# Patient Record
Sex: Male | Born: 1935 | Race: White | Hispanic: No | Marital: Married | Smoking: Former smoker
Health system: Southern US, Community
[De-identification: ages and names within clinical notes are randomized; demographics above are authoritative.]

## PROBLEM LIST (undated history)

## (undated) DIAGNOSIS — I10 Essential (primary) hypertension: Secondary | ICD-10-CM

## (undated) DIAGNOSIS — N183 Chronic kidney disease, stage 3 unspecified: Secondary | ICD-10-CM

## (undated) DIAGNOSIS — I350 Nonrheumatic aortic (valve) stenosis: Secondary | ICD-10-CM

## (undated) DIAGNOSIS — K219 Gastro-esophageal reflux disease without esophagitis: Secondary | ICD-10-CM

## (undated) DIAGNOSIS — R220 Localized swelling, mass and lump, head: Secondary | ICD-10-CM

## (undated) DIAGNOSIS — C4490 Unspecified malignant neoplasm of skin, unspecified: Secondary | ICD-10-CM

## (undated) DIAGNOSIS — I5030 Unspecified diastolic (congestive) heart failure: Secondary | ICD-10-CM

## (undated) DIAGNOSIS — I6529 Occlusion and stenosis of unspecified carotid artery: Secondary | ICD-10-CM

## (undated) DIAGNOSIS — L570 Actinic keratosis: Secondary | ICD-10-CM

## (undated) DIAGNOSIS — G459 Transient cerebral ischemic attack, unspecified: Secondary | ICD-10-CM

## (undated) DIAGNOSIS — Z8619 Personal history of other infectious and parasitic diseases: Secondary | ICD-10-CM

## (undated) DIAGNOSIS — C9110 Chronic lymphocytic leukemia of B-cell type not having achieved remission: Secondary | ICD-10-CM

## (undated) DIAGNOSIS — I2510 Atherosclerotic heart disease of native coronary artery without angina pectoris: Secondary | ICD-10-CM

## (undated) DIAGNOSIS — I639 Cerebral infarction, unspecified: Secondary | ICD-10-CM

## (undated) DIAGNOSIS — Z8673 Personal history of transient ischemic attack (TIA), and cerebral infarction without residual deficits: Secondary | ICD-10-CM

## (undated) DIAGNOSIS — D696 Thrombocytopenia, unspecified: Secondary | ICD-10-CM

## (undated) DIAGNOSIS — E785 Hyperlipidemia, unspecified: Secondary | ICD-10-CM

## (undated) HISTORY — DX: Chronic lymphocytic leukemia of B-cell type not having achieved remission: C91.10

## (undated) HISTORY — DX: Transient cerebral ischemic attack, unspecified: G45.9

## (undated) HISTORY — DX: Gastro-esophageal reflux disease without esophagitis: K21.9

## (undated) HISTORY — DX: Atherosclerotic heart disease of native coronary artery without angina pectoris: I25.10

## (undated) HISTORY — DX: Chronic kidney disease, stage 3 (moderate): N18.3

## (undated) HISTORY — DX: Essential (primary) hypertension: I10

## (undated) HISTORY — DX: Thrombocytopenia, unspecified: D69.6

## (undated) HISTORY — DX: Chronic kidney disease, stage 3 unspecified: N18.30

## (undated) HISTORY — DX: Nonrheumatic aortic (valve) stenosis: I35.0

## (undated) HISTORY — DX: Unspecified malignant neoplasm of skin, unspecified: C44.90

## (undated) HISTORY — DX: Hyperlipidemia, unspecified: E78.5

## (undated) HISTORY — DX: Personal history of other infectious and parasitic diseases: Z86.19

## (undated) HISTORY — PX: CATARACT EXTRACTION, BILATERAL: SHX1313

## (undated) HISTORY — DX: Localized swelling, mass and lump, head: R22.0

## (undated) HISTORY — DX: Cerebral infarction, unspecified: I63.9

## (undated) HISTORY — DX: Personal history of transient ischemic attack (TIA), and cerebral infarction without residual deficits: Z86.73

## (undated) HISTORY — DX: Actinic keratosis: L57.0

## (undated) HISTORY — DX: Occlusion and stenosis of unspecified carotid artery: I65.29

## (undated) HISTORY — DX: Unspecified diastolic (congestive) heart failure: I50.30

## (undated) HISTORY — PX: LITHOTRIPSY: SUR834

---
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
## 1975-04-02 HISTORY — PX: KIDNEY STONE SURGERY: SHX686

## 1987-11-29 HISTORY — PX: ESOPHAGOGASTRODUODENOSCOPY: SHX1529

## 1987-11-29 HISTORY — PX: COLONOSCOPY: SHX174

## 1996-04-01 HISTORY — PX: ANGIOPLASTY: SHX39

## 1996-04-01 HISTORY — PX: CORONARY ANGIOPLASTY: SHX604

## 2000-04-01 DIAGNOSIS — I10 Essential (primary) hypertension: Secondary | ICD-10-CM

## 2000-04-01 DIAGNOSIS — E785 Hyperlipidemia, unspecified: Secondary | ICD-10-CM

## 2000-04-01 HISTORY — DX: Hyperlipidemia, unspecified: E78.5

## 2000-04-01 HISTORY — DX: Essential (primary) hypertension: I10

## 2000-04-04 ENCOUNTER — Encounter: Payer: Self-pay | Admitting: Family Medicine

## 2000-04-04 LAB — CONVERTED CEMR LAB
Blood Glucose, Fasting: 132 mg/dL
PSA: 0.65 ng/mL

## 2000-06-12 ENCOUNTER — Encounter (INDEPENDENT_AMBULATORY_CARE_PROVIDER_SITE_OTHER): Payer: Self-pay | Admitting: Specialist

## 2000-06-12 ENCOUNTER — Ambulatory Visit (HOSPITAL_BASED_OUTPATIENT_CLINIC_OR_DEPARTMENT_OTHER): Admission: RE | Admit: 2000-06-12 | Discharge: 2000-06-12 | Payer: Self-pay | Admitting: *Deleted

## 2001-04-09 ENCOUNTER — Encounter: Payer: Self-pay | Admitting: Family Medicine

## 2001-04-09 LAB — CONVERTED CEMR LAB: Blood Glucose, Fasting: 105 mg/dL

## 2001-04-14 ENCOUNTER — Ambulatory Visit (HOSPITAL_COMMUNITY): Admission: RE | Admit: 2001-04-14 | Discharge: 2001-04-14 | Payer: Self-pay | Admitting: *Deleted

## 2001-04-17 ENCOUNTER — Encounter: Payer: Self-pay | Admitting: Cardiothoracic Surgery

## 2001-04-21 ENCOUNTER — Encounter: Payer: Self-pay | Admitting: Cardiothoracic Surgery

## 2001-04-21 ENCOUNTER — Inpatient Hospital Stay (HOSPITAL_COMMUNITY): Admission: RE | Admit: 2001-04-21 | Discharge: 2001-04-25 | Payer: Self-pay | Admitting: Cardiothoracic Surgery

## 2001-04-22 ENCOUNTER — Encounter: Payer: Self-pay | Admitting: Cardiothoracic Surgery

## 2001-04-23 ENCOUNTER — Encounter: Payer: Self-pay | Admitting: Cardiothoracic Surgery

## 2001-04-23 HISTORY — PX: CORONARY ARTERY BYPASS GRAFT: SHX141

## 2001-05-26 ENCOUNTER — Encounter (HOSPITAL_COMMUNITY): Admission: RE | Admit: 2001-05-26 | Discharge: 2001-08-24 | Payer: Self-pay | Admitting: *Deleted

## 2001-08-12 ENCOUNTER — Encounter: Payer: Self-pay | Admitting: Family Medicine

## 2001-08-12 LAB — CONVERTED CEMR LAB
PSA: 1.2 ng/mL
TSH: 1.9 microintl units/mL

## 2002-03-18 ENCOUNTER — Encounter: Payer: Self-pay | Admitting: Family Medicine

## 2002-03-18 LAB — CONVERTED CEMR LAB: Blood Glucose, Fasting: 87 mg/dL

## 2002-04-02 ENCOUNTER — Encounter: Payer: Self-pay | Admitting: Family Medicine

## 2002-04-02 LAB — CONVERTED CEMR LAB: Blood Glucose, Fasting: 92 mg/dL

## 2002-11-02 ENCOUNTER — Encounter: Payer: Self-pay | Admitting: Family Medicine

## 2002-11-02 LAB — CONVERTED CEMR LAB
Blood Glucose, Fasting: 109 mg/dL
PSA: 0.6 ng/mL
TSH: 1.9 microintl units/mL

## 2003-02-07 HISTORY — PX: COLONOSCOPY: SHX174

## 2003-10-31 ENCOUNTER — Encounter: Payer: Self-pay | Admitting: Family Medicine

## 2003-10-31 LAB — CONVERTED CEMR LAB
Blood Glucose, Fasting: 106 mg/dL
PSA: 0.4 ng/mL
TSH: 2.47 microintl units/mL

## 2004-04-30 ENCOUNTER — Ambulatory Visit: Payer: Self-pay | Admitting: Family Medicine

## 2004-04-30 LAB — CONVERTED CEMR LAB
PSA: 0.54 ng/mL
TSH: 2.82 microintl units/mL

## 2004-05-03 ENCOUNTER — Ambulatory Visit: Payer: Self-pay | Admitting: Family Medicine

## 2004-05-30 ENCOUNTER — Ambulatory Visit: Payer: Self-pay | Admitting: Family Medicine

## 2004-10-30 ENCOUNTER — Ambulatory Visit: Payer: Self-pay | Admitting: Family Medicine

## 2004-11-01 ENCOUNTER — Ambulatory Visit: Payer: Self-pay | Admitting: Family Medicine

## 2004-11-13 ENCOUNTER — Ambulatory Visit: Payer: Self-pay | Admitting: Internal Medicine

## 2004-11-26 ENCOUNTER — Ambulatory Visit: Payer: Self-pay | Admitting: Family Medicine

## 2004-12-12 ENCOUNTER — Ambulatory Visit: Payer: Self-pay | Admitting: Internal Medicine

## 2004-12-12 ENCOUNTER — Encounter (INDEPENDENT_AMBULATORY_CARE_PROVIDER_SITE_OTHER): Payer: Self-pay | Admitting: *Deleted

## 2004-12-12 HISTORY — PX: COLONOSCOPY: SHX174

## 2004-12-12 LAB — HM PAP SMEAR

## 2005-01-04 ENCOUNTER — Ambulatory Visit: Payer: Self-pay | Admitting: Family Medicine

## 2005-01-30 ENCOUNTER — Ambulatory Visit: Payer: Self-pay | Admitting: Family Medicine

## 2005-02-01 ENCOUNTER — Ambulatory Visit: Payer: Self-pay | Admitting: Family Medicine

## 2005-02-01 LAB — CONVERTED CEMR LAB: Blood Glucose, Fasting: 104 mg/dL

## 2005-04-02 ENCOUNTER — Ambulatory Visit: Payer: Self-pay | Admitting: Family Medicine

## 2005-05-02 ENCOUNTER — Ambulatory Visit: Payer: Self-pay | Admitting: Family Medicine

## 2005-11-29 ENCOUNTER — Ambulatory Visit: Payer: Self-pay | Admitting: Family Medicine

## 2005-11-29 LAB — CONVERTED CEMR LAB
Blood Glucose, Fasting: 107 mg/dL
PSA: 0.46 ng/mL
TSH: 3.34 microintl units/mL

## 2005-12-04 ENCOUNTER — Ambulatory Visit: Payer: Self-pay | Admitting: Family Medicine

## 2005-12-16 ENCOUNTER — Ambulatory Visit: Payer: Self-pay | Admitting: Family Medicine

## 2005-12-17 ENCOUNTER — Ambulatory Visit: Payer: Self-pay | Admitting: Cardiology

## 2006-01-08 ENCOUNTER — Ambulatory Visit: Payer: Self-pay | Admitting: Family Medicine

## 2006-02-06 ENCOUNTER — Ambulatory Visit: Payer: Self-pay

## 2006-08-12 ENCOUNTER — Telehealth (INDEPENDENT_AMBULATORY_CARE_PROVIDER_SITE_OTHER): Payer: Self-pay | Admitting: *Deleted

## 2006-09-19 ENCOUNTER — Ambulatory Visit: Payer: Self-pay | Admitting: Family Medicine

## 2006-10-09 ENCOUNTER — Ambulatory Visit: Payer: Self-pay | Admitting: Family Medicine

## 2006-12-03 ENCOUNTER — Ambulatory Visit: Payer: Self-pay | Admitting: Internal Medicine

## 2006-12-04 ENCOUNTER — Encounter: Payer: Self-pay | Admitting: Family Medicine

## 2006-12-04 DIAGNOSIS — K6289 Other specified diseases of anus and rectum: Secondary | ICD-10-CM | POA: Insufficient documentation

## 2006-12-04 DIAGNOSIS — I10 Essential (primary) hypertension: Secondary | ICD-10-CM | POA: Insufficient documentation

## 2006-12-04 DIAGNOSIS — I2510 Atherosclerotic heart disease of native coronary artery without angina pectoris: Secondary | ICD-10-CM | POA: Insufficient documentation

## 2006-12-04 DIAGNOSIS — K219 Gastro-esophageal reflux disease without esophagitis: Secondary | ICD-10-CM | POA: Insufficient documentation

## 2006-12-04 DIAGNOSIS — E782 Mixed hyperlipidemia: Secondary | ICD-10-CM | POA: Insufficient documentation

## 2006-12-04 DIAGNOSIS — A6000 Herpesviral infection of urogenital system, unspecified: Secondary | ICD-10-CM | POA: Insufficient documentation

## 2006-12-04 DIAGNOSIS — Z87442 Personal history of urinary calculi: Secondary | ICD-10-CM | POA: Insufficient documentation

## 2006-12-04 DIAGNOSIS — R7303 Prediabetes: Secondary | ICD-10-CM | POA: Insufficient documentation

## 2006-12-04 LAB — CONVERTED CEMR LAB: Blood Glucose, Fasting: 118 mg/dL

## 2006-12-10 HISTORY — PX: OTHER SURGICAL HISTORY: SHX169

## 2006-12-11 ENCOUNTER — Encounter: Payer: Self-pay | Admitting: Family Medicine

## 2006-12-11 ENCOUNTER — Ambulatory Visit: Payer: Self-pay

## 2006-12-12 ENCOUNTER — Ambulatory Visit: Payer: Self-pay | Admitting: Family Medicine

## 2006-12-12 LAB — CONVERTED CEMR LAB
ALT: 23 units/L (ref 0–53)
AST: 30 units/L (ref 0–37)
Albumin: 3.8 g/dL (ref 3.5–5.2)
Alkaline Phosphatase: 94 units/L (ref 39–117)
BUN: 15 mg/dL (ref 6–23)
Basophils Absolute: 0 10*3/uL (ref 0.0–0.1)
Basophils Relative: 0 % (ref 0.0–1.0)
Bilirubin, Direct: 0.2 mg/dL (ref 0.0–0.3)
CO2: 32 meq/L (ref 19–32)
Calcium: 9.5 mg/dL (ref 8.4–10.5)
Chloride: 108 meq/L (ref 96–112)
Cholesterol: 84 mg/dL (ref 0–200)
Creatinine, Ser: 1.4 mg/dL (ref 0.4–1.5)
Creatinine,U: 184.6 mg/dL
Eosinophils Absolute: 0.3 10*3/uL (ref 0.0–0.6)
Eosinophils Relative: 1.9 % (ref 0.0–5.0)
GFR calc Af Amer: 64 mL/min
GFR calc non Af Amer: 53 mL/min
Glucose, Bld: 98 mg/dL (ref 70–99)
HCT: 40 % (ref 39.0–52.0)
HDL: 22.5 mg/dL — ABNORMAL LOW (ref >39.0)
Hemoglobin: 13.9 g/dL (ref 13.0–17.0)
LDL Cholesterol: 46 mg/dL (ref 0–99)
Lymphocytes Relative: 69.1 % — ABNORMAL HIGH (ref 12.0–46.0)
MCHC: 34.8 g/dL (ref 30.0–36.0)
MCV: 95.8 fL (ref 78.0–100.0)
Microalb Creat Ratio: 9.2 mg/g (ref 0.0–30.0)
Microalb, Ur: 1.7 mg/dL (ref 0.0–1.9)
Monocytes Absolute: 1.1 10*3/uL — ABNORMAL HIGH (ref 0.2–0.7)
Monocytes Relative: 6.4 % (ref 3.0–11.0)
Neutro Abs: 3.8 10*3/uL (ref 1.4–7.7)
Neutrophils Relative %: 22.6 % — ABNORMAL LOW (ref 43.0–77.0)
PSA: 0.52 ng/mL (ref 0.10–4.00)
Platelets: 106 10*3/uL — ABNORMAL LOW (ref 150–400)
Potassium: 4.7 meq/L (ref 3.5–5.1)
RBC: 4.18 M/uL — ABNORMAL LOW (ref 4.22–5.81)
RDW: 13.5 % (ref 11.5–14.6)
Sodium: 144 meq/L (ref 135–145)
TSH: 3.17 microintl units/mL (ref 0.35–5.50)
Total Bilirubin: 1.1 mg/dL (ref 0.3–1.2)
Total CHOL/HDL Ratio: 3.7
Total Protein: 6.9 g/dL (ref 6.0–8.3)
Triglycerides: 80 mg/dL (ref 0–149)
VLDL: 16 mg/dL (ref 0–40)
WBC: 16.8 10*3/uL — ABNORMAL HIGH (ref 4.5–10.5)

## 2006-12-16 ENCOUNTER — Ambulatory Visit: Payer: Self-pay | Admitting: Family Medicine

## 2006-12-16 DIAGNOSIS — L219 Seborrheic dermatitis, unspecified: Secondary | ICD-10-CM | POA: Insufficient documentation

## 2007-01-02 ENCOUNTER — Telehealth (INDEPENDENT_AMBULATORY_CARE_PROVIDER_SITE_OTHER): Payer: Self-pay | Admitting: *Deleted

## 2007-01-05 ENCOUNTER — Encounter: Payer: Self-pay | Admitting: Family Medicine

## 2007-01-16 ENCOUNTER — Ambulatory Visit: Payer: Self-pay | Admitting: Family Medicine

## 2007-01-19 ENCOUNTER — Telehealth: Payer: Self-pay | Admitting: Family Medicine

## 2007-01-28 ENCOUNTER — Ambulatory Visit: Payer: Self-pay | Admitting: Family Medicine

## 2007-01-29 ENCOUNTER — Encounter (INDEPENDENT_AMBULATORY_CARE_PROVIDER_SITE_OTHER): Payer: Self-pay | Admitting: *Deleted

## 2007-02-05 ENCOUNTER — Ambulatory Visit: Payer: Self-pay | Admitting: Family Medicine

## 2007-03-10 ENCOUNTER — Ambulatory Visit: Payer: Self-pay | Admitting: Family Medicine

## 2007-03-11 LAB — CONVERTED CEMR LAB
Basophils Absolute: 0.1 10*3/uL (ref 0.0–0.1)
Basophils Relative: 0.3 % (ref 0.0–1.0)
Eosinophils Absolute: 0.4 10*3/uL (ref 0.0–0.6)
Eosinophils Relative: 1.7 % (ref 0.0–5.0)
HCT: 40.4 % (ref 39.0–52.0)
Hemoglobin: 14 g/dL (ref 13.0–17.0)
Lymphocytes Relative: 75.2 % — ABNORMAL HIGH (ref 12.0–46.0)
MCHC: 34.6 g/dL (ref 30.0–36.0)
MCV: 98.5 fL (ref 78.0–100.0)
Monocytes Absolute: 1.4 10*3/uL — ABNORMAL HIGH (ref 0.2–0.7)
Monocytes Relative: 6 % (ref 3.0–11.0)
Neutro Abs: 4 10*3/uL (ref 1.4–7.7)
Neutrophils Relative %: 16.8 % — ABNORMAL LOW (ref 43.0–77.0)
Platelets: 103 10*3/uL — ABNORMAL LOW (ref 150–400)
RBC: 4.1 M/uL — ABNORMAL LOW (ref 4.22–5.81)
RDW: 13.6 % (ref 11.5–14.6)
WBC: 23.7 10*3/uL (ref 4.5–10.5)

## 2007-03-18 ENCOUNTER — Ambulatory Visit: Payer: Self-pay | Admitting: Family Medicine

## 2007-03-19 ENCOUNTER — Ambulatory Visit: Payer: Self-pay | Admitting: Oncology

## 2007-04-02 DIAGNOSIS — C9110 Chronic lymphocytic leukemia of B-cell type not having achieved remission: Secondary | ICD-10-CM

## 2007-04-02 HISTORY — DX: Chronic lymphocytic leukemia of B-cell type not having achieved remission: C91.10

## 2007-04-10 ENCOUNTER — Encounter (INDEPENDENT_AMBULATORY_CARE_PROVIDER_SITE_OTHER): Payer: Self-pay | Admitting: Hematology and Oncology

## 2007-04-10 ENCOUNTER — Other Ambulatory Visit: Admission: RE | Admit: 2007-04-10 | Discharge: 2007-04-10 | Payer: Self-pay | Admitting: Hematology and Oncology

## 2007-04-10 LAB — COMPREHENSIVE METABOLIC PANEL
ALT: 20 U/L (ref 0–53)
AST: 23 U/L (ref 0–37)
Albumin: 4.5 g/dL (ref 3.5–5.2)
Alkaline Phosphatase: 109 U/L (ref 39–117)
BUN: 16 mg/dL (ref 6–23)
CO2: 29 mEq/L (ref 19–32)
Calcium: 9.7 mg/dL (ref 8.4–10.5)
Chloride: 104 mEq/L (ref 96–112)
Creatinine, Ser: 1.32 mg/dL (ref 0.40–1.50)
Glucose, Bld: 97 mg/dL (ref 70–99)
Potassium: 5 mEq/L (ref 3.5–5.3)
Sodium: 140 mEq/L (ref 135–145)
Total Bilirubin: 0.8 mg/dL (ref 0.3–1.2)
Total Protein: 7.3 g/dL (ref 6.0–8.3)

## 2007-04-10 LAB — MANUAL DIFFERENTIAL (CHCC SATELLITE)
ALC: 20.4 10*3/uL — ABNORMAL HIGH (ref 0.9–3.3)
ANC (CHCC HP manual diff): 2.4 10*3/uL (ref 1.5–6.5)
Eos: 1 % (ref 0–7)
LYMPH: 77 % — ABNORMAL HIGH (ref 14–48)
MONO: 4 % (ref 0–13)
PLT EST ~~LOC~~: DECREASED
Platelet Morphology: NORMAL
RBC Comments: NORMAL
SEG: 10 % — ABNORMAL LOW (ref 40–75)
Variant Lymph: 8 % — ABNORMAL HIGH (ref 0–0)

## 2007-04-10 LAB — CBC WITH DIFFERENTIAL (CANCER CENTER ONLY)
HCT: 42.8 % (ref 38.7–49.9)
HGB: 14.4 g/dL (ref 13.0–17.1)
MCH: 32.5 pg (ref 28.0–33.4)
MCHC: 33.7 g/dL (ref 32.0–35.9)
MCV: 96 fL (ref 82–98)
Platelets: 89 10*3/uL — ABNORMAL LOW (ref 145–400)
RBC: 4.44 10*6/uL (ref 4.20–5.70)
RDW: 12.4 % (ref 10.5–14.6)
WBC: 24 10*3/uL — ABNORMAL HIGH (ref 4.0–10.0)

## 2007-04-10 LAB — LACTATE DEHYDROGENASE: LDH: 207 U/L (ref 94–250)

## 2007-04-14 LAB — PROTEIN ELECTROPHORESIS, SERUM
Albumin ELP: 58.2 % (ref 55.8–66.1)
Alpha-1-Globulin: 4 % (ref 2.9–4.9)
Alpha-2-Globulin: 10.3 % (ref 7.1–11.8)
Beta 2: 4.4 % (ref 3.2–6.5)
Beta Globulin: 6.1 % (ref 4.7–7.2)
Gamma Globulin: 17 % (ref 11.1–18.8)
Total Protein, Serum Electrophoresis: 8.4 g/dL — ABNORMAL HIGH (ref 6.0–8.3)

## 2007-04-16 LAB — IMMUNOFIXATION ELECTROPHORESIS
IgA: 191 mg/dL (ref 68–378)
IgG (Immunoglobin G), Serum: 1420 mg/dL (ref 694–1618)
IgM, Serum: 137 mg/dL (ref 60–263)
Total Protein, Serum Electrophoresis: 8 g/dL (ref 6.0–8.3)

## 2007-04-17 LAB — ZAP-70 - CHCC SATELLITE

## 2007-04-17 LAB — FLOW CYTOMETRY - CHCC SATELLITE

## 2007-04-29 LAB — MANUAL DIFFERENTIAL (CHCC SATELLITE)
ALC: 21.4 10*3/uL — ABNORMAL HIGH (ref 0.9–3.3)
ANC (CHCC HP manual diff): 3.1 10*3/uL (ref 1.5–6.5)
Eos: 1 % (ref 0–7)
LYMPH: 74 % — ABNORMAL HIGH (ref 14–48)
MONO: 5 % (ref 0–13)
PLT EST ~~LOC~~: ADEQUATE
SEG: 12 % — ABNORMAL LOW (ref 40–75)
Variant Lymph: 8 % — ABNORMAL HIGH (ref 0–0)

## 2007-04-29 LAB — CBC WITH DIFFERENTIAL (CANCER CENTER ONLY)
HCT: 41.8 % (ref 38.7–49.9)
HGB: 14.2 g/dL (ref 13.0–17.1)
MCH: 33 pg (ref 28.0–33.4)
MCHC: 34 g/dL (ref 32.0–35.9)
MCV: 97 fL (ref 82–98)
Platelets: 143 10*3/uL — ABNORMAL LOW (ref 145–400)
RBC: 4.3 10*6/uL (ref 4.20–5.70)
RDW: 12.2 % (ref 10.5–14.6)
WBC: 26.1 10*3/uL — ABNORMAL HIGH (ref 4.0–10.0)

## 2007-05-21 ENCOUNTER — Emergency Department: Admitting: Emergency Medicine

## 2007-05-28 ENCOUNTER — Ambulatory Visit: Payer: Self-pay | Admitting: Family Medicine

## 2007-06-23 ENCOUNTER — Ambulatory Visit: Payer: Self-pay | Admitting: Oncology

## 2007-06-25 ENCOUNTER — Encounter: Payer: Self-pay | Admitting: Family Medicine

## 2007-07-10 ENCOUNTER — Encounter: Payer: Self-pay | Admitting: Family Medicine

## 2007-07-23 ENCOUNTER — Telehealth: Payer: Self-pay | Admitting: Family Medicine

## 2007-09-22 ENCOUNTER — Ambulatory Visit: Payer: Self-pay | Admitting: Oncology

## 2007-09-24 ENCOUNTER — Encounter: Payer: Self-pay | Admitting: Family Medicine

## 2007-09-24 LAB — COMPREHENSIVE METABOLIC PANEL
ALT: 25 U/L (ref 0–53)
AST: 30 U/L (ref 0–37)
Albumin: 4.5 g/dL (ref 3.5–5.2)
Alkaline Phosphatase: 105 U/L (ref 39–117)
BUN: 20 mg/dL (ref 6–23)
CO2: 27 mEq/L (ref 19–32)
Calcium: 9.8 mg/dL (ref 8.4–10.5)
Chloride: 104 mEq/L (ref 96–112)
Creatinine, Ser: 1.47 mg/dL (ref 0.40–1.50)
Glucose, Bld: 90 mg/dL (ref 70–99)
Potassium: 4.9 mEq/L (ref 3.5–5.3)
Sodium: 140 mEq/L (ref 135–145)
Total Bilirubin: 0.7 mg/dL (ref 0.3–1.2)
Total Protein: 7.1 g/dL (ref 6.0–8.3)

## 2007-09-24 LAB — CBC WITH DIFFERENTIAL (CANCER CENTER ONLY)
HCT: 39.3 % (ref 38.7–49.9)
HGB: 13.6 g/dL (ref 13.0–17.1)
MCH: 32.8 pg (ref 28.0–33.4)
MCHC: 34.5 g/dL (ref 32.0–35.9)
MCV: 95 fL (ref 82–98)
Platelets: 99 10*3/uL — ABNORMAL LOW (ref 145–400)
RBC: 4.14 10*6/uL — ABNORMAL LOW (ref 4.20–5.70)
RDW: 13 % (ref 10.5–14.6)
WBC: 28.3 10*3/uL — ABNORMAL HIGH (ref 4.0–10.0)

## 2007-09-24 LAB — MANUAL DIFFERENTIAL (CHCC SATELLITE)
ALC: 26.3 10*3/uL — ABNORMAL HIGH (ref 0.9–3.3)
ANC (CHCC HP manual diff): 0.8 10*3/uL — ABNORMAL LOW (ref 1.5–6.5)
Eos: 3 % (ref 0–7)
LYMPH: 92 % — ABNORMAL HIGH (ref 14–48)
MONO: 1 % (ref 0–13)
PLT EST ~~LOC~~: DECREASED
RBC Comments: NORMAL
SEG: 3 % — ABNORMAL LOW (ref 40–75)
Variant Lymph: 1 % — ABNORMAL HIGH (ref 0–0)

## 2007-09-24 LAB — LACTATE DEHYDROGENASE: LDH: 225 U/L (ref 94–250)

## 2007-09-25 LAB — HAPTOGLOBIN: Haptoglobin: 88 mg/dL (ref 16–200)

## 2007-09-25 LAB — DIRECT ANTIGLOBULIN TEST (NOT AT ARMC)
DAT (Complement): NEGATIVE
DAT IgG: NEGATIVE

## 2007-09-25 LAB — IGG, IGA, IGM
IgA: 149 mg/dL (ref 68–378)
IgG (Immunoglobin G), Serum: 1130 mg/dL (ref 694–1618)
IgM, Serum: 115 mg/dL (ref 60–263)

## 2007-11-04 ENCOUNTER — Ambulatory Visit: Payer: Self-pay | Admitting: Family Medicine

## 2007-11-27 ENCOUNTER — Telehealth: Payer: Self-pay | Admitting: Family Medicine

## 2007-12-17 ENCOUNTER — Ambulatory Visit: Payer: Self-pay | Admitting: Family Medicine

## 2007-12-17 LAB — CONVERTED CEMR LAB
ALT: 23 units/L (ref 0–53)
AST: 28 units/L (ref 0–37)
Albumin: 3.9 g/dL (ref 3.5–5.2)
Alkaline Phosphatase: 111 units/L (ref 39–117)
BUN: 16 mg/dL (ref 6–23)
Basophils Absolute: 0 10*3/uL (ref 0.0–0.1)
Basophils Relative: 0 % (ref 0.0–3.0)
Bilirubin, Direct: 0.1 mg/dL (ref 0.0–0.3)
CO2: 31 meq/L (ref 19–32)
Calcium: 9.7 mg/dL (ref 8.4–10.5)
Chloride: 105 meq/L (ref 96–112)
Cholesterol: 104 mg/dL (ref 0–200)
Creatinine, Ser: 1.3 mg/dL (ref 0.4–1.5)
Creatinine,U: 163 mg/dL
Eosinophils Absolute: 0.3 10*3/uL (ref 0.0–0.7)
Eosinophils Relative: 0.9 % (ref 0.0–5.0)
GFR calc Af Amer: 69 mL/min
GFR calc non Af Amer: 57 mL/min
Glucose, Bld: 132 mg/dL — ABNORMAL HIGH (ref 70–99)
HCT: 36.5 % — ABNORMAL LOW (ref 39.0–52.0)
HDL: 34.3 mg/dL — ABNORMAL LOW (ref >39.0)
Hemoglobin: 12.8 g/dL — ABNORMAL LOW (ref 13.0–17.0)
LDL Cholesterol: 51 mg/dL (ref 0–99)
Lymphocytes Relative: 75 % — ABNORMAL HIGH (ref 12.0–46.0)
MCHC: 34.9 g/dL (ref 30.0–36.0)
MCV: 97.9 fL (ref 78.0–100.0)
Microalb Creat Ratio: 3.1 mg/g (ref 0.0–30.0)
Microalb, Ur: 0.5 mg/dL (ref 0.0–1.9)
Monocytes Absolute: 1.9 10*3/uL — ABNORMAL HIGH (ref 0.1–1.0)
Monocytes Relative: 6.9 % (ref 3.0–12.0)
Neutro Abs: 4.8 10*3/uL (ref 1.4–7.7)
Neutrophils Relative %: 17.2 % — ABNORMAL LOW (ref 43.0–77.0)
PSA: 0.38 ng/mL (ref 0.10–4.00)
Platelets: 109 10*3/uL — ABNORMAL LOW (ref 150–400)
Potassium: 4.5 meq/L (ref 3.5–5.1)
RBC: 3.73 M/uL — ABNORMAL LOW (ref 4.22–5.81)
RDW: 13.4 % (ref 11.5–14.6)
Sodium: 141 meq/L (ref 135–145)
TSH: 2.11 microintl units/mL (ref 0.35–5.50)
Total Bilirubin: 1 mg/dL (ref 0.3–1.2)
Total CHOL/HDL Ratio: 3
Total Protein: 7 g/dL (ref 6.0–8.3)
Triglycerides: 92 mg/dL (ref 0–149)
VLDL: 18 mg/dL (ref 0–40)
WBC: 28.1 10*3/uL (ref 4.5–10.5)

## 2007-12-21 ENCOUNTER — Ambulatory Visit: Payer: Self-pay | Admitting: Family Medicine

## 2007-12-31 ENCOUNTER — Ambulatory Visit: Payer: Self-pay | Admitting: Family Medicine

## 2007-12-31 LAB — FECAL OCCULT BLOOD, GUAIAC: Fecal Occult Blood: POSITIVE

## 2008-01-01 LAB — CONVERTED CEMR LAB
OCCULT 1: POSITIVE
OCCULT 2: NEGATIVE
OCCULT 3: NEGATIVE

## 2008-01-14 ENCOUNTER — Ambulatory Visit: Payer: Self-pay | Admitting: Family Medicine

## 2008-01-14 LAB — CONVERTED CEMR LAB
OCCULT 1: POSITIVE
OCCULT 2: NEGATIVE
OCCULT 3: NEGATIVE

## 2008-01-18 ENCOUNTER — Ambulatory Visit: Payer: Self-pay | Admitting: Internal Medicine

## 2008-01-20 ENCOUNTER — Ambulatory Visit: Payer: Self-pay | Admitting: Family Medicine

## 2008-01-26 ENCOUNTER — Ambulatory Visit: Payer: Self-pay

## 2008-01-26 ENCOUNTER — Encounter: Payer: Self-pay | Admitting: Internal Medicine

## 2008-03-08 ENCOUNTER — Ambulatory Visit: Payer: Self-pay | Admitting: Oncology

## 2008-03-31 ENCOUNTER — Encounter: Payer: Self-pay | Admitting: Family Medicine

## 2008-03-31 LAB — MANUAL DIFFERENTIAL (CHCC SATELLITE)
ALC: 37.1 10*3/uL — ABNORMAL HIGH (ref 0.9–3.3)
ANC (CHCC HP manual diff): 4.8 10*3/uL (ref 1.5–6.5)
LYMPH: 80 % — ABNORMAL HIGH (ref 14–48)
MONO: 3 % (ref 0–13)
PLT EST ~~LOC~~: DECREASED
Platelet Morphology: NORMAL
SEG: 11 % — ABNORMAL LOW (ref 40–75)
Variant Lymph: 6 % — ABNORMAL HIGH (ref 0–0)

## 2008-03-31 LAB — CBC WITH DIFFERENTIAL (CANCER CENTER ONLY)
HCT: 40.5 % (ref 38.7–49.9)
HGB: 13.5 g/dL (ref 13.0–17.1)
MCH: 32.3 pg (ref 28.0–33.4)
MCHC: 33.4 g/dL (ref 32.0–35.9)
MCV: 97 fL (ref 82–98)
Platelets: 103 10*3/uL — ABNORMAL LOW (ref 145–400)
RBC: 4.19 10*6/uL — ABNORMAL LOW (ref 4.20–5.70)
RDW: 12.7 % (ref 10.5–14.6)
WBC: 43.2 10*3/uL — ABNORMAL HIGH (ref 4.0–10.0)

## 2008-03-31 LAB — CMP (CANCER CENTER ONLY)
ALT(SGPT): 22 U/L (ref 10–47)
AST: 33 U/L (ref 11–38)
Albumin: 3.6 g/dL (ref 3.3–5.5)
Alkaline Phosphatase: 111 U/L — ABNORMAL HIGH (ref 26–84)
BUN, Bld: 16 mg/dL (ref 7–22)
CO2: 31 mEq/L (ref 18–33)
Calcium: 9.5 mg/dL (ref 8.0–10.3)
Chloride: 101 mEq/L (ref 98–108)
Creat: 1.5 mg/dl — ABNORMAL HIGH (ref 0.6–1.2)
Glucose, Bld: 108 mg/dL (ref 73–118)
Potassium: 4.5 mEq/L (ref 3.3–4.7)
Sodium: 139 mEq/L (ref 128–145)
Total Bilirubin: 0.6 mg/dl (ref 0.20–1.60)
Total Protein: 7 g/dL (ref 6.4–8.1)

## 2008-03-31 LAB — TECHNOLOGIST REVIEW CHCC SATELLITE

## 2008-04-01 LAB — DIRECT ANTIGLOBULIN TEST (NOT AT ARMC)
DAT (Complement): NEGATIVE
DAT IgG: NEGATIVE

## 2008-04-01 LAB — COMPREHENSIVE METABOLIC PANEL
ALT: 20 U/L (ref 0–53)
AST: 24 U/L (ref 0–37)
Albumin: 4.4 g/dL (ref 3.5–5.2)
Alkaline Phosphatase: 118 U/L — ABNORMAL HIGH (ref 39–117)
BUN: 17 mg/dL (ref 6–23)
CO2: 23 mEq/L (ref 19–32)
Calcium: 9.6 mg/dL (ref 8.4–10.5)
Chloride: 104 mEq/L (ref 96–112)
Creatinine, Ser: 1.22 mg/dL (ref 0.40–1.50)
Glucose, Bld: 101 mg/dL — ABNORMAL HIGH (ref 70–99)
Potassium: 4.4 mEq/L (ref 3.5–5.3)
Sodium: 143 mEq/L (ref 135–145)
Total Bilirubin: 0.4 mg/dL (ref 0.3–1.2)
Total Protein: 7.2 g/dL (ref 6.0–8.3)

## 2008-04-01 LAB — IGG, IGA, IGM
IgA: 144 mg/dL (ref 68–378)
IgG (Immunoglobin G), Serum: 1090 mg/dL (ref 694–1618)
IgM, Serum: 108 mg/dL (ref 60–263)

## 2008-04-01 LAB — LACTATE DEHYDROGENASE: LDH: 185 U/L (ref 94–250)

## 2008-04-01 LAB — HAPTOGLOBIN: Haptoglobin: 89 mg/dL (ref 16–200)

## 2008-05-05 ENCOUNTER — Ambulatory Visit: Payer: Self-pay | Admitting: Family Medicine

## 2008-05-19 ENCOUNTER — Encounter: Payer: Self-pay | Admitting: Family Medicine

## 2008-05-27 ENCOUNTER — Encounter: Payer: Self-pay | Admitting: Family Medicine

## 2008-06-14 ENCOUNTER — Ambulatory Visit: Payer: Self-pay | Admitting: Family Medicine

## 2008-06-24 ENCOUNTER — Ambulatory Visit: Payer: Self-pay | Admitting: Oncology

## 2008-06-28 ENCOUNTER — Encounter: Payer: Self-pay | Admitting: Family Medicine

## 2008-06-28 LAB — CBC WITH DIFFERENTIAL (CANCER CENTER ONLY)
BASO#: 1.2 10*3/uL — ABNORMAL HIGH (ref 0.0–0.2)
BASO%: 4 % — ABNORMAL HIGH (ref 0.0–2.0)
EOS%: 1.2 % (ref 0.0–7.0)
Eosinophils Absolute: 0.4 10*3/uL (ref 0.0–0.5)
HCT: 39.7 % (ref 38.7–49.9)
HGB: 13.6 g/dL (ref 13.0–17.1)
LYMPH#: 20.9 10*3/uL — ABNORMAL HIGH (ref 0.9–3.3)
LYMPH%: 71.1 % — ABNORMAL HIGH (ref 14.0–48.0)
MCH: 33.4 pg (ref 28.0–33.4)
MCHC: 34.2 g/dL (ref 32.0–35.9)
MCV: 98 fL (ref 82–98)
MONO#: 2 10*3/uL — ABNORMAL HIGH (ref 0.1–0.9)
MONO%: 6.7 % (ref 0.0–13.0)
NEUT#: 5 10*3/uL (ref 1.5–6.5)
NEUT%: 17 % — ABNORMAL LOW (ref 40.0–80.0)
Platelets: 104 10*3/uL — ABNORMAL LOW (ref 145–400)
RBC: 4.07 10*6/uL — ABNORMAL LOW (ref 4.20–5.70)
RDW: 12.9 % (ref 10.5–14.6)
WBC: 29.4 10*3/uL — ABNORMAL HIGH (ref 4.0–10.0)

## 2008-06-28 LAB — CMP (CANCER CENTER ONLY)
ALT(SGPT): 27 U/L (ref 10–47)
AST: 39 U/L — ABNORMAL HIGH (ref 11–38)
Albumin: 3.6 g/dL (ref 3.3–5.5)
Alkaline Phosphatase: 104 U/L — ABNORMAL HIGH (ref 26–84)
BUN, Bld: 16 mg/dL (ref 7–22)
CO2: 28 mEq/L (ref 18–33)
Calcium: 9.5 mg/dL (ref 8.0–10.3)
Chloride: 100 mEq/L (ref 98–108)
Creat: 1.5 mg/dl — ABNORMAL HIGH (ref 0.6–1.2)
Glucose, Bld: 136 mg/dL — ABNORMAL HIGH (ref 73–118)
Potassium: 4.1 mEq/L (ref 3.3–4.7)
Sodium: 139 mEq/L (ref 128–145)
Total Bilirubin: 0.8 mg/dl (ref 0.20–1.60)
Total Protein: 7.5 g/dL (ref 6.4–8.1)

## 2008-06-28 LAB — TECHNOLOGIST REVIEW CHCC SATELLITE

## 2008-06-29 LAB — IGG, IGA, IGM
IgA: 144 mg/dL (ref 68–378)
IgG (Immunoglobin G), Serum: 1060 mg/dL (ref 694–1618)
IgM, Serum: 97 mg/dL (ref 60–263)

## 2008-06-29 LAB — DIRECT ANTIGLOBULIN TEST (NOT AT ARMC)
DAT (Complement): NEGATIVE
DAT IgG: NEGATIVE

## 2008-06-29 LAB — HAPTOGLOBIN: Haptoglobin: 83 mg/dL (ref 16–200)

## 2008-07-14 ENCOUNTER — Telehealth: Payer: Self-pay | Admitting: Family Medicine

## 2008-10-24 ENCOUNTER — Ambulatory Visit: Payer: Self-pay | Admitting: Family Medicine

## 2008-10-24 ENCOUNTER — Encounter: Admission: RE | Admit: 2008-10-24 | Discharge: 2008-10-24 | Payer: Self-pay | Admitting: Family Medicine

## 2008-10-24 DIAGNOSIS — M545 Low back pain, unspecified: Secondary | ICD-10-CM | POA: Insufficient documentation

## 2008-10-27 ENCOUNTER — Encounter: Payer: Self-pay | Admitting: Family Medicine

## 2008-11-15 ENCOUNTER — Telehealth: Payer: Self-pay | Admitting: Family Medicine

## 2008-11-27 ENCOUNTER — Encounter: Payer: Self-pay | Admitting: Family Medicine

## 2008-12-09 ENCOUNTER — Ambulatory Visit: Payer: Self-pay | Admitting: Family Medicine

## 2008-12-13 LAB — CONVERTED CEMR LAB
ALT: 31 units/L (ref 0–53)
AST: 38 units/L — ABNORMAL HIGH (ref 0–37)
Albumin: 3.9 g/dL (ref 3.5–5.2)
Alkaline Phosphatase: 94 units/L (ref 39–117)
BUN: 21 mg/dL (ref 6–23)
Basophils Absolute: 0 10*3/uL (ref 0.0–0.1)
Basophils Relative: 0 % (ref 0.0–3.0)
Bilirubin, Direct: 0.1 mg/dL (ref 0.0–0.3)
CO2: 30 meq/L (ref 19–32)
Calcium: 9.5 mg/dL (ref 8.4–10.5)
Chloride: 107 meq/L (ref 96–112)
Cholesterol: 92 mg/dL (ref 0–200)
Creatinine, Ser: 1.2 mg/dL (ref 0.4–1.5)
Creatinine,U: 143.5 mg/dL
Eosinophils Absolute: 0.5 10*3/uL (ref 0.0–0.7)
Eosinophils Relative: 1.3 % (ref 0.0–5.0)
GFR calc non Af Amer: 62.28 mL/min (ref >60)
Glucose, Bld: 93 mg/dL (ref 70–99)
HCT: 39.4 % (ref 39.0–52.0)
HDL: 27.1 mg/dL — ABNORMAL LOW (ref >39.00)
Hemoglobin: 13.2 g/dL (ref 13.0–17.0)
Hgb A1c MFr Bld: 5.8 % (ref 4.6–6.5)
LDL Cholesterol: 51 mg/dL (ref 0–99)
Lymphocytes Relative: 77 % — ABNORMAL HIGH (ref 12.0–46.0)
Lymphs Abs: 29.3 10*3/uL — ABNORMAL HIGH (ref 0.7–4.0)
MCHC: 33.5 g/dL (ref 30.0–36.0)
MCV: 104.9 fL — ABNORMAL HIGH (ref 78.0–100.0)
Microalb Creat Ratio: 3.5 mg/g (ref 0.0–30.0)
Microalb, Ur: 0.5 mg/dL (ref 0.0–1.9)
Monocytes Absolute: 3.3 10*3/uL — ABNORMAL HIGH (ref 0.1–1.0)
Monocytes Relative: 8.6 % (ref 3.0–12.0)
Neutro Abs: 5 10*3/uL (ref 1.4–7.7)
Neutrophils Relative %: 13.1 % — ABNORMAL LOW (ref 43.0–77.0)
PSA: 0.48 ng/mL (ref 0.10–4.00)
Platelets: 89 10*3/uL — ABNORMAL LOW (ref 150.0–400.0)
Potassium: 4.8 meq/L (ref 3.5–5.1)
RBC: 3.76 M/uL — ABNORMAL LOW (ref 4.22–5.81)
RDW: 13.6 % (ref 11.5–14.6)
Sodium: 141 meq/L (ref 135–145)
TSH: 2.5 microintl units/mL (ref 0.35–5.50)
Total Bilirubin: 1.2 mg/dL (ref 0.3–1.2)
Total CHOL/HDL Ratio: 3
Total Protein: 6.8 g/dL (ref 6.0–8.3)
Triglycerides: 70 mg/dL (ref 0.0–149.0)
VLDL: 14 mg/dL (ref 0.0–40.0)
WBC: 38.1 10*3/uL (ref 4.5–10.5)

## 2008-12-21 ENCOUNTER — Ambulatory Visit: Payer: Self-pay | Admitting: Internal Medicine

## 2008-12-21 ENCOUNTER — Ambulatory Visit: Payer: Self-pay | Admitting: Family Medicine

## 2008-12-21 DIAGNOSIS — R0989 Other specified symptoms and signs involving the circulatory and respiratory systems: Secondary | ICD-10-CM | POA: Insufficient documentation

## 2008-12-21 LAB — CONVERTED CEMR LAB
Bilirubin Urine: NEGATIVE
Glucose, Urine, Semiquant: NEGATIVE
Ketones, urine, test strip: NEGATIVE
Nitrite: NEGATIVE
Specific Gravity, Urine: 1.01
Urobilinogen, UA: 0.2
WBC Urine, dipstick: NEGATIVE
pH: 6

## 2009-01-03 ENCOUNTER — Ambulatory Visit: Payer: Self-pay | Admitting: Family Medicine

## 2009-01-05 ENCOUNTER — Ambulatory Visit: Payer: Self-pay

## 2009-01-05 ENCOUNTER — Encounter: Payer: Self-pay | Admitting: Internal Medicine

## 2009-01-09 ENCOUNTER — Ambulatory Visit: Payer: Self-pay | Admitting: Oncology

## 2009-01-11 ENCOUNTER — Encounter: Payer: Self-pay | Admitting: Family Medicine

## 2009-01-11 LAB — CMP (CANCER CENTER ONLY)
ALT(SGPT): 35 U/L (ref 10–47)
AST: 42 U/L — ABNORMAL HIGH (ref 11–38)
Albumin: 3.7 g/dL (ref 3.3–5.5)
Alkaline Phosphatase: 113 U/L — ABNORMAL HIGH (ref 26–84)
BUN, Bld: 17 mg/dL (ref 7–22)
CO2: 32 mEq/L (ref 18–33)
Calcium: 9.4 mg/dL (ref 8.0–10.3)
Chloride: 99 mEq/L (ref 98–108)
Creat: 1.4 mg/dl — ABNORMAL HIGH (ref 0.6–1.2)
Glucose, Bld: 167 mg/dL — ABNORMAL HIGH (ref 73–118)
Potassium: 4.7 mEq/L (ref 3.3–4.7)
Sodium: 140 mEq/L (ref 128–145)
Total Bilirubin: 0.8 mg/dl (ref 0.20–1.60)
Total Protein: 7 g/dL (ref 6.4–8.1)

## 2009-01-11 LAB — MANUAL DIFFERENTIAL (CHCC SATELLITE)
ALC: 29.3 10*3/uL — ABNORMAL HIGH (ref 0.9–3.3)
ANC (CHCC HP manual diff): 2.3 10*3/uL (ref 1.5–6.5)
BASO: 1 % (ref 0–2)
Blasts: 2 % — ABNORMAL HIGH (ref 0–0)
LYMPH: 80 % — ABNORMAL HIGH (ref 14–48)
MONO: 1 % (ref 0–13)
PLT EST ~~LOC~~: DECREASED
Platelet Morphology: NORMAL
RBC Comments: NORMAL
SEG: 7 % — ABNORMAL LOW (ref 40–75)
Variant Lymph: 9 % — ABNORMAL HIGH (ref 0–0)

## 2009-01-11 LAB — CBC WITH DIFFERENTIAL (CANCER CENTER ONLY)
HCT: 39.2 % (ref 38.7–49.9)
HGB: 13.2 g/dL (ref 13.0–17.1)
MCH: 34.3 pg — ABNORMAL HIGH (ref 28.0–33.4)
MCHC: 33.7 g/dL (ref 32.0–35.9)
MCV: 102 fL — ABNORMAL HIGH (ref 82–98)
Platelets: 87 10*3/uL — ABNORMAL LOW (ref 145–400)
RBC: 3.85 10*6/uL — ABNORMAL LOW (ref 4.20–5.70)
RDW: 12.1 % (ref 10.5–14.6)
WBC: 32.9 10*3/uL — ABNORMAL HIGH (ref 4.0–10.0)

## 2009-01-12 LAB — IGG, IGA, IGM
IgA: 141 mg/dL (ref 68–378)
IgG (Immunoglobin G), Serum: 1010 mg/dL (ref 694–1618)
IgM, Serum: 76 mg/dL (ref 60–263)

## 2009-01-12 LAB — DIRECT ANTIGLOBULIN TEST (NOT AT ARMC)
DAT (Complement): NEGATIVE
DAT IgG: NEGATIVE

## 2009-01-12 LAB — HAPTOGLOBIN: Haptoglobin: 95 mg/dL (ref 16–200)

## 2009-05-01 ENCOUNTER — Ambulatory Visit: Payer: Self-pay | Admitting: Family Medicine

## 2009-05-01 DIAGNOSIS — H5310 Unspecified subjective visual disturbances: Secondary | ICD-10-CM | POA: Insufficient documentation

## 2009-05-17 ENCOUNTER — Ambulatory Visit: Payer: Self-pay | Admitting: Family Medicine

## 2009-05-23 ENCOUNTER — Ambulatory Visit: Payer: Self-pay | Admitting: Family Medicine

## 2009-07-10 ENCOUNTER — Encounter: Payer: Self-pay | Admitting: Internal Medicine

## 2009-07-10 ENCOUNTER — Telehealth: Payer: Self-pay | Admitting: Family Medicine

## 2009-07-11 ENCOUNTER — Encounter: Payer: Self-pay | Admitting: Internal Medicine

## 2009-07-11 ENCOUNTER — Ambulatory Visit: Payer: Self-pay

## 2009-07-17 ENCOUNTER — Telehealth: Payer: Self-pay | Admitting: Family Medicine

## 2009-07-19 ENCOUNTER — Ambulatory Visit: Payer: Self-pay | Admitting: Oncology

## 2009-07-25 ENCOUNTER — Encounter: Payer: Self-pay | Admitting: Family Medicine

## 2009-07-25 LAB — CMP (CANCER CENTER ONLY)
ALT(SGPT): 22 U/L (ref 10–47)
AST: 29 U/L (ref 11–38)
Albumin: 3.6 g/dL (ref 3.3–5.5)
Alkaline Phosphatase: 117 U/L — ABNORMAL HIGH (ref 26–84)
BUN, Bld: 21 mg/dL (ref 7–22)
CO2: 30 mEq/L (ref 18–33)
Calcium: 9.7 mg/dL (ref 8.0–10.3)
Chloride: 101 mEq/L (ref 98–108)
Creat: 1.3 mg/dl — ABNORMAL HIGH (ref 0.6–1.2)
Glucose, Bld: 113 mg/dL (ref 73–118)
Potassium: 4.7 mEq/L (ref 3.3–4.7)
Sodium: 137 mEq/L (ref 128–145)
Total Bilirubin: 0.8 mg/dl (ref 0.20–1.60)
Total Protein: 7.6 g/dL (ref 6.4–8.1)

## 2009-07-25 LAB — MANUAL DIFFERENTIAL (CHCC SATELLITE)
ALC: 33.6 10*3/uL — ABNORMAL HIGH (ref 0.9–3.3)
ANC (CHCC HP manual diff): 3.4 10*3/uL (ref 1.5–6.5)
Band Neutrophils: 1 % (ref 0–10)
LYMPH: 86 % — ABNORMAL HIGH (ref 14–48)
MONO: 1 % (ref 0–13)
PLT EST ~~LOC~~: DECREASED
Platelet Morphology: NORMAL
RBC Comments: NORMAL
SEG: 8 % — ABNORMAL LOW (ref 40–75)
Variant Lymph: 4 % — ABNORMAL HIGH (ref 0–0)

## 2009-07-25 LAB — CBC WITH DIFFERENTIAL (CANCER CENTER ONLY)
HCT: 36.9 % — ABNORMAL LOW (ref 38.7–49.9)
HGB: 12.5 g/dL — ABNORMAL LOW (ref 13.0–17.1)
MCH: 32.7 pg (ref 28.0–33.4)
MCHC: 33.8 g/dL (ref 32.0–35.9)
MCV: 97 fL (ref 82–98)
Platelets: 95 10*3/uL — ABNORMAL LOW (ref 145–400)
RBC: 3.81 10*6/uL — ABNORMAL LOW (ref 4.20–5.70)
RDW: 13.1 % (ref 10.5–14.6)
WBC: 37.3 10*3/uL — ABNORMAL HIGH (ref 4.0–10.0)

## 2009-07-26 LAB — IGG, IGA, IGM
IgA: 129 mg/dL (ref 68–378)
IgG (Immunoglobin G), Serum: 1570 mg/dL (ref 694–1618)
IgM, Serum: 124 mg/dL (ref 60–263)

## 2009-07-26 LAB — DIRECT ANTIGLOBULIN TEST (NOT AT ARMC)
DAT (Complement): NEGATIVE
DAT IgG: NEGATIVE

## 2009-07-26 LAB — HAPTOGLOBIN: Haptoglobin: 120 mg/dL (ref 16–200)

## 2009-07-26 LAB — BETA 2 MICROGLOBULIN, SERUM: Beta-2 Microglobulin: 3.11 mg/L — ABNORMAL HIGH (ref 1.01–1.73)

## 2009-07-26 LAB — LACTATE DEHYDROGENASE: LDH: 199 U/L (ref 94–250)

## 2009-08-01 ENCOUNTER — Ambulatory Visit: Payer: Self-pay | Admitting: Family Medicine

## 2009-10-20 ENCOUNTER — Ambulatory Visit: Payer: Self-pay | Admitting: Oncology

## 2009-10-24 ENCOUNTER — Encounter: Payer: Self-pay | Admitting: Family Medicine

## 2009-10-24 LAB — CMP (CANCER CENTER ONLY)
ALT(SGPT): 27 U/L (ref 10–47)
AST: 26 U/L (ref 11–38)
Albumin: 4.3 g/dL (ref 3.3–5.5)
Alkaline Phosphatase: 119 U/L — ABNORMAL HIGH (ref 26–84)
BUN, Bld: 21 mg/dL (ref 7–22)
CO2: 30 mEq/L (ref 18–33)
Calcium: 9.7 mg/dL (ref 8.0–10.3)
Chloride: 101 mEq/L (ref 98–108)
Creat: 1.6 mg/dl — ABNORMAL HIGH (ref 0.6–1.2)
Glucose, Bld: 123 mg/dL — ABNORMAL HIGH (ref 73–118)
Potassium: 4.3 mEq/L (ref 3.3–4.7)
Sodium: 140 mEq/L (ref 128–145)
Total Bilirubin: 0.9 mg/dl (ref 0.20–1.60)
Total Protein: 7.4 g/dL (ref 6.4–8.1)

## 2009-10-24 LAB — CBC WITH DIFFERENTIAL (CANCER CENTER ONLY)
BASO#: 2 10*3/uL — ABNORMAL HIGH (ref 0.0–0.2)
BASO%: 4.8 % — ABNORMAL HIGH (ref 0.0–2.0)
EOS%: 1.1 % (ref 0.0–7.0)
Eosinophils Absolute: 0.5 10*3/uL (ref 0.0–0.5)
HCT: 36.5 % — ABNORMAL LOW (ref 38.7–49.9)
HGB: 12.6 g/dL — ABNORMAL LOW (ref 13.0–17.1)
LYMPH#: 31.6 10*3/uL — ABNORMAL HIGH (ref 0.9–3.3)
LYMPH%: 75.3 % — ABNORMAL HIGH (ref 14.0–48.0)
MCH: 33.8 pg — ABNORMAL HIGH (ref 28.0–33.4)
MCHC: 34.5 g/dL (ref 32.0–35.9)
MCV: 98 fL (ref 82–98)
MONO#: 3.3 10*3/uL — ABNORMAL HIGH (ref 0.1–0.9)
MONO%: 7.9 % (ref 0.0–13.0)
NEUT#: 4.6 10*3/uL (ref 1.5–6.5)
NEUT%: 10.9 % — ABNORMAL LOW (ref 40.0–80.0)
Platelets: 100 10*3/uL — ABNORMAL LOW (ref 145–400)
RBC: 3.72 10*6/uL — ABNORMAL LOW (ref 4.20–5.70)
RDW: 12.9 % (ref 10.5–14.6)
WBC: 42 10*3/uL — ABNORMAL HIGH (ref 4.0–10.0)

## 2009-10-24 LAB — TECHNOLOGIST REVIEW CHCC SATELLITE

## 2009-10-25 LAB — DIRECT ANTIGLOBULIN TEST (NOT AT ARMC)
DAT (Complement): NEGATIVE
DAT IgG: NEGATIVE

## 2009-10-25 LAB — IGG, IGA, IGM
IgA: 137 mg/dL (ref 68–378)
IgG (Immunoglobin G), Serum: 1460 mg/dL (ref 694–1618)
IgM, Serum: 105 mg/dL (ref 60–263)

## 2009-10-25 LAB — HAPTOGLOBIN: Haptoglobin: 170 mg/dL (ref 16–200)

## 2009-11-02 ENCOUNTER — Encounter (INDEPENDENT_AMBULATORY_CARE_PROVIDER_SITE_OTHER): Payer: Self-pay | Admitting: *Deleted

## 2009-11-23 ENCOUNTER — Ambulatory Visit: Payer: Self-pay | Admitting: Oncology

## 2009-11-25 ENCOUNTER — Emergency Department: Admitting: Internal Medicine

## 2009-11-27 ENCOUNTER — Ambulatory Visit: Payer: Self-pay | Admitting: Family Medicine

## 2009-11-29 ENCOUNTER — Encounter: Payer: Self-pay | Admitting: Family Medicine

## 2009-11-29 LAB — CBC WITH DIFFERENTIAL (CANCER CENTER ONLY)
HCT: 37.1 % — ABNORMAL LOW (ref 38.7–49.9)
HGB: 12.6 g/dL — ABNORMAL LOW (ref 13.0–17.1)
MCH: 33.8 pg — ABNORMAL HIGH (ref 28.0–33.4)
MCHC: 33.9 g/dL (ref 32.0–35.9)
MCV: 100 fL — ABNORMAL HIGH (ref 82–98)
Platelets: 104 10*3/uL — ABNORMAL LOW (ref 145–400)
RBC: 3.72 10*6/uL — ABNORMAL LOW (ref 4.20–5.70)
RDW: 12.7 % (ref 10.5–14.6)
WBC: 48.7 10*3/uL — ABNORMAL HIGH (ref 4.0–10.0)

## 2009-11-29 LAB — BASIC METABOLIC PANEL - CANCER CENTER ONLY
BUN, Bld: 21 mg/dL (ref 7–22)
CO2: 27 mEq/L (ref 18–33)
Calcium: 9.4 mg/dL (ref 8.0–10.3)
Chloride: 95 mEq/L — ABNORMAL LOW (ref 98–108)
Creat: 1.8 mg/dl — ABNORMAL HIGH (ref 0.6–1.2)
Glucose, Bld: 112 mg/dL (ref 73–118)
Potassium: 5.3 mEq/L — ABNORMAL HIGH (ref 3.3–4.7)
Sodium: 135 mEq/L (ref 128–145)

## 2009-11-29 LAB — MANUAL DIFFERENTIAL (CHCC SATELLITE)
ALC: 44.8 10*3/uL — ABNORMAL HIGH (ref 0.9–3.3)
ANC (CHCC HP manual diff): 2.9 10*3/uL (ref 1.5–6.5)
Band Neutrophils: 1 % (ref 0–10)
Eos: 2 % (ref 0–7)
LYMPH: 92 % — ABNORMAL HIGH (ref 14–48)
PLT EST ~~LOC~~: DECREASED
Platelet Morphology: NORMAL
RBC Comments: NORMAL
SEG: 5 % — ABNORMAL LOW (ref 40–75)

## 2009-12-01 ENCOUNTER — Ambulatory Visit: Payer: Self-pay | Admitting: Internal Medicine

## 2009-12-01 DIAGNOSIS — I959 Hypotension, unspecified: Secondary | ICD-10-CM | POA: Insufficient documentation

## 2009-12-01 DIAGNOSIS — I6529 Occlusion and stenosis of unspecified carotid artery: Secondary | ICD-10-CM | POA: Insufficient documentation

## 2009-12-27 ENCOUNTER — Telehealth (INDEPENDENT_AMBULATORY_CARE_PROVIDER_SITE_OTHER): Payer: Self-pay | Admitting: *Deleted

## 2009-12-28 ENCOUNTER — Encounter: Payer: Self-pay | Admitting: Family Medicine

## 2009-12-28 ENCOUNTER — Ambulatory Visit: Payer: Self-pay | Admitting: Family Medicine

## 2010-01-03 LAB — CONVERTED CEMR LAB
ALT: 29 units/L (ref 0–53)
AST: 33 units/L (ref 0–37)
Albumin: 4.2 g/dL (ref 3.5–5.2)
Alkaline Phosphatase: 105 units/L (ref 39–117)
BUN: 14 mg/dL (ref 6–23)
Basophils Absolute: 0 10*3/uL (ref 0.0–0.1)
Basophils Relative: 0 % (ref 0–1)
Bilirubin, Direct: 0.1 mg/dL (ref 0.0–0.3)
CO2: 29 meq/L (ref 19–32)
Calcium: 9.5 mg/dL (ref 8.4–10.5)
Chloride: 106 meq/L (ref 96–112)
Cholesterol: 103 mg/dL (ref 0–200)
Creatinine, Ser: 1.4 mg/dL (ref 0.4–1.5)
Eosinophils Absolute: 0.5 10*3/uL (ref 0.0–0.7)
Eosinophils Relative: 1 % (ref 0–5)
GFR calc non Af Amer: 51.98 mL/min (ref >60)
Glucose, Bld: 99 mg/dL (ref 70–99)
HCT: 36.8 % — ABNORMAL LOW (ref 39.0–52.0)
HDL: 35 mg/dL — ABNORMAL LOW (ref >39.00)
Hemoglobin: 12.2 g/dL — ABNORMAL LOW (ref 13.0–17.0)
LDL Cholesterol: 50 mg/dL (ref 0–99)
Lymphocytes Relative: 91 % — ABNORMAL HIGH (ref 12–46)
Lymphs Abs: 45 10*3/uL — ABNORMAL HIGH (ref 0.7–4.0)
MCHC: 33.2 g/dL (ref 30.0–36.0)
MCV: 101.4 fL — ABNORMAL HIGH (ref 78.0–100.0)
Monocytes Absolute: 1 10*3/uL (ref 0.1–1.0)
Monocytes Relative: 2 % — ABNORMAL LOW (ref 3–12)
Neutro Abs: 3 10*3/uL (ref 1.7–7.7)
Neutrophils Relative %: 6 % — ABNORMAL LOW (ref 43–77)
PSA: 0.58 ng/mL (ref 0.10–4.00)
Platelets: 107 10*3/uL — ABNORMAL LOW (ref 150–400)
Potassium: 4.2 meq/L (ref 3.5–5.1)
RBC: 3.63 M/uL — ABNORMAL LOW (ref 4.22–5.81)
RDW: 14.8 % (ref 11.5–15.5)
Sodium: 140 meq/L (ref 135–145)
Total Bilirubin: 0.7 mg/dL (ref 0.3–1.2)
Total CHOL/HDL Ratio: 3
Total Protein: 7.2 g/dL (ref 6.0–8.3)
Triglycerides: 91 mg/dL (ref 0.0–149.0)
VLDL: 18.2 mg/dL (ref 0.0–40.0)
WBC: 49.4 10*3/uL — ABNORMAL HIGH (ref 4.0–10.5)

## 2010-01-04 ENCOUNTER — Ambulatory Visit: Payer: Self-pay | Admitting: Family Medicine

## 2010-01-09 ENCOUNTER — Encounter: Payer: Self-pay | Admitting: Internal Medicine

## 2010-01-09 ENCOUNTER — Ambulatory Visit: Payer: Self-pay

## 2010-01-16 ENCOUNTER — Encounter: Payer: Self-pay | Admitting: Internal Medicine

## 2010-01-19 ENCOUNTER — Ambulatory Visit: Payer: Self-pay | Admitting: Oncology

## 2010-03-02 ENCOUNTER — Ambulatory Visit: Payer: Self-pay | Admitting: Oncology

## 2010-03-05 ENCOUNTER — Ambulatory Visit: Payer: Self-pay | Admitting: Oncology

## 2010-03-06 ENCOUNTER — Encounter: Payer: Self-pay | Admitting: Family Medicine

## 2010-03-06 LAB — CBC WITH DIFFERENTIAL/PLATELET
BASO%: 0.1 % (ref 0.0–2.0)
Basophils Absolute: 0 10*3/uL (ref 0.0–0.1)
EOS%: 0.7 % (ref 0.0–7.0)
Eosinophils Absolute: 0.3 10*3/uL (ref 0.0–0.5)
HCT: 38.9 % (ref 38.4–49.9)
HGB: 13 g/dL (ref 13.0–17.1)
LYMPH%: 78.1 % — ABNORMAL HIGH (ref 14.0–49.0)
MCH: 33.9 pg — ABNORMAL HIGH (ref 27.2–33.4)
MCHC: 33.5 g/dL (ref 32.0–36.0)
MCV: 101.1 fL — ABNORMAL HIGH (ref 79.3–98.0)
MONO#: 2.6 10*3/uL — ABNORMAL HIGH (ref 0.1–0.9)
MONO%: 6.5 % (ref 0.0–14.0)
NEUT#: 6 10*3/uL (ref 1.5–6.5)
NEUT%: 14.6 % — ABNORMAL LOW (ref 39.0–75.0)
Platelets: 142 10*3/uL (ref 140–400)
RBC: 3.85 10*6/uL — ABNORMAL LOW (ref 4.20–5.82)
RDW: 13.8 % (ref 11.0–14.6)
WBC: 41 10*3/uL — ABNORMAL HIGH (ref 4.0–10.3)
lymph#: 32 10*3/uL — ABNORMAL HIGH (ref 0.9–3.3)

## 2010-03-06 LAB — TECHNOLOGIST REVIEW

## 2010-03-07 LAB — COMPREHENSIVE METABOLIC PANEL
ALT: 18 U/L (ref 0–53)
AST: 23 U/L (ref 0–37)
Albumin: 4.5 g/dL (ref 3.5–5.2)
Alkaline Phosphatase: 124 U/L — ABNORMAL HIGH (ref 39–117)
BUN: 25 mg/dL — ABNORMAL HIGH (ref 6–23)
CO2: 26 mEq/L (ref 19–32)
Calcium: 9.9 mg/dL (ref 8.4–10.5)
Chloride: 102 mEq/L (ref 96–112)
Creatinine, Ser: 1.41 mg/dL (ref 0.40–1.50)
Glucose, Bld: 126 mg/dL — ABNORMAL HIGH (ref 70–99)
Potassium: 4.7 mEq/L (ref 3.5–5.3)
Sodium: 139 mEq/L (ref 135–145)
Total Bilirubin: 0.4 mg/dL (ref 0.3–1.2)
Total Protein: 6.7 g/dL (ref 6.0–8.3)

## 2010-03-07 LAB — DIRECT ANTIGLOBULIN TEST (NOT AT ARMC)
DAT (Complement): NEGATIVE
DAT IgG: NEGATIVE

## 2010-03-07 LAB — LACTATE DEHYDROGENASE: LDH: 183 U/L (ref 94–250)

## 2010-03-07 LAB — IGG, IGA, IGM
IgA: 143 mg/dL (ref 68–378)
IgG (Immunoglobin G), Serum: 1080 mg/dL (ref 694–1618)
IgM, Serum: 94 mg/dL (ref 60–263)

## 2010-03-07 LAB — HAPTOGLOBIN: Haptoglobin: 239 mg/dL — ABNORMAL HIGH (ref 16–200)

## 2010-05-03 NOTE — Progress Notes (Signed)
Summary: valtrex  Phone Note Refill Request Call back at Home Phone [phone] Message from:  Patient on July 10, 2009 9:22 AM  Refills Requested: Medication #1:  VALTREX 1 GM TABS 1/2 tab by mouth two times a day Patient needs written script for mail order pharmacy. Please call patient when ready for pick up.    Method Requested: Pick up at Office Initial call taken by: Melody Comas,  July 10, 2009 9:21 AM Caller: Patient Call For: Shaune Leeks MD  Follow-up for Phone Call        Patient notified that rx at front desk for pick up. Follow-up by: Melody Comas,  July 10, 2009 1:58 PM    Prescriptions: VALTREX 1 GM TABS (VALACYCLOVIR HCL) 1/2 tab by mouth two times a day  #90 x 3   Entered and Authorized by:   Shaune Leeks MD   Signed by:   Shaune Leeks MD on 07/10/2009   Method used:   Print then Give to Patient   RxID:   [id number]

## 2010-05-03 NOTE — Letter (Signed)
Summary: [facility] / OFFICE PROGRESS NOTE / DR. Konrad Dolores K  [facility] / OFFICE PROGRESS NOTE / DR. Drue Second   Imported By: Carin Primrose 04/15/2008 08:52:44  _____________________________________________________________________  External Attachment:    Type:   Image     Comment:   External Document

## 2010-05-03 NOTE — Assessment & Plan Note (Signed)
Summary: COUGH   Vital Signs:  Patient profile:   77 year old male Height:      69.50 inches Weight:      169.38 pounds BMI:     24.74 Temp:     98.7 degrees F oral Pulse rate:   88 / minute Pulse rhythm:   regular BP sitting:   142 / 70  (left arm) Cuff size:   regular  Vitals Entered By: Delilah Shan CMA Duncan Dull) (May 17, 2009 10:32 AM) CC: Cough   History of Present Illness: 77 yo with 1 1/2 weeks of URI symptoms. Started with runny nose, sinus congestion. Now has dry cough, can hear wheezing at night.   Taking 4 to 6 Mucous Relief Tablets per day for the last few days with no relief of symptoms. Feels like symptoms are getting worse, not better. No fevers, did feel like he had chills the other night. No shortness of breath, except when coughing.  No sore throat, no ear pain.  No n/v/d.  Current Medications (verified): 1)  Valtrex 1 Gm Tabs (Valacyclovir Hcl) .... 1/2 Tab By Mouth Two Times A Day 2)  Benadryl 25 Mg  Caps (Diphenhydramine Hcl) .[name].. 1 By Mouth At Bedtime 3)  Lisinopril 20 Mg  Tabs (Lisinopril) .[name].. 1 By Mouth Daily 4)  Fish Oil 1000 Mg  Caps (Omega-3 Fatty Acids) .... Take 2 - 3 Every Day 5)  Simvastatin 20 Mg Tabs (Simvastatin) .... One Tab By Mouth At Night 6)  Carvedilol 6.25 Mg  Tabs (Carvedilol) .[name].. 1 Two Times A Day 7)  Bayer Low Strength 81 Mg Tbec (Aspirin) .[name].. 1 Daily By Mouth 8)  Cvs Suppository (Adult) 3 Gm Supp (Glycerin (Laxative)) .... One Supp Per Rectum As Needed For Hard Stools. 9)  Azithromycin 250 Mg  Tabs (Azithromycin) .... 2 By  Mouth Today and Then 1 Daily For 4 Days 10)  Tessalon Perles 100 Mg  Caps (Benzonatate) .[name].. 1 Tab By Mouth Three Times A Day As Needed Cough  Allergies (verified): No Known Drug Allergies  Review of Systems      See HPI General:  Complains of chills; denies fever. Eyes:  Denies blurring. CV:  Denies chest pain or discomfort. Resp:  Complains of cough and wheezing; denies coughing up blood,  shortness of breath, and sputum productive. GI:  Denies abdominal pain, diarrhea, nausea, and vomiting. Derm:  Denies rash.  Physical Exam  General:  Well-developed,well-nourished,in no acute distress; alert,appropriate and cooperative throughout examination VS reviewed- afebrile, mildly hypertensive Ears:  TMs retracted bilaterally. Nose:  no mucosal edema, no airflow obstruction, and mucosal erythema.  sinuses neg Mouth:  no exudates and pharyngeal erythema.   Lungs:  Normal respiratory effort, chest expands symmetrically. Scattered exp wheeze, no crackles. Heart:  Normal rate and regular rhythm. S1 and S2 normal without gallop, murmur, click, rub or other extra sounds. Extremities:  No clubbing, cyanosis, edema, or deformity noted with normal full range of motion of all joints.   Skin:  Intact without suspicious lesions or rashes Psych:  Cognition and judgment appear intact. Alert and cooperative with normal attention span and concentration. No apparent delusions, illusions, hallucinations   Impression & Recommendations:  Problem # 1:  ACUTE BRONCHITIS (ICD-466.0) Assessment New  Given duration of symptoms, will treat with Zpack. Continue supportive care. See pt instructions. His updated medication list for this problem includes:    Azithromycin 250 Mg Tabs (Azithromycin) .[name]... 2 by  mouth today and then 1 daily for  4 days    Tessalon Perles 100 Mg Caps (Benzonatate) .[name]... 1 tab by mouth three times a day as needed cough  Orders: Prescription Created Electronically [phone])  Complete Medication List: 1)  Valtrex 1 Gm Tabs (Valacyclovir hcl) .... 1/2 tab by mouth two times a day 2)  Benadryl 25 Mg Caps (Diphenhydramine hcl) .[name].. 1 by mouth at bedtime 3)  Lisinopril 20 Mg Tabs (Lisinopril) .[name].. 1 by mouth daily 4)  Fish Oil 1000 Mg Caps (Omega-3 fatty acids) .... Take 2 - 3 every day 5)  Simvastatin 20 Mg Tabs (Simvastatin) .... One tab by mouth at night 6)  Carvedilol 6.25 Mg  Tabs (Carvedilol) .[name].. 1 two times a day 7)  Bayer Low Strength 81 Mg Tbec (Aspirin) .[name].. 1 daily by mouth 8)  Cvs Suppository (adult) 3 Gm Supp (Glycerin (laxative)) .... One supp per rectum as needed for hard stools. 9)  Azithromycin 250 Mg Tabs (Azithromycin) .... 2 by  mouth today and then 1 daily for 4 days 10)  Tessalon Perles 100 Mg Caps (Benzonatate) .[name].. 1 tab by mouth three times a day as needed cough  Patient Instructions: 1)  Take Zpack as directed.  Drink lots of fluids.  Continue taking Mucinex, nasal saline irrigation, and Tylenol/Ibuprofen. Tessalon Perles as needed for cough.  Call if no improvement in 5-7 days, sooner if increasing cough, fever, or new symptoms ( shortness of breath, chest pain) .  Prescriptions: TESSALON PERLES 100 MG  CAPS (BENZONATATE) 1 tab by mouth three times a day as needed cough  #30 x 0   Entered and Authorized by:   Ruthe Mannan MD   Signed by:   Ruthe Mannan MD on 05/17/2009   Method used:   Electronically to        [facility] [phone]* (retail)       [address] [place], [place]  [address]       Ph: [phone] or [phone]       Fax: [phone]   RxID:   [id number] AZITHROMYCIN 250 MG  TABS (AZITHROMYCIN) 2 by  mouth today and then 1 daily for 4 days  #6 x 0   Entered and Authorized by:   Ruthe Mannan MD   Signed by:   Ruthe Mannan MD on 05/17/2009   Method used:   Electronically to        [facility] [phone]* (retail)       [address] [place], [place]  [address]       Ph: [phone] or [phone]       Fax: [phone]   RxID:   [id number]   Current Allergies (reviewed today): No known allergies

## 2010-05-03 NOTE — Assessment & Plan Note (Signed)
Summary: 1 yr f/u MH   Visit Type:  Follow-up Primary Provider:  Shaune Leeks MD  CC:  no complaints.  History of Present Illness: Jimmy [name] is a delightful 77 year old male with a history of coronary artery disease, status post bypass surgery in 2003. He had a Myoview in September 2008, which showed ejection fraction of 63% with no ischemia or infarct.  He also has a history of hyperlipidemia, hypertension, and minimal bilateral renal artery stenosis.   Echo 10/09 EF 55-65% mild diastolic dysfunction  He returns today for routine followup. Doing well. Remains very active. Exercise 5 days /week at [facility]. Rides bike, treadmill, crunches, etc. No CP or SOB.  BP well controlled.  Recently having sciatic pain but resolved with rehab.  Recent lipids:  TC  92 HDL 27  LDL 51 TG 70     Current Medications (verified): 1)  Valtrex 1 Gm Tabs (Valacyclovir Hcl) .... 1/2 Tab By Mouth Two Times A Day 2)  Benadryl 25 Mg  Caps (Diphenhydramine Hcl) .[name].. 1 By Mouth At Bedtime 3)  Lisinopril 20 Mg  Tabs (Lisinopril) .[name].. 1 By Mouth Daily 4)  Fish Oil 1000 Mg  Caps (Omega-3 Fatty Acids) .... Take 2 - 3 Every Day 5)  Simvastatin 20 Mg Tabs (Simvastatin) .... One Tab By Mouth At Night 6)  Carvedilol 6.25 Mg  Tabs (Carvedilol) .[name].. 1 Two Times A Day 7)  Bayer Low Strength 81 Mg Tbec (Aspirin) .[name].. 1 Daily By Mouth 8)  Cvs Suppository (Adult) 3 Gm Supp (Glycerin (Laxative)) .... One Supp Per Rectum As Needed For Hard Stools.  Allergies (verified): No Known Drug Allergies  Past History:  Past Medical History: Coronary artery disease :(S/P CABG X 4 04/2001)    Myoview 2008 EF 63% normal perfusion GERD:(1990'S) Hyperlipidemia:(2002) Hypertension:(2002) CLL  Family History: Reviewed history from 12/04/2006 and no changes required. Father: DECEASED 33 YOA +HTN, NATURAL CAUSES BEDRIDDEN X 2 YEARS  Mother: DECEASED 12 YOA "HEART" , + HTN Siblings: 1 BROTHER DECEASED 63YOA ETOH DISEASE  --FAILURE                1 BROTHER DECEASED 55 YOA MI, STROKE                 1 BROTHER DECEASED 27YOA DROWNING                 1  1/2 BROTHER SHOT TO DEATH  1 SISTER DECEASED 29 YOA RENAL CELL CANCER CV: + MOTHER, +BROTHER HBP: +MOTHER, + FATHER DM: + SISTER, SISTERS DAUGHTER GOUT/ARTHRITIS: BREAST/OVARIAN/UTERINE CANCER:: + SISTER RENAL CELL CANCER COLON CANCER: DEPRESSION: + DAUGHTER (BIPOLAR) ETOH: +BROTHER OTHER: + STROKE BROTHER  Review of Systems       As per HPI and past medical history; otherwise all systems negative.   Vital Signs:  Patient profile:   77 year old male Height:      69.50 inches Weight:      168.25 pounds BMI:     24.58 Pulse rate:   62 / minute Pulse rhythm:   regular BP sitting:   128 / 62  (left arm) Cuff size:   regular  Vitals Entered By: Mercer Pod (December 21, 2008 2:15 PM)  Physical Exam  General:  Gen: well appearing. no resp difficulty HEENT: normal Neck: supple. no JVD. Carotids 2+ bilat; soft R bruit. No lymphadenopathy or thryomegaly appreciated. Cor: PMI nondisplaced. Regular rate & rhythm. No rubs, gallops, murmur. Lungs: clear Abdomen: soft, nontender, nondistended. No  bruits or masses. Good bowel sounds. Extremities: no cyanosis, clubbing, rash, edema Neuro: alert & orientedx3, cranial nerves grossly intact. moves all 4 extremities w/o difficulty. affect pleasant    Impression & Recommendations:  Problem # 1:  CORONARY ARTERY DISEASE (ICD-414.00) Very active. No evidence of ischemia. Continue current regimen.  Problem # 2:  HYPERTENSION (ICD-401.9) Well controlled.  Problem # 3:  CAROTID BRUIT, RIGHT (ICD-785.9) Check carotid u/s.  Problem # 4:  HYPERLIPIDEMIA (ICD-272.4) LDL at goal (<70). HDL remains low. Continue exercise and fish oil.   Other Orders: Carotid Duplex (Carotid Duplex) EKG w/ Interpretation (93000)  Patient Instructions: 1)  Your physician recommends that you schedule a follow-up  appointment in: 1 yr 2)  Your physician has requested that you have a carotid duplex. This test is an ultrasound of the carotid arteries in your neck. It looks at blood flow through these arteries that supply the brain with blood. Allow one hour for this exam. There are no restrictions or special instructions.  Appended Document: 1 yr f/u MH U/A Micro Nml.   Clinical Lists Changes  Orders: Added new Service order of UA Dipstick W/ Micro (manual) (75643) - Signed

## 2010-05-03 NOTE — Assessment & Plan Note (Signed)
Summary: HIP PAIN/CLE   Vital Signs:  Patient profile:   77 year old male Height:      69.50 inches Weight:      166.0 pounds BMI:     24.25 Temp:     97.9 degrees F oral Pulse rate:   72 / minute Pulse rhythm:   regular BP sitting:   110 / 70  (left arm) Cuff size:   regular  Vitals Entered By: Benny Lennert CMA (October 24, 2008 10:49 AM)  History of Present Illness: Chief complaint hip pain  77 yo male with CLL presents with hip pain:  Has been having right posteior buttock pain, pain with sitting up and standing down.  Is radiating down his leg some.  Radiculopathy has improved. Last session was last friday - has been going to the chiropractor  Down to the calf.  Has had some low back pain in the past, but this is different than that.     Back Pain History:      The patient's back pain has been present for < 6 weeks.  The pain is located in the lower back region and does radiate below the knees.  He states that he has had a prior history of back pain.  The patient has not had any recent physical therapy for his back pain.  The following makes the back pain better: manipulation, but only for a short time.    Critical Exclusionary Diagnosis Criteria (CEDC) for Back Pain:      The patient denies a history of previous trauma.  He has no prior history of spinal surgery.  There are no symptoms to suggest infection, cauda equina, or psychosocial factors for back pain.  Cancer risk factors include age >50 yrs with new back pain and past medical history of cancer.  Other positive CEDC factors include low back pain worse with activity.    Allergies (verified): No Known Drug Allergies  Past History:  Past medical, surgical, family and social histories (including risk factors) reviewed, and no changes noted (except as noted below).  Past Medical History: Coronary artery disease :(S/P CABG X 4 04/2001) WJXB:(1478'G) Hyperlipidemia:(2002) Hypertension:(2002) CLL  Past Surgical  History: Reviewed history from 12/21/2007 and no changes required. HEP A ([facility], [place]) 1954 HEMMORHOIDECTOMY/ FISSURE REPAIOR ([place]) 1954 KIDNEY STONE REMOVAL (DR BATES) 1977 LITHOTRIPSY MULTIP 1990s ANGIOPLASTY :(1998) CATHERIZATION WITH ANGIOPLASTY:(1998) CABG X4 (DR. GEARHART):(04/23/2001) EGD; GASTRITIS :(11/29/1987) COLONOSCOPYNORMAL:(11/29/1987) COLONOSCOPY; HEMM. INTERNAL ,FOCAL PROCTITIS ; NEGATIVE BIOPSY:(02/07/2003) COLONOSCOPY; INTERNAL EXTERNAL HEMORRHOIDS ;+PROCTITIS , NEGATIVE BIOPSY:(12/12/2004) ETT/Persantine Myoview nml ( 12/10/2006) Cataracts R 1/09  L 8/09  Family History: Reviewed history from 12/04/2006 and no changes required. Father: DECEASED 57 YOA +HTN, NATURAL CAUSES BEDRIDDEN X 2 YEARS  Mother: DECEASED 42 YOA "HEART" , + HTN Siblings: 1 BROTHER DECEASED 63YOA ETOH DISEASE --FAILURE                1 BROTHER DECEASED 55 YOA MI, STROKE                 1 BROTHER DECEASED 27YOA DROWNING                 1  1/2 BROTHER SHOT TO DEATH  1 SISTER DECEASED 12 YOA RENAL CELL CANCER CV: + MOTHER, +BROTHER HBP: +MOTHER, + FATHER DM: + SISTER, SISTERS DAUGHTER GOUT/ARTHRITIS: BREAST/OVARIAN/UTERINE CANCER:: + SISTER RENAL CELL CANCER COLON CANCER: DEPRESSION: + DAUGHTER (BIPOLAR) ETOH: +BROTHER OTHER: + STROKE BROTHER  Social History: Reviewed history from 12/04/2006 and  no changes required. Marital Status: Married LIVES WITH WIFE Children:  1 SON DIED LUNG CANCER 40 YOA// 1 DAUGHTER BIPOLAR LIVES   ETOH  Occupation: RETIRED MACHINIST:   Review of Systems       REVIEW OF SYSTEMS  GEN: No systemic complaints, no fevers, chills, sweats, or other acute illnesses MSK: Detailed in the HPI GI: tolerating PO intake without difficulty Neuro: No numbness, + PARASTHESIAS Otherwise the pertinent positives of the ROS are noted above.    Physical Exam  General:  GEN: Well-developed,well-nourished,in no acute distress; alert,appropriate and cooperative  throughout examination HEENT: Normocephalic and atraumatic without obvious abnormalities. No apparent alopecia or balding. Ears, externally no deformities PULM: Breathing comfortably in no respiratory distress EXT: No clubbing, cyanosis, or edema PSYCH: Normally interactive. Cooperative during the interview. Pleasant. Friendly and conversant. Not anxious or depressed appearing. Normal, full affect.  Msk:  Normal Greater trochanteric bursae Full hip ROM Negative Pearlean Brownie pos  Reverse Faber Sciatic Notches: mildly ttp Sensation to Gross touch WNL DTR 2+ knee and ankle no clonus DP and PT pulses are normal B  piriformis ttp  Low Back Pain Physical Exam:    Inspection-deformity:     No    Palpation-spinal tenderness:   No    Motor Exam/Strength:         Left Ankle Dorsiflexion (L5,L4):     normal       Left Great Toe Dorsiflexion (L5,L4):     normal       Left Heel Walk (L5,some L4):     normal       Left Single Squat & Rise-Quads (L4):   normal       Left Toe Walk-calf (S1):       normal       Right Ankle Dorsiflexion (L5,L4):     normal       Right Great Toe Dorsiflexion (L5,L4):       normal       Right Heel Walk (L5,some L4):     normal       Right Single Squat & Rise Quads (L4):   normal       Right Toe Walk-calf (S1):       normal    Sensory Exam/Pinprick:        Left Medial Foot (L4):   normal       Left Dorsal Foot (L5):   normal       Left Lateral Foot (S1):   normal       Right Medial Foot (L4):   normal       Right Dorsal Foot (L5):   normal       Right Lateral Foot (S1):   normal    Reflexes:        Left Knee Jerk (L4):     normal       Left Ankle Reflex (S1):   normal       Right Knee Jerk:     normal       Right Ankle Reflex (S1):   normal    Straight Leg Raise (SLR):       Left Straight Leg Raise (SLR):   negative       Right Straight Leg Raise (SLR):   negative   Impression & Recommendations:  Problem # 1:  SCIATICA, RIGHT (ICD-724.3) Assessment New >25  minutes spent in total face to face time with the patient with >50% of time spent in counselling and coordination of care:  I reviewed the relevent anatomy with the patient using Netter's Orthopaedic Atlas of Human Anatomy. The patient indicated that they understood the involved structures.   Sciatica - from spine or piriformis PT for McKenzie protocol, formal rehab, HEP, core and pelvic stability Age, h/o CLL, check spine films  Avoid NSAIDS in elderly   His updated medication list for this problem includes:    Bayer Low Strength 81 Mg Tbec (Aspirin) .[name]... 1 daily by mouth  Orders: Physical Therapy Referral (PT)  Problem # 2:  BACK PAIN, LUMBAR (ICD-724.2) Assessment: New  His updated medication list for this problem includes:    Bayer Low Strength 81 Mg Tbec (Aspirin) .[name]... 1 daily by mouth  Orders: Radiology Referral (Radiology) Physical Therapy Referral (PT)  Complete Medication List: 1)  Valtrex 1 Gm Tabs (Valacyclovir hcl) .... 1/2 tab by mouth two times a day 2)  Benadryl 25 Mg Caps (Diphenhydramine hcl) .[name].. 1 by mouth at bedtime 3)  Lisinopril 20 Mg Tabs (Lisinopril) .[name].. 1 by mouth daily 4)  Fish Oil 1000 Mg Caps (Omega-3 fatty acids) .... Take 2 - 3 every day 5)  Simvastatin 20 Mg Tabs (Simvastatin) .... One tab by mouth at night 6)  Carvedilol 6.25 Mg Tabs (Carvedilol) .[name].. 1 two times a day 7)  Bayer Low Strength 81 Mg Tbec (Aspirin) .[name].. 1 daily by mouth 8)  Anusol-hc 25 Mg Supp (Hydrocortisone acetate) .... One pr three times a day as needed 9)  Diltiazem Hcl Powd (Diltiazem hcl) .... 2% apply cream twice a day 10)  Levitra 20 Mg Tabs (Vardenafil hcl) .... One tab by mouth one hour prior to desired intercourse. 11)  Cvs Suppository (adult) 3 Gm Supp (Glycerin (laxative)) .... One supp per rectum as needed for hard stools.  Patient Instructions: 1)  Referral Appointment Information 2)  Day/Date: 3)  Time: 4)  Place/MD: 5)  Address: 6)  Phone/Fax: 7)  Patient  given appointment information. Information/Orders faxed/mailed.  8)  f/u 1 month if not better  Current Allergies (reviewed today): No known allergies

## 2010-05-03 NOTE — Letter (Signed)
[facility]   Im[facility] By: Lanelle Bal 11/01/2009 09:06:37  _____________________________________________________________________  External Attachment:    Type:   Image     Comment:   External Document  Appended Document: [facility]    Clinical Lists Changes  Observations: Added new observation of PAST MED HX: Coronary artery disease :(S/P CABG X 4 04/2001)    Myoview 2008 EF 63% normal perfusion GERD:(1990'S) Hyperlipidemia:(2002) Hypertension:(2002) CLL- Dr. Welton Flakes  (11/02/2009 22:39)       Past History:  Past Medical History: Coronary artery disease :(S/P CABG X 4 04/2001)    Myoview 2008 EF 63% normal perfusion GERD:(1990'S) Hyperlipidemia:(2002) Hypertension:(2002) CLL- Dr. Welton Flakes

## 2010-05-03 NOTE — Letter (Signed)
Summary: [facility]  [facility]   Imported By: Lanelle Bal 12/13/2009 09:11:57  _____________________________________________________________________  External Attachment:    Type:   Image     Comment:   External Document  Appended Document: [facility]   Past History:  Past Medical History: Coronary artery disease :(S/P CABG X 4 04/2001)    Myoview 2008 EF 63% normal perfusion GERD:(1990'S) Hyperlipidemia:(2002) Hypertension:(2002) CLL and thrombocytopenia- Dr. Welton Flakes

## 2010-05-03 NOTE — Progress Notes (Signed)
Summary: needs written scripts  Phone Note Call from Patient Call back at [phone]   Caller: Patient Call For: dr Danille Oppedisano Summary of Call: pt needs 90 day written scripts for lisinopril 20 mg, simvastatin 20 mg, carvedilol 6.25 mg two times a day, please call when ready Initial call taken by: Lowella Petties,  November 27, 2007 11:35 AM  Follow-up for Phone Call        pt to pick up Follow-up by: Lowella Petties,  November 30, 2007 8:15 AM    New/Updated Medications: SIMVASTATIN 20 MG TABS (SIMVASTATIN) one tab by mouth at night   Prescriptions: SIMVASTATIN 20 MG TABS (SIMVASTATIN) one tab by mouth at night  #90 x 3   Entered and Authorized by:   Shaune Leeks MD   Signed by:   Shaune Leeks MD on 11/30/2007   Method used:   Print then Give to Patient   RxID:   [id number] CARVEDILOL 6.25 MG  TABS (CARVEDILOL) 1 two times a day  #180 x 3   Entered and Authorized by:   Shaune Leeks MD   Signed by:   Shaune Leeks MD on 11/30/2007   Method used:   Print then Give to Patient   RxID:   [id number] LISINOPRIL 20 MG  TABS (LISINOPRIL) 1 by mouth daily  #90 x 3   Entered and Authorized by:   Shaune Leeks MD   Signed by:   Shaune Leeks MD on 11/30/2007   Method used:   Print then Give to Patient   RxID:   [id number]

## 2010-05-03 NOTE — Letter (Signed)
Summary: [facility] / CLL / DR. Drue Second  [facility] / CLL / DR. Drue Second   Imported By: Carin Primrose 07/26/2008 08:19:09  _____________________________________________________________________  External Attachment:    Type:   Image     Comment:   External Document

## 2010-05-03 NOTE — Letter (Signed)
[facility]   Imported By: Lanelle Bal 08/31/2009 12:29:54  _____________________________________________________________________  External Attachment:    Type:   Image     Comment:   External Document

## 2010-05-03 NOTE — Letter (Signed)
Summary: [facility]/Dr. Yetta Barre   Imported By: Eleonore Chiquito 06/06/2008 16:11:55  _____________________________________________________________________  External Attachment:    Type:   Image     Comment:   External Document

## 2010-05-03 NOTE — Miscellaneous (Signed)
Summary: Consent for removal of lesion on scalpl  Consent for removal of lesion on scalpl   Imported By: Beau Fanny 01/03/2009 15:01:16  _____________________________________________________________________  External Attachment:    Type:   Image     Comment:   External Document

## 2010-05-03 NOTE — Assessment & Plan Note (Signed)
Summary: Jimmy [name] shingles shot/rbh  Nurse Visit   Prior Medications: VALTREX 1 GM TABS (VALACYCLOVIR HCL) 1/2 tab by mouth two times a day     Orders Added: 1)  Zoster (Shingles) Vaccine Live [90736] 2)  Admin 1st Vaccine [16109]     Other Immunization History:    Zostavax # 1:  Zostavax (09/19/2006)   Zostavax # 1    Vaccine Type: Zostavax    Site: left deltoid    Mfr: Merck    Dose: 0.5 ml    Route: [place]    Given by: Providence Crosby    Exp. Date: 10/24/2007    Lot #: [id number]    VIS given: 01/11/05 given September 19, 2006.

## 2010-05-03 NOTE — Progress Notes (Signed)
Summary: 90 day rx (hold for tasha)  Phone Note Call from Patient Call back at [phone]   Caller: Patient Call For: schaller Summary of Call: valotrex 1 mg tab 90x3 for [facility]. Initial call taken by: Liane Comber,  Aug 12, 2006 9:42 AM  Follow-up for Phone Call        printed and on chart. Follow-up by: Shaune Leeks MD,  Aug 12, 2006 10:59 AM  New/Updated Medications: VALTREX 1 GM TABS (VALACYCLOVIR HCL) 1/2 tab by mouth two times a day  New/Updated Medications: VALTREX 1 GM TABS (VALACYCLOVIR HCL) 1/2 tab by mouth two times a day  Prescriptions: VALTREX 1 GM TABS (VALACYCLOVIR HCL) 1/2 tab by mouth two times a day  #90 x 3   Entered and Authorized by:   Shaune Leeks MD   Signed by:   Shaune Leeks MD on 08/12/2006   Method used:   Print then Give to Patient   RxID:   [id number]

## 2010-05-03 NOTE — Miscellaneous (Signed)
Summary: Consent for removal of tick from right shoulder  Consent for removal of tick from right shoulder   Imported By: Beau Fanny 08/01/2009 13:57:04  _____________________________________________________________________  External Attachment:    Type:   Image     Comment:   External Document

## 2010-05-03 NOTE — Assessment & Plan Note (Signed)
Summary: OV   Vital Signs:  Patient Profile:   77 Years Old Male Weight:      167 pounds Temp:     98.4 degrees F oral Pulse rate:   64 / minute Pulse rhythm:   regular BP sitting:   120 / 72  (left arm) Cuff size:   regular  Vitals Entered By: Delilah Shan (October 09, 2006 11:47 AM)                Chief Complaint:  ? gas, pain under ribs, and burping.  History of Present Illness: Dr Samule Ohm did Renal Artery U/S which was done in Feb, nml. Here today for pain in R lat flank at mid axilllary costal margin, started two days ago during and woke him up both nights since, worse last night.  He thinks it is gas. He took Beano last night and yesterday morning with dubious results. Still has pain today.  Had bad diarrhea starting last Thurs with improvement throuhout the weekend, resolution on Tuesday..has lost three pounds. No unusual exercise to cause muscle strain/pull. No SOB except hard to take deep breath because pain is more prevalent then.  Last night, pain 7-8/10(as bad as kidney stone), now no pain unlkess takes deep breath.  Current Allergies: No known allergies      Review of Systems       see HPI   Physical Exam  General:     Well-developed,well-nourished,in no acute distress; alert,appropriate and cooperative throughout examination Head:     Normocephalic and atraumatic without obvious abnormalities. No apparent alopecia or balding. Eyes:     Conjunctiva clear bilaterally.  Ears:     External ear exam shows no significant lesions or deformities.  Otoscopic examination reveals clear canals, tympanic membranes are intact bilaterally without bulging, retraction, inflammation or discharge. Hearing is grossly normal bilaterally. Nose:     External nasal examination shows no deformity or inflammation. Nasal mucosa are pink and moist without lesions or exudates. Mouth:     Oral mucosa and oropharynx without lesions or exudates.  Teeth in good repair. Neck:     No  deformities, masses, or tenderness noted. Chest Wall:     No deformities, masses, tenderness or gynecomastia noted. Lungs:     Normal respiratory effort, chest expands symmetrically. Lungs are clear to auscultation, no crackles or wheezes. Heart:     Normal rate and regular rhythm. S1 and S2 normal without gallop, murmur, click, rub or other extra sounds. Abdomen:     Bowel sounds positive,abdomen  without masses, organomegaly or hernias noted. Tympany in RUQ and flank with mild discomfort in same area.    Impression & Recommendations:  Problem # 1:  FLANK PAIN, RIGHT UPPER (ICD-789.09)  His updated medication list for this problem includes:    Bayer Aspirin 325 Mg Tabs (Aspirin) .[name]... 1 by mouth daily  Orders: CXR- 2view (CXR) Gas in bowel w/o specific pattern  Orders: CXR- 2view (CXR)   Medications Added to Medication List This Visit: 1)  Benadryl 25 Mg Caps (Diphenhydramine hcl) .[name].. 1 by mouth at bedtime 2)  Bayer Aspirin 325 Mg Tabs (Aspirin) .[name].. 1 by mouth daily 3)  Atenolol 25 Mg Tabs (Atenolol) .[name].. 1 by mouth two times a day 4)  Lisinopril 10 Mg Tabs (Lisinopril) .[name].. 1 by mouth once daily 5)  Fish Oil Oil (Fish oil) .... Two times a day 6)  Zocor 20 Mg Tabs (Simvastatin) .[name].. 1 by mouth at bedtime   Patient  Instructions: 1)  Avoid milk and milk products 2)  Since already taking Mylanta routinely, try Gasex.  3)  RTC/Call if sxs worsen. 4)  Prilosec prior to brfst for two weeks, add before supper if no improvement. Will refer to GI if no help.        Prior Medications: VALTREX 1 GM TABS (VALACYCLOVIR HCL) 1/2 tab by mouth two times a day BENADRYL 25 MG  CAPS (DIPHENHYDRAMINE HCL) 1 by mouth at bedtime BAYER ASPIRIN 325 MG  TABS (ASPIRIN) 1 by mouth daily ATENOLOL 25 MG  TABS (ATENOLOL) 1 by mouth two times a day LISINOPRIL 10 MG  TABS (LISINOPRIL) 1 by mouth once daily FISH OIL   OIL (FISH OIL) two times a day ZOCOR 20 MG  TABS (SIMVASTATIN) 1 by  mouth at bedtime Current Allergies: No known allergies  Current Medications (including changes made in today's visit):  VALTREX 1 GM TABS (VALACYCLOVIR HCL) 1/2 tab by mouth two times a day BENADRYL 25 MG  CAPS (DIPHENHYDRAMINE HCL) 1 by mouth at bedtime BAYER ASPIRIN 325 MG  TABS (ASPIRIN) 1 by mouth daily ATENOLOL 25 MG  TABS (ATENOLOL) 1 by mouth two times a day LISINOPRIL 10 MG  TABS (LISINOPRIL) 1 by mouth once daily FISH OIL   OIL (FISH OIL) two times a day ZOCOR 20 MG  TABS (SIMVASTATIN) 1 by mouth at bedtime

## 2010-05-03 NOTE — Assessment & Plan Note (Signed)
Summary: CPX / LFW   Vital Signs:  Patient Profile:   77 Years Old Male Height:     69.50 inches (176.53 cm) Weight:      168 pounds Temp:     97.9 degrees F oral Pulse rate:   76 / minute Pulse rhythm:   regular BP sitting:   120 / 60  (left arm) Cuff size:   regular  Vitals Entered By: Providence Crosby (December 21, 2007 8:10 AM)                 Chief Complaint:  check up hemoccult cards to patient// states recently had cataract surgery told he had a heart murmur.  History of Present Illness: Pt here for Comp Exam. Pt had Catarracts removed and docytor who examined him prior to surgery said pt had heart murmur.   Pt has had Cardiology followup in past from CAD. He had recent tick bite with some local sclerosis at the bite siyte on his right inner lower leg and some right inguinal adenopathy which is improving. No complaints today, feels well. Had some right knee discomfort this morning with getting up that is now gone.    Prior Medications Reviewed Using: Patient Recall  Current Allergies: No known allergies   Past Surgical History:    HEP A ([facility], [place]) 1954    HEMMORHOIDECTOMY/ FISSURE REPAIOR ([place]) 1954    KIDNEY STONE REMOVAL (DR [facility]) 1977    LITHOTRIPSY MULTIP 1990s    ANGIOPLASTY :(1998)    CATHERIZATION WITH ANGIOPLASTY:(1998)    CABG X4 (DR. GEARHART):(04/23/2001)    EGD; GASTRITIS :(11/29/1987)    COLONOSCOPYNORMAL:(11/29/1987)    COLONOSCOPY; HEMM. INTERNAL ,FOCAL PROCTITIS ; NEGATIVE BIOPSY:(02/07/2003)    COLONOSCOPY; INTERNAL EXTERNAL HEMORRHOIDS ;+PROCTITIS , NEGATIVE BIOPSY:(12/12/2004)    ETT/Persantine Myoview nml ( 12/10/2006)    Cataracts R 1/09  L 8/09    Risk Factors:  Caffeine use:  1 drinks per day    Counseled to quit/cut down alcohol use:  no   Review of Systems  General      Denies chills, fatigue, fever, loss of appetite, malaise, sleep disorder, sweats, weakness, and weight loss.  Eyes      Denies blurring, discharge,  double vision, eye irritation, eye pain, halos, itching, light sensitivity, red eye, vision loss-1 eye, and vision loss-both eyes.      castarracts removed, vision better.  ENT      Complains of decreased hearing.      Denies difficulty swallowing, ear discharge, earache, hoarseness, nasal congestion, nosebleeds, postnasal drainage, ringing in ears, sinus pressure, and sore throat.      mild  with bilat hearing aids  CV      Denies bluish discoloration of lips or nails, chest pain or discomfort, difficulty breathing at night, difficulty breathing while lying down, fainting, fatigue, leg cramps with exertion, lightheadness, near fainting, palpitations, shortness of breath with exertion, swelling of feet, swelling of hands, and weight gain.  Resp      Denies chest discomfort, chest pain with inspiration, cough, coughing up blood, excessive snoring, hypersomnolence, morning headaches, pleuritic, shortness of breath, sputum productive, and wheezing.  GI      Denies abdominal pain, bloody stools, change in bowel habits, constipation, dark tarry stools, diarrhea, excessive appetite, gas, hemorrhoids, indigestion, loss of appetite, nausea, vomiting, vomiting blood, and yellowish skin color.  GU      Complains of nocturia.      Denies decreased libido, discharge, dysuria, erectile dysfunction, genital sores,  hematuria, incontinence, urinary frequency, and urinary hesitancy.      one - two  MS      Denies joint pain, joint redness, joint swelling, loss of strength, low back pain, mid back pain, muscle aches, muscle , cramps, muscle weakness, stiffness, and thoracic pain.  Derm      Denies changes in color of skin, changes in nail beds, dryness, excessive perspiration, flushing, hair loss, insect bite(s), itching, lesion(s), poor wound healing, and rash.      sees derm with recent surgery.  Neuro      Denies brief paralysis, difficulty with concentration, disturbances in coordination, falling  down, headaches, inability to speak, memory loss, numbness, poor balance, seizures, sensation of room spinning, tingling, tremors, visual disturbances, and weakness.   Physical Exam  General:     Well-developed,well-nourished,in no acute distress; alert,appropriate and cooperative throughout examination, thin and balding. Head:     Normocephalic and atraumatic without obvious abnormalities.  Eyes:     Conjunctiva clear bilaterally.  Ears:     External ear exam shows no significant lesions or deformities.  Otoscopic examination reveals clear canals, tympanic membranes are intact bilaterally without bulging, retraction, inflammation or discharge. Hearing is grossly normal bilaterally. Nose:     External nasal examination shows no deformity or inflammation. Nasal mucosa are pink and moist without lesions or exudates. Mouth:     Oral mucosa and oropharynx without lesions or exudates.  Teeth in good repair. Neck:     No deformities, masses, or tenderness noted. Chest Wall:     No deformities, masses, tenderness or gynecomastia noted. Breasts:     No masses or gynecomastia noted Lungs:     Normal respiratory effort, chest expands symmetrically. Lungs are clear to auscultation, no crackles or wheezes. Heart:     Normal rate and regular rhythm. S1 and S2 normal without gallop, click, rub or other extra sounds. Mild I/VI murmur LLSB. Abdomen:     Bowel sounds positive,abdomen soft and non-tender without masses, organomegaly or hernias noted. Rectal:     No external abnormalities noted. Normal sphincter tone. No rectal masses or tenderness. G neg. Genitalia:     Testes bilaterally descended without nodularity, tenderness or masses. No scrotal masses or lesions. No penis lesions or urethral discharge. Prostate:     Prostate gland firm and smooth, no enlargement, nodularity, tenderness, mass, asymmetry or induration. 10-20 gms. Msk:     No deformity or scoliosis noted of thoracic or lumbar  spine.   Pulses:     R and L carotid,radial,femoral,dorsalis pedis and posterior tibial pulses are full and equal bilaterally Extremities:     No clubbing, cyanosis, edema, or deformity noted with normal full range of motion of all joints.   Neurologic:     No cranial nerve deficits noted. Station and gait are normal. Plantar reflexes are down-going bilaterally. DTRs are symmetrical throughout. Sensory, motor and coordinative functions appear intact. Skin:     Intact without suspicious lesions or rashes except mild echymosis L medial SUpraclav area from recent derm procedure behind left ear. Cervical Nodes:     No lymphadenopathy noted Inguinal Nodes:     No significant adenopathy Psych:     Cognition and judgment appear intact. Alert and cooperative with normal attention span and concentration. No apparent delusions, illusions, hallucinations    Impression & Recommendations:  Problem # 1:  HYPERGLYCEMIA (ICD-790.29) Assessment: Unchanged Had eaten prior to labs.  Problem # 2:  CORONARY ARTERY DISEASE (ICD-414.00) Assessment: Unchanged Will  refer to Cardiology since not seen in long time and mild heart murmur...had Myoview last year which was nirmal. His updated medication list for this problem includes:    Lisinopril 20 Mg Tabs (Lisinopril) .[name]... 1 by mouth daily    Carvedilol 6.25 Mg Tabs (Carvedilol) .[name]... 1 two times a day    Bayer Low Strength 81 Mg Tbec (Aspirin) .[name]... 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  Problem # 5:  SCREENING FOR MALIGNANT NEOPLASM, PROSTATE (ICD-V76.44) Assessment: Unchanged Stable exam and PSA.  Problem # 6:  GENITAL HERPES (ICD-054.10) Assessment:  Unchanged Stable on Valtrex regularly.  Problem # 7:  PROCTITIS (AVW-098.11) Assessment: Unchanged Stable with ANusol use as needed.  Problem # 8:  HYPERTENSION (ICD-401.9) Assessment: Unchanged  His updated medication list for this problem includes:    Lisinopril 20 Mg Tabs (Lisinopril) .[name]... 1 by mouth daily    Carvedilol 6.25 Mg Tabs (Carvedilol) .[name]... 1 two times a day  BP today: 120/60 Prior BP: 101/68 (11/04/2007)  Labs Reviewed: Creat: 1.3 (12/17/2007) Chol: 104 (12/17/2007)   HDL: 34.3 (12/17/2007)   LDL: 51 (12/17/2007)   TG: 92 (12/17/2007)   Problem # 9:  HYPERLIPIDEMIA (ICD-272.4) Assessment: Unchanged  His updated medication list for this problem includes:    Simvastatin 20 Mg Tabs (Simvastatin) ..... One tab by mouth at night  Labs Reviewed: Chol: 104 (12/17/2007)   HDL: 34.3 (12/17/2007)   LDL: 51 (12/17/2007)   TG: 92 (12/17/2007) SGOT: 28 (12/17/2007)   SGPT: 23 (12/17/2007)   Problem # 10:  GERD (ICD-530.81) Assessment: Unchanged Stable.  Complete Medication List: 1)  Valtrex 1 Gm Tabs (Valacyclovir hcl) .... 1/2 tab by mouth two times a day 2)  Benadryl 25 Mg Caps (Diphenhydramine hcl) .[name].. 1 by mouth at bedtime 3)  Lisinopril 20 Mg Tabs (Lisinopril) .[name].. 1 by mouth daily 4)  Fish Oil 1000 Mg Caps (Omega-3 fatty acids) .... Take 2 - 3 every day 5)  Simvastatin 20 Mg Tabs (Simvastatin) .... One tab by mouth at night 6)  Carvedilol 6.25 Mg Tabs (Carvedilol) .[name].. 1 two times a day 7)  Bayer Low Strength 81 Mg Tbec (Aspirin) .[name].. 1 daily by mouth 8)  Anusol-hc 25 Mg Supp (Hydrocortisone acetate) .... One pr three times a day as needed  Other Orders: Flu Vaccine 62yrs + (91478) Admin 1st Vaccine (29562)   Patient Instructions: 1)  Refer to Cardiology. 2)  RTC 6 mos.   Prescriptions: ANUSOL-HC 25 MG SUPP (HYDROCORTISONE ACETATE) one pr three times a day as needed  #30 x 12   Entered and Authorized by:   Shaune Leeks MD   Signed by:    Shaune Leeks MD on 12/21/2007   Method used:   Electronically to        [facility] [phone]* (retail)       [address] [place], [place]  [address]       Ph: [phone] or [phone]       Fax: [phone]   RxID:   [id number]  ]  Influenza Vaccine    Vaccine Type: Fluvax 3+  Site: left deltoid    Mfr: GlaxoSmithKline    Dose: 0.5 ml    Route: IM    Given by: Providence Crosby    Exp. Date: 06/28/2008    Lot #: [id number]    VIS given: 10/23/06 version given December 21, 2007.

## 2010-05-03 NOTE — Letter (Signed)
Summary: [facility]   Imported By: Lester Ford 01/24/2009 12:36:59  _____________________________________________________________________  External Attachment:    Type:   Image     Comment:   External Document

## 2010-05-03 NOTE — Progress Notes (Signed)
Summary: needs refill on valtrex  Phone Note Refill Request Call back at [phone] Message from:  Patient  Refills Requested: Medication #1:  VALTREX 1 GM TABS 1/2 tab by mouth two times a day Pt needs 90 day script for mail order, please call when ready.  Initial call taken by: Lowella Petties,  July 14, 2008 9:35 AM  Follow-up for Phone Call        CALLED PATIENT TO PICK UP Follow-up by: Providence Crosby,  July 14, 2008 12:19 PM      Prescriptions: VALTREX 1 GM TABS (VALACYCLOVIR HCL) 1/2 tab by mouth two times a day  #90 x 3   Entered by:   Providence Crosby   Authorized by:   Shaune Leeks MD   Signed by:   Providence Crosby on 07/14/2008   Method used:   Print then Give to Patient   RxID:   [id number]  Shaune Leeks MD  July 14, 2008 12:14 PM

## 2010-05-03 NOTE — Miscellaneous (Signed)
  Clinical Lists Changes  Problems: Changed problem from LEUKOCYTOSIS (ICD-288.60) to LEUKOCYTOSIS CLL (DR [facility]) (ICD-288.60)

## 2010-05-03 NOTE — Progress Notes (Signed)
Summary: refill request for levitra  Phone Note Refill Request Message from:  Fax from Pharmacy  Refills Requested: Medication #1:  levitra 20 mg   Last Refilled: 04/02/2009 [facility] [address], this is no longer on med list.  Initial call taken by: Lowella Petties CMA,  July 17, 2009 9:28 AM    New/Updated Medications: LEVITRA 20 MG TABS (VARDENAFIL HCL) one tab by mouth one hour prior Prescriptions: LEVITRA 20 MG TABS (VARDENAFIL HCL) one tab by mouth one hour prior  #10 x 5   Entered and Authorized by:   Shaune Leeks MD   Signed by:   Shaune Leeks MD on 07/17/2009   Method used:   [facility] to        [facility] [phone]* (retail)       [address] [place], [place]  [address]       Ph: [phone] or [phone]       Fax: [phone]   RxID:   [id number]

## 2010-05-03 NOTE — Assessment & Plan Note (Signed)
Summary: needs stitches removed from eye/bir   Vital Signs:  Patient Profile:   77 Years Old Male Height:     69.50 inches (176.53 cm) Weight:      175 pounds Temp:     97.5 degrees F oral Pulse rate:   68 / minute Pulse rhythm:   regular BP sitting:   120 / 70  (left arm) Cuff size:   regular  Vitals Entered ByMarland Kitchen Providence Crosby (May 28, 2007 2:39 PM)                 Chief Complaint:  REMOVE SUTURES FROM RIGHT EYE.  History of Present Illness: Here for suture removal, fell getting pout of bed the other day, got quite a gash over the lateral margin of his right eye...now with significant shiner.    Prior Medications Reviewed Using: Patient Recall  Current Allergies: No known allergies       Physical Exam  General:     Well-developed,well-nourished,in no acute distress; alert,appropriate and cooperative throughout examination Head:     Normocephalic and atraumatic without obvious abnormalities. No apparent alopecia or balding. Eyes:     Conjunctiva clear bilaterally. ^-0 nylon running suture over lat margin right eye, superior surface. Ears:     External ear exam shows no significant lesions or deformities.  Otoscopic examination reveals clear canals, tympanic membranes are intact bilaterally without bulging, retraction, inflammation or discharge. Hearing is grossly normal bilaterally.    Impression & Recommendations:  Problem # 1:  ENCOUNTER FOR REMOVAL OF SUTURES (ICD-V58.32) Assessment: New Removed...keep clean and dry for 24 hours.  Complete Medication List: 1)  Valtrex 1 Gm Tabs (Valacyclovir hcl) .... 1/2 tab by mouth two times a day 2)  Benadryl 25 Mg Caps (Diphenhydramine hcl) .[name].. 1 by mouth at bedtime 3)  Bayer Aspirin 81 Mg Tabs (aspirin)  .[name].. 1 by mouth daily 4)  Lisinopril 20 Mg Tabs (Lisinopril) .[name].. 1 by mouth daily 5)  Fish Oil Oil (Fish oil) .... Two times a day 6)  Zocor 20 Mg Tabs (Simvastatin) .[name].. 1 by mouth at bedtime 7)   Carvedilol 6.25 Mg Tabs (Carvedilol) .[name].. 1 bid 8)  Anusol-hc 25 Mg Supp (Hydrocortisone acetate) .... One per rectum three times a day for one week then as needed   Patient Instructions: 1)  RTC as needed.    ]

## 2010-05-03 NOTE — Assessment & Plan Note (Signed)
Summary: 1 MONTH FOLLOW UP BP/RBH   Vital Signs:  Patient Profile:   77 Years Old Male Height:     69.50 inches (176.53 cm) Weight:      173 pounds Temp:     98.6 degrees F oral Pulse rate:   64 / minute Pulse rhythm:   regular BP sitting:   104 / 60  (left arm) Cuff size:   regular  Vitals Entered By: Providence Crosby (February 05, 2007 10:01 AM)                 Chief Complaint:  1 MONTH F/U.  History of Present Illness: Has head congestion for last two weeks, taking Nyqiul and dayquil. Tolerating the half of Coreg ok...told to gradually increase the dose to whole two times a day. Has appt next month for chol labs and return to discuss. Other than congestion, feels well.  Current Allergies: No known allergies       Physical Exam  General:     Well-developed,well-nourished,in no acute distress; alert,appropriate and cooperative throughout examination. mildly congested. Head:     Normocephalic and atraumatic without obvious abnormalities. No apparent alopecia or balding. Eyes:     Conjunctiva clear bilaterally.  Ears:     External ear exam shows no significant lesions or deformities.  Otoscopic examination reveals clear canals, tympanic membranes are intact bilaterally without bulging, retraction, inflammation or discharge. Hearing is grossly normal bilaterally. Nose:     External nasal examination shows no deformity or inflammation. Nasal mucosa are pink and moist without lesions or exudates. Mouth:     Oral mucosa and oropharynx without lesions or exudates.  Teeth in good repair. Neck:     No deformities, masses, or tenderness noted. Chest Wall:     No deformities, masses, tenderness or gynecomastia noted. Lungs:     Normal respiratory effort, chest expands symmetrically. Lungs are clear to auscultation, no crackles or wheezes. Heart:     Normal rate and regular rhythm. S1 and S2 normal without gallop, murmur, click, rub or other extra sounds.    Impression  & Recommendations:  Problem # 1:  URI (ICD-465.9) Assessment: New Stop dayquil and nyquil...start Guaifenesin per instructions, fluids and tyl. His updated medication list for this problem includes:    Benadryl 25 Mg Caps (Diphenhydramine hcl) .[name]... 1 by mouth at bedtime Instructed on symptomatic treatment. Call if symptoms persist or worsen.   Problem # 2:  HYPERTENSION (ICD-401.9) Assessment: Improved Amazing in the face of taking dayquil and nyquil! His updated medication list for this problem includes:    Lisinopril 20 Mg Tabs (Lisinopril) .[name]... 1 by mouth daily    Carvedilol 6.25 Mg Tabs (Carvedilol) .[name]... 1 bid  BP today: 104/60 Prior BP: 120/60 (12/16/2006)  Labs Reviewed: Creat: 1.4 (12/12/2006) Chol: 84 (12/12/2006)   HDL: 22.5 (12/12/2006)   LDL: 46 (12/12/2006)   TG: 80 (12/12/2006)   Problem # 3:  CORONARY ARTERY DISEASE (ICD-414.00) Assessment: Unchanged Check CBC next month Labs Reviewed: Chol: 84 (12/12/2006)   HDL: 22.5 (12/12/2006)   LDL: 46 (12/12/2006)   TG: 80 (12/12/2006)   Complete Medication List: 1)  Valtrex 1 Gm Tabs (Valacyclovir hcl) .... 1/2 tab by mouth two times a day 2)  Benadryl 25 Mg Caps (Diphenhydramine hcl) .[name].. 1 by mouth at bedtime 3)  Bayer Aspirin 81 Mg Tabs (aspirin)  .[name].. 1 by mouth daily 4)  Lisinopril 20 Mg Tabs (Lisinopril) .[name].. 1 by mouth daily 5)  Fish Oil Oil (  Fish oil) .... Two times a day 6)  Zocor 20 Mg Tabs (Simvastatin) .[name].. 1 by mouth at bedtime 7)  Carvedilol 6.25 Mg Tabs (Carvedilol) .[name].. 1 bid 8)  Anusol-hc 25 Mg Supp (Hydrocortisone acetate) .... One per rectum three times a day for one week then as needed   Patient Instructions: 1)  Take: 2)  GUAIFENESIN  600mg  by mouth AM and NOON   3)    ROBITUSSIN PLAIN (NO LETTERS, NO NAMES) two tablespoons  or 4)    [facility] MUCOUS RELIEF EXPECTORANT (400 mg) 11/2 TABS  or 5)    GUAIFENESIN (200 MG) 3 TABS                    6)  RTC as scheduled.Labs prior with FBS and  CBC.    ]

## 2010-05-03 NOTE — Assessment & Plan Note (Signed)
Summary: cpx/rbh   Vital Signs:  Patient Profile:   77 Years Old Male Height:     69.50 inches (176.53 cm) Weight:      172 pounds Temp:     98.6 degrees F oral Pulse rate:   64 / minute Pulse rhythm:   regular BP sitting:   120 / 60  (left arm) Cuff size:   regular  Vitals Entered ByProvidence Crosby (December 16, 2006 2:02 PM)                 Chief Complaint:  check up// hemocult cards to patient.  History of Present Illness: Doing ok and feeling fine. Has another heinous proble with internal and external hemms. Requests Anusol hc. Did stress test last Thurs., did labs Friday and sat stirred apple butter all day and Sunday developed hemms.  Current Allergies: No known allergies   Past Surgical History:    HEP A (FT [place]ACK[place], [place]) 1954    HEMMORHOIDECTOMY/ FISSURE REPAIOR ([place]) 1954    KIDNEY STONE REMOVAL (DR BATES) 1977    LITHOTRIPSY MULTIP 1990s    ANGIOPLASTY :(1998)    CATHERIZATION WITH ANGIOPLASTY:(1998)    CABG X4 (DR. GEARHART):(04/23/2001)    EGD; GASTRITIS :(11/29/1987)    COLONOSCOPYNORMAL:(11/29/1987)    COLONOSCOPY; HEMM. INTERNAL ,FOCAL PROCTITIS ; NEGATIVE BIOPSY:(02/07/2003)    COLONOSCOPY; INTERNAL EXTERNAL HEMORRHOIDS ;+PROCTITIS , NEGATIVE BIOPSY:(12/12/2004)    ETT/Persantine Myoview nml ( 12/10/2006)    Risk Factors:  Passive smoke exposure:  no Drug use:  no HIV high-risk behavior:  no Caffeine use:  0 drinks per day Alcohol use:  yes    Type:  beer yearly, rare wine    Drinks per day:  0    Has patient --       Felt need to cut down:  no       Been annoyed by complaints:  no       Felt guilty about drinking:  no       Needed eye opener in the morning:  no    Counseled to quit/cut down alcohol use:  no Exercise:  yes    Times per week:  5    Type:  walks 2 miles Seatbelt use:  100 %   Review of Systems  General      Denies chills, fatigue, fever, loss of appetite, malaise, sleep disorder, sweats, weakness, and weight loss.  Eyes      catarract surgery   ENT      Complains of decreased hearing.      Denies difficulty swallowing, ear discharge, earache, hoarseness, nasal congestion, nosebleeds, postnasal drainage, ringing in ears, sinus pressure, and sore throat.      Hearing aids bilat  CV      Denies bluish discoloration of lips or nails, chest pain or discomfort, difficulty breathing at night, difficulty breathing while lying down, fainting, fatigue, leg cramps with exertion, lightheadness, near fainting, palpitations, shortness of breath with exertion, swelling of feet, swelling of hands, and weight gain.  Resp      Complains of cough.      Denies chest discomfort, chest pain with inspiration, coughing up blood, excessive snoring, hypersomnolence, morning headaches, pleuritic, shortness of breath, sputum productive, and wheezing.  GI      Complains of bloody stools and hemorrhoids.      Denies abdominal pain, change in bowel habits, constipation, dark tarry stools, diarrhea, excessive appetite, gas, indigestion, loss of appetite, nausea, vomiting, vomiting blood, and yellowish skin  color.  GU      Complains of nocturia.      Denies decreased libido, discharge, dysuria, erectile dysfunction, genital sores, hematuria, incontinence, urinary frequency, and urinary hesitancy.      once  MS      Complains of low back pain.      Denies joint pain, joint redness, joint swelling, loss of strength, mid back pain, muscle aches, muscle , cramps, muscle weakness, stiffness, and thoracic pain.      occas  Derm      Complains of rash.      Denies changes in color of skin, changes in nail beds, dryness, excessive perspiration, flushing, hair loss, insect bite(s), itching, lesion(s), and poor wound healing.   Physical Exam  General:     Well-developed,well-nourished,in no acute distress; alert,appropriate and cooperative throughout examination Head:     Normocephalic and atraumatic without obvious  abnormalities. No apparent alopecia or balding. Eyes:     Conjunctiva clear bilaterally.  Ears:     External ear exam shows no significant lesions or deformities.  Otoscopic examination reveals clear canals, tympanic membranes are intact bilaterally without bulging, retraction, inflammation or discharge. Hearing is grossly normal bilaterally. Nose:     External nasal examination shows no deformity or inflammation. Nasal mucosa are pink and moist without lesions or exudates. Mouth:     Oral mucosa and oropharynx without lesions or exudates.  Teeth in good repair. Neck:     No deformities, masses, or tenderness noted. Chest Wall:     No deformities, masses, tenderness or gynecomastia noted. Breasts:     No masses or gynecomastia noted Lungs:     Normal respiratory effort, chest expands symmetrically. Lungs are clear to auscultation, no crackles or wheezes. Heart:     Normal rate and regular rhythm. S1 and S2 normal without gallop, murmur, click, rub or other extra sounds. Abdomen:     Bowel sounds positive,abdomen soft and non-tender without masses, organomegaly or hernias noted. Rectal:     No external abnormalities noted. Normal sphincter tone. No rectal masses or tenderness. Sclerotic areas x2 in canl, G neg currently. Genitalia:     Testes bilaterally descended without nodularity, tenderness or masses. No scrotal masses or lesions. No penis lesions or urethral discharge. Prostate:     Prostate gland firm and smooth, no enlargement, nodularity, tenderness, mass, asymmetry or induration. 10gms. Msk:     No deformity or scoliosis noted of thoracic or lumbar spine.   Pulses:     R and L carotid,radial,femoral,dorsalis pedis and posterior tibial pulses are full and equal bilaterally Extremities:     No clubbing, cyanosis, edema, or deformity noted with normal full range of motion of all joints.   Neurologic:     No cranial nerve deficits noted. Station and gait are normal. Plantar  reflexes are down-going bilaterally. DTRs are symmetrical throughout. Sensory, motor and coordinative functions appear intact. Skin:     Intact without suspicious lesions or rashes Cervical Nodes:     No lymphadenopathy noted Inguinal Nodes:     No significant adenopathy Psych:     Cognition and judgment appear intact. Alert and cooperative with normal attention span and concentration. No apparent delusions, illusions, hallucinations    Impression & Recommendations:  Problem # 1:  DERMATITIS, SEBORRHEIC NOS (ICD-690.10) Assessment: Unchanged Refer to Dr Danella Deis for exam, h/o skin Ca. Orders: Dermatology Referral (Derma)   Problem # 2:  SCREENING FOR MALIGNANT NEOPLASM, PROSTATE (ICD-V76.44) Assessment: Unchanged Stable  Problem #  3:  GENITAL HERPES (ICD-054.10) Assessment: Unchanged Cont Valtrex.  Problem # 4:  HYPERGLYCEMIA (ICD-790.29) Assessment: Improved Normal today, avoid sweets and carbs as much as possible.  Problem # 5:  PROCTITIS (ZOX-096.04) Assessment: Unchanged Currently inflamed w/ active hemms.  Problem # 6:  HYPERTENSION (ICD-401.9) Assessment: Unchanged  His updated medication list for this problem includes:    Lisinopril 10 Mg Tabs (Lisinopril) .[name]... 1 by mouth once daily    Carvedilol 6.25 Mg Tabs (Carvedilol) .[name]... 1 bid  BP today: 120/60 Prior BP: 120/72 (10/09/2006)  Labs Reviewed: Creat: 1.4 (12/12/2006) Chol: 84 (12/12/2006)   HDL: 22.5 (12/12/2006)   LDL: 46 (12/12/2006)   TG: 80 (12/12/2006)   Problem # 7:  HYPERLIPIDEMIA (ICD-272.4) Assessment: Improved Stable...cont Zocor. His updated medication list for this problem includes:    Zocor 20 Mg Tabs (Simvastatin) .[name]... 1 by mouth at bedtime   Problem # 8:  GERD (ICD-530.81) Assessment: Unchanged Stable  Problem # 9:  CORONARY ARTERY DISEASE (ICD-414.00) Assessment: Improved Now on Coreg. His updated medication list for this problem includes:    Lisinopril 10 Mg Tabs  (Lisinopril) .[name]... 1 by mouth once daily    Carvedilol 6.25 Mg Tabs (Carvedilol) .[name]... 1 bid  Labs Reviewed: Chol: 84 (12/12/2006)   HDL: 22.5 (12/12/2006)   LDL: 46 (12/12/2006)   TG: 80 (12/12/2006)   Complete Medication List: 1)  Valtrex 1 Gm Tabs (Valacyclovir hcl) .... 1/2 tab by mouth two times a day 2)  Benadryl 25 Mg Caps (Diphenhydramine hcl) .[name].. 1 by mouth at bedtime 3)  Bayer Aspirin 81 Mg Tabs (aspirin)  .[name].. 1 by mouth daily 4)  Lisinopril 10 Mg Tabs (Lisinopril) .[name].. 1 by mouth once daily 5)  Fish Oil Oil (Fish oil) .... Two times a day 6)  Zocor 20 Mg Tabs (Simvastatin) .[name].. 1 by mouth at bedtime 7)  Carvedilol 6.25 Mg Tabs (Carvedilol) .[name].. 1 bid 8)  Anusol-hc 25 Mg Supp (Hydrocortisone acetate) .... One per rectum three times a day for one week then as needed   Patient Instructions: 1)  RTC 3 mos, LAb prior CBC    Prescriptions: ANUSOL-HC 25 MG  SUPP (HYDROCORTISONE ACETATE) one per rectum three times a day for one week then as needed  #30 x 12   Entered and Authorized by:   Shaune Leeks MD   Signed by:   Shaune Leeks MD on 12/16/2006   Method used:   Print then Give to Patient   RxID:   [id number]  ]

## 2010-05-03 NOTE — Letter (Signed)
Summary: Results Follow up Letter  [place] [facility]  [address] [place], [place] [address]   Phone: [phone]  Fax: [phone]    01/29/2007 MRN: [id number]  Jimmy [name] [address] [place], [place]  [address]  Dear Mr. BARG,  The following are the results of your recent test(s):  Test         Result    Pap Smear:        Normal _____  Not Normal _____ Comments: ______________________________________________________ Cholesterol: LDL(Bad cholesterol):         Your goal is less than:         HDL (Good cholesterol):       Your goal is more than: Comments:  ______________________________________________________ Mammogram:        Normal _____  Not Normal _____ Comments:  ___________________________________________________________________ Hemoccult:        Normal __x___  Not normal _______ Comments:  NO BLOOD IN STOOL. THANK YOU FOR RETURNING THE HEMOCULT CARDS. PLEASE MAKE SURE TO REPEAT IN ONE YEAR  _____________________________________________________________________ Other Tests:    We routinely do not discuss normal results over the telephone.  If you desire a copy of the results, or you have any questions about this information we can discuss them at your next office visit.   Sincerely,

## 2010-05-03 NOTE — Assessment & Plan Note (Signed)
Summary: 6 MONTH FOLLOW UP/RBH   Vital Signs:  Patient profile:   77 year old male Weight:      170 pounds BMI:     24.83 Temp:     98.3 degrees F oral Pulse rate:   72 / minute Pulse rhythm:   regular BP sitting:   130 / 70  (left arm) Cuff size:   regular  Vitals Entered By: Lowella Petties (June 14, 2008 8:09 AM)  History of Present Illness: Pt here for followup. Had rectal bleeding last time and it has now resolved. He is using suppositioriea only now, determined by density of his stool. He uses Metamucil daily and he takes stool softeners twice a day. He had been given Diltiazem Ointment which significantly helped. He is to have some tooth work soon. Surgery by Dr Donell Beers on 3/29 with f/u 4/6. He will have one tooth extraction with bone grafting. 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 His lymph node is significant as he has CLL and he is due to see Dr Welton Flakes within the month.  Allergies: No Known Drug Allergies  Past History:  Past Medical History:    Reviewed history from 12/04/2006 and no changes required:    Coronary artery disease :(S/P CABG X 4 04/2001)    ZHYQ:(6578'I)    Hyperlipidemia:(2002)    Hypertension:(2002)  Past Surgical History:    Reviewed history from 12/21/2007 and no changes required:    HEP A (FT [place], [place]) 1954    HEMMORHOIDECTOMY/ FISSURE REPAIOR ([place]) 1954    KIDNEY STONE REMOVAL (DR BATES) 1977    LITHOTRIPSY MULTIP 1990s    ANGIOPLASTY :(1998)    CATHERIZATION WITH ANGIOPLASTY:(1998)    CABG X4 (DR. GEARHART):(04/23/2001)    EGD; GASTRITIS :(11/29/1987)    COLONOSCOPYNORMAL:(11/29/1987)    COLONOSCOPY; HEMM. INTERNAL ,FOCAL PROCTITIS ; NEGATIVE BIOPSY:(02/07/2003)    COLONOSCOPY; INTERNAL EXTERNAL HEMORRHOIDS ;+PROCTITIS , NEGATIVE BIOPSY:(12/12/2004)    ETT/Persantine Myoview nml ( 12/10/2006)    Cataracts R 1/09  L  8/09  Physical Exam  General:  alert, well-developed, well-nourished, and well-hydrated.   Head:  Normocephalic and atraumatic without obvious abnormalities.  Eyes:  Conjunctiva clear bilaterally.  Ears:  R ear normal and L ear normal.   Nose:  no mucosal edema, no airflow obstruction, and mucosal erythema.  sinuses neg Mouth:  no exudates and pharyngeal erythema.   Neck:  No deformities, masses, or tenderness noted. Chest Wall:  No deformities, masses, tenderness or gynecomastia noted. Lungs:  normal respiratory effort, no intercostal retractions, no accessory muscle use, normal breath sounds, no crackles, and no wheezes.   Heart:  Normal rate and regular rhythm. S1 and S2 normal without gallop, click, rub or other extra sounds. Mild I/VI murmur LLSB.   Impression & Recommendations:  Problem # 1:  URI (ICD-465.9) Assessment Improved Resolved. His updated medication list for this problem includes:    Benadryl 25 Mg Caps (Diphenhydramine hcl) .[name]... 1 by mouth at bedtime    Bayer Low Strength 81 Mg Tbec (Aspirin) .[name]... 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 To be rechecked by Dr Welton Flakes.  Problem # 3:  PROCTITIS (ONG-295.28) Assessment: Improved Start Glycerin Supps instead of Anusol to avoid chronic cortisone exposure to the mucous membr.  Problem # 4:  CORONARY ARTERY DISEASE (ICD-414.00) Assessment: Unchanged  Seen by Cardiology since seen here...stable.  His updated medication list for this problem includes:    Lisinopril 20 Mg Tabs (Lisinopril) .[name]... 1 by mouth daily    Carvedilol 6.25 Mg Tabs (Carvedilol) .[name]... 1 two times a day    Bayer Low Strength 81 Mg Tbec (Aspirin) .[name]... 1 daily by mouth  Labs Reviewed: Chol: 104 (12/17/2007)   HDL: 34.3 (12/17/2007)   LDL: 51 (12/17/2007)   TG: 92 (12/17/2007)  Problem # 5:  HYPERTENSION (ICD-401.9) Assessment: Unchanged Stable. His updated  medication list for this problem includes:    Lisinopril 20 Mg Tabs (Lisinopril) .[name]... 1 by mouth daily    Carvedilol 6.25 Mg Tabs (Carvedilol) .[name]... 1 two times a day  BP today: 130/70 Prior BP: 100/70 (05/05/2008)  Labs Reviewed: Creat: 1.3 (12/17/2007) Chol: 104 (12/17/2007)   HDL: 34.3 (12/17/2007)   LDL: 51 (12/17/2007)   TG: 92 (12/17/2007)  Complete Medication List: 1)  Valtrex 1 Gm Tabs (Valacyclovir hcl) .... 1/2 tab by mouth two times a day 2)  Benadryl 25 Mg Caps (Diphenhydramine hcl) .[name].. 1 by mouth at bedtime 3)  Lisinopril 20 Mg Tabs (Lisinopril) .[name].. 1 by mouth daily 4)  Fish Oil 1000 Mg Caps (Omega-3 fatty acids) .... Take 2 - 3 every day 5)  Simvastatin 20 Mg Tabs (Simvastatin) .... One tab by mouth at night 6)  Carvedilol 6.25 Mg Tabs (Carvedilol) .[name].. 1 two times a day 7)  Bayer Low Strength 81 Mg Tbec (Aspirin) .[name].. 1 daily by mouth 8)  Anusol-hc 25 Mg Supp (Hydrocortisone acetate) .... One pr three times a day as needed 9)  Diltiazem Hcl Powd (Diltiazem hcl) .... 2% apply cream twice a day 10)  Levitra 20 Mg Tabs (Vardenafil hcl) .... One tab by mouth one hour prior to desired intercourse. 11)  [facility] Suppository (adult) 3 Gm Supp (Glycerin (laxative)) .... One supp per rectum as needed for hard stools.  Patient Instructions: 1)  RTC for Comp Exam 6 mos, labs prior 2)  The medication list was reviewed and reconciled.  All changed / newly prescribed medications were explained.  A complete medication list was provided to the patient / caregiver. Prescriptions: [facility] SUPPOSITORY (ADULT) 3 GM SUPP (GLYCERIN (LAXATIVE)) one supp per rectum as needed for hard stools.  #50 x 12   Entered and Authorized by:   Shaune Leeks MD   Signed by:   Shaune Leeks MD on 06/14/2008   Method used:   Electronically to        [facility] [phone]* (retail)       [address] [place], [place]  [address]       Ph: [phone] or [phone]        Fax: [phone]   RxID:   [id number] LEVITRA 20 MG TABS (VARDENAFIL HCL) one tab by mouth one hour prior to desired intercourse.  #10 x 12   Entered and Authorized by:   Shaune Leeks MD   Signed by:   Shaune Leeks MD on 06/14/2008   Method used:   Electronically to        [facility] [phone]* (retail)       [address]       [place], [place]  [address]  Ph: [phone] or [phone]       Fax: [phone]   RxID:   [id number]       Prior Medications (reviewed today): VALTREX 1 GM TABS (VALACYCLOVIR HCL) 1/2 tab by mouth two times a day BENADRYL 25 MG  CAPS (DIPHENHYDRAMINE HCL) 1 by mouth at bedtime LISINOPRIL 20 MG  TABS (LISINOPRIL) 1 by mouth daily FISH OIL 1000 MG  CAPS (OMEGA-3 FATTY ACIDS) TAKE 2 - 3 EVERY DAY SIMVASTATIN 20 MG TABS (SIMVASTATIN) one tab by mouth at night CARVEDILOL 6.25 MG  TABS (CARVEDILOL) 1 two times a day BAYER LOW STRENGTH 81 MG TBEC (ASPIRIN) 1 daily by mouth ANUSOL-HC 25 MG SUPP (HYDROCORTISONE ACETATE) one pr three times a day as needed DILTIAZEM HCL  POWD (DILTIAZEM HCL) 2% APPLY CREAM TWICE A DAY Current Allergies: No known allergies

## 2010-05-03 NOTE — Letter (Signed)
Summary: [place] [facility], [facility] at [facility] [address]   [place], [place] [address]   Phone: [phone]  Fax: [phone]      January 16, 2010 MRN: [id number]   Jimmy [name] [address] [place] [place], [place]  [address]   Dear Jimmy [name],  Your test ordered by Selena Batten has been reviewed by your physician (or physician assistant) and was found to be normal or stable. Your physician (or physician assistant) felt no changes were needed at this time.  ____ Echocardiogram  ____ Cardiac Stress Test  ____ Lab Work  __X__ Peripheral vascular study of arms, legs or neck  ____ CT scan or X-ray  ____ Lung or Breathing test  ____ Other:   Thank you.   Benedict Needy, RN    Arvilla Meres, MD, F.A.C.C

## 2010-05-03 NOTE — Letter (Signed)
Summary: Jimmy [name] letter  [facility]  [address] [place], [place] [address]   Phone: [phone]  Fax: [phone]       11/02/2009 MRN: [id number]  KAYLIB FURNESS [address]  [address] [place], [place]  [address]  Dear Mr. PULLMAN,  [facility] [place], and [facility] announce the retirement of Arta Silence, M.D., from full-time practice at the [facility] office effective September 28, 2009 and his plans of returning part-time.  It is important to Dr. Hetty Ely and to our practice that you understand that [facility] has seven physicians in our office for your health care needs.  We will continue to offer the same exceptional care that you have today.    Dr. Hetty Ely has spoken to many of you about his plans for retirement and returning part-time in the fall.   We will continue to work with you through the transition to schedule appointments for you in the office and meet the high standards that [place] is committed to.   Again, it is with great pleasure that we share the news that Dr. Hetty Ely will return to [facility] in October of 2011 with a reduced schedule.    If you have any questions, or would like to request an appointment with one of our physicians, please call us at [phone] and press the option for Scheduling an appointment.  We take pleasure in providing you with excellent patient care and look forward to seeing you at your next office visit.  Our [facility] Physicians are:  Tillman Abide, M.D. Laurita Quint, M.D. Roxy Manns, M.D. Kerby Nora, M.D. Hannah Beat, M.D. Ruthe Mannan, M.D. We proudly welcomed Raechel Ache, M.D. and Eustaquio Boyden, M.D. to the practice in July/August 2011.  Sincerely,  [facility]

## 2010-05-03 NOTE — Assessment & Plan Note (Signed)
Summary: FLU SHOT/CLE  Nurse Visit    Prior Medications: VALTREX 1 GM TABS (VALACYCLOVIR HCL) 1/2 tab by mouth two times a day BENADRYL 25 MG  CAPS (DIPHENHYDRAMINE HCL) 1 by mouth at bedtime BAYER ASPIRIN 81 MG  TABS (ASPIRIN) () 1 by mouth daily LISINOPRIL 20 MG  TABS (LISINOPRIL) 1 by mouth daily FISH OIL   OIL (FISH OIL) two times a day ZOCOR 20 MG  TABS (SIMVASTATIN) 1 by mouth at bedtime CARVEDILOL 6.25 MG  TABS (CARVEDILOL) 1 bid ANUSOL-HC 25 MG  SUPP (HYDROCORTISONE ACETATE) one per rectum three times a day for one week then as needed Current Allergies: No known allergies    Influenza Vaccine    Vaccine Type: Fluvax 3+    Site: left deltoid    Mfr: Sanofi Pasteur    Dose: 0.5 ml    Route: IM    Given by: Cooper Render    Exp. Date: 09/29/2007    Lot #: [id number]    VIS given: 09/28/04 version given January 16, 2007.  Flu Vaccine Consent Questions    Do you have a history of severe allergic reactions to this vaccine? no    Any prior history of allergic reactions to egg and/or gelatin? no    Do you have a sensitivity to the preservative Thimersol? no    Do you have a past history of Guillan-Barre Syndrome? no    Do you currently have an acute febrile illness? no    Have you ever had a severe reaction to latex? no    Vaccine information given and explained to patient? yes   Orders Added: 1)  Flu Vaccine 71yrs + [90658] 2)  Admin 1st Vaccine [email or web address]    ]

## 2010-05-03 NOTE — Miscellaneous (Signed)
Summary: PT Plan of Care/[facility]  PT Plan of Care/[facility]   Imported By: Lanelle Bal 11/29/2008 09:51:29  _____________________________________________________________________  External Attachment:    Type:   Image     Comment:   External Document

## 2010-05-03 NOTE — Assessment & Plan Note (Signed)
Summary: REMOVE PLACE ON HEAD/CLE   Vital Signs:  Patient profile:   77 year old male Weight:      166 pounds Temp:     98.0 degrees F oral Pulse rate:   60 / minute Pulse rhythm:   regular BP sitting:   120 / 68  (left arm) Cuff size:   regular  Vitals Entered By: Sydell Axon LPN (January 03, 2009 12:06 PM) CC: Lesion removal-scalp   History of Present Illness: Pt here for chronic lesion on his scalp to have removed. It is constantly being clipped at the barbers or hit with a comb. He wants it removed. It flares and drains at times. Gets sore but no fever or chills and no overt rednees spreading.  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 1/2 Tab By Mouth Two Times A Day 2)  Benadryl 25 Mg  Caps (Diphenhydramine Hcl) .[name].. 1 By Mouth At Bedtime 3)  Lisinopril 20 Mg  Tabs (Lisinopril) .[name].. 1 By Mouth Daily 4)  Fish Oil 1000 Mg  Caps (Omega-3 Fatty Acids) .... Take 2 - 3 Every Day 5)  Simvastatin 20 Mg Tabs (Simvastatin) .... One Tab By Mouth At Night 6)  Carvedilol 6.25 Mg  Tabs (Carvedilol) .[name].. 1 Two Times A Day 7)  Bayer Low Strength 81 Mg Tbec (Aspirin) .[name].. 1 Daily By Mouth 8)  Cvs Suppository (Adult) 3 Gm Supp (Glycerin (Laxative)) .... One Supp Per Rectum As Needed For Hard Stools.   Allergies: No Known Drug Allergies  Physical Exam  General:  GEN: Well-developed,well-nourished,in no acute distress; alert,appropriate and cooperative throughout examination HEENT: Normocephalic and atraumatic without obvious abnormalities. No apparent alopecia or balding. Ears, externally no deformities PULM: Breathing comfortably in no respiratory distress EXT: No clubbing, cyanosis, or edema PSYCH: Normally interactive. Cooperative during the interview. Pleasant. Friendly and conversant. Not anxious or depressed appearing. Normal, full affect.  Head:  Normocephalic and atraumatic without obvious abnormalities.  Eyes:  Conjunctiva clear bilaterally.  Ears:  R ear normal and L ear normal.   Nose:  no mucosal edema, no airflow obstruction, and mucosal erythema.  sinuses neg Mouth:  no exudates and pharyngeal erythema.   Neck:  No deformities, masses, or tenderness noted. Lungs:  Normal respiratory effort, chest expands symmetrically. Lungs are clear to auscultation, no crackles or wheezes. Heart:  Normal rate and regular rhythm. S1 and S2 normal without gallop, murmur, click, rub or other extra sounds. Skin:  Lwft temporal scalp, 6mm lesion with 1cm base with pus emanating, fairly firm. Area prepped sterilely with betadine. Lesion incised and totally drained of sebum. Lesion then further excised and cauterized to the base, dessicated and recauterized multiple times. Pt tolerated well. Neosporin and staerile dressing placed.   Impression & Recommendations:  Problem # 1:  LESION, SCALP (ICD-238.2)  Assessment Improved  Sebaceous cyst, recurrent with continued irritation and sclerosis. Keep clean and dry for 24 hrs. RTC if causes problems.  Orders: Excise lesion ([facility]) 0 - 0.5 cm (11420)  Complete Medication List: 1)  Valtrex 1 Gm Tabs (Valacyclovir hcl) .... 1/2 tab by mouth two times a day 2)  Benadryl 25 Mg Caps (Diphenhydramine hcl) .[name].. 1 by mouth at bedtime 3)  Lisinopril 20  Mg Tabs (Lisinopril) .[name].. 1 by mouth daily 4)  Fish Oil 1000 Mg Caps (Omega-3 fatty acids) .... Take 2 - 3 every day 5)  Simvastatin 20 Mg Tabs (Simvastatin) .... One tab by mouth at night 6)  Carvedilol 6.25 Mg Tabs (Carvedilol) .[name].. 1 two times a day 7)  Bayer Low Strength 81 Mg Tbec (Aspirin) .[name].. 1 daily by mouth 8)  Cvs Suppository (adult) 3 Gm Supp (Glycerin (laxative)) .... One supp per rectum as needed for hard stools.  Patient Instructions: 1)  RTC as needed.  Current Allergies (reviewed today): No known allergies

## 2010-05-03 NOTE — Miscellaneous (Signed)
Summary: Physical Therapy Initial Evaluation/[facility]  Physical Therapy Initial Evaluation/[facility]   Imported By: Maryln Gottron 11/10/2008 15:11:48  _____________________________________________________________________  External Attachment:    Type:   Image     Comment:   External Document

## 2010-05-03 NOTE — Progress Notes (Signed)
----   Converted from flag ---- ---- 12/27/2009 12:55 PM, Crawford Givens MD wrote: cmet 401.1 lipid 401.1 CBC 204.10 PSA v76.49 A1c 790.29  ---- 12/27/2009 12:37 PM, Mills Koller wrote: This patient is scheduled for CPX with you, I need lab orders with dx, please. Thanks, Terri ------------------------------

## 2010-05-03 NOTE — Progress Notes (Signed)
Summary: 90x3 lisinopril rx(schaller pt)  Phone Note Refill Request Call back at Home Phone [phone] Message from:  Patient on January 02, 2007 9:53 AM  Refills Requested: Medication #1:  LISINOPRIL 20 MG  TABS 1 by mouth daily   Supply Requested: 3 months 90 day rx  Initial call taken by: Liane Comber,  January 02, 2007 9:56 AM  Follow-up for Phone Call        done Follow-up by: Cindee Salt MD,  January 02, 2007 10:18 AM  Additional Follow-up for Phone Call Additional follow up Details #1::        phoned pt. to pick up rx Additional Follow-up by: Wandra Mannan,  January 02, 2007 10:20 AM    New/Updated Medications: LISINOPRIL 20 MG  TABS (LISINOPRIL) 1 by mouth daily   Prescriptions: LISINOPRIL 20 MG  TABS (LISINOPRIL) 1 by mouth daily  #90 x 3   Entered by:   Liane Comber   Authorized by:   Cindee Salt MD   Signed by:   Liane Comber on 01/02/2007   Method used:   Print then Give to Patient   RxID:   [id number]     Prior Medications: VALTREX 1 GM TABS (VALACYCLOVIR HCL) 1/2 tab by mouth two times a day BENADRYL 25 MG  CAPS (DIPHENHYDRAMINE HCL) 1 by mouth at bedtime BAYER ASPIRIN 81 MG  TABS (ASPIRIN) () 1 by mouth daily FISH OIL   OIL (FISH OIL) two times a day ZOCOR 20 MG  TABS (SIMVASTATIN) 1 by mouth at bedtime CARVEDILOL 6.25 MG  TABS (CARVEDILOL) 1 bid ANUSOL-HC 25 MG  SUPP (HYDROCORTISONE ACETATE) one per rectum three times a day for one week then as needed Current Allergies: No known allergies

## 2010-05-03 NOTE — Assessment & Plan Note (Signed)
Summary: RECHECK   Vital Signs:  Patient profile:   77 year old male Height:      69.50 inches Weight:      166.13 pounds BMI:     24.27 Temp:     98.4 degrees F oral Pulse rate:   76 / minute Pulse rhythm:   regular BP sitting:   94 / 48  (left arm) Cuff size:   regular  Vitals Entered By: Delilah Shan CMA Duncan Dull) (May 23, 2009 9:03 AM)  Serial Vital Signs/Assessments:  Time      Position  BP       Pulse  Resp  Temp     By                     107/65                         Ruthe Mannan MD  CC: Recheck - not feeling better.   History of Present Illness: 77 yo here for follow up bronchitis. Treated with Zpack, took last dose on Sunday.   Cough has been controlled with Tessalon Perles but still felt run down and congested until yesterday. Felt like he turned the corner yesterday.  BP a little low when he first got here, recheck better. Denies any symptoms of orthostasis.  Adimts to not drinking many fluids and taking a lot of Mucinex over the past week.  No fevers, chills, SOB, CP.  Current Medications (verified): 1)  Valtrex 1 Gm Tabs (Valacyclovir Hcl) .... 1/2 Tab By Mouth Two Times A Day 2)  Benadryl 25 Mg  Caps (Diphenhydramine Hcl) .[name].. 1 By Mouth At Bedtime 3)  Lisinopril 20 Mg  Tabs (Lisinopril) .[name].. 1 By Mouth Daily 4)  Fish Oil 1000 Mg  Caps (Omega-3 Fatty Acids) .... Take 2 - 3 Every Day 5)  Simvastatin 20 Mg Tabs (Simvastatin) .... One Tab By Mouth At Night 6)  Carvedilol 6.25 Mg  Tabs (Carvedilol) .[name].. 1 Two Times A Day 7)  Bayer Low Strength 81 Mg Tbec (Aspirin) .[name].. 1 Daily By Mouth 8)  Cvs Suppository (Adult) 3 Gm Supp (Glycerin (Laxative)) .... One Supp Per Rectum As Needed For Hard Stools. 9)  Tessalon Perles 100 Mg  Caps (Benzonatate) .[name].. 1 Tab By Mouth Three Times A Day As Needed Cough  Allergies (verified): No Known Drug Allergies  Review of Systems      See HPI General:  Denies chills and fever. ENT:  Complains of nasal congestion and  postnasal drainage; denies earache, sinus pressure, and sore throat. CV:  Denies chest pain or discomfort. Resp:  Complains of cough; denies shortness of breath, sputum productive, and wheezing.  Physical Exam  General:  Well-developed,well-nourished,in no acute distress; alert,appropriate and cooperative throughout examination  Ears:  External ear exam shows no significant lesions or deformities.  Otoscopic examination reveals clear canals, tympanic membranes are intact bilaterally without bulging, retraction, inflammation or discharge. Hearing is grossly normal bilaterally. Nose:  mucosal erythema.   Mouth:  Oral mucosa and oropharynx without lesions or exudates.  Teeth in good repair. Lungs:  Normal respiratory effort, chest expands symmetrically. Lungs are clear to auscultation, no crackles or wheezes, improved. Heart:  Normal rate and regular rhythm. S1 and S2 normal without gallop, murmur, click, rub or other extra sounds. Extremities:  no edema Psych:  Cognition and judgment appear intact. Alert and cooperative with normal attention span and concentration. No apparent delusions, illusions,  hallucinations   Impression & Recommendations:  Problem # 1:  ACUTE BRONCHITIS (ICD-466.0) Assessment Improved Continue supporitve care as needed, appears much better. His updated medication list for this problem includes:    Tessalon Perles 100 Mg Caps (Benzonatate) .[name]... 1 tab by mouth three times a day as needed cough  Problem # 2:  HYPERTENSION (ICD-401.9) Assessment: Improved mildy hypotensive today when he first arrived but asymptomatic.  Advised to push fluids, stop mucinex. Still take antihypertensives as prescribed.  Pt to contact us if he becomes symptomatic. His updated medication list for this problem includes:    Lisinopril 20 Mg Tabs (Lisinopril) .[name]... 1 by mouth daily    Carvedilol 6.25 Mg Tabs (Carvedilol) .[name]... 1 two times a day  Complete Medication List: 1)  Valtrex 1 Gm  Tabs (Valacyclovir hcl) .... 1/2 tab by mouth two times a day 2)  Benadryl 25 Mg Caps (Diphenhydramine hcl) .[name].. 1 by mouth at bedtime 3)  Lisinopril 20 Mg Tabs (Lisinopril) .[name].. 1 by mouth daily 4)  Fish Oil 1000 Mg Caps (Omega-3 fatty acids) .... Take 2 - 3 every day 5)  Simvastatin 20 Mg Tabs (Simvastatin) .... One tab by mouth at night 6)  Carvedilol 6.25 Mg Tabs (Carvedilol) .[name].. 1 two times a day 7)  Bayer Low Strength 81 Mg Tbec (Aspirin) .[name].. 1 daily by mouth 8)  Cvs Suppository (adult) 3 Gm Supp (Glycerin (laxative)) .... One supp per rectum as needed for hard stools. 9)  Tessalon Perles 100 Mg Caps (Benzonatate) .[name].. 1 tab by mouth three times a day as needed cough  Current Allergies (reviewed today): No known allergies

## 2010-05-03 NOTE — Progress Notes (Signed)
Summary: pt needs 90 day scripts  Phone Note Refill Request Call back at [phone] Message from:  Patient  Refills Requested: Medication #1:  CARVEDILOL 6.25 MG  TABS 1 two times a day  Medication #2:  SIMVASTATIN 20 MG TABS one tab by mouth at night  Medication #3:  LISINOPRIL 20 MG  TABS 1 by mouth daily Pt needs 90 day scripts for mail order, please call when ready.  Initial call taken by: Lowella Petties CMA,  November 15, 2008 10:29 AM  Follow-up for Phone Call        Pt to pick up Follow-up by: Lowella Petties CMA,  November 15, 2008 11:36 AM    Prescriptions: CARVEDILOL 6.25 MG  TABS (CARVEDILOL) 1 two times a day  #180 x 3   Entered by:   Providence Crosby LPN   Authorized by:   Shaune Leeks MD   Signed by:   Providence Crosby LPN on 11/91/4782   Method used:   Print then Give to Patient   RxID:   [id number] SIMVASTATIN 20 MG TABS (SIMVASTATIN) one tab by mouth at night  #90 x 3   Entered by:   Providence Crosby LPN   Authorized by:   Shaune Leeks MD   Signed by:   Providence Crosby LPN on 29/52/8413   Method used:   Print then Give to Patient   RxID:   [id number] LISINOPRIL 20 MG  TABS (LISINOPRIL) 1 by mouth daily  #90 x 3   Entered by:   Providence Crosby LPN   Authorized by:   Shaune Leeks MD   Signed by:   Providence Crosby LPN on 34/74/2595   Method used:   Print then Give to Patient   RxID:   [id number]

## 2010-05-03 NOTE — Miscellaneous (Signed)
Summary: Orders Update  Clinical Lists Changes  Orders: Added new Test order of Carotid Duplex (Carotid Duplex) - Signed 

## 2010-05-03 NOTE — Assessment & Plan Note (Signed)
Summary: 6 MTH F/U/CLE  R/S FROM 12/13/08   Vital Signs:  Patient profile:   77 year old male Weight:      167 pounds Temp:     98.0 degrees F oral Pulse rate:   60 / minute Pulse rhythm:   regular BP sitting:   110 / 60  (left arm) Cuff size:   regular  Vitals Entered By: Sydell Axon LPN (December 21, 2008 8:43 AM) CC: 6 Month follow-up   History of Present Illness: Pt here for Comp Exam, having sporadic pain in left flank, worst with lying down. From location, he thought was kidney stone. He has had many stones in the past. He can feel  Had sciatica and saw Chiropracter with no help, then saw Dr Patsy Lager and sent to PT which resolved things. He sees Dr Jones Broom this PM.  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 1/2 Tab By Mouth Two Times A Day 2)  Benadryl 25 Mg  Caps (Diphenhydramine Hcl) .[name].. 1 By Mouth At Bedtime 3)  Lisinopril 20 Mg  Tabs  (Lisinopril) .[name].. 1 By Mouth Daily 4)  Fish Oil 1000 Mg  Caps (Omega-3 Fatty Acids) .... Take 2 - 3 Every Day 5)  Simvastatin 20 Mg Tabs (Simvastatin) .... One Tab By Mouth At Night 6)  Carvedilol 6.25 Mg  Tabs (Carvedilol) .[name].. 1 Two Times A Day 7)  Bayer Low Strength 81 Mg Tbec (Aspirin) .[name].. 1 Daily By Mouth 8)  Anusol-Hc 25 Mg Supp (Hydrocortisone Acetate) .... One Pr Three Times A Day As Needed 9)  Diltiazem Hcl  Powd (Diltiazem Hcl) .... 2% Apply Cream Twice A Day 10)  Levitra 20 Mg Tabs (Vardenafil Hcl) .... One Tab By Mouth One Hour Prior To Desired Intercourse. 11)  Cvs Suppository (Adult) 3 Gm Supp (Glycerin (Laxative)) .... One Supp Per Rectum As Needed For Hard Stools.  Allergies: No Known Drug Allergies  Past History:  Past Medical History: Last updated: 10/24/2008 Coronary artery disease :(S/P CABG X 4 04/2001) ZOXW:(9604'V) Hyperlipidemia:(2002) Hypertension:(2002) CLL  Past Surgical History: Last updated: 12/21/2007 HEP A ([facility], [place]) 1954 HEMMORHOIDECTOMY/ FISSURE REPAIOR ([place]) 1954 KIDNEY STONE REMOVAL (DR BATES) 1977 LITHOTRIPSY MULTIP 1990s ANGIOPLASTY :(1998) CATHERIZATION WITH ANGIOPLASTY:(1998) CABG X4 (DR. GEARHART):(04/23/2001) EGD; GASTRITIS :(11/29/1987) COLONOSCOPYNORMAL:(11/29/1987) COLONOSCOPY; HEMM. INTERNAL ,FOCAL PROCTITIS ;  NEGATIVE BIOPSY:(02/07/2003) COLONOSCOPY; INTERNAL EXTERNAL HEMORRHOIDS ;+PROCTITIS , NEGATIVE BIOPSY:(12/12/2004) ETT/Persantine Myoview nml ( 12/10/2006) Cataracts R 1/09  L 8/09  Family History: Last updated: December 23, 2006 Father: DECEASED 65 YOA +HTN, NATURAL CAUSES BEDRIDDEN X 2 YEARS  Mother: DECEASED 30 YOA "HEART" , + HTN Siblings: 1 BROTHER DECEASED 63YOA ETOH DISEASE --FAILURE                1 BROTHER DECEASED 55 YOA MI, STROKE                 1 BROTHER DECEASED 27YOA DROWNING                 1  1/2 BROTHER SHOT TO DEATH  1 SISTER DECEASED 31 YOA RENAL CELL CANCER CV: + MOTHER, +BROTHER HBP: +MOTHER, +  FATHER DM: + SISTER, SISTERS DAUGHTER GOUT/ARTHRITIS: BREAST/OVARIAN/UTERINE CANCER:: + SISTER RENAL CELL CANCER COLON CANCER: DEPRESSION: + DAUGHTER (BIPOLAR) ETOH: +BROTHER OTHER: + STROKE BROTHER  Social History: Last updated: Dec 23, 2006 Marital Status: Married LIVES WITH WIFE Children:  1 SON DIED LUNG CANCER 40 YOA// 1 DAUGHTER BIPOLAR LIVES   ETOH  Occupation: RETIRED MACHINIST:   Risk Factors: Alcohol Use: 0 (12/21/2008) Caffeine Use: 4 (12/21/2008) Exercise: yes (12/21/2008)  Risk Factors: Smoking Status: quit (12/21/2008) Packs/Day: 1979 (12/21/2008) Passive Smoke Exposure: no (12/21/2008)  Social History: Caffeine use/day:  4  Review of Systems General:  Denies chills, fatigue, fever, loss of appetite, malaise, sleep disorder, sweats, weakness, and weight loss. Eyes:  Denies blurring, discharge, double vision, eye irritation, eye pain, halos, itching, light sensitivity, red eye, vision loss-1 eye, and vision loss-both eyes; floaters in the left eye.. ENT:  Complains of decreased hearing; denies difficulty swallowing, ear discharge, earache, hoarseness, nasal congestion, nosebleeds, postnasal drainage, ringing in ears, sinus pressure, and sore throat; hearing aids bilat.. CV:  Denies bluish discoloration of lips or nails, chest pain or discomfort, difficulty breathing at night, difficulty breathing while lying down, fainting, fatigue, leg cramps with exertion, lightheadness, near fainting, palpitations, shortness of breath with exertion, swelling of feet, swelling of hands, and weight gain. Resp:  Denies chest discomfort, chest pain with inspiration, cough, coughing up blood, excessive snoring, hypersomnolence, morning headaches, pleuritic, shortness of breath, sputum productive, and wheezing. GI:  Denies abdominal pain, bloody stools, change in bowel habits, constipation, dark tarry stools, diarrhea, excessive appetite, gas, hemorrhoids, indigestion, loss of appetite,  nausea, vomiting, vomiting blood, and yellowish skin color. GU:  Complains of nocturia; denies decreased libido, discharge, dysuria, erectile dysfunction, genital sores, hematuria, incontinence, urinary frequency, and urinary hesitancy; two, unchanged.. MS:  Complains of joint pain and low back pain; denies joint redness, joint swelling, loss of strength, mid back pain, muscle aches, muscle , cramps, muscle weakness, stiffness, and thoracic pain; chronic. Derm:  Denies changes in color of skin, changes in nail beds, dryness, excessive perspiration, flushing, hair loss, insect bite(s), itching, lesion(s), poor wound healing, and rash.   Impression & Recommendations:  Problem # 1:  BACK PAIN, LUMBAR (ICD-724.2) Assessment Improved Cont exercises shown at PT. His updated medication list for this problem includes:    Bayer Low Strength 81 Mg Tbec (Aspirin) .[name]... 1 daily by mouth  Problem # 2:  SCREENING FOR MALIGNANT NEOPLASM, PROSTATE (ICD-V76.44) Stable PSA and exam.  Problem # 3:  HYPERGLYCEMIA (ICD-790.29) Assessment: Improved Euglycemic today.  Problem # 4:  HYPERTENSION (ICD-401.9)  His updated medication list for this problem includes:    Lisinopril 20 Mg Tabs (Lisinopril) .[name]... 1 by mouth daily  Carvedilol 6.25 Mg Tabs (Carvedilol) .[name]... 1 two times a day  Problem # 5:  HYPERLIPIDEMIA (ICD-272.4)  His updated medication list for this problem includes:    Simvastatin 20 Mg Tabs (Simvastatin) ..... One tab by mouth at night  Problem # 6:  GERD (ICD-530.81)  Problem # 7:  CORONARY ARTERY DISEASE (ICD-414.00) Assessment: Unchanged Stable. His updated medication list for this problem includes:    Lisinopril 20 Mg Tabs (Lisinopril) .[name]... 1 by mouth daily    Carvedilol 6.25 Mg Tabs (Carvedilol) .[name]... 1 two times a day    Bayer Low Strength 81 Mg Tbec (Aspirin) .[name]... 1 daily by mouth  Problem # 8:  LEUKEMIA, LYMPHOCYTIC, CHRONIC (DR KAHN) (ICD-204.10) Assessment: Unchanged  Continues to smolder. Followed by Dr Park Breed.  Problem # 9:  LESION, SCALP (ICD-238.2) Assessment: New RTC for appt to remove. Has h/o BCC.  Complete Medication List: 1)  Valtrex 1 Gm Tabs (Valacyclovir hcl) .... 1/2 tab by mouth two times a day 2)  Benadryl 25 Mg Caps (Diphenhydramine hcl) .[name].. 1 by mouth at bedtime 3)  Lisinopril 20 Mg Tabs (Lisinopril) .[name].. 1 by mouth daily 4)  Fish Oil 1000 Mg Caps (Omega-3 fatty acids) .... Take 2 - 3 every day 5)  Simvastatin 20 Mg Tabs (Simvastatin) .... One tab by mouth at night 6)  Carvedilol 6.25 Mg Tabs (Carvedilol) .[name].. 1 two times a day 7)  Bayer Low Strength 81 Mg Tbec (Aspirin) .[name].. 1 daily by mouth 8)  Cvs Suppository (adult) 3 Gm Supp (Glycerin (laxative)) .... One supp per rectum as needed for hard stools.  Patient Instructions: 1)  RTC for lesion excision from scalp unless prefers to have Derm do it.  Current Allergies (reviewed today): No known allergies   Laboratory Results   Urine Tests  Date/Time Received: December 21, 2008 9:33 AM  Date/Time Reported: December 21, 2008 9:33 AM   Routine Urinalysis   Color: yellow Appearance: Hazy Glucose: negative   (Normal Range: Negative) Bilirubin: negative   (Normal Range: Negative) Ketone: negative   (Normal Range: Negative) Spec. Gravity: 1.010   (Normal Range: 1.003-1.035) Blood: small   (Normal Range: Negative) pH: 6.0   (Normal Range: 5.0-8.0) Protein: trace   (Normal Range: Negative) Urobilinogen: 0.2   (Normal Range: 0-1) Nitrite: negative   (Normal Range: Negative) Leukocyte Esterace: negative   (Normal Range: Negative)

## 2010-05-03 NOTE — Assessment & Plan Note (Signed)
Summary: BUG BITE ON ANKLE, ANKLE HURTING REAL BAD/JRR   Vital Signs:  Patient profile:   77 year old male Height:      69.50 inches Weight:      168.25 pounds BMI:     24.58 Temp:     98.2 degrees F oral Pulse rate:   64 / minute Pulse rhythm:   regular BP sitting:   116 / 62  (left arm) Cuff size:   regular  Vitals Entered By: Delilah Shan CMA  Dull) (November 27, 2009 11:53 AM) CC: Bug bite on ankle.  Currently on Sulfameth/TMP DS twice daily from [facility]   History of Present Illness: Pt with lesion on R ankle on Friday.  Blister formed.  Was seen at ER 8/27 and recs reviewed from [facility]  Started on septra DS 1 by mouth two times a day.  Since then patient with increase in pain and edema at ankle.  Blister persists. No recent tick bites, no new foods.  No known recent insect bites except for some mosquito bites that are healing above the sock line.   Allergies: No Known Drug Allergies  Review of Systems       See HPI.  Otherwise negative.    Physical Exam  General:  NAD RRR CTAB R ankle with  ~1cm blister that appears to have some cloudy fluid in it.  Blister is on lat/inferior portion of ankle.  Lat mal is tender to palpation but w/o focal fluctuance.  distally nv intact.    Impression & Recommendations:  Problem # 1:  CELLULITIS, ANKLE, RIGHT (ICD-682.6) Will increase septra to 2 tabs by mouth two times a day  and cx wound.  follow up as needed.  I d/w patient rational to culture the fluid.  Verbal informed consent obtained, including risk of infection/bleeding/damage to nearby organs.  Area cleaned with alcohol blister punctured with 21g needle.  Fluid expressed- mostly clear- this was collected for cx.  Covered wtih bandaid and neosporin.  tolerated well.  No complications.  The following medications were removed from the medication list:    Doxycycline Hyclate 100 Mg Caps (Doxycycline hyclate) ..... One tab by mouth two times a day His updated medication list for this  problem includes:    Septra Ds 800-160 Mg Tabs (Sulfamethoxazole-trimethoprim) .[name]... 2 by mouth two times a day x5 days  Orders: T-Culture, Wound ([id number])  Complete Medication List: 1)  Valtrex 1 Gm Tabs (Valacyclovir hcl) .... 1/2 tab by mouth two times a day 2)  Benadryl 25 Mg Caps (Diphenhydramine hcl) .[name].. 1 by mouth at bedtime 3)  Lisinopril 20 Mg Tabs (Lisinopril) .[name].. 1 by mouth daily 4)  Fish Oil 1000 Mg Caps (Omega-3 fatty acids) .... Take 2 - 3 every day 5)  Simvastatin 20 Mg Tabs (Simvastatin) .... One tab by mouth at night 6)  Carvedilol 6.25 Mg Tabs (Carvedilol) .[name].. 1 two times a day 7)  Bayer Low Strength 81 Mg Tbec (Aspirin) .[name].. 1 daily by mouth 8)  Cvs Suppository (adult) 3 Gm Supp (Glycerin (laxative)) .... One supp per rectum as needed for hard stools. 9)  Levitra 20 Mg Tabs (Vardenafil hcl) .... One tab by mouth one hour prior 10)  Septra Ds 800-160 Mg Tabs (Sulfamethoxazole-trimethoprim) .... 2 by mouth two times a day x5 days  Patient Instructions: 1)  Keep the blister clean and covered with a bandaid and neosporin.  Take septra DS 2 tabs by mouth two times a day x10days total.  Let  me know if it isn't getting better.  Take care. Prescriptions: SEPTRA DS 800-160 MG TABS (SULFAMETHOXAZOLE-TRIMETHOPRIM) 2 by mouth two times a day x5 days  #20 x 0   Entered and Authorized by:   Crawford Givens MD   Signed by:   Crawford Givens MD on 11/27/2009   Method used:   Print then Give to Patient   RxID:   [id number]   Current Allergies (reviewed today): No known allergies

## 2010-05-03 NOTE — Assessment & Plan Note (Signed)
Summary: HEADCOLD/DLO   Vital Signs:  Patient Profile:   77 Years Old Male Height:     69.50 inches (176.53 cm) Weight:      171 pounds Temp:     98.7 degrees F oral Pulse rate:   72 / minute Pulse rhythm:   regular BP sitting:   100 / 70  (left arm) Cuff size:   regular  Vitals Entered By: Sydell Axon (May 05, 2008 8:44 AM)                 Chief Complaint:  Head congestion and productive cough green-yellow.  History of Present Illness: Here for URI signs--nasal congestion, ST, no fever/chills, cough producing yellow to green mucus--keeps wife awake x 1wk --taking nyquil and guifenisen and dayquil--is aware of caution re HBP --wife sick 2 wks ago    Current Allergies (reviewed today): No known allergies   Past Medical History:    Reviewed history from 12/04/2006 and no changes required:       Coronary artery disease :(S/P CABG X 4 04/2001)       GMWN:(0272'Z)       Hyperlipidemia:(2002)       Hypertension:(2002)     Review of Systems      See HPI   Physical Exam  General:     alert, well-developed, well-nourished, and well-hydrated.   Ears:     R ear normal and L ear normal.   Nose:     no mucosal edema, no airflow obstruction, and mucosal erythema.  sinuses neg Mouth:     no exudates and pharyngeal erythema.   Lungs:     normal respiratory effort, no intercostal retractions, no accessory muscle use, normal breath sounds, no crackles, and no wheezes.   Cervical Nodes:     no anterior cervical adenopathy and no posterior cervical adenopathy.   Psych:     normally interactive and good eye contact.      Impression & Recommendations:  Problem # 1:  URI (ICD-465.9) Assessment: New continue comfort care measures: increase po fluids, rest, tylenol or IBP as needed will use hycodan at hs as needed cough will continue current OTCs as needed call if not resolved in 5-7d His updated medication list for this problem includes:    Benadryl 25 Mg Caps  (Diphenhydramine hcl) .[name]... 1 by mouth at bedtime    Bayer Low Strength 81 Mg Tbec (Aspirin) .[name]... 1 daily by mouth   Complete Medication List: 1)  Valtrex 1 Gm Tabs (Valacyclovir hcl) .... 1/2 tab by mouth two times a day 2)  Benadryl 25 Mg Caps (Diphenhydramine hcl) .[name].. 1 by mouth at bedtime 3)  Lisinopril 20 Mg Tabs (Lisinopril) .[name].. 1 by mouth daily 4)  Fish Oil 1000 Mg Caps (Omega-3 fatty acids) .... Take 2 - 3 every day 5)  Simvastatin 20 Mg Tabs (Simvastatin) .... One tab by mouth at night 6)  Carvedilol 6.25 Mg Tabs (Carvedilol) .[name].. 1 two times a day 7)  Bayer Low Strength 81 Mg Tbec (Aspirin) .[name].. 1 daily by mouth 8)  Anusol-hc 25 Mg Supp (Hydrocortisone acetate) .... One pr three times a day as needed 9)  Diltiazem Hcl Powd (Diltiazem hcl) .... 2% apply cream twice a day 10)  Hycodan  .[name].. 1 tsp at bedtime as needed cough, may repeat in 4-6hrs if needed    Prescriptions: HYCODAN 1 tsp at bedtime as needed cough, may repeat in 4-6hrs if needed  #140ml x 0  Entered and Authorized by:   Gildardo Griffes FNP   Signed by:   Gildardo Griffes FNP on 05/05/2008   Method used:   Print then Give to Patient   RxID:   [id number]   Current Allergies (reviewed today): No known allergies

## 2010-05-03 NOTE — Assessment & Plan Note (Signed)
Summary: TICK/DLO   Vital Signs:  Patient Profile:   77 Years Old Male Height:     69.50 inches (176.53 cm) Weight:      168 pounds Temp:     98.2 degrees F oral Pulse rate:   63 / minute BP sitting:   101 / 68  (left arm) Cuff size:   regular  Vitals Entered By: Cooper Render (November 04, 2007 10:02 AM)                 Chief Complaint:  DEER TICK BITE R) CALF and MOUTHPIECE STILL EMBEDDED IN LEG.  History of Present Illness: Here for tick bite with head still in R calf--pulled tick off yesterday.  Has bruised circle around bite.  No pain, fever, HA at this time.    Current Allergies (reviewed today): No known allergies      Review of Systems      See HPI   Physical Exam  General:     alert, well-developed, well-nourished, and well-hydrated.   Skin:     examination of bite area under light and magnification reveals no foreign body present, pinpoint crust that is tan Psych:     normally interactive.      Impression & Recommendations:  Problem # 1:  TICK BITE (ICD-E906.4) Assessment: New allowed  Mr Ritson to look at bite area with magnification and light--agrees is clean will keep area clean and dry reviewed S&S of Lyme's and RMSF as well as incubation period and possible needed tx--will come in if developes sx   Complete Medication List: 1)  Valtrex 1 Gm Tabs (Valacyclovir hcl) .... 1/2 tab by mouth two times a day 2)  Benadryl 25 Mg Caps (Diphenhydramine hcl) .[name].. 1 by mouth at bedtime 3)  Bayer Aspirin 81 Mg Tabs (aspirin)  .[name].. 1 by mouth daily 4)  Lisinopril 20 Mg Tabs (Lisinopril) .[name].. 1 by mouth daily 5)  Fish Oil 1000 Mg Caps (Omega-3 fatty acids) .... Take 2 - 3 every day 6)  Zocor 20 Mg Tabs (Simvastatin) .[name].. 1 by mouth at bedtime 7)  Carvedilol 6.25 Mg Tabs (Carvedilol) .[name].. 1 two times a day    ] Prior Medications (reviewed today): VALTREX 1 GM TABS (VALACYCLOVIR HCL) 1/2 tab by mouth two times a day BENADRYL 25 MG  CAPS  (DIPHENHYDRAMINE HCL) 1 by mouth at bedtime BAYER ASPIRIN 81 MG  TABS (ASPIRIN) () 1 by mouth daily LISINOPRIL 20 MG  TABS (LISINOPRIL) 1 by mouth daily FISH OIL 1000 MG  CAPS (OMEGA-3 FATTY ACIDS) TAKE 2 - 3 EVERY DAY ZOCOR 20 MG  TABS (SIMVASTATIN) 1 by mouth at bedtime CARVEDILOL 6.25 MG  TABS (CARVEDILOL) 1 two times a day Current Allergies (reviewed today): No known allergies

## 2010-05-03 NOTE — Miscellaneous (Signed)
Summary: [facility] [place]  Physical [facility]   Imported By: Beau Fanny 12/26/2008 16:34:58  _____________________________________________________________________  External Attachment:    Type:   Image     Comment:   External Document

## 2010-05-03 NOTE — Letter (Signed)
Summary: [facility]  [facility]   Imported By: Lanelle Bal 03/15/2010 11:58:08  _____________________________________________________________________  External Attachment:    Type:   Image     Comment:   External Document

## 2010-05-03 NOTE — Assessment & Plan Note (Signed)
Summary: PROBLEM WITH VISION/RI   Vital Signs:  Patient profile:   77 year old male Weight:      170 pounds Temp:     98.5 degrees F oral Pulse rate:   60 / minute Pulse rhythm:   regular BP sitting:   142 / 80  (left arm) Cuff size:   regular  Vitals Entered By: Sydell Axon LPN (May 01, 2009 3:13 PM) CC: ? Mini stroke last Thursday, had blind spots in his vision, better now   History of Present Illness: Pt thinks he had a "mini stroke". He had "blind spots" in his vision...Marland Kitchenhe had spots in areas of his visual field that were gray, no focus...he had zigzags of white light through the field....lasted . He  recovered enough to drive home in about 20 mins. His sxs were then totally gone. He had this same effect many years ago which lasted a shorter amt of time, approx 5 mins with no probs since, until now. He has had CT of head in past but he is not sure of when and doesn't remember where. This was before  I statred taking care of him.  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 1/2 Tab By Mouth Two Times A Day 2)  Benadryl 25 Mg  Caps (Diphenhydramine Hcl) .[name].. 1 By Mouth At Bedtime 3)  Lisinopril 20 Mg  Tabs (Lisinopril) .[name].. 1 By Mouth Daily 4)  Fish Oil 1000 Mg  Caps (Omega-3 Fatty Acids)  .... Take 2 - 3 Every Day 5)  Simvastatin 20 Mg Tabs (Simvastatin) .... One Tab By Mouth At Night 6)  Carvedilol 6.25 Mg  Tabs (Carvedilol) .[name].. 1 Two Times A Day 7)  Bayer Low Strength 81 Mg Tbec (Aspirin) .[name].. 1 Daily By Mouth 8)  Cvs Suppository (Adult) 3 Gm Supp (Glycerin (Laxative)) .... One Supp Per Rectum As Needed For Hard Stools.  Allergies: No Known Drug Allergies  Physical Exam  General:  Well-developed,well-nourished,in no acute distress; alert,appropriate and cooperative throughout examination Head:  Normocephalic and atraumatic without obvious abnormalities.  Eyes:  Conjunctiva clear bilaterally.  Ears:  R ear normal and L ear normal.   Nose:  no mucosal edema, no airflow obstruction, and mucosal erythema.  sinuses neg Mouth:  no exudates and pharyngeal erythema.   Neck:  No deformities, masses, or tenderness noted. Chest Wall:  No deformities, masses, tenderness or gynecomastia noted. Lungs:  Normal respiratory effort, chest expands symmetrically. Lungs are clear to auscultation, no crackles or wheezes. Heart:  Normal rate and regular rhythm. S1 and S2 normal without gallop, murmur, click, rub or other extra sounds. Msk:  Gait nml. On and off exam table nmlly. Neurologic:  alert & oriented X3, cranial nerves II-XII intact, strength normal in all extremities, sensation intact to light touch, gait normal, finger-to-nose  normal, heel-to-shin normal, and Romberg negative.   Skin:  Intact without suspicious lesions or rashes Psych:  Cognition and judgment appear intact. Alert and cooperative with normal attention span and concentration. No apparent delusions, illusions, hallucinations   Impression & Recommendations:  Problem # 1:  UNSPECIFIED SUBJECTIVE VISUAL DISTURBANCE (ICD-368.10) Assessment New Suggest increasing EASA to 81mg  x 2. Suggest seeing Dr Hazle Quant. Discussed going tio ER for any neurologic sxs which we discussed.  Complete Medication List: 1)  Valtrex 1 Gm  Tabs (Valacyclovir hcl) .... 1/2 tab by mouth two times a day 2)  Benadryl 25 Mg Caps (Diphenhydramine hcl) .[name].. 1 by mouth at bedtime 3)  Lisinopril 20 Mg Tabs (Lisinopril) .[name].. 1 by mouth daily 4)  Fish Oil 1000 Mg Caps (Omega-3 fatty acids) .... Take 2 - 3 every day 5)  Simvastatin 20 Mg Tabs (Simvastatin) .... One tab by mouth at night 6)  Carvedilol 6.25 Mg Tabs (Carvedilol) .[name].. 1 two times a day 7)  Bayer Low Strength 81 Mg Tbec (Aspirin) .[name].. 1 daily by mouth 8)  Cvs Suppository (adult) 3 Gm Supp (Glycerin (laxative)) .... One supp per rectum as needed for hard stools.  Patient Instructions: 1)  RTC for recurrence but best to go to ER for CVA emergency.  Current Allergies (reviewed today): No known allergies

## 2010-05-03 NOTE — Progress Notes (Signed)
Summary: Rx Valtrex  Phone Note Call from Patient Call back at [phone]   Caller: Patient Call For: Dr. Hetty Ely Summary of Call: Pt needs written rx for his Valtrex for a year.  Call when ready to be picked up.  Initial call taken by: Sydell Axon,  July 23, 2007 9:23 AM  Follow-up for Phone Call        patient notified to pick up prescription Follow-up by: Providence Crosby,  July 23, 2007 10:04 AM      Prescriptions: VALTREX 1 GM TABS (VALACYCLOVIR HCL) 1/2 tab by mouth two times a day  #90 x 3   Entered and Authorized by:   Shaune Leeks MD   Signed by:   Shaune Leeks MD on 07/23/2007   Method used:   Print then Give to Patient   RxID:   [id number]

## 2010-05-03 NOTE — Assessment & Plan Note (Signed)
Summary: REMOVE TICK FROM SHOULDER/CLE   Vital Signs:  Patient profile:   77 year old male Weight:      165.25 pounds Temp:     98.4 degrees F oral Pulse rate:   64 / minute Pulse rhythm:   regular BP sitting:   112 / 68  (left arm) Cuff size:   regular  Vitals Entered By: Sydell Axon LPN (Aug 01, 1608 11:45 AM) CC: Remove tick from right shoulder   History of Present Illness: Pt here for removal of remainder of tick that he got on his right shoulder on Sat, he thinks, while fishing in [facility] [place] [employer]. His nephew tried to pull it off with forceps but a part was left in which he could not remove. It has been itchy since.   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 1/2 Tab By Mouth Two Times A Day 2)  Benadryl 25 Mg  Caps (Diphenhydramine Hcl) .[name].. 1 By Mouth At Bedtime 3)  Lisinopril 20 Mg  Tabs (Lisinopril) .[name].. 1 By Mouth Daily 4)  Fish Oil 1000 Mg  Caps (Omega-3 Fatty Acids) .... Take 2 - 3 Every Day 5)  Simvastatin 20 Mg Tabs (Simvastatin) .... One Tab By Mouth At Night 6)  Carvedilol 6.25 Mg  Tabs (Carvedilol) .[name].. 1 Two Times A Day 7)  Bayer Low Strength 81 Mg Tbec (Aspirin) .[name].. 1  Daily By Mouth 8)  Cvs Suppository (Adult) 3 Gm Supp (Glycerin (Laxative)) .... One Supp Per Rectum As Needed For Hard Stools. 9)  Tessalon Perles 100 Mg  Caps (Benzonatate) .[name].. 1 Tab By Mouth Three Times A Day As Needed Cough 10)  Levitra 20 Mg Tabs (Vardenafil Hcl) .... One Tab By Mouth One Hour Prior  Allergies: No Known Drug Allergies  Physical Exam  Skin:  Small raies papular area on the top of the right shoulderapprox misclavicular line with dark insect part imbedded in the center of the swollen area. Part of the insect was removed with forceps and probe but had to anesthetize the arwa with 1%lido w/ epi after sterile prep and then make small incision over the area to remove the last miniscule remnant. No other part seen. Neosporin and sterile dressing placed. Tolerated well w/o complication. Virtually no bleeding.   Impression & Recommendations:  Problem # 1:  TICK BITE/REMOVAL (ICD-E906.4) Assessment New Because part imbedded longer than 24 hrs, will cover with Doxycycline for 10 days. RTC if sxs don't resolve.  Complete Medication List: 1)  Valtrex 1 Gm Tabs (Valacyclovir hcl) .... 1/2 tab by mouth two times a day 2)  Benadryl 25 Mg Caps (Diphenhydramine hcl) .[name].. 1 by mouth at bedtime 3)  Lisinopril 20 Mg Tabs (Lisinopril) .Marland KitchenMarland KitchenMarland Kitchen  1 by mouth daily 4)  Fish Oil 1000 Mg Caps (Omega-3 fatty acids) .... Take 2 - 3 every day 5)  Simvastatin 20 Mg Tabs (Simvastatin) .... One tab by mouth at night 6)  Carvedilol 6.25 Mg Tabs (Carvedilol) .[name].. 1 two times a day 7)  Bayer Low Strength 81 Mg Tbec (Aspirin) .[name].. 1 daily by mouth 8)  Cvs Suppository (adult) 3 Gm Supp (Glycerin (laxative)) .... One supp per rectum as needed for hard stools. 9)  Levitra 20 Mg Tabs (Vardenafil hcl) .... One tab by mouth one hour prior 10)  Doxycycline Hyclate 100 Mg Caps (Doxycycline hyclate) .... One tab by mouth two times a day  Patient Instructions: 1)  RTC if sxs don't  resolve. Prescriptions: DOXYCYCLINE HYCLATE 100 MG CAPS (DOXYCYCLINE HYCLATE) one tab by mouth two times a day  #20 x 0   Entered and Authorized by:   Shaune Leeks MD   Signed by:   Shaune Leeks MD on 08/01/2009   Method used:   Electronically to        [facility] [facility] [employer] [phone]* (retail)       [address] [place], [place]  [address]       Ph: [phone] or [phone]       Fax: [phone]   RxID:   [id number]   Current Allergies (reviewed today): No known allergies

## 2010-05-03 NOTE — Assessment & Plan Note (Signed)
Summary: F1Y/AMD   Visit Type:  Follow-up Primary Provider:  Shaune Leeks MD  CC:  c/o decreased BP and dizziness.[name]  History of Present Illness: Jimmy [name] is a delightful 77 year old male with a history of CLL, coronary artery disease, status post bypass surgery in 2003. He had a Myoview in September 2008, which showed ejection fraction of 63% with no ischemia or infarct.  He also has a history of hyperlipidemia, hypertension,  carotid stensosis and minimal bilateral renal artery stenosis.   Echo 10/09 EF 55-65% mild diastolic dysfunction  Carotid u/s 4/11: RICA 60-79% LICA 0-39%  He returns today for routine followup. Doing well. Remains very active but hasn't worked out for a week due to a blister or bug bite on his right ankle. Saw Dr. Para March Monday who drained it. Cx apparently negative.  Placed on septra. Typically exercises 5 days /week at [facility]. Rides bike, treadmill, crunches, etc. No CP or SOB.  SBP typically in low 100s. This past week has been intermittmently dizzy with a couple BP readings < 100. Has been drinking enough fluids. No focal neuro deficits. No f/c.      Current Medications (verified): 1)  Valtrex 1 Gm Tabs (Valacyclovir Hcl) .... 1/2 Tab By Mouth Two Times A Day 2)  Benadryl 25 Mg  Caps (Diphenhydramine Hcl) .[name].. 1 By Mouth At Bedtime 3)  Lisinopril 20 Mg  Tabs (Lisinopril) .[name].. 1 By Mouth Daily 4)  Fish Oil 1000 Mg  Caps (Omega-3 Fatty Acids) .... Take 2 - 3 Every Day 5)  Simvastatin 20 Mg Tabs (Simvastatin) .... One Tab By Mouth At Night 6)  Carvedilol 6.25 Mg  Tabs (Carvedilol) .[name].. 1 Two Times A Day 7)  Bayer Low Strength 81 Mg Tbec (Aspirin) .... 2 Daily By Mouth 8)  Cvs Suppository (Adult) 3 Gm Supp (Glycerin (Laxative)) .... One Supp Per Rectum As Needed For Hard Stools. 9)  Levitra 20 Mg Tabs (Vardenafil Hcl) .... One Tab By Mouth One Hour Prior 10)  Septra Ds 800-160 Mg Tabs (Sulfamethoxazole-Trimethoprim) .... 2 By Mouth Two Times A Day X5  Days  Allergies (verified): No Known Drug Allergies  Past History:  Past Medical History: Last updated: 11/02/2009 Coronary artery disease :(S/P CABG X 4 04/2001)    Myoview 2008 EF 63% normal perfusion GERD:(1990'S) Hyperlipidemia:(2002) Hypertension:(2002) CLL- Dr. Welton Flakes  Past Surgical History: Last updated: 12/21/2007 HEP A ([facility], [place]) 1954 HEMMORHOIDECTOMY/ FISSURE REPAIOR ([place]) 1954 KIDNEY STONE REMOVAL (DR [facility]ATES) 1977 LITHOTRIPSY MULTIP 1990s ANGIOPLASTY :(1998) CATHERIZATION WITH ANGIOPLASTY:(1998) CABG X4 (DR. GEARHART):(04/23/2001) EGD; GASTRITIS :(11/29/1987) COLONOSCOPYNORMAL:(11/29/1987) COLONOSCOPY; HEMM. INTERNAL ,FOCAL PROCTITIS ; NEGATIVE BIOPSY:(02/07/2003) COLONOSCOPY; INTERNAL EXTERNAL HEMORRHOIDS ;+PROCTITIS , NEGATIVE BIOPSY:(12/12/2004) ETT/Persantine Myoview nml ( 12/10/2006) Cataracts R 1/09  L 8/09  Family History: Last updated: 12/10/06 Father: DECEASED 25 YOA +HTN, NATURAL CAUSES BEDRIDDEN X 2 YEARS  Mother: DECEASED 18 YOA "HEART" , + HTN Siblings: 1 BROTHER DECEASED 63YOA ETOH DISEASE --FAILURE                1 BROTHER DECEASED 55 YOA MI, STROKE                 1 BROTHER DECEASED 27YOA DROWNING                 1  1/2 BROTHER SHOT TO DEATH  1 SISTER DECEASED 76 YOA RENAL CELL CANCER CV: + MOTHER, +BROTHER HBP: +MOTHER, + FATHER DM: + SISTER, SISTERS DAUGHTER GOUT/ARTHRITIS: BREAST/OVARIAN/UTERINE CANCER:: + SISTER RENAL CELL CANCER COLON CANCER:  DEPRESSION: + DAUGHTER (BIPOLAR) ETOH: +BROTHER OTHER: + STROKE BROTHER  Social History: Last updated: 12/04/2006 Marital Status: Married LIVES WITH WIFE Children:  1 SON DIED LUNG CANCER 40 YOA// 1 DAUGHTER BIPOLAR LIVES   ETOH  Occupation: RETIRED MACHINIST:   Risk Factors: Alcohol Use: 0 (12/21/2008) Caffeine Use: 4 (12/21/2008) Exercise: yes (12/21/2008)  Risk Factors: Smoking Status: quit (12/21/2008) Packs/Day: 1979 (12/21/2008) Passive Smoke Exposure: no  (12/21/2008)  Review of Systems       As per HPI and past medical history; otherwise all systems negative.   Vital Signs:  Patient profile:   77 year old male Height:      69.50 inches Weight:      164 pounds BMI:     23.96 Pulse rate:   57 / minute BP supine:   110 / 62  (left arm) BP sitting:   110 / 62  (left arm) BP standing:   98 / 58  (left arm) Cuff size:   regular  Vitals Entered By: Bishop Dublin, CMA (December 01, 2009 11:24 AM)   Physical Exam  General:  well appearing. no resp difficulty HEENT: normal Neck: supple. no JVD. Carotids 2+ bilat; soft R bruit. No lymphadenopathy or thryomegaly appreciated. Cor: PMI nondisplaced. Regular rate & rhythm. No rubs, gallops, murmur. Lungs: clear Abdomen: soft, nontender, nondistended. No bruits or masses. Good bowel sounds. Extremities: no cyanosis, clubbing, rash, edema. small mlister R ankle no erythema or cellulitis. Neuro: alert & orientedx3, cranial nerves grossly intact. moves all 4 extremities w/o difficulty. affect pleasant   Impression & Recommendations:  Problem # 1:  HYPOTENSION, UNSPECIFIED (ICD-458.9) BP is a bit below baseline today. My concern was for possible underlying infection but had blood work Monday and reportedly OK. No fevers or chills. Ankle blister does not look infected. Will stop carvedilol and encouraged him to drink fluids. If symptoms persist would f/u with PCP for further evalaution. Told him to watch it very closely.   Problem # 2:  CORONARY ARTERY DISEASE (ICD-414.00) Stable. No evidence of ischemia. Continue current regimen.  Problem # 3:  Carotid Stenosis Stable. ARepeat u/s in 6 months.  Other Orders: EKG w/ Interpretation (93000) Carotid Duplex (Carotid Duplex)  Patient Instructions: 1)  Your physician has recommended you make the following change in your medication: STOP COREG  2)  Your physician wants you to follow-up in:    6 months will receive a reminder letter in the  mail two months in advance. If you don't receive a letter, please call our office to schedule the follow-up appointment. 3)  Your physician has requested that you have a carotid duplex. This test is an ultrasound of the carotid arteries in your neck. It looks at blood flow through these arteries that supply the brain with blood. Allow one hour for this exam. There are no restrictions or special instructions. before your next appointment  4)  Increase your fluid intake, watch for fever and chills.

## 2010-05-03 NOTE — Assessment & Plan Note (Signed)
Summary: 3 M F/U  DLO   Vital Signs:  Patient Profile:   77 Years Old Male Height:     69.50 inches (176.53 cm) Weight:      175 pounds Temp:     98.3 degrees F oral Pulse rate:   68 / minute Pulse rhythm:   regular BP sitting:   110 / 70  (right arm) Cuff size:   regular  Vitals Entered ByMarland Kitchen Providence Crosby (March 18, 2007 9:49 AM)                 Chief Complaint:  F/U .  History of Present Illness: Was recently seen by Dermatology and had multiple places treated, one of which must be done again. Doing well, recovered from his URI of last month and feels good. He haqd blood count done 3 months ago for his physical which showed elevated WBCs and low Plts which I have repeated today. He has no h/o blood problems in the past nor any forms of cancer or infection at the present time.  Current Allergies: No known allergies       Physical Exam  General:     Well-developed,well-nourished,in no acute distress; alert,appropriate and cooperative throughout examination Head:     Normocephalic and atraumatic without obvious abnormalities. No apparent alopecia or balding. Multiple band aids from Derm procedures last few days. Eyes:     Conjunctiva clear bilaterally.  Ears:     External ear exam shows no significant lesions or deformities.  Otoscopic examination reveals clear canals, tympanic membranes are intact bilaterally without bulging, retraction, inflammation or discharge. Hearing is grossly normal bilaterally. Nose:     External nasal examination shows no deformity or inflammation. Nasal mucosa are pink and moist without lesions or exudates. Mouth:     Oral mucosa and oropharynx without lesions or exudates.  Teeth in good repair. Neck:     No deformities, masses, or tenderness noted. Chest Wall:     No deformities, masses, tenderness or gynecomastia noted. Lungs:     Normal respiratory effort, chest expands symmetrically. Lungs are clear to auscultation, no crackles  or wheezes. Heart:     Normal rate and regular rhythm. S1 and S2 normal without gallop, murmur, click, rub or other extra sounds.    Impression & Recommendations:  Problem # 1:  LEUKOCYTOSIS (ICD-288.60) Assessment: New Has gone up from 16+ to 23.7.Marland KitchenMarland KitchenMarland Kitchenplts approx 100+k...Marland KitchenMarland KitchenMarland Kitchenwill refer to Heme/Onc. Orders: Hematology Referral (Hematology)   Problem # 2:  HYPERTENSION (ICD-401.9) Assessment: Unchanged Good control, cont curr tx. His updated medication list for this problem includes:    Lisinopril 20 Mg Tabs (Lisinopril) .[name]... 1 by mouth daily    Carvedilol 6.25 Mg Tabs (Carvedilol) .[name]... 1 bid  BP today: 110/70 Prior BP: 104/60 (02/05/2007)  Labs Reviewed: Creat: 1.4 (12/12/2006) Chol: 84 (12/12/2006)   HDL: 22.5 (12/12/2006)   LDL: 46 (12/12/2006)   TG: 80 (12/12/2006)   Complete Medication List: 1)  Valtrex 1 Gm Tabs (Valacyclovir hcl) .... 1/2 tab by mouth two times a day 2)  Benadryl 25 Mg Caps (Diphenhydramine hcl) .[name].. 1 by mouth at bedtime 3)  Bayer Aspirin 81 Mg Tabs (aspirin)  .[name].. 1 by mouth daily 4)  Lisinopril 20 Mg Tabs (Lisinopril) .[name].. 1 by mouth daily 5)  Fish Oil Oil (Fish oil) .... Two times a day 6)  Zocor 20 Mg Tabs (Simvastatin) .[name].. 1 by mouth at bedtime 7)  Carvedilol 6.25 Mg Tabs (Carvedilol) .[name].. 1 bid 8)  Anusol-hc 25  Mg Supp (Hydrocortisone acetate) .... One per rectum three times a day for one week then as needed   Patient Instructions: 1)  Refer to Hematology 2)  RTC 9/09 for COMP EXAM, labs prior, sooner as needed     ]

## 2010-05-03 NOTE — Assessment & Plan Note (Signed)
Summary: CPX/JRR   Vital Signs:  Patient profile:   77 year old male Weight:      167 pounds Temp:     97.8 degrees F oral Pulse rate:   64 / minute Pulse rhythm:   regular BP sitting:   124 / 78  (left arm) Cuff size:   regular  Vitals Entered By: Sydell Axon LPN (January 04, 2010 10:52 AM) CC: 30 minute checkup, had a colonoscopy 09/06   History of Present Illness: Pt here for Comp Exam. He has no complaints and  feels well. Dr Jones Broom recently stopped his Carvedilol due to low HR and has done well with both BP and pulse "normalizing." Otherwise, nothing else to discuss.  Preventive Screening-Counseling & Management  Alcohol-Tobacco     Alcohol drinks/day: 0     Alcohol type: beer occas, rare wine     Smoking Status: quit     Packs/Day: 1979     Year Quit: 1979     Pack years: 60     Passive Smoke Exposure: no  Caffeine-Diet-Exercise     Caffeine use/day: 2     Does Patient Exercise: yes     Type of exercise: walks 2 miles at [employer] [facility]     Times/week: 5  Problems Prior to Update: 1)  Carotid Stenosis  (ICD-433.10) 2)  Hypotension, Unspecified  (ICD-458.9) 3)  Cellulitis, Ankle, Right  (ICD-682.6) 4)  Tick Bite/removal  (ICD-E906.4) 5)  Acute Bronchitis  (ICD-466.0) 6)  Unspecified Subjective Visual Disturbance  (ICD-368.10) 7)  Lesion, Scalp  (ICD-238.2) 8)  Carotid Bruit, Right  (ICD-785.9) 9)  Leukemia, Lymphocytic, 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 1/2 Tab By Mouth Two Times A Day 2)  Benadryl 25 Mg  Caps (Diphenhydramine Hcl) .[name].. 1 By Mouth At Bedtime 3)  Lisinopril 20 Mg  Tabs (Lisinopril) .[name].. 1 By Mouth Daily 4)  Fish Oil 1000 Mg  Caps (Omega-3 Fatty Acids) .... Take 2 - 3 Every Day 5)  Simvastatin 20 Mg Tabs (Simvastatin) .... One Tab By Mouth At Night 6)  Bayer Low Strength 81 Mg Tbec (Aspirin) .... 2 Daily By Mouth 7)  Cvs Suppository (Adult) 3 Gm Supp (Glycerin (Laxative)) .... One Supp Per Rectum As Needed For Hard Stools. 8)  Levitra 20 Mg Tabs (Vardenafil Hcl) .... One Tab By Mouth One Hour Prior 9)  Septra Ds 800-160 Mg Tabs (Sulfamethoxazole-Trimethoprim) .... 2 By Mouth Two Times A Day X5 Days  Allergies: No Known Drug Allergies  Past History:  Past Medical History: Last updated: 12/13/2009 Coronary artery disease :(S/P CABG X 4 04/2001)    Myoview 2008 EF 63% normal perfusion GERD:(1990'S) Hyperlipidemia:(2002) Hypertension:(2002) CLL and thrombocytopenia- Dr. Welton Flakes  Past Surgical History: Last updated: 12/21/2007 HEP A ([facility], [place]) 1954 HEMMORHOIDECTOMY/ FISSURE REPAIOR ([place]) 1954 KIDNEY STONE REMOVAL (DR BATES) 1977 LITHOTRIPSY MULTIP 1990s ANGIOPLASTY :(1998) CATHERIZATION WITH ANGIOPLASTY:(1998) CABG X4 (DR. GEARHART):(04/23/2001) EGD; GASTRITIS :(  11/29/1987) COLONOSCOPYNORMAL:(11/29/1987) COLONOSCOPY; HEMM. INTERNAL ,FOCAL PROCTITIS ; NEGATIVE BIOPSY:(02/07/2003) COLONOSCOPY; INTERNAL EXTERNAL HEMORRHOIDS ;+PROCTITIS , NEGATIVE BIOPSY:(12/12/2004) ETT/Persantine Myoview nml ( 12/10/2006) Cataracts R 1/09  L 8/09  Family History: Last updated: 12-20-2006 Father: DECEASED 29 YOA +HTN, NATURAL CAUSES BEDRIDDEN X 2 YEARS  Mother: DECEASED 39 YOA "HEART" , + HTN Siblings: 1 BROTHER DECEASED 63YOA ETOH DISEASE --FAILURE                1 BROTHER DECEASED 55 YOA MI, STROKE                 1 BROTHER DECEASED  27YOA DROWNING                 1  1/2 BROTHER SHOT TO DEATH  1 SISTER DECEASED 58 YOA RENAL CELL CANCER CV: + MOTHER, +BROTHER HBP: +MOTHER, + FATHER DM: + SISTER, SISTERS DAUGHTER GOUT/ARTHRITIS: BREAST/OVARIAN/UTERINE CANCER:: + SISTER RENAL CELL CANCER COLON CANCER: DEPRESSION: + DAUGHTER (BIPOLAR) ETOH: +BROTHER OTHER: + STROKE BROTHER  Social History: Last updated: 12/20/06 Marital Status: Married LIVES WITH WIFE Children:  1 SON DIED LUNG CANCER 40 YOA// 1 DAUGHTER BIPOLAR LIVES   ETOH  Occupation: RETIRED MACHINIST:   Risk Factors: Alcohol Use: 0 (01/04/2010) Caffeine Use: 2 (01/04/2010) Exercise: yes (01/04/2010)  Risk Factors: Smoking Status: quit (01/04/2010) Packs/Day: 1979 (01/04/2010) Passive Smoke Exposure: no (01/04/2010)  Social History: Caffeine use/day:  2  Review of Systems General:  Denies chills, fatigue, fever, sweats, weakness, and weight loss. Eyes:  Denies blurring, discharge, and eye pain; has floaters bilat. ENT:  Denies decreased hearing, earache, and ringing in ears. CV:  Complains of swelling of hands; denies chest pain or discomfort, fainting, fatigue, shortness of breath with exertion, and swelling of feet; happened yesterday after painting.[name] Resp:  Denies cough, shortness of breath, and wheezing. GI:  Denies abdominal pain, bloody stools, change in bowel habits, constipation, dark tarry stools, diarrhea, indigestion, loss of appetite, nausea, vomiting, vomiting blood, and yellowish skin color. GU:  Denies discharge, dysuria, incontinence, nocturia, and urinary frequency. MS:  Complains of low back pain and stiffness; denies joint pain, muscle aches, and cramps. Derm:  Denies dryness, itching, and rash. Neuro:  Complains of poor balance; denies numbness, tingling, and tremors; mild latelyu.  Physical Exam  General:  Well-developed,well-nourished,in no acute distress; alert,appropriate and cooperative throughout examination Head:   Normocephalic and atraumatic without obvious abnormalities.  Eyes:  Conjunctiva clear bilaterally.  Ears:  External ear exam shows no significant lesions or deformities.  Otoscopic examination reveals clear canals, tympanic membranes are intact bilaterally without bulging, retraction, inflammation or discharge. Hearing is grossly normal bilaterally. Nose:  mucosal erythema.   Mouth:  Oral mucosa and oropharynx without lesions or exudates.  Teeth in good repair. Neck:  No deformities, masses, or tenderness noted. Chest Wall:  No deformities, masses, tenderness or gynecomastia noted. Breasts:  No masses or gynecomastia noted Lungs:  Normal respiratory effort, chest expands symmetrically. Lungs are clear to auscultation, no crackles or wheezes, improved. Heart:  Normal rate and regular rhythm. S1 and S2 normal without gallop, murmur, click, rub or other extra sounds. Abdomen:  Bowel sounds positive,abdomen soft and non-tender without masses, organomegaly or hernias noted. Rectal:  No external abnormalities noted. Normal sphincter tone. No rectal masses or tenderness. G neg. Genitalia:  Testes bilaterally descended without nodularity, tenderness or masses. No scrotal masses or lesions. No penis lesions or urethral discharge. Prostate:  Prostate gland firm and smooth, no enlargement, nodularity, tenderness, mass, asymmetry or  induration. 10 gms. Msk:  Gait nml. On and off exam table nmlly. Pulses:  R and L carotid,radial,femoral,dorsalis pedis and posterior tibial pulses are full and equal bilaterally Extremities:  No clubbing, cyanosis, edema, or deformity noted with normal full range of motion of all joints.   Neurologic:  No cranial nerve deficits noted. Station and gait are normal. Sensory, motor and coordinative functions appear intact. Skin:  Intact without suspicious lesions or rashes Cervical Nodes:  No lymphadenopathy noted Inguinal Nodes:  No significant adenopathy Psych:  Cognition and  judgment appear intact. Alert and cooperative with normal attention span and concentration. No apparent delusions, illusions, hallucinations   Impression & Recommendations:  Problem # 1:  HYPOTENSION, UNSPECIFIED (ICD-458.9) Assessment Improved Back to nml, cont curr meds.  Problem # 2:  LEUKEMIA, LYMPHOCYTIC, CHRONIC (DR KAHN) (ICD-204.10) Assessment: Unchanged WBC slightly elevated over when seen last month by Onc. Reassured.  Problem # 3:  TICK BITE (ICD-E906.4) Assessment: Improved Resolved and well healed.  Problem # 4:  DERMATITIS, SEBORRHEIC NOS (ICD-690.10) Assessment: Unchanged Stable and well controlled.  Problem # 5:  SCREENING FOR MALIGNANT NEOPLASM, PROSTATE (ICD-V76.44) Assessment: Unchanged PSA stable.  Problem # 6:  GENITAL HERPES (ICD-054.10) Assessment: Unchanged Well controlled.  Problem # 7:  HYPERGLYCEMIA (ICD-790.29) Assessment: Unchanged Stable. Labs Reviewed: Creat: 1.4 (12/28/2009)     Problem # 8:  HYPERTENSION (ICD-401.9) Assessment: Improved Stable, better. His updated medication list for this problem includes:    Lisinopril 20 Mg Tabs (Lisinopril) .[name]... 1 by mouth daily  BP today: 124/78 Prior BP: 98/58 (12/01/2009)  Labs Reviewed: K+: 4.2 (12/28/2009) Creat: : 1.4 (12/28/2009)   Chol: 103 (12/28/2009)   HDL: 35.00 (12/28/2009)   LDL: 50 (12/28/2009)   TG: 91.0 (12/28/2009)  Problem # 9:  HYPERLIPIDEMIA (ICD-272.4) Assessment: Unchanged Stable and very good, well controlled. His updated medication list for this problem includes:    Simvastatin 20 Mg Tabs (Simvastatin) ..... One tab by mouth at night  Labs Reviewed: SGOT: 33 (12/28/2009)   SGPT: 29 (12/28/2009)   HDL:35.00 (12/28/2009), 27.10 (12/09/2008)  LDL:50 (12/28/2009), 51 (12/09/2008)  Chol:103 (12/28/2009), 92 (12/09/2008)  Trig:91.0 (12/28/2009), 70.0 (12/09/2008)  Problem # 10:  CORONARY ARTERY DISEASE (ICD-414.00) Assessment: Unchanged  Stable, well controlled. His  updated medication list for this problem includes:    Lisinopril 20 Mg Tabs (Lisinopril) .[name]... 1 by mouth daily    Bayer Low Strength 81 Mg Tbec (Aspirin) .[name]... 2 daily by mouth  Labs Reviewed: Chol: 103 (12/28/2009)   HDL: 35.00 (12/28/2009)   LDL: 50 (12/28/2009)   TG: 91.0 (12/28/2009)  Complete Medication List: 1)  Valtrex 1 Gm Tabs (Valacyclovir hcl) .... 1/2 tab by mouth two times a day 2)  Benadryl 25 Mg Caps (Diphenhydramine hcl) .[name].. 1 by mouth at bedtime 3)  Lisinopril 20 Mg Tabs (Lisinopril) .[name].. 1 by mouth daily 4)  Fish Oil 1000 Mg Caps (Omega-3 fatty acids) .... Take  3 every day 5)  Simvastatin 20 Mg Tabs (Simvastatin) .... One tab by mouth at night 6)  Bayer Low Strength 81 Mg Tbec (Aspirin) .... 2 daily by mouth 7)  Cvs Suppository (adult) 3 Gm Supp (Glycerin (laxative)) .... One supp per rectum as needed for hard stools. 8)  Levitra 20 Mg Tabs (Vardenafil hcl) .... One tab by mouth one hour prior  Other Orders: Flu Vaccine 59yrs + MEDICARE PATIENTS (N6295) Administration Flu vaccine - MCR (M8413)  Current Allergies (reviewed today): No known allergies   Flu Vaccine Consent Questions  Do you have a history of severe allergic reactions to this vaccine? no    Any prior history of allergic reactions to egg and/or gelatin? no    Do you have a sensitivity to the preservative Thimersol? no    Do you have a past history of Guillan-Barre Syndrome? no    Do you currently have an acute febrile illness? no    Have you ever had a severe reaction to latex? no    Vaccine information given and explained to patient? yes    Are you currently pregnant? no    Lot Number:[id number]   Exp Date:09/29/2010   Site Given  Left Deltoid IMedflu

## 2010-05-23 ENCOUNTER — Ambulatory Visit (INDEPENDENT_AMBULATORY_CARE_PROVIDER_SITE_OTHER): Payer: Medicare Other | Admitting: Family Medicine

## 2010-05-23 ENCOUNTER — Encounter: Payer: Self-pay | Admitting: Family Medicine

## 2010-05-23 DIAGNOSIS — J069 Acute upper respiratory infection, unspecified: Secondary | ICD-10-CM

## 2010-05-29 NOTE — Assessment & Plan Note (Signed)
Summary: COUGH,CONGESTION/CLE   [facility]   Vital Signs:  Patient profile:   77 year old male Weight:      167 pounds O2 Sat:      96 % on Room air Temp:     98.4 degrees F oral Pulse rate:   84 / minute BP sitting:   118 / 70  (left arm) Cuff size:   regular  Vitals Entered By: Sydell Axon LPN (May 23, 2010 10:37 AM)  O2 Flow:  Room air CC: Head congestion and a deep cough   History of Present Illness: Pt here for bad cold since SUn with coughing sneezing and congestion since then with difficulty staying asleep due to cough. He has chill last night, no fever, mild ear pain from blowing the nose, no headache, significant rhinitis that is clear water, some ST that is going away, cough that is minimally productive, minimally yellow. No N/V and no diarrhea.  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 1/2 Tab By Mouth Two Times A Day 2)  Benadryl 25 Mg  Caps (Diphenhydramine Hcl) .[name].. 1 By Mouth At Bedtime 3)  Lisinopril 20 Mg  Tabs (Lisinopril) .[name].. 1 By Mouth Daily 4)  Fish Oil 1000 Mg  Caps (Omega-3 Fatty Acids) .... Take  3 Every Day 5)  Simvastatin 20  Mg Tabs (Simvastatin) .... One Tab By Mouth At Night 6)  Bayer Low Strength 81 Mg Tbec (Aspirin) .... 2 Daily By Mouth 7)  Cvs Suppository (Adult) 3 Gm Supp (Glycerin (Laxative)) .... One Supp Per Rectum As Needed For Hard Stools. 8)  Levitra 20 Mg Tabs (Vardenafil Hcl) .... One Tab By Mouth One Hour Prior  Allergies: No Known Drug Allergies  Physical Exam  General:  Well-developed,well-nourished,in no acute distress; alert,appropriate and cooperative throughout examination, mildly congested. Head:  Normocephalic and atraumatic without obvious abnormalities. Sinuses NT. Eyes:  Conjunctiva clear bilaterally.  Ears:  External ear exam shows no significant lesions or deformities.  Otoscopic examination reveals clear canals, tympanic membranes are intact bilaterally without bulging, retraction, inflammation or discharge. Hearing is grossly normal bilaterally. Nose:  mucosal erythema, no discharge seen..   Mouth:  Oral mucosa and oropharynx without lesions or exudates.  Teeth in good repair. Min thick PND. Neck:  No deformities, masses, or tenderness noted. Lungs:  Normal respiratory effort, chest expands symmetrically. Lungs are clear to auscultation, no crackles or wheezes, improved. Heart:  Normal rate and regular rhythm. S1 and S2 normal without gallop, murmur, click, rub or other extra sounds.   Impression & Recommendations:  Problem # 1:  URI (ICD-465.9) Assessment New  See instructions. Stop time release Guaif and start immediate release. Codeine for cough  at night as needed. His updated medication list for this problem includes:    Benadryl 25 Mg Caps (Diphenhydramine hcl) .[name]... 1 by mouth at bedtime    Bayer Low Strength 81 Mg Tbec (Aspirin) .[name]... 2 daily by mouth    Promethazine-codeine 6.25-10 Mg/12ml Syrp (Promethazine-codeine) ..... One tsp by mouth at night as needed for cough.  Instructed on symptomatic treatment. Call if symptoms persist or worsen.   Complete Medication  List: 1)  Valtrex 1 Gm Tabs (Valacyclovir hcl) .... 1/2 tab by mouth two times a day 2)  Benadryl 25 Mg Caps (Diphenhydramine hcl) .[name].. 1 by mouth at bedtime 3)  Lisinopril 20 Mg Tabs (Lisinopril) .[name].. 1 by mouth daily 4)  Fish Oil 1000 Mg Caps (Omega-3 fatty acids) .... Take  3 every day 5)  Simvastatin 20 Mg Tabs (Simvastatin) .... One tab by mouth at night 6)  Bayer Low Strength 81 Mg Tbec (Aspirin) .... 2 daily by mouth 7)  Cvs Suppository (adult) 3 Gm Supp (Glycerin (laxative)) .... One supp per rectum as needed for hard stools. 8)  Levitra 20 Mg Tabs (Vardenafil hcl) .... One tab by mouth one hour prior 9)  Promethazine-codeine 6.25-10 Mg/95ml Syrp (Promethazine-codeine) .... One tsp by mouth at night as needed for cough.  Patient Instructions: 1)  Take Guaifenesin by [facility] to [facility] or [facility] and getting MUCOUS RELIEF EXPECTORANT (400mg ), take 11/2 tabs by mouth AM and NOON. 2)  Drink lots of fluids anytime taking Guaifenesin.  3)  Take ncodeine at night as needed for cough. 4)  Call if sxs don't improve. Prescriptions: PROMETHAZINE-CODEINE 6.25-10 MG/5ML SYRP (PROMETHAZINE-CODEINE) one tsp by mouth at night as needed for cough.  #8 oz x 0   Entered and Authorized by:   Shaune Leeks MD   Signed by:   Shaune Leeks MD on 05/23/2010   Method used:   Print then Give to Patient   RxID:   [id number]    Orders Added: 1)  Est. Patient Level III [56213]    Current Allergies (reviewed today): No known allergies

## 2010-05-30 ENCOUNTER — Telehealth: Payer: Self-pay | Admitting: Family Medicine

## 2010-05-30 ENCOUNTER — Encounter (INDEPENDENT_AMBULATORY_CARE_PROVIDER_SITE_OTHER): Payer: Self-pay | Admitting: *Deleted

## 2010-05-30 ENCOUNTER — Other Ambulatory Visit (INDEPENDENT_AMBULATORY_CARE_PROVIDER_SITE_OTHER): Payer: Medicare Other

## 2010-05-30 ENCOUNTER — Other Ambulatory Visit: Payer: Self-pay | Admitting: Family Medicine

## 2010-05-30 ENCOUNTER — Encounter: Payer: Self-pay | Admitting: Family Medicine

## 2010-05-30 DIAGNOSIS — E785 Hyperlipidemia, unspecified: Secondary | ICD-10-CM

## 2010-05-30 DIAGNOSIS — R7309 Other abnormal glucose: Secondary | ICD-10-CM

## 2010-05-30 DIAGNOSIS — Z125 Encounter for screening for malignant neoplasm of prostate: Secondary | ICD-10-CM

## 2010-05-30 DIAGNOSIS — Z87442 Personal history of urinary calculi: Secondary | ICD-10-CM

## 2010-05-30 DIAGNOSIS — I2510 Atherosclerotic heart disease of native coronary artery without angina pectoris: Secondary | ICD-10-CM

## 2010-05-30 DIAGNOSIS — C9110 Chronic lymphocytic leukemia of B-cell type not having achieved remission: Secondary | ICD-10-CM

## 2010-05-30 LAB — BASIC METABOLIC PANEL
BUN: 19 mg/dL (ref 6–23)
CO2: 31 mEq/L (ref 19–32)
Calcium: 9.7 mg/dL (ref 8.4–10.5)
Chloride: 106 mEq/L (ref 96–112)
Creatinine, Ser: 1.4 mg/dL (ref 0.4–1.5)
GFR: 51.93 mL/min — ABNORMAL LOW (ref >60.00)
Glucose, Bld: 94 mg/dL (ref 70–99)
Potassium: 4.2 mEq/L (ref 3.5–5.1)
Sodium: 143 mEq/L (ref 135–145)

## 2010-05-30 LAB — CONVERTED CEMR LAB
Basophils Absolute: 0 10*3/uL (ref 0.0–0.1)
Basophils Relative: 0 % (ref 0–1)
Eosinophils Absolute: 1.2 10*3/uL — ABNORMAL HIGH (ref 0.0–0.7)
Eosinophils Relative: 2 % (ref 0–5)
HCT: 39.1 % (ref 39.0–52.0)
Hemoglobin: 13.5 g/dL (ref 13.0–17.0)
Lymphocytes Relative: 87 % — ABNORMAL HIGH (ref 12–46)
Lymphs Abs: 51.9 10*3/uL — ABNORMAL HIGH (ref 0.7–4.0)
MCHC: 34.5 g/dL (ref 30.0–36.0)
MCV: 101.7 fL — ABNORMAL HIGH (ref 78.0–100.0)
Monocytes Absolute: 2.4 10*3/uL — ABNORMAL HIGH (ref 0.1–1.0)
Monocytes Relative: 4 % (ref 3–12)
Neutro Abs: 4.2 10*3/uL (ref 1.7–7.7)
Neutrophils Relative %: 7 % — ABNORMAL LOW (ref 43–77)
Platelets: 133 10*3/uL — ABNORMAL LOW (ref 150–400)
RBC: 3.85 M/uL — ABNORMAL LOW (ref 4.22–5.81)
RDW: 14.3 % (ref 11.5–15.5)
WBC: 59.7 10*3/uL — ABNORMAL HIGH (ref 4.0–10.5)

## 2010-05-30 LAB — TSH: TSH: 4.79 u[IU]/mL (ref 0.35–5.50)

## 2010-05-30 LAB — LIPID PANEL
Cholesterol: 94 mg/dL (ref 0–200)
HDL: 28.3 mg/dL — ABNORMAL LOW (ref >39.00)
LDL Cholesterol: 47 mg/dL (ref 0–99)
Total CHOL/HDL Ratio: 3
Triglycerides: 96 mg/dL (ref 0.0–149.0)
VLDL: 19.2 mg/dL (ref 0.0–40.0)

## 2010-05-30 LAB — MICROALBUMIN / CREATININE URINE RATIO
Creatinine,U: 200.4 mg/dL
Microalb Creat Ratio: 0.5 mg/g (ref 0.0–30.0)
Microalb, Ur: 1 mg/dL (ref 0.0–1.9)

## 2010-05-30 LAB — PSA: PSA: 0.42 ng/mL (ref 0.10–4.00)

## 2010-05-30 LAB — HEPATIC FUNCTION PANEL
ALT: 23 U/L (ref 0–53)
AST: 29 U/L (ref 0–37)
Albumin: 4.1 g/dL (ref 3.5–5.2)
Alkaline Phosphatase: 126 U/L — ABNORMAL HIGH (ref 39–117)
Bilirubin, Direct: 0.1 mg/dL (ref 0.0–0.3)
Total Bilirubin: 0.8 mg/dL (ref 0.3–1.2)
Total Protein: 7.1 g/dL (ref 6.0–8.3)

## 2010-06-01 LAB — CONVERTED CEMR LAB
Calcium, Total (PTH): 9.3 mg/dL (ref 8.4–10.5)
PTH: 25.8 pg/mL (ref 14.0–72.0)
Vit D, 25-Hydroxy: 24 ng/mL — ABNORMAL LOW (ref 30–89)

## 2010-06-06 ENCOUNTER — Encounter: Payer: Self-pay | Admitting: Family Medicine

## 2010-06-06 ENCOUNTER — Ambulatory Visit (INDEPENDENT_AMBULATORY_CARE_PROVIDER_SITE_OTHER): Payer: Medicare Other | Admitting: Family Medicine

## 2010-06-06 DIAGNOSIS — I10 Essential (primary) hypertension: Secondary | ICD-10-CM

## 2010-06-06 DIAGNOSIS — I959 Hypotension, unspecified: Secondary | ICD-10-CM

## 2010-06-06 DIAGNOSIS — L219 Seborrheic dermatitis, unspecified: Secondary | ICD-10-CM

## 2010-06-06 DIAGNOSIS — C9110 Chronic lymphocytic leukemia of B-cell type not having achieved remission: Secondary | ICD-10-CM

## 2010-06-07 NOTE — Progress Notes (Signed)
Summary: CRITICAL LAB WCT 60.2  Phone Note From Other Clinic   Caller: [facility] Call For: Jimmy [name] Summary of Call: Critical labs white count 60.2, immature cells noted, sending to [facility] for cbcd, and if needed path review. Initial call taken by: Liane Comber,  May 30, 2010 2:15 PM  Follow-up for Phone Call        Pt has known CLL. May need to be seen again by Hematology. Follow-up by: Shaune Leeks MD,  May 30, 2010 4:14 PM

## 2010-06-12 NOTE — Assessment & Plan Note (Signed)
Summary: 6 MONTH FU/DLO   Vital Signs:  Patient profile:   77 year old male Weight:      167.75 pounds Temp:     97.4 degrees F oral Pulse rate:   68 / minute Pulse rhythm:   regular BP sitting:   116 / 64  (left arm) Cuff size:   regular  Vitals Entered By: Sydell Axon LPN (June 05, 452 9:09 AM) CC: 6 month follow-up   History of Present Illness: Pt here for 6 month followup. His cold is better.  He gets insect bites that do not heal. Hae has had CLL for years. He feels well and has no complaints. He has appt with Dr Park Breed Hem/Onc in June. He also has appt with Dr Jones Broom in June in [place].  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 1/2 Tab By Mouth Two Times A Day 2)  Benadryl 25 Mg  Caps (Diphenhydramine Hcl) .[name].. 1 By Mouth At Bedtime 3)  Lisinopril 20 Mg  Tabs (Lisinopril) .[name].. 1 By Mouth Daily 4)  Fish Oil 1000 Mg  Caps (Omega-3 Fatty Acids) .... Take  3 Every Day 5)  Simvastatin 20 Mg Tabs (Simvastatin) .... One Tab By Mouth At Night 6)  Bayer Low Strength 81 Mg Tbec (Aspirin) .... 2 Daily By Mouth 7)  Cvs Suppository (Adult) 3 Gm Supp (Glycerin (Laxative)) .... One Supp Per  Rectum As Needed For Hard Stools. 8)  Levitra 20 Mg Tabs (Vardenafil Hcl) .... One Tab By Mouth One Hour Prior 9)  Promethazine-Codeine 6.25-10 Mg/53ml Syrp (Promethazine-Codeine) .... One Tsp By Mouth At Night As Needed For Cough.  Current Medications (verified): 1)  Valtrex 1 Gm Tabs (Valacyclovir Hcl) .... 1/2 Tab By Mouth Two Times A Day 2)  Benadryl 25 Mg  Caps (Diphenhydramine Hcl) .[name].. 1 By Mouth At Bedtime 3)  Lisinopril 20 Mg  Tabs (Lisinopril) .[name].. 1 By Mouth Daily 4)  Fish Oil 1000 Mg  Caps (Omega-3 Fatty Acids) .... Take  3 Every Day 5)  Simvastatin 20 Mg Tabs (Simvastatin) .... One Tab By Mouth At Night 6)  Bayer Low Strength 81 Mg Tbec (Aspirin) .... 2 Daily By Mouth 7)  Cvs Suppository (Adult) 3 Gm Supp (Glycerin (Laxative)) .... One Supp Per Rectum As Needed For Hard Stools. 8)  Levitra 20 Mg Tabs (Vardenafil Hcl) .... One Tab By Mouth One Hour Prior 9)  Promethazine-Codeine 6.25-10 Mg/39ml Syrp (Promethazine-Codeine) .... One Tsp By Mouth At Night As Needed For Cough.  Allergies: No Known Drug Allergies  Physical Exam  General:  Well-developed,well-nourished,in no acute distress; alert,appropriate and cooperative throughout examination, mildly congested. Head:  Normocephalic and atraumatic without obvious abnormalities. Sinuses NT. Eyes:  Conjunctiva clear bilaterally.  Ears:  External ear exam shows no significant lesions or deformities.  Otoscopic examination reveals clear canals, tympanic membranes are intact bilaterally without bulging, retraction, inflammation or discharge. Hearing is grossly normal bilaterally. Nose:  mucosal erythema, no discharge seen..   Mouth:  Oral mucosa and oropharynx without lesions or exudates.  Teeth in good repair. Min thick PND. Neck:  No deformities, masses, or tenderness noted. Lungs:  Normal respiratory effort, chest expands symmetrically. Lungs are clear to auscultation, no crackles or wheezes, improved. Heart:  Normal rate and regular  rhythm. S1 and S2 normal without gallop, murmur, click, rub or other extra sounds.   Impression & Recommendations:  Problem # 1:  LEUKEMIA, LYMPHOCYTIC, CHRONIC (DR KAHN) (ICD-204.10) Assessment Deteriorated Per lab today, WBC up slighlty over his baseline. Keep appt with Dr Park Breed in Jun as scheduled.  Problem # 2:  HYPOTENSION, UNSPECIFIED (ICD-458.9) Assessment: Improved Resolved. Has been nml two visits in a row.  Problem # 3:  DERMATITIS, SEBORRHEIC NOS (ICD-690.10) Assessment: Unchanged Lesion he is concerned about on his lower right leg is benign, would do nothing about it. Discussed hyperpigmentation of healing lesions in general.  Problem # 4:  HYPERTENSION (ICD-401.9) Assessment: Unchanged  Stable.  His updated medication list for this problem includes:    Lisinopril 20 Mg Tabs (Lisinopril) .[name]... 1 by mouth daily  BP today: 116/64 Prior BP: 118/70 (05/23/2010)  Labs Reviewed: K+: 4.2 (05/30/2010) Creat: : 1.4 (05/30/2010)   Chol: 94 (05/30/2010)   HDL: 28.30 (05/30/2010)   LDL: 47 (05/30/2010)   TG: 96.0 (05/30/2010)  Complete Medication List: 1)  Valtrex 1 Gm Tabs (Valacyclovir hcl) .... 1/2 tab by mouth two times a day 2)  Benadryl 25 Mg Caps (Diphenhydramine hcl) .[name].. 1 by mouth at bedtime 3)  Lisinopril 20 Mg Tabs (Lisinopril) .[name].. 1 by mouth daily 4)  Fish Oil 1000 Mg Caps (Omega-3 fatty acids) .... Take  3 every day 5)  Simvastatin 20 Mg Tabs (Simvastatin) .... One tab by mouth at night 6)  Bayer Low Strength 81 Mg Tbec (Aspirin) .... 2 daily by mouth 7)  Cvs Suppository (adult) 3 Gm Supp (Glycerin (laxative)) .... One supp per rectum as needed for hard stools. 8)  Levitra 20 Mg Tabs (Vardenafil hcl) .... One tab by mouth one hour prior 9)  Promethazine-codeine 6.25-10 Mg/79ml Syrp (Promethazine-codeine) .... One tsp by mouth at night as needed for cough.  Patient Instructions: 1)  RTC 6 mos for Comp Exam, labs prior. 2)  Has appt to see DrKahn in June. This  is probably adequate. Keep that appt.  Prescriptions: VALTREX 1 GM TABS (VALACYCLOVIR HCL) 1/2 tab by mouth two times a day  #90 x 3   Entered by:   Sydell Axon LPN   Authorized by:   Shaune Leeks MD   Signed by:   Sydell Axon LPN on 02/72/5366   Method used:   Electronically to        [facility]* (mail-order)       [address]       [place], [place]  [address]       Ph: [phone]       Fax: [phone]   RxID:   [id number]    Orders Added: 1)  Est. Patient Level III [23557]    Current Allergies (reviewed today): No known allergies

## 2010-08-03 ENCOUNTER — Emergency Department: Admitting: Emergency Medicine

## 2010-08-14 NOTE — Assessment & Plan Note (Signed)
[facility] HEALTHCARE                            CARDIOLOGY OFFICE NOTE   BELTON, PEPLINSKI                     MRN:          [id number]  DATE:01/18/2008                            DOB:          [birth year]    PRIMARY CARE PHYSICIAN:  Arta Silence, MD.   INTERVAL HISTORY:  Jimmy [name] is a delightful 77 year old male with a  history of coronary artery disease, status post bypass surgery in 2003.  He had a Myoview in September 2008, which showed ejection fraction of  63% with no ischemia or infarct.  He also has a history of  hyperlipidemia, hypertension, and minimal bilateral renal artery  stenosis.   He returns today for routine followup.  He is doing well.  He is walking  about 2 miles 6 days a week without any chest pain or shortness of  breath.  He recently had some cataract surgery and was told he had a  murmur and we were asked to evaluate.  He has not had any heart failure  symptoms.  No palpitations.  No syncope or presyncope.   CURRENT MEDICATIONS:  1. Valtrex 1 g a day.  2. Simvastatin 20 at night.  3. Lisinopril 20 a day.  4. Benadryl 25 nightly.  5. Aspirin 81 a day.  6. Fish oil 1000 t.i.d.  7. Coreg 6.25 b.i.d.   REVIEW OF SYSTEMS:  Negative except for as per HPI and problem list.   PHYSICAL EXAMINATION:  GENERAL:  He looks more than his stated age.  VITAL SIGNS:  Blood pressure is 118/64, heart rate is 53, weight is 169,  and ambulates around the clinic without any respiratory difficulty.  HEENT:  Normal.  NECK:  Supple.  No JVD.  Carotids 2+ bilateral without any bruits.  There is no lymphadenopathy or thyromegaly.  CARDIAC:  PMI is nondisplaced.  He is bradycardic and regular with a  very soft systolic ejection murmur at the right sternal border.  S2 is  preserved.  There is no rub or gallop.  LUNGS:  Clear.  ABDOMEN:  Soft, nontender, and nondistended.  No hepatosplenomegaly, no  bruits, and no masses.  Good bowel sounds.   EXTREMITIES:  Warm with no  cyanosis, clubbing, or edema.  Good distal pulses.  NEUROLOGIC:  He is alert and oriented x3.  Cranial nerves II through XII  intact.  Moves all 4 extremities without difficulty.  Affect is  pleasant.  EKG shows sinus bradycardia at a rate of 53.  No ST-T wave  abnormalities.   ASSESSMENT AND PLAN:  1. Coronary artery disease is stable.  He has good exercise tolerance.      His stress test is reassuring.  Continue current therapy.  2. Hypertension.  Blood pressure is well controlled.  3. Murmur.  He does have a murmur of aortic valve sclerosis versus      mild stenosis.  We will get an echocardiogram to confirm.  4. Renal artery stenosis, this is mild.  We will followup every few      years with ultrasound.  5. Hyperlipidemia.  Goal LDL is less  than 70.  This is followed by Dr.      Hetty Ely.   DISPOSITION:  We will see him back in 1 year for a followup.   Also, the patient has been complaining of erectile dysfunction.  We had  given a prescription for Viagra and made it clear to him that he is not  to take this with any nitrates.     Bevelyn Buckles. Bensimhon, MD  Electronically Signed    DRB/MedQ  DD: 01/18/2008  DT: 01/19/2008  Job #: [id number]   cc:   Arta Silence, MD

## 2010-08-14 NOTE — Assessment & Plan Note (Signed)
[facility]ALTHCARE                                 ON-CALL NOTE   FATE, CASTER                       MRN:          [id number]  DATE:05/21/2007                            DOB:          1928-12-13    TIME:  7:25 a.m.   PHONE NUMBER:  [phone].  Patient of Dr. Hetty Ely.   PROGRESS NOTE:  Date of birth was not given on the beeper, nor was  regular physician. The patient fell out of bed this morning at 2:30. He  said he hit his forehead on a table. He has gash over one eye, which is  not more than an inch long but quite deep adn has been bleeding ever  since 2:30 this morning. He wanted to know what to do. I told him to go  on to the emergency department and have it checked out now, since it is  still bleeding and to apply pressure and keep it clean until then.     Marne A. Tower, MD  Electronically Signed    MAT/MedQ  DD: 05/21/2007  DT: 05/22/2007  Job #: [id number]

## 2010-08-14 NOTE — Assessment & Plan Note (Signed)
[facility] HEALTHCARE                            CARDIOLOGY OFFICE NOTE   ERON, STAAT                     MRN:          [id number]  DATE:12/03/2006                            DOB:          06/20/1928    PRIMARY CARE PHYSICIAN:  Arta Silence, MD.   INTERVAL HISTORY:  [name] is a very pleasant, 77 year old male with  a history of coronary artery disease status post bypass surgery in 2003.  He also has a history of hyperlipidemia and hypertension with 1-59%  bilateral renal artery stenosis by recent renal ultrasound. Also mild  renal insufficiency. He was previously followed by Dr. Samule Ohm and  returns today for followup. He has not been seen in a year.   Overall he says he is doing fairly well. He continues to walk 2 miles  every day; however, he noticed over the last month he has been more  short of breath than usual. Previously before his bypass surgery, his  anginal equivalent was fairly severe dyspnea. He denies any chest pain.  No orthopnea, no PND or lower extremity edema.   CURRENT MEDICATIONS:  1. Valtrex 1 gram daily.  2. Simvastatin 20 a day.  3. Lisinopril 20 a day.  4. Atenolol 25 b.i.d.  5. Benadryl at night.  6. Aspirin 81.  7. Fish oil 1000 mg 3 times a day.   PHYSICAL EXAMINATION:  GENERAL:  He is well-appearing in no acute  distress. He ambulates around the clinic without any respiratory  difficulty.  VITAL SIGNS:  Blood pressure is 140/74 with a heart rate of 58.  HEENT:  Normal except for a sore he has on the left side of his forehead  from running into a cabinet.  NECK:  Supple, no JVD. Carotids are 2+ bilaterally without any bruits.  There is no lymphadenopathy or thyromegaly.  CARDIAC:  PMI is nondisplaced. He has a regular rate and rhythm with no  murmurs, rubs or gallops.  LUNGS:  Clear.  ABDOMEN:  Soft, nontender, nondistended, no hepatosplenomegaly, no  bruits, no masses, good bowel sounds.  EXTREMITIES:   Warm with no cyanosis, clubbing or edema. Good distal  pulses.  NEUROLOGIC:  He is alert and oriented x3. Cranial nerves II-XII are  intact. He moves all 4 extremities without difficulty. He has a normal  affect.   EKG shows sinus bradycardia at a rate of 55, no ST-T wave changes.   ASSESSMENT/PLAN:  1. Exertional dyspnea. This is concerning for recurrent angina. We      will go ahead and get a treadmill Myoview to evaluate for any      underlying ischemia. I would have a low threshold for repeat      cardiac catheterization.  2. Hypertension. Blood pressure is suboptimally controlled. We will      switch him off his atenolol and onto Coreg 6.25 b.i.d.  3. Hyperlipidemia. This is followed by Dr. Hetty Ely. Goal LDL is less      than 70. Can titrate simvastatin as needed.  4. Renal artery stenosis. This is mild. He does have some renal  insufficiency. I have asked him to recheck his creatinine with Dr.      Hetty Ely to see what his baseline is. May consider having him be      seen by nephrology.   DISPOSITION:  Pending the results of his Myoview.     Bevelyn Buckles. Bensimhon, MD  Electronically Signed    DRB/MedQ  DD: 12/03/2006  DT: 12/04/2006  Job #: [id number]

## 2010-08-17 NOTE — Op Note (Signed)
[place]. [facility]  Patient:    Jimmy [name], Jimmy [name]                    MRN: [id number] Proc. Date: 06/12/00 Attending:  Janet Berlin. Dan Humphreys, M.D.                           Operative Report  PREOPERATIVE DIAGNOSIS:  Bowens disease, concha of left ear.  POSTOPERATIVE DIAGNOSIS:  Bowens disease, concha of left ear.  OPERATION PERFORMED:  Excisional biopsy of Bowens disease involving the concha of the left ear.  SURGEON:  Janet Berlin. Dan Humphreys, M.D.  ANESTHESIA:  Local.  INDICATIONS FOR PROCEDURE:  The patient is a 77 year old man who was taken to the operating room today for excision of a biopsy proven area of Bowens disease involving the concha of the left ear.  This presents as a small nodule at the insertion of the helix into the concha.  DESCRIPTION OF PROCEDURE:  The patient was brought to the operating room and placed on the table in the supine position with the head turned to the right. The left ear was sterilely prepped and draped.  A sterile cotton ball was placed in the patients external auditory canal to prevent drainage during the procedure.  I used 1% Xylocaine with epinephrine to achieve local anesthesia. The lesion was then excised full thickness in an elliptical fashion.  The specimen was passed off the operative field for examination by the pathologist.  I closed the resulting defect with sutures of 6-0 nylon in horizontal mattress fashion.  The wound was dressed with bacitracin ointment and the patient given wound care instructions.  He will return to the office early next week for a wound check, suture removal, and review of his final pathology report. DD:  06/12/00 TD:  06/12/00 Job: [id number]

## 2010-08-17 NOTE — Cardiovascular Report (Signed)
[place]. [facility]  Patient:    Jimmy [name], Jimmy [name] Visit Number: [id number] MRN: [id number]          Service Type: CAT Location: [facility] 2860 01 Attending Physician:  Daisey Must Dictated by:   Daisey Must, M.D. [facility] Proc. Date: 04/14/01 Admit Date:  04/14/2001 Discharge Date: 04/14/2001   CC:         Laurita Quint, M.D. [facility]  Cardiac Catheterization Laboratory   Cardiac Catheterization  PROCEDURES PERFORMED:  Left heart catheterization with coronary angiography and left ventriculography.  INDICATIONS:  Mr. Odea is a 77 year old male with a history of coronary artery disease.  Previous cardiac catheterization done in 1998, showed a total occlusion of the mid right coronary artery.  An attempt at percutaneous intervention of this right coronary artery was unsuccessful.  At that time, he was described as having no significant disease in the left coronary system. He has recently developed progressive exertional angina and it was opted to proceed with cardiac catheterization to reevaluate his coronary anatomy.  PROCEDURAL NOTE:  A 6-French sheath was placed in the right femoral artery. Coronary angiography was performed with standard Judkins 6-French catheters. Left ventriculography was performed with an angled pigtail catheter.  Contrast was Omnipaque.  There were no complications.  RESULTS:  HEMODYNAMICS:  Left ventricular pressure 132/20.  Aortic pressure 140/76. There is no aortic valve gradient.  LEFT VENTRICULOGRAM:  Wall motion is normal.  Ejection fraction is calculated at 70%.  There is no mitral regurgitation.  CORONARY ARTERIOGRAPHY:  (Right dominant, but right coronary artery is small.)  Left main is normal.  Left anterior descending artery has a diffuse 60-70% stenosis extending from the proximal into the mid vessel.  The distal LAD has a 30% stenosis.  The LAD gives rise to a small first diagonal branch, which has a 90%  stenosis at its origin and a normal sized second diagonal branch.  There is also a ramus intermedius arising from the left main, which is small to normal in size.  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  This is a very large vessel supplying a large bifurcating OM-1 and a normal sized OM-2. In the proximal circumflex is a 40%, followed by a long 60% stenosis.  In the mid circumflex across the origin of a large first marginal branch is an 80% stenosis.  The first marginal branch itself has an 80% stenosis proximally and a 75% stenosis in the mid vessel.  Right coronary artery has a diffuse 40% stenosis proximally, followed by a long 100% occlusion of the mid vessel.  There are extensive bridging right-to-right collaterals filling the distal right coronary artery which is relatively small.  The distal right coronary artery gives rise to a small posterior descending artery and a small posterolateral branch.  IMPRESSIONS: 1. Normal left ventricular systolic function. 2. Three-vessel coronary artery disease, as described, characterized by a    chronic total occlusion of the mid right coronary artery.  Several areas    of moderately severe disease in the left circumflex and a diffuse area of    moderate disease in the left anterior descending artery.  PLAN:  Options at this point include coronary artery bypass surgery versus medical therapy.  Based on the chronic nature of the occlusion of the right coronary artery and diffuse nature of disease in the circumflex, he does not appear to be an ideal candidate for percutaneous coronary intervention.  At this point, the patient would like time to  talk with his family before deciding on a final course of action.  If it is opted to treat the patient medically, would proceed with a stress Cardiolite to evaluate his ischemic burden.  If the patient is opted to proceed with surgery, he will be referred to a cardiothoracic  surgeon. Dictated by:   Daisey Must, M.D. [facility] Attending Physician:  Daisey Must DD:  04/14/01 TD:  04/14/01 Job: [id number]

## 2010-08-17 NOTE — Op Note (Signed)
[place]. [facility]  Patient:    Jimmy [name], Jimmy [name] Visit Number: [id number] MRN: [id number]          Service Type: SUR Location: 2000 2028 01 Attending Physician:  Waldo Laine Dictated by:   Gwenith Daily Tyrone Sage, M.D. Proc. Date: 04/21/01 Admit Date:  04/21/2001   CC:         Daisey Must, M.D. [facility]   Operative Report  PREOPERATIVE DIAGNOSIS:  Progressive angina.  POSTOPERATIVE DIAGNOSIS:  Progressive angina.  PROCEDURE:  Coronary artery bypass grafting x 4 with the left internal mammary artery to the left anterior descending coronary artery, reversed saphenous vein graft to the first obtuse marginal, sequential reversed saphenous vein graft to the distal right coronary artery and distal circumflex coronary artery.  SURGEON:  Gwenith Daily. Tyrone Sage, M.D.  FIRST ASSISTANT:  Dominica Severin, P.A.  BRIEF HISTORY:  The patient is a 77 year old male with known coronary occlusive disease, having had attempted angioplasty on a totally occluded right coronary artery several years ago.  He was treated medically but because of over the past several months increasing episodes of chest pain which were becoming more frequent and more severe in spite of medical therapy, he was referred to Dr. Gerri Spore, who carried out cardiac catheterization.  At the time of catheterization he had preserved LV function and was found to have a totally occluded right coronary artery, which was relatively small.  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  In the long segment of this running between the aorta and the pulmonary artery, there was a long 70-80% stenosis.  A large first obtuse marginal bifurcated from the vessel on the continuation of the distal circumflex.  At this junction was an 80-90% stenosis involving the bifurcation.  In addition, the patient had calcification of the proximal LAd with a long approximately 60% stenosis. Because of  his three-vessel disease and the difficulty approaching the circumflex with angioplasty, coronary artery bypass grafting was recommended to the patient, who agreed and signed informed consent.  DESCRIPTION OF PROCEDURE:  With Swan-Ganz and arterial line monitors in place, the patient underwent general endotracheal anesthesia without incident.  Skin of the chest and leg was prepped with Betadine and draped in the usual sterile manner.  A segment of vein was harvested on the right lower extremity and was of excellent quality and caliber.  A median sternotomy was performed.  The left internal mammary artery was dissected down as a pedicle graft.  The distal artery was divided and had good, free flow.  The pericardium was opened.  Overall ventricular function appeared preserved.  The patient was systemically heparinized, the ascending aorta and the right atrium were cannulated and the aortic root vent cardioplegia needle was introduced into the ascending aorta.  The patient was placed on cardiopulmonary bypass, 2.4 L/min. per sq. m.  Sites of anastomosis were selected and dissected out of the epicardium.  The patients body temperature was cooled to 30 degrees, aortic crossclamp was applied, and 500 cc of cold blood potassium cardioplegia was administered with rapid diastolic arrest of the heart.  The myocardial septal temperature was plotted throughout the crossclamp period.  Attention was turned first to the large first obtuse marginal coronary artery, which was opened and admitted a 1.5 mm probe without difficulty.  The vessel itself was approximately 1.8 mm in size.  Using a running 7-0 Prolene, a distal anastomosis was performed.  Attention was then turned to the distal right coronary  artery, which was a small vessel but opened and admitted a 1.5 mm probe.  Using a longitudinal side-to-side anastomosis, was carried out with a segment of reversed saphenous vein graft.  The distal extent of  the same vein was then carried a short distance to the distal circumflex branch, which was 1.4-1.5 mm in size.  Using a running 7-0 Prolene, distal anastomosis was performed.  Attention was then turned to the mid-left anterior descending coronary artery, which was opened in the midportion.  Using a running 8-0 Prolene, the left internal mammary artery was anastomosed to the left anterior descending coronary artery.  With release of the Edwards bulldog on the mammary artery, there was appropriate rise in myocardial septal temperature. Aortic crossclamp was removed.  Total crossclamp time of 50 minutes.  the patient spontaneously converted to a sinus rhythm.  A partial occlusion clamp was placed on the ascending aorta.  Two punch aortotomies were performed, and each of the two vein grafts were anastomosed to the ascending aorta.  Air was evacuated from the grafts, and the partial occlusion clamp was removed.  Sites of anastomosis were inspected and were free of bleeding.  The patient was then ventilated and weaned from cardiopulmonary bypass without difficulty, remained hemodynamically stable.  He was decannulated in the usual fashion.  Protamine sulfate was administered with the operative field hemostatic.  Two atrial and two ventricular pacing wires were applied, graft markers were applied.  A left pleural tube and two mediastinal tubes were left in place.  The sternum was closed with #6 stainless steel wire, fascia closed with interrupted 0 Vicryl, running 3-0 Vicryl in the subcutaneous tissue, 4-0 subcuticular stitch in the skin edges.  Dry dressings were applied.  Sponge and needle count was reported as correct at completion of the procedure.  The patient did not require any blood bank blood products during the procedure.  He was then transferred to surgical intensive care unit for further postoperative care. Dictated by:   Gwenith Daily Tyrone Sage, M.D. Attending Physician:  Waldo Laine DD:  04/22/01 TD:  04/23/01 Job: [id number]
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
## 2010-08-17 NOTE — Discharge Summary (Signed)
[place]. [facility]  Patient:    Jimmy [name], Jimmy [name] Visit Number: [id number] MRN: [id number]          Service Type: SUR Location: 2000 2028 [id number] Attending Physician:  Waldo Laine Dictated by:   Maxwell Marion, R.N.F.A. Admit Date:  04/21/2001 Discharge Date: 04/25/2001   CC:         Laurita Quint, M.D. [facility]  Daisey Must, M.D. [facility]   Discharge Summary  DATE OF BIRTH:  05/12/1928  ADMISSION DIAGNOSIS:  Coronary artery disease with progressive angina.  PAST MEDICAL HISTORY: 1. Coronary artery disease, status post cardiac catheterization in 1999. 2. Hypertension. 3. Elevated triglycerides. 4. History of kidney stones.  ALLERGIES:  No known drug allergies.  DISCHARGE DIAGNOSIS:  Three-vessel coronary artery disease, status post coronary artery bypass grafting.  HISTORY OF PRESENT ILLNESS:  [name] is a 77 year old Caucasian man with a history of coronary artery disease, status post attempted PTCA in 1999.  He has been treated medically since that time.  He has been doing well until the past about six months when he noted episodes of chest pain have been increasing in frequency and intensity.  He was referred to Dr. Gerri Spore for evaluation, who recommended cardiac catheterization.  This was performed on April 14, 2001.  The results showed three-vessel coronary artery disease with preserved left ventricular function.  Because of his cardiac anatomy, Dr. Gerri Spore felt that bypass grafting would be the preferred treatment for this gentleman.  He was seen and evaluated by Dr. Sheliah Plane in his office on April 16, 2001.  After examination of the patient and review of the records including cardiac catheterization films, he agreed that coronary artery bypass grafting was the preferred treatment choice for this gentleman.  The risks and benefits of the procedure were discussed with the patient and his wife.  All questions  were answered, and they agreed to proceed.  HOSPITAL COURSE:  On April 21, 2001, Jimmy [name] was electively admitted to [facility] under the care of Dr. Sheliah Plane.  He underwent an uncomplicated coronary artery bypass grafting x4.  Grafts placed at the time of the procedure were left internal mammary artery to the left anterior descending artery, saphenous vein was grafted in a sequential fashion to the distal right coronary artery and the distal circumflex artery, saphenous vein was grafted to the first obtuse marginal.  Vein was harvested from the right lower extremity for the bypass graft.  Mr. Fiorito tolerated the procedure well and was transferred in stable condition to the SICU.  Mr. Grudzien postoperative course has been uneventful.  He has remained hemodynamically stable.  He is progressing very well and recovering from his surgery.  It is anticipated he will be ready for discharge home tomorrow, April 25, 2001.  CONDITION ON DISCHARGE:  Improved.  DISCHARGE INSTRUCTIONS:  Include medications, activity, diet, wound care, and follow-up appointments.  Please see discharge instruction sheet for details.  DISCHARGE MEDICATIONS: 1. Enteric-coated aspirin 325 mg p.o. q.d. 2. Darvocet-N 100 1-2 p.o. q.4-6h. p.r.n. for pain. 3. Lopressor 25 mg p.o. q.12h. 4. He has also been instructed to resume the following home medications:    a. Cardizem CD 180 mg p.o. q.d.    b. Lipitor 10 mg p.o. q.d.    c. Benadryl p.r.n.    d. Valtrex p.r.n.  FOLLOW-UP: 1. He has an appointment at Dr. Wanita Chamberlain office on May 08, 2001, at    9 a.m. in the morning,  and he will have a chest x-ray that day. 2. He also was informed to see Dr. Tyrone Sage at the [facility] office on Thursday,    May 14, 2001, at 10 a.m. Dictated by:   Maxwell Marion, R.N.F.A. Attending Physician:  Waldo Laine DD:  04/24/01 TD:  04/26/01 Job: [id number]

## 2010-09-05 ENCOUNTER — Encounter (HOSPITAL_BASED_OUTPATIENT_CLINIC_OR_DEPARTMENT_OTHER): Payer: Medicare Other | Admitting: Oncology

## 2010-09-05 ENCOUNTER — Other Ambulatory Visit: Payer: Self-pay | Admitting: Oncology

## 2010-09-05 DIAGNOSIS — D649 Anemia, unspecified: Secondary | ICD-10-CM

## 2010-09-05 DIAGNOSIS — D696 Thrombocytopenia, unspecified: Secondary | ICD-10-CM

## 2010-09-05 DIAGNOSIS — C9110 Chronic lymphocytic leukemia of B-cell type not having achieved remission: Secondary | ICD-10-CM

## 2010-09-05 DIAGNOSIS — L089 Local infection of the skin and subcutaneous tissue, unspecified: Secondary | ICD-10-CM

## 2010-09-05 LAB — CBC WITH DIFFERENTIAL/PLATELET
HCT: 36.8 % — ABNORMAL LOW (ref 38.4–49.9)
HGB: 12.2 g/dL — ABNORMAL LOW (ref 13.0–17.1)
MCH: 34.3 pg — ABNORMAL HIGH (ref 27.2–33.4)
MCHC: 33.2 g/dL (ref 32.0–36.0)
MCV: 103.3 fL — ABNORMAL HIGH (ref 79.3–98.0)
Platelets: 87 10*3/uL — ABNORMAL LOW (ref 140–400)
RBC: 3.56 10*6/uL — ABNORMAL LOW (ref 4.20–5.82)
RDW: 14.6 % (ref 11.0–14.6)
WBC: 46.2 10*3/uL — ABNORMAL HIGH (ref 4.0–10.3)

## 2010-09-05 LAB — MANUAL DIFFERENTIAL
ALC: 43 10*3/uL — ABNORMAL HIGH (ref 0.9–3.3)
ANC (CHCC manual diff): 2.8 10*3/uL (ref 1.5–6.5)
Band Neutrophils: 0 % (ref 0–10)
Basophil: 0 % (ref 0–2)
Blasts: 0 % (ref 0–0)
EOS: 1 % (ref 0–7)
LYMPH: 93 % — ABNORMAL HIGH (ref 14–49)
MONO: 0 % (ref 0–14)
Metamyelocytes: 0 % (ref 0–0)
Myelocytes: 0 % (ref 0–0)
Other Cell: 0 % (ref 0–0)
PLT EST: DECREASED
PROMYELO: 0 % (ref 0–0)
SEG: 6 % — ABNORMAL LOW (ref 38–77)
Variant Lymph: 0 % (ref 0–0)
nRBC: 0 % (ref 0–0)

## 2010-09-06 LAB — COMPREHENSIVE METABOLIC PANEL
ALT: 19 U/L (ref 0–53)
AST: 26 U/L (ref 0–37)
Albumin: 4.2 g/dL (ref 3.5–5.2)
Alkaline Phosphatase: 97 U/L (ref 39–117)
BUN: 18 mg/dL (ref 6–23)
CO2: 27 mEq/L (ref 19–32)
Calcium: 9.3 mg/dL (ref 8.4–10.5)
Chloride: 104 mEq/L (ref 96–112)
Creatinine, Ser: 1.31 mg/dL (ref 0.50–1.35)
Glucose, Bld: 147 mg/dL — ABNORMAL HIGH (ref 70–99)
Potassium: 4.1 mEq/L (ref 3.5–5.3)
Sodium: 140 mEq/L (ref 135–145)
Total Bilirubin: 0.5 mg/dL (ref 0.3–1.2)
Total Protein: 6.8 g/dL (ref 6.0–8.3)

## 2010-09-06 LAB — DIRECT ANTIGLOBULIN TEST (NOT AT ARMC)
DAT (Complement): NEGATIVE
DAT IgG: NEGATIVE

## 2010-09-06 LAB — IGG, IGA, IGM
IgA: 133 mg/dL (ref 68–379)
IgG (Immunoglobin G), Serum: 1200 mg/dL (ref 650–1600)
IgM, Serum: 101 mg/dL (ref 41–251)

## 2010-09-06 LAB — LACTATE DEHYDROGENASE: LDH: 166 U/L (ref 94–250)

## 2010-09-06 LAB — HAPTOGLOBIN: Haptoglobin: 125 mg/dL (ref 30–200)

## 2010-09-11 ENCOUNTER — Encounter: Payer: Self-pay | Admitting: Internal Medicine

## 2010-09-18 ENCOUNTER — Encounter: Payer: Self-pay | Admitting: Internal Medicine

## 2010-09-18 ENCOUNTER — Ambulatory Visit (INDEPENDENT_AMBULATORY_CARE_PROVIDER_SITE_OTHER): Payer: Medicare Other | Admitting: Internal Medicine

## 2010-09-18 VITALS — BP 116/68 | HR 63 | Ht 68.0 in | Wt 163.0 lb

## 2010-09-18 DIAGNOSIS — I6529 Occlusion and stenosis of unspecified carotid artery: Secondary | ICD-10-CM

## 2010-09-18 DIAGNOSIS — N529 Male erectile dysfunction, unspecified: Secondary | ICD-10-CM | POA: Insufficient documentation

## 2010-09-18 DIAGNOSIS — I2510 Atherosclerotic heart disease of native coronary artery without angina pectoris: Secondary | ICD-10-CM

## 2010-09-18 DIAGNOSIS — E785 Hyperlipidemia, unspecified: Secondary | ICD-10-CM

## 2010-09-18 MED ORDER — VARDENAFIL HCL 20 MG PO TABS: 20.0000 mg | ORAL_TABLET | Freq: Every day | ORAL | Status: DC | PRN

## 2010-09-18 NOTE — Assessment & Plan Note (Signed)
Will refill Levitra.

## 2010-09-18 NOTE — Patient Instructions (Signed)
Your physician has requested that you have a carotid duplex. This test is an ultrasound of the carotid arteries in your neck. It looks at blood flow through these arteries that supply the brain with blood. Allow one hour for this exam. There are no restrictions or special instructions.  Your physician recommends that you schedule a follow-up appointment in: 6 months  

## 2010-09-18 NOTE — Assessment & Plan Note (Signed)
Lipids look great. Continue simva.

## 2010-09-18 NOTE — Assessment & Plan Note (Signed)
Stable. Asymptomatic. He is due for his 6 month u/s.

## 2010-09-18 NOTE — Progress Notes (Signed)
HPI:  Jimmy [name] is a delightful 77 year old male with a history of CLL (WBC ~ 50k), coronary artery disease, status post bypass surgery in 2003. He had a Myoview in September 2008, which showed ejection fraction of 63% with no ischemia or infarct.  He also has a history of hyperlipidemia, hypertension,  carotid stensosis and minimal bilateral renal artery stenosis.  Echo 10/09 EF 55-65% mild diastolic dysfunction  Carotid u/s 4/11: RICA 60-79% LICA 0-39%. F/u u/s 10/11 stable   He returns today for routine followup. Doing well. Remains very active goes to [employer]  5 days /week. Rides bike, treadmill, crunches, etc. No CP or SOB.  SBP typically in low 100s. No focal neuro deficits/TIAs.  Last lipids 2/12: LDL 47  ROS: All systems negative except as listed in HPI, PMH and Problem List.  Past Medical History  Diagnosis Date  . CAD (coronary artery disease)     S/P CAB x 4 1/03; Myoview 08 EF 63% normal perfusion  . GERD (gastroesophageal reflux disease) 1990s  . Hyperlipemia 2002  . Hypertension 2002  . CLL (chronic lymphoblastic leukemia)     Dr. Welton Flakes  . Thrombocytopenia     Dr. Park Breed    Current Outpatient Prescriptions  Medication Sig Dispense Refill  . aspirin 81 MG EC tablet Take 162 mg by mouth daily.        . fish oil-omega-3 fatty acids 1000 MG capsule Take 3 capsules by mouth daily.        . Glycerin, Laxative, ([facility] SUPPOSITORY, ADULT, RE) Place 1 Units rectally as needed.        . simvastatin (ZOCOR) 20 MG tablet Take 20 mg by mouth at bedtime.        . valACYclovir (VALTREX) 1000 MG tablet Take 0.5 g by mouth 2 (two) times daily.        . vardenafil (LEVITRA) 20 MG tablet Take 20 mg by mouth daily as needed.        [name] DISCONTD: diphenhydrAMINE (SOMINEX) 25 MG tablet Take 25 mg by mouth at bedtime as needed.        [name] DISCONTD: promethazine-codeine (PHENERGAN WITH CODEINE) 6.25-10 MG/5ML syrup Take 5 mLs by mouth as needed.           PHYSICAL EXAM: Filed Vitals:   09/18/10 1120  BP: 116/68  Pulse: 63   General:  Well appearing. No resp difficulty HEENT: normal Neck: supple. JVP flat. Carotids 2+ bilaterally; soft R bruit. No lymphadenopathy or thryomegaly appreciated. Cor: PMI normal. Regular rate & rhythm. No rubs, gallops or murmurs. Lungs: clear Abdomen: soft, nontender, nondistended. No hepatosplenomegaly. No bruits or masses. Good bowel sounds. Extremities: no cyanosis, clubbing, rash, edema Neuro: alert & orientedx3, cranial nerves grossly intact. Moves all 4 extremities w/o difficulty. Affect pleasant.    ECG: NSR 63. Anterior Qs. No ST-T wave abnormalities.     ASSESSMENT & PLAN:

## 2010-09-18 NOTE — Assessment & Plan Note (Signed)
No evidence of ischemia. Continue current regimen.   

## 2010-10-02 ENCOUNTER — Encounter (INDEPENDENT_AMBULATORY_CARE_PROVIDER_SITE_OTHER): Payer: Medicare Other | Admitting: *Deleted

## 2010-10-02 DIAGNOSIS — I2510 Atherosclerotic heart disease of native coronary artery without angina pectoris: Secondary | ICD-10-CM

## 2010-10-02 DIAGNOSIS — I6529 Occlusion and stenosis of unspecified carotid artery: Secondary | ICD-10-CM

## 2010-10-05 ENCOUNTER — Encounter: Payer: Self-pay | Admitting: Internal Medicine

## 2010-10-10 ENCOUNTER — Telehealth: Payer: Self-pay | Admitting: *Deleted

## 2010-10-10 NOTE — Telephone Encounter (Signed)
Received form from [facility] that pt needs rx fro Vacuum Erection Device dx [phone] faxed to [phone], ok per Dr bensimhon order signed and faxed to above # atten Blondell Reveal

## 2010-11-06 ENCOUNTER — Other Ambulatory Visit: Payer: Self-pay | Admitting: *Deleted

## 2010-11-06 MED ORDER — HYDROCORTISONE ACETATE 25 MG RE SUPP: 25.0000 mg | Freq: Two times a day (BID) | RECTAL | Status: DC | PRN

## 2010-11-06 MED ORDER — LISINOPRIL 20 MG PO TABS: 20.0000 mg | ORAL_TABLET | Freq: Every day | ORAL | Status: DC

## 2010-11-06 MED ORDER — SIMVASTATIN 20 MG PO TABS: 20.0000 mg | ORAL_TABLET | Freq: Every day | ORAL | Status: DC

## 2010-11-06 NOTE — Telephone Encounter (Signed)
Simvastatin and Lisinopril should go to [facility].   Hydro[facility]ort[facility]one AC Supp[facility] to [facility] [phone].  Could not delineate separately in the system.

## 2010-11-06 NOTE — Telephone Encounter (Signed)
OK to fill all three for 6 months. He was seen in Mar.

## 2010-11-06 NOTE — Telephone Encounter (Signed)
Patient has not been here in some time for an OV.  Lisinopril was not on meds list, added for refill request.  Please advise.

## 2010-11-12 ENCOUNTER — Other Ambulatory Visit: Payer: Self-pay | Admitting: *Deleted

## 2010-11-12 MED ORDER — HYDROCORTISONE ACETATE 25 MG RE SUPP: 25.0000 mg | Freq: Two times a day (BID) | RECTAL | Status: DC | PRN

## 2010-12-06 ENCOUNTER — Other Ambulatory Visit: Payer: Self-pay | Admitting: Oncology

## 2010-12-06 ENCOUNTER — Encounter (HOSPITAL_BASED_OUTPATIENT_CLINIC_OR_DEPARTMENT_OTHER): Payer: Medicare Other | Admitting: Oncology

## 2010-12-06 DIAGNOSIS — C9110 Chronic lymphocytic leukemia of B-cell type not having achieved remission: Secondary | ICD-10-CM

## 2010-12-06 DIAGNOSIS — D649 Anemia, unspecified: Secondary | ICD-10-CM

## 2010-12-06 DIAGNOSIS — L089 Local infection of the skin and subcutaneous tissue, unspecified: Secondary | ICD-10-CM

## 2010-12-06 DIAGNOSIS — D696 Thrombocytopenia, unspecified: Secondary | ICD-10-CM

## 2010-12-06 LAB — CBC WITH DIFFERENTIAL/PLATELET
HCT: 37.1 % — ABNORMAL LOW (ref 38.4–49.9)
HGB: 12.6 g/dL — ABNORMAL LOW (ref 13.0–17.1)
MCH: 35.5 pg — ABNORMAL HIGH (ref 27.2–33.4)
MCHC: 33.9 g/dL (ref 32.0–36.0)
MCV: 104.7 fL — ABNORMAL HIGH (ref 79.3–98.0)
Platelets: 84 10*3/uL — ABNORMAL LOW (ref 140–400)
RBC: 3.54 10*6/uL — ABNORMAL LOW (ref 4.20–5.82)
RDW: 14.6 % (ref 11.0–14.6)
WBC: 61 10*3/uL (ref 4.0–10.3)

## 2010-12-06 LAB — MANUAL DIFFERENTIAL
ALC: 56.7 10*3/uL — ABNORMAL HIGH (ref 0.9–3.3)
ANC (CHCC manual diff): 3.1 10*3/uL (ref 1.5–6.5)
Band Neutrophils: 0 % (ref 0–10)
Basophil: 0 % (ref 0–2)
Blasts: 0 % (ref 0–0)
EOS: 1 % (ref 0–7)
LYMPH: 93 % — ABNORMAL HIGH (ref 14–49)
MONO: 1 % (ref 0–14)
Metamyelocytes: 0 % (ref 0–0)
Myelocytes: 0 % (ref 0–0)
Other Cell: 0 % (ref 0–0)
PLT EST: DECREASED
PROMYELO: 0 % (ref 0–0)
SEG: 5 % — ABNORMAL LOW (ref 38–77)
Variant Lymph: 0 % (ref 0–0)
nRBC: 0 % (ref 0–0)

## 2010-12-07 LAB — COMPREHENSIVE METABOLIC PANEL
ALT: 31 U/L (ref 0–53)
AST: 35 U/L (ref 0–37)
Albumin: 4.4 g/dL (ref 3.5–5.2)
Alkaline Phosphatase: 98 U/L (ref 39–117)
BUN: 19 mg/dL (ref 6–23)
CO2: 26 mEq/L (ref 19–32)
Calcium: 9.6 mg/dL (ref 8.4–10.5)
Chloride: 104 mEq/L (ref 96–112)
Creatinine, Ser: 1.45 mg/dL — ABNORMAL HIGH (ref 0.50–1.35)
Glucose, Bld: 111 mg/dL — ABNORMAL HIGH (ref 70–99)
Potassium: 4.5 mEq/L (ref 3.5–5.3)
Sodium: 139 mEq/L (ref 135–145)
Total Bilirubin: 0.5 mg/dL (ref 0.3–1.2)
Total Protein: 7 g/dL (ref 6.0–8.3)

## 2010-12-07 LAB — HAPTOGLOBIN: Haptoglobin: 92 mg/dL (ref 30–200)

## 2010-12-07 LAB — IGG, IGA, IGM
IgA: 134 mg/dL (ref 68–379)
IgG (Immunoglobin G), Serum: 1160 mg/dL (ref 650–1600)
IgM, Serum: 96 mg/dL (ref 41–251)

## 2010-12-07 LAB — DIRECT ANTIGLOBULIN TEST (NOT AT ARMC)
DAT (Complement): NEGATIVE
DAT IgG: NEGATIVE

## 2010-12-07 LAB — LACTATE DEHYDROGENASE: LDH: 181 U/L (ref 94–250)

## 2011-01-04 ENCOUNTER — Encounter (HOSPITAL_BASED_OUTPATIENT_CLINIC_OR_DEPARTMENT_OTHER): Payer: Medicare Other | Admitting: Oncology

## 2011-01-04 ENCOUNTER — Other Ambulatory Visit: Payer: Self-pay | Admitting: Oncology

## 2011-01-04 DIAGNOSIS — D649 Anemia, unspecified: Secondary | ICD-10-CM

## 2011-01-04 DIAGNOSIS — C9110 Chronic lymphocytic leukemia of B-cell type not having achieved remission: Secondary | ICD-10-CM

## 2011-01-04 DIAGNOSIS — D696 Thrombocytopenia, unspecified: Secondary | ICD-10-CM

## 2011-01-04 LAB — MANUAL DIFFERENTIAL
ALC: 45.3 10*3/uL — ABNORMAL HIGH (ref 0.9–3.3)
ANC (CHCC manual diff): 7.6 10*3/uL — ABNORMAL HIGH (ref 1.5–6.5)
Band Neutrophils: 0 % (ref 0–10)
Basophil: 0 % (ref 0–2)
Blasts: 0 % (ref 0–0)
EOS: 1 % (ref 0–7)
LYMPH: 83 % — ABNORMAL HIGH (ref 14–49)
MONO: 2 % (ref 0–14)
Metamyelocytes: 0 % (ref 0–0)
Myelocytes: 0 % (ref 0–0)
Other Cell: 0 % (ref 0–0)
PLT EST: DECREASED
PROMYELO: 0 % (ref 0–0)
SEG: 14 % — ABNORMAL LOW (ref 38–77)
Variant Lymph: 0 % (ref 0–0)
nRBC: 0 % (ref 0–0)

## 2011-01-04 LAB — CBC WITH DIFFERENTIAL/PLATELET
HCT: 36.7 % — ABNORMAL LOW (ref 38.4–49.9)
HGB: 12.3 g/dL — ABNORMAL LOW (ref 13.0–17.1)
MCH: 35.3 pg — ABNORMAL HIGH (ref 27.2–33.4)
MCHC: 33.6 g/dL (ref 32.0–36.0)
MCV: 105.1 fL — ABNORMAL HIGH (ref 79.3–98.0)
Platelets: 78 10*3/uL — ABNORMAL LOW (ref 140–400)
RBC: 3.49 10*6/uL — ABNORMAL LOW (ref 4.20–5.82)
RDW: 13.7 % (ref 11.0–14.6)
WBC: 54.6 10*3/uL (ref 4.0–10.3)

## 2011-01-14 ENCOUNTER — Other Ambulatory Visit: Payer: Self-pay | Admitting: Cardiology

## 2011-01-14 DIAGNOSIS — I6529 Occlusion and stenosis of unspecified carotid artery: Secondary | ICD-10-CM

## 2011-01-15 ENCOUNTER — Encounter: Payer: Medicare Other | Admitting: *Deleted

## 2011-01-15 ENCOUNTER — Other Ambulatory Visit: Payer: Self-pay | Admitting: Oncology

## 2011-01-15 DIAGNOSIS — C9110 Chronic lymphocytic leukemia of B-cell type not having achieved remission: Secondary | ICD-10-CM

## 2011-02-04 ENCOUNTER — Other Ambulatory Visit: Payer: Self-pay | Admitting: Oncology

## 2011-02-04 ENCOUNTER — Other Ambulatory Visit (HOSPITAL_BASED_OUTPATIENT_CLINIC_OR_DEPARTMENT_OTHER): Payer: Medicare Other | Admitting: Lab

## 2011-02-04 DIAGNOSIS — D649 Anemia, unspecified: Secondary | ICD-10-CM

## 2011-02-04 DIAGNOSIS — D696 Thrombocytopenia, unspecified: Secondary | ICD-10-CM

## 2011-02-04 DIAGNOSIS — C9110 Chronic lymphocytic leukemia of B-cell type not having achieved remission: Secondary | ICD-10-CM

## 2011-02-04 LAB — MANUAL DIFFERENTIAL
ALC: 0.6 10*3/uL — ABNORMAL LOW (ref 0.9–3.3)
ANC (CHCC manual diff): 63.4 10*3/uL — ABNORMAL HIGH (ref 1.5–6.5)
Band Neutrophils: 95 % — ABNORMAL HIGH (ref 0–10)
Basophil: 0 % (ref 0–2)
Blasts: 0 % (ref 0–0)
EOS: 0 % (ref 0–7)
LYMPH: 1 % — ABNORMAL LOW (ref 14–49)
MONO: 0 % (ref 0–14)
Metamyelocytes: 0 % (ref 0–0)
Myelocytes: 0 % (ref 0–0)
Other Cell: 0 % (ref 0–0)
PLT EST: DECREASED
PROMYELO: 0 % (ref 0–0)
SEG: 4 % — ABNORMAL LOW (ref 38–77)
Variant Lymph: 0 % (ref 0–0)
nRBC: 0 % (ref 0–0)

## 2011-02-04 LAB — CBC WITH DIFFERENTIAL/PLATELET
HCT: 39.1 % (ref 38.4–49.9)
HGB: 12.9 g/dL — ABNORMAL LOW (ref 13.0–17.1)
MCH: 34.9 pg — ABNORMAL HIGH (ref 27.2–33.4)
MCHC: 32.9 g/dL (ref 32.0–36.0)
MCV: 106.1 fL — ABNORMAL HIGH (ref 79.3–98.0)
Platelets: 83 10*3/uL — ABNORMAL LOW (ref 140–400)
RBC: 3.69 10*6/uL — ABNORMAL LOW (ref 4.20–5.82)
RDW: 14.3 % (ref 11.0–14.6)
WBC: 64 10*3/uL (ref 4.0–10.3)

## 2011-02-05 ENCOUNTER — Other Ambulatory Visit: Payer: Self-pay | Admitting: Oncology

## 2011-02-05 DIAGNOSIS — C9110 Chronic lymphocytic leukemia of B-cell type not having achieved remission: Secondary | ICD-10-CM

## 2011-02-05 LAB — COMPREHENSIVE METABOLIC PANEL
ALT: 28 U/L (ref 0–53)
AST: 33 U/L (ref 0–37)
Albumin: 4.6 g/dL (ref 3.5–5.2)
Alkaline Phosphatase: 104 U/L (ref 39–117)
BUN: 23 mg/dL (ref 6–23)
CO2: 29 mEq/L (ref 19–32)
Calcium: 10.1 mg/dL (ref 8.4–10.5)
Chloride: 103 mEq/L (ref 96–112)
Creatinine, Ser: 1.39 mg/dL — ABNORMAL HIGH (ref 0.50–1.35)
Glucose, Bld: 97 mg/dL (ref 70–99)
Potassium: 4.5 mEq/L (ref 3.5–5.3)
Sodium: 140 mEq/L (ref 135–145)
Total Bilirubin: 0.6 mg/dL (ref 0.3–1.2)
Total Protein: 6.9 g/dL (ref 6.0–8.3)

## 2011-02-05 LAB — HAPTOGLOBIN: Haptoglobin: 97 mg/dL (ref 30–200)

## 2011-02-05 LAB — LACTATE DEHYDROGENASE: LDH: 200 U/L (ref 94–250)

## 2011-02-05 LAB — IGG, IGA, IGM
IgA: 132 mg/dL (ref 68–379)
IgG (Immunoglobin G), Serum: 1090 mg/dL (ref 650–1600)
IgM, Serum: 88 mg/dL (ref 41–251)

## 2011-02-05 LAB — URIC ACID: Uric Acid, Serum: 8 mg/dL — ABNORMAL HIGH (ref 4.0–7.8)

## 2011-02-05 LAB — DIRECT ANTIGLOBULIN TEST (NOT AT ARMC)
DAT (Complement): NEGATIVE
DAT IgG: NEGATIVE

## 2011-02-05 NOTE — Progress Notes (Signed)
Quick Note:  Sent message to desk nurse to schedule another cbc. Orders put in chart and sent toscheduler. ______

## 2011-02-19 ENCOUNTER — Other Ambulatory Visit: Payer: Self-pay | Admitting: Family Medicine

## 2011-02-19 DIAGNOSIS — E785 Hyperlipidemia, unspecified: Secondary | ICD-10-CM

## 2011-02-19 DIAGNOSIS — R7309 Other abnormal glucose: Secondary | ICD-10-CM

## 2011-02-25 ENCOUNTER — Other Ambulatory Visit (INDEPENDENT_AMBULATORY_CARE_PROVIDER_SITE_OTHER): Payer: Medicare Other

## 2011-02-25 ENCOUNTER — Telehealth: Payer: Self-pay | Admitting: Radiology

## 2011-02-25 DIAGNOSIS — E785 Hyperlipidemia, unspecified: Secondary | ICD-10-CM

## 2011-02-25 DIAGNOSIS — C9110 Chronic lymphocytic leukemia of B-cell type not having achieved remission: Secondary | ICD-10-CM

## 2011-02-25 DIAGNOSIS — R7309 Other abnormal glucose: Secondary | ICD-10-CM

## 2011-02-25 LAB — CBC WITH DIFFERENTIAL/PLATELET
Basophils Absolute: 0.1 10*3/uL (ref 0.0–0.1)
Basophils Relative: 0.3 % (ref 0.0–3.0)
Eosinophils Absolute: 0.2 10*3/uL (ref 0.0–0.7)
Eosinophils Relative: 0.4 % (ref 0.0–5.0)
HCT: 39.1 % (ref 39.0–52.0)
Hemoglobin: 12.8 g/dL — ABNORMAL LOW (ref 13.0–17.0)
Lymphocytes Relative: 85.1 % — ABNORMAL HIGH (ref 12.0–46.0)
Lymphs Abs: 42.6 10*3/uL — ABNORMAL HIGH (ref 0.7–4.0)
MCHC: 32.8 g/dL (ref 30.0–36.0)
MCV: 105.9 fl — ABNORMAL HIGH (ref 78.0–100.0)
Monocytes Absolute: 2.3 10*3/uL — ABNORMAL HIGH (ref 0.1–1.0)
Monocytes Relative: 4.7 % (ref 3.0–12.0)
Neutro Abs: 4.8 10*3/uL (ref 1.4–7.7)
Neutrophils Relative %: 9.5 % — ABNORMAL LOW (ref 43.0–77.0)
Platelets: 83 10*3/uL — ABNORMAL LOW (ref 150.0–400.0)
RBC: 3.69 Mil/uL — ABNORMAL LOW (ref 4.22–5.81)
RDW: 14.1 % (ref 11.5–14.6)
WBC: 50.1 10*3/uL (ref 4.5–10.5)

## 2011-02-25 LAB — LIPID PANEL
Cholesterol: 93 mg/dL (ref 0–200)
HDL: 35.3 mg/dL — ABNORMAL LOW (ref >39.00)
LDL Cholesterol: 42 mg/dL (ref 0–99)
Total CHOL/HDL Ratio: 3
Triglycerides: 78 mg/dL (ref 0.0–149.0)
VLDL: 15.6 mg/dL (ref 0.0–40.0)

## 2011-02-25 LAB — MICROALBUMIN / CREATININE URINE RATIO
Creatinine,U: 52.8 mg/dL
Microalb Creat Ratio: 0.9 mg/g (ref 0.0–30.0)
Microalb, Ur: 0.5 mg/dL (ref 0.0–1.9)

## 2011-02-25 LAB — TSH: TSH: 3.45 u[IU]/mL (ref 0.35–5.50)

## 2011-02-25 NOTE — Telephone Encounter (Signed)
Jimmy [name] called a critical WBC - 50.1

## 2011-02-25 NOTE — Telephone Encounter (Signed)
Followed by Dr. Welton Flakes.  I'll defer to Dr. Welton Flakes and Hetty Ely.  Please fax this result to [facility] and then forward to [place] for his information.

## 2011-02-25 NOTE — Progress Notes (Signed)
Addended by: Alvina Chou on: 02/25/2011 10:45 AM   Modules accepted: Orders

## 2011-02-27 ENCOUNTER — Encounter: Payer: Self-pay | Admitting: Family Medicine

## 2011-02-28 ENCOUNTER — Ambulatory Visit (INDEPENDENT_AMBULATORY_CARE_PROVIDER_SITE_OTHER): Payer: Medicare Other | Admitting: Family Medicine

## 2011-02-28 ENCOUNTER — Encounter: Payer: Self-pay | Admitting: Family Medicine

## 2011-02-28 DIAGNOSIS — E785 Hyperlipidemia, unspecified: Secondary | ICD-10-CM

## 2011-02-28 DIAGNOSIS — N529 Male erectile dysfunction, unspecified: Secondary | ICD-10-CM

## 2011-02-28 DIAGNOSIS — C9110 Chronic lymphocytic leukemia of B-cell type not having achieved remission: Secondary | ICD-10-CM

## 2011-02-28 DIAGNOSIS — I10 Essential (primary) hypertension: Secondary | ICD-10-CM

## 2011-02-28 DIAGNOSIS — M778 Other enthesopathies, not elsewhere classified: Secondary | ICD-10-CM | POA: Insufficient documentation

## 2011-02-28 DIAGNOSIS — R7309 Other abnormal glucose: Secondary | ICD-10-CM

## 2011-02-28 DIAGNOSIS — M65839 Other synovitis and tenosynovitis, unspecified forearm: Secondary | ICD-10-CM

## 2011-02-28 DIAGNOSIS — I2510 Atherosclerotic heart disease of native coronary artery without angina pectoris: Secondary | ICD-10-CM

## 2011-02-28 DIAGNOSIS — M65849 Other synovitis and tenosynovitis, unspecified hand: Secondary | ICD-10-CM

## 2011-02-28 NOTE — Assessment & Plan Note (Signed)
Stable cont curr meds. BP Readings from Last 3 Encounters:  02/28/11 118/68  09/18/10 116/68  06/06/10 116/64

## 2011-02-28 NOTE — Assessment & Plan Note (Signed)
Great nos. Cont curr meds. Lab Results  Component Value Date   CHOL 93 02/25/2011   HDL 35.30* 02/25/2011   LDLCALC 42 02/25/2011   TRIG 78.0 02/25/2011   CHOLHDL 3 02/25/2011

## 2011-02-28 NOTE — Assessment & Plan Note (Signed)
No longer using Levitra. Now using vacuum device which works well for him.

## 2011-02-28 NOTE — Patient Instructions (Signed)
C one year for recheck, sooner as required.  Wife has established with Dr Reece Agar which would be good choice.

## 2011-02-28 NOTE — Assessment & Plan Note (Signed)
Looks stable altho WBC high, RBC low and Plts low.

## 2011-02-28 NOTE — Assessment & Plan Note (Signed)
Stable, to see cardiology next week again at 6 month f/u.

## 2011-02-28 NOTE — Assessment & Plan Note (Signed)
Stable. Discussed continued avoidance of sweets and carbs.

## 2011-02-28 NOTE — Telephone Encounter (Signed)
Please call pt with results looks stable

## 2011-02-28 NOTE — Progress Notes (Signed)
  Subjective:    [name], male    DOB: 20-Mar-1929, 77 y.o.   MRN: [id number]  HPI Pt here for Comp Exam. He has eye scheduled appt in a few weeks and decline eye exam today. He also wears hearing aids and declined hearing test.  He is doing well except for left hand strain.  He has cardiology and oncology appts next week.     Review of Systems  Constitutional: Negative for fever, chills, diaphoresis, appetite change, fatigue and unexpected weight change.  HENT: Negative for hearing loss, ear pain, tinnitus and ear discharge.   Eyes: Negative for pain, discharge and visual disturbance.  Respiratory: Negative for cough, shortness of breath and wheezing.   Cardiovascular: Negative for chest pain and palpitations.       No SOB w/ exertion  Gastrointestinal: Negative for nausea, vomiting, abdominal pain, diarrhea, constipation and blood in stool.       No heartburn or swallowing problems.  Genitourinary: Negative for dysuria, frequency and difficulty urinating.       No nocturia  Musculoskeletal: Negative for myalgias, back pain and arthralgias.  Skin: Negative for rash.       No itching or dryness.  Neurological: Negative for tremors and numbness.       No tingling or balance problems.  Hematological: Negative for adenopathy. Does not bruise/bleed easily.  Psychiatric/Behavioral: Negative for dysphoric mood and agitation.       Objective:   Physical Exam  Constitutional: He is oriented to person, place, and time. He appears well-developed and well-nourished. No distress.  HENT:  Head: Normocephalic and atraumatic.  Right Ear: External ear normal.  Left Ear: External ear normal.  Nose: Nose normal.  Mouth/Throat: Oropharynx is clear and moist.  Eyes: Conjunctivae and EOM are normal. Pupils are equal, round, and reactive to light. Right eye exhibits no discharge. Left eye exhibits no discharge. No scleral icterus.  Neck: Normal range of motion. Neck supple. No  thyromegaly present.  Cardiovascular: Normal rate, regular rhythm, normal heart sounds and intact distal pulses.   No murmur heard. Pulmonary/Chest: Effort normal and breath sounds normal. No respiratory distress. He has no wheezes.  Abdominal: Soft. Bowel sounds are normal. He exhibits no distension and no mass. There is no tenderness. There is no rebound and no guarding.  Genitourinary: Penis normal.       Rectal not done.  Musculoskeletal: Normal range of motion. He exhibits no edema.       Mild discomfort of the intrinsic muscles of the left hand with supination/pronation which radiates up in the ulnar side of the forearm.  Lymphadenopathy:    He has no cervical adenopathy.  Neurological: He is alert and oriented to person, place, and time. Coordination normal.  Skin: Skin is warm and dry. No rash noted. He is not diaphoretic.  Psychiatric: He has a normal mood and affect. His behavior is normal. Judgment and thought content normal.          Assessment & Plan:  HMPE  I have personally reviewed the Medicare Annual Wellness questionnaire and have noted 1. The patient's medical and social history 2. Their use of alcohol, tobacco or illicit drugs 3. Their current medications and supplements 4. The patient's functional ability including ADL's, fall risks, home safety risks and hearing or visual             impairment. 5. Diet and physical activities 6. Evidence for depression or mood disorders!

## 2011-02-28 NOTE — Assessment & Plan Note (Signed)
Discussed chronicity of this problem, use heat and tyl. Rest also helpful.

## 2011-03-01 ENCOUNTER — Telehealth: Payer: Self-pay | Admitting: *Deleted

## 2011-03-01 DIAGNOSIS — Z23 Encounter for immunization: Secondary | ICD-10-CM

## 2011-03-01 NOTE — Telephone Encounter (Signed)
Called s/w pt per MD " Labs are stable" pt verbalized understanding. Confirmed he will be at appt 12/6 lab/MD visit.

## 2011-03-04 ENCOUNTER — Other Ambulatory Visit (INDEPENDENT_AMBULATORY_CARE_PROVIDER_SITE_OTHER): Payer: Medicare Other

## 2011-03-04 DIAGNOSIS — Z23 Encounter for immunization: Secondary | ICD-10-CM

## 2011-03-07 ENCOUNTER — Other Ambulatory Visit: Payer: Self-pay | Admitting: Oncology

## 2011-03-07 ENCOUNTER — Telehealth: Payer: Self-pay | Admitting: *Deleted

## 2011-03-07 ENCOUNTER — Ambulatory Visit (HOSPITAL_BASED_OUTPATIENT_CLINIC_OR_DEPARTMENT_OTHER): Payer: Medicare Other | Admitting: Oncology

## 2011-03-07 ENCOUNTER — Ambulatory Visit (HOSPITAL_BASED_OUTPATIENT_CLINIC_OR_DEPARTMENT_OTHER): Payer: Medicare Other

## 2011-03-07 ENCOUNTER — Other Ambulatory Visit (HOSPITAL_BASED_OUTPATIENT_CLINIC_OR_DEPARTMENT_OTHER): Payer: Medicare Other | Admitting: Lab

## 2011-03-07 VITALS — BP 134/72 | HR 64 | Temp 98.3°F | Ht 69.25 in | Wt 169.8 lb

## 2011-03-07 DIAGNOSIS — C9110 Chronic lymphocytic leukemia of B-cell type not having achieved remission: Secondary | ICD-10-CM

## 2011-03-07 DIAGNOSIS — D696 Thrombocytopenia, unspecified: Secondary | ICD-10-CM

## 2011-03-07 DIAGNOSIS — D7589 Other specified diseases of blood and blood-forming organs: Secondary | ICD-10-CM

## 2011-03-07 DIAGNOSIS — D649 Anemia, unspecified: Secondary | ICD-10-CM

## 2011-03-07 LAB — MANUAL DIFFERENTIAL
ALC: 58.9 10*3/uL — ABNORMAL HIGH (ref 0.9–3.3)
ANC (CHCC manual diff): 4.4 10*3/uL (ref 1.5–6.5)
Band Neutrophils: 0 % (ref 0–10)
Basophil: 0 % (ref 0–2)
Blasts: 0 % (ref 0–0)
EOS: 0 % (ref 0–7)
LYMPH: 93 % — ABNORMAL HIGH (ref 14–49)
MONO: 0 % (ref 0–14)
Metamyelocytes: 0 % (ref 0–0)
Myelocytes: 0 % (ref 0–0)
Other Cell: 0 % (ref 0–0)
PLT EST: DECREASED
PROMYELO: 0 % (ref 0–0)
SEG: 7 % — ABNORMAL LOW (ref 38–77)
Variant Lymph: 0 % (ref 0–0)
nRBC: 0 % (ref 0–0)

## 2011-03-07 LAB — CBC WITH DIFFERENTIAL/PLATELET
HCT: 39.5 % (ref 38.4–49.9)
HGB: 13.1 g/dL (ref 13.0–17.1)
MCH: 34.8 pg — ABNORMAL HIGH (ref 27.2–33.4)
MCHC: 33.1 g/dL (ref 32.0–36.0)
MCV: 105.1 fL — ABNORMAL HIGH (ref 79.3–98.0)
Platelets: 87 10*3/uL — ABNORMAL LOW (ref 140–400)
RBC: 3.76 10*6/uL — ABNORMAL LOW (ref 4.20–5.82)
RDW: 13.8 % (ref 11.0–14.6)
WBC: 63.3 10*3/uL (ref 4.0–10.3)

## 2011-03-07 NOTE — Progress Notes (Signed)
OFFICE PROGRESS NOTE  CC:  Laurita Quint, MD, MD [address] [name] [address] [place] [address]  DIAGNOSIS: 77 year old gentleman with stage I chronic lymphocytic leukemia diagnosed 4 years ago  PRIOR THERAPY: Observation  CURRENT THERAPY: Observation  INTERVAL HISTORY: Jimmy [name] 77 y.o. male returns for a lobe visit today. We have been watching him closely as he has had a rise in his white count as well as a drop in his platelet count. His hemoglobin has stabilized. He himself is not having any problems except for some fatigue. He is also noted some bruising easily especially in his left upper arm. Denying any hematuria hematochezia melena hemoptysis hematemesis. He has not had any fevers chills night sweats no malaise. Remainder of the 10 point review of systems is negative.  MEDICAL HISTORY: Past Medical History  Diagnosis Date  . CAD (coronary artery disease)     S/P CAB x 4 1/03; Myoview 08 EF 63% normal perfusion  . GERD (gastroesophageal reflux disease) 1990s  . Hyperlipemia 2002  . Hypertension 2002  . CLL (chronic lymphoblastic leukemia)     Dr. Welton Flakes  . Thrombocytopenia     Dr. Park Breed    ALLERGIES:  is allergic to band-aid liquid bandage.  MEDICATIONS:  Current Outpatient Prescriptions  Medication Sig Dispense Refill  . aspirin 81 MG EC tablet Take 162 mg by mouth daily.        . fish oil-omega-3 fatty acids 1000 MG capsule Take 3 capsules by mouth daily.        . Glycerin, Laxative, ([facility] SUPPOSITORY, ADULT, RE) Place 1 Units rectally as needed.        . hydrocortisone (ANUSOL-HC) 25 MG suppository Place 1 suppository (25 mg total) rectally 2 (two) times daily as needed.  12 suppository  3  . lisinopril (PRINIVIL,ZESTRIL) 20 MG tablet Take 1 tablet (20 mg total) by mouth daily.  90 tablet  3  . simvastatin (ZOCOR) 20 MG tablet Take 1 tablet (20 mg total) by mouth at bedtime.  90 tablet  3  . valACYclovir (VALTREX) 1000 MG tablet Take  0.5 g by mouth 2 (two) times daily.          SURGICAL HISTORY:  Past Surgical History  Procedure Date  . Hepatic artery angioplasty [address], [place]  . Hemorroidectomy 1954    Fissure repair, [place]  . Kidney stone surgery 1977    Dr Jenne Pane  . Lithotripsy 1990s    Multiple  . Angioplasty 1998  . Coronary angioplasty 1998  . Coronary artery bypass graft 04/23/2001    x4, Dr. Nydia Bouton  . Esophagogastroduodenoscopy 11/29/1987    Gastritis  . Colonoscopy 11/29/1987    Normal  . Colonoscopy 02/07/2003    Hemm. Internal, focal proctitis, negative biopsy  . Colonoscopy 12/12/2004    Internal external hemorrhoids, +proctits, negative biopsy  . Cataract extraction, bilateral     R 1/09, L 8/09  . Ett 12/10/2006    Persantine myoview nml    REVIEW OF SYSTEMS:  Pertinent items are noted in HPI.     ECOG PERFORMANCE STATUS: 1 - Symptomatic but completely ambulatory  Blood pressure 134/72, pulse 64, temperature 98.3 F (36.8 C), temperature source Oral, height 5' 9.25" (1.759 m), weight 169 lb 12.8 oz (77.021 kg).  LABORATORY DATA: Lab Results  Component Value Date   WBC 63.3* 03/07/2011   HGB 13.1 03/07/2011   HCT 39.5 03/07/2011   MCV 105.1*  03/07/2011   PLT 87* 03/07/2011      Chemistry      Component Value Date/Time   NA 140 02/04/2011 1313   NA 140 02/04/2011 1313   NA 140 02/04/2011 1313   NA 135 11/29/2009 1245   K 4.5 02/04/2011 1313   K 4.5 02/04/2011 1313   K 4.5 02/04/2011 1313   K 5.3* 11/29/2009 1245   CL 103 02/04/2011 1313   CL 103 02/04/2011 1313   CL 103 02/04/2011 1313   CL 95* 11/29/2009 1245   CO2 29 02/04/2011 1313   CO2 29 02/04/2011 1313   CO2 29 02/04/2011 1313   CO2 27 11/29/2009 1245   BUN 23 02/04/2011 1313   BUN 23 02/04/2011 1313   BUN 23 02/04/2011 1313   BUN 21 11/29/2009 1245   CREATININE 1.39* 02/04/2011 1313   CREATININE 1.39* 02/04/2011 1313   CREATININE 1.39* 02/04/2011 1313   CREATININE 1.8* 11/29/2009 1245      Component Value Date/Time     CALCIUM 10.1 02/04/2011 1313   CALCIUM 10.1 02/04/2011 1313   CALCIUM 10.1 02/04/2011 1313   CALCIUM 9.3 05/30/2010 0000   CALCIUM 9.4 11/29/2009 1245   ALKPHOS 104 02/04/2011 1313   ALKPHOS 104 02/04/2011 1313   ALKPHOS 104 02/04/2011 1313   ALKPHOS 119* 10/24/2009 0919   AST 33 02/04/2011 1313   AST 33 02/04/2011 1313   AST 33 02/04/2011 1313   AST 26 10/24/2009 0919   ALT 28 02/04/2011 1313   ALT 28 02/04/2011 1313   ALT 28 02/04/2011 1313   BILITOT 0.6 02/04/2011 1313   BILITOT 0.6 02/04/2011 1313   BILITOT 0.6 02/04/2011 1313   BILITOT 0.90 10/24/2009 0919       RADIOGRAPHIC STUDIES:  No results found.  ASSESSMENT: 77 year old gentleman with stage I chronic lymphocytic leukemia. His today his white count is 63.3 which is stable from November 2012. However his platelets are still low at 87,000 hemoglobin is 13.1. Also notable is a macrocytosis with an MCV of 105. At this time a recommendation is for patient to get a B12 and folate levels just to see why he is macrocytic now. If that does not answered the questions and I would proceed with a bone marrow biopsy and aspirate.   PLAN: Check B12 and folate levels. We will also subject another CBC in 3 weeks' time.possibly do a bone marrow biopsy and aspirate in the future    All questions were answered. The patient knows to call the clinic with any problems, questions or concerns. We can certainly see the patient much sooner if necessary.  I spent 20 minutes counseling the patient face to face. The total time spent in the appointment was 30 minutes.    Drue Second, MD Medical/Oncology [facility] [phone] (beeper) [phone] (Office)  03/07/2011, 11:33 AM

## 2011-03-07 NOTE — Telephone Encounter (Signed)
gave patient appointment for 03-25-2011 starting at 10:30am printed out calendar and gave to the patient

## 2011-03-08 ENCOUNTER — Telehealth: Payer: Self-pay | Admitting: *Deleted

## 2011-03-08 ENCOUNTER — Encounter: Payer: Self-pay | Admitting: Cardiovascular Disease

## 2011-03-08 NOTE — Telephone Encounter (Signed)
Pt.notified

## 2011-03-08 NOTE — Telephone Encounter (Signed)
Message copied by Cooper Render on Fri Mar 08, 2011 11:30 AM ------      Message from: Victorino December      Created: Fri Mar 08, 2011  6:20 AM       Call patient:B12 and Folate result normal, will set you up for bone marrow after christmas

## 2011-03-13 LAB — VITAMIN B12 DEFICIENCY PANEL - CHCC

## 2011-03-13 LAB — VITAMIN B12: Vitamin B-12: 639 pg/mL (ref 211–911)

## 2011-03-13 LAB — FOLATE: Folate: >20 ng/mL

## 2011-03-21 ENCOUNTER — Encounter: Payer: Self-pay | Admitting: Cardiovascular Disease

## 2011-03-21 ENCOUNTER — Ambulatory Visit (INDEPENDENT_AMBULATORY_CARE_PROVIDER_SITE_OTHER): Payer: Medicare Other | Admitting: Cardiovascular Disease

## 2011-03-21 VITALS — BP 124/62 | HR 64 | Ht 69.0 in | Wt 168.2 lb

## 2011-03-21 DIAGNOSIS — I6529 Occlusion and stenosis of unspecified carotid artery: Secondary | ICD-10-CM

## 2011-03-21 DIAGNOSIS — C9110 Chronic lymphocytic leukemia of B-cell type not having achieved remission: Secondary | ICD-10-CM

## 2011-03-21 DIAGNOSIS — E785 Hyperlipidemia, unspecified: Secondary | ICD-10-CM

## 2011-03-21 DIAGNOSIS — I2510 Atherosclerotic heart disease of native coronary artery without angina pectoris: Secondary | ICD-10-CM

## 2011-03-21 DIAGNOSIS — I10 Essential (primary) hypertension: Secondary | ICD-10-CM

## 2011-03-21 NOTE — Patient Instructions (Signed)
You are doing well. No medication changes were made.  Please call us if you have new issues that need to be addressed before your next appt.  Your physician wants you to follow-up in: 6 months.  You will receive a reminder letter in the mail two months in advance. If you don't receive a letter, please call our office to schedule the follow-up appointment.   

## 2011-03-21 NOTE — Assessment & Plan Note (Signed)
Blood pressure is well controlled on today's visit. No changes made to the medications. 

## 2011-03-21 NOTE — Assessment & Plan Note (Signed)
Currently with no symptoms of angina. No further workup at this time. Continue current medication regimen. 

## 2011-03-21 NOTE — Assessment & Plan Note (Signed)
Cholesterol is at goal on the current lipid regimen. No changes to the medications were made.  

## 2011-03-21 NOTE — Assessment & Plan Note (Signed)
History of CLL, being closely followed by oncology.

## 2011-03-21 NOTE — Progress Notes (Signed)
[name], male    DOB: 1928/08/11, 77 y.o.   MRN: [id number]  HPI Comments: 77 year old male with a history of CLL (WBC ~ 50k), coronary artery disease, status post bypass surgery in 2003.  Myoview in September 2008, which showed ejection fraction of 63% with no ischemia or infarct.   history of hyperlipidemia, hypertension,  carotid stensosis and minimal bilateral renal artery stenosis.   Echo 10/09 EF 55-65% mild diastolic dysfunction Carotid u/s 2012: RICA 60-79% LICA 0-39%.   He returns today for routine followup. Doing well.  Remains very active goes to [employer]  5 days /week.  No CP or SOB.   No focal neuro deficits/TIAs. No new complaints  EKG shows normal sinus rhythm with rate 64 beats per minute with no significant ST or T wave changes  Last lipids 2/12: LDL 47    Outpatient Encounter Prescriptions as of 03/21/2011  Medication Sig Dispense Refill  . aspirin 81 MG EC tablet Take 162 mg by mouth daily.        . fish oil-omega-3 fatty acids 1000 MG capsule Take 3 capsules by mouth daily.        . Glycerin, Laxative, ([facility] SUPPOSITORY, ADULT, RE) Place 1 Units rectally as needed.        . hydrocortisone (ANUSOL-HC) 25 MG suppository Place 1 suppository (25 mg total) rectally 2 (two) times daily as needed.  12 suppository  3  . lisinopril (PRINIVIL,ZESTRIL) 20 MG tablet Take 1 tablet (20 mg total) by mouth daily.  90 tablet  3  . simvastatin (ZOCOR) 20 MG tablet Take 1 tablet (20 mg total) by mouth at bedtime.  90 tablet  3  . valACYclovir (VALTREX) 1000 MG tablet Take 0.5 g by mouth 2 (two) times daily.           Review of Systems  Constitutional: Negative.   HENT: Negative.   Eyes: Negative.   Respiratory: Negative.   Cardiovascular: Negative.   Gastrointestinal: Negative.   Musculoskeletal: Negative.   Skin: Negative.   Neurological: Negative.   Hematological: Negative.   Psychiatric/Behavioral: Negative.   All other systems reviewed and are  negative.    BP 124/62  Pulse 64  Ht 5\' 9"  (1.753 m)  Wt 168 lb 4 oz (76.318 kg)  BMI 24.85 kg/m2  Physical Exam  Nursing note and vitals reviewed. Constitutional: He is oriented to person, place, and time. He appears well-developed and well-nourished.  HENT:  Head: Normocephalic.  Nose: Nose normal.  Mouth/Throat: Oropharynx is clear and moist.  Eyes: Conjunctivae are normal. Pupils are equal, round, and reactive to light.  Neck: Normal range of motion. Neck supple. No JVD present.  Cardiovascular: Normal rate, regular rhythm, S1 normal, S2 normal, normal heart sounds and intact distal pulses.  Exam reveals no gallop and no friction rub.   No murmur heard. Pulmonary/Chest: Effort normal and breath sounds normal. No respiratory distress. He has no wheezes. He has no rales. He exhibits no tenderness.  Abdominal: Soft. Bowel sounds are normal. He exhibits no distension. There is no tenderness.  Musculoskeletal: Normal range of motion. He exhibits no edema and no tenderness.  Lymphadenopathy:    He has no cervical adenopathy.  Neurological: He is alert and oriented to person, place, and time. Coordination normal.  Skin: Skin is warm and dry. No rash noted. No erythema.  Psychiatric: He has a normal mood and affect. His behavior is normal. Judgment and thought content normal.  Assessment and Plan

## 2011-03-21 NOTE — Assessment & Plan Note (Signed)
Stable 60% lesion on the right. Continue aggressive cholesterol management.

## 2011-03-25 ENCOUNTER — Telehealth: Payer: Self-pay | Admitting: *Deleted

## 2011-03-25 ENCOUNTER — Other Ambulatory Visit: Payer: Medicare Other | Admitting: Lab

## 2011-03-25 ENCOUNTER — Ambulatory Visit (HOSPITAL_BASED_OUTPATIENT_CLINIC_OR_DEPARTMENT_OTHER): Payer: Medicare Other | Admitting: Oncology

## 2011-03-25 DIAGNOSIS — C9110 Chronic lymphocytic leukemia of B-cell type not having achieved remission: Secondary | ICD-10-CM

## 2011-03-25 LAB — CBC WITH DIFFERENTIAL/PLATELET
HCT: 38.4 % (ref 38.4–49.9)
HGB: 12.8 g/dL — ABNORMAL LOW (ref 13.0–17.1)
MCH: 34.7 pg — ABNORMAL HIGH (ref 27.2–33.4)
MCHC: 33.4 g/dL (ref 32.0–36.0)
MCV: 103.9 fL — ABNORMAL HIGH (ref 79.3–98.0)
Platelets: 79 10*3/uL — ABNORMAL LOW (ref 140–400)
RBC: 3.69 10*6/uL — ABNORMAL LOW (ref 4.20–5.82)
RDW: 14.1 % (ref 11.0–14.6)
WBC: 47.9 10*3/uL — ABNORMAL HIGH (ref 4.0–10.3)

## 2011-03-25 LAB — MANUAL DIFFERENTIAL
ALC: 41.2 10*3/uL — ABNORMAL HIGH (ref 0.9–3.3)
ANC (CHCC manual diff): 5.3 10*3/uL (ref 1.5–6.5)
Band Neutrophils: 0 % (ref 0–10)
Basophil: 0 % (ref 0–2)
Blasts: 0 % (ref 0–0)
EOS: 1 % (ref 0–7)
LYMPH: 86 % — ABNORMAL HIGH (ref 14–49)
MONO: 2 % (ref 0–14)
Metamyelocytes: 0 % (ref 0–0)
Myelocytes: 0 % (ref 0–0)
Other Cell: 0 % (ref 0–0)
PLT EST: DECREASED
PROMYELO: 0 % (ref 0–0)
SEG: 11 % — ABNORMAL LOW (ref 38–77)
Variant Lymph: 0 % (ref 0–0)
nRBC: 0 % (ref 0–0)

## 2011-03-25 NOTE — Telephone Encounter (Signed)
gave patient appointment for 04-29-2011 starting at 10:00am printed out calendar and gave to the patient

## 2011-03-25 NOTE — Progress Notes (Signed)
OFFICE PROGRESS NOTE  CC:  Laurita Quint, MD, MD [address] [place] [address] [place] [address]  DIAGNOSIS: 77 year old gentleman with stage I chronic lymphocytic leukemia diagnosed 4 years ago  PRIOR THERAPY: Observation  CURRENT THERAPY: Observation  INTERVAL HISTORY: Jimmy [name] 77 y.o. male returns for a followup visit today. He has been having CBCs performed on a regular basis. Today he had a CBC done his white count is 47,000. However the platelets are slightly lower than previously. The platelets have dropped to 79,000 and wears previously there were 87,000. He overall feels well. He is suffering from a cold upper respiratory tract infection. He is doing symptomatic management. He has not had any fevers or chills or night sweats with this. He is eating well and is maintaining his weight. He denies having any bleeding problems he has no hematuria hematochezia melena hemoptysis or hematemesis. There is no easy bruising. Remainder of the 10 point review of systems is negative.  MEDICAL HISTORY: Past Medical History  Diagnosis Date  . CAD (coronary artery disease)     S/P CAB x 4 1/03; Myoview 08 EF 63% normal perfusion  . GERD (gastroesophageal reflux disease) 1990s  . Hyperlipemia 2002  . Hypertension 2002  . CLL (chronic lymphoblastic leukemia)     Dr. Welton Flakes  . Thrombocytopenia     Dr. Park Breed    ALLERGIES:  is allergic to band-aid liquid bandage.  MEDICATIONS:  Current Outpatient Prescriptions  Medication Sig Dispense Refill  . aspirin 81 MG EC tablet Take 162 mg by mouth daily.        [name] Dextromethorphan-Guaifenesin (GUAIFENESIN DM COUGH & CHEST) 10-200 MG/5ML LIQD Take 5 mLs by mouth.        . fish oil-omega-3 fatty acids 1000 MG capsule Take 3 capsules by mouth daily.        . Glycerin, Laxative, ([employer] SUPPOSITORY, ADULT, RE) Place 1 Units rectally as needed.        . hydrocortisone (ANUSOL-HC) 25 MG suppository Place 1 suppository (25 mg  total) rectally 2 (two) times daily as needed.  12 suppository  3  . lisinopril (PRINIVIL,ZESTRIL) 20 MG tablet Take 1 tablet (20 mg total) by mouth daily.  90 tablet  3  . simvastatin (ZOCOR) 20 MG tablet Take 1 tablet (20 mg total) by mouth at bedtime.  90 tablet  3  . valACYclovir (VALTREX) 1000 MG tablet Take 0.5 g by mouth 2 (two) times daily.          SURGICAL HISTORY:  Past Surgical History  Procedure Date  . Hepatic artery angioplasty [address] [place]  . Hemorroidectomy 1954    Fissure repair, [place]  . Kidney stone surgery 1977    Dr Jenne Pane  . Lithotripsy 1990s    Multiple  . Angioplasty 1998  . Coronary angioplasty 1998  . Coronary artery bypass graft 04/23/2001    x4, Dr. Nydia Bouton  . Esophagogastroduodenoscopy 11/29/1987    Gastritis  . Colonoscopy 11/29/1987    Normal  . Colonoscopy 02/07/2003    Hemm. Internal, focal proctitis, negative biopsy  . Colonoscopy 12/12/2004    Internal external hemorrhoids, +proctits, negative biopsy  . Cataract extraction, bilateral     R 1/09, L 8/09  . Ett 12/10/2006    Persantine myoview nml    REVIEW OF SYSTEMS:  Pertinent items are noted in HPI.     ECOG PERFORMANCE STATUS: 1 - Symptomatic but completely ambulatory  Blood pressure 150/68, pulse 73, temperature 98.3 F (36.8 C), temperature source Oral, height 5\' 9"  (1.753 m), weight 170 lb 4.8 oz (77.248 kg).  LABORATORY DATA: Lab Results  Component Value Date   WBC 63.3* 03/07/2011   HGB 13.1 03/07/2011   HCT 39.5 03/07/2011   MCV 105.1* 03/07/2011   PLT 87* 03/07/2011      RADIOGRAPHIC STUDIES:  No results found.  ASSESSMENT: 77 year old gentleman with stage I chronic lymphocytic leukemia.patient overall is doing well. His white count has dropped a little bit to 47,000 which is great. But the platelets are also down to about 79,000 and concerning. However he has no bleeding problems.  PLAN: I have recommended followup in about one month's time. At that time  we will do a CBC if his platelets dropped further or his white count goes up further then we will go ahead and do a bone marrow biopsy and aspirate. All questions were answered today.  All questions were answered. The patient knows to call the clinic with any problems, questions or concerns. We can certainly see the patient much sooner if necessary.  I spent 20 minutes counseling the patient face to face. The total time spent in the appointment was 30 minutes.    Drue Second, MD Medical/Oncology [facility] [phone] (beeper) [phone] (Office)  03/25/2011, 11:52 AM

## 2011-04-04 ENCOUNTER — Other Ambulatory Visit: Payer: Self-pay | Admitting: Internal Medicine

## 2011-04-04 MED ORDER — HYDROCORTISONE ACETATE 25 MG RE SUPP: 25.0000 mg | Freq: Two times a day (BID) | RECTAL | Status: DC | PRN

## 2011-04-04 NOTE — Telephone Encounter (Signed)
Faxed Rx to pharmacy  

## 2011-04-29 ENCOUNTER — Other Ambulatory Visit: Payer: Medicare Other | Admitting: Lab

## 2011-04-29 ENCOUNTER — Ambulatory Visit: Payer: Medicare Other | Admitting: Oncology

## 2011-05-08 ENCOUNTER — Ambulatory Visit (HOSPITAL_BASED_OUTPATIENT_CLINIC_OR_DEPARTMENT_OTHER): Payer: Medicare Other | Admitting: Oncology

## 2011-05-08 ENCOUNTER — Other Ambulatory Visit (HOSPITAL_BASED_OUTPATIENT_CLINIC_OR_DEPARTMENT_OTHER): Payer: Medicare Other | Admitting: Lab

## 2011-05-08 ENCOUNTER — Telehealth: Payer: Self-pay | Admitting: Oncology

## 2011-05-08 VITALS — BP 121/61 | HR 69 | Temp 98.1°F | Ht 69.0 in | Wt 169.2 lb

## 2011-05-08 DIAGNOSIS — D649 Anemia, unspecified: Secondary | ICD-10-CM

## 2011-05-08 DIAGNOSIS — C9110 Chronic lymphocytic leukemia of B-cell type not having achieved remission: Secondary | ICD-10-CM

## 2011-05-08 LAB — MANUAL DIFFERENTIAL
ALC: 52.8 10*3/uL — ABNORMAL HIGH (ref 0.9–3.3)
ANC (CHCC manual diff): 3.4 10*3/uL (ref 1.5–6.5)
Band Neutrophils: 0 % (ref 0–10)
Basophil: 0 % (ref 0–2)
Blasts: 0 % (ref 0–0)
EOS: 0 % (ref 0–7)
LYMPH: 93 % — ABNORMAL HIGH (ref 14–49)
MONO: 1 % (ref 0–14)
Metamyelocytes: 0 % (ref 0–0)
Myelocytes: 0 % (ref 0–0)
Other Cell: 0 % (ref 0–0)
PLT EST: DECREASED
PROMYELO: 0 % (ref 0–0)
SEG: 6 % — ABNORMAL LOW (ref 38–77)
Variant Lymph: 0 % (ref 0–0)
nRBC: 0 % (ref 0–0)

## 2011-05-08 LAB — CBC WITH DIFFERENTIAL/PLATELET
HCT: 38.8 % (ref 38.4–49.9)
HGB: 12.7 g/dL — ABNORMAL LOW (ref 13.0–17.1)
MCH: 34.3 pg — ABNORMAL HIGH (ref 27.2–33.4)
MCHC: 32.7 g/dL (ref 32.0–36.0)
MCV: 104.8 fL — ABNORMAL HIGH (ref 79.3–98.0)
Platelets: 78 10*3/uL — ABNORMAL LOW (ref 140–400)
RBC: 3.7 10*6/uL — ABNORMAL LOW (ref 4.20–5.82)
RDW: 14.6 % (ref 11.0–14.6)
WBC: 56.8 10*3/uL (ref 4.0–10.3)

## 2011-05-08 NOTE — Telephone Encounter (Signed)
gve the pt his march-may 2013 appts

## 2011-05-09 LAB — COMPREHENSIVE METABOLIC PANEL
ALT: 29 U/L (ref 0–53)
AST: 37 U/L (ref 0–37)
Albumin: 4.5 g/dL (ref 3.5–5.2)
Alkaline Phosphatase: 107 U/L (ref 39–117)
BUN: 19 mg/dL (ref 6–23)
CO2: 26 mEq/L (ref 19–32)
Calcium: 10 mg/dL (ref 8.4–10.5)
Chloride: 103 mEq/L (ref 96–112)
Creatinine, Ser: 1.55 mg/dL — ABNORMAL HIGH (ref 0.50–1.35)
Glucose, Bld: 125 mg/dL — ABNORMAL HIGH (ref 70–99)
Potassium: 4.5 mEq/L (ref 3.5–5.3)
Sodium: 141 mEq/L (ref 135–145)
Total Bilirubin: 0.6 mg/dL (ref 0.3–1.2)
Total Protein: 7 g/dL (ref 6.0–8.3)

## 2011-05-09 LAB — HAPTOGLOBIN: Haptoglobin: 83 mg/dL (ref 30–200)

## 2011-05-09 LAB — IGG, IGA, IGM
IgA: 125 mg/dL (ref 68–379)
IgG (Immunoglobin G), Serum: 1150 mg/dL (ref 650–1600)
IgM, Serum: 91 mg/dL (ref 41–251)

## 2011-05-09 NOTE — Progress Notes (Signed)
OFFICE PROGRESS NOTE  CC:  Jimmy Quint, MD, MD [address] [place] [address] [place] [address]  DIAGNOSIS: 78 year old gentleman with stage I chronic lymphocytic leukemia diagnosed 4 years ago  PRIOR THERAPY: Observation  CURRENT THERAPY: Observation  INTERVAL HISTORY: Jimmy [name] 78 y.o. male returns for a followup visit today. He has been having CBCs performed on a regular basis. Overall he has done well. His white count is slightly up but he remains quite asymptomatic. His platelets are 78,000. He himself denies any fevers chills night sweats headaches shortness of breath chest pains palpitations he has no myalgias or arthralgias. He has not noticed any easy bruising or bleeding. He has not had any recent hospitalizations for any infections. Remainder of the 10 point review of systems is negative  MEDICAL HISTORY: Past Medical History  Diagnosis Date  . CAD (coronary artery disease)     S/P CAB x 4 1/03; Myoview 08 EF 63% normal perfusion  . GERD (gastroesophageal reflux disease) 1990s  . Hyperlipemia 2002  . Hypertension 2002  . CLL (chronic lymphoblastic leukemia)     Dr. Welton Flakes  . Thrombocytopenia     Dr. Park Breed    ALLERGIES:  is allergic to band-aid liquid bandage.  MEDICATIONS:  Current Outpatient Prescriptions  Medication Sig Dispense Refill  . aspirin 81 MG EC tablet Take 162 mg by mouth daily.        [name] Dextromethorphan-Guaifenesin (GUAIFENESIN DM COUGH & CHEST) 10-200 MG/5ML LIQD Take 5 mLs by mouth.        . fish oil-omega-3 fatty acids 1000 MG capsule Take 3 capsules by mouth daily.        . Glycerin, Laxative, ([employer] SUPPOSITORY, ADULT, RE) Place 1 Units rectally as needed.        . hydrocortisone (ANUSOL-HC) 25 MG suppository Place 1 suppository (25 mg total) rectally 2 (two) times daily as needed.  12 suppository  3  . lisinopril (PRINIVIL,ZESTRIL) 20 MG tablet Take 1 tablet (20 mg total) by mouth daily.  90 tablet  3  . simvastatin  (ZOCOR) 20 MG tablet Take 1 tablet (20 mg total) by mouth at bedtime.  90 tablet  3  . valACYclovir (VALTREX) 1000 MG tablet Take 0.5 g by mouth 2 (two) times daily.          SURGICAL HISTORY:  Past Surgical History  Procedure Date  . Hepatic artery angioplasty [address], [place]  . Hemorroidectomy 1954    Fissure repair, [place]  . Kidney stone surgery 1977    Dr Jenne Pane  . Lithotripsy 1990s    Multiple  . Angioplasty 1998  . Coronary angioplasty 1998  . Coronary artery bypass graft 04/23/2001    x4, Dr. Nydia Bouton  . Esophagogastroduodenoscopy 11/29/1987    Gastritis  . Colonoscopy 11/29/1987    Normal  . Colonoscopy 02/07/2003    Hemm. Internal, focal proctitis, negative biopsy  . Colonoscopy 12/12/2004    Internal external hemorrhoids, +proctits, negative biopsy  . Cataract extraction, bilateral     R 1/09, L 8/09  . Ett 12/10/2006    Persantine myoview nml    REVIEW OF SYSTEMS:  Pertinent items are noted in HPI.     ECOG PERFORMANCE STATUS: 1 - Symptomatic but completely ambulatory  Blood pressure 121/61, pulse 69, temperature 98.1 F (36.7 C), height 5\' 9"  (1.753 m), weight 169 lb 3.2 oz (76.749 kg).  LABORATORY DATA: Lab Results  Component Value Date  WBC 56.8* 05/08/2011   HGB 12.7* 05/08/2011   HCT 38.8 05/08/2011   MCV 104.8* 05/08/2011   PLT 78* 05/08/2011      RADIOGRAPHIC STUDIES:  No results found.  ASSESSMENT: 78 year old gentleman with stage I chronic lymphocytic leukemia.patient overall is doing well. He overall remains pretty stable. At this time we'll continue to follow him PLAN: I have recommended followup in about one month's time. At that time we will do a CBC if his platelets dropped further or his white count goes up further then we will go ahead and do a bone marrow biopsy and aspirate. All questions were answered today.  All questions were answered. The patient knows to call the clinic with any problems, questions or concerns. We can  certainly see the patient much sooner if necessary.  I spent 20 minutes counseling the patient face to face. The total time spent in the appointment was 30 minutes.    Drue Second, MD Medical/Oncology [facility] [phone] (beeper) [phone] (Office)  05/09/2011, 8:52 AM

## 2011-05-28 ENCOUNTER — Ambulatory Visit (INDEPENDENT_AMBULATORY_CARE_PROVIDER_SITE_OTHER): Payer: Medicare Other | Admitting: Family Medicine

## 2011-05-28 ENCOUNTER — Encounter: Payer: Self-pay | Admitting: Family Medicine

## 2011-05-28 DIAGNOSIS — I10 Essential (primary) hypertension: Secondary | ICD-10-CM

## 2011-05-28 DIAGNOSIS — A6000 Herpesviral infection of urogenital system, unspecified: Secondary | ICD-10-CM | POA: Diagnosis not present

## 2011-05-28 MED ORDER — VALACYCLOVIR HCL 1 G PO TABS: 1000.0000 mg | ORAL_TABLET | Freq: Two times a day (BID) | ORAL | Status: DC

## 2011-05-28 MED ORDER — VALACYCLOVIR HCL 1 G PO TABS: 500.0000 mg | ORAL_TABLET | Freq: Two times a day (BID) | ORAL | Status: DC

## 2011-05-28 NOTE — Progress Notes (Signed)
  Subjective:    [name], male    DOB: November 26, 1928, 78 y.o.   MRN: [id number]  HPI CC: transfer of care from Dr. Hetty Ely  CLL - sees onc monthly.  Dr. Park Breed.  HTN - felt R shoulder pain on Saturday, took BP and 170/80s.  Pain stayed in shoulder joint, described as "background pain" ache.  Compliant with lisinopril.  Takes daily.  No HA, vision changes, CP/tightness, SOB, leg swelling.  Blood pressure usually 120/60s.  Has cuff at home.    No recent fevers/chills, abd pain.  Requests increased dose in valtrex.  Takes for genital herpes.  No outbreak in years.  Takes valtrex daily 500mg  bid for years, prior on 1000mg  bid.  Noticing some sensitivity for last week when washing genital area.  Has been using pump for ED.  Started using pump several months ago.  Medications and allergies reviewed and updated in chart. PMH reviewed as well. Past Medical History  Diagnosis Date  . CAD (coronary artery disease)     S/P CAB x 4 1/03; Myoview 08 EF 63% normal perfusion  . GERD (gastroesophageal reflux disease) 1990s  . Hyperlipemia 2002  . Hypertension 2002  . CLL (chronic lymphoblastic leukemia)     Dr. Welton Flakes  . Thrombocytopenia     Dr. Park Breed  . History of herpes genitalis     valtrex daily     Review of Systems Per HPI    Objective:   Physical Exam  Nursing note and vitals reviewed. Constitutional: He appears well-developed and well-nourished. No distress.  HENT:  Head: Normocephalic and atraumatic.  Mouth/Throat: Oropharynx is clear and moist. No oropharyngeal exudate.  Eyes: Conjunctivae and EOM are normal. Pupils are equal, round, and reactive to light. No scleral icterus.  Neck: Normal range of motion. Neck supple. Carotid bruit is present (Right).  Cardiovascular: Normal rate, regular rhythm and intact distal pulses.   Murmur (3/6 SEM best at LUSB) heard. Pulmonary/Chest: Breath sounds normal. No respiratory distress. He has no wheezes. He has no rales.    Abdominal: Hernia confirmed negative in the right inguinal area and confirmed negative in the left inguinal area.  Genitourinary: Testes normal. Uncircumcised. Penile erythema present. No phimosis, hypospadias or penile tenderness.       Slight erythema right shaft  Skin: Skin is warm and dry. No rash noted.       Assessment & Plan:

## 2011-05-28 NOTE — Assessment & Plan Note (Signed)
Chronic. No changes today.  H/o hypotension in past. Recommend he keep track regularly for next 1-2 wks and call me with update on blood pressures, if staying elevated will change BP meds accordingly. Neg ROS today.

## 2011-05-28 NOTE — Assessment & Plan Note (Signed)
Prior on 500mg  valtrex bid.  States feels flare coming with this - feels same as prior herpes flares. Will increase valtrex to 1000mg  bid for 10 days then drop back down to 500mg  bid. H/o CLL.

## 2011-05-28 NOTE — Patient Instructions (Addendum)
Good to see you today, call us with questions. Keep track of blood pressures over next 1-2 weeks and call me with an update.  If staying elevated, we will change blood pressure medications. Return as needed or in 4-6 months for follow up. Increase valtrex to 1000mg  twice daily for 10 days then back down to 500mg  twice daily.

## 2011-06-03 ENCOUNTER — Other Ambulatory Visit: Payer: Self-pay | Admitting: *Deleted

## 2011-06-03 MED ORDER — VALACYCLOVIR HCL 1 G PO TABS: 500.0000 mg | ORAL_TABLET | Freq: Every day | ORAL | Status: DC

## 2011-06-03 NOTE — Telephone Encounter (Signed)
Patient called to request Rx for 90 day supply of Valtrex.  Patient stated that he takes 1000mg  daily instead of twice daily.  Medication updated in Epic and Rx sent to [facility].

## 2011-06-06 ENCOUNTER — Other Ambulatory Visit (HOSPITAL_BASED_OUTPATIENT_CLINIC_OR_DEPARTMENT_OTHER): Payer: Medicare Other | Admitting: Lab

## 2011-06-06 DIAGNOSIS — C9110 Chronic lymphocytic leukemia of B-cell type not having achieved remission: Secondary | ICD-10-CM

## 2011-06-06 LAB — CBC WITH DIFFERENTIAL/PLATELET
HCT: 37.6 % — ABNORMAL LOW (ref 38.4–49.9)
HGB: 12.4 g/dL — ABNORMAL LOW (ref 13.0–17.1)
MCH: 34.5 pg — ABNORMAL HIGH (ref 27.2–33.4)
MCHC: 32.9 g/dL (ref 32.0–36.0)
MCV: 104.7 fL — ABNORMAL HIGH (ref 79.3–98.0)
Platelets: 87 10*3/uL — ABNORMAL LOW (ref 140–400)
RBC: 3.59 10*6/uL — ABNORMAL LOW (ref 4.20–5.82)
RDW: 15 % — ABNORMAL HIGH (ref 11.0–14.6)
WBC: 53.9 10*3/uL (ref 4.0–10.3)

## 2011-06-06 LAB — MANUAL DIFFERENTIAL
ALC: 49.6 10*3/uL — ABNORMAL HIGH (ref 0.9–3.3)
ANC (CHCC manual diff): 3.8 10*3/uL (ref 1.5–6.5)
Band Neutrophils: 0 % (ref 0–10)
Basophil: 0 % (ref 0–2)
Blasts: 0 % (ref 0–0)
EOS: 0 % (ref 0–7)
LYMPH: 92 % — ABNORMAL HIGH (ref 14–49)
MONO: 1 % (ref 0–14)
Metamyelocytes: 1 % — ABNORMAL HIGH (ref 0–0)
Myelocytes: 0 % (ref 0–0)
Other Cell: 0 % (ref 0–0)
PLT EST: DECREASED
PROMYELO: 0 % (ref 0–0)
SEG: 6 % — ABNORMAL LOW (ref 38–77)
Variant Lymph: 0 % (ref 0–0)
nRBC: 0 % (ref 0–0)

## 2011-06-13 ENCOUNTER — Telehealth: Payer: Self-pay | Admitting: *Deleted

## 2011-06-13 NOTE — Telephone Encounter (Signed)
Per MD, Notified pt counts are stable, f/u with MD as scheduled on 04/19 Pt confirmed date/time of next appt

## 2011-06-13 NOTE — Telephone Encounter (Signed)
Message copied by Cooper Render on Thu Jun 13, 2011  2:32 PM ------      Message from: Victorino December      Created: Thu Jun 13, 2011  2:18 PM       Call patient: counts stable, keep appt with Dr. Welton Flakes

## 2011-06-24 ENCOUNTER — Telehealth: Payer: Self-pay

## 2011-06-24 NOTE — Telephone Encounter (Signed)
Pt walked in and concerned about BP fluctuating. Pt said 3 wks ago BP 175/80, last week BP 124/58 and yesterday BP 155/72. Pt wants med to regulate BP. Pt saw Dr Sharen Hones on 05/28/11 and BP was 144/80. Pt taking lisinopril 20 mg one daily. Pt said had headache last night but none today. Pt said SOB had started today. Pt had SOB and went to [employer] this AM around 8AM for 30 min cardio workout. Pt said still some SOB but no h/a, chest pain, fever or N&V. Pt said had constant dry cough for 3-4 years. No available appts today. Dr Dayton Martes said could go to [facility]. Pt said if condition worsened he would go to [facility] and made appt to see Dr Sharen Hones on Wed 06/26/11.

## 2011-06-26 ENCOUNTER — Ambulatory Visit (INDEPENDENT_AMBULATORY_CARE_PROVIDER_SITE_OTHER): Payer: Medicare Other | Admitting: Family Medicine

## 2011-06-26 ENCOUNTER — Encounter: Payer: Self-pay | Admitting: Family Medicine

## 2011-06-26 VITALS — BP 116/64 | HR 68 | Temp 98.1°F | Wt 172.8 lb

## 2011-06-26 DIAGNOSIS — R0609 Other forms of dyspnea: Secondary | ICD-10-CM

## 2011-06-26 DIAGNOSIS — R0600 Dyspnea, unspecified: Secondary | ICD-10-CM | POA: Insufficient documentation

## 2011-06-26 DIAGNOSIS — R0989 Other specified symptoms and signs involving the circulatory and respiratory systems: Secondary | ICD-10-CM

## 2011-06-26 NOTE — Patient Instructions (Signed)
Pass by Marion's office to schedule ultrasound of heart. Depending on results, we may start you on another blood pressure medicine or may send you back to Dr. Mariah Milling for sooner appointment. We will give you a call after heart ultrasound returns. Good to see you today, call us with questions.

## 2011-06-26 NOTE — Progress Notes (Signed)
Subjective:    [name], male    DOB: 1928-10-23, 78 y.o.   MRN: [id number]  HPI CC: check blood pressure  H/o CAD s/p 4v CABG 2003, Myoview (2008) normal, with EF 63% and normal perfusion  H/o CLL - sees onc monthly (Dr. Welton Flakes).  F/u appt with Dr. Welton Flakes is next month.  Overall stable from CLL perspective.  Seen here last month with concern for labile BPs, advised to keep log of bp.  Brings log over last 2 days - ranging from 134-173/59-83.  States longer log also similar (kept track on his calendar).  Also noticing SOB over the last month.  Works out at gym 5-6x/wk in am, finds has difficulty finishing routine.  Dyspnea occurs with exercise and sometimes mild dyspnea noted at home when walking to mailbox.  Denies weight gain or leg swelling.  Sunday had background headache until went to sleep Sunday night.  Denies chest pain or tightness.  No dizziness or lightheadedness.  Denies orthopnea or PNdyspnea.  No current SOB.  Last saw cards 03/2011 (gollan).  F/u appt scheduled for June 2013.  TTE (10/09): EF 55-65%, mild diastolic dysfunction, no significant valvular abnormality Carotid u/s 2012: RICA 60-79% LICA 0-39% (stable), rec rpt 1 yr.  Lab Results  Component Value Date   CREATININE 1.55* 05/08/2011    Wt Readings from Last 3 Encounters:  06/26/11 172 lb 12 oz (78.359 kg)  05/28/11 171 lb 12 oz (77.905 kg)  05/08/11 169 lb 3.2 oz (76.749 kg)   Medications and allergies reviewed and updated in chart.  Past histories reviewed and updated if relevant as below. Patient Active Problem List  Diagnoses  . GENITAL HERPES  . HYPERLIPIDEMIA  . UNSPECIFIED SUBJECTIVE VISUAL DISTURBANCE  . HYPERTENSION  . CORONARY ARTERY DISEASE  . CAROTID STENOSIS  . HYPOTENSION, UNSPECIFIED  . GERD  . PROCTITIS  . DERMATITIS, SEBORRHEIC NOS  . BACK PAIN, LUMBAR  . CAROTID BRUIT, RIGHT  . HYPERGLYCEMIA  . RENAL CALCULUS, HX OF  . Erectile dysfunction  . Chronic lymphocytic  leukemia, Rai stage I   Past Medical History  Diagnosis Date  . CAD (coronary artery disease)     S/P CAB x 4 1/03; Myoview 08 EF 63% normal perfusion  . GERD (gastroesophageal reflux disease) 1990s  . Hyperlipemia 2002  . Hypertension 2002  . CLL (chronic lymphoblastic leukemia)     Dr. Welton Flakes  . Thrombocytopenia     Dr. Park Breed  . History of herpes genitalis     valtrex daily  . Carotid stenosis     R: 60-79%, stable (last [place] 09/2010)   Past Surgical History  Procedure Date  . Hepatic artery angioplasty [address] [place]  . Hemorroidectomy 1954    Fissure repair, [place]  . Kidney stone surgery 1977    Dr Jenne Pane  . Lithotripsy 1990s    Multiple  . Angioplasty 1998  . Coronary angioplasty 1998  . Coronary artery bypass graft 04/23/2001    x4, Dr. Nydia Bouton  . Esophagogastroduodenoscopy 11/29/1987    Gastritis  . Colonoscopy 11/29/1987    Normal  . Colonoscopy 02/07/2003    Hemm. Internal, focal proctitis, negative biopsy  . Colonoscopy 12/12/2004    Internal external hemorrhoids, +proctits, negative biopsy  . Cataract extraction, bilateral     R 1/09, L 8/09  . Ett 12/10/2006    Persantine myoview nml   History  Substance Use Topics  . Smoking status: Former Smoker --  2.0 packs/day for 30 years    Quit date: 02/28/1979  . Smokeless tobacco: Never Used   Comment: quit 1980  . Alcohol Use: 0.0 oz/week    0 drink(s) per week     occasional wine   Family History  Problem Relation Age of Onset  . Hypertension Mother   . Heart disease Mother   . Hyperlipidemia Mother   . Hypertension Father   . Hyperlipidemia Father   . Kidney cancer Sister     Renal cell cancer  . Alcohol abuse Brother   . Diabetes Brother   . Stroke Brother   . Heart attack Brother     MI  . Diabetes Other     Sister's daughter  . Depression Daughter     Bipolar   Allergies  Allergen Reactions  . Band-Aid Liquid Bandage    Current Outpatient Prescriptions on File Prior to Visit    Medication Sig Dispense Refill  . aspirin 81 MG EC tablet Take 162 mg by mouth daily.        . fish oil-omega-3 fatty acids 1000 MG capsule Take 3 capsules by mouth daily.        . Glycerin, Laxative, ([facility] SUPPOSITORY, ADULT, RE) Place 1 Units rectally as needed.        . hydrocortisone (ANUSOL-HC) 25 MG suppository Place 1 suppository (25 mg total) rectally 2 (two) times daily as needed.  12 suppository  3  . lisinopril (PRINIVIL,ZESTRIL) 20 MG tablet Take 1 tablet (20 mg total) by mouth daily.  90 tablet  3  . simvastatin (ZOCOR) 20 MG tablet Take 1 tablet (20 mg total) by mouth at bedtime.  90 tablet  3  . valACYclovir (VALTREX) 1000 MG tablet Take 0.5 tablets (500 mg total) by mouth daily.  90 tablet  3     Review of Systems Per HPI    Objective:   Physical Exam  Nursing note and vitals reviewed. Constitutional: He appears well-developed and well-nourished. No distress.  HENT:  Head: Normocephalic and atraumatic.  Mouth/Throat: Oropharynx is clear and moist. No oropharyngeal exudate.  Eyes: Conjunctivae and EOM are normal. Pupils are equal, round, and reactive to light. No scleral icterus.  Neck: Normal range of motion. Neck supple. No JVD present. Carotid bruit is present (R>L).  Cardiovascular: Normal rate, regular rhythm and intact distal pulses.   Murmur (3/6 SEM best at LUSB) heard. Pulmonary/Chest: Effort normal and breath sounds normal. No respiratory distress. He has no wheezes. He has no rales.       No crackles  Musculoskeletal: He exhibits no edema.  Lymphadenopathy:    He has no cervical adenopathy.  Skin: Skin is warm and dry. No rash noted.  Psychiatric: He has a normal mood and affect.       Assessment & Plan:

## 2011-06-26 NOTE — Assessment & Plan Note (Addendum)
1 mo h/o worsening DOE in h/o CAD, mild diastolic CHF, CLL, CRI, recently elevated BPs. Today BP great control. Cell lines have been stable last few checks so doubt CLL contributing. ?worsening CHF - start with checking echo as last done 2009, eval continued murmur heard, eval for worsening CHF. If w/u unrevealing, may recommend referral back to cards for further eval. Consider adding amlodipine for better bp control, await echo results first.

## 2011-07-01 HISTORY — PX: US ECHOCARDIOGRAPHY: HXRAD669

## 2011-07-16 ENCOUNTER — Other Ambulatory Visit (INDEPENDENT_AMBULATORY_CARE_PROVIDER_SITE_OTHER): Payer: Medicare Other

## 2011-07-16 ENCOUNTER — Other Ambulatory Visit: Payer: Self-pay

## 2011-07-16 DIAGNOSIS — R0602 Shortness of breath: Secondary | ICD-10-CM | POA: Diagnosis not present

## 2011-07-16 DIAGNOSIS — I2510 Atherosclerotic heart disease of native coronary artery without angina pectoris: Secondary | ICD-10-CM | POA: Diagnosis not present

## 2011-07-16 DIAGNOSIS — R0600 Dyspnea, unspecified: Secondary | ICD-10-CM

## 2011-07-18 ENCOUNTER — Encounter: Payer: Self-pay | Admitting: Family Medicine

## 2011-07-19 ENCOUNTER — Other Ambulatory Visit: Payer: Self-pay | Admitting: Family Medicine

## 2011-07-19 ENCOUNTER — Encounter: Payer: Self-pay | Admitting: Family Medicine

## 2011-07-19 ENCOUNTER — Telehealth: Payer: Self-pay | Admitting: Oncology

## 2011-07-19 ENCOUNTER — Ambulatory Visit (HOSPITAL_BASED_OUTPATIENT_CLINIC_OR_DEPARTMENT_OTHER): Payer: Medicare Other | Admitting: Oncology

## 2011-07-19 ENCOUNTER — Other Ambulatory Visit (HOSPITAL_BASED_OUTPATIENT_CLINIC_OR_DEPARTMENT_OTHER): Payer: Medicare Other | Admitting: Lab

## 2011-07-19 VITALS — BP 104/59 | HR 69 | Temp 98.3°F | Ht 69.0 in | Wt 164.4 lb

## 2011-07-19 DIAGNOSIS — C9110 Chronic lymphocytic leukemia of B-cell type not having achieved remission: Secondary | ICD-10-CM | POA: Diagnosis not present

## 2011-07-19 DIAGNOSIS — R0609 Other forms of dyspnea: Secondary | ICD-10-CM | POA: Diagnosis not present

## 2011-07-19 DIAGNOSIS — R0989 Other specified symptoms and signs involving the circulatory and respiratory systems: Secondary | ICD-10-CM | POA: Diagnosis not present

## 2011-07-19 DIAGNOSIS — K5900 Constipation, unspecified: Secondary | ICD-10-CM

## 2011-07-19 DIAGNOSIS — R0600 Dyspnea, unspecified: Secondary | ICD-10-CM

## 2011-07-19 LAB — CBC WITH DIFFERENTIAL/PLATELET
BASO%: 0.2 % (ref 0.0–2.0)
Basophils Absolute: 0.1 10*3/uL (ref 0.0–0.1)
EOS%: 0.7 % (ref 0.0–7.0)
Eosinophils Absolute: 0.4 10*3/uL (ref 0.0–0.5)
HCT: 37.2 % — ABNORMAL LOW (ref 38.4–49.9)
HGB: 12.2 g/dL — ABNORMAL LOW (ref 13.0–17.1)
LYMPH%: 87.9 % — ABNORMAL HIGH (ref 14.0–49.0)
MCH: 34.3 pg — ABNORMAL HIGH (ref 27.2–33.4)
MCHC: 32.7 g/dL (ref 32.0–36.0)
MCV: 105 fL — ABNORMAL HIGH (ref 79.3–98.0)
MONO#: 1.4 10*3/uL — ABNORMAL HIGH (ref 0.1–0.9)
MONO%: 2.6 % (ref 0.0–14.0)
NEUT#: 4.6 10*3/uL (ref 1.5–6.5)
NEUT%: 8.6 % — ABNORMAL LOW (ref 39.0–75.0)
Platelets: 84 10*3/uL — ABNORMAL LOW (ref 140–400)
RBC: 3.55 10*6/uL — ABNORMAL LOW (ref 4.20–5.82)
RDW: 14.5 % (ref 11.0–14.6)
WBC: 53.6 10*3/uL (ref 4.0–10.3)
lymph#: 47.2 10*3/uL — ABNORMAL HIGH (ref 0.9–3.3)
nRBC: 0 % (ref 0–0)

## 2011-07-19 LAB — LACTATE DEHYDROGENASE: LDH: 201 U/L (ref 94–250)

## 2011-07-19 LAB — IGG, IGA, IGM
IgA: 116 mg/dL (ref 68–379)
IgG (Immunoglobin G), Serum: 998 mg/dL (ref 650–1600)
IgM, Serum: 79 mg/dL (ref 41–251)

## 2011-07-19 LAB — COMPREHENSIVE METABOLIC PANEL
ALT: 19 U/L (ref 0–53)
AST: 30 U/L (ref 0–37)
Albumin: 4.5 g/dL (ref 3.5–5.2)
Alkaline Phosphatase: 113 U/L (ref 39–117)
BUN: 22 mg/dL (ref 6–23)
CO2: 25 mEq/L (ref 19–32)
Calcium: 9.2 mg/dL (ref 8.4–10.5)
Chloride: 105 mEq/L (ref 96–112)
Creatinine, Ser: 1.51 mg/dL — ABNORMAL HIGH (ref 0.50–1.35)
Glucose, Bld: 127 mg/dL — ABNORMAL HIGH (ref 70–99)
Potassium: 4.7 mEq/L (ref 3.5–5.3)
Sodium: 138 mEq/L (ref 135–145)
Total Bilirubin: 0.5 mg/dL (ref 0.3–1.2)
Total Protein: 6.6 g/dL (ref 6.0–8.3)

## 2011-07-19 LAB — TECHNOLOGIST REVIEW

## 2011-07-19 LAB — BRAIN NATRIURETIC PEPTIDE: Brain Natriuretic Peptide: 82.8 pg/mL (ref 0.0–100.0)

## 2011-07-19 LAB — HAPTOGLOBIN: Haptoglobin: 99 mg/dL (ref 45–215)

## 2011-07-19 MED ORDER — FUROSEMIDE 20 MG PO TABS: 20.0000 mg | ORAL_TABLET | Freq: Every day | ORAL | Status: DC

## 2011-07-19 NOTE — Patient Instructions (Signed)
1. Your counts remain stable.  2. We will continue monitoring them on a monthly basis  3. Use miralax twice a day for constipation. Senna 2 tablets daily  4. I will see you back in 3 months

## 2011-07-19 NOTE — Progress Notes (Signed)
OFFICE PROGRESS NOTE  CC:  Eustaquio Boyden, MD, MD [address] [place] [address]  DIAGNOSIS: 78 year old gentleman with stage I chronic lymphocytic leukemia diagnosed 4 years ago  PRIOR THERAPY: Observation  CURRENT THERAPY: Observation  INTERVAL HISTORY: Jimmy [name] 78 y.o. male returns for a followup visit today. He has been having CBCs performed on a regular basis. Overall he has done well. His white count is slightly up but he remains quite asymptomatic. His platelets are 84,000. He himself denies any fevers chills night sweats headaches shortness of breath chest pains palpitations he has no myalgias or arthralgias. He has not noticed any easy bruising or bleeding. He has not had any recent hospitalizations for any infections. Remainder of the 10 point review of systems is negative  MEDICAL HISTORY: Past Medical History  Diagnosis Date  . CAD (coronary artery disease)     S/P CAB x 4 1/03; Myoview 08 EF 63% normal perfusion  . GERD (gastroesophageal reflux disease) 1990s  . Hyperlipemia 2002  . Hypertension 2002  . CLL (chronic lymphoblastic leukemia)     Dr. Welton Flakes  . Thrombocytopenia     Dr. Park Breed  . History of herpes genitalis     valtrex daily  . Carotid stenosis     R: 60-79%, stable (last [place] 09/2010)  . Diastolic CHF 2013    grade 1  . Aortic stenosis 2013    mild    ALLERGIES:  is allergic to band-aid liquid bandage.  MEDICATIONS:  Current Outpatient Prescriptions  Medication Sig Dispense Refill  . aspirin 81 MG EC tablet Take 162 mg by mouth daily.        . fish oil-omega-3 fatty acids 1000 MG capsule Take 3 capsules by mouth daily.        . furosemide (LASIX) 20 MG tablet Take 1 tablet (20 mg total) by mouth daily.  15 tablet  0  . Glycerin, Laxative, ([facility], ADULT, RE) Place 1 Units rectally as needed.        . hydrocortisone (ANUSOL-HC) 25 MG suppository Place 1 suppository (25 mg total) rectally 2 (two) times daily as needed.   12 suppository  3  . lisinopril (PRINIVIL,ZESTRIL) 20 MG tablet Take 1 tablet (20 mg total) by mouth daily.  90 tablet  3  . psyllium (METAMUCIL) 58.6 % powder Take 1 packet by mouth 2 (two) times daily.      . simvastatin (ZOCOR) 20 MG tablet Take 1 tablet (20 mg total) by mouth at bedtime.  90 tablet  3  . valACYclovir (VALTREX) 1000 MG tablet Take 0.5 tablets (500 mg total) by mouth 2 (two) times daily.  90 tablet  3    SURGICAL HISTORY:  Past Surgical History  Procedure Date  . Hepatic artery angioplasty [address] [place]  . Hemorroidectomy 1954    Fissure repair, [place]  . Kidney stone surgery 1977    Dr Jenne Pane  . Lithotripsy 1990s    Multiple  . Angioplasty 1998  . Coronary angioplasty 1998  . Coronary artery bypass graft 04/23/2001    x4, Dr. Nydia Bouton  . Esophagogastroduodenoscopy 11/29/1987    Gastritis  . Colonoscopy 11/29/1987    Normal  . Colonoscopy 02/07/2003    Hemm. Internal, focal proctitis, negative biopsy  . Colonoscopy 12/12/2004    Internal external hemorrhoids, +proctits, negative biopsy  . Cataract extraction, bilateral     R 1/09, L 8/09  . Ett 12/10/2006    Persantine  myoview nml  . US echocardiography 07/2011    nl sys fxn, EF 55-60%, grade 1 diast dysfunction, mild AS, mildly elevated PA pressure    REVIEW OF SYSTEMS:  Pertinent items are noted in HPI.     ECOG PERFORMANCE STATUS: 1 - Symptomatic but completely ambulatory  Blood pressure 104/59, pulse 69, temperature 98.3 F (36.8 C), height 5\' 9"  (1.753 m), weight 164 lb 6.4 oz (74.571 kg).  LABORATORY DATA: Lab Results  Component Value Date   WBC 53.6* 07/19/2011   HGB 12.2* 07/19/2011   HCT 37.2* 07/19/2011   MCV 105.0* 07/19/2011   PLT 84* 07/19/2011      RADIOGRAPHIC STUDIES:  No results found.  ASSESSMENT: 78 year old gentleman with stage I chronic lymphocytic leukemia.patient overall is doing well. He overall remains pretty stable. At this time we'll continue to follow  him  PLAN: Patient's counts remain relatively stable and clinically he is asymptomatic from his CLL. I will continue to follow him every 3 months. In the meantime he will continue to have a CBC done on a monthly basis to see if there is doubling of his white count and lymphocytosis. Patient does have constipation and I have recommended that he use MiraLAX and Senokot to see if that helps relieve his constipation.  All questions were answered. The patient knows to call the clinic with any problems, questions or concerns. We can certainly see the patient much sooner if necessary.  I spent 20 minutes counseling the patient face to face. The total time spent in the appointment was 30 minutes.    Drue Second, MD Medical/Oncology [facility] [phone] (beeper) [phone] (Office)  07/19/2011, 11:31 AM

## 2011-07-19 NOTE — Telephone Encounter (Signed)
gve the pt his may-July 2013 appts

## 2011-08-06 ENCOUNTER — Other Ambulatory Visit: Payer: Self-pay

## 2011-08-06 MED ORDER — VALACYCLOVIR HCL 1 G PO TABS: 500.0000 mg | ORAL_TABLET | Freq: Two times a day (BID) | ORAL | Status: DC

## 2011-08-06 NOTE — Telephone Encounter (Signed)
Pt has been advised.

## 2011-08-06 NOTE — Telephone Encounter (Signed)
Pt said when received [facility]cl[facility] changed to 1/2 tab daily. Pt said has always taken 1/2 tab by mouth twice a day. Med list has 1/2 tab twice a day. On 06/03/11 phone note it appears CMA understood pt taking 1/2 daily. Pt [facility] with Val[facility]clovir 1000 mg taking 1/2 tab twice a day. Pt can be reached at [phone] or [phone].

## 2011-08-06 NOTE — Telephone Encounter (Signed)
Sent in new bid dosing.  plz notify pt.

## 2011-08-09 NOTE — Telephone Encounter (Signed)
Pt saw Dr Sharen Hones on 06/26/11.

## 2011-08-19 ENCOUNTER — Other Ambulatory Visit (HOSPITAL_BASED_OUTPATIENT_CLINIC_OR_DEPARTMENT_OTHER): Payer: Medicare Other | Admitting: Lab

## 2011-08-19 DIAGNOSIS — C9110 Chronic lymphocytic leukemia of B-cell type not having achieved remission: Secondary | ICD-10-CM

## 2011-08-19 LAB — CBC WITH DIFFERENTIAL/PLATELET
BASO%: 0.1 % (ref 0.0–2.0)
Basophils Absolute: 0 10*3/uL (ref 0.0–0.1)
EOS%: 0.7 % (ref 0.0–7.0)
Eosinophils Absolute: 0.4 10*3/uL (ref 0.0–0.5)
HCT: 36.6 % — ABNORMAL LOW (ref 38.4–49.9)
HGB: 11.8 g/dL — ABNORMAL LOW (ref 13.0–17.1)
LYMPH%: 87.1 % — ABNORMAL HIGH (ref 14.0–49.0)
MCH: 34.1 pg — ABNORMAL HIGH (ref 27.2–33.4)
MCHC: 32.3 g/dL (ref 32.0–36.0)
MCV: 105.7 fL — ABNORMAL HIGH (ref 79.3–98.0)
MONO#: 1.7 10*3/uL — ABNORMAL HIGH (ref 0.1–0.9)
MONO%: 3.1 % (ref 0.0–14.0)
NEUT#: 4.8 10*3/uL (ref 1.5–6.5)
NEUT%: 9 % — ABNORMAL LOW (ref 39.0–75.0)
Platelets: 88 10*3/uL — ABNORMAL LOW (ref 140–400)
RBC: 3.46 10*6/uL — ABNORMAL LOW (ref 4.20–5.82)
RDW: 14.8 % — ABNORMAL HIGH (ref 11.0–14.6)
WBC: 53.3 10*3/uL (ref 4.0–10.3)
lymph#: 46.4 10*3/uL — ABNORMAL HIGH (ref 0.9–3.3)
nRBC: 0 % (ref 0–0)

## 2011-08-19 LAB — TECHNOLOGIST REVIEW

## 2011-09-16 ENCOUNTER — Other Ambulatory Visit (HOSPITAL_BASED_OUTPATIENT_CLINIC_OR_DEPARTMENT_OTHER): Payer: Medicare Other | Admitting: Lab

## 2011-09-16 ENCOUNTER — Telehealth: Payer: Self-pay | Admitting: *Deleted

## 2011-09-16 DIAGNOSIS — C9110 Chronic lymphocytic leukemia of B-cell type not having achieved remission: Secondary | ICD-10-CM | POA: Diagnosis not present

## 2011-09-16 LAB — CBC WITH DIFFERENTIAL/PLATELET
BASO%: 0.2 % (ref 0.0–2.0)
Basophils Absolute: 0.1 10*3/uL (ref 0.0–0.1)
EOS%: 0.8 % (ref 0.0–7.0)
Eosinophils Absolute: 0.4 10*3/uL (ref 0.0–0.5)
HCT: 37.4 % — ABNORMAL LOW (ref 38.4–49.9)
HGB: 12.2 g/dL — ABNORMAL LOW (ref 13.0–17.1)
LYMPH%: 88.1 % — ABNORMAL HIGH (ref 14.0–49.0)
MCH: 34.7 pg — ABNORMAL HIGH (ref 27.2–33.4)
MCHC: 32.7 g/dL (ref 32.0–36.0)
MCV: 106.2 fL — ABNORMAL HIGH (ref 79.3–98.0)
MONO#: 1.4 10*3/uL — ABNORMAL HIGH (ref 0.1–0.9)
MONO%: 2.6 % (ref 0.0–14.0)
NEUT#: 4.3 10*3/uL (ref 1.5–6.5)
NEUT%: 8.3 % — ABNORMAL LOW (ref 39.0–75.0)
Platelets: 83 10*3/uL — ABNORMAL LOW (ref 140–400)
RBC: 3.52 10*6/uL — ABNORMAL LOW (ref 4.20–5.82)
RDW: 15.1 % — ABNORMAL HIGH (ref 11.0–14.6)
WBC: 51.9 10*3/uL (ref 4.0–10.3)
lymph#: 45.7 10*3/uL — ABNORMAL HIGH (ref 0.9–3.3)

## 2011-09-16 NOTE — Telephone Encounter (Signed)
Per MD, notified pt labs are relatively stable. Pt verbalized understanding, confirmed he would see [place] next month. Denied needing further assistance.

## 2011-10-14 ENCOUNTER — Ambulatory Visit (HOSPITAL_BASED_OUTPATIENT_CLINIC_OR_DEPARTMENT_OTHER): Payer: Medicare Other | Admitting: Oncology

## 2011-10-14 ENCOUNTER — Other Ambulatory Visit: Payer: Medicare Other | Admitting: Lab

## 2011-10-14 ENCOUNTER — Telehealth: Payer: Self-pay | Admitting: Oncology

## 2011-10-14 ENCOUNTER — Encounter: Payer: Self-pay | Admitting: Oncology

## 2011-10-14 VITALS — BP 117/64 | HR 64 | Temp 98.7°F | Ht 69.0 in | Wt 164.4 lb

## 2011-10-14 DIAGNOSIS — C9110 Chronic lymphocytic leukemia of B-cell type not having achieved remission: Secondary | ICD-10-CM

## 2011-10-14 LAB — CBC WITH DIFFERENTIAL/PLATELET
BASO%: 0.3 % (ref 0.0–2.0)
Basophils Absolute: 0.2 10*3/uL — ABNORMAL HIGH (ref 0.0–0.1)
EOS%: 0.4 % (ref 0.0–7.0)
Eosinophils Absolute: 0.2 10*3/uL (ref 0.0–0.5)
HCT: 37.5 % — ABNORMAL LOW (ref 38.4–49.9)
HGB: 12.2 g/dL — ABNORMAL LOW (ref 13.0–17.1)
LYMPH%: 86.7 % — ABNORMAL HIGH (ref 14.0–49.0)
MCH: 34.6 pg — ABNORMAL HIGH (ref 27.2–33.4)
MCHC: 32.6 g/dL (ref 32.0–36.0)
MCV: 106.2 fL — ABNORMAL HIGH (ref 79.3–98.0)
MONO#: 1.9 10*3/uL — ABNORMAL HIGH (ref 0.1–0.9)
MONO%: 3.9 % (ref 0.0–14.0)
NEUT#: 4.3 10*3/uL (ref 1.5–6.5)
NEUT%: 8.7 % — ABNORMAL LOW (ref 39.0–75.0)
Platelets: 83 10*3/uL — ABNORMAL LOW (ref 140–400)
RBC: 3.53 10*6/uL — ABNORMAL LOW (ref 4.20–5.82)
RDW: 14.4 % (ref 11.0–14.6)
WBC: 49.2 10*3/uL — ABNORMAL HIGH (ref 4.0–10.3)
lymph#: 42.7 10*3/uL — ABNORMAL HIGH (ref 0.9–3.3)

## 2011-10-14 LAB — TECHNOLOGIST REVIEW

## 2011-10-14 NOTE — Progress Notes (Signed)
OFFICE PROGRESS NOTE  CC:  Eustaquio Boyden, MD [address] [place] [address]  DIAGNOSIS: 78 year old gentleman with stage I chronic lymphocytic leukemia diagnosed 4 years ago  PRIOR THERAPY: Observation  CURRENT THERAPY: Observation  INTERVAL HISTORY: LINN GOETZE 78 y.o. male returns for a followup visit today. He has been having CBCs performed on a regular basis. Overall he has done well. They have been going up and down his white counts. He otherwise has not had any any symptoms of fevers chills pneumonia as sinus infections no evidence of hemolysis no easy bruising. He is noted to have some petechiae in his lower extremities. His platelet count is about 83,000 today. He has no peripheral paresthesias no headaches dizziness nausea or vomiting. No abdominal pain remainder of the 10 point review of systems is negative.  MEDICAL HISTORY: Past Medical History  Diagnosis Date  . CAD (coronary artery disease)     S/P CAB x 4 1/03; Myoview 08 EF 63% normal perfusion  . GERD (gastroesophageal reflux disease) 1990s  . Hyperlipemia 2002  . Hypertension 2002  . CLL (chronic lymphoblastic leukemia)     Dr. Welton Flakes  . Thrombocytopenia     Dr. Park Breed  . History of herpes genitalis     valtrex daily  . Carotid stenosis     R: 60-79%, stable (last [place] 09/2010)  . Diastolic CHF 2013    grade 1  . Aortic stenosis 2013    mild    ALLERGIES:  is allergic to new skin.  MEDICATIONS:  Current Outpatient Prescriptions  Medication Sig Dispense Refill  . aspirin 81 MG EC tablet Take 162 mg by mouth daily.        . fish oil-omega-3 fatty acids 1000 MG capsule Take 3 capsules by mouth daily.        . furosemide (LASIX) 20 MG tablet Take 1 tablet (20 mg total) by mouth daily.  15 tablet  0  . Glycerin, Laxative, ([facility], ADULT, RE) Place 1 Units rectally as needed.        . hydrocortisone (ANUSOL-HC) 25 MG suppository Place 1 suppository (25 mg total) rectally 2 (two)  times daily as needed.  12 suppository  3  . lisinopril (PRINIVIL,ZESTRIL) 20 MG tablet Take 1 tablet (20 mg total) by mouth daily.  90 tablet  3  . psyllium (METAMUCIL) 58.6 % powder Take 1 packet by mouth 2 (two) times daily.      . simvastatin (ZOCOR) 20 MG tablet Take 1 tablet (20 mg total) by mouth at bedtime.  90 tablet  3  . valACYclovir (VALTREX) 1000 MG tablet Take 0.5 tablets (500 mg total) by mouth 2 (two) times daily.  90 tablet  3    SURGICAL HISTORY:  Past Surgical History  Procedure Date  . Hepatic artery angioplasty [address] [place]  . Hemorroidectomy 1954    Fissure repair, [place]  . Kidney stone surgery 1977    Dr Jenne Pane  . Lithotripsy 1990s    Multiple  . Angioplasty 1998  . Coronary angioplasty 1998  . Coronary artery bypass graft 04/23/2001    x4, Dr. Nydia Bouton  . Esophagogastroduodenoscopy 11/29/1987    Gastritis  . Colonoscopy 11/29/1987    Normal  . Colonoscopy 02/07/2003    Hemm. Internal, focal proctitis, negative biopsy  . Colonoscopy 12/12/2004    Internal external hemorrhoids, +proctits, negative biopsy  . Cataract extraction, bilateral     R 1/09, L 8/09  .  Ett 12/10/2006    Persantine myoview nml  . US echocardiography 07/2011    nl sys fxn, EF 55-60%, grade 1 diast dysfunction, mild AS, mildly elevated PA pressure    REVIEW OF SYSTEMS:  Pertinent items are noted in HPI.     ECOG PERFORMANCE STATUS: 1 - Symptomatic but completely ambulatory  Blood pressure 117/64, pulse 64, temperature 98.7 F (37.1 C), temperature source Oral, height 5\' 9"  (1.753 m), weight 164 lb 6.4 oz (74.571 kg).  HEENT exam EOMI PERRLA sclerae anicteric no conjunctival pallor oral mucosa is moist no posterior pharyngeal wall erythema neck is supple there is palpable shotty lymph nodes in axilla and cervical regions. Lungs: Clear to auscultation and percussion cardiovascular: Regular rate rhythm no murmurs abdomen soft nontender nondistended bowel sounds are present  no hepatomegaly, active spleen is palpable below the left costal margin extremities: Petechiae noted otherwise no cyanosis clubbing or edema  LABORATORY DATA: Lab Results  Component Value Date   WBC 49.2* 10/14/2011   HGB 12.2* 10/14/2011   HCT 37.5* 10/14/2011   MCV 106.2* 10/14/2011   PLT 83* 10/14/2011      RADIOGRAPHIC STUDIES:  No results found.  ASSESSMENT: 78 year old gentleman with stage I chronic lymphocytic leukemia.patient overall is doing well. He overall remains pretty stable. At this time we'll continue to follow him  PLAN: #1 patient's CLL is stable there is no need for any kind of treatment at this time. He is doing very well with observation only.    #2 I will plan on seeing the patient back in 3 months time  All questions were answered. The patient knows to call the clinic with any problems, questions or concerns. We can certainly see the patient much sooner if necessary.  I spent 20 minutes counseling the patient face to face. The total time spent in the appointment was 30 minutes.    Drue Second, MD Medical/Oncology [facility] [phone] (beeper) [phone] (Office)  10/14/2011, 12:39 PM

## 2011-10-14 NOTE — Patient Instructions (Addendum)
1. Your CLL is stable.  2. I will see you back in 3 months but call if you experience any of the following as listed below:  Chronic Lymphocytic Leukemia Chronic lymphocytic leukemia (CLL) is a type of cancer of the bone marrow and blood cells. Bone marrow is the soft, spongy tissue inside your bone. In CLL, the bone marrow makes too many white blood cells that usually fight infection in the body (lymphocytes). CLL is the most common type of adult leukemia.  CAUSES  No one knows the exact cause of CLL. There is a higher risk of CLL in people who:   Are over 76 years of age.   Are white.   Are male.   Have a family history of CLL or other cancers of the lymph system.   Are of [place] or [place] European Jewish descent.   Have been exposed to certain chemicals, such as Agent Orange (used in the [place] War) or other herbicides or insecticides.  SYMPTOMS  At first, some people do not have symptoms of chronic lymphocytic leukemia. After a while, people may notice some symptoms, such as:   Feeling more tired than usual, even after rest.   Unplanned weight loss.   Heavy sweating at night.   Fevers.   Shortness of breath.   Decreased energy.   Paleness.   Painless, swollen lymph nodes.   A feeling of fullness in the upper left part of the abdomen.   Easy bruising and/or bleeding.   More frequent infections.  DIAGNOSIS   During a physical exam, your caregiver may notice an enlarged spleen, liver and/or lymph nodes.   Blood and bone marrow tests are performed to identify the presence of cancer cells.   A CT scan may be done to look for swelling or abnormalities in your spleen, liver, and lymph nodes.  TREATMENT  Treatment options for CLL depend on the stage and the presence of symptoms. There are a number of types of treatment used for this condition, including:  Targeted drugs. These are drugs that interfere with chemicals that leukemia cells need in order to grow  and multiply.   Chemotherapy drugs. These medications kill cells that are multiplying quickly, such as leukemia cells.   Biological therapy. This treatment boosts the ability of the patient's own immune system to fight the leukemia cells.   Bone marrow or peripheral blood stem cell transplant. This treatment allows the patient to receive very high doses of chemotherapy and/or radiation. These high doses kill the cancer cells, but also destroy the bone marrow. After treatment is complete, the patient is given donor bone marrow or stem cells, which will replace the bone marrow.  HOME CARE INSTRUCTIONS   Because you have an increased risk of infection, practice good hand washing and avoid being around people who are ill or crowded places.   Because you have an increased risk of bleeding and bruising, avoid contact sports or other rough activities.   Only take over-the-counter or prescription medicines for pain, discomfort or fever as directed by your caregiver.   Although some of your treatments might affect your appetite, try to eat regular, healthy meals.   If you develop any side effects, such as nausea, diarrhea, rash, white patches in your mouth, a sore throat, difficulty swallowing, or severe fatigue, tell your caregiver. He or she may have recommendations of things you can do to improve symptoms.   Consider learning some ways to cope with the stress of having  a chronic illness, such as yoga, meditation, or participating in a support group.  SEEK IMMEDIATE MEDICAL CARE IF:  You develop an unexplained oral temperature of 102 F (38.9 C) or more.   You develop chest pains.   You develop a severe stiff neck or headache.   You have trouble breathing or feel short of breath.   You feel very lightheaded or pass out.   You notice pain, swelling or redness anywhere in your legs.   You have pain in your belly (abdomen).   You develop new bruises that are getting bigger.   You have  painful or more swollen lymph nodes.   You develop bleeding from your gums, nose, or in your urine or stools.   You are unable to stop throwing up (vomiting).   You cannot keep liquids down.   You feel depressed.  Document Released: 08/04/2008 Document Revised: 03/07/2011 Document Reviewed: 08/04/2008 [facility] Patient Information 2012 [place], [place].

## 2011-10-14 NOTE — Telephone Encounter (Signed)
gve the pt his oct 2013 appt calendar °

## 2011-10-17 ENCOUNTER — Encounter (INDEPENDENT_AMBULATORY_CARE_PROVIDER_SITE_OTHER): Payer: Medicare Other

## 2011-10-17 DIAGNOSIS — R0989 Other specified symptoms and signs involving the circulatory and respiratory systems: Secondary | ICD-10-CM

## 2011-10-17 DIAGNOSIS — I6529 Occlusion and stenosis of unspecified carotid artery: Secondary | ICD-10-CM | POA: Diagnosis not present

## 2011-11-20 ENCOUNTER — Other Ambulatory Visit: Payer: Self-pay

## 2011-11-20 NOTE — Telephone Encounter (Signed)
Pt request refill on Simvastatin,Lisinopril and new rx for Cialis. Pt not out of med; Pt was to have f/u visit in 08/13. Pt scheduled f/u 11/21/11 at 12 noon. Pt advised and understands.

## 2011-11-21 ENCOUNTER — Telehealth: Payer: Self-pay | Admitting: *Deleted

## 2011-11-21 ENCOUNTER — Encounter: Payer: Self-pay | Admitting: Family Medicine

## 2011-11-21 ENCOUNTER — Ambulatory Visit (INDEPENDENT_AMBULATORY_CARE_PROVIDER_SITE_OTHER): Payer: Medicare Other | Admitting: Family Medicine

## 2011-11-21 VITALS — BP 120/68 | HR 80 | Temp 98.1°F | Wt 164.0 lb

## 2011-11-21 DIAGNOSIS — E785 Hyperlipidemia, unspecified: Secondary | ICD-10-CM | POA: Diagnosis not present

## 2011-11-21 DIAGNOSIS — N529 Male erectile dysfunction, unspecified: Secondary | ICD-10-CM

## 2011-11-21 DIAGNOSIS — I10 Essential (primary) hypertension: Secondary | ICD-10-CM | POA: Diagnosis not present

## 2011-11-21 MED ORDER — SIMVASTATIN 20 MG PO TABS: 20.0000 mg | ORAL_TABLET | Freq: Every day | ORAL | Status: DC

## 2011-11-21 MED ORDER — LISINOPRIL 20 MG PO TABS: 20.0000 mg | ORAL_TABLET | Freq: Every day | ORAL | Status: DC

## 2011-11-21 MED ORDER — TADALAFIL 10 MG PO TABS: 10.0000 mg | ORAL_TABLET | Freq: Every day | ORAL | Status: DC | PRN

## 2011-11-21 NOTE — Telephone Encounter (Signed)
I tried cancelling cialis from [facility]. They said that I will have to call back next week in order to do that. Will call back next week.

## 2011-11-21 NOTE — Patient Instructions (Signed)
Trial of cialis as needed Good to see you today. Refilled meds today. Return in December for medicare wellness visit, prior fasting for blood work

## 2011-11-21 NOTE — Progress Notes (Signed)
  Subjective:    [name], male    DOB: Nov 06, 1928, 78 y.o.   MRN: [id number]  HPI CC: med refill  H/o CAD s/p 4v CABG 2003, Myoview (2008) normal, with EF 63% and normal perfusion.  H/o CLL.  ED - has tried levitra and viagra - caused weakness and dizziness.  Was on 1/2 of minimum dose.  Some post coital depression.  Slight vision changes as well.  No HA.  Did have good results with erection however.  Would like to try cialis.  Has pump but doesn't like after effect.  HTN - No HA, vision changes, CP/tightness, SOB, leg swelling. Compliant with lisinopril.  Has had dry cough longstanding, not bothersome.  HLD - no myalgias. Lab Results  Component Value Date   CHOL 93 02/25/2011   HDL 35.30* 02/25/2011   LDLCALC 42 02/25/2011   TRIG 78.0 02/25/2011   CHOLHDL 3 02/25/2011   Past Medical History  Diagnosis Date  . CAD (coronary artery disease)     S/P CAB x 4 1/03; Myoview 08 EF 63% normal perfusion  . GERD (gastroesophageal reflux disease) 1990s  . Hyperlipemia 2002  . Hypertension 2002  . CLL (chronic lymphoblastic leukemia)     Dr. Welton Flakes  . Thrombocytopenia     Dr. Park Breed  . History of herpes genitalis     valtrex daily  . Carotid stenosis     R: 60-79%, stable (last [place] 09/2010)  . Diastolic CHF 2013    grade 1  . Aortic stenosis 2013    mild    Last medicare wellness visit with Dr. Hetty Ely 02/28/2011  Review of Systems Per HPI    Objective:   Physical Exam  Nursing note and vitals reviewed. Constitutional: He appears well-developed and well-nourished. No distress.  HENT:  Head: Normocephalic and atraumatic.  Mouth/Throat: Oropharynx is clear and moist. No oropharyngeal exudate.  Eyes: Conjunctivae and EOM are normal. Pupils are equal, round, and reactive to light. No scleral icterus.  Neck: Normal range of motion. Neck supple. Carotid bruit is present (faint on right).  Cardiovascular: Normal rate, regular rhythm and intact distal pulses.     Murmur (2/6 SEM best at LUSB) heard. Pulmonary/Chest: Effort normal and breath sounds normal. No respiratory distress. He has no wheezes. He has no rales.  Skin: Skin is warm and dry. No rash noted.  Psychiatric: He has a normal mood and affect.       Assessment & Plan:

## 2011-11-21 NOTE — Assessment & Plan Note (Signed)
Trial of cialis.  Has not liked side effects to viagra or levitra.  Doesn't like after effect of pump.

## 2011-11-21 NOTE — Telephone Encounter (Signed)
Yes sent in cialis to wrong pharmacy.  [facility] and notified patient. Can we cancel cialis at mail order? Thanks.

## 2011-11-21 NOTE — Assessment & Plan Note (Signed)
Chronic, stable. Continue lisinopril. Refilled med.

## 2011-11-21 NOTE — Telephone Encounter (Signed)
Patient called and said a Rx was supposed to be [facility] to [facility] for him to pick up today. I saw where 3 were sent to mail order. Was one supposed to go local or was there another that was supposed to be sent?

## 2011-11-21 NOTE — Assessment & Plan Note (Signed)
Chronic, stable.  Tolerating med well. Continue.

## 2011-11-25 NOTE — Telephone Encounter (Signed)
Rx cancelled.

## 2012-01-15 ENCOUNTER — Telehealth: Payer: Self-pay | Admitting: *Deleted

## 2012-01-15 ENCOUNTER — Encounter: Payer: Self-pay | Admitting: Oncology

## 2012-01-15 ENCOUNTER — Ambulatory Visit (HOSPITAL_BASED_OUTPATIENT_CLINIC_OR_DEPARTMENT_OTHER): Payer: Medicare Other | Admitting: Oncology

## 2012-01-15 ENCOUNTER — Other Ambulatory Visit (HOSPITAL_BASED_OUTPATIENT_CLINIC_OR_DEPARTMENT_OTHER): Payer: Medicare Other

## 2012-01-15 VITALS — BP 129/68 | HR 67 | Temp 98.3°F | Resp 20 | Ht 69.0 in | Wt 164.5 lb

## 2012-01-15 DIAGNOSIS — C9110 Chronic lymphocytic leukemia of B-cell type not having achieved remission: Secondary | ICD-10-CM

## 2012-01-15 DIAGNOSIS — D649 Anemia, unspecified: Secondary | ICD-10-CM | POA: Diagnosis not present

## 2012-01-15 LAB — COMPREHENSIVE METABOLIC PANEL (CC13)
ALT: 27 U/L (ref 0–55)
AST: 31 U/L (ref 5–34)
Albumin: 4 g/dL (ref 3.5–5.0)
Alkaline Phosphatase: 109 U/L (ref 40–150)
BUN: 18 mg/dL (ref 7.0–26.0)
CO2: 24 mEq/L (ref 22–29)
Calcium: 9.7 mg/dL (ref 8.4–10.4)
Chloride: 106 mEq/L (ref 98–107)
Creatinine: 1.6 mg/dL — ABNORMAL HIGH (ref 0.7–1.3)
Glucose: 143 mg/dl — ABNORMAL HIGH (ref 70–99)
Potassium: 4.3 mEq/L (ref 3.5–5.1)
Sodium: 138 mEq/L (ref 136–145)
Total Bilirubin: 0.6 mg/dL (ref 0.20–1.20)
Total Protein: 6.8 g/dL (ref 6.4–8.3)

## 2012-01-15 LAB — TECHNOLOGIST REVIEW

## 2012-01-15 LAB — CBC WITH DIFFERENTIAL/PLATELET
BASO%: 0.2 % (ref 0.0–2.0)
Basophils Absolute: 0.1 10*3/uL (ref 0.0–0.1)
EOS%: 0.4 % (ref 0.0–7.0)
Eosinophils Absolute: 0.2 10*3/uL (ref 0.0–0.5)
HCT: 37.5 % — ABNORMAL LOW (ref 38.4–49.9)
HGB: 12.3 g/dL — ABNORMAL LOW (ref 13.0–17.1)
LYMPH%: 91 % — ABNORMAL HIGH (ref 14.0–49.0)
MCH: 35.4 pg — ABNORMAL HIGH (ref 27.2–33.4)
MCHC: 32.7 g/dL (ref 32.0–36.0)
MCV: 108.2 fL — ABNORMAL HIGH (ref 79.3–98.0)
MONO#: 1.1 10*3/uL — ABNORMAL HIGH (ref 0.1–0.9)
MONO%: 1.7 % (ref 0.0–14.0)
NEUT#: 4.4 10*3/uL (ref 1.5–6.5)
NEUT%: 6.7 % — ABNORMAL LOW (ref 39.0–75.0)
Platelets: 81 10*3/uL — ABNORMAL LOW (ref 140–400)
RBC: 3.46 10*6/uL — ABNORMAL LOW (ref 4.20–5.82)
RDW: 14.9 % — ABNORMAL HIGH (ref 11.0–14.6)
WBC: 65.1 10*3/uL (ref 4.0–10.3)
lymph#: 59.2 10*3/uL — ABNORMAL HIGH (ref 0.9–3.3)
nRBC: 0 % (ref 0–0)

## 2012-01-15 NOTE — Telephone Encounter (Signed)
Gave patient appointment for lab only starting at 02-14-2012 every month gave patient appointment for 04-2012

## 2012-01-15 NOTE — Progress Notes (Signed)
OFFICE PROGRESS NOTE  CC:  Eustaquio Boyden, MD [address] [place] [address]  DIAGNOSIS: 78 year old gentleman with stage I chronic lymphocytic leukemia diagnosed 4 years ago  PRIOR THERAPY: Observation  CURRENT THERAPY: Observation  INTERVAL HISTORY: Jimmy [name] 78 y.o. male returns for a followup visit today. Overall patient is doing well. He remains pretty stable from his CLL point of view. His CBC reveals a white count of 65.1 which is improved platelets are 81,000 and stable. He otherwise denies any fevers chills night sweats no recent hospitalizations no pneumonia is no bleeding or bruising his energy level is very stable. He remains very active. Remainder of the 10 point review of systems is negative. MEDICAL HISTORY: Past Medical History  Diagnosis Date  . CAD (coronary artery disease)     S/P CAB x 4 1/03; Myoview 08 EF 63% normal perfusion  . GERD (gastroesophageal reflux disease) 1990s  . Hyperlipemia 2002  . Hypertension 2002  . CLL (chronic lymphoblastic leukemia)     Dr. Welton Flakes  . Thrombocytopenia     Dr. Park Breed  . History of herpes genitalis     valtrex daily  . Carotid stenosis     R: 60-79%, stable (last [place] 09/2011)  . Diastolic CHF 2013    grade 1  . Aortic stenosis 2013    mild    ALLERGIES:  is allergic to new skin.  MEDICATIONS:  Current Outpatient Prescriptions  Medication Sig Dispense Refill  . aspirin 81 MG EC tablet Take 162 mg by mouth daily.        . fish oil-omega-3 fatty acids 1000 MG capsule Take 1,400 mg by mouth daily.       . Glycerin, Laxative, ([employer] SUPP[facility]ITORY, ADULT, RE) Place 1 Units rectally as needed.        . hydrocortisone (ANUSOL-HC) 25 MG suppository Place 1 suppository (25 mg total) rectally 2 (two) times daily as needed.  12 suppository  3  . lisinopril (PRINIVIL,ZESTRIL) 20 MG tablet Take 1 tablet (20 mg total) by mouth daily.  90 tablet  3  . polyethylene glycol (MIRALAX / GLYCOLAX) packet Take 17 g by  mouth daily as needed.      . psyllium (METAMUCIL) 58.6 % powder Take 1 packet by mouth 2 (two) times daily.      . simvastatin (ZOCOR) 20 MG tablet Take 1 tablet (20 mg total) by mouth at bedtime.  90 tablet  3  . tadalafil (CIALIS) 10 MG tablet Take 1 tablet (10 mg total) by mouth daily as needed for erectile dysfunction.  10 tablet  0  . valACYclovir (VALTREX) 1000 MG tablet Take 0.5 tablets (500 mg total) by mouth 2 (two) times daily.  90 tablet  3    SURGICAL HISTORY:  Past Surgical History  Procedure Date  . Hepatic artery angioplasty [address], [place]  . Hemorroidectomy 1954    Fissure repair, [place]  . Kidney stone surgery 1977    Dr Jenne Pane  . Lithotripsy 1990s    Multiple  . Angioplasty 1998  . Coronary angioplasty 1998  . Coronary artery bypass graft 04/23/2001    x4, Dr. Nydia Bouton  . Esophagogastroduodenoscopy 11/29/1987    Gastritis  . Colonoscopy 11/29/1987    Normal  . Colonoscopy 02/07/2003    Hemm. Internal, focal proctitis, negative biopsy  . Colonoscopy 12/12/2004    Internal external hemorrhoids, +proctits, negative biopsy  . Cataract extraction, bilateral     R 1/09,  L 8/09  . Ett 12/10/2006    Persantine myoview nml  . US echocardiography 07/2011    nl sys fxn, EF 55-60%, grade 1 diast dysfunction, mild AS, mildly elevated PA pressure    REVIEW OF SYSTEMS:  Pertinent items are noted in HPI.     ECOG PERFORMANCE STATUS: 1 - Symptomatic but completely ambulatory  Blood pressure 129/68, pulse 67, temperature 98.3 F (36.8 C), temperature source Oral, resp. rate 20, height 5\' 9"  (1.753 m), weight 164 lb 8 oz (74.617 kg).  HEENT exam EOMI PERRLA sclerae anicteric no conjunctival pallor oral mucosa is moist no posterior pharyngeal wall erythema neck is supple there is palpable shotty lymph nodes in axilla and cervical regions. Lungs: Clear to auscultation and percussion cardiovascular: Regular rate rhythm no murmurs abdomen soft nontender nondistended  bowel sounds are present no hepatomegaly, active spleen is palpable below the left costal margin extremities: Petechiae noted otherwise no cyanosis clubbing or edema  LABORATORY DATA: Lab Results  Component Value Date   WBC 65.1* 01/15/2012   HGB 12.3* 01/15/2012   HCT 37.5* 01/15/2012   MCV 108.2* 01/15/2012   PLT 81* 01/15/2012      RADIOGRAPHIC STUDIES:  No results found.  ASSESSMENT: 78 year old gentleman with stage I chronic lymphocytic leukemia.patient overall is doing well. He overall remains pretty stable. At this time we'll continue to follow him  PLAN: #1 patient's CLL is stable there is no need for any kind of treatment at this time. He is doing very well with observation only. Continue to monitor his CBC on a every month basis.   #2 I will plan on seeing the patient back in 3 months time  All questions were answered. The patient knows to call the clinic with any problems, questions or concerns. We can certainly see the patient much sooner if necessary.  I spent 15 minutes counseling the patient face to face. The total time spent in the appointment was 30 minutes.    Drue Second, MD Medical/Oncology [facility] [phone] (beeper) [phone] (Office)  01/15/2012, 3:52 PM

## 2012-01-15 NOTE — Patient Instructions (Addendum)
Continue monitoring blood count monthly  I will see you back in January 2014

## 2012-01-16 LAB — DIRECT ANTIGLOBULIN TEST (NOT AT ARMC)
DAT (Complement): NEGATIVE
DAT IgG: NEGATIVE

## 2012-01-16 LAB — HAPTOGLOBIN: Haptoglobin: 71 mg/dL (ref 45–215)

## 2012-01-16 LAB — IGG, IGA, IGM
IgA: 121 mg/dL (ref 68–379)
IgG (Immunoglobin G), Serum: 938 mg/dL (ref 650–1600)
IgM, Serum: 80 mg/dL (ref 41–251)

## 2012-02-14 ENCOUNTER — Other Ambulatory Visit (HOSPITAL_BASED_OUTPATIENT_CLINIC_OR_DEPARTMENT_OTHER): Payer: Medicare Other | Admitting: Lab

## 2012-02-14 ENCOUNTER — Telehealth: Payer: Self-pay | Admitting: Emergency Medicine

## 2012-02-14 DIAGNOSIS — C9110 Chronic lymphocytic leukemia of B-cell type not having achieved remission: Secondary | ICD-10-CM

## 2012-02-14 LAB — CBC WITH DIFFERENTIAL/PLATELET
BASO%: 0.1 % (ref 0.0–2.0)
Basophils Absolute: 0.1 10*3/uL (ref 0.0–0.1)
EOS%: 0.3 % (ref 0.0–7.0)
Eosinophils Absolute: 0.2 10*3/uL (ref 0.0–0.5)
HCT: 35.7 % — ABNORMAL LOW (ref 38.4–49.9)
HGB: 12 g/dL — ABNORMAL LOW (ref 13.0–17.1)
LYMPH%: 89.6 % — ABNORMAL HIGH (ref 14.0–49.0)
MCH: 36.4 pg — ABNORMAL HIGH (ref 27.2–33.4)
MCHC: 33.6 g/dL (ref 32.0–36.0)
MCV: 108.3 fL — ABNORMAL HIGH (ref 79.3–98.0)
MONO#: 1.6 10*3/uL — ABNORMAL HIGH (ref 0.1–0.9)
MONO%: 2.8 % (ref 0.0–14.0)
NEUT#: 4 10*3/uL (ref 1.5–6.5)
NEUT%: 7.2 % — ABNORMAL LOW (ref 39.0–75.0)
Platelets: 83 10*3/uL — ABNORMAL LOW (ref 140–400)
RBC: 3.29 10*6/uL — ABNORMAL LOW (ref 4.20–5.82)
RDW: 14.6 % (ref 11.0–14.6)
WBC: 56.1 10*3/uL (ref 4.0–10.3)
lymph#: 50.3 10*3/uL — ABNORMAL HIGH (ref 0.9–3.3)

## 2012-02-14 LAB — TECHNOLOGIST REVIEW

## 2012-02-14 NOTE — Telephone Encounter (Signed)
Informed patient of CBC results and instructed him to return for all future appointments and to call with any concerns.

## 2012-03-16 ENCOUNTER — Other Ambulatory Visit: Payer: Self-pay | Admitting: Oncology

## 2012-03-16 ENCOUNTER — Ambulatory Visit: Payer: Medicare Other | Admitting: Cardiovascular Disease

## 2012-03-16 ENCOUNTER — Other Ambulatory Visit: Payer: Medicare Other | Admitting: Lab

## 2012-03-16 ENCOUNTER — Telehealth: Payer: Self-pay | Admitting: Cardiovascular Disease

## 2012-03-16 DIAGNOSIS — R0609 Other forms of dyspnea: Secondary | ICD-10-CM

## 2012-03-16 DIAGNOSIS — R0989 Other specified symptoms and signs involving the circulatory and respiratory systems: Secondary | ICD-10-CM

## 2012-03-16 DIAGNOSIS — C9110 Chronic lymphocytic leukemia of B-cell type not having achieved remission: Secondary | ICD-10-CM

## 2012-03-16 LAB — CBC WITH DIFFERENTIAL/PLATELET
BASO%: 0.1 % (ref 0.0–2.0)
Basophils Absolute: 0.1 10*3/uL (ref 0.0–0.1)
EOS%: 0.5 % (ref 0.0–7.0)
Eosinophils Absolute: 0.3 10*3/uL (ref 0.0–0.5)
HCT: 37.5 % — ABNORMAL LOW (ref 38.4–49.9)
HGB: 12.5 g/dL — ABNORMAL LOW (ref 13.0–17.1)
LYMPH%: 89.3 % — ABNORMAL HIGH (ref 14.0–49.0)
MCH: 36.2 pg — ABNORMAL HIGH (ref 27.2–33.4)
MCHC: 33.4 g/dL (ref 32.0–36.0)
MCV: 108.3 fL — ABNORMAL HIGH (ref 79.3–98.0)
MONO#: 2 10*3/uL — ABNORMAL HIGH (ref 0.1–0.9)
MONO%: 3 % (ref 0.0–14.0)
NEUT#: 4.7 10*3/uL (ref 1.5–6.5)
NEUT%: 7.1 % — ABNORMAL LOW (ref 39.0–75.0)
Platelets: 80 10*3/uL — ABNORMAL LOW (ref 140–400)
RBC: 3.46 10*6/uL — ABNORMAL LOW (ref 4.20–5.82)
RDW: 14 % (ref 11.0–14.6)
WBC: 66 10*3/uL (ref 4.0–10.3)
lymph#: 59 10*3/uL — ABNORMAL HIGH (ref 0.9–3.3)

## 2012-03-16 NOTE — Telephone Encounter (Signed)
PT bp is running higher 172/71 usually it runs in the 120's. Wants to know if he needs to do anything

## 2012-03-16 NOTE — Telephone Encounter (Signed)
Pt says he has not checked his BP "in a while" so he decided to check it last night while watching football Says BP=172/71 last night He has not checked it again today and is visiting a friend at [facility] I advised him to keep a log of his BPs over the next few days and call us back by Wednesday/thurdsay to report Understanding verb

## 2012-03-18 NOTE — Telephone Encounter (Signed)
Pt calling with bp readings  12/16 1200 145/71  300 pm 155/74 700 pm 151/69  12/17  9 am 122/59  1230 126/63 430 pm 131/67 9 pm 140/64  12/18  330 pm 143/66

## 2012-03-18 NOTE — Telephone Encounter (Signed)
He could increase his lisinopril to 40 mg daily. Would try to get systolic pressures consistently 120-130.

## 2012-03-18 NOTE — Telephone Encounter (Signed)
See below

## 2012-03-19 ENCOUNTER — Other Ambulatory Visit: Payer: Self-pay

## 2012-03-19 MED ORDER — LISINOPRIL 40 MG PO TABS: 40.0000 mg | ORAL_TABLET | Freq: Every day | ORAL | Status: DC

## 2012-03-19 NOTE — Telephone Encounter (Signed)
Pt informed Understanding verb 

## 2012-03-20 ENCOUNTER — Ambulatory Visit: Payer: Medicare Other | Admitting: Cardiovascular Disease

## 2012-03-26 ENCOUNTER — Encounter: Payer: Self-pay | Admitting: Cardiovascular Disease

## 2012-03-26 ENCOUNTER — Ambulatory Visit (INDEPENDENT_AMBULATORY_CARE_PROVIDER_SITE_OTHER): Payer: Medicare Other | Admitting: Cardiovascular Disease

## 2012-03-26 VITALS — BP 132/68 | HR 63 | Ht 69.0 in | Wt 169.5 lb

## 2012-03-26 DIAGNOSIS — E785 Hyperlipidemia, unspecified: Secondary | ICD-10-CM

## 2012-03-26 DIAGNOSIS — I2510 Atherosclerotic heart disease of native coronary artery without angina pectoris: Secondary | ICD-10-CM

## 2012-03-26 DIAGNOSIS — I10 Essential (primary) hypertension: Secondary | ICD-10-CM

## 2012-03-26 MED ORDER — AMLODIPINE BESYLATE 10 MG PO TABS: 10.0000 mg | ORAL_TABLET | Freq: Every day | ORAL | Status: DC

## 2012-03-26 NOTE — Progress Notes (Signed)
[name], male    DOB: 04-Mar-1929, 78 y.o.   MRN: [id number]  HPI Comments: 78 year old male with a history of CLL (WBC ~ 50k), coronary artery disease, status post bypass surgery in 2003. Myoview in September 2008, which showed ejection fraction of 63% with no ischemia or infarct.  history of hyperlipidemia, hypertension,  carotid stensosis and minimal bilateral renal artery stenosis.   Echo 10/09 EF 55-65% mild diastolic dysfunction Carotid u/s 2012: RICA 60-79% LICA 0-39%.   He returns today for routine followup. Doing well.  Remains very active goes to the Gym  5 days /week.  No CP or SOB.   No focal neuro deficits/TIAs. He does report that his blood pressure has been running high. Systolic pressures in the 130-150 range  EKG shows normal sinus rhythm with rate 63 beats per minute with no significant ST or T wave changes     Outpatient Encounter Prescriptions as of 03/26/2012  Medication Sig Dispense Refill  . aspirin 81 MG EC tablet Take 162 mg by mouth daily.        [name] lisinopril (PRINIVIL,ZESTRIL) 40 MG tablet Take 1 tablet (40 mg total) by mouth daily.  90 tablet  3  . polyethylene glycol (MIRALAX / GLYCOLAX) packet Take 17 g by mouth daily as needed.      . simvastatin (ZOCOR) 20 MG tablet Take 1 tablet (20 mg total) by mouth at bedtime.  90 tablet  3  . valACYclovir (VALTREX) 1000 MG tablet Take 0.5 tablets (500 mg total) by mouth 2 (two) times daily.  90 tablet  3  . amLODipine (NORVASC) 10 MG tablet Take 1 tablet (10 mg total) by mouth daily.  30 tablet  11  . tadalafil (CIALIS) 10 MG tablet Take 1 tablet (10 mg total) by mouth daily as needed for erectile dysfunction.  10 tablet  0  . [DISCONTINUED] fish oil-omega-3 fatty acids 1000 MG capsule Take 1,400 mg by mouth daily.       . [DISCONTINUED] Glycerin, Laxative, ([employer] SUPPOSITORY, ADULT, RE) Place 1 Units rectally as needed.        . [DISCONTINUED] hydrocortisone (ANUSOL-HC) 25 MG suppository Place 1  suppository (25 mg total) rectally 2 (two) times daily as needed.  12 suppository  3  . [DISCONTINUED] psyllium (METAMUCIL) 58.6 % powder Take 1 packet by mouth 2 (two) times daily.         Review of Systems  Constitutional: Negative.   HENT: Negative.   Eyes: Negative.   Respiratory: Negative.   Cardiovascular: Negative.   Gastrointestinal: Negative.   Musculoskeletal: Negative.   Skin: Negative.   Neurological: Negative.   Hematological: Negative.   Psychiatric/Behavioral: Negative.   All other systems reviewed and are negative.    BP 132/68  Pulse 63  Ht 5\' 9"  (1.753 m)  Wt 169 lb 8 oz (76.885 kg)  BMI 25.03 kg/m2  Physical Exam  Nursing note and vitals reviewed. Constitutional: He is oriented to person, place, and time. He appears well-developed and well-nourished.  HENT:  Head: Normocephalic.  Nose: Nose normal.  Mouth/Throat: Oropharynx is clear and moist.  Eyes: Conjunctivae normal are normal. Pupils are equal, round, and reactive to light.  Neck: Normal range of motion. Neck supple. No JVD present.  Cardiovascular: Normal rate, regular rhythm, S1 normal, S2 normal, normal heart sounds and intact distal pulses.  Exam reveals no gallop and no friction rub.   No murmur heard. Pulmonary/Chest: Effort normal and breath sounds normal.  No respiratory distress. He has no wheezes. He has no rales. He exhibits no tenderness.  Abdominal: Soft. Bowel sounds are normal. He exhibits no distension. There is no tenderness.  Musculoskeletal: Normal range of motion. He exhibits no edema and no tenderness.  Lymphadenopathy:    He has no cervical adenopathy.  Neurological: He is alert and oriented to person, place, and time. Coordination normal.  Skin: Skin is warm and dry. No rash noted. No erythema.  Psychiatric: He has a normal mood and affect. His behavior is normal. Judgment and thought content normal.           Assessment and Plan

## 2012-03-26 NOTE — Assessment & Plan Note (Signed)
We will add Amlodipine 5 mg daily. If blood pressure continues to be elevated after one week, we will increase the amlodipine to 10 mg daily.

## 2012-03-26 NOTE — Patient Instructions (Addendum)
You are doing well. Please start amlodipine daily for blood pressure (start with 1/2 pill for the first week) Increase to a full pill for high blood pressure (top number greater than 140)  Please call us if you have new issues that need to be addressed before your next appt.  Your physician wants you to follow-up in: 6 months.  You will receive a reminder letter in the mail two months in advance. If you don't receive a letter, please call our office to schedule the follow-up appointment.

## 2012-03-26 NOTE — Assessment & Plan Note (Signed)
Cholesterol is at goal on the current lipid regimen. No changes to the medications were made.  

## 2012-03-26 NOTE — Assessment & Plan Note (Signed)
Currently with no symptoms of angina. No further workup at this time. Continue current medication regimen. 

## 2012-04-15 ENCOUNTER — Telehealth: Payer: Self-pay | Admitting: *Deleted

## 2012-04-15 ENCOUNTER — Encounter: Payer: Self-pay | Admitting: Oncology

## 2012-04-15 ENCOUNTER — Other Ambulatory Visit (HOSPITAL_BASED_OUTPATIENT_CLINIC_OR_DEPARTMENT_OTHER): Payer: Medicare Other | Admitting: Lab

## 2012-04-15 ENCOUNTER — Ambulatory Visit (HOSPITAL_BASED_OUTPATIENT_CLINIC_OR_DEPARTMENT_OTHER): Payer: Medicare Other | Admitting: Oncology

## 2012-04-15 VITALS — BP 96/56 | HR 87 | Temp 97.7°F | Resp 20 | Ht 69.0 in | Wt 168.8 lb

## 2012-04-15 DIAGNOSIS — C9110 Chronic lymphocytic leukemia of B-cell type not having achieved remission: Secondary | ICD-10-CM

## 2012-04-15 LAB — CBC WITH DIFFERENTIAL/PLATELET
BASO%: 0.1 % (ref 0.0–2.0)
Basophils Absolute: 0.1 10*3/uL (ref 0.0–0.1)
EOS%: 0.6 % (ref 0.0–7.0)
Eosinophils Absolute: 0.4 10*3/uL (ref 0.0–0.5)
HCT: 36.7 % — ABNORMAL LOW (ref 38.4–49.9)
HGB: 12.3 g/dL — ABNORMAL LOW (ref 13.0–17.1)
LYMPH%: 89.3 % — ABNORMAL HIGH (ref 14.0–49.0)
MCH: 35.7 pg — ABNORMAL HIGH (ref 27.2–33.4)
MCHC: 33.5 g/dL (ref 32.0–36.0)
MCV: 106.6 fL — ABNORMAL HIGH (ref 79.3–98.0)
MONO#: 1.6 10*3/uL — ABNORMAL HIGH (ref 0.1–0.9)
MONO%: 2.6 % (ref 0.0–14.0)
NEUT#: 4.5 10*3/uL (ref 1.5–6.5)
NEUT%: 7.4 % — ABNORMAL LOW (ref 39.0–75.0)
Platelets: 87 10*3/uL — ABNORMAL LOW (ref 140–400)
RBC: 3.44 10*6/uL — ABNORMAL LOW (ref 4.20–5.82)
RDW: 14 % (ref 11.0–14.6)
WBC: 61.2 10*3/uL (ref 4.0–10.3)
lymph#: 54.7 10*3/uL — ABNORMAL HIGH (ref 0.9–3.3)

## 2012-04-15 LAB — TECHNOLOGIST REVIEW

## 2012-04-15 NOTE — Patient Instructions (Addendum)
Doing well  Lab Results  Component Value Date   WBC 61.2* 04/15/2012   HGB 12.3* 04/15/2012   HCT 36.7* 04/15/2012   MCV 106.6* 04/15/2012   PLT 87* 04/15/2012   I will see you back in 4 months

## 2012-04-15 NOTE — Progress Notes (Signed)
OFFICE PROGRESS NOTE  CC:  Eustaquio Boyden, MD [address] [place] [address]  DIAGNOSIS: 79 year old gentleman with stage I chronic lymphocytic leukemia diagnosed 4 years ago  PRIOR THERAPY: Observation  CURRENT THERAPY: Observation  INTERVAL HISTORY: DUTCH ING 79 y.o. male returns for a followup visit today. Overall patient is doing well. He remains pretty stable from his CLL point of view. He otherwise denies any fevers chills night sweats no recent hospitalizations no pneumonia is no bleeding or bruising his energy level is very stable. He remains very active. Remainder of the 10 point review of systems is negative. MEDICAL HISTORY: Past Medical History  Diagnosis Date  . CAD (coronary artery disease)     S/P CAB x 4 1/03; Myoview 08 EF 63% normal perfusion  . GERD (gastroesophageal reflux disease) 1990s  . Hyperlipemia 2002  . Hypertension 2002  . CLL (chronic lymphoblastic leukemia)     Dr. Welton Flakes  . Thrombocytopenia     Dr. Park Breed  . History of herpes genitalis     valtrex daily  . Carotid stenosis     R: 60-79%, stable (last [place] 09/2011)  . Diastolic CHF 2013    grade 1  . Aortic stenosis 2013    mild    ALLERGIES:  is allergic to new skin.  MEDICATIONS:  Current Outpatient Prescriptions  Medication Sig Dispense Refill  . amLODipine (NORVASC) 10 MG tablet Take 1 tablet (10 mg total) by mouth daily.  30 tablet  11  . aspirin 81 MG EC tablet Take 162 mg by mouth daily.        [name] lisinopril (PRINIVIL,ZESTRIL) 40 MG tablet Take 1 tablet (40 mg total) by mouth daily.  90 tablet  3  . polyethylene glycol (MIRALAX / GLYCOLAX) packet Take 17 g by mouth daily as needed.      . simvastatin (ZOCOR) 20 MG tablet Take 1 tablet (20 mg total) by mouth at bedtime.  90 tablet  3  . valACYclovir (VALTREX) 1000 MG tablet Take 0.5 tablets (500 mg total) by mouth 2 (two) times daily.  90 tablet  3  . tadalafil (CIALIS) 10 MG tablet Take 1 tablet (10 mg total) by  mouth daily as needed for erectile dysfunction.  10 tablet  0    SURGICAL HISTORY:  Past Surgical History  Procedure Date  . Hepatic artery angioplasty [address] [place]  . Hemorroidectomy 1954    Fissure repair, [place]  . Kidney stone surgery 1977    Dr Jenne Pane  . Lithotripsy 1990s    Multiple  . Angioplasty 1998  . Coronary angioplasty 1998  . Coronary artery bypass graft 04/23/2001    x4, Dr. Nydia Bouton  . Esophagogastroduodenoscopy 11/29/1987    Gastritis  . Colonoscopy 11/29/1987    Normal  . Colonoscopy 02/07/2003    Hemm. Internal, focal proctitis, negative biopsy  . Colonoscopy 12/12/2004    Internal external hemorrhoids, +proctits, negative biopsy  . Cataract extraction, bilateral     R 1/09, L 8/09  . Ett 12/10/2006    Persantine myoview nml  . US echocardiography 07/2011    nl sys fxn, EF 55-60%, grade 1 diast dysfunction, mild AS, mildly elevated PA pressure    REVIEW OF SYSTEMS:  Pertinent items are noted in HPI.     ECOG PERFORMANCE STATUS: 1 - Symptomatic but completely ambulatory  Blood pressure 96/56, pulse 87, temperature 97.7 F (36.5 C), resp. rate 20, height 5\' 9"  (1.753 m),  weight 168 lb 12.8 oz (76.567 kg).  HEENT exam EOMI PERRLA sclerae anicteric no conjunctival pallor oral mucosa is moist no posterior pharyngeal wall erythema neck is supple there is palpable shotty lymph nodes in axilla and cervical regions. Lungs: Clear to auscultation and percussion cardiovascular: Regular rate rhythm no murmurs abdomen soft nontender nondistended bowel sounds are present no hepatomegaly, active spleen is palpable below the left costal margin extremities: Petechiae noted otherwise no cyanosis clubbing or edema  LABORATORY DATA: Lab Results  Component Value Date   WBC 61.2* 04/15/2012   HGB 12.3* 04/15/2012   HCT 36.7* 04/15/2012   MCV 106.6* 04/15/2012   PLT 87* 04/15/2012      RADIOGRAPHIC STUDIES:  No results found.  ASSESSMENT: 79 year old  gentleman with stage I chronic lymphocytic leukemia.patient overall is doing well. He overall remains pretty stable. At this time we'll continue to follow him  PLAN: #1 patient's CLL is stable there is no need for any kind of treatment at this time. He is doing very well with observation only. Continue to monitor his CBC on a every month basis.   #2 I will plan on seeing the patient back in 4 months time  All questions were answered. The patient knows to call the clinic with any problems, questions or concerns. We can certainly see the patient much sooner if necessary.  I spent 15 minutes counseling the patient face to face. The total time spent in the appointment was 30 minutes.    Drue Second, MD Medical/Oncology [facility] [phone] (beeper) [phone] (Office)  04/15/2012, 10:14 AM

## 2012-04-15 NOTE — Telephone Encounter (Signed)
Gave patient appointment for 06-2012 

## 2012-04-16 ENCOUNTER — Telehealth: Payer: Self-pay | Admitting: *Deleted

## 2012-04-16 NOTE — Telephone Encounter (Signed)
Pt started amlodipine 1/2 tablet qd per Dr. Windell Hummingbird request at last OV Says he has had some intermittent dizziness and low BP readings since starting this Went to Dr. Milta Deiters office yesterday and BP=96/56 I advised to try holding the amlodipine and see if dizziness improves as well as BPs He will try this Says they had a recent move and thinks this stress added to elevated Bps I will reassess 04/20/12

## 2012-04-16 NOTE — Telephone Encounter (Signed)
Pt called stating that he thinks his BP meds may be working to good. He says he has been getting dizzy

## 2012-04-20 ENCOUNTER — Telehealth: Payer: Self-pay

## 2012-04-20 NOTE — Telephone Encounter (Signed)
Per wife, pt not home, will be back at 1200 Will call back

## 2012-04-20 NOTE — Telephone Encounter (Signed)
looks good

## 2012-04-20 NOTE — Telephone Encounter (Signed)
Assess BPs 

## 2012-04-20 NOTE — Telephone Encounter (Signed)
FYI

## 2012-04-20 NOTE — Telephone Encounter (Signed)
Pt reports BP has improved since holding the amlodipine It has been as follows: 126/66 122/63 116/75 130/84-pm 138/88-pm He confirms compliance with lisinopril 40 mg, takes 2 20 mg tablets in am I advised ok to try taking lisinopril 20 mg BID to help with evening BPs He will try this and continue to hold amlodipine he will call us should symptoms return/BP become too high/too low

## 2012-05-07 ENCOUNTER — Encounter (INDEPENDENT_AMBULATORY_CARE_PROVIDER_SITE_OTHER): Payer: Medicare Other

## 2012-05-07 DIAGNOSIS — I6529 Occlusion and stenosis of unspecified carotid artery: Secondary | ICD-10-CM | POA: Diagnosis not present

## 2012-05-15 ENCOUNTER — Other Ambulatory Visit: Payer: Medicare Other | Admitting: Lab

## 2012-06-15 ENCOUNTER — Other Ambulatory Visit: Payer: Medicare Other | Admitting: Lab

## 2012-06-30 ENCOUNTER — Telehealth: Payer: Self-pay | Admitting: *Deleted

## 2012-06-30 NOTE — Telephone Encounter (Signed)
sw pt he is aware of his appt d/t  For 09/28/12@ 9am. i agreed to mail him a letter/cal as well.

## 2012-07-15 ENCOUNTER — Other Ambulatory Visit: Payer: Medicare Other | Admitting: Lab

## 2012-07-15 ENCOUNTER — Ambulatory Visit: Payer: Medicare Other | Admitting: Oncology

## 2012-07-20 ENCOUNTER — Ambulatory Visit: Payer: Medicare Other | Admitting: Oncology

## 2012-07-27 ENCOUNTER — Other Ambulatory Visit: Payer: Self-pay

## 2012-07-27 NOTE — Telephone Encounter (Signed)
Pt left note requesting refill on valacyclovir 500 mg taking 1 tab bid and lisinopril 20 mg taking one tab bid. Pt was last seen 11/21/2011 and was to return 03/2012 for medicare wellness exam. Dr Windell Hummingbird office refilled Lisinopril 40 mg # 90 x 3 on 1219/13.Please advise.

## 2012-07-28 MED ORDER — VALACYCLOVIR HCL 1 G PO TABS: 500.0000 mg | ORAL_TABLET | Freq: Two times a day (BID) | ORAL | Status: DC

## 2012-07-28 MED ORDER — LISINOPRIL 40 MG PO TABS: 40.0000 mg | ORAL_TABLET | Freq: Every day | ORAL | Status: DC

## 2012-07-28 NOTE — Telephone Encounter (Signed)
Spoke with patient. CPE and fasting labs scheduled. Dr. Mariah Milling sent meds to wrong pharmacy. Refilled meds.

## 2012-08-17 ENCOUNTER — Other Ambulatory Visit: Payer: Self-pay | Admitting: Family Medicine

## 2012-08-17 DIAGNOSIS — C9110 Chronic lymphocytic leukemia of B-cell type not having achieved remission: Secondary | ICD-10-CM

## 2012-08-17 DIAGNOSIS — E559 Vitamin D deficiency, unspecified: Secondary | ICD-10-CM

## 2012-08-17 DIAGNOSIS — E785 Hyperlipidemia, unspecified: Secondary | ICD-10-CM

## 2012-08-17 DIAGNOSIS — I10 Essential (primary) hypertension: Secondary | ICD-10-CM

## 2012-08-17 DIAGNOSIS — R7309 Other abnormal glucose: Secondary | ICD-10-CM

## 2012-08-21 ENCOUNTER — Telehealth: Payer: Self-pay | Admitting: *Deleted

## 2012-08-21 ENCOUNTER — Other Ambulatory Visit (INDEPENDENT_AMBULATORY_CARE_PROVIDER_SITE_OTHER): Payer: Medicare Other

## 2012-08-21 DIAGNOSIS — E559 Vitamin D deficiency, unspecified: Secondary | ICD-10-CM | POA: Diagnosis not present

## 2012-08-21 DIAGNOSIS — I10 Essential (primary) hypertension: Secondary | ICD-10-CM

## 2012-08-21 DIAGNOSIS — R7309 Other abnormal glucose: Secondary | ICD-10-CM | POA: Diagnosis not present

## 2012-08-21 DIAGNOSIS — C9110 Chronic lymphocytic leukemia of B-cell type not having achieved remission: Secondary | ICD-10-CM | POA: Diagnosis not present

## 2012-08-21 DIAGNOSIS — E785 Hyperlipidemia, unspecified: Secondary | ICD-10-CM

## 2012-08-21 DIAGNOSIS — I959 Hypotension, unspecified: Secondary | ICD-10-CM | POA: Diagnosis not present

## 2012-08-21 LAB — BASIC METABOLIC PANEL
BUN: 20 mg/dL (ref 6–23)
CO2: 28 mEq/L (ref 19–32)
Calcium: 9.2 mg/dL (ref 8.4–10.5)
Chloride: 106 mEq/L (ref 96–112)
Creatinine, Ser: 1.6 mg/dL — ABNORMAL HIGH (ref 0.4–1.5)
GFR: 44.26 mL/min — ABNORMAL LOW (ref >60.00)
Glucose, Bld: 93 mg/dL (ref 70–99)
Potassium: 4.6 mEq/L (ref 3.5–5.1)
Sodium: 140 mEq/L (ref 135–145)

## 2012-08-21 LAB — CBC WITH DIFFERENTIAL/PLATELET
Basophils Absolute: 0 10*3/uL (ref 0.0–0.1)
Basophils Relative: 0.1 % (ref 0.0–3.0)
Eosinophils Absolute: 0.3 10*3/uL (ref 0.0–0.7)
Eosinophils Relative: 0.5 % (ref 0.0–5.0)
HCT: 36.3 % — ABNORMAL LOW (ref 39.0–52.0)
Hemoglobin: 12 g/dL — ABNORMAL LOW (ref 13.0–17.0)
Lymphocytes Relative: 79.2 % — ABNORMAL HIGH (ref 12.0–46.0)
Lymphs Abs: 49 10*3/uL — ABNORMAL HIGH (ref 0.7–4.0)
MCHC: 33.1 g/dL (ref 30.0–36.0)
MCV: 107.1 fl — ABNORMAL HIGH (ref 78.0–100.0)
Monocytes Absolute: 6.9 10*3/uL — ABNORMAL HIGH (ref 0.1–1.0)
Monocytes Relative: 11.2 % (ref 3.0–12.0)
Neutro Abs: 5.6 10*3/uL (ref 1.4–7.7)
Neutrophils Relative %: 9 % — ABNORMAL LOW (ref 43.0–77.0)
Platelets: 87 10*3/uL — ABNORMAL LOW (ref 150.0–400.0)
RBC: 3.39 Mil/uL — ABNORMAL LOW (ref 4.22–5.81)
RDW: 15.4 % — ABNORMAL HIGH (ref 11.5–14.6)
WBC: 61.8 10*3/uL (ref 4.5–10.5)

## 2012-08-21 LAB — LIPID PANEL
Cholesterol: 78 mg/dL (ref 0–200)
HDL: 30.1 mg/dL — ABNORMAL LOW (ref >39.00)
LDL Cholesterol: 33 mg/dL (ref 0–99)
Total CHOL/HDL Ratio: 3
Triglycerides: 76 mg/dL (ref 0.0–149.0)
VLDL: 15.2 mg/dL (ref 0.0–40.0)

## 2012-08-21 LAB — HEMOGLOBIN A1C: Hgb A1c MFr Bld: 6.2 % (ref 4.6–6.5)

## 2012-08-21 NOTE — Telephone Encounter (Signed)
Noted.  Known leukocytosis in h/o CLL.

## 2012-08-21 NOTE — Telephone Encounter (Signed)
Critical labs was Wbc 61.8. Other tests still pending.

## 2012-08-22 LAB — VITAMIN D 25 HYDROXY (VIT D DEFICIENCY, FRACTURES): Vit D, 25-Hydroxy: 30 ng/mL (ref 30–89)

## 2012-08-27 ENCOUNTER — Ambulatory Visit (INDEPENDENT_AMBULATORY_CARE_PROVIDER_SITE_OTHER): Payer: Medicare Other | Admitting: Family Medicine

## 2012-08-27 ENCOUNTER — Encounter: Payer: Self-pay | Admitting: Family Medicine

## 2012-08-27 VITALS — BP 134/62 | HR 72 | Temp 98.1°F | Ht 69.25 in | Wt 161.5 lb

## 2012-08-27 DIAGNOSIS — I10 Essential (primary) hypertension: Secondary | ICD-10-CM

## 2012-08-27 DIAGNOSIS — M542 Cervicalgia: Secondary | ICD-10-CM

## 2012-08-27 DIAGNOSIS — I6529 Occlusion and stenosis of unspecified carotid artery: Secondary | ICD-10-CM

## 2012-08-27 DIAGNOSIS — R7309 Other abnormal glucose: Secondary | ICD-10-CM

## 2012-08-27 DIAGNOSIS — Z0000 Encounter for general adult medical examination without abnormal findings: Secondary | ICD-10-CM | POA: Insufficient documentation

## 2012-08-27 DIAGNOSIS — C9110 Chronic lymphocytic leukemia of B-cell type not having achieved remission: Secondary | ICD-10-CM

## 2012-08-27 DIAGNOSIS — E785 Hyperlipidemia, unspecified: Secondary | ICD-10-CM

## 2012-08-27 DIAGNOSIS — I2510 Atherosclerotic heart disease of native coronary artery without angina pectoris: Secondary | ICD-10-CM

## 2012-08-27 MED ORDER — VALACYCLOVIR HCL 500 MG PO TABS: 500.0000 mg | ORAL_TABLET | Freq: Every day | ORAL | Status: DC

## 2012-08-27 MED ORDER — BACLOFEN 10 MG PO TABS: 10.0000 mg | ORAL_TABLET | Freq: Three times a day (TID) | ORAL | Status: DC | PRN

## 2012-08-27 NOTE — Patient Instructions (Addendum)
Consider starting vitamin D 1000 units over the counter. Increase fruits and vegetables daily. Bring me copy of living will for your chart. Good to see you today, call us with questions. For left neck pain - will treat as muscle spasm with baclofen , heating pad and stretching exercises for 1 month.  If not better, return to see me in 1 month for follow up Return as needed or in 1 year for next wellness visit

## 2012-08-27 NOTE — Assessment & Plan Note (Signed)
I have personally reviewed the Medicare Annual Wellness questionnaire and have noted 1. The patient's medical and social history 2. Their use of alcohol, tobacco or illicit drugs 3. Their current medications and supplements 4. The patient's functional ability including ADL's, fall risks, home safety risks and hearing or visual impairment. 5. Diet and physical activity 6. Evidence for depression or mood disorders The patients weight, height, BMI have been recorded in the chart.  Hearing and vision has been addressed. I have made referrals, counseling and provided education to the patient based review of the above and I have provided the pt with a written personalized care plan for preventive services. See scanned questionairre. Advanced directives discussed: pt will bring me living will copy - HCPOA desires to be wife.  Reviewed preventative protocols and updated unless pt declined. UTD immunizations. Will stop colon and prostate cancer screening.

## 2012-08-27 NOTE — Assessment & Plan Note (Signed)
Reviewed A1c with patient, encouraged avoid sweets/added sugars.

## 2012-08-27 NOTE — Assessment & Plan Note (Signed)
Followed regularly by onc.  Stable CBC.

## 2012-08-27 NOTE — Assessment & Plan Note (Signed)
Chronic, stable on lisinopril. BP Readings from Last 3 Encounters:  08/27/12 134/62  04/15/12 96/56  03/26/12 132/68

## 2012-08-27 NOTE — Assessment & Plan Note (Signed)
Chronic, stable on simvastatin. 

## 2012-08-27 NOTE — Assessment & Plan Note (Signed)
Treat for now as muscle spasm.   Update if sxs persist or worsen for further eval - consider imaging of left posterior neck in h/o CLL and lymphadenopathy. Pt advised to return in 1 mo if not improving as expected, sooner if worsening.

## 2012-08-27 NOTE — Assessment & Plan Note (Signed)
Followed by cards with regular carotid US. R 60-79%, less blockage on left.

## 2012-08-27 NOTE — Assessment & Plan Note (Signed)
Stable, asxs. 

## 2012-08-27 NOTE — Progress Notes (Signed)
Subjective:    [name], male    DOB: 02-15-1929, 79 y.o.   MRN: [id number]  HPI CC: medicare wellness visit.  Jimmy [name] presents today for medicare wellness visit.  CLL - stable WBC 60k's and plt at 80s.  Sees Dr. Welton Flakes regularly.    H/o kidney stones - stays well hydrated with water. CKD stage 3 - knows to avoid NSAIDs  Persistent pain in left neck for last several weeks - present during the day, not present at night.  Feels stiff.  Denies inciting trauma/injury.  So far has tried nothing for this.  Points to posterior neck region on left.    Notes increasing limitation with ambulation 2/2 dyspnea, hip and knee pains.  Still able to walk 1 mi on treadmill.  No longer using eliptical.  Wt Readings from Last 3 Encounters:  08/27/12 161 lb 8 oz (73.256 kg)  04/15/12 168 lb 12.8 oz (76.567 kg)  03/26/12 169 lb 8 oz (76.885 kg)    Some blurred vision recently.  Due for eye exam Passes vision screen today. Wears hearing aides. Denies falls, depression, anhedonia or sadness.  Preventative: Last colonoscopy 2006.   Prostate exam - will stop Flu shot yearly Pneumovax 1999 completed Td 2007 zostavax 2008. Advanced directives: discussed - would like wife to be HCPOA.  Will bring me copy.  Married and lives with wife 1 son died of lung cancer at 70 years old 1 daughter bipolar Activity: walks 1 mi daily on treadmill Diet: good water, some fruits/vegetables  Medications and allergies reviewed and updated in chart.  Past histories reviewed and updated if relevant as below. Patient Active Problem List   Diagnosis Date Noted  . Vitamin D deficiency 08/17/2012  . Dyspnea 06/26/2011  . Erectile dysfunction 09/18/2010  . CAROTID STENOSIS 12/01/2009  . HYPOTENSION, UNSPECIFIED 12/01/2009  . UNSPECIFIED SUBJECTIVE VISUAL DISTURBANCE 05/01/2009  . CAROTID BRUIT, RIGHT 12/21/2008  . BACK PAIN, LUMBAR 10/24/2008  . Chronic lymphocytic leukemia, Rai stage I  04/02/2007    Class: Chronic  . DERMATITIS, SEBORRHEIC NOS 12/16/2006  . GENITAL HERPES 12/04/2006  . HYPERLIPIDEMIA 12/04/2006  . HYPERTENSION 12/04/2006  . CORONARY ARTERY DISEASE 12/04/2006  . GERD 12/04/2006  . PROCTITIS 12/04/2006  . HYPERGLYCEMIA 12/04/2006  . RENAL CALCULUS, HX OF 12/04/2006   Past Medical History  Diagnosis Date  . CAD (coronary artery disease)     S/P CAB x 4 1/03; Myoview 08 EF 63% normal perfusion  . GERD (gastroesophageal reflux disease) 1990s  . Hyperlipemia 2002  . Hypertension 2002  . CLL (chronic lymphoblastic leukemia)     Dr. Welton Flakes  . Thrombocytopenia     Dr. Park Breed  . History of herpes genitalis     valtrex daily  . Carotid stenosis     R: 60-79%, stable (last [place] 09/2011)  . Diastolic CHF 2013    grade 1  . Aortic stenosis 2013    mild   Past Surgical History  Procedure Laterality Date  . Hepatic artery angioplasty  [address], [place]  . Hemorroidectomy  1954    Fissure repair, [place]  . Kidney stone surgery  1977    Dr Jenne Pane  . Lithotripsy  1990s    Multiple  . Angioplasty  1998  . Coronary angioplasty  1998  . Coronary artery bypass graft  04/23/2001    x4, Dr. Nydia Bouton  . Esophagogastroduodenoscopy  11/29/1987    Gastritis  . Colonoscopy  11/29/1987  Normal  . Colonoscopy  02/07/2003    Hemm. Internal, focal proctitis, negative biopsy  . Colonoscopy  12/12/2004    Internal external hemorrhoids, +proctits, negative biopsy  . Cataract extraction, bilateral      R 1/09, L 8/09  . Ett  12/10/2006    Persantine myoview nml  . US echocardiography  07/2011    nl sys fxn, EF 55-60%, grade 1 diast dysfunction, mild AS, mildly elevated PA pressure   History  Substance Use Topics  . Smoking status: Former Smoker -- 2.00 packs/day for 30 years    Quit date: 02/28/1979  . Smokeless tobacco: Never Used     Comment: quit 1980  . Alcohol Use: 0.0 oz/week    0 drink(s) per week     Comment: occasional wine   Family History   Problem Relation Age of Onset  . Hypertension Mother   . Heart disease Mother   . Hyperlipidemia Mother   . Hypertension Father   . Hyperlipidemia Father   . Kidney cancer Sister     Renal cell cancer  . Alcohol abuse Brother   . Diabetes Brother   . Stroke Brother   . Heart attack Brother     MI  . Diabetes Other     Sister's daughter  . Depression Daughter     Bipolar   Allergies  Allergen Reactions  . New Skin    Current Outpatient Prescriptions on File Prior to Visit  Medication Sig Dispense Refill  . aspirin 81 MG EC tablet Take 162 mg by mouth daily.        [name] lisinopril (PRINIVIL,ZESTRIL) 40 MG tablet Take 1 tablet (40 mg total) by mouth daily.  90 tablet  3  . polyethylene glycol (MIRALAX / GLYCOLAX) packet Take 17 g by mouth daily as needed.      . simvastatin (ZOCOR) 20 MG tablet Take 1 tablet (20 mg total) by mouth at bedtime.  90 tablet  3  . tadalafil (CIALIS) 10 MG tablet Take 1 tablet (10 mg total) by mouth daily as needed for erectile dysfunction.  10 tablet  0   No current facility-administered medications on file prior to visit.      Review of Systems  Constitutional: Positive for appetite change (decreased). Negative for fever, chills, activity change, fatigue and unexpected weight change.  HENT: Negative for hearing loss and neck pain.   Eyes: Positive for visual disturbance (due for f/u eye exam).  Respiratory: Positive for shortness of breath. Negative for cough, chest tightness and wheezing.   Cardiovascular: Negative for chest pain, palpitations and leg swelling.  Gastrointestinal: Positive for constipation. Negative for nausea, vomiting, abdominal pain, diarrhea, blood in stool and abdominal distention.  Genitourinary: Negative for hematuria and difficulty urinating.  Musculoskeletal: Negative for myalgias and arthralgias.  Skin: Negative for rash.  Neurological: Positive for dizziness. Negative for seizures, syncope and headaches.   Hematological: Negative for adenopathy. Does not bruise/bleed easily.  Psychiatric/Behavioral: Negative for dysphoric mood. The patient is not nervous/anxious.        Objective:   Physical Exam  Nursing note and vitals reviewed. Constitutional: He is oriented to person, place, and time. He appears well-developed and well-nourished. No distress.  HENT:  Head: Normocephalic and atraumatic.  Right Ear: External ear normal.  Left Ear: External ear normal.  Nose: Nose normal.  Mouth/Throat: Oropharynx is clear and moist. No oropharyngeal exudate.  Eyes: Conjunctivae and EOM are normal. Pupils are equal, round, and reactive to light. No  scleral icterus.  Neck: Neck supple. Carotid bruit is present (R bruit). No thyromegaly present.  Cardiovascular: Normal rate, regular rhythm, normal heart sounds and intact distal pulses.   No murmur heard. Pulses:      Radial pulses are 2+ on the right side, and 2+ on the left side.  Pulmonary/Chest: Effort normal and breath sounds normal. No respiratory distress. He has no wheezes. He has no rales.  Abdominal: Soft. Bowel sounds are normal. He exhibits no distension and no mass. There is no tenderness. There is no rebound and no guarding.  Musculoskeletal: Normal range of motion. He exhibits no edema.  No midline spine tenderness. L paraspinous tightness/swelling present.  Tender to palpation left posterior neck. Limited ROM to left 2/2 pain/tightness  Lymphadenopathy:       Head (right side): Occipital adenopathy present.       Head (left side): No occipital adenopathy present.    He has cervical adenopathy (bilateral AC superficial LAD).       Right cervical: Superficial cervical adenopathy present. No posterior cervical adenopathy present.      Left cervical: Superficial cervical adenopathy present. No posterior cervical adenopathy present.    He has no axillary adenopathy.       Right axillary: No lateral adenopathy present.       Left axillary:  No lateral adenopathy present.      Right: No inguinal and no supraclavicular adenopathy present.       Left: No inguinal and no supraclavicular adenopathy present.  Neurological: He is alert and oriented to person, place, and time.  CN grossly intact, station and gait intact  Skin: Skin is warm and dry. No rash noted.  Psychiatric: He has a normal mood and affect. His behavior is normal. Judgment and thought content normal.       Assessment & Plan:

## 2012-09-14 ENCOUNTER — Telehealth: Payer: Self-pay

## 2012-09-14 MED ORDER — BACLOFEN 10 MG PO TABS: 10.0000 mg | ORAL_TABLET | Freq: Three times a day (TID) | ORAL | Status: DC | PRN

## 2012-09-14 NOTE — Telephone Encounter (Signed)
plz notify baclofen sent in.

## 2012-09-14 NOTE — Telephone Encounter (Signed)
Pt left [facility]  pharmacy does not have Baclofen; Dan at [facility] does have baclofen; can Baclofen be sent to [facility].Please advise.

## 2012-09-15 NOTE — Telephone Encounter (Signed)
Patient notified

## 2012-09-28 ENCOUNTER — Encounter: Payer: Self-pay | Admitting: Oncology

## 2012-09-28 ENCOUNTER — Ambulatory Visit (HOSPITAL_BASED_OUTPATIENT_CLINIC_OR_DEPARTMENT_OTHER): Payer: Medicare Other | Admitting: Oncology

## 2012-09-28 ENCOUNTER — Other Ambulatory Visit (HOSPITAL_BASED_OUTPATIENT_CLINIC_OR_DEPARTMENT_OTHER): Payer: Medicare Other | Admitting: Lab

## 2012-09-28 ENCOUNTER — Telehealth: Payer: Self-pay | Admitting: *Deleted

## 2012-09-28 VITALS — BP 119/64 | HR 73 | Temp 97.8°F | Resp 20 | Ht 69.25 in | Wt 161.9 lb

## 2012-09-28 DIAGNOSIS — C9110 Chronic lymphocytic leukemia of B-cell type not having achieved remission: Secondary | ICD-10-CM

## 2012-09-28 DIAGNOSIS — D649 Anemia, unspecified: Secondary | ICD-10-CM | POA: Diagnosis not present

## 2012-09-28 LAB — CBC WITH DIFFERENTIAL/PLATELET
BASO%: 0.2 % (ref 0.0–2.0)
Basophils Absolute: 0.1 10*3/uL (ref 0.0–0.1)
EOS%: 0.5 % (ref 0.0–7.0)
Eosinophils Absolute: 0.3 10*3/uL (ref 0.0–0.5)
HCT: 36.2 % — ABNORMAL LOW (ref 38.4–49.9)
HGB: 12 g/dL — ABNORMAL LOW (ref 13.0–17.1)
LYMPH%: 89.8 % — ABNORMAL HIGH (ref 14.0–49.0)
MCH: 35 pg — ABNORMAL HIGH (ref 27.2–33.4)
MCHC: 33.2 g/dL (ref 32.0–36.0)
MCV: 105.4 fL — ABNORMAL HIGH (ref 79.3–98.0)
MONO#: 2.1 10*3/uL — ABNORMAL HIGH (ref 0.1–0.9)
MONO%: 3.1 % (ref 0.0–14.0)
NEUT#: 4.3 10*3/uL (ref 1.5–6.5)
NEUT%: 6.4 % — ABNORMAL LOW (ref 39.0–75.0)
Platelets: 100 10*3/uL — ABNORMAL LOW (ref 140–400)
RBC: 3.43 10*6/uL — ABNORMAL LOW (ref 4.20–5.82)
RDW: 14.2 % (ref 11.0–14.6)
WBC: 66.8 10*3/uL (ref 4.0–10.3)
lymph#: 60 10*3/uL — ABNORMAL HIGH (ref 0.9–3.3)

## 2012-09-28 LAB — COMPREHENSIVE METABOLIC PANEL (CC13)
ALT: 24 U/L (ref 0–55)
AST: 32 U/L (ref 5–34)
Albumin: 3.9 g/dL (ref 3.5–5.0)
Alkaline Phosphatase: 129 U/L (ref 40–150)
BUN: 19.5 mg/dL (ref 7.0–26.0)
CO2: 26 mEq/L (ref 22–29)
Calcium: 9.8 mg/dL (ref 8.4–10.4)
Chloride: 104 mEq/L (ref 98–109)
Creatinine: 1.5 mg/dL — ABNORMAL HIGH (ref 0.7–1.3)
Glucose: 129 mg/dl (ref 70–140)
Potassium: 4.5 mEq/L (ref 3.5–5.1)
Sodium: 139 mEq/L (ref 136–145)
Total Bilirubin: 0.58 mg/dL (ref 0.20–1.20)
Total Protein: 6.9 g/dL (ref 6.4–8.3)

## 2012-09-28 LAB — TECHNOLOGIST REVIEW

## 2012-09-28 NOTE — Telephone Encounter (Signed)
appts made and printed...td 

## 2012-09-28 NOTE — Patient Instructions (Addendum)
You are doing well, blood counts remain stable  I will see you back in 6 months  We will continue to monitor your cbc every 3 months

## 2012-09-28 NOTE — Progress Notes (Signed)
OFFICE PROGRESS NOTE  CC:  Jimmy Boyden, MD [address] [place] [address]  DIAGNOSIS: 79 year old gentleman with stage I chronic lymphocytic leukemia diagnosed 4 years ago  PRIOR THERAPY: Observation  CURRENT THERAPY: Observation  INTERVAL HISTORY: Jimmy [name] 79 y.o. male returns for a followup visit today. Overall patient is doing well. He remains pretty stable from his CLL point of view. He otherwise denies any fevers chills night sweats no recent hospitalizations no pneumonia is no bleeding or bruising his energy level is very stable. He remains very active. Remainder of the 10 point review of systems is negative. MEDICAL HISTORY: Past Medical History  Diagnosis Date  . CAD (coronary artery disease)     S/P CAB x 4 1/03; Myoview 08 EF 63% normal perfusion  . GERD (gastroesophageal reflux disease) 1990s  . Hyperlipemia 2002  . Hypertension 2002  . CLL (chronic lymphoblastic leukemia)     Dr. Welton Flakes  . Thrombocytopenia     Dr. Park Breed  . History of herpes genitalis     valtrex daily  . Carotid stenosis     R: 60-79%, stable (last [place] 09/2011)  . Diastolic CHF 2013    grade 1  . Aortic stenosis 2013    mild    ALLERGIES:  is allergic to new skin.  MEDICATIONS:  Current Outpatient Prescriptions  Medication Sig Dispense Refill  . aspirin 81 MG EC tablet Take 162 mg by mouth daily.        . diphenhydrAMINE (BENADRYL) 25 MG tablet Take 25 mg by mouth at bedtime as needed for itching.      [name] lisinopril (PRINIVIL,ZESTRIL) 40 MG tablet Take 1 tablet (40 mg total) by mouth daily.  90 tablet  3  . polyethylene glycol (MIRALAX / GLYCOLAX) packet Take 17 g by mouth daily as needed.      . simvastatin (ZOCOR) 20 MG tablet Take 1 tablet (20 mg total) by mouth at bedtime.  90 tablet  3  . valACYclovir (VALTREX) 500 MG tablet Take 1 tablet (500 mg total) by mouth daily.  90 tablet  3  . baclofen (LIORESAL) 10 MG tablet Take 1 tablet (10 mg total) by mouth 3  (three) times daily as needed (muscle spasm (sedation precautions)).  40 each  0  . tadalafil (CIALIS) 10 MG tablet Take 1 tablet (10 mg total) by mouth daily as needed for erectile dysfunction.  10 tablet  0   No current facility-administered medications for this visit.    SURGICAL HISTORY:  Past Surgical History  Procedure Laterality Date  . Hepatic artery angioplasty  [address], [place]  . Hemorroidectomy  1954    Fissure repair, [place]  . Kidney stone surgery  1977    Dr Jenne Pane  . Lithotripsy  1990s    Multiple  . Angioplasty  1998  . Coronary angioplasty  1998  . Coronary artery bypass graft  04/23/2001    x4, Dr. Nydia Bouton  . Esophagogastroduodenoscopy  11/29/1987    Gastritis  . Colonoscopy  11/29/1987    Normal  . Colonoscopy  02/07/2003    Hemm. Internal, focal proctitis, negative biopsy  . Colonoscopy  12/12/2004    Internal external hemorrhoids, +proctits, negative biopsy  . Cataract extraction, bilateral      R 1/09, L 8/09  . Ett  12/10/2006    Persantine myoview nml  . US echocardiography  07/2011    nl sys fxn, EF 55-60%, grade 1 diast  dysfunction, mild AS, mildly elevated PA pressure    REVIEW OF SYSTEMS:  Pertinent items are noted in HPI.     ECOG PERFORMANCE STATUS: 1 - Symptomatic but completely ambulatory  Blood pressure 119/64, pulse 73, temperature 97.8 F (36.6 C), temperature source Oral, resp. rate 20, height 5' 9.25" (1.759 m), weight 161 lb 14.4 oz (73.437 kg).  HEENT exam EOMI PERRLA sclerae anicteric no conjunctival pallor oral mucosa is moist no posterior pharyngeal wall erythema neck is supple there is palpable shotty lymph nodes in right axilla and cervical regions. Lungs: Clear to auscultation and percussion cardiovascular: Regular rate rhythm no murmurs abdomen soft nontender nondistended bowel sounds are present no hepatomegaly, active spleen is palpable below the left costal margin extremities: Petechiae noted otherwise no cyanosis  clubbing or edema  LABORATORY DATA: Lab Results  Component Value Date   WBC 66.8* 09/28/2012   HGB 12.0* 09/28/2012   HCT 36.2* 09/28/2012   MCV 105.4* 09/28/2012   PLT 100* 09/28/2012      RADIOGRAPHIC STUDIES:  No results found.  ASSESSMENT: 79 year old gentleman with stage I chronic lymphocytic leukemia.patient overall is doing well. He overall remains pretty stable. At this time we'll continue to follow him  PLAN: #1 patient's CLL is stable there is no need for any kind of treatment at this time. He is doing very well with observation only.   #2 CBC q 3 months   #3 I will plan on seeing the patient back in 6 months time  All questions were answered. The patient knows to call the clinic with any problems, questions or concerns. We can certainly see the patient much sooner if necessary.  I spent 15 minutes counseling the patient face to face. The total time spent in the appointment was 30 minutes.    Drue Second, MD Medical/Oncology [facility] [phone] (beeper) [phone] (Office)  09/28/2012, 9:18 AM

## 2012-09-29 LAB — DIRECT ANTIGLOBULIN TEST (NOT AT ARMC)
DAT (Complement): NEGATIVE
DAT IgG: NEGATIVE

## 2012-09-29 LAB — HAPTOGLOBIN: Haptoglobin: 91 mg/dL (ref 45–215)

## 2012-09-29 LAB — IGG, IGA, IGM
IgA: 125 mg/dL (ref 68–379)
IgG (Immunoglobin G), Serum: 992 mg/dL (ref 650–1600)
IgM, Serum: 93 mg/dL (ref 41–251)

## 2012-10-03 ENCOUNTER — Other Ambulatory Visit: Payer: Self-pay | Admitting: Family Medicine

## 2012-12-01 ENCOUNTER — Other Ambulatory Visit (HOSPITAL_BASED_OUTPATIENT_CLINIC_OR_DEPARTMENT_OTHER): Payer: Medicare Other | Admitting: Lab

## 2012-12-01 ENCOUNTER — Telehealth: Payer: Self-pay | Admitting: Medical Oncology

## 2012-12-01 DIAGNOSIS — C9110 Chronic lymphocytic leukemia of B-cell type not having achieved remission: Secondary | ICD-10-CM

## 2012-12-01 LAB — CBC WITH DIFFERENTIAL/PLATELET
BASO%: 0.2 % (ref 0.0–2.0)
Basophils Absolute: 0.1 10*3/uL (ref 0.0–0.1)
EOS%: 0.5 % (ref 0.0–7.0)
Eosinophils Absolute: 0.3 10*3/uL (ref 0.0–0.5)
HCT: 36.2 % — ABNORMAL LOW (ref 38.4–49.9)
HGB: 12 g/dL — ABNORMAL LOW (ref 13.0–17.1)
LYMPH%: 88.5 % — ABNORMAL HIGH (ref 14.0–49.0)
MCH: 34.9 pg — ABNORMAL HIGH (ref 27.2–33.4)
MCHC: 33.2 g/dL (ref 32.0–36.0)
MCV: 105.2 fL — ABNORMAL HIGH (ref 79.3–98.0)
MONO#: 1.6 10*3/uL — ABNORMAL HIGH (ref 0.1–0.9)
MONO%: 2.5 % (ref 0.0–14.0)
NEUT#: 5.4 10*3/uL (ref 1.5–6.5)
NEUT%: 8.3 % — ABNORMAL LOW (ref 39.0–75.0)
Platelets: 89 10*3/uL — ABNORMAL LOW (ref 140–400)
RBC: 3.44 10*6/uL — ABNORMAL LOW (ref 4.20–5.82)
RDW: 14.1 % (ref 11.0–14.6)
WBC: 64.9 10*3/uL (ref 4.0–10.3)
lymph#: 57.4 10*3/uL — ABNORMAL HIGH (ref 0.9–3.3)

## 2012-12-01 LAB — TECHNOLOGIST REVIEW

## 2012-12-01 NOTE — Telephone Encounter (Signed)
Per MD informed patient lab results and cancer stable, per MD will continue to monitor. Patient gave verbal understanding, denies questions at this time.

## 2012-12-09 ENCOUNTER — Encounter (INDEPENDENT_AMBULATORY_CARE_PROVIDER_SITE_OTHER): Payer: Medicare Other

## 2012-12-09 DIAGNOSIS — I6529 Occlusion and stenosis of unspecified carotid artery: Secondary | ICD-10-CM

## 2013-01-07 ENCOUNTER — Ambulatory Visit (INDEPENDENT_AMBULATORY_CARE_PROVIDER_SITE_OTHER): Payer: Medicare Other

## 2013-01-07 DIAGNOSIS — Z23 Encounter for immunization: Secondary | ICD-10-CM

## 2013-03-02 ENCOUNTER — Other Ambulatory Visit (HOSPITAL_BASED_OUTPATIENT_CLINIC_OR_DEPARTMENT_OTHER): Payer: Medicare Other | Admitting: Lab

## 2013-03-02 DIAGNOSIS — C9110 Chronic lymphocytic leukemia of B-cell type not having achieved remission: Secondary | ICD-10-CM

## 2013-03-02 LAB — CBC WITH DIFFERENTIAL/PLATELET
BASO%: 0.2 % (ref 0.0–2.0)
Basophils Absolute: 0.1 10*3/uL (ref 0.0–0.1)
EOS%: 0.4 % (ref 0.0–7.0)
Eosinophils Absolute: 0.2 10*3/uL (ref 0.0–0.5)
HCT: 36 % — ABNORMAL LOW (ref 38.4–49.9)
HGB: 11.6 g/dL — ABNORMAL LOW (ref 13.0–17.1)
LYMPH%: 90 % — ABNORMAL HIGH (ref 14.0–49.0)
MCH: 34.1 pg — ABNORMAL HIGH (ref 27.2–33.4)
MCHC: 32.3 g/dL (ref 32.0–36.0)
MCV: 105.7 fL — ABNORMAL HIGH (ref 79.3–98.0)
MONO#: 0.9 10*3/uL (ref 0.1–0.9)
MONO%: 1.6 % (ref 0.0–14.0)
NEUT#: 4.4 10*3/uL (ref 1.5–6.5)
NEUT%: 7.8 % — ABNORMAL LOW (ref 39.0–75.0)
Platelets: 80 10*3/uL — ABNORMAL LOW (ref 140–400)
RBC: 3.41 10*6/uL — ABNORMAL LOW (ref 4.20–5.82)
RDW: 14.3 % (ref 11.0–14.6)
WBC: 57 10*3/uL (ref 4.0–10.3)
lymph#: 51.4 10*3/uL — ABNORMAL HIGH (ref 0.9–3.3)

## 2013-03-02 LAB — TECHNOLOGIST REVIEW

## 2013-04-01 DIAGNOSIS — Z8673 Personal history of transient ischemic attack (TIA), and cerebral infarction without residual deficits: Secondary | ICD-10-CM

## 2013-04-01 DIAGNOSIS — R220 Localized swelling, mass and lump, head: Secondary | ICD-10-CM

## 2013-04-01 HISTORY — DX: Localized swelling, mass and lump, head: R22.0

## 2013-04-01 HISTORY — DX: Personal history of transient ischemic attack (TIA), and cerebral infarction without residual deficits: Z86.73

## 2013-04-02 ENCOUNTER — Other Ambulatory Visit: Payer: Medicare Other | Admitting: Lab

## 2013-04-02 ENCOUNTER — Ambulatory Visit: Payer: Medicare Other | Admitting: Oncology

## 2013-04-09 ENCOUNTER — Encounter: Payer: Self-pay | Admitting: Oncology

## 2013-04-09 ENCOUNTER — Telehealth: Payer: Self-pay | Admitting: Oncology

## 2013-04-09 ENCOUNTER — Ambulatory Visit (HOSPITAL_BASED_OUTPATIENT_CLINIC_OR_DEPARTMENT_OTHER): Payer: Medicare Other | Admitting: Oncology

## 2013-04-09 ENCOUNTER — Other Ambulatory Visit (HOSPITAL_BASED_OUTPATIENT_CLINIC_OR_DEPARTMENT_OTHER): Payer: Medicare Other

## 2013-04-09 VITALS — BP 153/73 | HR 70 | Temp 98.2°F | Resp 18 | Ht 69.0 in | Wt 162.8 lb

## 2013-04-09 DIAGNOSIS — R161 Splenomegaly, not elsewhere classified: Secondary | ICD-10-CM

## 2013-04-09 DIAGNOSIS — R1012 Left upper quadrant pain: Secondary | ICD-10-CM | POA: Diagnosis not present

## 2013-04-09 DIAGNOSIS — C9110 Chronic lymphocytic leukemia of B-cell type not having achieved remission: Secondary | ICD-10-CM | POA: Diagnosis not present

## 2013-04-09 DIAGNOSIS — D696 Thrombocytopenia, unspecified: Secondary | ICD-10-CM

## 2013-04-09 LAB — COMPREHENSIVE METABOLIC PANEL (CC13)
ALT: 27 U/L (ref 0–55)
AST: 38 U/L — ABNORMAL HIGH (ref 5–34)
Albumin: 4.1 g/dL (ref 3.5–5.0)
Alkaline Phosphatase: 162 U/L — ABNORMAL HIGH (ref 40–150)
Anion Gap: 10 meq/L (ref 3–11)
BUN: 18.2 mg/dL (ref 7.0–26.0)
CO2: 24 meq/L (ref 22–29)
Calcium: 9.9 mg/dL (ref 8.4–10.4)
Chloride: 107 meq/L (ref 98–109)
Creatinine: 1.5 mg/dL — ABNORMAL HIGH (ref 0.7–1.3)
Glucose: 110 mg/dL (ref 70–140)
Potassium: 4.4 meq/L (ref 3.5–5.1)
Sodium: 141 meq/L (ref 136–145)
Total Bilirubin: 0.71 mg/dL (ref 0.20–1.20)
Total Protein: 7.4 g/dL (ref 6.4–8.3)

## 2013-04-09 LAB — CBC WITH DIFFERENTIAL/PLATELET
BASO%: 0.2 % (ref 0.0–2.0)
Basophils Absolute: 0.1 10e3/uL (ref 0.0–0.1)
EOS%: 0.4 % (ref 0.0–7.0)
Eosinophils Absolute: 0.2 10e3/uL (ref 0.0–0.5)
HCT: 36 % — ABNORMAL LOW (ref 38.4–49.9)
HGB: 11.9 g/dL — ABNORMAL LOW (ref 13.0–17.1)
LYMPH%: 89.8 % — ABNORMAL HIGH (ref 14.0–49.0)
MCH: 34.8 pg — ABNORMAL HIGH (ref 27.2–33.4)
MCHC: 33.2 g/dL (ref 32.0–36.0)
MCV: 104.8 fL — ABNORMAL HIGH (ref 79.3–98.0)
MONO#: 1.3 10e3/uL — ABNORMAL HIGH (ref 0.1–0.9)
MONO%: 2.3 % (ref 0.0–14.0)
NEUT#: 4.2 10e3/uL (ref 1.5–6.5)
NEUT%: 7.3 % — ABNORMAL LOW (ref 39.0–75.0)
Platelets: 81 10e3/uL — ABNORMAL LOW (ref 140–400)
RBC: 3.44 10e6/uL — ABNORMAL LOW (ref 4.20–5.82)
RDW: 14.5 % (ref 11.0–14.6)
WBC: 57.4 10e3/uL (ref 4.0–10.3)
lymph#: 51.5 10e3/uL — ABNORMAL HIGH (ref 0.9–3.3)

## 2013-04-09 LAB — TECHNOLOGIST REVIEW

## 2013-04-09 NOTE — Progress Notes (Signed)
OFFICE PROGRESS NOTE  CC:  Jimmy Bush, MD [place] [address]  DIAGNOSIS: 80 year old gentleman with stage I chronic lymphocytic leukemia diagnosed 4 years ago  PRIOR THERAPY: Observation  CURRENT THERAPY: Observation  INTERVAL HISTORY: Jimmy [name] 80 y.o. male returns for a followup visit today. Overall patient is doing well. He remains pretty stable from his CLL point of view. He does have some abdominal pain in the left upper quadrant. I am able to palpate the spleen which is different than his prior examinations. There is no palpable peripheral adenopathy. He otherwise denies any fevers chills night sweats no recent hospitalizations no pneumonia is no bleeding or bruising his energy level is very stable. He remains very active. Remainder of the 10 point review of systems is negative.  MEDICAL HISTORY: Past Medical History  Diagnosis Date  . CAD (coronary artery disease)     S/P CAB x 4 1/03; Myoview 08 EF 63% normal perfusion  . GERD (gastroesophageal reflux disease) 1990s  . Hyperlipemia 2002  . Hypertension 2002  . CLL (chronic lymphoblastic leukemia)     Dr. Humphrey Rolls  . Thrombocytopenia     Dr. Chancy Milroy  . History of herpes genitalis     valtrex daily  . Carotid stenosis     R: 60-79%, stable (last [place] 09/2011)  . Diastolic CHF 8413    grade 1  . Aortic stenosis 2013    mild    ALLERGIES:  is allergic to new skin.  MEDICATIONS:  Current Outpatient Prescriptions  Medication Sig Dispense Refill  . aspirin 81 MG EC tablet Take 162 mg by mouth daily.        . baclofen (LIORESAL) 10 MG tablet Take 1 tablet (10 mg total) by mouth 3 (three) times daily as needed (muscle spasm (sedation precautions)).  40 each  0  . diphenhydrAMINE (BENADRYL) 25 MG tablet Take 25 mg by mouth at bedtime as needed for itching.      [name] lisinopril (PRINIVIL,ZESTRIL) 40 MG tablet Take 1 tablet (40 mg total) by mouth daily.  90 tablet  3  . polyethylene glycol  (MIRALAX / GLYCOLAX) packet Take 17 g by mouth daily as needed.      . simvastatin (ZOCOR) 20 MG tablet TAKE 1 TABLET AT BEDTIME  90 tablet  3  . valACYclovir (VALTREX) 500 MG tablet Take 1 tablet (500 mg total) by mouth daily.  90 tablet  3  . tadalafil (CIALIS) 10 MG tablet Take 1 tablet (10 mg total) by mouth daily as needed for erectile dysfunction.  10 tablet  0   No current facility-administered medications for this visit.    SURGICAL HISTORY:  Past Surgical History  Procedure Laterality Date  . Hepatic artery angioplasty  [address], [place]  . Hemorroidectomy  1954    Fissure repair, [place]  . Kidney stone surgery  1977    Dr Redmond Baseman  . Lithotripsy  1990s    Multiple  . Angioplasty  1998  . Coronary angioplasty  1998  . Coronary artery bypass graft  04/23/2001    x4, Dr. Pia Mau  . Esophagogastroduodenoscopy  11/29/1987    Gastritis  . Colonoscopy  11/29/1987    Normal  . Colonoscopy  02/07/2003    Hemm. Internal, focal proctitis, negative biopsy  . Colonoscopy  12/12/2004    Internal external hemorrhoids, +proctits, negative biopsy  . Cataract extraction, bilateral      R 1/09, L 8/09  .  Ett  12/10/2006    Persantine myoview nml  . US echocardiography  07/2011    nl sys fxn, EF 55-60%, grade 1 diast dysfunction, mild AS, mildly elevated PA pressure    REVIEW OF SYSTEMS:  Pertinent items are noted in HPI.     ECOG PERFORMANCE STATUS: 1 - Symptomatic but completely ambulatory  Blood pressure 153/73, pulse 70, temperature 98.2 F (36.8 C), temperature source Oral, resp. rate 18, height 5\' 9"  (1.753 m), weight 162 lb 12.8 oz (73.846 kg).  HEENT exam EOMI PERRLA sclerae anicteric no conjunctival pallor oral mucosa is moist no posterior pharyngeal wall erythema neck is supple there is palpable shotty lymph nodes in right axilla and cervical regions. Lungs: Clear to auscultation and percussion cardiovascular: Regular rate rhythm no murmurs abdomen soft nontender  nondistended bowel sounds are present no hepatomegaly, active spleen is palpable below the left costal margin and tender mass in the left upper quadrant extremities: Petechiae noted otherwise no cyanosis clubbing or edema  LABORATORY DATA: Lab Results  Component Value Date   WBC 57.4* 04/09/2013   HGB 11.9* 04/09/2013   HCT 36.0* 04/09/2013   MCV 104.8* 04/09/2013   PLT 81* 04/09/2013      RADIOGRAPHIC STUDIES:  No results found.  ASSESSMENT: 80 year old gentleman with stage I chronic lymphocytic leukemia.patient overall is doing well. He overall remains pretty stable. At this time we'll continue to follow him  PLAN: #1  CLL seem to be 3 stable.  #2 splenomegaly: [name] is tender we will plan on doing scans for further evaluation of splenomegaly as well as lymphadenopathy with possible progression of CLL.  #3 thrombocytopenia likely secondary to CLL.  #4 patient will be seen back in a few months time for followup or sooner if need arises   All questions were answered. The patient knows to call the clinic with any problems, questions or concerns. We can certainly see the patient much sooner if necessary.  I spent 15 minutes counseling the patient face to face. The total time spent in the appointment was 30 minutes.    Marcy Panning, MD Medical/Oncology [facility] [phone] (beeper) [phone] (Office)  04/09/2013, 1:47 PM

## 2013-04-09 NOTE — Patient Instructions (Signed)
Enlarged Spleen The spleen is an organ located in the upper abdomen under your left ribs. It is a spongelike organ, about the size of an orange, which acts as a filter. The spleen is part of the lymph system and filters the blood. It removes old blood cells and abnormal blood cells. It is also part of the immune response and helps fight infections. An enlarged spleen (splenomegaly) is usually noticed when it is almost twice its normal size. CAUSES  There are many possible causes of an enlarged spleen. These causes include:  Infections (viral, bacterial, or parasitic).  Liver cirrhosis and other liver diseases.  Hemolytic anemia (types of anemia that lower your red blood cell count) and other blood diseases.  Hypersplenism (reduction in many types of blood cells by an enlarged spleen).  Blood cancers (leukemia, Hodgkin's disease).  Metabolic disorders (Gaucher's disease, Niemann-Pick disease).  Tumors and cysts.  Pressure or blood clots in the veins of the spleen.  Connective tissue disorders (lupus, rheumatoid arthritis with Felty's syndrome). SYMPTOMS  An enlarged spleen may not always cause symptoms. If symptoms do occur, they may include:  Pain in the upper left abdomen (pain may spread to the left shoulder or get worse when you take a breath).  Feeling full without eating or eating only a small amount.  Feeling tired.  Chronic infections.  Bleeding easily. DIAGNOSIS  Tests may include:  Physical examination of the left upper abdomen.  Blood tests to check red and white blood cells and other proteins and enzymes.  Imaging tests, such as abdominal ultrasonography, computerized X-ray scan (computed tomography, CT), and computerized magnetic scan (magnetic resonance imaging, MRI).  Taking a tissue sample (biopsy) of the liver to examine it.  Examining a bone marrow biopsy sample. TREATMENT  Treatment varies depending on the cause of the enlarged spleen. Treatment aims  to manage the conditions that cause swelling of the spleen and reduce the size of the spleen. Treatment may include:  Medications to eliminate infection or treat disease.  Radiation therapy.  Blood transfusions.  Vaccinations. If these treatments are not successful, or the cause cannot be determined, surgery to remove the spleen (splenectomy) may be recommended. HOME CARE INSTRUCTIONS   Take all medications as directed.  Take all antibiotics, even if you start to feel better. Discuss with your caregiver the use of a probiotic supplement to prevent stomach upset.  To avoid injury or a ruptured spleen:  Limit activities as directed.  Avoid contact sports.  Wear your seatbelt in the car.  See your caregiver for vaccinations, follow up examinations and testing as directed.  Follow all of your caregiver's instructions on managing the conditions that cause your enlarged spleen. PREVENTION  It is not always possible to prevent an enlarged spleen. Reduce your chances of developing an enlarged spleen:  Practice good hygiene to prevent infection.  Get recommended vaccines to prevent infection. SEEK MEDICAL CARE IF:   You develop a fever (more than 100.37F [38.1 C]) or other signs of infection (chills, feeling unwell).  You experience injury or impact to the spleen area.  Your symptoms do not go away as you and your doctor expected.  You experience increased pain when you take in a breath.  Your symptoms worsen, or you develop new symptoms. MAKE SURE YOU:   Understand these instructions.  Will watch your condition.  Will get help right away if you are not doing well or get worse. Follow up with your caregiver to find out the results  of your tests. Not all test results may be available during your visit. If your test results are not back during the visit, make an appointment with your caregiver to find out the results. Do not assume everything is normal if you have not heard  from your caregiver or the medical facility. It is important for you to follow up on all of your test results.  °Document Released: 09/05/2009 Document Revised: 06/10/2011 Document Reviewed: 09/05/2009 °Exit[employer]® Patient Information ©2014 [employer]. ° °

## 2013-04-09 NOTE — Telephone Encounter (Signed)
, °

## 2013-04-10 LAB — IGG, IGA, IGM
IgA: 132 mg/dL (ref 68–379)
IgG (Immunoglobin G), Serum: 955 mg/dL (ref 650–1600)
IgM, Serum: 118 mg/dL (ref 41–251)

## 2013-04-10 LAB — HAPTOGLOBIN: Haptoglobin: 88 mg/dL (ref 45–215)

## 2013-04-20 ENCOUNTER — Ambulatory Visit (HOSPITAL_COMMUNITY)
Admission: RE | Admit: 2013-04-20 | Discharge: 2013-04-20 | Disposition: A | Discharge status: Home / Self Care | Payer: Medicare Other | Source: Ambulatory Visit | Attending: Oncology | Admitting: Oncology

## 2013-04-20 DIAGNOSIS — Z951 Presence of aortocoronary bypass graft: Secondary | ICD-10-CM | POA: Insufficient documentation

## 2013-04-20 DIAGNOSIS — K769 Liver disease, unspecified: Secondary | ICD-10-CM | POA: Diagnosis not present

## 2013-04-20 DIAGNOSIS — N281 Cyst of kidney, acquired: Secondary | ICD-10-CM | POA: Insufficient documentation

## 2013-04-20 DIAGNOSIS — R109 Unspecified abdominal pain: Secondary | ICD-10-CM | POA: Diagnosis not present

## 2013-04-20 DIAGNOSIS — R911 Solitary pulmonary nodule: Secondary | ICD-10-CM | POA: Insufficient documentation

## 2013-04-20 DIAGNOSIS — I700 Atherosclerosis of aorta: Secondary | ICD-10-CM | POA: Insufficient documentation

## 2013-04-20 DIAGNOSIS — I2510 Atherosclerotic heart disease of native coronary artery without angina pectoris: Secondary | ICD-10-CM | POA: Diagnosis not present

## 2013-04-20 DIAGNOSIS — C9110 Chronic lymphocytic leukemia of B-cell type not having achieved remission: Secondary | ICD-10-CM | POA: Insufficient documentation

## 2013-04-20 DIAGNOSIS — R599 Enlarged lymph nodes, unspecified: Secondary | ICD-10-CM | POA: Insufficient documentation

## 2013-04-20 DIAGNOSIS — R161 Splenomegaly, not elsewhere classified: Secondary | ICD-10-CM | POA: Diagnosis not present

## 2013-04-20 DIAGNOSIS — M47812 Spondylosis without myelopathy or radiculopathy, cervical region: Secondary | ICD-10-CM | POA: Diagnosis not present

## 2013-04-20 DIAGNOSIS — M47817 Spondylosis without myelopathy or radiculopathy, lumbosacral region: Secondary | ICD-10-CM | POA: Diagnosis not present

## 2013-04-20 DIAGNOSIS — J438 Other emphysema: Secondary | ICD-10-CM | POA: Diagnosis not present

## 2013-04-20 MED ORDER — IOHEXOL 300 MG/ML  SOLN
100.0000 mL | Freq: Once | INTRAMUSCULAR | Status: AC | PRN
  Administered 2013-04-20: 100 mL via INTRAVENOUS

## 2013-04-27 ENCOUNTER — Other Ambulatory Visit: Payer: Self-pay | Admitting: Family Medicine

## 2013-04-27 MED ORDER — LISINOPRIL 40 MG PO TABS: 40.0000 mg | ORAL_TABLET | Freq: Every day | ORAL | Status: DC

## 2013-05-04 ENCOUNTER — Telehealth: Payer: Self-pay | Admitting: *Deleted

## 2013-05-04 ENCOUNTER — Ambulatory Visit (HOSPITAL_BASED_OUTPATIENT_CLINIC_OR_DEPARTMENT_OTHER): Payer: Medicare Other | Admitting: Oncology

## 2013-05-04 ENCOUNTER — Other Ambulatory Visit (HOSPITAL_BASED_OUTPATIENT_CLINIC_OR_DEPARTMENT_OTHER): Payer: Medicare Other

## 2013-05-04 ENCOUNTER — Encounter: Payer: Self-pay | Admitting: Oncology

## 2013-05-04 VITALS — BP 107/64 | HR 71 | Temp 97.9°F | Resp 18 | Ht 69.0 in | Wt 163.5 lb

## 2013-05-04 DIAGNOSIS — C9510 Chronic leukemia of unspecified cell type not having achieved remission: Secondary | ICD-10-CM | POA: Diagnosis not present

## 2013-05-04 DIAGNOSIS — R161 Splenomegaly, not elsewhere classified: Secondary | ICD-10-CM

## 2013-05-04 DIAGNOSIS — C9110 Chronic lymphocytic leukemia of B-cell type not having achieved remission: Secondary | ICD-10-CM | POA: Diagnosis not present

## 2013-05-04 DIAGNOSIS — D696 Thrombocytopenia, unspecified: Secondary | ICD-10-CM | POA: Diagnosis not present

## 2013-05-04 LAB — COMPREHENSIVE METABOLIC PANEL (CC13)
ALT: 32 U/L (ref 0–55)
AST: 39 U/L — ABNORMAL HIGH (ref 5–34)
Albumin: 4.1 g/dL (ref 3.5–5.0)
Alkaline Phosphatase: 154 U/L — ABNORMAL HIGH (ref 40–150)
Anion Gap: 7 mEq/L (ref 3–11)
BUN: 30 mg/dL — ABNORMAL HIGH (ref 7.0–26.0)
CO2: 26 mEq/L (ref 22–29)
Calcium: 10 mg/dL (ref 8.4–10.4)
Chloride: 106 mEq/L (ref 98–109)
Creatinine: 1.6 mg/dL — ABNORMAL HIGH (ref 0.7–1.3)
Glucose: 102 mg/dl (ref 70–140)
Potassium: 4.8 mEq/L (ref 3.5–5.1)
Sodium: 139 mEq/L (ref 136–145)
Total Bilirubin: 0.55 mg/dL (ref 0.20–1.20)
Total Protein: 6.8 g/dL (ref 6.4–8.3)

## 2013-05-04 LAB — CBC WITH DIFFERENTIAL/PLATELET
BASO%: 0.1 % (ref 0.0–2.0)
Basophils Absolute: 0 10*3/uL (ref 0.0–0.1)
EOS%: 0.5 % (ref 0.0–7.0)
Eosinophils Absolute: 0.2 10*3/uL (ref 0.0–0.5)
HCT: 36.1 % — ABNORMAL LOW (ref 38.4–49.9)
HGB: 11.9 g/dL — ABNORMAL LOW (ref 13.0–17.1)
LYMPH%: 85.8 % — ABNORMAL HIGH (ref 14.0–49.0)
MCH: 34.2 pg — ABNORMAL HIGH (ref 27.2–33.4)
MCHC: 32.9 g/dL (ref 32.0–36.0)
MCV: 104.1 fL — ABNORMAL HIGH (ref 79.3–98.0)
MONO#: 1.7 10*3/uL — ABNORMAL HIGH (ref 0.1–0.9)
MONO%: 4.2 % (ref 0.0–14.0)
NEUT#: 3.8 10*3/uL (ref 1.5–6.5)
NEUT%: 9.4 % — ABNORMAL LOW (ref 39.0–75.0)
Platelets: 91 10*3/uL — ABNORMAL LOW (ref 140–400)
RBC: 3.47 10*6/uL — ABNORMAL LOW (ref 4.20–5.82)
RDW: 14.3 % (ref 11.0–14.6)
WBC: 40.8 10*3/uL — ABNORMAL HIGH (ref 4.0–10.3)
lymph#: 35 10*3/uL — ABNORMAL HIGH (ref 0.9–3.3)

## 2013-05-04 LAB — TECHNOLOGIST REVIEW

## 2013-05-04 LAB — LACTATE DEHYDROGENASE (CC13): LDH: 285 U/L — ABNORMAL HIGH (ref 125–245)

## 2013-05-04 NOTE — Progress Notes (Signed)
OFFICE PROGRESS NOTE  CC:  Ria Bush, MD [place] [address]  DIAGNOSIS: 80 year old gentleman with stage I chronic lymphocytic leukemia diagnosed 4 years ago  PRIOR THERAPY: Observation  CURRENT THERAPY: Observation  INTERVAL HISTORY: Jimmy [name] 80 y.o. male returns for a followup visit today. Overall patient is doing well. He remains pretty stable from his CLL point of view. The spleen is still palpable without any tenderness. Today he is accompanied by his wife.. There is no palpable peripheral adenopathy. He otherwise denies any fevers chills night sweats no recent hospitalizations no pneumonia is no bleeding or bruising his energy level is very stable. He remains very active. Remainder of the 10 point review of systems is negative.  MEDICAL HISTORY: Past Medical History  Diagnosis Date  . CAD (coronary artery disease)     S/P CAB x 4 1/03; Myoview 08 EF 63% normal perfusion  . GERD (gastroesophageal reflux disease) 1990s  . Hyperlipemia 2002  . Hypertension 2002  . CLL (chronic lymphoblastic leukemia)     Dr. Humphrey Rolls  . Thrombocytopenia     Dr. Chancy Milroy  . History of herpes genitalis     valtrex daily  . Carotid stenosis     R: 60-79%, stable (last [place] 09/2011)  . Diastolic CHF 7782    grade 1  . Aortic stenosis 2013    mild    ALLERGIES:  is allergic to new skin.  MEDICATIONS:  Current Outpatient Prescriptions  Medication Sig Dispense Refill  . aspirin 81 MG EC tablet Take 162 mg by mouth daily.        . diphenhydrAMINE (BENADRYL) 25 MG tablet Take 25 mg by mouth at bedtime as needed for itching.      [name] lisinopril (PRINIVIL,ZESTRIL) 40 MG tablet Take 1 tablet (40 mg total) by mouth daily.  90 tablet  1  . polyethylene glycol (MIRALAX / GLYCOLAX) packet Take 17 g by mouth daily as needed.      . simvastatin (ZOCOR) 20 MG tablet TAKE 1 TABLET AT BEDTIME  90 tablet  3  . valACYclovir (VALTREX) 500 MG tablet Take 1 tablet (500 mg total)  by mouth daily.  90 tablet  3  . baclofen (LIORESAL) 10 MG tablet Take 1 tablet (10 mg total) by mouth 3 (three) times daily as needed (muscle spasm (sedation precautions)).  40 each  0   No current facility-administered medications for this visit.    SURGICAL HISTORY:  Past Surgical History  Procedure Laterality Date  . Hepatic artery angioplasty  [address] [place]  . Hemorroidectomy  1954    Fissure repair, [place]  . Kidney stone surgery  1977    Dr Redmond Baseman  . Lithotripsy  1990s    Multiple  . Angioplasty  1998  . Coronary angioplasty  1998  . Coronary artery bypass graft  04/23/2001    x4, Dr. Pia Mau  . Esophagogastroduodenoscopy  11/29/1987    Gastritis  . Colonoscopy  11/29/1987    Normal  . Colonoscopy  02/07/2003    Hemm. Internal, focal proctitis, negative biopsy  . Colonoscopy  12/12/2004    Internal external hemorrhoids, +proctits, negative biopsy  . Cataract extraction, bilateral      R 1/09, L 8/09  . Ett  12/10/2006    Persantine myoview nml  . US echocardiography  07/2011    nl sys fxn, EF 55-60%, grade 1 diast dysfunction, mild AS, mildly elevated PA pressure  REVIEW OF SYSTEMS:  Pertinent items are noted in HPI.     ECOG PERFORMANCE STATUS: 1 - Symptomatic but completely ambulatory  Blood pressure 107/64, pulse 71, temperature 97.9 F (36.6 C), temperature source Oral, resp. rate 18, height 5\' 9"  (1.753 m), weight 163 lb 8 oz (74.163 kg).  HEENT exam EOMI PERRLA sclerae anicteric no conjunctival pallor oral mucosa is moist no posterior pharyngeal wall erythema neck is supple there is palpable shotty lymph nodes in right axilla and cervical regions. Lungs: Clear to auscultation and percussion cardiovascular: Regular rate rhythm no murmurs abdomen soft nontender nondistended bowel sounds are present no hepatomegaly, active spleen is palpable below the left costal margin and tender mass in the left upper quadrant extremities: Petechiae noted otherwise  no cyanosis clubbing or edema  LABORATORY DATA: Lab Results  Component Value Date   WBC 40.8* 05/04/2013   HGB 11.9* 05/04/2013   HCT 36.1* 05/04/2013   MCV 104.1* 05/04/2013   PLT 91* 05/04/2013      RADIOGRAPHIC STUDIES:  No results found.  ASSESSMENT: 80 year old gentleman with stage I chronic lymphocytic leukemia.patient overall is doing well. He overall remains pretty stable. At this time we'll continue to follow him  PLAN: #1  CLL: Staging scans revealed splenomegaly but otherwise minimal adenopathy. His white count is also down. Clinically he seems stable.  #2 splenomegaly: monitor  #3 thrombocytopenia likely secondary to CLL.  #4 patient will be seen back in a few months time for followup or sooner if need arises   All questions were answered. The patient knows to call the clinic with any problems, questions or concerns. We can certainly see the patient much sooner if necessary.  I spent 15 minutes counseling the patient face to face. The total time spent in the appointment was 30 minutes.    Marcy Panning, MD Medical/Oncology [facility] [phone] (beeper) [phone] (Office)  05/04/2013, 12:00 PM

## 2013-05-04 NOTE — Telephone Encounter (Signed)
appts made and printed...td 

## 2013-05-05 LAB — IGG, IGA, IGM
IgA: 100 mg/dL (ref 68–379)
IgG (Immunoglobin G), Serum: 1060 mg/dL (ref 650–1600)
IgM, Serum: 77 mg/dL (ref 41–251)

## 2013-05-05 LAB — HAPTOGLOBIN: Haptoglobin: 89 mg/dL (ref 45–215)

## 2013-05-05 LAB — DIRECT ANTIGLOBULIN TEST (NOT AT ARMC)
DAT (Complement): NEGATIVE
DAT IgG: NEGATIVE

## 2013-06-01 ENCOUNTER — Other Ambulatory Visit (HOSPITAL_BASED_OUTPATIENT_CLINIC_OR_DEPARTMENT_OTHER): Payer: Medicare Other

## 2013-06-01 DIAGNOSIS — C9110 Chronic lymphocytic leukemia of B-cell type not having achieved remission: Secondary | ICD-10-CM

## 2013-06-01 LAB — CBC WITH DIFFERENTIAL/PLATELET
BASO%: 0.1 % (ref 0.0–2.0)
Basophils Absolute: 0.1 10*3/uL (ref 0.0–0.1)
EOS%: 0.7 % (ref 0.0–7.0)
Eosinophils Absolute: 0.3 10*3/uL (ref 0.0–0.5)
HCT: 35.7 % — ABNORMAL LOW (ref 38.4–49.9)
HGB: 11.5 g/dL — ABNORMAL LOW (ref 13.0–17.1)
LYMPH%: 87.3 % — ABNORMAL HIGH (ref 14.0–49.0)
MCH: 33.7 pg — ABNORMAL HIGH (ref 27.2–33.4)
MCHC: 32.3 g/dL (ref 32.0–36.0)
MCV: 104.4 fL — ABNORMAL HIGH (ref 79.3–98.0)
MONO#: 1.4 10*3/uL — ABNORMAL HIGH (ref 0.1–0.9)
MONO%: 3.2 % (ref 0.0–14.0)
NEUT#: 3.9 10*3/uL (ref 1.5–6.5)
NEUT%: 8.7 % — ABNORMAL LOW (ref 39.0–75.0)
Platelets: 78 10*3/uL — ABNORMAL LOW (ref 140–400)
RBC: 3.42 10*6/uL — ABNORMAL LOW (ref 4.20–5.82)
RDW: 14.2 % (ref 11.0–14.6)
WBC: 44.3 10*3/uL — ABNORMAL HIGH (ref 4.0–10.3)
lymph#: 38.6 10*3/uL — ABNORMAL HIGH (ref 0.9–3.3)

## 2013-06-01 LAB — TECHNOLOGIST REVIEW

## 2013-06-01 LAB — COMPREHENSIVE METABOLIC PANEL (CC13)
ALT: 33 U/L (ref 0–55)
AST: 43 U/L — ABNORMAL HIGH (ref 5–34)
Albumin: 3.9 g/dL (ref 3.5–5.0)
Alkaline Phosphatase: 161 U/L — ABNORMAL HIGH (ref 40–150)
Anion Gap: 7 mEq/L (ref 3–11)
BUN: 22.3 mg/dL (ref 7.0–26.0)
CO2: 28 mEq/L (ref 22–29)
Calcium: 9.8 mg/dL (ref 8.4–10.4)
Chloride: 106 mEq/L (ref 98–109)
Creatinine: 1.7 mg/dL — ABNORMAL HIGH (ref 0.7–1.3)
Glucose: 130 mg/dl (ref 70–140)
Potassium: 4.8 mEq/L (ref 3.5–5.1)
Sodium: 141 mEq/L (ref 136–145)
Total Bilirubin: 0.58 mg/dL (ref 0.20–1.20)
Total Protein: 6.6 g/dL (ref 6.4–8.3)

## 2013-06-02 LAB — IGG, IGA, IGM
IgA: 111 mg/dL (ref 68–379)
IgG (Immunoglobin G), Serum: 889 mg/dL (ref 650–1600)
IgM, Serum: 86 mg/dL (ref 41–251)

## 2013-06-02 LAB — DIRECT ANTIGLOBULIN TEST (NOT AT ARMC)
DAT (Complement): NEGATIVE
DAT IgG: NEGATIVE

## 2013-06-07 ENCOUNTER — Telehealth: Payer: Self-pay | Admitting: *Deleted

## 2013-06-07 NOTE — Telephone Encounter (Signed)
Pt notified per Dr Humphrey Rolls, no changes in care, keep same appt for May

## 2013-07-15 ENCOUNTER — Other Ambulatory Visit: Payer: Self-pay | Admitting: Family Medicine

## 2013-07-16 DIAGNOSIS — H43399 Other vitreous opacities, unspecified eye: Secondary | ICD-10-CM | POA: Diagnosis not present

## 2013-07-21 DIAGNOSIS — L570 Actinic keratosis: Secondary | ICD-10-CM | POA: Diagnosis not present

## 2013-07-21 DIAGNOSIS — L821 Other seborrheic keratosis: Secondary | ICD-10-CM | POA: Diagnosis not present

## 2013-07-21 DIAGNOSIS — R21 Rash and other nonspecific skin eruption: Secondary | ICD-10-CM | POA: Diagnosis not present

## 2013-07-21 DIAGNOSIS — Z85828 Personal history of other malignant neoplasm of skin: Secondary | ICD-10-CM | POA: Diagnosis not present

## 2013-07-27 ENCOUNTER — Telehealth: Payer: Self-pay | Admitting: Oncology

## 2013-07-27 NOTE — Telephone Encounter (Signed)
kk out - pt to see NG 5/19 - date per NG. s/w he is aware.

## 2013-08-03 ENCOUNTER — Other Ambulatory Visit: Payer: Medicare Other

## 2013-08-03 ENCOUNTER — Ambulatory Visit: Payer: Medicare Other | Admitting: Oncology

## 2013-08-17 ENCOUNTER — Encounter: Payer: Self-pay | Admitting: Hematology and Oncology

## 2013-08-17 ENCOUNTER — Ambulatory Visit (HOSPITAL_BASED_OUTPATIENT_CLINIC_OR_DEPARTMENT_OTHER): Payer: Medicare Other | Admitting: Hematology and Oncology

## 2013-08-17 ENCOUNTER — Other Ambulatory Visit (HOSPITAL_BASED_OUTPATIENT_CLINIC_OR_DEPARTMENT_OTHER): Payer: Medicare Other

## 2013-08-17 VITALS — BP 128/57 | HR 60 | Temp 98.2°F | Resp 20 | Ht 69.0 in | Wt 163.1 lb

## 2013-08-17 DIAGNOSIS — D72829 Elevated white blood cell count, unspecified: Secondary | ICD-10-CM

## 2013-08-17 DIAGNOSIS — C9110 Chronic lymphocytic leukemia of B-cell type not having achieved remission: Secondary | ICD-10-CM | POA: Diagnosis not present

## 2013-08-17 DIAGNOSIS — R161 Splenomegaly, not elsewhere classified: Secondary | ICD-10-CM

## 2013-08-17 DIAGNOSIS — R799 Abnormal finding of blood chemistry, unspecified: Secondary | ICD-10-CM | POA: Diagnosis not present

## 2013-08-17 DIAGNOSIS — D696 Thrombocytopenia, unspecified: Secondary | ICD-10-CM | POA: Diagnosis not present

## 2013-08-17 DIAGNOSIS — D649 Anemia, unspecified: Secondary | ICD-10-CM | POA: Diagnosis not present

## 2013-08-17 DIAGNOSIS — R599 Enlarged lymph nodes, unspecified: Secondary | ICD-10-CM

## 2013-08-17 LAB — CBC WITH DIFFERENTIAL/PLATELET
BASO%: 0.1 % (ref 0.0–2.0)
Basophils Absolute: 0.1 10*3/uL (ref 0.0–0.1)
EOS%: 0.3 % (ref 0.0–7.0)
Eosinophils Absolute: 0.2 10*3/uL (ref 0.0–0.5)
HCT: 35 % — ABNORMAL LOW (ref 38.4–49.9)
HGB: 11.2 g/dL — ABNORMAL LOW (ref 13.0–17.1)
LYMPH%: 92 % — ABNORMAL HIGH (ref 14.0–49.0)
MCH: 33.4 pg (ref 27.2–33.4)
MCHC: 31.9 g/dL — ABNORMAL LOW (ref 32.0–36.0)
MCV: 104.5 fL — ABNORMAL HIGH (ref 79.3–98.0)
MONO#: 0.5 10*3/uL (ref 0.1–0.9)
MONO%: 0.9 % (ref 0.0–14.0)
NEUT#: 3.7 10*3/uL (ref 1.5–6.5)
NEUT%: 6.7 % — ABNORMAL LOW (ref 39.0–75.0)
Platelets: 76 10*3/uL — ABNORMAL LOW (ref 140–400)
RBC: 3.35 10*6/uL — ABNORMAL LOW (ref 4.20–5.82)
RDW: 14.8 % — ABNORMAL HIGH (ref 11.0–14.6)
WBC: 55.4 10*3/uL (ref 4.0–10.3)
lymph#: 51 10*3/uL — ABNORMAL HIGH (ref 0.9–3.3)

## 2013-08-17 LAB — TECHNOLOGIST REVIEW

## 2013-08-17 NOTE — Progress Notes (Signed)
[facility] FOLLOW-UP progress notes  Patient Care Team: Ria Bush, MD as PCP - General (Family Medicine) Minna Merritts, MD as Consulting Physician (Cardiology)  CHIEF COMPLAINTS/PURPOSE OF VISIT:  Stage IV CLL with progressive leukocytosis, anemia and thrombocytopenia  HISTORY OF PRESENTING ILLNESS:  Jimmy [name] 80 y.o. male was transferred to my care after his prior physician has left.  I reviewed the patient's records extensive and collaborated the history with the patient. Summary of his history is as follows: The patient was discovered to have progressive leukocytosis. Flow cytometry dated 04/15/2007 confirmed diagnosis of CLL, ZAP 70 negative. The patient was observed. Recently, he has progressive lymphadenopathy, splenomegaly and leukocytosis. On 04/20/2013, CT scan of the neck, chest abdomen and pelvis confirmed diffuse lymphadenopathy and splenomegaly.  The patient is asymptomatic. He felt that some of the lymph nodes in his neck has enlarged. He also complain of discomfort in the left upper quadrant due to splenomegaly. He denies recent recurrent infections. The patient denies any recent signs or symptoms of bleeding such as spontaneous epistaxis, hematuria or hematochezia. He denies any recent fever, chills, night sweats or abnormal weight loss  MEDICAL HISTORY:  Past Medical History  Diagnosis Date  . CAD (coronary artery disease)     S/P CAB x 4 1/03; Myoview 08 EF 63% normal perfusion  . GERD (gastroesophageal reflux disease) 1990s  . Hyperlipemia 2002  . Hypertension 2002  . CLL (chronic lymphoblastic leukemia)     Dr. Humphrey Rolls  . Thrombocytopenia     Dr. Chancy Milroy  . History of herpes genitalis     valtrex daily  . Carotid stenosis     R: 60-79%, stable (last [place] 09/2011)  . Diastolic CHF 6237    grade 1  . Aortic stenosis 2013    mild    SURGICAL HISTORY: Past Surgical History  Procedure Laterality Date  . Hepatic artery angioplasty   [address] [place]  . Hemorroidectomy  1954    Fissure repair, [place]  . Kidney stone surgery  1977    Dr Redmond Baseman  . Lithotripsy  1990s    Multiple  . Angioplasty  1998  . Coronary angioplasty  1998  . Coronary artery bypass graft  04/23/2001    x4, Dr. Pia Mau  . Esophagogastroduodenoscopy  11/29/1987    Gastritis  . Colonoscopy  11/29/1987    Normal  . Colonoscopy  02/07/2003    Hemm. Internal, focal proctitis, negative biopsy  . Colonoscopy  12/12/2004    Internal external hemorrhoids, +proctits, negative biopsy  . Cataract extraction, bilateral      R 1/09, L 8/09  . Ett  12/10/2006    Persantine myoview nml  . US echocardiography  07/2011    nl sys fxn, EF 55-60%, grade 1 diast dysfunction, mild AS, mildly elevated PA pressure    SOCIAL HISTORY: History   Social History  . Marital Status: Married    Spouse Name: N/A    Number of Children: N/A  . Years of Education: N/A   Occupational History  . Retired Furniture conservator/restorer    Social History Main Topics  . Smoking status: Former Smoker -- 2.00 packs/day for 30 years    Quit date: 02/28/1979  . Smokeless tobacco: Never Used     Comment: quit 1980  . Alcohol Use: 0.0 oz/week    0 drink(s) per week     Comment: occasional wine  . Drug Use: No  . Sexual Activity: Yes  Other Topics Concern  . Not on file   Social History Narrative   Married and lives with wife   1 son died of lung cancer at 34 years old   33 daughter bipolar, lives              FAMILY HISTORY: Family History  Problem Relation Age of Onset  . Hypertension Mother   . Heart disease Mother   . Hyperlipidemia Mother   . Hypertension Father   . Hyperlipidemia Father   . Kidney cancer Sister     Renal cell cancer  . Alcohol abuse Brother   . Diabetes Brother   . Stroke Brother   . Heart attack Brother     MI  . Diabetes Other     Sister's daughter  . Depression Daughter     Bipolar    ALLERGIES:  is allergic to new  skin.  MEDICATIONS:  Current Outpatient Prescriptions  Medication Sig Dispense Refill  . aspirin 81 MG EC tablet Take 162 mg by mouth daily.        [name] lisinopril (PRINIVIL,ZESTRIL) 40 MG tablet Take 1 tablet (40 mg total) by mouth daily.  90 tablet  1  . simvastatin (ZOCOR) 20 MG tablet TAKE 1 TABLET AT BEDTIME  90 tablet  3  . valACYclovir (VALTREX) 500 MG tablet TAKE 1 TABLET DAILY  90 tablet  0   No current facility-administered medications for this visit.    REVIEW OF SYSTEMS:   Constitutional: Denies fevers, chills or abnormal night sweats Eyes: Denies blurriness of vision, double vision or watery eyes Ears, nose, mouth, throat, and face: Denies mucositis or sore throat Respiratory: Denies cough, dyspnea or wheezes Cardiovascular: Denies palpitation, chest discomfort or lower extremity swelling Gastrointestinal:  Denies nausea, heartburn or change in bowel habits Skin: Denies abnormal skin rashes Neurological:Denies numbness, tingling or new weaknesses Behavioral/Psych: Mood is stable, no new changes  All other systems were reviewed with the patient and are negative.  PHYSICAL EXAMINATION: ECOG PERFORMANCE STATUS: 0 - Asymptomatic  Filed Vitals:   08/17/13 1225  BP: 128/57  Pulse: 60  Temp: 98.2 F (36.8 C)  Resp: 20   Filed Weights   08/17/13 1225  Weight: 163 lb 1.6 oz (73.982 kg)    GENERAL:alert, no distress and comfortable SKIN: skin color, texture, turgor are normal, no rashes or significant lesions EYES: normal, conjunctiva are pink and non-injected, sclera clear OROPHARYNX:no exudate, normal lips, buccal mucosa, and tongue  NECK: supple, thyroid normal size, non-tender, without nodularity LYMPH:  Palpable lymphadenopathy on both sides of his neck, axilla but I'm not able to appreciate lymphadenopathy in his groin.  LUNGS: clear to auscultation and percussion with normal breathing effort HEART: regular rate & rhythm and no murmurs without lower extremity  edema ABDOMEN:abdomen soft, non-tender and normal bowel sounds. There is diffuse splenomegaly in the left upper quadrant,  at least 4 fingerbreadths below the left costal margin. Musculoskeletal:no cyanosis of digits and no clubbing  PSYCH: alert & oriented x 3 with fluent speech NEURO: no focal motor/sensory deficits  LABORATORY DATA:  I have reviewed the data as listed Lab Results  Component Value Date   WBC 55.4* 08/17/2013   HGB 11.2* 08/17/2013   HCT 35.0* 08/17/2013   MCV 104.5* 08/17/2013   PLT 76* 08/17/2013    Recent Labs  08/21/12 0901  04/09/13 1321 05/04/13 1123 06/01/13 0950  NA 140  < > 141 139 141  K 4.6  < > 4.4 4.8  4.8  CL 106  --   --   --   --   CO2 28  < > 24 26 28   GLUCOSE 93  < > 110 102 130  BUN 20  < > 18.2 30.0* 22.3  CREATININE 1.6*  < > 1.5* 1.6* 1.7*  CALCIUM 9.2  < > 9.9 10.0 9.8  PROT  --   < > 7.4 6.8 6.6  ALBUMIN  --   < > 4.1 4.1 3.9  AST  --   < > 38* 39* 43*  ALT  --   < > 27 32 33  ALKPHOS  --   < > 162* 154* 161*  BILITOT  --   < > 0.71 0.55 0.58  < > = values in this interval not displayed.  RADIOGRAPHIC STUDIES: I have reviewed his CT scan from January 2015 I have personally reviewed the radiological images as listed and agreed with the findings in the report. No results found.  ASSESSMENT & PLAN:  #1 stage IV CLL #2 progressive lymphadenopathy #3 splenomegaly #4 progressive leukocytosis #5 progressive anemia #6 progressive thrombocytopenia He should have classic signs with of significant disease progression over the last few months. I am concerned about progression of his abnormal CBC. The patient is relatively asymptomatic. I recommend consideration to start him on treatment soon. The patient desired to spend the next few months without treatment and elected for observation at this point. I educated the patient's signs and symptoms to watch out for possible disease progression. I will see him back in 3 months with a repeat  blood work, history and physical examination. If his platelet count continued to drift closer to 50,000 with his next visti, we'll proceed to treat him immediately. Orders Placed This Encounter  Procedures  . CBC with Differential    Standing Status: Future     Number of Occurrences:      Standing Expiration Date: 08/17/2014  . Comprehensive metabolic panel    Standing Status: Future     Number of Occurrences:      Standing Expiration Date: 08/17/2014  . Lactate dehydrogenase    Standing Status: Future     Number of Occurrences:      Standing Expiration Date: 08/17/2014  . IgG, IgA, IgM    Standing Status: Future     Number of Occurrences:      Standing Expiration Date: 08/17/2014    All questions were answered. The patient knows to call the clinic with any problems, questions or concerns. I spent 25 minutes counseling the patient face to face. The total time spent in the appointment was 30 minutes and more than 50% was on counseling.     Heath Lark, MD 08/17/2013 8:12 PM

## 2013-08-19 ENCOUNTER — Telehealth: Payer: Self-pay | Admitting: Hematology and Oncology

## 2013-08-19 NOTE — Telephone Encounter (Signed)
s.w. pt and advised on Aug appt....pt ok and aware °

## 2013-08-20 ENCOUNTER — Other Ambulatory Visit: Payer: Self-pay | Admitting: Family Medicine

## 2013-08-20 DIAGNOSIS — E785 Hyperlipidemia, unspecified: Secondary | ICD-10-CM

## 2013-08-20 DIAGNOSIS — I10 Essential (primary) hypertension: Secondary | ICD-10-CM

## 2013-08-20 DIAGNOSIS — R7303 Prediabetes: Secondary | ICD-10-CM

## 2013-08-23 ENCOUNTER — Other Ambulatory Visit: Payer: Self-pay | Admitting: Family Medicine

## 2013-08-24 ENCOUNTER — Other Ambulatory Visit (INDEPENDENT_AMBULATORY_CARE_PROVIDER_SITE_OTHER): Payer: Medicare Other

## 2013-08-24 DIAGNOSIS — I10 Essential (primary) hypertension: Secondary | ICD-10-CM | POA: Diagnosis not present

## 2013-08-24 DIAGNOSIS — R7309 Other abnormal glucose: Secondary | ICD-10-CM

## 2013-08-24 DIAGNOSIS — R7303 Prediabetes: Secondary | ICD-10-CM

## 2013-08-24 DIAGNOSIS — E785 Hyperlipidemia, unspecified: Secondary | ICD-10-CM

## 2013-08-24 LAB — TSH: TSH: 2.18 u[IU]/mL (ref 0.35–4.50)

## 2013-08-24 LAB — COMPREHENSIVE METABOLIC PANEL
ALT: 28 U/L (ref 0–53)
AST: 41 U/L — ABNORMAL HIGH (ref 0–37)
Albumin: 4 g/dL (ref 3.5–5.2)
Alkaline Phosphatase: 109 U/L (ref 39–117)
BUN: 20 mg/dL (ref 6–23)
CO2: 28 mEq/L (ref 19–32)
Calcium: 9.6 mg/dL (ref 8.4–10.5)
Chloride: 106 mEq/L (ref 96–112)
Creatinine, Ser: 1.5 mg/dL (ref 0.4–1.5)
GFR: 47.57 mL/min — ABNORMAL LOW (ref >60.00)
Glucose, Bld: 101 mg/dL — ABNORMAL HIGH (ref 70–99)
Potassium: 4.1 mEq/L (ref 3.5–5.1)
Sodium: 140 mEq/L (ref 135–145)
Total Bilirubin: 0.9 mg/dL (ref 0.2–1.2)
Total Protein: 6.5 g/dL (ref 6.0–8.3)

## 2013-08-24 LAB — LIPID PANEL
Cholesterol: 80 mg/dL (ref 0–200)
HDL: 30.6 mg/dL — ABNORMAL LOW (ref >39.00)
LDL Cholesterol: 33 mg/dL (ref 0–99)
Total CHOL/HDL Ratio: 3
Triglycerides: 84 mg/dL (ref 0.0–149.0)
VLDL: 16.8 mg/dL (ref 0.0–40.0)

## 2013-08-24 LAB — HEMOGLOBIN A1C: Hgb A1c MFr Bld: 5.9 % (ref 4.6–6.5)

## 2013-08-25 DIAGNOSIS — D531 Other megaloblastic anemias, not elsewhere classified: Secondary | ICD-10-CM | POA: Diagnosis not present

## 2013-08-25 DIAGNOSIS — C9110 Chronic lymphocytic leukemia of B-cell type not having achieved remission: Secondary | ICD-10-CM | POA: Diagnosis not present

## 2013-08-25 DIAGNOSIS — D539 Nutritional anemia, unspecified: Secondary | ICD-10-CM | POA: Diagnosis not present

## 2013-08-30 ENCOUNTER — Telehealth: Payer: Self-pay | Admitting: Family Medicine

## 2013-08-30 ENCOUNTER — Ambulatory Visit (INDEPENDENT_AMBULATORY_CARE_PROVIDER_SITE_OTHER): Payer: Medicare Other | Admitting: Family Medicine

## 2013-08-30 ENCOUNTER — Encounter: Payer: Self-pay | Admitting: Family Medicine

## 2013-08-30 VITALS — BP 110/60 | HR 70 | Temp 98.3°F | Ht 69.0 in | Wt 161.0 lb

## 2013-08-30 DIAGNOSIS — Z23 Encounter for immunization: Secondary | ICD-10-CM

## 2013-08-30 DIAGNOSIS — C9110 Chronic lymphocytic leukemia of B-cell type not having achieved remission: Secondary | ICD-10-CM | POA: Diagnosis not present

## 2013-08-30 DIAGNOSIS — E785 Hyperlipidemia, unspecified: Secondary | ICD-10-CM

## 2013-08-30 DIAGNOSIS — R7309 Other abnormal glucose: Secondary | ICD-10-CM

## 2013-08-30 DIAGNOSIS — I6529 Occlusion and stenosis of unspecified carotid artery: Secondary | ICD-10-CM

## 2013-08-30 DIAGNOSIS — N183 Chronic kidney disease, stage 3 unspecified: Secondary | ICD-10-CM | POA: Diagnosis not present

## 2013-08-30 DIAGNOSIS — I10 Essential (primary) hypertension: Secondary | ICD-10-CM | POA: Diagnosis not present

## 2013-08-30 DIAGNOSIS — I350 Nonrheumatic aortic (valve) stenosis: Secondary | ICD-10-CM

## 2013-08-30 DIAGNOSIS — R7303 Prediabetes: Secondary | ICD-10-CM

## 2013-08-30 DIAGNOSIS — Z0000 Encounter for general adult medical examination without abnormal findings: Secondary | ICD-10-CM | POA: Diagnosis not present

## 2013-08-30 DIAGNOSIS — I359 Nonrheumatic aortic valve disorder, unspecified: Secondary | ICD-10-CM

## 2013-08-30 DIAGNOSIS — I2510 Atherosclerotic heart disease of native coronary artery without angina pectoris: Secondary | ICD-10-CM

## 2013-08-30 MED ORDER — LISINOPRIL 20 MG PO TABS: 20.0000 mg | ORAL_TABLET | Freq: Every day | ORAL | Status: DC

## 2013-08-30 NOTE — Progress Notes (Signed)
BP 110/60  Pulse 70  Temp(Src) 98.3 F (36.8 C) (Oral)  Ht 5\' 9"  (1.753 m)  Wt 161 lb (73.029 kg)  BMI 23.76 kg/m2  SpO2 95%   CC: medicare wellness visit  Subjective:    [name], male    DOB: Apr 01, 1929, 80 y.o.   MRN: [id number]  HPI: Jimmy [name] is a 80 y.o. male presenting on 08/30/2013 for Annual Exam   CLL - established with Dr. Elson Areas. Also went ot [facility] for 2nd opinion.  Compliant with meds - lisinopril was increased by cardiology to 40mg  daily (1 yr ago). Some dizziness attributed to increase in lisinopril.   Wt Readings from Last 3 Encounters:  08/30/13 161 lb (73.029 kg)  08/17/13 163 lb 1.6 oz (73.982 kg)  05/04/13 163 lb 8 oz (74.163 kg)   Body mass index is 23.76 kg/(m^2).  Vision checked this year at eye doctor. Hearing - wears hearing aides. New hearing aides in January. Denies falls, depression, anhedonia or sadness.   Preventative: Last colonoscopy 2006 - aged out. Prostate exam - aged out. Flu shot 12/2012 Pneumovax 1999. prevnar today. Td 2007  zostavax 2008.  Advanced directives: discussed - would like wife to be HCPOA. Will bring me copy.   Married and lives with wife  1 son died of lung cancer at 76 years old  1 daughter bipolar  Activity: walks 1 mi daily on treadmill  Diet: good water, some fruits/vegetables    Relevant past medical, surgical, family and social history reviewed and updated as indicated.  Allergies and medications reviewed and updated. Current Outpatient Prescriptions on File Prior to Visit  Medication Sig  . aspirin 81 MG EC tablet Take 162 mg by mouth daily.    . simvastatin (ZOCOR) 20 MG tablet TAKE 1 TABLET AT BEDTIME  . valACYclovir (VALTREX) 500 MG tablet TAKE 1 TABLET DAILY   No current facility-administered medications on file prior to visit.    Review of Systems Per HPI unless specifically indicated above    Objective:    BP 110/60  Pulse 70  Temp(Src) 98.3 F (36.8 C)  (Oral)  Ht 5\' 9"  (1.753 m)  Wt 161 lb (73.029 kg)  BMI 23.76 kg/m2  SpO2 95%  Physical Exam  Nursing note and vitals reviewed. Constitutional: He is oriented to person, place, and time. He appears well-developed and well-nourished. No distress.  HENT:  Head: Normocephalic and atraumatic.  Right Ear: Hearing, tympanic membrane, external ear and ear canal normal.  Left Ear: Hearing, tympanic membrane, external ear and ear canal normal.  Nose: Nose normal.  Mouth/Throat: Uvula is midline, oropharynx is clear and moist and mucous membranes are normal. No oropharyngeal exudate, posterior oropharyngeal edema or posterior oropharyngeal erythema.  Eyes: Conjunctivae and EOM are normal. Pupils are equal, round, and reactive to light. No scleral icterus.  Neck: Normal range of motion. Neck supple. Carotid bruit is present (bilateral). No thyromegaly present.  Cardiovascular: Normal rate, regular rhythm and intact distal pulses.   Murmur (4/6 SEM best at LUSB) heard. Pulses:      Radial pulses are 2+ on the right side, and 2+ on the left side.  Pulmonary/Chest: Effort normal and breath sounds normal. No respiratory distress. He has no wheezes. He has no rales.  Abdominal: Soft. Bowel sounds are normal. He exhibits no distension and no mass. There is splenomegaly. There is no tenderness. There is no rebound, no guarding and no CVA tenderness.  Musculoskeletal: Normal range of  motion. He exhibits no edema.  Lymphadenopathy:       Head (right side): No submental, no submandibular, no tonsillar, no preauricular and no posterior auricular adenopathy present.       Head (left side): No submental, no submandibular, no tonsillar, no preauricular and no posterior auricular adenopathy present.    He has cervical adenopathy (shotty L>R).    He has no axillary adenopathy.       Right axillary: No lateral adenopathy present.       Left axillary: No lateral adenopathy present. Neurological: He is alert and  oriented to person, place, and time.  CN grossly intact, station and gait intact Recall 2/3, 3/3 with cue Calculation 5/5 serial 7s  Skin: Skin is warm and dry. No rash noted.  Psychiatric: He has a normal mood and affect. His behavior is normal. Judgment and thought content normal.   Results for orders placed in visit on 08/24/13  LIPID PANEL      Result Value Ref Range   Cholesterol 80  0 - 200 mg/dL   Triglycerides 84.0  0.0 - 149.0 mg/dL   HDL 30.60 (*) >39.00 mg/dL   VLDL 16.8  0.0 - 40.0 mg/dL   LDL Cholesterol 33  0 - 99 mg/dL   Total CHOL/HDL Ratio 3    COMPREHENSIVE METABOLIC PANEL      Result Value Ref Range   Sodium 140  135 - 145 mEq/L   Potassium 4.1  3.5 - 5.1 mEq/L   Chloride 106  96 - 112 mEq/L   CO2 28  19 - 32 mEq/L   Glucose, Bld 101 (*) 70 - 99 mg/dL   BUN 20  6 - 23 mg/dL   Creatinine, Ser 1.5  0.4 - 1.5 mg/dL   Total Bilirubin 0.9  0.2 - 1.2 mg/dL   Alkaline Phosphatase 109  39 - 117 U/L   AST 41 (*) 0 - 37 U/L   ALT 28  0 - 53 U/L   Total Protein 6.5  6.0 - 8.3 g/dL   Albumin 4.0  3.5 - 5.2 g/dL   Calcium 9.6  8.4 - 10.5 mg/dL   GFR 47.57 (*) >60.00 mL/min  TSH      Result Value Ref Range   TSH 2.18  0.35 - 4.50 uIU/mL  HEMOGLOBIN A1C      Result Value Ref Range   Hemoglobin A1C 5.9  4.6 - 6.5 %      Assessment & Plan:   Problem List Items Addressed This Visit   Prediabetes     Mild. Continue to monitor.    Medicare annual wellness visit, subsequent     I have personally reviewed the Medicare Annual Wellness questionnaire and have noted 1. The patient's medical and social history 2. Their use of alcohol, tobacco or illicit drugs 3. Their current medications and supplements 4. The patient's functional ability including ADL's, fall risks, home safety risks and hearing or visual impairment. 5. Diet and physical activity 6. Evidence for depression or mood disorders The patients weight, height, BMI have been recorded in the chart.  Hearing and  vision has been addressed. I have made referrals, counseling and provided education to the patient based review of the above and I have provided the pt with a written personalized care plan for preventive services. See scanned questionairre. Advanced directives discussed: wife would be HCPOA. Pt will bring me copy.  Reviewed preventative protocols and updated unless pt declined.    HYPERTENSION  Chronic, stable. Continue lisinopril, but will lower dose to 20mg  daily given endorsed dizzy episodes recently.    Relevant Medications      lisinopril (PRINIVIL,ZESTRIL) tablet   HYPERLIPIDEMIA     Chronic, stable. Good control on simvastatin 20mg  daily - continue.    Relevant Medications      lisinopril (PRINIVIL,ZESTRIL) tablet   CORONARY ARTERY DISEASE     asxs    Relevant Medications      lisinopril (PRINIVIL,ZESTRIL) tablet   Chronic lymphocytic leukemia, Rai stage I (Chronic)     Stage 4. Has established with local oncologist and seen [facility] for 2nd opinion. Awaiting their blood tests.    Chronic kidney disease, stage 3 - Primary     Reviewed dx with patient. Encouraged good hydration with water and avoiding NSAIDs.    CAROTID STENOSIS     F/u carotid US due 11/2013.    Relevant Medications      lisinopril (PRINIVIL,ZESTRIL) tablet   Aortic stenosis     Harsher today. rec f/u with cards. Denies dyspnea or chest discomfort. Did not re order echo.    Relevant Medications      lisinopril (PRINIVIL,ZESTRIL) tablet       Follow up plan: Return as needed, for wellness exam.

## 2013-08-30 NOTE — Assessment & Plan Note (Signed)
asxs. 

## 2013-08-30 NOTE — Assessment & Plan Note (Signed)
Stage 4. Has established with local oncologist and seen [facility] for 2nd opinion. Awaiting their blood tests.

## 2013-08-30 NOTE — Assessment & Plan Note (Signed)
Chronic, stable. Good control on simvastatin 20mg  daily - continue.

## 2013-08-30 NOTE — Assessment & Plan Note (Signed)
Mild. Continue to monitor. 

## 2013-08-30 NOTE — Assessment & Plan Note (Signed)
F/u carotid US due 11/2013.

## 2013-08-30 NOTE — Assessment & Plan Note (Signed)
Harsher today. rec f/u with cards. Denies dyspnea or chest discomfort. Did not re order echo.

## 2013-08-30 NOTE — Telephone Encounter (Signed)
Relevant patient education mailed to patient.  

## 2013-08-30 NOTE — Progress Notes (Signed)
Pre-visit discussion using our clinic review tool. No additional management support is needed unless otherwise documented below in the visit note.  

## 2013-08-30 NOTE — Assessment & Plan Note (Signed)
Reviewed dx with patient. Encouraged good hydration with water and avoiding NSAIDs.

## 2013-08-30 NOTE — Patient Instructions (Addendum)
Let's cut lisinopril in 1/2 (20mg  daily - new dose sent to mail order pharmacy). prevnar today. Bring me a copy of advanced directive to update chart. We will wait and see what cancer doctors recommend. Return as needed or in 1 year for next wellness exam.

## 2013-08-30 NOTE — Assessment & Plan Note (Signed)
Chronic, stable. Continue lisinopril, but will lower dose to 20mg  daily given endorsed dizzy episodes recently.

## 2013-08-30 NOTE — Assessment & Plan Note (Signed)
I have personally reviewed the Medicare Annual Wellness questionnaire and have noted 1. The patient's medical and social history 2. Their use of alcohol, tobacco or illicit drugs 3. Their current medications and supplements 4. The patient's functional ability including ADL's, fall risks, home safety risks and hearing or visual impairment. 5. Diet and physical activity 6. Evidence for depression or mood disorders The patients weight, height, BMI have been recorded in the chart.  Hearing and vision has been addressed. I have made referrals, counseling and provided education to the patient based review of the above and I have provided the pt with a written personalized care plan for preventive services. See scanned questionairre. Advanced directives discussed: wife would be HCPOA. Pt will bring me copy.  Reviewed preventative protocols and updated unless pt declined.

## 2013-08-30 NOTE — Addendum Note (Signed)
Addended by: Chilton Greathouse on: 08/30/2013 11:42 AM   Modules accepted: Orders

## 2013-09-22 DIAGNOSIS — C9110 Chronic lymphocytic leukemia of B-cell type not having achieved remission: Secondary | ICD-10-CM | POA: Diagnosis not present

## 2013-09-22 DIAGNOSIS — Z006 Encounter for examination for normal comparison and control in clinical research program: Secondary | ICD-10-CM | POA: Diagnosis not present

## 2013-09-28 ENCOUNTER — Encounter: Payer: Self-pay | Admitting: Adult Health

## 2013-09-28 ENCOUNTER — Ambulatory Visit (INDEPENDENT_AMBULATORY_CARE_PROVIDER_SITE_OTHER): Payer: Medicare Other | Admitting: Adult Health

## 2013-09-28 VITALS — BP 136/64 | HR 66 | Temp 98.1°F | Resp 16 | Ht 69.0 in | Wt 161.2 lb

## 2013-09-28 DIAGNOSIS — R42 Dizziness and giddiness: Secondary | ICD-10-CM

## 2013-09-28 DIAGNOSIS — I2510 Atherosclerotic heart disease of native coronary artery without angina pectoris: Secondary | ICD-10-CM

## 2013-09-28 LAB — BASIC METABOLIC PANEL
BUN: 19 mg/dL (ref 6–23)
CO2: 28 mEq/L (ref 19–32)
Calcium: 9.7 mg/dL (ref 8.4–10.5)
Chloride: 104 mEq/L (ref 96–112)
Creatinine, Ser: 1.3 mg/dL (ref 0.4–1.5)
GFR: 57.11 mL/min — ABNORMAL LOW (ref >60.00)
Glucose, Bld: 91 mg/dL (ref 70–99)
Potassium: 4.4 mEq/L (ref 3.5–5.1)
Sodium: 139 mEq/L (ref 135–145)

## 2013-09-28 NOTE — Progress Notes (Signed)
Pre visit review using our clinic review tool, if applicable. No additional management support is needed unless otherwise documented below in the visit note. 

## 2013-09-28 NOTE — Progress Notes (Signed)
Subjective:    [name], male    DOB: Sep 07, 1928, 80 y.o.   MRN: [id number]  Dizziness Pertinent negatives include no chest pain or weakness.    80 yo male with history of CLL, carotid stenosis, AS with EF 55% (Echo 2013), CAD and CKD, presents today for dizziness. Indicates it started yesterday and has since improved to the point that he now only lightly feels it. When walking yesterday he felt as though he would have to hold on to things. Describes nausea but no vomitting. Has not tried any treatments. Adds that he did have a bout of constipation for which he took 2 senna, and since had a bowel movement. Had decreased nutritional intake on Sunday which has improved since. He reports that his blood pressure was elevated yesterday with systolic in the 976B and diastolic in the high 34L.   Able to walk on the treadmill this morning and walked with minimal issues. He denies shortness of breath or chest pain.   Past Medical History  Diagnosis Date  . CAD (coronary artery disease)     S/P CAB x 4 1/03; Myoview 08 EF 63% normal perfusion  . GERD (gastroesophageal reflux disease) 1990s  . Hyperlipemia 2002  . Hypertension 2002  . CLL (chronic lymphoblastic leukemia)     Dr. Ivor Messier  . Thrombocytopenia     Dr. Ivor Messier  . History of herpes genitalis     valtrex daily  . Carotid stenosis     R: 60-79%, stable (last [place] 09/2011)  . Diastolic CHF 9379    grade 1  . Aortic stenosis 2013    mild  . Chronic kidney disease, stage 3     Current Outpatient Prescriptions on File Prior to Visit  Medication Sig Dispense Refill  . aspirin 81 MG EC tablet Take 162 mg by mouth daily.        [name] lisinopril (PRINIVIL,ZESTRIL) 20 MG tablet Take 1 tablet (20 mg total) by mouth daily.  90 tablet  3  . simvastatin (ZOCOR) 20 MG tablet TAKE 1 TABLET AT BEDTIME  90 tablet  3  . valACYclovir (VALTREX) 500 MG tablet TAKE 1 TABLET DAILY  90 tablet  0   No current facility-administered  medications on file prior to visit.     Review of Systems  Constitutional: Negative.   HENT: Negative.   Eyes: Negative.   Respiratory: Negative.  Negative for chest tightness and shortness of breath.   Cardiovascular: Negative.  Negative for chest pain and palpitations.  Gastrointestinal: Positive for constipation (relieved with senna).  Genitourinary: Negative.   Musculoskeletal: Negative.   Allergic/Immunologic: Negative.   Neurological: Positive for dizziness and light-headedness. Negative for weakness.  Hematological: Negative.   Psychiatric/Behavioral: Negative.   All other systems reviewed and are negative.      Objective:  BP 120/62  Pulse 59  Temp(Src) 98.1 F (36.7 C) (Oral)  Resp 16  Wt 161 lb 4 oz (73.143 kg)  SpO2 95%   Physical Exam  Constitutional: He is oriented to person, place, and time. No distress.  HENT:  Right Ear: External ear normal.  Left Ear: External ear normal.  Nose: Nose normal.  Mouth/Throat: Oropharynx is clear and moist.  Eyes: Conjunctivae and EOM are normal. Pupils are equal, round, and reactive to light.  Neck: Normal range of motion. Neck supple.  Cardiovascular: Normal rate and regular rhythm.   Murmur heard. Systolic murmur  Pulmonary/Chest: Effort normal and breath sounds  normal. No respiratory distress.  Abdominal: Soft. Bowel sounds are normal.  Musculoskeletal: Normal range of motion. He exhibits no edema and no tenderness.  Neurological: He is alert and oriented to person, place, and time.  Skin: Skin is warm and dry.  Psychiatric: He has a normal mood and affect. His behavior is normal. Judgment and thought content normal.       Assessment & Plan:   1. Lightheaded Symptoms appear to be resolving. No orthostatic hypotension noted. Aortic stenotic murmur noted. Will obtain BMET to r/o electrolyte disturbance.  Last seen by cardiology over 1 year ago. Refer to Dr. Rockey Situ for potential 2D echo and carotid ultrasound.  Follow up with PCP if symptoms return or worsen.   - Basic metabolic panel

## 2013-10-06 DIAGNOSIS — Z8619 Personal history of other infectious and parasitic diseases: Secondary | ICD-10-CM | POA: Diagnosis not present

## 2013-10-06 DIAGNOSIS — C9110 Chronic lymphocytic leukemia of B-cell type not having achieved remission: Secondary | ICD-10-CM | POA: Diagnosis not present

## 2013-10-07 ENCOUNTER — Encounter: Payer: Self-pay | Admitting: Nurse Practitioner

## 2013-10-07 ENCOUNTER — Ambulatory Visit (INDEPENDENT_AMBULATORY_CARE_PROVIDER_SITE_OTHER): Payer: Medicare Other | Admitting: Nurse Practitioner

## 2013-10-07 ENCOUNTER — Encounter (INDEPENDENT_AMBULATORY_CARE_PROVIDER_SITE_OTHER): Payer: Self-pay

## 2013-10-07 VITALS — BP 100/52 | HR 65 | Ht 69.5 in | Wt 161.2 lb

## 2013-10-07 DIAGNOSIS — R2681 Unsteadiness on feet: Secondary | ICD-10-CM

## 2013-10-07 DIAGNOSIS — I2510 Atherosclerotic heart disease of native coronary artery without angina pectoris: Secondary | ICD-10-CM | POA: Diagnosis not present

## 2013-10-07 DIAGNOSIS — R269 Unspecified abnormalities of gait and mobility: Secondary | ICD-10-CM

## 2013-10-07 DIAGNOSIS — I350 Nonrheumatic aortic (valve) stenosis: Secondary | ICD-10-CM

## 2013-10-07 DIAGNOSIS — I359 Nonrheumatic aortic valve disorder, unspecified: Secondary | ICD-10-CM | POA: Diagnosis not present

## 2013-10-07 NOTE — Progress Notes (Signed)
Patient Name: Jimmy [name] Date of Encounter: 10/07/2013  Primary Care Provider:  Ria Bush, MD Primary Cardiologist:  Dr. Rockey Situ, MD  Patient Profile  80 year old male with history of bilateral carotid stenosis and aortic stenosis presents for evaluation of unsteady gait and weakness.   Problem [name]   Past Medical History  Diagnosis Date  . CAD (coronary artery disease)     a. 04/2001 S/P CABGx 4;  b. 2008 MV:  EF 63% normal perfusion.  [name] GERD (gastroesophageal reflux disease) 1990s  . Hyperlipemia 2002  . Hypertension 2002  . CLL (chronic lymphoblastic leukemia)     Dr. Ivor Messier  . Thrombocytopenia     Dr. Ivor Messier  . History of herpes genitalis     valtrex daily  . Carotid stenosis     a. 11/2012 Carotid U/S:  RICA 16-10%, LICA 96-04%.  . Diastolic CHF     a. 07/4096 Echo: EF 55-65%, mild conc LVH, Gr 1 DD, mild AS, triv AI, mildly dil Ao root (3.5 cm).  . Mild aortic stenosis     a. 07/2011 Echo: Mild AS, triv AI.  [name] Chronic kidney disease, stage 3    Past Surgical History  Procedure Laterality Date  . Hepatic artery angioplasty  [address], [place]  . Hemorroidectomy  1954    Fissure repair, [place]  . Kidney stone surgery  1977    Dr Redmond Baseman  . Lithotripsy  1990s    Multiple  . Angioplasty  1998  . Coronary angioplasty  1998  . Coronary artery bypass graft  04/23/2001    x4, Dr. Pia Mau  . Esophagogastroduodenoscopy  11/29/1987    Gastritis  . Colonoscopy  11/29/1987    Normal  . Colonoscopy  02/07/2003    Hemm. Internal, focal proctitis, negative biopsy  . Colonoscopy  12/12/2004    Internal external hemorrhoids, +proctits, negative biopsy  . Cataract extraction, bilateral      R 1/09, L 8/09  . Ett  12/10/2006    Persantine myoview nml  . US echocardiography  07/2011    nl sys fxn, EF 55-60%, grade 1 diast dysfunction, mild AS, mildly elevated PA pressure    Allergies  Allergies  Allergen Reactions  . New Skin     HPI  80  year old with history above presents for evaluation of unsteady gait and weakness. Patient with history of TIA, bilateral carotid stenosis, 11/2012 doppler revealed 60-79% stenosis in right and 40-59% stenosis in the left this was stable from study done in March 2013, mild aortic stenosis, trivial aortic regurgitation, and most likely trileaflet aortic valve on study done in 2013.   He states back in December 2013 his lisinopril was increased from 20 mg to 40 and he has felt weak since. He presented to outside clinic on 08/3013 with a two day history of unsteady gait and dizziness that was improving at that time. This was not associated with any chest pain, pressure, nausea, vomiting, diaphoresis, SOB, DOE, or palpitations. He denied any unilateral extremity weakness, numbness, or slurred speech. His orthostatics were reported as normal. His BMP was normal. He was advised to follow up with [place] for further evaluation. He exercises regularly without issues.   Home Medications  Prior to Admission medications   Medication Sig Start Date End Date Taking? Authorizing Provider  aspirin 81 MG EC tablet Take 162 mg by mouth daily.     Yes Historical Provider, MD  lisinopril (  PRINIVIL,ZESTRIL) 20 MG tablet Take 1 tablet (20 mg total) by mouth daily. 08/30/13  Yes Ria Bush, MD  simvastatin (ZOCOR) 20 MG tablet TAKE 1 TABLET AT BEDTIME 08/23/13  Yes Ria Bush, MD  valACYclovir (VALTREX) 500 MG tablet TAKE 1 TABLET DAILY 07/15/13  Yes Ria Bush, MD    Family History  Family History  Problem Relation Age of Onset  . Hypertension Mother   . Heart disease Mother   . Hyperlipidemia Mother   . Hypertension Father   . Hyperlipidemia Father   . Kidney cancer Sister     Renal cell cancer  . Alcohol abuse Brother   . Diabetes Brother   . Stroke Brother   . Heart attack Brother     MI  . Diabetes Other     Sister's daughter  . Depression Daughter     Bipolar    Social History  History     Social History  . Marital Status: Married    Spouse Name: N/A    Number of Children: N/A  . Years of Education: N/A   Occupational History  . Retired Furniture conservator/restorer    Social History Main Topics  . Smoking status: Former Smoker -- 2.00 packs/day for 30 years    Quit date: 02/28/1979  . Smokeless tobacco: Never Used     Comment: quit 1980  . Alcohol Use: 0.0 oz/week    0 drink(s) per week     Comment: occasional wine  . Drug Use: No  . Sexual Activity: Yes   Other Topics Concern  . Not on file   Social History Narrative   Married and lives with wife   1 son died of lung cancer at 44 years old   75 daughter bipolar, lives               Review of Systems General:  No chills, fever, night sweats or weight changes.  Cardiovascular:  No chest pain, dyspnea on exertion, edema, orthopnea, palpitations, paroxysmal nocturnal dyspnea. Dermatological: No rash, lesions/masses Respiratory: No cough, dyspnea Urologic: No hematuria, dysuria Abdominal:   No nausea, vomiting, diarrhea, bright red blood per rectum, melena, or hematemesis Neurologic:  No visual changes, wkns, changes in mental status. All other systems reviewed and are otherwise negative except as noted above.  Physical Exam  Blood pressure 100/52, pulse 65, height 5' 9.5" (1.765 m), weight 161 lb 4 oz (73.143 kg).  General: Pleasant, NAD Psych: Normal affect. Neuro: Alert and oriented X 3. Moves all extremities spontaneously. HEENT: Normal  Neck: Supple with bilateral soft carotid bruits. No JVD. Lungs:  Resp regular and unlabored, CTA. Heart: RRR no s3, s4, 2/6 harsh systolic murmur.  Abdomen: Soft, non-tender, non-distended, BS + x 4.  Extremities: No clubbing, cyanosis or edema. DP/PT/Radials 2+ and equal bilaterally.  Accessory Clinical Findings  ECG - NSR, 65, not st/t changes  Orthostatics:  Lying: 64, 149/69 Sitting: 70, 114/59 Standing: 73, 105/58 Standing 3 min: 73, 121/66  Assessment &  Plan  1. History of unsteady gait/carotid stenosis: Patient initially presented to outside clinic with 2 day history of unsteady gait on 09/28/13, which was resolving @ the time and has subsequently resolved completely.  Total duration of Ss was ~ 2 days.  He has had no recurrence since.  He does have a h/o TIA's, though in the past those apparently impacted his vision.  With unsteady gait, would be concerned for possibility of cerebellar TIA.  No focal deficits on exam today.  He  remains on ASA therapy.  He has known CVD and we will arrange for repeat carotid u/s (last 11/2013 with R>L ICA stenosis).  I have advised that if he has any recurrent Ss, he should have a low threshold to present to the ED for further eval/CT/MRI.  He verbalized understanding.  2. Hypotension: Patient states his blood pressure runs in the 1-teens/60's usually. In December 2013 his lisinopril was increased to 40 mg daily from 20 mg daily. Since this increase he was felt weak and fatigued. He has just dealt with this. His PCP decreased his lisinopril to 20 mg daily on 08/30/13 and he has felt better. Today his BP is 100/52 and he is also orthostatic, though he feels ok. Will further decrease to lisinopril 10 mg daily.   3. Mild Aortic stenosis:  Last echo 07/2011->mild AS/AI.  Doubt that this is contributing to his unsteady gain but as it's been 2 years will repeat echo.  4. Chronic diastolic CHF: Euvolemic on exam.  Blood pressure/HR well controlled.  5.  CAD:  No recent c/p or dyspnea.  Cont current meds.  6. Dispo: F/U 1 year or sooner if necessary.   Murray Hodgkins, NP 10/07/2013, 2:13 PM

## 2013-10-07 NOTE — Patient Instructions (Signed)
Your physician has requested that you have a carotid duplex. This test is an ultrasound of the carotid arteries in your neck. It looks at blood flow through these arteries that supply the brain with blood. Allow one hour for this exam. There are no restrictions or special instructions.  Your physician has requested that you have an echocardiogram. Echocardiography is a painless test that uses sound waves to create images of your heart. It provides your doctor with information about the size and shape of your heart and how well your heart's chambers and valves are working. This procedure takes approximately one hour. There are no restrictions for this procedure.  Your physician recommends that you schedule a follow-up appointment in: 1 year with Dr. Rockey Situ

## 2013-10-12 DIAGNOSIS — W57XXXA Bitten or stung by nonvenomous insect and other nonvenomous arthropods, initial encounter: Secondary | ICD-10-CM | POA: Diagnosis not present

## 2013-10-12 DIAGNOSIS — R209 Unspecified disturbances of skin sensation: Secondary | ICD-10-CM | POA: Diagnosis not present

## 2013-10-12 DIAGNOSIS — L299 Pruritus, unspecified: Secondary | ICD-10-CM | POA: Diagnosis not present

## 2013-10-12 DIAGNOSIS — T148 Other injury of unspecified body region: Secondary | ICD-10-CM | POA: Diagnosis not present

## 2013-10-12 DIAGNOSIS — L219 Seborrheic dermatitis, unspecified: Secondary | ICD-10-CM | POA: Diagnosis not present

## 2013-10-13 DIAGNOSIS — R894 Abnormal immunological findings in specimens from other organs, systems and tissues: Secondary | ICD-10-CM | POA: Diagnosis not present

## 2013-10-20 DIAGNOSIS — Z006 Encounter for examination for normal comparison and control in clinical research program: Secondary | ICD-10-CM | POA: Diagnosis not present

## 2013-10-20 DIAGNOSIS — C9110 Chronic lymphocytic leukemia of B-cell type not having achieved remission: Secondary | ICD-10-CM | POA: Diagnosis not present

## 2013-10-20 DIAGNOSIS — Z5111 Encounter for antineoplastic chemotherapy: Secondary | ICD-10-CM | POA: Diagnosis not present

## 2013-10-21 ENCOUNTER — Encounter: Payer: Self-pay | Admitting: Family Medicine

## 2013-10-21 DIAGNOSIS — C9110 Chronic lymphocytic leukemia of B-cell type not having achieved remission: Secondary | ICD-10-CM | POA: Diagnosis not present

## 2013-10-21 DIAGNOSIS — Z5111 Encounter for antineoplastic chemotherapy: Secondary | ICD-10-CM | POA: Diagnosis not present

## 2013-10-21 DIAGNOSIS — Z006 Encounter for examination for normal comparison and control in clinical research program: Secondary | ICD-10-CM | POA: Diagnosis not present

## 2013-10-22 DIAGNOSIS — Z006 Encounter for examination for normal comparison and control in clinical research program: Secondary | ICD-10-CM | POA: Diagnosis not present

## 2013-10-22 DIAGNOSIS — Z5111 Encounter for antineoplastic chemotherapy: Secondary | ICD-10-CM | POA: Diagnosis not present

## 2013-10-22 DIAGNOSIS — C9110 Chronic lymphocytic leukemia of B-cell type not having achieved remission: Secondary | ICD-10-CM | POA: Diagnosis not present

## 2013-10-26 DIAGNOSIS — T148 Other injury of unspecified body region: Secondary | ICD-10-CM | POA: Diagnosis not present

## 2013-10-26 DIAGNOSIS — L219 Seborrheic dermatitis, unspecified: Secondary | ICD-10-CM | POA: Diagnosis not present

## 2013-10-28 ENCOUNTER — Encounter (INDEPENDENT_AMBULATORY_CARE_PROVIDER_SITE_OTHER): Payer: Medicare Other

## 2013-10-28 ENCOUNTER — Other Ambulatory Visit: Payer: Self-pay

## 2013-10-28 ENCOUNTER — Other Ambulatory Visit (INDEPENDENT_AMBULATORY_CARE_PROVIDER_SITE_OTHER): Payer: Medicare Other

## 2013-10-28 DIAGNOSIS — I2510 Atherosclerotic heart disease of native coronary artery without angina pectoris: Secondary | ICD-10-CM

## 2013-10-28 DIAGNOSIS — R2681 Unsteadiness on feet: Secondary | ICD-10-CM

## 2013-10-28 DIAGNOSIS — I6529 Occlusion and stenosis of unspecified carotid artery: Secondary | ICD-10-CM | POA: Diagnosis not present

## 2013-10-28 DIAGNOSIS — I359 Nonrheumatic aortic valve disorder, unspecified: Secondary | ICD-10-CM | POA: Diagnosis not present

## 2013-10-28 DIAGNOSIS — I350 Nonrheumatic aortic (valve) stenosis: Secondary | ICD-10-CM

## 2013-10-28 DIAGNOSIS — C9110 Chronic lymphocytic leukemia of B-cell type not having achieved remission: Secondary | ICD-10-CM | POA: Diagnosis not present

## 2013-11-13 ENCOUNTER — Encounter: Payer: Self-pay | Admitting: Family Medicine

## 2013-11-13 ENCOUNTER — Other Ambulatory Visit: Payer: Self-pay | Admitting: Family Medicine

## 2013-11-17 DIAGNOSIS — Z006 Encounter for examination for normal comparison and control in clinical research program: Secondary | ICD-10-CM | POA: Diagnosis not present

## 2013-11-17 DIAGNOSIS — C9110 Chronic lymphocytic leukemia of B-cell type not having achieved remission: Secondary | ICD-10-CM | POA: Diagnosis not present

## 2013-11-18 ENCOUNTER — Telehealth: Payer: Self-pay | Admitting: Family Medicine

## 2013-11-18 ENCOUNTER — Encounter: Payer: Self-pay | Admitting: Family Medicine

## 2013-11-18 ENCOUNTER — Ambulatory Visit (INDEPENDENT_AMBULATORY_CARE_PROVIDER_SITE_OTHER): Payer: Medicare Other | Admitting: Family Medicine

## 2013-11-18 VITALS — BP 132/60 | HR 73 | Temp 97.9°F | Wt 166.5 lb

## 2013-11-18 DIAGNOSIS — I2510 Atherosclerotic heart disease of native coronary artery without angina pectoris: Secondary | ICD-10-CM | POA: Diagnosis not present

## 2013-11-18 DIAGNOSIS — Z006 Encounter for examination for normal comparison and control in clinical research program: Secondary | ICD-10-CM | POA: Diagnosis not present

## 2013-11-18 DIAGNOSIS — C9110 Chronic lymphocytic leukemia of B-cell type not having achieved remission: Secondary | ICD-10-CM | POA: Diagnosis not present

## 2013-11-18 DIAGNOSIS — R0789 Other chest pain: Secondary | ICD-10-CM | POA: Diagnosis not present

## 2013-11-18 DIAGNOSIS — Z5111 Encounter for antineoplastic chemotherapy: Secondary | ICD-10-CM | POA: Diagnosis not present

## 2013-11-18 MED ORDER — NITROGLYCERIN 0.4 MG SL SUBL: 0.4000 mg | SUBLINGUAL_TABLET | SUBLINGUAL | Status: DC | PRN

## 2013-11-18 NOTE — Telephone Encounter (Signed)
Will see today. Thanks

## 2013-11-18 NOTE — Patient Instructions (Signed)
Limit exertion, take the nitroglycerin as needed.  If you have severe pain that doesn't resolve, then go to the ER.  I'll check with Dr. Darnell Level and cardiology in the meantime.  Take care.

## 2013-11-18 NOTE — Progress Notes (Signed)
Pre visit review using our clinic review tool, if applicable. No additional management support is needed unless otherwise documented below in the visit note.  He goes to the gym almost every day.  Usually walking on treadmill first.  1 mile walking at 20/min per mile.  This AM after about 1/4 mile had pressure in his chest.  Stopped walking and the pressure would go away after about 1 minute.  Able to walk again and then it returned, again resolved with rest.  Has happened prev in distant past, but not recently before today.  He prev attributed it to higher BP during the events in the past.  H/o CAD with CABG prev w/o MI.  No pain, no discomfort now.  Not SOB acutely, but "not what it used to be."  No BLE.    On chemo for CLL currently.  Bendamustine and rituxan yesterday, Bendamustine only today.    PMH and SH reviewed  ROS: See HPI, otherwise noncontributory.  Meds, vitals, and allergies reviewed.   GEN: nad, alert and oriented HEENT: mucous membranes moist NECK: supple w/o LA CV: rrr. Midline scar noted PULM: ctab, no inc wob ABD: soft, +bs EXT: no edema SKIN: no acute rash

## 2013-11-18 NOTE — Telephone Encounter (Signed)
Patient Information:  Caller Name: [place]  Phone: [phone]  Patient: Jimmy [name], Jimmy [name]  Gender: Male  DOB: 1929-02-27  Age: 80 Years  PCP: Ria Bush [facility])  Office Follow Up:  Does the office need to follow up with this patient?: No  Instructions For The Office: N/A  RN Note:  Chest Pressure, onset 8-20.  Pt was walking on treadmill at gym on 8-20 when Chest pressure developed.  Pt is currently asymptomatic.  Pt is at [facility] awaiting Chemo, Oncologist has cleared Pt for Chemo.  Pt requesting appt only d/t evaluated by Oncologlist. Appt scheduled at 1500 on 8-20 w/ Dr Damita Dunnings d/t time needed for Chemo.  Pt/Wife verbalized understanding.  Symptoms  Reason For Call & Symptoms: Chest Pressure, onset 8-20  Reviewed Health History In EMR: N/A  Reviewed Medications In EMR: N/A  Reviewed Allergies In EMR: N/A  Reviewed Surgeries / Procedures: N/A  Date of Onset of Symptoms: 11/18/2013  Guideline(s) Used:  No Protocol Available - Information Only  Disposition Per Guideline:   Home Care  Reason For Disposition Reached:   Information only question and nurse able to answer  Advice Given:  N/A  Patient Will Follow Care Advice:  YES

## 2013-11-19 ENCOUNTER — Ambulatory Visit (INDEPENDENT_AMBULATORY_CARE_PROVIDER_SITE_OTHER): Payer: Medicare Other | Admitting: Cardiovascular Disease

## 2013-11-19 ENCOUNTER — Ambulatory Visit: Payer: Medicare Other | Admitting: Hematology and Oncology

## 2013-11-19 ENCOUNTER — Other Ambulatory Visit: Payer: Medicare Other

## 2013-11-19 ENCOUNTER — Encounter: Payer: Self-pay | Admitting: Family Medicine

## 2013-11-19 ENCOUNTER — Encounter: Payer: Self-pay | Admitting: Cardiovascular Disease

## 2013-11-19 VITALS — BP 112/60 | HR 64 | Ht 69.0 in | Wt 165.2 lb

## 2013-11-19 DIAGNOSIS — I10 Essential (primary) hypertension: Secondary | ICD-10-CM | POA: Diagnosis not present

## 2013-11-19 DIAGNOSIS — Z951 Presence of aortocoronary bypass graft: Secondary | ICD-10-CM | POA: Diagnosis not present

## 2013-11-19 DIAGNOSIS — R0789 Other chest pain: Secondary | ICD-10-CM

## 2013-11-19 DIAGNOSIS — I359 Nonrheumatic aortic valve disorder, unspecified: Secondary | ICD-10-CM | POA: Diagnosis not present

## 2013-11-19 DIAGNOSIS — I2510 Atherosclerotic heart disease of native coronary artery without angina pectoris: Secondary | ICD-10-CM | POA: Diagnosis not present

## 2013-11-19 DIAGNOSIS — I350 Nonrheumatic aortic (valve) stenosis: Secondary | ICD-10-CM

## 2013-11-19 DIAGNOSIS — I6529 Occlusion and stenosis of unspecified carotid artery: Secondary | ICD-10-CM | POA: Diagnosis not present

## 2013-11-19 NOTE — Assessment & Plan Note (Signed)
Mild stenosis on recent echocardiogram in 2015

## 2013-11-19 NOTE — Progress Notes (Signed)
HPI Comments: [name] is a very pleasant 80 year old male with a history of CLL (WBC ~ 50k), coronary artery disease, status post bypass surgery in 2003. Myoview in September 2008, which showed ejection fraction of 63% with no ischemia or infarct.  history of hyperlipidemia, hypertension,  carotid stensosis and minimal bilateral renal artery stenosis.  He presents for followup after recent episode of chest pain  He states that yesterday he was walking on his treadmill. At lower speeds he felt fine but when he increase the speed of the treadmill he started developing chest pain, chest pressure. He stepped off the treadmill and symptoms improved. He got on and off the treadmill for at least 3 times and each time with the higher speed he developed chest pressure. He was able to walk at a lower speed for the rest of his workout without recurrent chest pain symptoms. He did walk on a treadmill this morning at a low speed with no chest pain. This was the only time he has had chest pain. Other than that he's been feeling well with no complaints. Possibly with mild fatigue since yesterday.  Recent echocardiogram showing normal ejection fraction, mild aortic valve stenosis  Carotid u/s 7829: RICA 56-21% LICA 40 to 30%.   EKG shows normal sinus rhythm with rate 64 beats per minute with no significant ST or T wave changes   Outpatient Encounter Prescriptions as of 11/19/2013  Medication Sig  . allopurinol (ZYLOPRIM) 100 MG tablet Take 100 mg by mouth daily.  . bendamustine in sodium chloride 0.9 % 500 mL Chemo infusion started 10/2013  . lisinopril (PRINIVIL,ZESTRIL) 20 MG tablet Take 1 tablet (20 mg total) by mouth daily.  . nitroGLYCERIN (NITROSTAT) 0.4 MG SL tablet Place 1 tablet (0.4 mg total) under the tongue every 5 (five) minutes as needed for chest pain (max 3 doses in 15 minutes).  . riTUXimab (RITUXAN) 10 MG/ML injection Chemo infusion started 10/2013  . simvastatin (ZOCOR) 20 MG tablet TAKE 1  TABLET AT BEDTIME  . valACYclovir (VALTREX) 500 MG tablet TAKE 1 TABLET DAILY     Review of Systems  Constitutional: Negative.   HENT: Negative.   Eyes: Negative.   Respiratory: Negative.   Cardiovascular: Negative.   Gastrointestinal: Negative.   Endocrine: Negative.   Musculoskeletal: Negative.   Skin: Negative.   Allergic/Immunologic: Negative.   Neurological: Negative.   Hematological: Negative.   Psychiatric/Behavioral: Negative.   All other systems reviewed and are negative.   BP 112/60  Pulse 64  Ht 5\' 9"  (1.753 m)  Wt 165 lb 4 oz (74.957 kg)  BMI 24.39 kg/m2  Physical Exam  Nursing note and vitals reviewed. Constitutional: He is oriented to person, place, and time. He appears well-developed and well-nourished.  HENT:  Head: Normocephalic.  Nose: Nose normal.  Mouth/Throat: Oropharynx is clear and moist.  Eyes: Conjunctivae are normal. Pupils are equal, round, and reactive to light.  Neck: Normal range of motion. Neck supple. No JVD present.  Cardiovascular: Normal rate, regular rhythm, S1 normal, S2 normal, normal heart sounds and intact distal pulses.  Exam reveals no gallop and no friction rub.   No murmur heard. Pulmonary/Chest: Effort normal and breath sounds normal. No respiratory distress. He has no wheezes. He has no rales. He exhibits no tenderness.  Abdominal: Soft. Bowel sounds are normal. He exhibits no distension. There is no tenderness.  Musculoskeletal: Normal range of motion. He exhibits no edema and no tenderness.  Lymphadenopathy:    He has no  cervical adenopathy.  Neurological: He is alert and oriented to person, place, and time. Coordination normal.  Skin: Skin is warm and dry. No rash noted. No erythema.  Psychiatric: He has a normal mood and affect. His behavior is normal. Judgment and thought content normal.      Assessment and Plan

## 2013-11-19 NOTE — Patient Instructions (Addendum)
You are doing well. No medication changes were made.  We will schedule a treadmill myoview stress test for next week Please hold your caffeine after Monday morning No food the morning of the test  Please take nitro for chest pain as needed  Please call us if you have new issues that need to be addressed before your next appt.  Your physician wants you to follow-up in: 6 months.  You will receive a reminder letter in the mail two months in advance. If you don't receive a letter, please call our office to schedule the follow-up appointment.  [place]  Your caregiver has ordered a Stress Test with nuclear imaging. The purpose of this test is to evaluate the blood supply to your heart muscle. This procedure is referred to as a "Non-Invasive Stress Test." This is because other than having an IV started in your vein, nothing is inserted or "invades" your body. Cardiac stress tests are done to find areas of poor blood flow to the heart by determining the extent of coronary artery disease (CAD). Some patients exercise on a treadmill, which naturally increases the blood flow to your heart, while others who are  unable to walk on a treadmill due to physical limitations have a pharmacologic/chemical stress agent called Lexiscan . This medicine will mimic walking on a treadmill by temporarily increasing your coronary blood flow.   Please note: these test may take anywhere between 2-4 hours to complete  PLEASE REPORT TO [facility] AT THE FIRST DESK WILL DIRECT YOU WHERE TO GO  Date of Procedure:_____Tuesday, August 27___________  Arrival Time for Procedure:___9:45am_________________   PLEASE NOTIFY THE OFFICE AT LEAST 24 HOURS IN ADVANCE IF YOU ARE UNABLE TO KEEP YOUR APPOINTMENT.  [phone] AND  PLEASE NOTIFY NUCLEAR MEDICINE AT [facility] AT LEAST 24 HOURS IN ADVANCE IF YOU ARE UNABLE TO KEEP YOUR APPOINTMENT. [phone]  How to prepare for your Myoview  test:  1. Do not eat or drink after midnight 2. No caffeine for 24 hours prior to test 3. No smoking 24 hours prior to test. 4. Your medication may be taken with water.  If your doctor stopped a medication because of this test, do not take that medication. 5. Ladies, please do not wear dresses.  Skirts or pants are appropriate. Please wear a short sleeve shirt. 6. No perfume, cologne or lotion. 7. Wear comfortable walking shoes. No heels!

## 2013-11-19 NOTE — Assessment & Plan Note (Signed)
Blood pressure is well controlled on today's visit. No changes made to the medications. 

## 2013-11-19 NOTE — Assessment & Plan Note (Signed)
Bypass surgery 12 years ago. Stress test ordered to rule out ischemia

## 2013-11-19 NOTE — Assessment & Plan Note (Signed)
Significant carotid arterial disease discussed with him today. Relatively stable. Cholesterol at goal

## 2013-11-19 NOTE — Assessment & Plan Note (Signed)
With exertion, concern for cardiac source given the hx of CAD.  No acute changes on EKG, compared to prev.  No pain/pressure at all now.  D/w pt.  Limit exertion, use NTG prn with routine cautions.  Will set up cards eval. Notified PCP as FYI.   hgb 11.1 at [facility] 11/17/13.

## 2013-11-19 NOTE — Assessment & Plan Note (Signed)
Etiology of his chest pressure is unclear. We have scheduled him for a treadmill Myoview next week. He has nitroglycerin. Recommended he hold off on high intensity treadmill workout for now

## 2013-11-23 ENCOUNTER — Ambulatory Visit: Payer: Self-pay | Admitting: Cardiovascular Disease

## 2013-11-23 DIAGNOSIS — I209 Angina pectoris, unspecified: Secondary | ICD-10-CM | POA: Diagnosis not present

## 2013-11-23 DIAGNOSIS — R079 Chest pain, unspecified: Secondary | ICD-10-CM | POA: Diagnosis not present

## 2013-11-24 ENCOUNTER — Other Ambulatory Visit: Payer: Self-pay

## 2013-11-24 DIAGNOSIS — R0789 Other chest pain: Secondary | ICD-10-CM

## 2013-11-24 DIAGNOSIS — C9110 Chronic lymphocytic leukemia of B-cell type not having achieved remission: Secondary | ICD-10-CM | POA: Diagnosis not present

## 2013-11-26 ENCOUNTER — Telehealth: Payer: Self-pay

## 2013-11-26 NOTE — Telephone Encounter (Signed)
Reviewed results w/ pt.  

## 2013-11-26 NOTE — Telephone Encounter (Signed)
Pt would like stress test results. Please call. 

## 2013-12-01 DIAGNOSIS — C9110 Chronic lymphocytic leukemia of B-cell type not having achieved remission: Secondary | ICD-10-CM | POA: Diagnosis not present

## 2013-12-08 DIAGNOSIS — C9110 Chronic lymphocytic leukemia of B-cell type not having achieved remission: Secondary | ICD-10-CM | POA: Diagnosis not present

## 2013-12-15 DIAGNOSIS — C9110 Chronic lymphocytic leukemia of B-cell type not having achieved remission: Secondary | ICD-10-CM | POA: Diagnosis not present

## 2013-12-15 DIAGNOSIS — R5381 Other malaise: Secondary | ICD-10-CM | POA: Diagnosis not present

## 2013-12-15 DIAGNOSIS — Z8619 Personal history of other infectious and parasitic diseases: Secondary | ICD-10-CM | POA: Diagnosis not present

## 2013-12-15 DIAGNOSIS — Z85828 Personal history of other malignant neoplasm of skin: Secondary | ICD-10-CM | POA: Diagnosis not present

## 2013-12-15 DIAGNOSIS — Z7982 Long term (current) use of aspirin: Secondary | ICD-10-CM | POA: Diagnosis not present

## 2013-12-15 DIAGNOSIS — Z5111 Encounter for antineoplastic chemotherapy: Secondary | ICD-10-CM | POA: Diagnosis not present

## 2013-12-15 DIAGNOSIS — I10 Essential (primary) hypertension: Secondary | ICD-10-CM | POA: Diagnosis not present

## 2013-12-15 DIAGNOSIS — Z006 Encounter for examination for normal comparison and control in clinical research program: Secondary | ICD-10-CM | POA: Diagnosis not present

## 2013-12-16 DIAGNOSIS — Z5111 Encounter for antineoplastic chemotherapy: Secondary | ICD-10-CM | POA: Diagnosis not present

## 2013-12-16 DIAGNOSIS — Z006 Encounter for examination for normal comparison and control in clinical research program: Secondary | ICD-10-CM | POA: Diagnosis not present

## 2013-12-16 DIAGNOSIS — C9110 Chronic lymphocytic leukemia of B-cell type not having achieved remission: Secondary | ICD-10-CM | POA: Diagnosis not present

## 2013-12-22 DIAGNOSIS — C9110 Chronic lymphocytic leukemia of B-cell type not having achieved remission: Secondary | ICD-10-CM | POA: Diagnosis not present

## 2013-12-29 ENCOUNTER — Ambulatory Visit (INDEPENDENT_AMBULATORY_CARE_PROVIDER_SITE_OTHER): Payer: Medicare Other

## 2013-12-29 DIAGNOSIS — Z23 Encounter for immunization: Secondary | ICD-10-CM | POA: Diagnosis not present

## 2013-12-29 DIAGNOSIS — C9110 Chronic lymphocytic leukemia of B-cell type not having achieved remission: Secondary | ICD-10-CM | POA: Diagnosis not present

## 2014-01-05 DIAGNOSIS — C9110 Chronic lymphocytic leukemia of B-cell type not having achieved remission: Secondary | ICD-10-CM | POA: Diagnosis not present

## 2014-01-06 ENCOUNTER — Other Ambulatory Visit: Payer: Self-pay

## 2014-01-06 MED ORDER — VALACYCLOVIR HCL 500 MG PO TABS: ORAL_TABLET | ORAL | Status: DC

## 2014-01-06 NOTE — Telephone Encounter (Signed)
Pt left note requesting [facility].Please advise.

## 2014-01-06 NOTE — Telephone Encounter (Signed)
Pt notified [facility]; pt voiced understanding.

## 2014-01-11 DIAGNOSIS — N189 Chronic kidney disease, unspecified: Secondary | ICD-10-CM | POA: Diagnosis not present

## 2014-01-11 DIAGNOSIS — D7389 Other diseases of spleen: Secondary | ICD-10-CM | POA: Diagnosis not present

## 2014-01-11 DIAGNOSIS — C9110 Chronic lymphocytic leukemia of B-cell type not having achieved remission: Secondary | ICD-10-CM | POA: Diagnosis not present

## 2014-01-11 DIAGNOSIS — Z006 Encounter for examination for normal comparison and control in clinical research program: Secondary | ICD-10-CM | POA: Diagnosis not present

## 2014-01-11 DIAGNOSIS — I700 Atherosclerosis of aorta: Secondary | ICD-10-CM | POA: Diagnosis not present

## 2014-01-12 DIAGNOSIS — R930 Abnormal findings on diagnostic imaging of skull and head, not elsewhere classified: Secondary | ICD-10-CM | POA: Diagnosis not present

## 2014-01-12 DIAGNOSIS — Z5111 Encounter for antineoplastic chemotherapy: Secondary | ICD-10-CM | POA: Diagnosis not present

## 2014-01-12 DIAGNOSIS — C9110 Chronic lymphocytic leukemia of B-cell type not having achieved remission: Secondary | ICD-10-CM | POA: Diagnosis not present

## 2014-01-12 DIAGNOSIS — Z006 Encounter for examination for normal comparison and control in clinical research program: Secondary | ICD-10-CM | POA: Diagnosis not present

## 2014-01-13 DIAGNOSIS — C9110 Chronic lymphocytic leukemia of B-cell type not having achieved remission: Secondary | ICD-10-CM | POA: Diagnosis not present

## 2014-01-13 DIAGNOSIS — Z006 Encounter for examination for normal comparison and control in clinical research program: Secondary | ICD-10-CM | POA: Diagnosis not present

## 2014-01-13 DIAGNOSIS — Z5111 Encounter for antineoplastic chemotherapy: Secondary | ICD-10-CM | POA: Diagnosis not present

## 2014-01-19 DIAGNOSIS — L578 Other skin changes due to chronic exposure to nonionizing radiation: Secondary | ICD-10-CM | POA: Diagnosis not present

## 2014-01-19 DIAGNOSIS — L820 Inflamed seborrheic keratosis: Secondary | ICD-10-CM | POA: Diagnosis not present

## 2014-01-19 DIAGNOSIS — L821 Other seborrheic keratosis: Secondary | ICD-10-CM | POA: Diagnosis not present

## 2014-01-19 DIAGNOSIS — C9110 Chronic lymphocytic leukemia of B-cell type not having achieved remission: Secondary | ICD-10-CM | POA: Diagnosis not present

## 2014-01-19 DIAGNOSIS — D229 Melanocytic nevi, unspecified: Secondary | ICD-10-CM | POA: Diagnosis not present

## 2014-01-19 DIAGNOSIS — L814 Other melanin hyperpigmentation: Secondary | ICD-10-CM | POA: Diagnosis not present

## 2014-01-19 DIAGNOSIS — C44319 Basal cell carcinoma of skin of other parts of face: Secondary | ICD-10-CM | POA: Diagnosis not present

## 2014-01-19 DIAGNOSIS — L570 Actinic keratosis: Secondary | ICD-10-CM | POA: Diagnosis not present

## 2014-01-19 DIAGNOSIS — D485 Neoplasm of uncertain behavior of skin: Secondary | ICD-10-CM | POA: Diagnosis not present

## 2014-01-26 DIAGNOSIS — I6782 Cerebral ischemia: Secondary | ICD-10-CM | POA: Diagnosis not present

## 2014-01-26 DIAGNOSIS — R930 Abnormal findings on diagnostic imaging of skull and head, not elsewhere classified: Secondary | ICD-10-CM | POA: Diagnosis not present

## 2014-01-29 ENCOUNTER — Encounter: Payer: Self-pay | Admitting: Family Medicine

## 2014-02-02 DIAGNOSIS — C9110 Chronic lymphocytic leukemia of B-cell type not having achieved remission: Secondary | ICD-10-CM | POA: Diagnosis not present

## 2014-02-09 DIAGNOSIS — Z006 Encounter for examination for normal comparison and control in clinical research program: Secondary | ICD-10-CM | POA: Diagnosis not present

## 2014-02-09 DIAGNOSIS — C9110 Chronic lymphocytic leukemia of B-cell type not having achieved remission: Secondary | ICD-10-CM | POA: Diagnosis not present

## 2014-02-09 DIAGNOSIS — N189 Chronic kidney disease, unspecified: Secondary | ICD-10-CM | POA: Diagnosis not present

## 2014-02-09 DIAGNOSIS — Z8673 Personal history of transient ischemic attack (TIA), and cerebral infarction without residual deficits: Secondary | ICD-10-CM | POA: Diagnosis not present

## 2014-02-09 DIAGNOSIS — I10 Essential (primary) hypertension: Secondary | ICD-10-CM | POA: Diagnosis not present

## 2014-02-09 DIAGNOSIS — Z7982 Long term (current) use of aspirin: Secondary | ICD-10-CM | POA: Diagnosis not present

## 2014-02-09 DIAGNOSIS — R221 Localized swelling, mass and lump, neck: Secondary | ICD-10-CM | POA: Diagnosis not present

## 2014-02-09 DIAGNOSIS — Z85828 Personal history of other malignant neoplasm of skin: Secondary | ICD-10-CM | POA: Diagnosis not present

## 2014-02-09 DIAGNOSIS — Z8619 Personal history of other infectious and parasitic diseases: Secondary | ICD-10-CM | POA: Diagnosis not present

## 2014-02-09 DIAGNOSIS — Z5111 Encounter for antineoplastic chemotherapy: Secondary | ICD-10-CM | POA: Diagnosis not present

## 2014-02-10 DIAGNOSIS — Z5111 Encounter for antineoplastic chemotherapy: Secondary | ICD-10-CM | POA: Diagnosis not present

## 2014-02-10 DIAGNOSIS — Z006 Encounter for examination for normal comparison and control in clinical research program: Secondary | ICD-10-CM | POA: Diagnosis not present

## 2014-02-10 DIAGNOSIS — C9110 Chronic lymphocytic leukemia of B-cell type not having achieved remission: Secondary | ICD-10-CM | POA: Diagnosis not present

## 2014-02-16 DIAGNOSIS — C9110 Chronic lymphocytic leukemia of B-cell type not having achieved remission: Secondary | ICD-10-CM | POA: Diagnosis not present

## 2014-02-25 ENCOUNTER — Encounter: Payer: Self-pay | Admitting: Family Medicine

## 2014-02-25 ENCOUNTER — Ambulatory Visit (INDEPENDENT_AMBULATORY_CARE_PROVIDER_SITE_OTHER): Payer: Medicare Other | Admitting: Family Medicine

## 2014-02-25 ENCOUNTER — Other Ambulatory Visit (INDEPENDENT_AMBULATORY_CARE_PROVIDER_SITE_OTHER): Payer: Medicare Other

## 2014-02-25 VITALS — BP 130/62 | HR 84 | Temp 98.1°F | Ht 69.0 in | Wt 162.0 lb

## 2014-02-25 DIAGNOSIS — J019 Acute sinusitis, unspecified: Secondary | ICD-10-CM | POA: Insufficient documentation

## 2014-02-25 DIAGNOSIS — R7989 Other specified abnormal findings of blood chemistry: Secondary | ICD-10-CM

## 2014-02-25 DIAGNOSIS — J0100 Acute maxillary sinusitis, unspecified: Secondary | ICD-10-CM

## 2014-02-25 DIAGNOSIS — I10 Essential (primary) hypertension: Secondary | ICD-10-CM

## 2014-02-25 DIAGNOSIS — I2510 Atherosclerotic heart disease of native coronary artery without angina pectoris: Secondary | ICD-10-CM | POA: Diagnosis not present

## 2014-02-25 LAB — CBC WITH DIFFERENTIAL/PLATELET
Basophils Relative: 0 % (ref 0.0–3.0)
Eosinophils Relative: 1 % (ref 0.0–5.0)
HCT: 39.6 % (ref 39.0–52.0)
Hemoglobin: 13 g/dL (ref 13.0–17.0)
Lymphocytes Relative: 9 % — ABNORMAL LOW (ref 12.0–46.0)
MCHC: 32.8 g/dL (ref 30.0–36.0)
MCV: 87.6 fl (ref 78.0–100.0)
Monocytes Relative: 14 % — ABNORMAL HIGH (ref 3.0–12.0)
Neutrophils Relative %: 76 % (ref 43.0–77.0)
Platelets: 119 10*3/uL — ABNORMAL LOW (ref 150.0–400.0)
RBC: 4.52 Mil/uL (ref 4.22–5.81)
RDW: 17.4 % — ABNORMAL HIGH (ref 11.5–15.5)
WBC: 4.2 10*3/uL (ref 4.0–10.5)

## 2014-02-25 MED ORDER — AMOXICILLIN 500 MG PO CAPS
500.0000 mg | ORAL_CAPSULE | Freq: Three times a day (TID) | ORAL | Status: DC
Start: 2014-02-25 — End: 2014-04-18

## 2014-02-25 MED ORDER — HYDROCODONE-HOMATROPINE 5-1.5 MG/5ML PO SYRP: 5.0000 mL | ORAL_SOLUTION | Freq: Three times a day (TID) | ORAL | Status: DC | PRN

## 2014-02-25 NOTE — Progress Notes (Signed)
   Subjective:    [name], male    DOB: 1928-06-13, 80 y.o.   MRN: [id number]  HPI  Patient is a 80 year old male in today for routine medical care. He claims a 6 day history of worsening sinus pressure, congestion, PND, cough productive of green phlegm. No f/c/n/v/diarrhea. Cough is beginning to keep him up at night and cause chest pain.   Review of Systems  Constitutional: Positive for fatigue. Negative for fever and chills.  HENT: Positive for congestion, postnasal drip, rhinorrhea, sinus pressure and sore throat. Negative for ear discharge, ear pain, facial swelling, mouth sores and nosebleeds.   Respiratory: Positive for cough. Negative for choking, chest tightness and wheezing.   Cardiovascular: Positive for chest pain. Negative for palpitations.  Gastrointestinal: Negative for nausea, vomiting, diarrhea and abdominal distention.  Musculoskeletal: Negative for back pain.  Neurological: Negative for weakness.  Psychiatric/Behavioral: Negative for confusion.       Objective:   Physical Exam  Constitutional: He is oriented to person, place, and time. He appears well-developed and well-nourished. No distress.  HENT:  Head: Normocephalic and atraumatic.  Oro pharynx erythematous, sinus pressure with palpation over maxillary sinuses Nasal mucosa boggy and erythematous  Eyes: Conjunctivae are normal.  Neck: Neck supple. No thyromegaly present.  Cardiovascular: Normal rate, regular rhythm and normal heart sounds.   No murmur heard. Pulmonary/Chest: Effort normal and breath sounds normal. No respiratory distress. He has no wheezes.  Abdominal: Soft. Bowel sounds are normal. He exhibits no mass. There is no tenderness.  Musculoskeletal: He exhibits no edema.  Lymphadenopathy:    He has no cervical adenopathy.  Neurological: He is alert and oriented to person, place, and time.  Skin: Skin is warm and dry.  Psychiatric: He has a normal mood and affect. His  behavior is normal.          Assessment & Plan:    Essential hypertension Well controlled, no changes to meds  Sinusitis, acute Encouraged increased rest and hydration, add probiotics, zinc such as Coldeze or Xicam. Treat fevers as needed. Started on Amoxicillin 500 mg pot di and Hydromet prn.

## 2014-02-25 NOTE — Progress Notes (Signed)
Pre visit review using our clinic review tool, if applicable. No additional management support is needed unless otherwise documented below in the visit note. 

## 2014-02-25 NOTE — Assessment & Plan Note (Signed)
Well controlled, no changes to meds 

## 2014-02-25 NOTE — Patient Instructions (Signed)
Encouraged increased rest and hydration, add probiotics such as Digestive Advantage   Sinusitis Sinusitis is redness, soreness, and inflammation of the paranasal sinuses. Paranasal sinuses are air pockets within the bones of your face (beneath the eyes, the middle of the forehead, or above the eyes). In healthy paranasal sinuses, mucus is able to drain out, and air is able to circulate through them by way of your nose. However, when your paranasal sinuses are inflamed, mucus and air can become trapped. This can allow bacteria and other germs to grow and cause infection. Sinusitis can develop quickly and last only a short time (acute) or continue over a long period (chronic). Sinusitis that lasts for more than 12 weeks is considered chronic.  CAUSES  Causes of sinusitis include:  Allergies.  Structural abnormalities, such as displacement of the cartilage that separates your nostrils (deviated septum), which can decrease the air flow through your nose and sinuses and affect sinus drainage.  Functional abnormalities, such as when the small hairs (cilia) that line your sinuses and help remove mucus do not work properly or are not present. SIGNS AND SYMPTOMS  Symptoms of acute and chronic sinusitis are the same. The primary symptoms are pain and pressure around the affected sinuses. Other symptoms include:  Upper toothache.  Earache.  Headache.  Bad breath.  Decreased sense of smell and taste.  A cough, which worsens when you are lying flat.  Fatigue.  Fever.  Thick drainage from your nose, which often is green and may contain pus (purulent).  Swelling and warmth over the affected sinuses. DIAGNOSIS  Your health care provider will perform a physical exam. During the exam, your health care provider may:  Look in your nose for signs of abnormal growths in your nostrils (nasal polyps).  Tap over the affected sinus to check for signs of infection.  View the inside of your sinuses  (endoscopy) using an imaging device that has a light attached (endoscope). If your health care provider suspects that you have chronic sinusitis, one or more of the following tests may be recommended:  Allergy tests.  Nasal culture. A sample of mucus is taken from your nose, sent to a lab, and screened for bacteria.  Nasal cytology. A sample of mucus is taken from your nose and examined by your health care provider to determine if your sinusitis is related to an allergy. TREATMENT  Most cases of acute sinusitis are related to a viral infection and will resolve on their own within 10 days. Sometimes medicines are prescribed to help relieve symptoms (pain medicine, decongestants, nasal steroid sprays, or saline sprays).  However, for sinusitis related to a bacterial infection, your health care provider will prescribe antibiotic medicines. These are medicines that will help kill the bacteria causing the infection.  Rarely, sinusitis is caused by a fungal infection. In theses cases, your health care provider will prescribe antifungal medicine. For some cases of chronic sinusitis, surgery is needed. Generally, these are cases in which sinusitis recurs more than 3 times per year, despite other treatments. HOME CARE INSTRUCTIONS   Drink plenty of water. Water helps thin the mucus so your sinuses can drain more easily.  Use a humidifier.  Inhale steam 3 to 4 times a day (for example, sit in the bathroom with the shower running).  Apply a warm, moist washcloth to your face 3 to 4 times a day, or as directed by your health care provider.  Use saline nasal sprays to help moisten and clean your  sinuses.  Take medicines only as directed by your health care provider.  If you were prescribed either an antibiotic or antifungal medicine, finish it all even if you start to feel better. SEEK IMMEDIATE MEDICAL CARE IF:  You have increasing pain or severe headaches.  You have nausea, vomiting, or  drowsiness.  You have swelling around your face.  You have vision problems.  You have a stiff neck.  You have difficulty breathing. MAKE SURE YOU:   Understand these instructions.  Will watch your condition.  Will get help right away if you are not doing well or get worse. Document Released: 03/18/2005 Document Revised: 08/02/2013 Document Reviewed: 04/02/2011 [facility] Patient Information 2015 [place], [place]. This information is not intended to replace advice given to you by your health care provider. Make sure you discuss any questions you have with your health care provider. , zinc such as Coldeze or Xicam. Treat fevers as needed

## 2014-02-25 NOTE — Assessment & Plan Note (Signed)
Encouraged increased rest and hydration, add probiotics, zinc such as Coldeze or Xicam. Treat fevers as needed. Started on Amoxicillin 500 mg pot di and Hydromet prn.

## 2014-03-02 DIAGNOSIS — C9110 Chronic lymphocytic leukemia of B-cell type not having achieved remission: Secondary | ICD-10-CM | POA: Diagnosis not present

## 2014-03-08 DIAGNOSIS — C44319 Basal cell carcinoma of skin of other parts of face: Secondary | ICD-10-CM | POA: Diagnosis not present

## 2014-03-08 DIAGNOSIS — C4491 Basal cell carcinoma of skin, unspecified: Secondary | ICD-10-CM

## 2014-03-08 HISTORY — DX: Basal cell carcinoma of skin, unspecified: C44.91

## 2014-03-09 DIAGNOSIS — Z006 Encounter for examination for normal comparison and control in clinical research program: Secondary | ICD-10-CM | POA: Diagnosis not present

## 2014-03-09 DIAGNOSIS — N189 Chronic kidney disease, unspecified: Secondary | ICD-10-CM | POA: Diagnosis not present

## 2014-03-09 DIAGNOSIS — E780 Pure hypercholesterolemia: Secondary | ICD-10-CM | POA: Diagnosis not present

## 2014-03-09 DIAGNOSIS — Q892 Congenital malformations of other endocrine glands: Secondary | ICD-10-CM | POA: Insufficient documentation

## 2014-03-09 DIAGNOSIS — C9110 Chronic lymphocytic leukemia of B-cell type not having achieved remission: Secondary | ICD-10-CM | POA: Diagnosis not present

## 2014-03-09 DIAGNOSIS — I129 Hypertensive chronic kidney disease with stage 1 through stage 4 chronic kidney disease, or unspecified chronic kidney disease: Secondary | ICD-10-CM | POA: Diagnosis not present

## 2014-03-09 DIAGNOSIS — Z5111 Encounter for antineoplastic chemotherapy: Secondary | ICD-10-CM | POA: Diagnosis not present

## 2014-03-09 DIAGNOSIS — Z85828 Personal history of other malignant neoplasm of skin: Secondary | ICD-10-CM | POA: Diagnosis not present

## 2014-03-10 DIAGNOSIS — Z006 Encounter for examination for normal comparison and control in clinical research program: Secondary | ICD-10-CM | POA: Diagnosis not present

## 2014-03-10 DIAGNOSIS — C9110 Chronic lymphocytic leukemia of B-cell type not having achieved remission: Secondary | ICD-10-CM | POA: Diagnosis not present

## 2014-03-14 DIAGNOSIS — C44319 Basal cell carcinoma of skin of other parts of face: Secondary | ICD-10-CM | POA: Diagnosis not present

## 2014-03-16 DIAGNOSIS — C9110 Chronic lymphocytic leukemia of B-cell type not having achieved remission: Secondary | ICD-10-CM | POA: Diagnosis not present

## 2014-03-23 DIAGNOSIS — C9110 Chronic lymphocytic leukemia of B-cell type not having achieved remission: Secondary | ICD-10-CM | POA: Diagnosis not present

## 2014-03-30 DIAGNOSIS — C9110 Chronic lymphocytic leukemia of B-cell type not having achieved remission: Secondary | ICD-10-CM | POA: Diagnosis not present

## 2014-04-02 ENCOUNTER — Encounter: Payer: Self-pay | Admitting: Family Medicine

## 2014-04-02 DIAGNOSIS — Z8673 Personal history of transient ischemic attack (TIA), and cerebral infarction without residual deficits: Secondary | ICD-10-CM

## 2014-04-02 DIAGNOSIS — I6930 Unspecified sequelae of cerebral infarction: Secondary | ICD-10-CM | POA: Insufficient documentation

## 2014-04-02 DIAGNOSIS — R220 Localized swelling, mass and lump, head: Secondary | ICD-10-CM | POA: Insufficient documentation

## 2014-04-06 DIAGNOSIS — C9110 Chronic lymphocytic leukemia of B-cell type not having achieved remission: Secondary | ICD-10-CM | POA: Diagnosis not present

## 2014-04-13 DIAGNOSIS — C9110 Chronic lymphocytic leukemia of B-cell type not having achieved remission: Secondary | ICD-10-CM | POA: Diagnosis not present

## 2014-04-18 ENCOUNTER — Ambulatory Visit (INDEPENDENT_AMBULATORY_CARE_PROVIDER_SITE_OTHER): Payer: Medicare Other | Admitting: Family Medicine

## 2014-04-18 ENCOUNTER — Encounter: Payer: Self-pay | Admitting: Family Medicine

## 2014-04-18 VITALS — BP 104/54 | HR 101 | Temp 98.6°F | Wt 163.2 lb

## 2014-04-18 DIAGNOSIS — R05 Cough: Secondary | ICD-10-CM

## 2014-04-18 DIAGNOSIS — R059 Cough, unspecified: Secondary | ICD-10-CM

## 2014-04-18 NOTE — Progress Notes (Signed)
Pre visit review using our clinic review tool, if applicable. No additional management support is needed unless otherwise documented below in the visit note.  H/o CLL, no on treatment now.   H/o pet dander related sneezing at baseline.  More sx in the last 3 days.  More cough, some ST, some chest soreness likely from coughing.  Minimal sputum.  No fevers known.  Some rhinorrhea, no ear pain.  No facial pain. No vomiting, no rash, no diarrhea.   Taking OTC cough syrup in the meantime, some relief.  Mildly lightheaded, no syncope.  Urine is still light yellow.    Meds, vitals, and allergies reviewed.   ROS: See HPI.  Otherwise, noncontributory.  GEN: nad, alert and oriented HEENT: mucous membranes moist, tm w/o erythema, nasal exam w/o erythema, clear discharge noted,  OP with cobblestoning NECK: supple w/o LA CV: rrr.   PULM: totally ctab, no inc wob, no rales EXT: no edema SKIN: no acute rash

## 2014-04-18 NOTE — Patient Instructions (Signed)
Keep using your cough medicine and update me a day or so if you have more troubles.  I would skip your lisinopril tomorrow and drink plenty of fluids in the meantime.  Take care.  Glad to see you.

## 2014-04-20 ENCOUNTER — Telehealth: Payer: Self-pay | Admitting: Family Medicine

## 2014-04-20 DIAGNOSIS — R05 Cough: Secondary | ICD-10-CM | POA: Insufficient documentation

## 2014-04-20 DIAGNOSIS — R059 Cough, unspecified: Secondary | ICD-10-CM | POA: Insufficient documentation

## 2014-04-20 MED ORDER — DOXYCYCLINE HYCLATE 100 MG PO TABS: 100.0000 mg | ORAL_TABLET | Freq: Two times a day (BID) | ORAL | Status: DC

## 2014-04-20 NOTE — Telephone Encounter (Signed)
Pt called wanting to let Dr Damita Dunnings his condition has not changed.

## 2014-04-20 NOTE — Assessment & Plan Note (Signed)
Off chemo currently, looks remarkable well in spite of sx.  Benign exam, would hold off on abx treatment for now, he'll update me. If worsening or not better, then would treat.  Supportive care for now.  He agrees.

## 2014-04-20 NOTE — Telephone Encounter (Signed)
Patient notified as instructed and verbalized understanding.

## 2014-04-20 NOTE — Telephone Encounter (Signed)
Would start doxy in the meantime.  Sent to [facility].  Thanks.

## 2014-04-21 DIAGNOSIS — C9110 Chronic lymphocytic leukemia of B-cell type not having achieved remission: Secondary | ICD-10-CM | POA: Diagnosis not present

## 2014-04-27 DIAGNOSIS — C9110 Chronic lymphocytic leukemia of B-cell type not having achieved remission: Secondary | ICD-10-CM | POA: Diagnosis not present

## 2014-05-04 DIAGNOSIS — C9110 Chronic lymphocytic leukemia of B-cell type not having achieved remission: Secondary | ICD-10-CM | POA: Diagnosis not present

## 2014-05-11 DIAGNOSIS — C9110 Chronic lymphocytic leukemia of B-cell type not having achieved remission: Secondary | ICD-10-CM | POA: Diagnosis not present

## 2014-05-18 ENCOUNTER — Ambulatory Visit (INDEPENDENT_AMBULATORY_CARE_PROVIDER_SITE_OTHER): Payer: Medicare Other | Admitting: Cardiovascular Disease

## 2014-05-18 ENCOUNTER — Encounter: Payer: Self-pay | Admitting: Cardiovascular Disease

## 2014-05-18 VITALS — BP 140/64 | HR 68 | Ht 69.5 in | Wt 165.8 lb

## 2014-05-18 DIAGNOSIS — C9110 Chronic lymphocytic leukemia of B-cell type not having achieved remission: Secondary | ICD-10-CM

## 2014-05-18 DIAGNOSIS — I10 Essential (primary) hypertension: Secondary | ICD-10-CM

## 2014-05-18 DIAGNOSIS — Z951 Presence of aortocoronary bypass graft: Secondary | ICD-10-CM | POA: Diagnosis not present

## 2014-05-18 DIAGNOSIS — E785 Hyperlipidemia, unspecified: Secondary | ICD-10-CM | POA: Diagnosis not present

## 2014-05-18 DIAGNOSIS — N521 Erectile dysfunction due to diseases classified elsewhere: Secondary | ICD-10-CM

## 2014-05-18 DIAGNOSIS — I6523 Occlusion and stenosis of bilateral carotid arteries: Secondary | ICD-10-CM

## 2014-05-18 DIAGNOSIS — I25709 Atherosclerosis of coronary artery bypass graft(s), unspecified, with unspecified angina pectoris: Secondary | ICD-10-CM | POA: Diagnosis not present

## 2014-05-18 MED ORDER — NITROGLYCERIN 0.4 MG SL SUBL: 0.4000 mg | SUBLINGUAL_TABLET | SUBLINGUAL | Status: DC | PRN

## 2014-05-18 NOTE — Assessment & Plan Note (Signed)
Stable disease by carotid ultrasound. Continue aggressive cholesterol management

## 2014-05-18 NOTE — Assessment & Plan Note (Signed)
Stable with no symptoms of angina. Recent negative stress test

## 2014-05-18 NOTE — Progress Notes (Signed)
HPI Comments: Jimmy [name] is a very pleasant 81 year-old male with a history of CLL (WBC ~ 50k), coronary artery disease, status post bypass surgery in 2003. Myoview in September 2008, which showed ejection fraction of 63% with no ischemia or infarct.  history of hyperlipidemia, hypertension,  carotid stensosis and minimal bilateral renal artery stenosis.  He has had 6 rounds of treatment of CLL, finished in December. Follow-up with hematology March 2016 He presents for followup of his coronary artery disease Negative stress test August 2015  In follow-up, he reports that he is doing well He recently completed 6 rounds of treatment, stopped in December 2015. He has follow-up with hematology oncology in March 2016 Otherwise relatively active, continues to exercise No further episodes of chest pain as he appreciated 6 months ago  Recent lab work showing total cholesterol of 80 Carotid ultrasound with right greater than left disease, stable 60% on the right, 40% on the left EKG on today's visit shows normal sinus rhythm with rate 68 bpm, no significant ST or T-wave changes  Previous echocardiogram showing normal ejection fraction, mild aortic valve stenosis Carotid u/s 7939: RICA 03-00% LICA 40 to 92%.    Allergies  Allergen Reactions  . New Skin     Outpatient Encounter Prescriptions as of 05/18/2014  Medication Sig  . allopurinol (ZYLOPRIM) 100 MG tablet Take 100 mg by mouth daily.  [name] lisinopril (PRINIVIL,ZESTRIL) 20 MG tablet Take 1 tablet (20 mg total) by mouth daily.  . nitroGLYCERIN (NITROSTAT) 0.4 MG SL tablet Place 1 tablet (0.4 mg total) under the tongue every 5 (five) minutes as needed for chest pain (max 3 doses in 15 minutes).  . simvastatin (ZOCOR) 20 MG tablet TAKE 1 TABLET AT BEDTIME  . valACYclovir (VALTREX) 500 MG tablet TAKE 1 TABLET DAILY  . [DISCONTINUED] nitroGLYCERIN (NITROSTAT) 0.4 MG SL tablet Place 1 tablet (0.4 mg total) under the tongue every 5 (five) minutes as  needed for chest pain (max 3 doses in 15 minutes).  [name] aspirin EC 81 MG tablet Take 1 tablet (81 mg total) by mouth daily.  . [DISCONTINUED] doxycycline (VIBRA-TABS) 100 MG tablet Take 1 tablet (100 mg total) by mouth 2 (two) times daily. (Patient not taking: Reported on 05/18/2014)    Past Medical History  Diagnosis Date  . CAD (coronary artery disease)     a. 04/2001 S/P CABGx 4;  b. 2008 MV:  EF 63% normal perfusion.  [name] GERD (gastroesophageal reflux disease) 1990s  . Hyperlipemia 2002  . Hypertension 2002  . CLL (chronic lymphoblastic leukemia) 2009    Stage IV; Dr. Ivor Messier - referred to Dr. Lissa Merlin [facility] 07/2013 now on Rituxan (10/2013)  . Thrombocytopenia     outpatient goals Hgb >9, plt >20  . History of herpes genitalis     valtrex daily  . Carotid stenosis     a. 11/2012 Carotid U/S:  RICA 33-00%, LICA 76-22%.  . Diastolic CHF     a. 08/3333 Echo: EF 55-65%, mild conc LVH, Gr 1 DD, mild AS, triv AI, mildly dil Ao root (3.5 cm).  . Mild aortic stenosis     a. 07/2011 Echo: Mild AS, triv AI.  [name] Chronic kidney disease, stage 3   . Mass of submandibular region 2015    planned f/u with surg after chemo  . History of CVA (cerebrovascular accident) 2015    by MRI    Past Surgical History  Procedure Laterality Date  . Hepatic artery angioplasty  1954  [place], [place]  . Hemorroidectomy  1954    Fissure repair, [place]  . Kidney stone surgery  1977    Dr Redmond Baseman  . Lithotripsy  1990s    Multiple  . Angioplasty  1998  . Coronary angioplasty  1998  . Coronary artery bypass graft  04/23/2001    x4, Dr. Pia Mau  . Esophagogastroduodenoscopy  11/29/1987    Gastritis  . Colonoscopy  11/29/1987    Normal  . Colonoscopy  02/07/2003    Hemm. Internal, focal proctitis, negative biopsy  . Colonoscopy  12/12/2004    Internal external hemorrhoids, +proctits, negative biopsy  . Cataract extraction, bilateral      R 1/09, L 8/09  . Ett  12/10/2006    Persantine myoview nml  . US  echocardiography  07/2011    nl sys fxn, EF 55-60%, grade 1 diast dysfunction, mild AS, mildly elevated PA pressure    Social History  reports that he quit smoking about 35 years ago. He has never used smokeless tobacco. He reports that he drinks alcohol. He reports that he does not use illicit drugs.  Family History family history includes Alcohol abuse in his brother; Depression in his daughter; Diabetes in his brother and other; Heart attack in his brother; Heart disease in his mother; Hyperlipidemia in his father and mother; Hypertension in his father and mother; Kidney cancer in his sister; Stroke in his brother.   Review of Systems  Constitutional: Negative.   Respiratory: Negative.   Cardiovascular: Negative.   Gastrointestinal: Negative.   Musculoskeletal: Negative.   Skin: Negative.   Neurological: Negative.   Hematological: Negative.   Psychiatric/Behavioral: Negative.   All other systems reviewed and are negative.   BP 140/64 mmHg  Pulse 68  Ht 5' 9.5" (1.765 m)  Wt 165 lb 12 oz (75.184 kg)  BMI 24.13 kg/m2  Physical Exam  Constitutional: He is oriented to person, place, and time. He appears well-developed and well-nourished.  HENT:  Head: Normocephalic.  Nose: Nose normal.  Mouth/Throat: Oropharynx is clear and moist.  Eyes: Conjunctivae are normal. Pupils are equal, round, and reactive to light.  Neck: Normal range of motion. Neck supple. No JVD present.  Cardiovascular: Normal rate, regular rhythm, S1 normal, S2 normal, normal heart sounds and intact distal pulses.  Exam reveals no gallop and no friction rub.   No murmur heard. Pulmonary/Chest: Effort normal and breath sounds normal. No respiratory distress. He has no wheezes. He has no rales. He exhibits no tenderness.  Abdominal: Soft. Bowel sounds are normal. He exhibits no distension. There is no tenderness.  Musculoskeletal: Normal range of motion. He exhibits no edema or tenderness.  Lymphadenopathy:     He has no cervical adenopathy.  Neurological: He is alert and oriented to person, place, and time. Coordination normal.  Skin: Skin is warm and dry. No rash noted. No erythema.  Psychiatric: He has a normal mood and affect. His behavior is normal. Judgment and thought content normal.      Assessment and Plan   Nursing note and vitals reviewed.

## 2014-05-18 NOTE — Assessment & Plan Note (Signed)
Blood pressure is well controlled on today's visit. No changes made to the medications. 

## 2014-05-18 NOTE — Assessment & Plan Note (Signed)
Currently with no symptoms of angina. No further workup at this time. Continue current medication regimen. 

## 2014-05-18 NOTE — Assessment & Plan Note (Signed)
Recently completed 6 rounds of therapy, follow-up with all college he March 2016

## 2014-05-18 NOTE — Assessment & Plan Note (Signed)
Cholesterol is at goal on the current lipid regimen. No changes to the medications were made. Suggested recheck a testosterone given total cholesterol level is very low

## 2014-05-18 NOTE — Patient Instructions (Signed)
You are doing well. No medication changes were made.  Please call us if you have new issues that need to be addressed before your next appt.  Your physician wants you to follow-up in: 6 months.  You will receive a reminder letter in the mail two months in advance. If you don't receive a letter, please call our office to schedule the follow-up appointment.   

## 2014-05-25 DIAGNOSIS — C9110 Chronic lymphocytic leukemia of B-cell type not having achieved remission: Secondary | ICD-10-CM | POA: Diagnosis not present

## 2014-06-01 DIAGNOSIS — Q892 Congenital malformations of other endocrine glands: Secondary | ICD-10-CM | POA: Diagnosis not present

## 2014-06-01 DIAGNOSIS — D485 Neoplasm of uncertain behavior of skin: Secondary | ICD-10-CM | POA: Diagnosis not present

## 2014-06-01 DIAGNOSIS — D696 Thrombocytopenia, unspecified: Secondary | ICD-10-CM | POA: Diagnosis not present

## 2014-06-01 DIAGNOSIS — Z006 Encounter for examination for normal comparison and control in clinical research program: Secondary | ICD-10-CM | POA: Diagnosis not present

## 2014-06-01 DIAGNOSIS — L578 Other skin changes due to chronic exposure to nonionizing radiation: Secondary | ICD-10-CM | POA: Diagnosis not present

## 2014-06-01 DIAGNOSIS — R161 Splenomegaly, not elsewhere classified: Secondary | ICD-10-CM | POA: Diagnosis not present

## 2014-06-01 DIAGNOSIS — C9110 Chronic lymphocytic leukemia of B-cell type not having achieved remission: Secondary | ICD-10-CM | POA: Diagnosis not present

## 2014-06-01 DIAGNOSIS — Z85828 Personal history of other malignant neoplasm of skin: Secondary | ICD-10-CM | POA: Diagnosis not present

## 2014-06-01 DIAGNOSIS — L570 Actinic keratosis: Secondary | ICD-10-CM | POA: Diagnosis not present

## 2014-06-01 DIAGNOSIS — C4441 Basal cell carcinoma of skin of scalp and neck: Secondary | ICD-10-CM | POA: Diagnosis not present

## 2014-06-01 DIAGNOSIS — D649 Anemia, unspecified: Secondary | ICD-10-CM | POA: Diagnosis not present

## 2014-06-15 DIAGNOSIS — C4441 Basal cell carcinoma of skin of scalp and neck: Secondary | ICD-10-CM | POA: Diagnosis not present

## 2014-06-30 ENCOUNTER — Encounter: Payer: Self-pay | Admitting: Family Medicine

## 2014-07-11 ENCOUNTER — Other Ambulatory Visit: Payer: Self-pay | Admitting: Family Medicine

## 2014-07-16 ENCOUNTER — Other Ambulatory Visit: Payer: Self-pay | Admitting: Family Medicine

## 2014-07-27 DIAGNOSIS — C9110 Chronic lymphocytic leukemia of B-cell type not having achieved remission: Secondary | ICD-10-CM | POA: Diagnosis not present

## 2014-08-25 DIAGNOSIS — H43813 Vitreous degeneration, bilateral: Secondary | ICD-10-CM | POA: Diagnosis not present

## 2014-09-09 DIAGNOSIS — Q892 Congenital malformations of other endocrine glands: Secondary | ICD-10-CM | POA: Diagnosis not present

## 2014-09-12 DIAGNOSIS — I129 Hypertensive chronic kidney disease with stage 1 through stage 4 chronic kidney disease, or unspecified chronic kidney disease: Secondary | ICD-10-CM | POA: Diagnosis not present

## 2014-09-12 DIAGNOSIS — Z85828 Personal history of other malignant neoplasm of skin: Secondary | ICD-10-CM | POA: Diagnosis not present

## 2014-09-12 DIAGNOSIS — R220 Localized swelling, mass and lump, head: Secondary | ICD-10-CM | POA: Diagnosis not present

## 2014-09-12 DIAGNOSIS — Z006 Encounter for examination for normal comparison and control in clinical research program: Secondary | ICD-10-CM | POA: Diagnosis not present

## 2014-09-12 DIAGNOSIS — E790 Hyperuricemia without signs of inflammatory arthritis and tophaceous disease: Secondary | ICD-10-CM | POA: Diagnosis not present

## 2014-09-12 DIAGNOSIS — N189 Chronic kidney disease, unspecified: Secondary | ICD-10-CM | POA: Diagnosis not present

## 2014-09-12 DIAGNOSIS — E780 Pure hypercholesterolemia: Secondary | ICD-10-CM | POA: Diagnosis not present

## 2014-09-12 DIAGNOSIS — Z8619 Personal history of other infectious and parasitic diseases: Secondary | ICD-10-CM | POA: Diagnosis not present

## 2014-09-12 DIAGNOSIS — C9110 Chronic lymphocytic leukemia of B-cell type not having achieved remission: Secondary | ICD-10-CM | POA: Diagnosis not present

## 2014-09-12 DIAGNOSIS — D649 Anemia, unspecified: Secondary | ICD-10-CM | POA: Diagnosis not present

## 2014-09-16 ENCOUNTER — Encounter: Payer: Self-pay | Admitting: Family Medicine

## 2014-09-16 ENCOUNTER — Ambulatory Visit (INDEPENDENT_AMBULATORY_CARE_PROVIDER_SITE_OTHER): Payer: Medicare Other | Admitting: Family Medicine

## 2014-09-16 VITALS — BP 122/64 | HR 68 | Temp 97.8°F | Ht 69.0 in | Wt 166.8 lb

## 2014-09-16 DIAGNOSIS — N183 Chronic kidney disease, stage 3 unspecified: Secondary | ICD-10-CM

## 2014-09-16 DIAGNOSIS — I10 Essential (primary) hypertension: Secondary | ICD-10-CM

## 2014-09-16 DIAGNOSIS — Z7189 Other specified counseling: Secondary | ICD-10-CM | POA: Insufficient documentation

## 2014-09-16 DIAGNOSIS — E559 Vitamin D deficiency, unspecified: Secondary | ICD-10-CM

## 2014-09-16 DIAGNOSIS — Z0000 Encounter for general adult medical examination without abnormal findings: Secondary | ICD-10-CM | POA: Diagnosis not present

## 2014-09-16 DIAGNOSIS — Z8673 Personal history of transient ischemic attack (TIA), and cerebral infarction without residual deficits: Secondary | ICD-10-CM

## 2014-09-16 DIAGNOSIS — R7303 Prediabetes: Secondary | ICD-10-CM

## 2014-09-16 DIAGNOSIS — C9110 Chronic lymphocytic leukemia of B-cell type not having achieved remission: Secondary | ICD-10-CM

## 2014-09-16 DIAGNOSIS — R351 Nocturia: Secondary | ICD-10-CM

## 2014-09-16 DIAGNOSIS — I350 Nonrheumatic aortic (valve) stenosis: Secondary | ICD-10-CM

## 2014-09-16 DIAGNOSIS — E785 Hyperlipidemia, unspecified: Secondary | ICD-10-CM

## 2014-09-16 DIAGNOSIS — I6523 Occlusion and stenosis of bilateral carotid arteries: Secondary | ICD-10-CM

## 2014-09-16 LAB — POCT URINALYSIS DIPSTICK
Bilirubin, UA: NEGATIVE
Blood, UA: NEGATIVE
Glucose, UA: NEGATIVE
Ketones, UA: NEGATIVE
Leukocytes, UA: NEGATIVE
Nitrite, UA: NEGATIVE
Protein, UA: NEGATIVE
Spec Grav, UA: 1.015
Urobilinogen, UA: 0.2
pH, UA: 6.5

## 2014-09-16 NOTE — Assessment & Plan Note (Addendum)
Followed by yearly [place].

## 2014-09-16 NOTE — Assessment & Plan Note (Addendum)
Chronic, stable. Endorses some low bp readings and sxs esp when he works out in am. Will change lisinopril to 10mg  BID. Pt declines med refill - states he has enough supply at home.

## 2014-09-16 NOTE — Assessment & Plan Note (Signed)
Actually states last check stage 0 - after latest clinica trial.

## 2014-09-16 NOTE — Assessment & Plan Note (Signed)

## 2014-09-16 NOTE — Progress Notes (Signed)
BP 122/64 mmHg  Pulse 68  Temp(Src) 97.8 F (36.6 C) (Oral)  Ht 5\' 9"  (1.753 m)  Wt 166 lb 12.8 oz (75.66 kg)  BMI 24.62 kg/m2  SpO2 98%   CC: medicare wellness visit  Subjective:    [name], male    DOB: 1928/10/12, 81 y.o.   MRN: [id number]  HPI: Jimmy [name] is a 81 y.o. male presenting on 09/16/2014 for Annual Exam   CLL - sees [facility] Dr Lissa Merlin. Doing remarkably well. Off all meds.   Nocturia x 3-4. No troubles during the day. No h/o enlarged prostate. No dysuria. Declines DRE today.  Vision screen passed Hearing - wears hearing aides.  Denies falls, depression, anhedonia or sadness.   Preventative: Last colonoscopy 2006 - aged out. Prostate exam - aged out. Flu shot 12/2012 Pneumovax 1999. prevnar 2015 Td 2007  zostavax 2008.  Advanced directives: would like wife to be HCPOA. Will bring me copy. Has at home. Seat belt use discussed Sunscreen use discussed. No changing moles on skin  Married and lives with wife  1 son died of lung cancer at 42 years old  1 daughter bipolar  Activity: walks 1 mi daily on treadmill  Diet: good water, some fruits/vegetables   Relevant past medical, surgical, family and social history reviewed and updated as indicated. Interim medical history since our last visit reviewed. Allergies and medications reviewed and updated. Current Outpatient Prescriptions on File Prior to Visit  Medication Sig  . allopurinol (ZYLOPRIM) 100 MG tablet Take 100 mg by mouth daily.  [name] aspirin EC 81 MG tablet Take 1 tablet (81 mg total) by mouth daily.  . nitroGLYCERIN (NITROSTAT) 0.4 MG SL tablet Place 1 tablet (0.4 mg total) under the tongue every 5 (five) minutes as needed for chest pain (max 3 doses in 15 minutes).  . simvastatin (ZOCOR) 20 MG tablet Take one tablet at bedtime **WILL NEED PHYSICAL FOR FURTHER REFILLS**  . valACYclovir (VALTREX) 500 MG tablet TAKE 1 TABLET DAILY   No current facility-administered  medications on file prior to visit.    Review of Systems Per HPI unless specifically indicated above     Objective:    BP 122/64 mmHg  Pulse 68  Temp(Src) 97.8 F (36.6 C) (Oral)  Ht 5\' 9"  (1.753 m)  Wt 166 lb 12.8 oz (75.66 kg)  BMI 24.62 kg/m2  SpO2 98%  Wt Readings from Last 3 Encounters:  09/16/14 166 lb 12.8 oz (75.66 kg)  05/18/14 165 lb 12 oz (75.184 kg)  04/18/14 163 lb 4 oz (74.05 kg)    Physical Exam  Constitutional: He is oriented to person, place, and time. He appears well-developed and well-nourished. No distress.  HENT:  Head: Normocephalic and atraumatic.  Right Ear: Hearing, tympanic membrane, external ear and ear canal normal.  Left Ear: Hearing, tympanic membrane, external ear and ear canal normal.  Nose: Nose normal.  Mouth/Throat: Uvula is midline, oropharynx is clear and moist and mucous membranes are normal. No oropharyngeal exudate, posterior oropharyngeal edema or posterior oropharyngeal erythema.  Eyes: Conjunctivae and EOM are normal. Pupils are equal, round, and reactive to light. No scleral icterus.  Neck: Normal range of motion. Neck supple. Carotid bruit is present (R carotid bruit). No thyromegaly present.  Cardiovascular: Normal rate, regular rhythm and intact distal pulses.   Murmur (2/6 sem best at [employer]) heard. Pulses:      Radial pulses are 2+ on the right side, and 2+ on the left  side.  Pulmonary/Chest: Effort normal and breath sounds normal. No respiratory distress. He has no wheezes. He has no rales.  Abdominal: Soft. Bowel sounds are normal. He exhibits no distension and no mass. There is no tenderness. There is no rebound and no guarding.  Musculoskeletal: Normal range of motion. He exhibits no edema.  Lymphadenopathy:    He has no cervical adenopathy.  Neurological: He is alert and oriented to person, place, and time.  CN grossly intact, station and gait intact Recall 2/3, 3/3 with cue Calculation 5/5 serial 3s  Skin: Skin is warm  and dry. No rash noted.  Psychiatric: He has a normal mood and affect. His behavior is normal. Judgment and thought content normal.  Nursing note and vitals reviewed.     Assessment & Plan:   Problem List Items Addressed This Visit      Chronic   Chronic lymphocytic leukemia, Rai stage IV    Actually states last check stage 0 - after latest clinica trial.       Relevant Orders   CBC with Differential/Platelet     Other   Advanced care planning/counseling discussion    Advanced directives: would like wife to be HCPOA. Will bring me copy. Has at home.      Aortic stenosis    Chronic, stable.       Relevant Medications   lisinopril (PRINIVIL,ZESTRIL) 20 MG tablet   Carotid artery stenosis    Followed by yearly [place].       Relevant Medications   lisinopril (PRINIVIL,ZESTRIL) 20 MG tablet   Chronic kidney disease, stage 3    Check when returns fasting on Monday      Relevant Orders   Renal function panel   Essential hypertension    Chronic, stable. Endorses some low bp readings and sxs esp when he works out in am. Will change lisinopril to 10mg  BID. Pt declines med refill - states he has enough supply at home.      Relevant Medications   lisinopril (PRINIVIL,ZESTRIL) 20 MG tablet   History of CVA (cerebrovascular accident)    Continue baby aspirin daily.      Hyperlipidemia    Check FLP when returns fasting.      Relevant Medications   lisinopril (PRINIVIL,ZESTRIL) 20 MG tablet   Other Relevant Orders   Lipid panel   Medicare annual wellness visit, subsequent - Primary    I have personally reviewed the Medicare Annual Wellness questionnaire and have noted 1. The patient's medical and social history 2. Their use of alcohol, tobacco or illicit drugs 3. Their current medications and supplements 4. The patient's functional ability including ADL's, fall risks, home safety risks and hearing or visual impairment. 5. Diet and physical activity 6. Evidence for  depression or mood disorders The patients weight, height, BMI have been recorded in the chart.  Hearing and vision has been addressed. I have made referrals, counseling and provided education to the patient based review of the above and I have provided the pt with a written personalized care plan for preventive services. Provider list updated - see scanned questionairre. Reviewed preventative protocols and updated unless pt declined.      Nocturia    Discussed likely BPH related. Declines DRE today. Declines medication. Lab Results  Component Value Date   PSA 0.42 05/30/2010   PSA 0.58 12/28/2009   PSA 0.48 12/09/2008  consider PSA next labwork.  Check UA today to eval for UTI/glucosuria.      Relevant  Orders   Urinalysis Dipstick (Completed)   Prediabetes   Relevant Orders   Hemoglobin A1c   Vitamin D deficiency    Consider checking at upcoming labs.      Relevant Orders   Vit D  25 hydroxy (rtn osteoporosis monitoring)       Follow up plan: Return in about 1 year (around 09/16/2015), or as needed, for medicare wellness visit.

## 2014-09-16 NOTE — Assessment & Plan Note (Signed)
Chronic, stable 

## 2014-09-16 NOTE — Assessment & Plan Note (Signed)
Advanced directives: would like wife to be HCPOA. Will bring me copy. Has at home.

## 2014-09-16 NOTE — Assessment & Plan Note (Signed)
Check FLP when returns fasting. 

## 2014-09-16 NOTE — Patient Instructions (Addendum)
Return Monday for blood work. Bring me copy of advanced directive.  Urinalysis today for nocturia. Take 1/2 lisinopril (10mg ) twice daily. Return as needed or in 1 year for next medicare wellness visit

## 2014-09-16 NOTE — Progress Notes (Signed)
Pre visit review using our clinic review tool, if applicable. No additional management support is needed unless otherwise documented below in the visit note. 

## 2014-09-16 NOTE — Assessment & Plan Note (Signed)
Check when returns fasting on Monday

## 2014-09-16 NOTE — Assessment & Plan Note (Signed)
Continue baby aspirin daily.

## 2014-09-16 NOTE — Assessment & Plan Note (Signed)
Consider checking at upcoming labs.

## 2014-09-16 NOTE — Assessment & Plan Note (Addendum)
Discussed likely BPH related. Declines DRE today. Declines medication. Lab Results  Component Value Date   PSA 0.42 05/30/2010   PSA 0.58 12/28/2009   PSA 0.48 12/09/2008  consider PSA next labwork.  Check UA today to eval for UTI/glucosuria.

## 2014-09-19 ENCOUNTER — Other Ambulatory Visit (INDEPENDENT_AMBULATORY_CARE_PROVIDER_SITE_OTHER): Payer: Medicare Other

## 2014-09-19 DIAGNOSIS — C9110 Chronic lymphocytic leukemia of B-cell type not having achieved remission: Secondary | ICD-10-CM | POA: Diagnosis not present

## 2014-09-19 DIAGNOSIS — N183 Chronic kidney disease, stage 3 unspecified: Secondary | ICD-10-CM

## 2014-09-19 DIAGNOSIS — R7309 Other abnormal glucose: Secondary | ICD-10-CM

## 2014-09-19 DIAGNOSIS — E785 Hyperlipidemia, unspecified: Secondary | ICD-10-CM | POA: Diagnosis not present

## 2014-09-19 DIAGNOSIS — E559 Vitamin D deficiency, unspecified: Secondary | ICD-10-CM | POA: Diagnosis not present

## 2014-09-19 DIAGNOSIS — R7303 Prediabetes: Secondary | ICD-10-CM

## 2014-09-19 LAB — RENAL FUNCTION PANEL
Albumin: 4.3 g/dL (ref 3.5–5.2)
BUN: 15 mg/dL (ref 6–23)
CO2: 28 mEq/L (ref 19–32)
Calcium: 9.9 mg/dL (ref 8.4–10.5)
Chloride: 102 mEq/L (ref 96–112)
Creatinine, Ser: 1.31 mg/dL (ref 0.40–1.50)
GFR: 55.47 mL/min — ABNORMAL LOW (ref >60.00)
Glucose, Bld: 112 mg/dL — ABNORMAL HIGH (ref 70–99)
Phosphorus: 2.3 mg/dL (ref 2.3–4.6)
Potassium: 4.4 mEq/L (ref 3.5–5.1)
Sodium: 136 mEq/L (ref 135–145)

## 2014-09-19 LAB — LIPID PANEL
Cholesterol: 93 mg/dL (ref 0–200)
HDL: 29.7 mg/dL — ABNORMAL LOW (ref >39.00)
LDL Cholesterol: 36 mg/dL (ref 0–99)
NonHDL: 63.3
Total CHOL/HDL Ratio: 3
Triglycerides: 136 mg/dL (ref 0.0–149.0)
VLDL: 27.2 mg/dL (ref 0.0–40.0)

## 2014-09-19 LAB — CBC WITH DIFFERENTIAL/PLATELET
Basophils Absolute: 0 10*3/uL (ref 0.0–0.1)
Basophils Relative: 0.3 % (ref 0.0–3.0)
Eosinophils Absolute: 0.1 10*3/uL (ref 0.0–0.7)
Eosinophils Relative: 3.8 % (ref 0.0–5.0)
HCT: 40.8 % (ref 39.0–52.0)
Hemoglobin: 14 g/dL (ref 13.0–17.0)
Lymphocytes Relative: 15 % (ref 12.0–46.0)
Lymphs Abs: 0.5 10*3/uL — ABNORMAL LOW (ref 0.7–4.0)
MCHC: 34.2 g/dL (ref 30.0–36.0)
MCV: 88.3 fl (ref 78.0–100.0)
Monocytes Absolute: 0.5 10*3/uL (ref 0.1–1.0)
Monocytes Relative: 14.8 % — ABNORMAL HIGH (ref 3.0–12.0)
Neutro Abs: 2.3 10*3/uL (ref 1.4–7.7)
Neutrophils Relative %: 66.1 % (ref 43.0–77.0)
Platelets: 119 10*3/uL — ABNORMAL LOW (ref 150.0–400.0)
RBC: 4.63 Mil/uL (ref 4.22–5.81)
RDW: 14.3 % (ref 11.5–15.5)
WBC: 3.5 10*3/uL — ABNORMAL LOW (ref 4.0–10.5)

## 2014-09-19 LAB — HEMOGLOBIN A1C: Hgb A1c MFr Bld: 5.6 % (ref 4.6–6.5)

## 2014-09-19 LAB — VITAMIN D 25 HYDROXY (VIT D DEFICIENCY, FRACTURES): VITD: 20.62 ng/mL — ABNORMAL LOW (ref 30.00–100.00)

## 2014-09-22 ENCOUNTER — Encounter: Payer: Self-pay | Admitting: *Deleted

## 2014-09-22 ENCOUNTER — Other Ambulatory Visit: Payer: Self-pay | Admitting: Family Medicine

## 2014-09-22 MED ORDER — VITAMIN D3 25 MCG (1000 UT) PO CAPS: 1.0000 | ORAL_CAPSULE | Freq: Every day | ORAL | Status: DC

## 2014-10-04 ENCOUNTER — Encounter: Payer: Self-pay | Admitting: Family Medicine

## 2014-10-05 DIAGNOSIS — Z85828 Personal history of other malignant neoplasm of skin: Secondary | ICD-10-CM | POA: Diagnosis not present

## 2014-10-05 DIAGNOSIS — C4492 Squamous cell carcinoma of skin, unspecified: Secondary | ICD-10-CM

## 2014-10-05 DIAGNOSIS — L578 Other skin changes due to chronic exposure to nonionizing radiation: Secondary | ICD-10-CM | POA: Diagnosis not present

## 2014-10-05 DIAGNOSIS — C44222 Squamous cell carcinoma of skin of right ear and external auricular canal: Secondary | ICD-10-CM | POA: Diagnosis not present

## 2014-10-05 DIAGNOSIS — D485 Neoplasm of uncertain behavior of skin: Secondary | ICD-10-CM | POA: Diagnosis not present

## 2014-10-05 DIAGNOSIS — L570 Actinic keratosis: Secondary | ICD-10-CM | POA: Diagnosis not present

## 2014-10-05 HISTORY — DX: Squamous cell carcinoma of skin, unspecified: C44.92

## 2014-10-07 ENCOUNTER — Other Ambulatory Visit: Payer: Self-pay | Admitting: Family Medicine

## 2014-10-14 ENCOUNTER — Other Ambulatory Visit: Payer: Self-pay | Admitting: Family Medicine

## 2014-11-08 ENCOUNTER — Other Ambulatory Visit: Payer: Self-pay | Admitting: Cardiovascular Disease

## 2014-11-08 DIAGNOSIS — I6523 Occlusion and stenosis of bilateral carotid arteries: Secondary | ICD-10-CM

## 2014-11-11 ENCOUNTER — Ambulatory Visit (INDEPENDENT_AMBULATORY_CARE_PROVIDER_SITE_OTHER): Payer: Medicare Other

## 2014-11-11 ENCOUNTER — Ambulatory Visit (INDEPENDENT_AMBULATORY_CARE_PROVIDER_SITE_OTHER): Payer: Medicare Other | Admitting: Cardiovascular Disease

## 2014-11-11 ENCOUNTER — Encounter: Payer: Self-pay | Admitting: Cardiovascular Disease

## 2014-11-11 VITALS — BP 108/60 | HR 62 | Ht 69.5 in | Wt 169.0 lb

## 2014-11-11 DIAGNOSIS — R531 Weakness: Secondary | ICD-10-CM | POA: Insufficient documentation

## 2014-11-11 DIAGNOSIS — E785 Hyperlipidemia, unspecified: Secondary | ICD-10-CM

## 2014-11-11 DIAGNOSIS — I10 Essential (primary) hypertension: Secondary | ICD-10-CM | POA: Diagnosis not present

## 2014-11-11 DIAGNOSIS — R29898 Other symptoms and signs involving the musculoskeletal system: Secondary | ICD-10-CM

## 2014-11-11 DIAGNOSIS — Z951 Presence of aortocoronary bypass graft: Secondary | ICD-10-CM | POA: Diagnosis not present

## 2014-11-11 DIAGNOSIS — I6523 Occlusion and stenosis of bilateral carotid arteries: Secondary | ICD-10-CM

## 2014-11-11 DIAGNOSIS — I350 Nonrheumatic aortic (valve) stenosis: Secondary | ICD-10-CM

## 2014-11-11 MED ORDER — LISINOPRIL 10 MG PO TABS: 10.0000 mg | ORAL_TABLET | Freq: Every day | ORAL | Status: DC

## 2014-11-11 NOTE — Assessment & Plan Note (Signed)
Etiology of his leg weakness symptoms is unclear. He feels it is from low blood pressure. If symptoms do not improve, recommended ABIs

## 2014-11-11 NOTE — Progress Notes (Signed)
HPI Comments: Jimmy [name] is a very pleasant 81 year-old male with a history of CLL (WBC ~ 50k), coronary artery disease, status post bypass surgery in 2003. Myoview in September 2008, which showed ejection fraction of 63% with no ischemia or infarct.  history of hyperlipidemia, hypertension,  carotid stensosis and minimal bilateral renal artery stenosis.  He has had 6 rounds of treatment of CLL, finished in December. Follow-up with hematology March 2016 He presents for followup of his coronary artery disease Negative stress test August 2015  In follow-up today, he reports that he is doing well. Denies any anginal symptoms. He does have some leg weakness starting this week. He feels this is from low blood pressure. He previously decrease lisinopril from 20 down to 10 mg daily. He ran out of the lower dose and restarted the 20 mg pills. Since then has felt weak. Denies any claudication type symptoms.  He has finished the clinical trial at [facility] for his CLL. Lab work as significantly improved, her blood cell count now on a low normal range  Total cholesterol less than 100  EKG on today's visit shows normal sinus rhythm with rate 62 bpm, no significant ST or T-wave changes. Consider old inferior MI, poor R-wave progression  Other past medical history  completed 6 rounds of treatment, stopped in December 2015. He has follow-up with hematology oncology in March 2016  Recent lab work showing total cholesterol of 80 Carotid ultrasound with right greater than left disease, stable 60% on the right, 40% on the left EKG on today's visit shows normal sinus rhythm with rate 68 bpm, no significant ST or T-wave changes  Previous echocardiogram showing normal ejection fraction, mild aortic valve stenosis Carotid u/s 5916: RICA 38-46% LICA 40 to 65%.    Allergies  Allergen Reactions  . New Skin   . Tape Dermatitis    Outpatient Encounter Prescriptions as of 11/11/2014  Medication Sig  .  allopurinol (ZYLOPRIM) 300 MG tablet Take 300 mg by mouth daily.   [name] aspirin EC 81 MG tablet Take 1 tablet (81 mg total) by mouth daily.  . Cholecalciferol (VITAMIN D3) 1000 UNITS CAPS Take 1 capsule (1,000 Units total) by mouth daily.  [name] lisinopril (PRINIVIL,ZESTRIL) 10 MG tablet Take 1 tablet (10 mg total) by mouth daily.  . nitroGLYCERIN (NITROSTAT) 0.4 MG SL tablet Place 1 tablet (0.4 mg total) under the tongue every 5 (five) minutes as needed for chest pain (max 3 doses in 15 minutes).  . simvastatin (ZOCOR) 20 MG tablet Take 1 tablet (20 mg total) by mouth at bedtime.  . valACYclovir (VALTREX) 500 MG tablet TAKE 1 TABLET DAILY  . [DISCONTINUED] lisinopril (PRINIVIL,ZESTRIL) 10 MG tablet Take 10 mg by mouth daily.  . [DISCONTINUED] allopurinol (ZYLOPRIM) 100 MG tablet Take 100 mg by mouth daily.  . [DISCONTINUED] lisinopril (PRINIVIL,ZESTRIL) 20 MG tablet Take 10 mg by mouth daily.   . [DISCONTINUED] lisinopril (PRINIVIL,ZESTRIL) 20 MG tablet TAKE 1 TABLET DAILY (Patient not taking: Reported on 11/11/2014)   No facility-administered encounter medications on file as of 11/11/2014.    Past Medical History  Diagnosis Date  . CAD (coronary artery disease)     a. 04/2001 S/P CABGx 4;  b. 2008 MV:  EF 63% normal perfusion.  [name] GERD (gastroesophageal reflux disease) 1990s  . Hyperlipemia 2002  . Hypertension 2002  . Thrombocytopenia     outpatient goals Hgb >9, plt >20  . History of herpes genitalis     valtrex daily  .  Carotid stenosis     a. 11/2012 Carotid U/S:  RICA 78-29%, LICA 56-21%.  . Diastolic CHF     a. 05/863 Echo: EF 55-65%, mild conc LVH, Gr 1 DD, mild AS, triv AI, mildly dil Ao root (3.5 cm).  . Mild aortic stenosis     a. 07/2011 Echo: Mild AS, triv AI.  [name] Chronic kidney disease, stage 3   . Mass of submandibular region 2015    referred to gen surg after chemo  . History of CVA (cerebrovascular accident) 2015    by MRI - remote L internal capsule  . CLL (chronic  lymphoblastic leukemia) 2009    Stage IV; Jimmy [name] - referred to Jimmy [name] [facility] 07/2013 now on Rituxan (10/2013) --> stage 0 (09/2014)    Past Surgical History  Procedure Laterality Date  . Hepatic artery angioplasty  [address] [place]  . Hemorroidectomy  1954    Fissure repair, Jimmy [name]  . Kidney stone surgery  1977    Dr Redmond Baseman  . Lithotripsy  1990s    Multiple  . Angioplasty  1998  . Coronary angioplasty  1998  . Coronary artery bypass graft  04/23/2001    x4, Dr. Pia Mau  . Esophagogastroduodenoscopy  11/29/1987    Gastritis  . Colonoscopy  11/29/1987    Normal  . Colonoscopy  02/07/2003    Hemm. Internal, focal proctitis, negative biopsy  . Colonoscopy  12/12/2004    Internal external hemorrhoids, +proctits, negative biopsy  . Cataract extraction, bilateral      R 1/09, L 8/09  . Ett  12/10/2006    Persantine myoview nml  . US echocardiography  07/2011    nl sys fxn, EF 55-60%, grade 1 diast dysfunction, mild AS, mildly elevated PA pressure    Social History  reports that he quit smoking about 35 years ago. He has never used smokeless tobacco. He reports that he drinks alcohol. He reports that he does not use illicit drugs.  Family History family history includes Alcohol abuse in his brother; Depression in his daughter; Diabetes in his brother and other; Heart attack in his brother; Heart disease in his mother; Hyperlipidemia in his father and mother; Hypertension in his father and mother; Kidney cancer in his sister; Stroke in his brother.   Review of Systems  Constitutional: Positive for fatigue.  Respiratory: Negative.   Cardiovascular: Negative.   Gastrointestinal: Negative.   Musculoskeletal: Negative.        Leg weakness  Skin: Negative.   Neurological: Negative.   Hematological: Negative.   Psychiatric/Behavioral: Negative.   All other systems reviewed and are negative.   BP 108/60 mmHg  Pulse 62  Ht 5' 9.5" (1.765 m)  Wt 169 lb (76.658 kg)   BMI 24.61 kg/m2  Physical Exam  Constitutional: He is oriented to person, place, and time. He appears well-developed and well-nourished.  HENT:  Head: Normocephalic.  Nose: Nose normal.  Mouth/Throat: Oropharynx is clear and moist.  Eyes: Conjunctivae are normal. Pupils are equal, round, and reactive to light.  Neck: Normal range of motion. Neck supple. No JVD present.  Cardiovascular: Normal rate, regular rhythm, S1 normal, S2 normal, normal heart sounds and intact distal pulses.  Exam reveals no gallop and no friction rub.   No murmur heard. Pulmonary/Chest: Effort normal and breath sounds normal. No respiratory distress. He has no wheezes. He has no rales. He exhibits no tenderness.  Abdominal: Soft. Bowel sounds are normal. He exhibits no  distension. There is no tenderness.  Musculoskeletal: Normal range of motion. He exhibits no edema or tenderness.  Lymphadenopathy:    He has no cervical adenopathy.  Neurological: He is alert and oriented to person, place, and time. Coordination normal.  Skin: Skin is warm and dry. No rash noted. No erythema.  Psychiatric: He has a normal mood and affect. His behavior is normal. Judgment and thought content normal.      Assessment and Plan   Nursing note and vitals reviewed.

## 2014-11-11 NOTE — Assessment & Plan Note (Signed)
Carotid ultrasound from today reviewed with him showing stable disease , 60-79% disease on the right, 40 to 59% disease on the left

## 2014-11-11 NOTE — Assessment & Plan Note (Signed)
Currently with no symptoms of angina. No further workup at this time. Continue current medication regimen. 

## 2014-11-11 NOTE — Assessment & Plan Note (Signed)
New prescription provided for lisinopril 10 mg daily at his request  Recommended he monitor his blood pressure.  Unclear if this is causing his leg weakness

## 2014-11-11 NOTE — Assessment & Plan Note (Signed)
Cholesterol is at goal on the current lipid regimen. No changes to the medications were made.  

## 2014-11-11 NOTE — Patient Instructions (Addendum)
You are doing well. No medication changes were made.  Stay on the lisinopril 10 mg daily  Please call us if you have new issues that need to be addressed before your next appt.  Your physician wants you to follow-up in: 6 months.  You will receive a reminder letter in the mail two months in advance. If you don't receive a letter, please call our office to schedule the follow-up appointment.

## 2014-11-15 DIAGNOSIS — C44222 Squamous cell carcinoma of skin of right ear and external auricular canal: Secondary | ICD-10-CM | POA: Diagnosis not present

## 2014-12-15 ENCOUNTER — Other Ambulatory Visit: Payer: Self-pay | Admitting: *Deleted

## 2014-12-15 MED ORDER — VALACYCLOVIR HCL 500 MG PO TABS: ORAL_TABLET | ORAL | Status: DC

## 2014-12-19 DIAGNOSIS — I129 Hypertensive chronic kidney disease with stage 1 through stage 4 chronic kidney disease, or unspecified chronic kidney disease: Secondary | ICD-10-CM | POA: Diagnosis not present

## 2014-12-19 DIAGNOSIS — C9110 Chronic lymphocytic leukemia of B-cell type not having achieved remission: Secondary | ICD-10-CM | POA: Diagnosis not present

## 2014-12-19 DIAGNOSIS — K5900 Constipation, unspecified: Secondary | ICD-10-CM | POA: Diagnosis not present

## 2014-12-19 DIAGNOSIS — Z006 Encounter for examination for normal comparison and control in clinical research program: Secondary | ICD-10-CM | POA: Diagnosis not present

## 2014-12-19 DIAGNOSIS — Z85828 Personal history of other malignant neoplasm of skin: Secondary | ICD-10-CM | POA: Diagnosis not present

## 2014-12-19 DIAGNOSIS — E780 Pure hypercholesterolemia: Secondary | ICD-10-CM | POA: Diagnosis not present

## 2014-12-19 DIAGNOSIS — Z951 Presence of aortocoronary bypass graft: Secondary | ICD-10-CM | POA: Diagnosis not present

## 2014-12-19 DIAGNOSIS — R220 Localized swelling, mass and lump, head: Secondary | ICD-10-CM | POA: Diagnosis not present

## 2014-12-19 DIAGNOSIS — N189 Chronic kidney disease, unspecified: Secondary | ICD-10-CM | POA: Diagnosis not present

## 2014-12-19 DIAGNOSIS — I6782 Cerebral ischemia: Secondary | ICD-10-CM | POA: Diagnosis not present

## 2014-12-19 DIAGNOSIS — D649 Anemia, unspecified: Secondary | ICD-10-CM | POA: Diagnosis not present

## 2014-12-19 DIAGNOSIS — E790 Hyperuricemia without signs of inflammatory arthritis and tophaceous disease: Secondary | ICD-10-CM | POA: Diagnosis not present

## 2015-01-02 ENCOUNTER — Ambulatory Visit (INDEPENDENT_AMBULATORY_CARE_PROVIDER_SITE_OTHER): Payer: Medicare Other

## 2015-01-02 DIAGNOSIS — Z23 Encounter for immunization: Secondary | ICD-10-CM

## 2015-03-20 DIAGNOSIS — Z006 Encounter for examination for normal comparison and control in clinical research program: Secondary | ICD-10-CM | POA: Diagnosis not present

## 2015-03-20 DIAGNOSIS — C9110 Chronic lymphocytic leukemia of B-cell type not having achieved remission: Secondary | ICD-10-CM | POA: Diagnosis not present

## 2015-03-20 DIAGNOSIS — I129 Hypertensive chronic kidney disease with stage 1 through stage 4 chronic kidney disease, or unspecified chronic kidney disease: Secondary | ICD-10-CM | POA: Diagnosis not present

## 2015-03-20 DIAGNOSIS — I10 Essential (primary) hypertension: Secondary | ICD-10-CM | POA: Diagnosis not present

## 2015-04-12 DIAGNOSIS — L812 Freckles: Secondary | ICD-10-CM | POA: Diagnosis not present

## 2015-04-12 DIAGNOSIS — D1800 Hemangioma unspecified site: Secondary | ICD-10-CM | POA: Diagnosis not present

## 2015-04-12 DIAGNOSIS — Z85828 Personal history of other malignant neoplasm of skin: Secondary | ICD-10-CM | POA: Diagnosis not present

## 2015-04-12 DIAGNOSIS — L570 Actinic keratosis: Secondary | ICD-10-CM | POA: Diagnosis not present

## 2015-04-12 DIAGNOSIS — L578 Other skin changes due to chronic exposure to nonionizing radiation: Secondary | ICD-10-CM | POA: Diagnosis not present

## 2015-04-12 DIAGNOSIS — Z1283 Encounter for screening for malignant neoplasm of skin: Secondary | ICD-10-CM | POA: Diagnosis not present

## 2015-04-12 DIAGNOSIS — L821 Other seborrheic keratosis: Secondary | ICD-10-CM | POA: Diagnosis not present

## 2015-04-12 DIAGNOSIS — D229 Melanocytic nevi, unspecified: Secondary | ICD-10-CM | POA: Diagnosis not present

## 2015-04-12 DIAGNOSIS — L820 Inflamed seborrheic keratosis: Secondary | ICD-10-CM | POA: Diagnosis not present

## 2015-05-15 ENCOUNTER — Ambulatory Visit: Payer: Medicare Other | Admitting: Cardiovascular Disease

## 2015-06-23 ENCOUNTER — Other Ambulatory Visit: Payer: Self-pay

## 2015-06-23 ENCOUNTER — Ambulatory Visit (INDEPENDENT_AMBULATORY_CARE_PROVIDER_SITE_OTHER): Payer: Medicare Other | Admitting: Cardiovascular Disease

## 2015-06-23 ENCOUNTER — Encounter: Payer: Self-pay | Admitting: Cardiovascular Disease

## 2015-06-23 VITALS — BP 108/62 | HR 72 | Ht 69.0 in | Wt 177.0 lb

## 2015-06-23 DIAGNOSIS — I350 Nonrheumatic aortic (valve) stenosis: Secondary | ICD-10-CM | POA: Diagnosis not present

## 2015-06-23 DIAGNOSIS — I6523 Occlusion and stenosis of bilateral carotid arteries: Secondary | ICD-10-CM

## 2015-06-23 DIAGNOSIS — I10 Essential (primary) hypertension: Secondary | ICD-10-CM | POA: Diagnosis not present

## 2015-06-23 DIAGNOSIS — Z951 Presence of aortocoronary bypass graft: Secondary | ICD-10-CM

## 2015-06-23 NOTE — Assessment & Plan Note (Signed)
60-79% disease on the right He prefers to have ultrasound annually

## 2015-06-23 NOTE — Telephone Encounter (Signed)
Pt left v/m requesting [facility]; pt last seen annual 09/16/14; do not see where Dr Darnell Level has prescribed the allopurinol.Please advise.

## 2015-06-23 NOTE — Assessment & Plan Note (Signed)
Currently with no symptoms of angina. No further workup at this time. Continue current medication regimen. 

## 2015-06-23 NOTE — Assessment & Plan Note (Signed)
No significant murmur appreciated on exam

## 2015-06-23 NOTE — Assessment & Plan Note (Signed)
Blood pressure is well controlled on today's visit. No changes made to the medications. 

## 2015-06-23 NOTE — Patient Instructions (Signed)
You are doing well. No medication changes were made.  Please call us if you have new issues that need to be addressed before your next appt.  Your physician wants you to follow-up in: 6 months.  You will receive a reminder letter in the mail two months in advance. If you don't receive a letter, please call our office to schedule the follow-up appointment.   

## 2015-06-23 NOTE — Progress Notes (Signed)
HPI Comments: Jimmy [name] is a very pleasant 82 year-old male with a history of CLL (WBC ~ 50k), coronary artery disease, status post bypass surgery in 2003. Myoview in September 2008, which showed ejection fraction of 63% with no ischemia or infarct.  history of hyperlipidemia, hypertension,  carotid stensosis and minimal bilateral renal artery stenosis.  He has had 6 rounds of treatment of CLL, finished in December. Follow-up with hematology March 2016 He presents for followup of his coronary artery disease Negative stress test August 2015  In follow-up today, he denies any anginal symptoms He has started to exercise with recumbent bike at home Goes to the gym early in the morning 7 days per week, does aerobic and light weights tolerating lisinopril 10 mg daily Denies any claudication type symptoms.  60-79% carotid stenosis on the right, has been stable  for several years  He has finished the clinical trial at [facility] for his CLL. Lab work as significantly improved, her blood cell count now on a low normal range  Total cholesterol less than 100  EKG on today's visit shows normal sinus rhythm with rate 75 bpm, no significant ST or T-wave changes.   Other past medical history  completed 6 rounds of treatment, stopped in December 2015. He has follow-up with hematology oncology in March 2016  Recent lab work showing total cholesterol of 80 Carotid ultrasound with right greater than left disease, stable 60% on the right, 40% on the left EKG on today's visit shows normal sinus rhythm with rate 68 bpm, no significant ST or T-wave changes  Previous echocardiogram showing normal ejection fraction, mild aortic valve stenosis Carotid u/s 123456: RICA [id number] LICA 40 to [id number].    Allergies  Allergen Reactions  . New Skin   . Tape Dermatitis    Outpatient Encounter Prescriptions as of 06/23/2015  Medication Sig  . allopurinol (ZYLOPRIM) 300 MG tablet Take 300 mg by mouth daily.   [name]  aspirin EC 81 MG tablet Take 1 tablet (81 mg total) by mouth daily.  . Cholecalciferol (VITAMIN D3) 1000 UNITS CAPS Take 1 capsule (1,000 Units total) by mouth daily.  [name] lisinopril (PRINIVIL,ZESTRIL) 10 MG tablet Take 1 tablet (10 mg total) by mouth daily.  . nitroGLYCERIN (NITROSTAT) 0.4 MG SL tablet Place 1 tablet (0.4 mg total) under the tongue every 5 (five) minutes as needed for chest pain (max 3 doses in 15 minutes).  . simvastatin (ZOCOR) 20 MG tablet Take 1 tablet (20 mg total) by mouth at bedtime.  . valACYclovir (VALTREX) 500 MG tablet TAKE 1 TABLET DAILY   No facility-administered encounter medications on file as of 06/23/2015.    Past Medical History  Diagnosis Date  . CAD (coronary artery disease)     a. 04/2001 S/P CABGx 4;  b. 2008 MV:  EF 63% normal perfusion.  [name] GERD (gastroesophageal reflux disease) 1990s  . Hyperlipemia 2002  . Hypertension 2002  . Thrombocytopenia ([place])     outpatient goals Hgb >9, plt >20  . History of herpes genitalis     valtrex daily  . Carotid stenosis     a. 11/2012 Carotid U/S:  RICA [id number], LICA 123456.  . Diastolic CHF ([place])     a. 07/2011 Echo: EF 55-65%, mild conc LVH, Gr 1 DD, mild AS, triv AI, mildly dil Ao root (3.5 cm).  . Mild aortic stenosis     a. 07/2011 Echo: Mild AS, triv AI.  [name] Chronic kidney disease, stage 3   .  Mass of submandibular region 2015    referred to gen surg after chemo  . History of CVA (cerebrovascular accident) 2015    by MRI - remote L internal capsule  . CLL (chronic lymphoblastic leukemia) 2009    Stage IV; Dr. Ivor Messier - referred to Dr. Lissa Merlin [facility] 07/2013 now on Rituxan (10/2013) --> stage 0 (09/2014)    Past Surgical History  Procedure Laterality Date  . Hepatic artery angioplasty  [address] [place]  . Hemorroidectomy  1954    Fissure repair, [place]  . Kidney stone surgery  1977    Dr Redmond Baseman  . Lithotripsy  1990s    Multiple  . Angioplasty  1998  . Coronary angioplasty  1998  . Coronary artery  bypass graft  04/23/2001    x4, Dr. Pia Mau  . Esophagogastroduodenoscopy  11/29/1987    Gastritis  . Colonoscopy  11/29/1987    Normal  . Colonoscopy  02/07/2003    Hemm. Internal, focal proctitis, negative biopsy  . Colonoscopy  12/12/2004    Internal external hemorrhoids, +proctits, negative biopsy  . Cataract extraction, bilateral      R 1/09, L 8/09  . Ett  12/10/2006    Persantine myoview nml  . US echocardiography  07/2011    nl sys fxn, EF 55-60%, grade 1 diast dysfunction, mild AS, mildly elevated PA pressure    Social History  reports that he quit smoking about 36 years ago. He has never used smokeless tobacco. He reports that he does not drink alcohol or use illicit drugs.  Family History family history includes Alcohol abuse in his brother; Depression in his daughter; Diabetes in his brother and other; Heart attack in his brother; Heart disease in his mother; Hyperlipidemia in his father and mother; Hypertension in his father and mother; Kidney cancer in his sister; Stroke in his brother.   Review of Systems  Respiratory: Negative.   Cardiovascular: Negative.   Gastrointestinal: Negative.   Musculoskeletal: Negative.        Leg weakness  Skin: Negative.   Neurological: Negative.   Hematological: Negative.   Psychiatric/Behavioral: Negative.   All other systems reviewed and are negative.   BP 108/62 mmHg  Pulse 72  Ht 5\' 9"  (1.753 m)  Wt 177 lb (80.287 kg)  BMI 26.13 kg/m2  Physical Exam  Constitutional: He is oriented to person, place, and time. He appears well-developed and well-nourished.  HENT:  Head: Normocephalic.  Nose: Nose normal.  Mouth/Throat: Oropharynx is clear and moist.  Eyes: Conjunctivae are normal. Pupils are equal, round, and reactive to light.  Neck: Normal range of motion. Neck supple. No JVD present. Carotid bruit is present.  Cardiovascular: Normal rate, regular rhythm, S1 normal, S2 normal, normal heart sounds and intact distal  pulses.  Exam reveals no gallop and no friction rub.   No murmur heard. Pulmonary/Chest: Effort normal and breath sounds normal. No respiratory distress. He has no wheezes. He has no rales. He exhibits no tenderness.  Abdominal: Soft. Bowel sounds are normal. He exhibits no distension. There is no tenderness.  Musculoskeletal: Normal range of motion. He exhibits no edema or tenderness.  Lymphadenopathy:    He has no cervical adenopathy.  Neurological: He is alert and oriented to person, place, and time. Coordination normal.  Skin: Skin is warm and dry. No rash noted. No erythema.  Psychiatric: He has a normal mood and affect. His behavior is normal. Judgment and thought content normal.  Assessment and Plan   Nursing note and vitals reviewed.

## 2015-06-23 NOTE — Telephone Encounter (Signed)
Me either -please call pt to clarify  I will send this to both Maudie Mercury and [employer]

## 2015-06-25 MED ORDER — ALLOPURINOL 300 MG PO TABS: 300.0000 mg | ORAL_TABLET | Freq: Every day | ORAL | Status: DC

## 2015-06-25 NOTE — Telephone Encounter (Signed)
This has been previously filled by Dr Lissa Merlin [facility] for CLL.  Will refill but would have him request next refill from On[facility]

## 2015-06-26 DIAGNOSIS — I129 Hypertensive chronic kidney disease with stage 1 through stage 4 chronic kidney disease, or unspecified chronic kidney disease: Secondary | ICD-10-CM | POA: Diagnosis not present

## 2015-06-26 DIAGNOSIS — N189 Chronic kidney disease, unspecified: Secondary | ICD-10-CM | POA: Diagnosis not present

## 2015-06-26 DIAGNOSIS — C9110 Chronic lymphocytic leukemia of B-cell type not having achieved remission: Secondary | ICD-10-CM | POA: Diagnosis not present

## 2015-06-26 DIAGNOSIS — Z85828 Personal history of other malignant neoplasm of skin: Secondary | ICD-10-CM | POA: Diagnosis not present

## 2015-06-26 DIAGNOSIS — R197 Diarrhea, unspecified: Secondary | ICD-10-CM | POA: Diagnosis not present

## 2015-06-26 DIAGNOSIS — Z006 Encounter for examination for normal comparison and control in clinical research program: Secondary | ICD-10-CM | POA: Diagnosis not present

## 2015-07-11 DIAGNOSIS — Z85828 Personal history of other malignant neoplasm of skin: Secondary | ICD-10-CM | POA: Diagnosis not present

## 2015-07-11 DIAGNOSIS — L578 Other skin changes due to chronic exposure to nonionizing radiation: Secondary | ICD-10-CM | POA: Diagnosis not present

## 2015-07-11 DIAGNOSIS — L820 Inflamed seborrheic keratosis: Secondary | ICD-10-CM | POA: Diagnosis not present

## 2015-07-11 DIAGNOSIS — L570 Actinic keratosis: Secondary | ICD-10-CM | POA: Diagnosis not present

## 2015-09-13 DIAGNOSIS — Z6825 Body mass index (BMI) 25.0-25.9, adult: Secondary | ICD-10-CM | POA: Diagnosis not present

## 2015-09-13 DIAGNOSIS — Z7982 Long term (current) use of aspirin: Secondary | ICD-10-CM | POA: Diagnosis not present

## 2015-09-13 DIAGNOSIS — Z006 Encounter for examination for normal comparison and control in clinical research program: Secondary | ICD-10-CM | POA: Diagnosis not present

## 2015-09-13 DIAGNOSIS — D696 Thrombocytopenia, unspecified: Secondary | ICD-10-CM | POA: Diagnosis not present

## 2015-09-13 DIAGNOSIS — N184 Chronic kidney disease, stage 4 (severe): Secondary | ICD-10-CM | POA: Diagnosis not present

## 2015-09-13 DIAGNOSIS — C9110 Chronic lymphocytic leukemia of B-cell type not having achieved remission: Secondary | ICD-10-CM | POA: Diagnosis not present

## 2015-09-13 DIAGNOSIS — I129 Hypertensive chronic kidney disease with stage 1 through stage 4 chronic kidney disease, or unspecified chronic kidney disease: Secondary | ICD-10-CM | POA: Diagnosis not present

## 2015-09-13 DIAGNOSIS — E7800 Pure hypercholesterolemia, unspecified: Secondary | ICD-10-CM | POA: Diagnosis not present

## 2015-09-13 DIAGNOSIS — Z85828 Personal history of other malignant neoplasm of skin: Secondary | ICD-10-CM | POA: Diagnosis not present

## 2015-09-14 ENCOUNTER — Other Ambulatory Visit: Payer: Self-pay | Admitting: Family Medicine

## 2015-09-14 DIAGNOSIS — E785 Hyperlipidemia, unspecified: Secondary | ICD-10-CM

## 2015-09-14 DIAGNOSIS — R7303 Prediabetes: Secondary | ICD-10-CM

## 2015-09-14 DIAGNOSIS — I10 Essential (primary) hypertension: Secondary | ICD-10-CM

## 2015-09-14 DIAGNOSIS — N183 Chronic kidney disease, stage 3 unspecified: Secondary | ICD-10-CM

## 2015-09-14 DIAGNOSIS — E559 Vitamin D deficiency, unspecified: Secondary | ICD-10-CM

## 2015-09-14 DIAGNOSIS — C9110 Chronic lymphocytic leukemia of B-cell type not having achieved remission: Secondary | ICD-10-CM

## 2015-09-15 ENCOUNTER — Other Ambulatory Visit (INDEPENDENT_AMBULATORY_CARE_PROVIDER_SITE_OTHER): Payer: Medicare Other

## 2015-09-15 DIAGNOSIS — E559 Vitamin D deficiency, unspecified: Secondary | ICD-10-CM | POA: Diagnosis not present

## 2015-09-15 DIAGNOSIS — C9110 Chronic lymphocytic leukemia of B-cell type not having achieved remission: Secondary | ICD-10-CM

## 2015-09-15 DIAGNOSIS — R7303 Prediabetes: Secondary | ICD-10-CM | POA: Diagnosis not present

## 2015-09-15 DIAGNOSIS — E785 Hyperlipidemia, unspecified: Secondary | ICD-10-CM

## 2015-09-15 DIAGNOSIS — N183 Chronic kidney disease, stage 3 unspecified: Secondary | ICD-10-CM

## 2015-09-15 DIAGNOSIS — I10 Essential (primary) hypertension: Secondary | ICD-10-CM

## 2015-09-15 LAB — CBC WITH DIFFERENTIAL/PLATELET
Basophils Absolute: 0 10*3/uL (ref 0.0–0.1)
Basophils Relative: 0.5 % (ref 0.0–3.0)
Eosinophils Absolute: 0.3 10*3/uL (ref 0.0–0.7)
Eosinophils Relative: 3.8 % (ref 0.0–5.0)
HCT: 43.3 % (ref 39.0–52.0)
Hemoglobin: 14.4 g/dL (ref 13.0–17.0)
Lymphocytes Relative: 16.8 % (ref 12.0–46.0)
Lymphs Abs: 1.1 10*3/uL (ref 0.7–4.0)
MCHC: 33.2 g/dL (ref 30.0–36.0)
MCV: 92.4 fl (ref 78.0–100.0)
Monocytes Absolute: 0.6 10*3/uL (ref 0.1–1.0)
Monocytes Relative: 9.4 % (ref 3.0–12.0)
Neutro Abs: 4.7 10*3/uL (ref 1.4–7.7)
Neutrophils Relative %: 69.5 % (ref 43.0–77.0)
Platelets: 135 10*3/uL — ABNORMAL LOW (ref 150.0–400.0)
RBC: 4.69 Mil/uL (ref 4.22–5.81)
RDW: 14.9 % (ref 11.5–15.5)
WBC: 6.8 10*3/uL (ref 4.0–10.5)

## 2015-09-15 LAB — LIPID PANEL
Cholesterol: 96 mg/dL (ref 0–200)
HDL: 36.7 mg/dL — ABNORMAL LOW (ref >39.00)
LDL Cholesterol: 38 mg/dL (ref 0–99)
NonHDL: 58.96
Total CHOL/HDL Ratio: 3
Triglycerides: 103 mg/dL (ref 0.0–149.0)
VLDL: 20.6 mg/dL (ref 0.0–40.0)

## 2015-09-15 LAB — COMPREHENSIVE METABOLIC PANEL
ALT: 15 U/L (ref 0–53)
AST: 19 U/L (ref 0–37)
Albumin: 4.7 g/dL (ref 3.5–5.2)
Alkaline Phosphatase: 120 U/L — ABNORMAL HIGH (ref 39–117)
BUN: 18 mg/dL (ref 6–23)
CO2: 30 mEq/L (ref 19–32)
Calcium: 10.1 mg/dL (ref 8.4–10.5)
Chloride: 103 mEq/L (ref 96–112)
Creatinine, Ser: 1.41 mg/dL (ref 0.40–1.50)
GFR: 50.83 mL/min — ABNORMAL LOW (ref >60.00)
Glucose, Bld: 110 mg/dL — ABNORMAL HIGH (ref 70–99)
Potassium: 4.3 mEq/L (ref 3.5–5.1)
Sodium: 141 mEq/L (ref 135–145)
Total Bilirubin: 0.7 mg/dL (ref 0.2–1.2)
Total Protein: 7.3 g/dL (ref 6.0–8.3)

## 2015-09-15 LAB — HEMOGLOBIN A1C: Hgb A1c MFr Bld: 5.7 % (ref 4.6–6.5)

## 2015-09-15 LAB — URIC ACID: Uric Acid, Serum: 4.3 mg/dL (ref 4.0–7.8)

## 2015-09-15 LAB — VITAMIN D 25 HYDROXY (VIT D DEFICIENCY, FRACTURES): VITD: 52.45 ng/mL (ref 30.00–100.00)

## 2015-09-20 ENCOUNTER — Encounter: Payer: Medicare Other | Admitting: Family Medicine

## 2015-09-20 ENCOUNTER — Other Ambulatory Visit: Payer: Self-pay | Admitting: *Deleted

## 2015-09-20 ENCOUNTER — Ambulatory Visit (INDEPENDENT_AMBULATORY_CARE_PROVIDER_SITE_OTHER): Payer: Medicare Other

## 2015-09-20 VITALS — BP 118/62 | HR 75 | Temp 97.9°F | Ht 69.0 in | Wt 171.8 lb

## 2015-09-20 DIAGNOSIS — Z0000 Encounter for general adult medical examination without abnormal findings: Secondary | ICD-10-CM | POA: Diagnosis not present

## 2015-09-20 MED ORDER — LISINOPRIL 10 MG PO TABS: 10.0000 mg | ORAL_TABLET | Freq: Every day | ORAL | Status: DC

## 2015-09-20 NOTE — Progress Notes (Signed)
Pre visit review using our clinic review tool, if applicable. No additional management support is needed unless otherwise documented below in the visit note. 

## 2015-09-20 NOTE — Progress Notes (Signed)
PCP notes:   Health maintenance: No gaps identified or addressed.  Abnormal screenings: None  Patient concerns: Pt has multiple reddened areas to skin on abdomen and bilateral legs he that attributes to "chigger bites".   Nurse concerns: None  Next PCP appt: 10/05/15

## 2015-09-20 NOTE — Progress Notes (Signed)
   Subjective:    [name], male    DOB: 1928-12-25, 82 y.o.   MRN: [id number]  HPI I reviewed health advisor's note, was available for consultation, and agree with documentation and plan.    Review of Systems     Objective:   Physical Exam        Assessment & Plan:

## 2015-09-20 NOTE — Progress Notes (Signed)
Subjective:   Jimmy [name] is a 82 y.o. male who presents for Medicare Annual/Subsequent preventive examination.  Review of Systems:  N/A Cardiac Risk Factors include: advanced age (>66men, >44 women);male gender;dyslipidemia;hypertension     Objective:    Vitals: BP 118/62 mmHg  Pulse 75  Temp(Src) 97.9 F (36.6 C) (Oral)  Ht 5\' 9"  (1.753 m)  Wt 171 lb 12 oz (77.905 kg)  BMI 25.35 kg/m2  SpO2 93%  Body mass index is 25.35 kg/(m^2).  Tobacco History  Smoking status  . Former Smoker -- 2.00 packs/day for 30 years  . Quit date: 02/28/1979  Smokeless tobacco  . Never Used    Comment: quit 1980     Counseling given: No   Past Medical History  Diagnosis Date  . CAD (coronary artery disease)     a. 04/2001 S/P CABGx 4;  b. 2008 MV:  EF 63% normal perfusion.  [name] GERD (gastroesophageal reflux disease) 1990s  . Hyperlipemia 2002  . Hypertension 2002  . Thrombocytopenia ([place])     outpatient goals Hgb >9, plt >20  . History of herpes genitalis     valtrex daily  . Carotid stenosis     a. 11/2012 Carotid U/S:  RICA [id number], LICA [id number].  . Diastolic CHF ([place])     a. 07/2011 Echo: EF 55-65%, mild conc LVH, Gr 1 DD, mild AS, triv AI, mildly dil Ao root (3.5 cm).  . Mild aortic stenosis     a. 07/2011 Echo: Mild AS, triv AI.  [name] Chronic kidney disease, stage 3   . Mass of submandibular region 2015    referred to gen surg after chemo  . History of CVA (cerebrovascular accident) 2015    by MRI - remote L internal capsule  . CLL (chronic lymphoblastic leukemia) 2009    Stage IV; Dr. Ivor Messier - referred to Dr. Lissa Merlin [facility] 07/2013 now on Rituxan (10/2013) --> stage 0 (09/2014)   Past Surgical History  Procedure Laterality Date  . Hepatic artery angioplasty  [address] [place]  . Hemorroidectomy  1954    Fissure repair, [place]  . Kidney stone surgery  1977    Dr Redmond Baseman  . Lithotripsy  1990s    Multiple  . Angioplasty  1998  . Coronary angioplasty  1998  . Coronary  artery bypass graft  04/23/2001    x4, Dr. Pia Mau  . Esophagogastroduodenoscopy  11/29/1987    Gastritis  . Colonoscopy  11/29/1987    Normal  . Colonoscopy  02/07/2003    Hemm. Internal, focal proctitis, negative biopsy  . Colonoscopy  12/12/2004    Internal external hemorrhoids, +proctits, negative biopsy  . Cataract extraction, bilateral      R 1/09, L 8/09  . Ett  12/10/2006    Persantine myoview nml  . US echocardiography  07/2011    nl sys fxn, EF 55-60%, grade 1 diast dysfunction, mild AS, mildly elevated PA pressure   Family History  Problem Relation Age of Onset  . Hypertension Mother   . Heart disease Mother   . Hyperlipidemia Mother   . Hypertension Father   . Hyperlipidemia Father   . Kidney cancer Sister     Renal cell cancer  . Alcohol abuse Brother   . Diabetes Brother   . Stroke Brother   . Heart attack Brother     MI  . Diabetes Other     Sister's daughter  . Depression Daughter  Bipolar   History  Sexual Activity  . Sexual Activity: Yes    Outpatient Encounter Prescriptions as of 09/20/2015  Medication Sig  . allopurinol (ZYLOPRIM) 300 MG tablet Take 1 tablet (300 mg total) by mouth daily.  [name] aspirin EC 81 MG tablet Take 1 tablet (81 mg total) by mouth daily.  . Cholecalciferol (VITAMIN D3) 1000 UNITS CAPS Take 1 capsule (1,000 Units total) by mouth daily. (Patient taking differently: Take 2 capsules by mouth daily. )  . lisinopril (PRINIVIL,ZESTRIL) 10 MG tablet Take 1 tablet (10 mg total) by mouth daily.  . nitroGLYCERIN (NITROSTAT) 0.4 MG SL tablet Place 1 tablet (0.4 mg total) under the tongue every 5 (five) minutes as needed for chest pain (max 3 doses in 15 minutes).  . simvastatin (ZOCOR) 20 MG tablet Take 1 tablet (20 mg total) by mouth at bedtime.  . valACYclovir (VALTREX) 500 MG tablet TAKE 1 TABLET DAILY   No facility-administered encounter medications on file as of 09/20/2015.    Activities of Daily Living In your present state of  health, do you have any difficulty performing the following activities: 09/20/2015  Hearing? Y  Vision? N  Difficulty concentrating or making decisions? N  Walking or climbing stairs? N  Dressing or bathing? N  Doing errands, shopping? N  Preparing Food and eating ? N  Using the Toilet? N  In the past six months, have you accidently leaked urine? N  Do you have problems with loss of bowel control? N  Managing your Medications? N  Managing your Finances? N  Housekeeping or managing your Housekeeping? N    Patient Care Team: Ria Bush, MD as PCP - General (Family Medicine) Minna Merritts, MD as Consulting Physician (Cardiology)   Assessment:    Hearing Screening Comments: Wears bilateral hearing aids Vision Screening Comments: Last eye exam in 2016.   Exercise Activities and Dietary recommendations Current Exercise Habits: Structured exercise class, Type of exercise: strength training/weights, Time (Minutes): 30, Frequency (Times/Week): 7, Weekly Exercise (Minutes/Week): 210, Intensity: Moderate, Exercise limited by: None identified  Goals    . Increase physical activity     Starting 09/20/2015, I will continue to exercise for at least 30 min 6-7 days per week.       Fall Risk Fall Risk  09/20/2015 09/16/2014 08/30/2013 08/27/2012  Falls in the past year? No No No No   Depression Screen PHQ 2/9 Scores 09/20/2015 09/16/2014 08/30/2013 08/27/2012  PHQ - 2 Score 0 0 0 0    Cognitive Testing MMSE - Mini Mental State Exam 09/20/2015  Orientation to time 5  Orientation to Place 5  Registration 3  Attention/ Calculation 0  Recall 3  Language- name 2 objects 0  Language- repeat 1  Language- follow 3 step command 3  Language- read & follow direction 0  Write a sentence 0  Copy design 0  Total score 20   PLEASE NOTE: A Mini-Cog screen was completed. Maximum score is 20. A value of 0 denotes this part of [place] MMSE was not completed or the patient failed this part of the  Mini-Cog screening.   Mini-Cog Screening Orientation to Time - Max 5 pts Orientation to Place - Max 5 pts Registration - Max 3 pts Recall - Max 3 pts Language Repeat - Max 1 pts Language Follow 3 Step Command - Max 3 pts  Immunization History  Administered Date(s) Administered  . Influenza Split 03/01/2011  . Influenza Whole 01/09/2004, 01/16/2007, 12/21/2007, 01/04/2010  . Influenza,inj,Quad  PF,36+ Mos 01/07/2013, 12/29/2013, 01/02/2015  . Pneumococcal Conjugate-13 08/30/2013  . Pneumococcal Polysaccharide-23 04/01/1997  . Td 04/02/1995, 12/04/2005  . Zoster 09/19/2006   Screening Tests Health Maintenance  Topic Date Due  . INFLUENZA VACCINE  10/31/2015  . TETANUS/TDAP  12/05/2015  . DTaP/Tdap/Td  Completed  . ZOSTAVAX  Completed  . PNA vac Low Risk Adult  Completed      Plan:     I have personally reviewed and addressed the Medicare Annual Wellness questionnaire and have noted the following in the patient's chart:  A. Medical and social history B. Use of alcohol, tobacco or illicit drugs  C. Current medications and supplements D. Functional ability and status E.  Nutritional status F.  Physical activity G. Advance directives H. List of other physicians I.  Hospitalizations, surgeries, and ER visits in previous 12 months J.  [place] to include hearing, vision, cognitive, depression L. Referrals and appointments - none  In addition, I have reviewed and discussed with patient certain preventive protocols, quality metrics, and best practice recommendations. A written personalized care plan for preventive services as well as general preventive health recommendations were provided to patient.  See attached scanned questionnaire for additional information.   Signed,   Lindell Noe, MHA, BS, LPN Health Advisor

## 2015-09-20 NOTE — Patient Instructions (Signed)
Jimmy [name] , Thank you for taking time to come for your Medicare Wellness Visit. I appreciate your ongoing commitment to your health goals. Please review the following plan we discussed and let me know if I can assist you in the future.   These are the goals we discussed: Goals    . Increase physical activity     Starting 09/20/2015, I will continue to exercise for at least 30 min 6-7 days per week.        This is a list of the screening recommended for you and due dates:  Health Maintenance  Topic Date Due  . Flu Shot  10/31/2015  . Tetanus Vaccine  12/05/2015  . DTaP/Tdap/Td vaccine  Completed  . Shingles Vaccine  Completed  . Pneumonia vaccines  Completed    Preventive Care for Adults  A healthy lifestyle and preventive care can promote health and wellness. Preventive health guidelines for adults include the following key practices.  . A routine yearly physical is a good way to check with your health care provider about your health and preventive screening. It is a chance to share any concerns and updates on your health and to receive a thorough exam.  . Visit your dentist for a routine exam and preventive care every 6 months. Brush your teeth twice a day and floss once a day. Good oral hygiene prevents tooth decay and gum disease.  . The frequency of eye exams is based on your age, health, family medical history, use  of contact lenses, and other factors. Follow your health care provider's ecommendations for frequency of eye exams.  . Eat a healthy diet. Foods like vegetables, fruits, whole grains, low-fat dairy products, and lean protein foods contain the nutrients you need without too many calories. Decrease your intake of foods high in solid fats, added sugars, and salt. Eat the right amount of calories for you. Get information about a proper diet from your health care provider, if necessary.  . Regular physical exercise is one of the most important things you can do for  your health. Most adults should get at least 150 minutes of moderate-intensity exercise (any activity that increases your heart rate and causes you to sweat) each week. In addition, most adults need muscle-strengthening exercises on 2 or more days a week.  Silver Sneakers may be a benefit available to you. To determine eligibility, you may visit the website: www.silversneakers.com or contact program at (743) [phone] Mon-Fri between 8AM-8PM.   . Maintain a healthy weight. The body mass index (BMI) is a screening tool to identify possible weight problems. It provides an estimate of body fat based on height and weight. Your health care provider can find your BMI and can help you achieve or maintain a healthy weight.   For adults 20 years and older: ? A BMI below 18.5 is considered underweight. ? A BMI of 18.5 to 24.9 is normal. ? A BMI of 25 to 29.9 is considered overweight. ? A BMI of 30 and above is considered obese.   . Maintain normal blood lipids and cholesterol levels by exercising and minimizing your intake of saturated fat. Eat a balanced diet with plenty of fruit and vegetables. Blood tests for lipids and cholesterol should begin at age 34 and be repeated every 5 years. If your lipid or cholesterol levels are high, you are over 50, or you are at high risk for heart disease, you may need your cholesterol levels checked more frequently. Ongoing high lipid and  cholesterol levels should be treated with medicines if diet and exercise are not working.  . If you smoke, find out from your health care provider how to quit. If you do not use tobacco, please do not start.  . If you choose to drink alcohol, please do not consume more than 2 drinks per day. One drink is considered to be 12 ounces (355 mL) of beer, 5 ounces (148 mL) of wine, or 1.5 ounces (44 mL) of liquor.  . If you are 47-33 years old, ask your health care provider if you should take aspirin to prevent strokes.  . Use sunscreen.  Apply sunscreen liberally and repeatedly throughout the day. You should seek shade when your shadow is shorter than you. Protect yourself by wearing long sleeves, pants, a wide-brimmed hat, and sunglasses year round, whenever you are outdoors.  . Once a month, do a whole body skin exam, using a mirror to look at the skin on your back. Tell your health care provider of new moles, moles that have irregular borders, moles that are larger than a pencil eraser, or moles that have changed in shape or color.

## 2015-10-02 ENCOUNTER — Other Ambulatory Visit: Payer: Self-pay | Admitting: Family Medicine

## 2015-10-05 ENCOUNTER — Ambulatory Visit (INDEPENDENT_AMBULATORY_CARE_PROVIDER_SITE_OTHER): Payer: Medicare Other | Admitting: Family Medicine

## 2015-10-05 ENCOUNTER — Encounter: Payer: Self-pay | Admitting: Family Medicine

## 2015-10-05 VITALS — BP 114/66 | HR 76 | Temp 98.7°F | Wt 172.5 lb

## 2015-10-05 DIAGNOSIS — R7303 Prediabetes: Secondary | ICD-10-CM

## 2015-10-05 DIAGNOSIS — I6523 Occlusion and stenosis of bilateral carotid arteries: Secondary | ICD-10-CM | POA: Diagnosis not present

## 2015-10-05 DIAGNOSIS — I25709 Atherosclerosis of coronary artery bypass graft(s), unspecified, with unspecified angina pectoris: Secondary | ICD-10-CM | POA: Diagnosis not present

## 2015-10-05 DIAGNOSIS — C9110 Chronic lymphocytic leukemia of B-cell type not having achieved remission: Secondary | ICD-10-CM

## 2015-10-05 DIAGNOSIS — E559 Vitamin D deficiency, unspecified: Secondary | ICD-10-CM

## 2015-10-05 DIAGNOSIS — E785 Hyperlipidemia, unspecified: Secondary | ICD-10-CM

## 2015-10-05 DIAGNOSIS — I10 Essential (primary) hypertension: Secondary | ICD-10-CM | POA: Diagnosis not present

## 2015-10-05 DIAGNOSIS — N183 Chronic kidney disease, stage 3 unspecified: Secondary | ICD-10-CM

## 2015-10-05 NOTE — Assessment & Plan Note (Signed)
Chronic, stable on lower lisinopril 10mg  daily. Continue current dose

## 2015-10-05 NOTE — Assessment & Plan Note (Signed)
Chronic, stable. Continue aspirin, statin.

## 2015-10-05 NOTE — Assessment & Plan Note (Signed)
Reviewed FLP with patient - LDL and other chol levels actually all very low so will decrease simvastatin to 10mg  daily. Pt agrees with plan.

## 2015-10-05 NOTE — Patient Instructions (Addendum)
Decrease simvastatin to 10mg  daily (1/2 tablet daily). New dose updated on med list but not sent to pharmacy yet - let [facility] know to hold next refill.  You are doing well today. Return as needed or in 1 year for follow up visit.

## 2015-10-05 NOTE — Assessment & Plan Note (Signed)
Discussed avoiding added sugars. 

## 2015-10-05 NOTE — Assessment & Plan Note (Addendum)
Doing remarkably well. Appreciate [facility].

## 2015-10-05 NOTE — Assessment & Plan Note (Signed)
Followed by cards. Upcoming rpt [place] 10/2015.

## 2015-10-05 NOTE — Progress Notes (Signed)
BP 114/66 mmHg  Pulse 76  Temp(Src) 98.7 F (37.1 C) (Oral)  Wt 172 lb 8 oz (78.245 kg)   CC: f/u after medicare wellness visit  Subjective:    [name], male    DOB: 1929/03/30, 82 y.o.   MRN: [id number]  HPI: Jimmy [name] is a 82 y.o. male presenting on 10/05/2015 for Annual Exam   Saw Katha Cabal last week for medicare wellness visit. Note reviewed.  CLL - followed by [facility] Dr Lissa Merlin. Doing well. Off meds. Thinks will defer rpt bone marrow biopsy.   Carotid stenosis - yearly [place] by cardiology.   HLD - compliant with simvastaitn 60m nightly. No myalgia.   Relevant past medical, surgical, family and social history reviewed and updated as indicated. Interim medical history since our last visit reviewed. Allergies and medications reviewed and updated. Current Outpatient Prescriptions on File Prior to Visit  Medication Sig  . allopurinol (ZYLOPRIM) 300 MG tablet Take 1 tablet (300 mg total) by mouth daily.  .Marland Kitchenaspirin EC 81 MG tablet Take 1 tablet (81 mg total) by mouth daily.  .Marland Kitchenlisinopril (PRINIVIL,ZESTRIL) 10 MG tablet Take 1 tablet (10 mg total) by mouth daily.  . nitroGLYCERIN (NITROSTAT) 0.4 MG SL tablet Place 1 tablet (0.4 mg total) under the tongue every 5 (five) minutes as needed for chest pain (max 3 doses in 15 minutes).  . valACYclovir (VALTREX) 500 MG tablet TAKE 1 TABLET DAILY   No current facility-administered medications on file prior to visit.    Review of Systems Per HPI unless specifically indicated in ROS section     Objective:    BP 114/66 mmHg  Pulse 76  Temp(Src) 98.7 F (37.1 C) (Oral)  Wt 172 lb 8 oz (78.245 kg)  Wt Readings from Last 3 Encounters:  10/05/15 172 lb 8 oz (78.245 kg)  09/20/15 171 lb 12 oz (77.905 kg)  06/23/15 177 lb (80.287 kg)    Physical Exam  Constitutional: He appears well-developed and well-nourished. No distress.  HENT:  Mouth/Throat: Oropharynx is clear and moist. No oropharyngeal exudate.  Eyes:  Conjunctivae are normal. Pupils are equal, round, and reactive to light.  Neck: Normal range of motion. Neck supple.  Cardiovascular: Normal rate, regular rhythm, normal heart sounds and intact distal pulses.   No murmur heard. Pulmonary/Chest: Effort normal and breath sounds normal. No respiratory distress. He has no wheezes. He has no rales.  Musculoskeletal: He exhibits no edema.  Lymphadenopathy:    He has no cervical adenopathy.  Skin: Skin is warm and dry. No rash noted.  Healing bug bites throughout BLE  Psychiatric: He has a normal mood and affect. His behavior is normal. Thought content normal.  Nursing note and vitals reviewed.  Results for orders placed or performed in visit on 09/15/15  Lipid panel  Result Value Ref Range   Cholesterol 96 0 - 200 mg/dL   Triglycerides 103.0 0.0 - 149.0 mg/dL   HDL 36.70 (L) >39.00 mg/dL   VLDL 20.6 0.0 - 40.0 mg/dL   LDL Cholesterol 38 0 - 99 mg/dL   Total CHOL/HDL Ratio 3    NonHDL 58.96   Comprehensive metabolic panel  Result Value Ref Range   Sodium 141 135 - 145 mEq/L   Potassium 4.3 3.5 - 5.1 mEq/L   Chloride 103 96 - 112 mEq/L   CO2 30 19 - 32 mEq/L   Glucose, Bld 110 (H) 70 - 99 mg/dL   BUN 18 6 - 23  mg/dL   Creatinine, Ser 1.41 0.40 - 1.50 mg/dL   Total Bilirubin 0.7 0.2 - 1.2 mg/dL   Alkaline Phosphatase 120 (H) 39 - 117 U/L   AST 19 0 - 37 U/L   ALT 15 0 - 53 U/L   Total Protein 7.3 6.0 - 8.3 g/dL   Albumin 4.7 3.5 - 5.2 g/dL   Calcium 10.1 8.4 - 10.5 mg/dL   GFR 50.83 (L) >60.00 mL/min  Hemoglobin A1c  Result Value Ref Range   Hgb A1c MFr Bld 5.7 4.6 - 6.5 %  CBC with Differential/Platelet  Result Value Ref Range   WBC 6.8 4.0 - 10.5 K/uL   RBC 4.69 4.22 - 5.81 Mil/uL   Hemoglobin 14.4 13.0 - 17.0 g/dL   HCT 43.3 39.0 - 52.0 %   MCV 92.4 78.0 - 100.0 fl   MCHC 33.2 30.0 - 36.0 g/dL   RDW 14.9 11.5 - 15.5 %   Platelets 135.0 (L) 150.0 - 400.0 K/uL   Neutrophils Relative % 69.5 43.0 - 77.0 %   Lymphocytes  Relative 16.8 12.0 - 46.0 %   Monocytes Relative 9.4 3.0 - 12.0 %   Eosinophils Relative 3.8 0.0 - 5.0 %   Basophils Relative 0.5 0.0 - 3.0 %   Neutro Abs 4.7 1.4 - 7.7 K/uL   Lymphs Abs 1.1 0.7 - 4.0 K/uL   Monocytes Absolute 0.6 0.1 - 1.0 K/uL   Eosinophils Absolute 0.3 0.0 - 0.7 K/uL   Basophils Absolute 0.0 0.0 - 0.1 K/uL  VITAMIN D 25 Hydroxy (Vit-D Deficiency, Fractures)  Result Value Ref Range   VITD 52.45 30.00 - 100.00 ng/mL  Uric acid  Result Value Ref Range   Uric Acid, Serum 4.3 4.0 - 7.8 mg/dL      Assessment & Plan:   Problem List Items Addressed This Visit      Chronic   Chronic lymphocytic leukemia, Rai stage 0 ([place])    Doing remarkably well. Appreciate [facility].         Other   Hyperlipidemia - Primary    Reviewed FLP with patient - LDL and other chol levels actually all very low so will decrease simvastatin to 24m daily. Pt agrees with plan.      Relevant Medications   simvastatin (ZOCOR) 10 MG tablet   Essential hypertension    Chronic, stable on lower lisinopril 178mdaily. Continue current dose      Relevant Medications   simvastatin (ZOCOR) 10 MG tablet   Coronary atherosclerosis    Chronic, stable. Continue aspirin, statin.       Relevant Medications   simvastatin (ZOCOR) 10 MG tablet   Carotid artery stenosis    Followed by cards. Upcoming rpt [place].       Relevant Medications   simvastatin (ZOCOR) 10 MG tablet   Prediabetes    Discussed avoiding added sugars.       Vitamin D deficiency    Levels now normal with increased vit D to 2000 IU daily.       Chronic kidney disease, stage 3    Chronic, reviewed dx with patient.           Follow up plan: No Follow-up on file.  JaRia BushMD

## 2015-10-05 NOTE — Progress Notes (Signed)
Pre visit review using our clinic review tool, if applicable. No additional management support is needed unless otherwise documented below in the visit note. 

## 2015-10-05 NOTE — Assessment & Plan Note (Signed)
Chronic, reviewed dx with patient.

## 2015-10-05 NOTE — Assessment & Plan Note (Signed)
Levels now normal with increased vit D to 2000 IU daily.

## 2015-10-18 DIAGNOSIS — Z006 Encounter for examination for normal comparison and control in clinical research program: Secondary | ICD-10-CM | POA: Diagnosis not present

## 2015-10-18 DIAGNOSIS — C9110 Chronic lymphocytic leukemia of B-cell type not having achieved remission: Secondary | ICD-10-CM | POA: Diagnosis not present

## 2015-11-09 ENCOUNTER — Other Ambulatory Visit: Payer: Self-pay | Admitting: *Deleted

## 2015-11-09 MED ORDER — ALLOPURINOL 300 MG PO TABS: 300.0000 mg | ORAL_TABLET | Freq: Every day | ORAL | 1 refills | Status: DC

## 2016-01-01 ENCOUNTER — Ambulatory Visit (INDEPENDENT_AMBULATORY_CARE_PROVIDER_SITE_OTHER): Payer: Medicare Other

## 2016-01-01 DIAGNOSIS — Z23 Encounter for immunization: Secondary | ICD-10-CM

## 2016-01-10 ENCOUNTER — Other Ambulatory Visit: Payer: Self-pay | Admitting: Cardiovascular Disease

## 2016-01-10 ENCOUNTER — Other Ambulatory Visit: Payer: Self-pay

## 2016-01-10 DIAGNOSIS — I6523 Occlusion and stenosis of bilateral carotid arteries: Secondary | ICD-10-CM

## 2016-01-10 MED ORDER — VALACYCLOVIR HCL 500 MG PO TABS: ORAL_TABLET | ORAL | 3 refills | Status: DC

## 2016-01-10 NOTE — Telephone Encounter (Signed)
Pt left note requesting 90 day refill valacyclovir, last refilled # 90 x 3 on 12/15/14. Last seen 10/05/15.Please advise.

## 2016-01-17 DIAGNOSIS — I6523 Occlusion and stenosis of bilateral carotid arteries: Secondary | ICD-10-CM | POA: Diagnosis not present

## 2016-01-18 ENCOUNTER — Ambulatory Visit: Payer: Medicare Other

## 2016-01-18 DIAGNOSIS — I6523 Occlusion and stenosis of bilateral carotid arteries: Secondary | ICD-10-CM

## 2016-02-12 ENCOUNTER — Ambulatory Visit (INDEPENDENT_AMBULATORY_CARE_PROVIDER_SITE_OTHER): Payer: Medicare Other | Admitting: Cardiovascular Disease

## 2016-02-12 ENCOUNTER — Encounter: Payer: Self-pay | Admitting: Cardiovascular Disease

## 2016-02-12 ENCOUNTER — Telehealth: Payer: Self-pay | Admitting: Cardiovascular Disease

## 2016-02-12 VITALS — BP 132/64 | HR 64 | Ht 69.5 in | Wt 171.8 lb

## 2016-02-12 DIAGNOSIS — I25709 Atherosclerosis of coronary artery bypass graft(s), unspecified, with unspecified angina pectoris: Secondary | ICD-10-CM | POA: Diagnosis not present

## 2016-02-12 DIAGNOSIS — I10 Essential (primary) hypertension: Secondary | ICD-10-CM

## 2016-02-12 DIAGNOSIS — I209 Angina pectoris, unspecified: Secondary | ICD-10-CM

## 2016-02-12 DIAGNOSIS — I350 Nonrheumatic aortic (valve) stenosis: Secondary | ICD-10-CM | POA: Diagnosis not present

## 2016-02-12 DIAGNOSIS — I6523 Occlusion and stenosis of bilateral carotid arteries: Secondary | ICD-10-CM | POA: Diagnosis not present

## 2016-02-12 DIAGNOSIS — C9110 Chronic lymphocytic leukemia of B-cell type not having achieved remission: Secondary | ICD-10-CM

## 2016-02-12 DIAGNOSIS — E7800 Pure hypercholesterolemia, unspecified: Secondary | ICD-10-CM

## 2016-02-12 DIAGNOSIS — Z951 Presence of aortocoronary bypass graft: Secondary | ICD-10-CM

## 2016-02-12 NOTE — Patient Instructions (Signed)
Medication Instructions:   No medication changes made Try two aspirin 81 mg for possible TIA  Labwork:  No new labs needed  Testing/Procedures:  No further testing at this time   I recommend watching educational videos on topics of interest to you at:       www.goemmi.com  Enter code: HEARTCARE    Follow-Up: It was a pleasure seeing you in the office today. Please call us if you have new issues that need to be addressed before your next appt.  [phone]  Your physician wants you to follow-up in: 6 months.  You will receive a reminder letter in the mail two months in advance. If you don't receive a letter, please call our office to schedule the follow-up appointment.  If you need a refill on your cardiac medications before your next appointment, please call your pharmacy.

## 2016-02-12 NOTE — Telephone Encounter (Signed)
Patient in office today for appt c/o visual aberrations today.

## 2016-02-12 NOTE — Telephone Encounter (Signed)
Dr. Rockey Situ is seeing pt today.

## 2016-02-12 NOTE — Progress Notes (Signed)
Cardiology Office Note  Date:  02/12/2016   ID:  Jimmy [name], DOB [birth year], MRN [id number]  PCP:  Ria Bush, MD   Chief Complaint  Patient presents with  . other    6 month f/u c/o blurred vision and unable to focus today. Pt would like new Rx for Nitro last filled yr 2000. Meds reviewed verbally with pt.    HPI:  Jimmy [name] is a very pleasant 82 year-old male with a history of CLL (WBC ~ 50k), coronary artery disease, status post bypass surgery in 2001. Myoview in September 2008, which showed ejection fraction of 63% with no ischemia or infarct.  history of hyperlipidemia, hypertension,  carotid stensosis and minimal bilateral renal artery stenosis.  He has had 6 rounds of treatment of CLL, finished in December. Follow-up with hematology March 2016 He presents for followup of his coronary artery disease Negative stress test August 2015  In follow-up today he reports that he is feeling well Only concern was that This afternoon, he developed visual problem 1 1/2 hours Both eyes seemed blurry, unable to see up close or distance He laid down, took a nap Symptoms resolved on their own Denied any other neurologic issues Has not seen ophthalmology in several years  Not using NTG for chest pain Exercises on a regular basis  Lab work reviewed with him HBA1C 5.7 Total chol 96, LDL 38  Recent Carotid u/s 12/2015 reviewed with him  40 to 59% disease on the right Less than 39% on the left, Stable disease  EKG on today's visit shows normal sinus rhythm with rate 64 bpm, no significant ST or T-wave changes.   Other past medical history  completed 6 rounds of treatment, stopped in December 2015. He has follow-up with hematology oncology in March 2016  Recent lab work showing total cholesterol of 80 Carotid ultrasound with right greater than left disease, stable 60% on the right, 40% on the left EKG on today's visit shows normal sinus rhythm with rate 68 bpm, no  significant ST or T-wave changes  Previous echocardiogram showing normal ejection fraction, mild aortic valve stenosis Carotid u/s 123456: RICA [id number] LICA 40 to [id number].   PMH:   has a past medical history of CAD (coronary artery disease); Carotid stenosis; Chronic kidney disease, stage 3; CLL (chronic lymphoblastic leukemia) (2009); Diastolic CHF ([place]); GERD (gastroesophageal reflux disease) (1990s); History of CVA (cerebrovascular accident) (2015); History of herpes genitalis; Hyperlipemia (2002); Hypertension (2002); Mass of submandibular region (2015); Mild aortic stenosis; and Thrombocytopenia ([place]).  PSH:    Past Surgical History:  Procedure Laterality Date  . ANGIOPLASTY  1998  . CATARACT EXTRACTION, BILATERAL     R 1/09, L 8/09  . COLONOSCOPY  11/29/1987   Normal  . COLONOSCOPY  02/07/2003   Hemm. Internal, focal proctitis, negative biopsy  . COLONOSCOPY  12/12/2004   Internal external hemorrhoids, +proctits, negative biopsy  . CORONARY ANGIOPLASTY  1998  . CORONARY ARTERY BYPASS GRAFT  04/23/2001   x4, Dr. Pia Mau  . ESOPHAGOGASTRODUODENOSCOPY  11/29/1987   Gastritis  . ETT  12/10/2006   Persantine myoview nml  . HEMORROIDECTOMY  1954   Fissure repair, [place]  . HEPATIC ARTERY ANGIOPLASTY  1954   [place], [place]  . KIDNEY STONE SURGERY  1977   Dr Redmond Baseman  . LITHOTRIPSY  1990s   Multiple  . US ECHOCARDIOGRAPHY  07/2011   nl sys fxn, EF 55-60%, grade 1 diast dysfunction, mild AS, mildly elevated PA pressure  Current Outpatient Prescriptions  Medication Sig Dispense Refill  . allopurinol (ZYLOPRIM) 300 MG tablet Take 1 tablet (300 mg total) by mouth daily. 90 tablet 1  . aspirin EC 81 MG tablet Take 1 tablet (81 mg total) by mouth daily. 90 tablet 3  . cholecalciferol (VITAMIN D) 1000 units tablet Take 2,000 Units by mouth daily.    [name] lisinopril (PRINIVIL,ZESTRIL) 10 MG tablet Take 1 tablet (10 mg total) by mouth daily. 90 tablet 3  . nitroGLYCERIN (NITROSTAT) 0.4 MG SL  tablet Place 1 tablet (0.4 mg total) under the tongue every 5 (five) minutes as needed for chest pain (max 3 doses in 15 minutes). 25 tablet 3  . simvastatin (ZOCOR) 10 MG tablet Take 1 tablet (10 mg total) by mouth at bedtime. 90 tablet 3  . valACYclovir (VALTREX) 500 MG tablet TAKE 1 TABLET DAILY 90 tablet 3   No current facility-administered medications for this visit.      Allergies:   New skin and Tape   Social History:  The patient  reports that he quit smoking about 36 years ago. He has a 60.00 pack-year smoking history. He has never used smokeless tobacco. He reports that he does not drink alcohol or use drugs.   Family History:   family history includes Alcohol abuse in his brother; Depression in his daughter; Diabetes in his brother and other; Heart attack in his brother; Heart disease in his mother; Hyperlipidemia in his father and mother; Hypertension in his father and mother; Kidney cancer in his sister; Stroke in his brother.    Review of Systems: Review of Systems  Constitutional: Negative.   Respiratory: Negative.   Cardiovascular: Negative.   Gastrointestinal: Negative.   Musculoskeletal: Negative.   Neurological: Negative.   Psychiatric/Behavioral: Negative.   All other systems reviewed and are negative.    PHYSICAL EXAM: VS:  BP 132/64 (BP Location: Left Arm, Patient Position: Sitting, Cuff Size: Normal)   Pulse 64   Ht 5' 9.5" (1.765 m)   Wt 171 lb 12 oz (77.9 kg)   BMI 25.00 kg/m  , BMI Body mass index is 25 kg/m. GEN: Well nourished, well developed, in no acute distress  HEENT: normal  Neck: no JVD, carotid bruits, or masses Cardiac: RRR; 2+ SEM RSB murmurs, rubs, or gallops,no edema  Respiratory:  clear to auscultation bilaterally, normal work of breathing GI: soft, nontender, nondistended, + BS MS: no deformity or atrophy  Skin: warm and dry, no rash Neuro:  Strength and sensation are intact Psych: euthymic mood, full affect    Recent  Labs: 09/15/2015: ALT 15; BUN 18; Creatinine, Ser 1.41; Hemoglobin 14.4; Platelets 135.0; Potassium 4.3; Sodium 141    Lipid Panel Lab Results  Component Value Date   CHOL 96 09/15/2015   HDL 36.70 (L) 09/15/2015   LDLCALC 38 09/15/2015   TRIG 103.0 09/15/2015      Wt Readings from Last 3 Encounters:  02/12/16 171 lb 12 oz (77.9 kg)  10/05/15 172 lb 8 oz (78.2 kg)  09/20/15 171 lb 12 oz (77.9 kg)       ASSESSMENT AND PLAN:  Essential hypertension - Plan: EKG 12-Lead Blood pressure is well controlled on today's visit. No changes made to the medications.  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 No further workup at this time. Continue current medication regimen.  Aortic valve stenosis, etiology of cardiac  valve disease unspecified Mild aortic valve stenosis in 2015  Bilateral carotid artery stenosis Stable disease, will need periodic monitoring every 2 years  Pure hypercholesterolemia Cholesterol is at goal on the current lipid regimen. No changes to the medications were made.  Chronic lymphocytic leukemia, Rai stage 0 ([place]) Reports that he is clear from cancer S/P CABG (coronary artery bypass graft) Currently with no symptoms of angina. No further workup at this time. Continue current medication regimen.  Visual deficit/disturbance Etiology unclear, unable to exclude small TIA Recommended he increase aspirin up to 81 mg 2 He did not want add Plavix at this time Recommended he call our office if he has any recurrent symptoms or neurologic issues   Total encounter time more than 25 minutes  Greater than 50% was spent in counseling and coordination of care with the patient  Disposition:   F/U  6 months   Orders Placed This Encounter  Procedures  . EKG 12-Lead     Signed, Esmond Plants, M.D., Ph.D. 02/12/2016  [place], [place]

## 2016-02-15 DIAGNOSIS — C44319 Basal cell carcinoma of skin of other parts of face: Secondary | ICD-10-CM | POA: Diagnosis not present

## 2016-02-15 DIAGNOSIS — Z85828 Personal history of other malignant neoplasm of skin: Secondary | ICD-10-CM | POA: Diagnosis not present

## 2016-02-15 DIAGNOSIS — L821 Other seborrheic keratosis: Secondary | ICD-10-CM | POA: Diagnosis not present

## 2016-02-15 DIAGNOSIS — L578 Other skin changes due to chronic exposure to nonionizing radiation: Secondary | ICD-10-CM | POA: Diagnosis not present

## 2016-02-15 DIAGNOSIS — Z1283 Encounter for screening for malignant neoplasm of skin: Secondary | ICD-10-CM | POA: Diagnosis not present

## 2016-02-15 DIAGNOSIS — L820 Inflamed seborrheic keratosis: Secondary | ICD-10-CM | POA: Diagnosis not present

## 2016-02-15 DIAGNOSIS — D485 Neoplasm of uncertain behavior of skin: Secondary | ICD-10-CM | POA: Diagnosis not present

## 2016-02-15 DIAGNOSIS — C4441 Basal cell carcinoma of skin of scalp and neck: Secondary | ICD-10-CM | POA: Diagnosis not present

## 2016-02-15 DIAGNOSIS — L570 Actinic keratosis: Secondary | ICD-10-CM | POA: Diagnosis not present

## 2016-02-19 ENCOUNTER — Emergency Department: Payer: Medicare Other

## 2016-02-19 ENCOUNTER — Telehealth: Payer: Self-pay | Admitting: Cardiovascular Disease

## 2016-02-19 ENCOUNTER — Emergency Department
Admission: EM | Admit: 2016-02-19 | Discharge: 2016-02-19 | Disposition: A | Discharge status: Home / Self Care | Payer: Medicare Other | Attending: Emergency Medicine | Admitting: Emergency Medicine

## 2016-02-19 ENCOUNTER — Encounter: Payer: Self-pay | Admitting: Medical Oncology

## 2016-02-19 DIAGNOSIS — G459 Transient cerebral ischemic attack, unspecified: Secondary | ICD-10-CM | POA: Diagnosis not present

## 2016-02-19 DIAGNOSIS — H539 Unspecified visual disturbance: Secondary | ICD-10-CM

## 2016-02-19 DIAGNOSIS — G450 Vertebro-basilar artery syndrome: Secondary | ICD-10-CM | POA: Diagnosis not present

## 2016-02-19 DIAGNOSIS — I2510 Atherosclerotic heart disease of native coronary artery without angina pectoris: Secondary | ICD-10-CM | POA: Diagnosis not present

## 2016-02-19 DIAGNOSIS — Z7982 Long term (current) use of aspirin: Secondary | ICD-10-CM | POA: Insufficient documentation

## 2016-02-19 DIAGNOSIS — G458 Other transient cerebral ischemic attacks and related syndromes: Secondary | ICD-10-CM | POA: Insufficient documentation

## 2016-02-19 DIAGNOSIS — I130 Hypertensive heart and chronic kidney disease with heart failure and stage 1 through stage 4 chronic kidney disease, or unspecified chronic kidney disease: Secondary | ICD-10-CM | POA: Insufficient documentation

## 2016-02-19 DIAGNOSIS — H538 Other visual disturbances: Secondary | ICD-10-CM | POA: Diagnosis present

## 2016-02-19 DIAGNOSIS — Z79899 Other long term (current) drug therapy: Secondary | ICD-10-CM | POA: Diagnosis not present

## 2016-02-19 DIAGNOSIS — I6623 Occlusion and stenosis of bilateral posterior cerebral arteries: Secondary | ICD-10-CM | POA: Diagnosis not present

## 2016-02-19 DIAGNOSIS — Z87891 Personal history of nicotine dependence: Secondary | ICD-10-CM | POA: Diagnosis not present

## 2016-02-19 DIAGNOSIS — I5030 Unspecified diastolic (congestive) heart failure: Secondary | ICD-10-CM | POA: Insufficient documentation

## 2016-02-19 DIAGNOSIS — N183 Chronic kidney disease, stage 3 (moderate): Secondary | ICD-10-CM | POA: Diagnosis not present

## 2016-02-19 DIAGNOSIS — I639 Cerebral infarction, unspecified: Secondary | ICD-10-CM

## 2016-02-19 DIAGNOSIS — I6521 Occlusion and stenosis of right carotid artery: Secondary | ICD-10-CM | POA: Diagnosis not present

## 2016-02-19 LAB — COMPREHENSIVE METABOLIC PANEL
ALT: 19 U/L (ref 17–63)
AST: 26 U/L (ref 15–41)
Albumin: 4.2 g/dL (ref 3.5–5.0)
Alkaline Phosphatase: 101 U/L (ref 38–126)
Anion gap: 7 (ref 5–15)
BUN: 20 mg/dL (ref 6–20)
CO2: 28 mmol/L (ref 22–32)
Calcium: 9.4 mg/dL (ref 8.9–10.3)
Chloride: 102 mmol/L (ref 101–111)
Creatinine, Ser: 1.3 mg/dL — ABNORMAL HIGH (ref 0.61–1.24)
GFR calc Af Amer: 56 mL/min — ABNORMAL LOW (ref >60)
GFR calc non Af Amer: 49 mL/min — ABNORMAL LOW (ref >60)
Glucose, Bld: 120 mg/dL — ABNORMAL HIGH (ref 65–99)
Potassium: 4.4 mmol/L (ref 3.5–5.1)
Sodium: 137 mmol/L (ref 135–145)
Total Bilirubin: 0.7 mg/dL (ref 0.3–1.2)
Total Protein: 7.1 g/dL (ref 6.5–8.1)

## 2016-02-19 LAB — CBC
HCT: 43.5 % (ref 40.0–52.0)
Hemoglobin: 14.9 g/dL (ref 13.0–18.0)
MCH: 31.6 pg (ref 26.0–34.0)
MCHC: 34.2 g/dL (ref 32.0–36.0)
MCV: 92.5 fL (ref 80.0–100.0)
Platelets: 114 10*3/uL — ABNORMAL LOW (ref 150–440)
RBC: 4.7 MIL/uL (ref 4.40–5.90)
RDW: 14.3 % (ref 11.5–14.5)
WBC: 6.3 10*3/uL (ref 3.8–10.6)

## 2016-02-19 LAB — DIFFERENTIAL
Basophils Absolute: 0 10*3/uL (ref 0–0.1)
Basophils Relative: 0 %
Eosinophils Absolute: 0.1 10*3/uL (ref 0–0.7)
Eosinophils Relative: 2 %
Lymphocytes Relative: 15 %
Lymphs Abs: 1 10*3/uL (ref 1.0–3.6)
Monocytes Absolute: 0.6 10*3/uL (ref 0.2–1.0)
Monocytes Relative: 10 %
Neutro Abs: 4.6 10*3/uL (ref 1.4–6.5)
Neutrophils Relative %: 73 %

## 2016-02-19 LAB — GLUCOSE, CAPILLARY: Glucose-Capillary: 113 mg/dL — ABNORMAL HIGH (ref 65–99)

## 2016-02-19 LAB — PROTIME-INR
INR: 1.05
Prothrombin Time: 13.7 seconds (ref 11.4–15.2)

## 2016-02-19 LAB — APTT: aPTT: 34 seconds (ref 24–36)

## 2016-02-19 LAB — TROPONIN I: Troponin I: <0.03 ng/mL (ref <0.03)

## 2016-02-19 MED ORDER — CLOPIDOGREL BISULFATE 75 MG PO TABS
75.0000 mg | ORAL_TABLET | Freq: Every day | ORAL | 11 refills | Status: DC
Start: 2016-02-19 — End: 2016-03-21

## 2016-02-19 MED ORDER — IOPAMIDOL (ISOVUE-370) INJECTION 76%
75.0000 mL | Freq: Once | INTRAVENOUS | Status: AC | PRN
  Administered 2016-02-19: 75 mL via INTRAVENOUS

## 2016-02-19 NOTE — Consult Note (Signed)
Referring Physician: Cinda Quest    Chief Complaint: Dizziness, difficulty with vision  HPI: Jimmy [name] is an 82 y.o. male with a history of CAD and TIA who awakened today at baseline.  At about 0815 while reading the comics had the onset of dizziness and blurry vision.  PCP was called and patient referred to the ED.  Symptoms had resolved by arrival.  Patient reports a similar episode about a week ago that resolved after about an hour as well.  He saw his cardiologist after this event and had his ASA increased since he did not want to start Plavix.   Reports that he has had TIA's for about 40 years.  Until last week though he has not had them in years.   Carotid doppler performed on 10/192017 shows 123456 RICA and [id number] LICA disease.  Last echo 2015.   Initial NIHSS of 0.    Date last known well: Date: 02/19/2016 Time last known well: Time: 08:15 tPA Given: No: Resolution of symptoms  Past Medical History:  Diagnosis Date  . CAD (coronary artery disease)    a. 04/2001 S/P CABGx 4;  b. 2008 MV:  EF 63% normal perfusion.  . Carotid stenosis    a. 11/2012 Carotid U/S:  RICA [id number], LICA 123456.  . Chronic kidney disease, stage 3   . CLL (chronic lymphoblastic leukemia) 2009   Stage IV; Dr. Ivor Messier - referred to Dr. Lissa Merlin [facility] 07/2013 now on Rituxan (10/2013) --> stage 0 (09/2014)  . Diastolic CHF ([place])    a. [id number] Echo: EF 55-65%, mild conc LVH, Gr 1 DD, mild AS, triv AI, mildly dil Ao root (3.5 cm).  . GERD (gastroesophageal reflux disease) 1990s  . History of CVA (cerebrovascular accident) 2015   by MRI - remote L internal capsule  . History of herpes genitalis    valtrex daily  . Hyperlipemia 2002  . Hypertension 2002  . Mass of submandibular region 2015   referred to gen surg after chemo  . Mild aortic stenosis    a. 07/2011 Echo: Mild AS, triv AI.  [name] Thrombocytopenia ([place])    outpatient goals Hgb >9, plt >20    Past Surgical History:  Procedure Laterality Date  .  ANGIOPLASTY  1998  . CATARACT EXTRACTION, BILATERAL     R 1/09, L 8/09  . COLONOSCOPY  11/29/1987   Normal  . COLONOSCOPY  02/07/2003   Hemm. Internal, focal proctitis, negative biopsy  . COLONOSCOPY  12/12/2004   Internal external hemorrhoids, +proctits, negative biopsy  . CORONARY ANGIOPLASTY  1998  . CORONARY ARTERY BYPASS GRAFT  04/23/2001   x4, Dr. Pia Mau  . ESOPHAGOGASTRODUODENOSCOPY  11/29/1987   Gastritis  . ETT  12/10/2006   Persantine myoview nml  . HEMORROIDECTOMY  1954   Fissure repair, [place]  . HEPATIC ARTERY ANGIOPLASTY  1954   [place], [place]  . KIDNEY STONE SURGERY  1977   Dr Redmond Baseman  . LITHOTRIPSY  1990s   Multiple  . US ECHOCARDIOGRAPHY  07/2011   nl sys fxn, EF 55-60%, grade 1 diast dysfunction, mild AS, mildly elevated PA pressure    Family History  Problem Relation Age of Onset  . Hypertension Mother   . Heart disease Mother   . Hyperlipidemia Mother   . Hypertension Father   . Hyperlipidemia Father   . Kidney cancer Sister     Renal cell cancer  . Alcohol abuse Brother   . Diabetes Brother   . Stroke Brother   .  Heart attack Brother     MI  . Diabetes Other     Sister's daughter  . Depression Daughter     Bipolar   Social History:  reports that he quit smoking about 37 years ago. He has a 60.00 pack-year smoking history. He has never used smokeless tobacco. He reports that he does not drink alcohol or use drugs.  Allergies:  Allergies  Allergen Reactions  . New Skin   . Tape Dermatitis    Medications: I have reviewed the patient's current medications. Prior to Admission:  Prior to Admission medications   Medication Sig Start Date End Date Taking? Authorizing Provider  allopurinol (ZYLOPRIM) 300 MG tablet Take 1 tablet (300 mg total) by mouth daily. 11/09/15  Yes Ria Bush, MD  aspirin EC 81 MG tablet Take 1 tablet (81 mg total) by mouth daily. 05/18/14  Yes Minna Merritts, MD  cholecalciferol (VITAMIN D) 1000 units tablet Take  2,000 Units by mouth daily.   Yes Historical Provider, MD  [facility] MELATONIN PO Take 1 tablet by mouth at bedtime.   Yes Historical Provider, MD  lisinopril (PRINIVIL,ZESTRIL) 10 MG tablet Take 1 tablet (10 mg total) by mouth daily. 09/20/15  Yes Ria Bush, MD  nitroGLYCERIN (NITROSTAT) 0.4 MG SL tablet Place 1 tablet (0.4 mg total) under the tongue every 5 (five) minutes as needed for chest pain (max 3 doses in 15 minutes). 05/18/14  Yes Minna Merritts, MD  simvastatin (ZOCOR) 10 MG tablet Take 1 tablet (10 mg total) by mouth at bedtime. 10/05/15  Yes Ria Bush, MD  valACYclovir (VALTREX) 500 MG tablet TAKE 1 TABLET DAILY 01/10/16  Yes Ria Bush, MD     ROS: History obtained from the patient  General ROS: negative for - chills, fatigue, fever, night sweats, weight gain or weight loss Psychological ROS: negative for - behavioral disorder, hallucinations, memory difficulties, mood swings or suicidal ideation Ophthalmic ROS: negative for - blurry vision, double vision, eye pain or loss of vision ENT ROS: negative for - epistaxis, nasal discharge, oral lesions, sore throat Allergy and Immunology ROS: negative for - hives or itchy/watery eyes Hematological and Lymphatic ROS: negative for - bleeding problems, bruising or swollen lymph nodes Endocrine ROS: negative for - galactorrhea, hair pattern changes, polydipsia/polyuria or temperature intolerance Respiratory ROS: negative for - cough, hemoptysis, shortness of breath or wheezing Cardiovascular ROS: negative for - chest pain, dyspnea on exertion, edema or irregular heartbeat Gastrointestinal ROS: negative for - abdominal pain, diarrhea, hematemesis, nausea/vomiting or stool incontinence Genito-Urinary ROS: negative for - dysuria, hematuria, incontinence or urinary frequency/urgency Musculoskeletal ROS: negative for - joint swelling or muscular weakness Neurological ROS: as noted in HPI Dermatological ROS: negative for rash and  skin lesion changes  Physical Examination: Blood pressure (!) 178/79, pulse 64, resp. rate 18, height 5\' 9"  (1.753 m), weight 77.6 kg (171 lb), SpO2 98 %.  HEENT-  Normocephalic, no lesions, without obvious abnormality.  Normal external eye and conjunctiva.  Normal TM's bilaterally.  Normal auditory canals and external ears. Normal external nose, mucus membranes and septum.  Normal pharynx. Cardiovascular- S1, S2 normal, pulses palpable throughout   Lungs- chest clear, no wheezing, rales, normal symmetric air entry Abdomen- soft, non-tender; bowel sounds normal; no masses,  no organomegaly Extremities- no edema Lymph-no adenopathy palpable Musculoskeletal-no joint tenderness, deformity or swelling Skin-warm and dry, no hyperpigmentation, vitiligo, or suspicious lesions  Neurological Examination Mental Status: Alert, oriented, thought content appropriate.  Speech fluent without evidence of aphasia.  Able to follow 3 step commands without difficulty. Cranial Nerves: II: Discs flat bilaterally; Visual fields grossly normal, pupils equal, round, reactive to light and accommodation III,IV, VI: ptosis not present, extra-ocular motions intact bilaterally V,VII: smile symmetric, facial light touch sensation normal bilaterally VIII: hearing normal bilaterally IX,X: gag reflex present XI: bilateral shoulder shrug XII: midline tongue extension Motor: Right : Upper extremity   5/5    Left:     Upper extremity   5/5  Lower extremity   5/5     Lower extremity   5/5 Tone and bulk:normal tone throughout; no atrophy noted Sensory: Pinprick and light touch intact throughout, bilaterally Deep Tendon Reflexes: 2+ and symmetric with absent AJ's bilaterally Plantars: Right: downgoing   Left: downgoing Cerebellar: Normal finger-to-nose and normal heel-to-shin testing bilaterally Gait: not tested due to safety concerns    Laboratory Studies:  Basic Metabolic Panel: No results for input(s): NA, K, CL,  CO2, GLUCOSE, BUN, CREATININE, CALCIUM, MG, PHOS in the last 168 hours.  Liver Function Tests: No results for input(s): AST, ALT, ALKPHOS, BILITOT, PROT, ALBUMIN in the last 168 hours. No results for input(s): LIPASE, AMYLASE in the last 168 hours. No results for input(s): AMMONIA in the last 168 hours.  CBC: No results for input(s): WBC, NEUTROABS, HGB, HCT, MCV, PLT in the last 168 hours.  Cardiac Enzymes: No results for input(s): CKTOTAL, CKMB, CKMBINDEX, TROPONINI in the last 168 hours.  BNP: Invalid input(s): POCBNP  CBG:  Recent Labs Lab 02/19/16 0955  GLUCAP 113*    Microbiology: Results for orders placed or performed in visit on 08/17/13  TECHNOLOGIST REVIEW     Status: None   Collection Time: 08/17/13 12:01 PM  Result Value Ref Range Status   Technologist Review Variant lymphs present, Few Smudge cells  Final    Coagulation Studies: No results for input(s): LABPROT, INR in the last 72 hours.  Urinalysis: No results for input(s): COLORURINE, LABSPEC, PHURINE, GLUCOSEU, HGBUR, BILIRUBINUR, KETONESUR, PROTEINUR, UROBILINOGEN, NITRITE, LEUKOCYTESUR in the last 168 hours.  Invalid input(s): APPERANCEUR  Lipid Panel:    Component Value Date/Time   CHOL 96 09/15/2015 0809   TRIG 103.0 09/15/2015 0809   HDL 36.70 (L) 09/15/2015 0809   CHOLHDL 3 09/15/2015 0809   VLDL 20.6 09/15/2015 0809   LDLCALC 38 09/15/2015 0809    HgbA1C:  Lab Results  Component Value Date   HGBA1C 5.7 09/15/2015    Urine Drug Screen:  No results found for: LABOPIA, COCAINSCRNUR, LABBENZ, AMPHETMU, THCU, LABBARB  Alcohol Level: No results for input(s): ETH in the last 168 hours.  Other results: EKG: sinus rhythm at 69 bpm.  Imaging: Ct Head Code Stroke Wo Contrast`  Result Date: 02/19/2016 CLINICAL DATA:  Code stroke. Dizziness and loss of vision this morning. EXAM: CT HEAD WITHOUT CONTRAST TECHNIQUE: Contiguous axial images were obtained from the base of the skull through the  vertex without intravenous contrast. COMPARISON:  None FINDINGS: Brain: No evidence of acute infarction, hemorrhage, hydrocephalus, extra-axial collection or mass lesion/mass effect. Chronic microvascular disease with ischemic gliosis throughout cerebral white matter. Remote lacunar infarcts in the left caudate head, left thalamus, right corona radiata, and posterior left putamen. Vascular: Atherosclerotic calcification.  No hyperdense vessel. Skull: No acute or aggressive finding Sinuses/Orbits: No acute finding Other: These results were called by telephone at the time of interpretation on 02/19/2016 at 10:10 am to Dr. Cinda Quest, who verbally acknowledged these results. ASPECTS [facility] Stroke Program Early CT Score) - Ganglionic level infarction (caudate,  lentiform nuclei, internal capsule, insula, M1-M3 cortex): 7 - Supraganglionic infarction (M4-M6 cortex): 3 Total score (0-10 with 10 being normal): 10 IMPRESSION: 1. No acute finding.ASPECTS is 10. 2. Advanced chronic microvascular disease with multiple remote lacunar infarcts. Electronically Signed   By: Monte Fantasia M.D.   On: 02/19/2016 10:12    Assessment: 82 y.o. male presenting after an episode of dizziness and blurred vision.  Had similar episode about a week ago.  ASA increased at that time.  Patient now at baseline.  Head CT personally reviewed and shows no acute changes.  Has had some recent testing done.    Stroke Risk Factors - hyperlipidemia and hypertension  Plan: 1. CTA of the head and neck 2. Decrease ASA to 81 mg and start Plavix 75mg  daily 3. Patient to follow up with Dr. Rockey Situ for possible holter versus LINQ monitor  4. Patient to have further evaluation by neurology as an outpatient   Case discussed with Dr. Willette Cluster, MD Neurology [phone] 02/19/2016, 10:21 AM  Addendum: CTA shows RICA bulb 70% stenosis.  Patient reports that he is aware.  Options discussed.  Patient does not wish for any invasive  procedures.  Will continue with plan for ASA and Plavix.    Alexis Goodell, MD Neurology [phone]

## 2016-02-19 NOTE — Discharge Instructions (Signed)
Least follow up with your doctor. Also your cardiologist Dr. Clelia Schaumann L ON. Please take the Plavix one a day starting today. Please return immediately for any further problems like you experience today.

## 2016-02-19 NOTE — ED Triage Notes (Signed)
Pt reports that he was reading the paper and at 0915 pt began having blurred vision. Pt denies headache denies other sx's. Pt reports the episode of blurred vision lasted approx 1.5 hrs. Pt reports sx's have now resolved and he feels normal.

## 2016-02-19 NOTE — ED Notes (Signed)
Pt alert and oriented X4, active, cooperative, pt in NAD. RR even and unlabored, color WNL.  Pt informed to return if any life threatening symptoms occur.   

## 2016-02-19 NOTE — ED Provider Notes (Signed)
[facility] Emergency Department Provider Note   ____________________________________________   First MD Initiated Contact with Patient 02/19/16 [phone]     (approximate)  I have reviewed the triage vital signs and the nursing notes.   HISTORY  Chief Complaint Blurred Vision    HPI Jimmy [name] is a 82 y.o. male patient reports he was at home reading, ask when he began having trouble following the lines and understanding what he was reading. Came on suddenly and lasted approximately an hour to an hour and a half and resolved in the emergency room as I was examining him. He had no weakness numbness difficulty moving difficulty with speech or any other complaints.  Past Medical History:  Diagnosis Date  . CAD (coronary artery disease)    a. 04/2001 S/P CABGx 4;  b. 2008 MV:  EF 63% normal perfusion.  . Carotid stenosis    a. 11/2012 Carotid U/S:  RICA [id number], LICA [id number].  . Chronic kidney disease, stage 3   . CLL (chronic lymphoblastic leukemia) 2009   Stage IV; Dr. Ivor Messier - referred to Dr. Lissa Merlin [facility] 07/2013 now on Rituxan (10/2013) --> stage 0 (09/2014)  . Diastolic CHF ([place])    a. [id number] Echo: EF 55-65%, mild conc LVH, Gr 1 DD, mild AS, triv AI, mildly dil Ao root (3.5 cm).  . GERD (gastroesophageal reflux disease) 1990s  . History of CVA (cerebrovascular accident) 2015   by MRI - remote L internal capsule  . History of herpes genitalis    valtrex daily  . Hyperlipemia 2002  . Hypertension 2002  . Mass of submandibular region 2015   referred to gen surg after chemo  . Mild aortic stenosis    a. 07/2011 Echo: Mild AS, triv AI.  [name] Thrombocytopenia ([place])    outpatient goals Hgb >9, plt >20    Patient Active Problem List   Diagnosis Date Noted  . Leg weakness 11/11/2014  . Advanced care planning/counseling discussion 09/16/2014  . Nocturia 09/16/2014  . Mass of submandibular region   . History of CVA (cerebrovascular accident)   . Chest  pressure 11/19/2013  . S/P CABG (coronary artery bypass graft) 11/19/2013  . Chronic kidney disease, stage 3   . Aortic stenosis   . Medicare annual wellness visit, subsequent 08/27/2012  . Vitamin D deficiency 08/17/2012  . Dyspnea 06/26/2011  . Erectile dysfunction 09/18/2010  . Carotid artery stenosis 12/01/2009  . UNSPECIFIED SUBJECTIVE VISUAL DISTURBANCE 05/01/2009  . BACK PAIN, LUMBAR 10/24/2008  . Chronic lymphocytic leukemia, Rai stage 0 ([place]) 04/02/2007    Class: Chronic  . DERMATITIS, SEBORRHEIC NOS 12/16/2006  . GENITAL HERPES 12/04/2006  . Hyperlipidemia 12/04/2006  . Essential hypertension 12/04/2006  . Coronary atherosclerosis 12/04/2006  . GERD 12/04/2006  . Prediabetes 12/04/2006  . RENAL CALCULUS, HX OF 12/04/2006    Past Surgical History:  Procedure Laterality Date  . ANGIOPLASTY  1998  . CATARACT EXTRACTION, BILATERAL     R 1/09, L 8/09  . COLONOSCOPY  11/29/1987   Normal  . COLONOSCOPY  02/07/2003   Hemm. Internal, focal proctitis, negative biopsy  . COLONOSCOPY  12/12/2004   Internal external hemorrhoids, +proctits, negative biopsy  . CORONARY ANGIOPLASTY  1998  . CORONARY ARTERY BYPASS GRAFT  04/23/2001   x4, Dr. Pia Mau  . ESOPHAGOGASTRODUODENOSCOPY  11/29/1987   Gastritis  . ETT  12/10/2006   Persantine myoview nml  . HEMORROIDECTOMY  1954   Fissure repair, [place]  . HEPATIC ARTERY ANGIOPLASTY  [address] [place]  . KIDNEY STONE SURGERY  1977   Dr Redmond Baseman  . LITHOTRIPSY  1990s   Multiple  . US ECHOCARDIOGRAPHY  07/2011   nl sys fxn, EF 55-60%, grade 1 diast dysfunction, mild AS, mildly elevated PA pressure    Prior to Admission medications   Medication Sig Start Date End Date Taking? Authorizing Provider  allopurinol (ZYLOPRIM) 300 MG tablet Take 1 tablet (300 mg total) by mouth daily. 11/09/15  Yes Ria Bush, MD  aspirin EC 81 MG tablet Take 1 tablet (81 mg total) by mouth daily. 05/18/14  Yes Minna Merritts, MD  cholecalciferol  (VITAMIN D) 1000 units tablet Take 2,000 Units by mouth daily.   Yes Historical Provider, MD  [facility] MELATONIN PO Take 1 tablet by mouth at bedtime.   Yes Historical Provider, MD  lisinopril (PRINIVIL,ZESTRIL) 10 MG tablet Take 1 tablet (10 mg total) by mouth daily. 09/20/15  Yes Ria Bush, MD  nitroGLYCERIN (NITROSTAT) 0.4 MG SL tablet Place 1 tablet (0.4 mg total) under the tongue every 5 (five) minutes as needed for chest pain (max 3 doses in 15 minutes). 05/18/14  Yes Minna Merritts, MD  simvastatin (ZOCOR) 10 MG tablet Take 1 tablet (10 mg total) by mouth at bedtime. 10/05/15  Yes Ria Bush, MD  valACYclovir (VALTREX) 500 MG tablet TAKE 1 TABLET DAILY 01/10/16  Yes Ria Bush, MD    Allergies New skin and Tape  Family History  Problem Relation Age of Onset  . Hypertension Mother   . Heart disease Mother   . Hyperlipidemia Mother   . Hypertension Father   . Hyperlipidemia Father   . Kidney cancer Sister     Renal cell cancer  . Alcohol abuse Brother   . Diabetes Brother   . Stroke Brother   . Heart attack Brother     MI  . Diabetes Other     Sister's daughter  . Depression Daughter     Bipolar    Social History Social History  Substance Use Topics  . Smoking status: Former Smoker    Packs/day: 2.00    Years: 30.00    Quit date: 02/28/1979  . Smokeless tobacco: Never Used     Comment: quit 1980  . Alcohol use No     Comment: occasional wine    Review of Systems Constitutional: No fever/chills Eyes:See history of present illness ENT: No sore throat. Cardiovascular: Denies chest pain. Respiratory: Denies shortness of breath. Gastrointestinal: No abdominal pain.  No nausea, no vomiting.  No diarrhea.  No constipation. Genitourinary: Negative for dysuria. Musculoskeletal: Negative for back pain. Skin: Negative for rash. Neurological: Negative for headaches, focal weakness or numbness.  10-point ROS otherwise  negative.  ____________________________________________   PHYSICAL EXAM:  VITAL SIGNS: ED Triage Vitals  Enc Vitals Group     BP      Pulse      Resp      Temp      Temp src      SpO2      Weight      Height      Head Circumference      Peak Flow      Pain Score      Pain Loc      Pain Edu?      Excl. in [place]?     Constitutional: Alert and oriented. Well appearing and in no acute distress. Eyes: Conjunctivae are normal. PERRL. EOMI. Head: Atraumatic.  Nose: No congestion/rhinnorhea. Mouth/Throat: Mucous membranes are moist.  Oropharynx non-erythematous. Neck: No stridor. Cardiovascular: Normal rate, regular rhythm. Grossly normal heart sounds.  Good peripheral circulation. Respiratory: Normal respiratory effort.  No retractions. Lungs CTAB. Gastrointestinal: Soft and nontender. No distention. No abdominal bruits. No CVA tenderness. Musculoskeletal: No lower extremity tenderness nor edema.  No joint effusions. Neurologic:  Normal speech and language. No gross focal neurologic deficits are appreciated. Cranial nerves II through XII are intact cerebellar finger-nose is normal rapid alternating movements and hands are normal motor strength is 5 over 5 throughout and sensation is intact throughout. No gait instability. Skin:  Skin is warm, dry and intact. No rash noted. Psychiatric: Mood and affect are normal. Speech and behavior are normal.  ____________________________________________   LABS (all labs ordered are listed, but only abnormal results are displayed)  Labs Reviewed  GLUCOSE, CAPILLARY - Abnormal; Notable for the following:       Result Value   Glucose-Capillary 113 (*)    All other components within normal limits  CBC - Abnormal; Notable for the following:    Platelets 114 (*)    All other components within normal limits  COMPREHENSIVE METABOLIC PANEL - Abnormal; Notable for the following:    Glucose, Bld 120 (*)    Creatinine, Ser 1.30 (*)    GFR calc non  Af Amer 49 (*)    GFR calc Af Amer 56 (*)    All other components within normal limits  PROTIME-INR  APTT  DIFFERENTIAL  TROPONIN I  CBG MONITORING, ED   ____________________________________________  EKG  EKG read and inserted by me shows normal sinus rhythm rate of 69 normal axis no acute ST-T wave changes ____________________________________________  RADIOLOGY  CT brain read by radiology as no acute disease per phone call from radiologist ____________________________________________   PROCEDURES  Procedure(s) performed:  Procedures  Critical Care performed:   ____________________________________________   INITIAL IMPRESSION / ASSESSMENT AND PLAN / ED COURSE  Pertinent labs & imaging results that were available during my care of the patient were reviewed by me and considered in my medical decision making (see chart for details).    Clinical Course     After Doy Mince sees the patient and assumes care. She feels that probably he can get a CT angiogram and go home as he's had a recent carotid echo and many of the other tests are evaluating TIA. ____________________________________________   FINAL CLINICAL IMPRESSION(S) / ED DIAGNOSES  Final diagnoses:  Other specified transient cerebral ischemias      NEW MEDICATIONS STARTED DURING THIS VISIT:  New Prescriptions   No medications on file     Note:  This document was prepared using Dragon voice recognition software and may include unintentional dictation errors.    Nena Polio, MD 02/19/16 1140

## 2016-02-19 NOTE — ED Notes (Signed)
Code  Stroke  Called  To 333 

## 2016-02-19 NOTE — Telephone Encounter (Signed)
Pt wife stating pt has had a TIA this morning He is very dizzy almost fell over this morning when he went to get water  And he can't see to read is very worried  Would like to know what to do Please advise.

## 2016-02-19 NOTE — Telephone Encounter (Signed)
Spoke w/ pt's wife.  She states that she believes pt had a TIA just a few minutes ago. He became dizzy and lost his vision.  Vision has not returned. Advised her to call 911 or take pt to the ED for evaluation. She is agreeable and will proceed there now.

## 2016-03-18 DIAGNOSIS — C9110 Chronic lymphocytic leukemia of B-cell type not having achieved remission: Secondary | ICD-10-CM | POA: Diagnosis not present

## 2016-03-18 DIAGNOSIS — E790 Hyperuricemia without signs of inflammatory arthritis and tophaceous disease: Secondary | ICD-10-CM | POA: Diagnosis not present

## 2016-03-18 DIAGNOSIS — Z8673 Personal history of transient ischemic attack (TIA), and cerebral infarction without residual deficits: Secondary | ICD-10-CM | POA: Diagnosis not present

## 2016-03-18 DIAGNOSIS — Z7902 Long term (current) use of antithrombotics/antiplatelets: Secondary | ICD-10-CM | POA: Diagnosis not present

## 2016-03-18 DIAGNOSIS — I129 Hypertensive chronic kidney disease with stage 1 through stage 4 chronic kidney disease, or unspecified chronic kidney disease: Secondary | ICD-10-CM | POA: Diagnosis not present

## 2016-03-18 DIAGNOSIS — Z85828 Personal history of other malignant neoplasm of skin: Secondary | ICD-10-CM | POA: Diagnosis not present

## 2016-03-18 DIAGNOSIS — E785 Hyperlipidemia, unspecified: Secondary | ICD-10-CM | POA: Diagnosis not present

## 2016-03-18 DIAGNOSIS — E7800 Pure hypercholesterolemia, unspecified: Secondary | ICD-10-CM | POA: Diagnosis not present

## 2016-03-18 DIAGNOSIS — N189 Chronic kidney disease, unspecified: Secondary | ICD-10-CM | POA: Diagnosis not present

## 2016-03-18 DIAGNOSIS — D696 Thrombocytopenia, unspecified: Secondary | ICD-10-CM | POA: Diagnosis not present

## 2016-03-18 DIAGNOSIS — Z79899 Other long term (current) drug therapy: Secondary | ICD-10-CM | POA: Diagnosis not present

## 2016-03-21 ENCOUNTER — Other Ambulatory Visit: Payer: Self-pay

## 2016-03-21 MED ORDER — CLOPIDOGREL BISULFATE 75 MG PO TABS: 75.0000 mg | ORAL_TABLET | Freq: Every day | ORAL | 2 refills | Status: DC

## 2016-03-21 NOTE — Telephone Encounter (Signed)
Pt was given rx for plavix by ED physician with refills for 1 yr. Pt [facility]; advised done.

## 2016-04-08 ENCOUNTER — Emergency Department (HOSPITAL_COMMUNITY): Payer: Medicare Other

## 2016-04-08 ENCOUNTER — Encounter (HOSPITAL_COMMUNITY): Payer: Self-pay

## 2016-04-08 ENCOUNTER — Observation Stay (HOSPITAL_COMMUNITY)
Admission: EM | Admit: 2016-04-08 | Discharge: 2016-04-09 | Disposition: A | Discharge status: Home / Self Care | Payer: Medicare Other | Attending: Internal Medicine | Admitting: Internal Medicine

## 2016-04-08 DIAGNOSIS — Z7902 Long term (current) use of antithrombotics/antiplatelets: Secondary | ICD-10-CM | POA: Diagnosis not present

## 2016-04-08 DIAGNOSIS — E785 Hyperlipidemia, unspecified: Secondary | ICD-10-CM | POA: Diagnosis not present

## 2016-04-08 DIAGNOSIS — E782 Mixed hyperlipidemia: Secondary | ICD-10-CM | POA: Diagnosis present

## 2016-04-08 DIAGNOSIS — Z79899 Other long term (current) drug therapy: Secondary | ICD-10-CM | POA: Diagnosis not present

## 2016-04-08 DIAGNOSIS — Z951 Presence of aortocoronary bypass graft: Secondary | ICD-10-CM | POA: Insufficient documentation

## 2016-04-08 DIAGNOSIS — N183 Chronic kidney disease, stage 3 unspecified: Secondary | ICD-10-CM | POA: Diagnosis present

## 2016-04-08 DIAGNOSIS — G459 Transient cerebral ischemic attack, unspecified: Principal | ICD-10-CM | POA: Insufficient documentation

## 2016-04-08 DIAGNOSIS — I350 Nonrheumatic aortic (valve) stenosis: Secondary | ICD-10-CM | POA: Insufficient documentation

## 2016-04-08 DIAGNOSIS — Z7982 Long term (current) use of aspirin: Secondary | ICD-10-CM | POA: Diagnosis not present

## 2016-04-08 DIAGNOSIS — Z8673 Personal history of transient ischemic attack (TIA), and cerebral infarction without residual deficits: Secondary | ICD-10-CM | POA: Insufficient documentation

## 2016-04-08 DIAGNOSIS — R278 Other lack of coordination: Secondary | ICD-10-CM | POA: Diagnosis not present

## 2016-04-08 DIAGNOSIS — G451 Carotid artery syndrome (hemispheric): Secondary | ICD-10-CM | POA: Diagnosis not present

## 2016-04-08 DIAGNOSIS — I129 Hypertensive chronic kidney disease with stage 1 through stage 4 chronic kidney disease, or unspecified chronic kidney disease: Secondary | ICD-10-CM | POA: Insufficient documentation

## 2016-04-08 DIAGNOSIS — Z87891 Personal history of nicotine dependence: Secondary | ICD-10-CM | POA: Diagnosis not present

## 2016-04-08 DIAGNOSIS — D696 Thrombocytopenia, unspecified: Secondary | ICD-10-CM | POA: Diagnosis not present

## 2016-04-08 DIAGNOSIS — A6000 Herpesviral infection of urogenital system, unspecified: Secondary | ICD-10-CM | POA: Diagnosis not present

## 2016-04-08 DIAGNOSIS — M542 Cervicalgia: Secondary | ICD-10-CM | POA: Diagnosis not present

## 2016-04-08 DIAGNOSIS — R200 Anesthesia of skin: Secondary | ICD-10-CM | POA: Diagnosis present

## 2016-04-08 DIAGNOSIS — C9111 Chronic lymphocytic leukemia of B-cell type in remission: Secondary | ICD-10-CM | POA: Diagnosis not present

## 2016-04-08 DIAGNOSIS — Z8249 Family history of ischemic heart disease and other diseases of the circulatory system: Secondary | ICD-10-CM | POA: Diagnosis not present

## 2016-04-08 DIAGNOSIS — R4701 Aphasia: Secondary | ICD-10-CM | POA: Diagnosis not present

## 2016-04-08 DIAGNOSIS — I6529 Occlusion and stenosis of unspecified carotid artery: Secondary | ICD-10-CM | POA: Diagnosis present

## 2016-04-08 DIAGNOSIS — I2510 Atherosclerotic heart disease of native coronary artery without angina pectoris: Secondary | ICD-10-CM | POA: Diagnosis not present

## 2016-04-08 DIAGNOSIS — I160 Hypertensive urgency: Secondary | ICD-10-CM | POA: Insufficient documentation

## 2016-04-08 DIAGNOSIS — C9110 Chronic lymphocytic leukemia of B-cell type not having achieved remission: Secondary | ICD-10-CM | POA: Diagnosis present

## 2016-04-08 DIAGNOSIS — I10 Essential (primary) hypertension: Secondary | ICD-10-CM | POA: Diagnosis present

## 2016-04-08 LAB — COMPREHENSIVE METABOLIC PANEL
ALT: 21 U/L (ref 17–63)
AST: 25 U/L (ref 15–41)
Albumin: 4.1 g/dL (ref 3.5–5.0)
Alkaline Phosphatase: 106 U/L (ref 38–126)
Anion gap: 8 (ref 5–15)
BUN: 16 mg/dL (ref 6–20)
CO2: 26 mmol/L (ref 22–32)
Calcium: 9.7 mg/dL (ref 8.9–10.3)
Chloride: 103 mmol/L (ref 101–111)
Creatinine, Ser: 1.36 mg/dL — ABNORMAL HIGH (ref 0.61–1.24)
GFR calc Af Amer: 53 mL/min — ABNORMAL LOW (ref >60)
GFR calc non Af Amer: 46 mL/min — ABNORMAL LOW (ref >60)
Glucose, Bld: 124 mg/dL — ABNORMAL HIGH (ref 65–99)
Potassium: 4.2 mmol/L (ref 3.5–5.1)
Sodium: 137 mmol/L (ref 135–145)
Total Bilirubin: 0.7 mg/dL (ref 0.3–1.2)
Total Protein: 6.9 g/dL (ref 6.5–8.1)

## 2016-04-08 LAB — CBC
HCT: 42.5 % (ref 39.0–52.0)
Hemoglobin: 14.8 g/dL (ref 13.0–17.0)
MCH: 31.2 pg (ref 26.0–34.0)
MCHC: 34.8 g/dL (ref 30.0–36.0)
MCV: 89.5 fL (ref 78.0–100.0)
Platelets: 128 10*3/uL — ABNORMAL LOW (ref 150–400)
RBC: 4.75 MIL/uL (ref 4.22–5.81)
RDW: 13.4 % (ref 11.5–15.5)
WBC: 6.2 10*3/uL (ref 4.0–10.5)

## 2016-04-08 LAB — URINALYSIS, ROUTINE W REFLEX MICROSCOPIC
Bacteria, UA: NONE SEEN
Bilirubin Urine: NEGATIVE
Glucose, UA: NEGATIVE mg/dL
Ketones, ur: NEGATIVE mg/dL
Leukocytes, UA: NEGATIVE
Nitrite: NEGATIVE
Protein, ur: NEGATIVE mg/dL
Specific Gravity, Urine: 1.006 (ref 1.005–1.030)
Squamous Epithelial / LPF: NONE SEEN
pH: 6 (ref 5.0–8.0)

## 2016-04-08 LAB — DIFFERENTIAL
Basophils Absolute: 0 10*3/uL (ref 0.0–0.1)
Basophils Relative: 0 %
Eosinophils Absolute: 0.2 10*3/uL (ref 0.0–0.7)
Eosinophils Relative: 2 %
Lymphocytes Relative: 17 %
Lymphs Abs: 1 10*3/uL (ref 0.7–4.0)
Monocytes Absolute: 0.4 10*3/uL (ref 0.1–1.0)
Monocytes Relative: 7 %
Neutro Abs: 4.6 10*3/uL (ref 1.7–7.7)
Neutrophils Relative %: 74 %

## 2016-04-08 LAB — I-STAT CHEM 8, ED
BUN: 18 mg/dL (ref 6–20)
Calcium, Ion: 1.27 mmol/L (ref 1.15–1.40)
Chloride: 102 mmol/L (ref 101–111)
Creatinine, Ser: 1.4 mg/dL — ABNORMAL HIGH (ref 0.61–1.24)
Glucose, Bld: 118 mg/dL — ABNORMAL HIGH (ref 65–99)
HCT: 43 % (ref 39.0–52.0)
Hemoglobin: 14.6 g/dL (ref 13.0–17.0)
Potassium: 4.3 mmol/L (ref 3.5–5.1)
Sodium: 138 mmol/L (ref 135–145)
TCO2: 27 mmol/L (ref 0–100)

## 2016-04-08 LAB — PROTIME-INR
INR: 1.07
Prothrombin Time: 14 seconds (ref 11.4–15.2)

## 2016-04-08 LAB — I-STAT TROPONIN, ED: Troponin i, poc: 0 ng/mL (ref 0.00–0.08)

## 2016-04-08 LAB — RAPID URINE DRUG SCREEN, HOSP PERFORMED
Amphetamines: NOT DETECTED
Barbiturates: NOT DETECTED
Benzodiazepines: NOT DETECTED
Cocaine: NOT DETECTED
Opiates: NOT DETECTED
Tetrahydrocannabinol: NOT DETECTED

## 2016-04-08 LAB — ETHANOL: Alcohol, Ethyl (B): <5 mg/dL (ref <5)

## 2016-04-08 LAB — APTT: aPTT: 32 seconds (ref 24–36)

## 2016-04-08 MED ORDER — ACETAMINOPHEN 325 MG PO TABS: 650.0000 mg | ORAL_TABLET | ORAL | Status: DC | PRN

## 2016-04-08 MED ORDER — ACETAMINOPHEN 160 MG/5ML PO SOLN: 650.0000 mg | ORAL | Status: DC | PRN

## 2016-04-08 MED ORDER — ACETAMINOPHEN 500 MG PO TABS
1000.0000 mg | ORAL_TABLET | Freq: Once | ORAL | Status: AC
  Administered 2016-04-08: 1000 mg via ORAL
  Filled 2016-04-08: qty 2

## 2016-04-08 MED ORDER — ASPIRIN EC 81 MG PO TBEC
81.0000 mg | DELAYED_RELEASE_TABLET | Freq: Every day | ORAL | Status: DC
  Administered 2016-04-09: 81 mg via ORAL
  Filled 2016-04-08 (×2): qty 1

## 2016-04-08 MED ORDER — STROKE: EARLY STAGES OF RECOVERY BOOK: Freq: Once | Status: DC

## 2016-04-08 MED ORDER — SIMVASTATIN 5 MG PO TABS
10.0000 mg | ORAL_TABLET | Freq: Every day | ORAL | Status: DC
  Administered 2016-04-08: 10 mg via ORAL
  Filled 2016-04-08: qty 2

## 2016-04-08 MED ORDER — SENNOSIDES-DOCUSATE SODIUM 8.6-50 MG PO TABS: 1.0000 | ORAL_TABLET | Freq: Every evening | ORAL | Status: DC | PRN

## 2016-04-08 MED ORDER — HYDRALAZINE HCL 20 MG/ML IJ SOLN
10.0000 mg | Freq: Once | INTRAMUSCULAR | Status: AC
  Administered 2016-04-08: 10 mg via INTRAVENOUS
  Filled 2016-04-08: qty 1

## 2016-04-08 MED ORDER — ACETAMINOPHEN 650 MG RE SUPP: 650.0000 mg | RECTAL | Status: DC | PRN

## 2016-04-08 MED ORDER — VALACYCLOVIR HCL 500 MG PO TABS
500.0000 mg | ORAL_TABLET | Freq: Every day | ORAL | Status: DC
  Administered 2016-04-09: 500 mg via ORAL
  Filled 2016-04-08: qty 1

## 2016-04-08 MED ORDER — ALLOPURINOL 100 MG PO TABS
300.0000 mg | ORAL_TABLET | Freq: Every day | ORAL | Status: DC
  Administered 2016-04-08: 300 mg via ORAL
  Filled 2016-04-08 (×2): qty 3

## 2016-04-08 MED ORDER — CLOPIDOGREL BISULFATE 75 MG PO TABS
75.0000 mg | ORAL_TABLET | Freq: Every day | ORAL | Status: DC
  Administered 2016-04-08: 75 mg via ORAL
  Filled 2016-04-08 (×2): qty 1

## 2016-04-08 MED ORDER — ASPIRIN 81 MG PO CHEW
264.0000 mg | CHEWABLE_TABLET | Freq: Once | ORAL | Status: AC
  Administered 2016-04-08: 264 mg via ORAL
  Filled 2016-04-08: qty 4

## 2016-04-08 MED ORDER — NITROGLYCERIN 0.4 MG SL SUBL: 0.4000 mg | SUBLINGUAL_TABLET | SUBLINGUAL | Status: DC | PRN

## 2016-04-08 MED ORDER — HEPARIN SODIUM (PORCINE) 5000 UNIT/ML IJ SOLN
5000.0000 [IU] | Freq: Three times a day (TID) | INTRAMUSCULAR | Status: DC
  Administered 2016-04-08 – 2016-04-09 (×3): 5000 [IU] via SUBCUTANEOUS
  Filled 2016-04-08 (×3): qty 1

## 2016-04-08 NOTE — H&P (Signed)
[facility] History and Physical   Patient: Jimmy [name] [id number]   PCP: Ria Bush, MD DOB: March 04, 1929   DOA: 04/08/2016   DOS: 04/08/2016   DOS: the patient was seen and examined on 04/08/2016  Patient coming from: The patient is coming from home.  Chief Complaint: Vision changes  HPI: Jimmy [name] is a 83 y.o. male with Past medical history of HTN, CLL, CABG, carotid artery stenosis. Patient presented with complains of left-sided numbness along with weakness and aphasia started at around 12 noon. Last her for 40-45 minutes and then resolved on its own. Patient presented to ER and mention that his symptoms are totally resolved. The time of my evaluation he complained about neck pain on the back. Denies any dizziness or lightheadedness no focal deficit no chest pain and abdominal pain and nausea no vomiting or shortness of breath. Denies any fever or chills at home. He is blood pressure has been running higher recently and they have been adjusting his blood pressure medication as an outpatient. No other change in medications reported.  ED Course: CT of the head was unremarkable, neurology was consulted. Patient was recommended to be admitted for TIA workup.  At his baseline ambulates without any support And is independent for most of his ADL; manages his medication on his own.  Review of Systems: as mentioned in the history of present illness.  All other systems reviewed and are negative.  Past Medical History:  Diagnosis Date  . CAD (coronary artery disease)    a. 04/2001 S/P CABGx 4;  b. 2008 MV:  EF 63% normal perfusion.  . Carotid stenosis    a. 11/2012 Carotid U/S:  RICA [id number], LICA [id number].  . Chronic kidney disease, stage 3   . CLL (chronic lymphoblastic leukemia) 2009   Stage IV; Dr. Ivor Messier - referred to Dr. Lissa Merlin [facility] 07/2013 now on Rituxan (10/2013) --> stage 0 (09/2014)  . Diastolic CHF ([place])    a. [id number] Echo: EF 55-65%, mild conc LVH, Gr 1 DD,  mild AS, triv AI, mildly dil Ao root (3.5 cm).  . GERD (gastroesophageal reflux disease) 1990s  . History of CVA (cerebrovascular accident) 2015   by MRI - remote L internal capsule  . History of herpes genitalis    valtrex daily  . Hyperlipemia 2002  . Hypertension 2002  . Mass of submandibular region 2015   referred to gen surg after chemo  . Mild aortic stenosis    a. 07/2011 Echo: Mild AS, triv AI.  [name] Thrombocytopenia ([place])    outpatient goals Hgb >9, plt >20   Past Surgical History:  Procedure Laterality Date  . ANGIOPLASTY  1998  . CATARACT EXTRACTION, BILATERAL     R 1/09, L 8/09  . COLONOSCOPY  11/29/1987   Normal  . COLONOSCOPY  02/07/2003   Hemm. Internal, focal proctitis, negative biopsy  . COLONOSCOPY  12/12/2004   Internal external hemorrhoids, +proctits, negative biopsy  . CORONARY ANGIOPLASTY  1998  . CORONARY ARTERY BYPASS GRAFT  04/23/2001   x4, Dr. Pia Mau  . ESOPHAGOGASTRODUODENOSCOPY  11/29/1987   Gastritis  . ETT  12/10/2006   Persantine myoview nml  . HEMORROIDECTOMY  1954   Fissure repair, [place]  . HEPATIC ARTERY ANGIOPLASTY  1954   [place], [place]  . KIDNEY STONE SURGERY  1977   Dr Redmond Baseman  . LITHOTRIPSY  1990s   Multiple  . US ECHOCARDIOGRAPHY  07/2011   nl sys fxn, EF 55-60%, grade 1  diast dysfunction, mild AS, mildly elevated PA pressure   Social History:  reports that he quit smoking about 37 years ago. He has a 60.00 pack-year smoking history. He has never used smokeless tobacco. He reports that he does not drink alcohol or use drugs.  Allergies  Allergen Reactions  . New Skin   . Tape Dermatitis     Family History  Problem Relation Age of Onset  . Hypertension Mother   . Heart disease Mother   . Hyperlipidemia Mother   . Hypertension Father   . Hyperlipidemia Father   . Kidney cancer Sister     Renal cell cancer  . Alcohol abuse Brother   . Diabetes Brother   . Stroke Brother   . Heart attack Brother     MI  . Diabetes Other      Sister's daughter  . Depression Daughter     Bipolar     Prior to Admission medications   Medication Sig Start Date End Date Taking? Authorizing Provider  allopurinol (ZYLOPRIM) 300 MG tablet Take 1 tablet (300 mg total) by mouth daily. 11/09/15  Yes Ria Bush, MD  aspirin EC 81 MG tablet Take 1 tablet (81 mg total) by mouth daily. 05/18/14  Yes Minna Merritts, MD  cholecalciferol (VITAMIN D) 1000 units tablet Take 2,000 Units by mouth daily.   Yes Historical Provider, MD  clopidogrel (PLAVIX) 75 MG tablet Take 1 tablet (75 mg total) by mouth daily. 03/21/16 03/21/17 Yes Ria Bush, MD  [facility] MELATONIN PO Take 1 tablet by mouth at bedtime.   Yes Historical Provider, MD  lisinopril (PRINIVIL,ZESTRIL) 10 MG tablet Take 1 tablet (10 mg total) by mouth daily. 09/20/15  Yes Ria Bush, MD  nitroGLYCERIN (NITROSTAT) 0.4 MG SL tablet Place 1 tablet (0.4 mg total) under the tongue every 5 (five) minutes as needed for chest pain (max 3 doses in 15 minutes). 05/18/14  Yes Minna Merritts, MD  simvastatin (ZOCOR) 10 MG tablet Take 1 tablet (10 mg total) by mouth at bedtime. 10/05/15  Yes Ria Bush, MD  valACYclovir (VALTREX) 500 MG tablet TAKE 1 TABLET DAILY 01/10/16  Yes Ria Bush, MD    Physical Exam: Vitals:   04/08/16 1715 04/08/16 1730 04/08/16 1745 04/08/16 1800  BP: 186/92 186/73 146/80 176/81  Pulse: 73 69  67  Resp: 15 15 22 16   Temp:      TempSrc:      SpO2: 99% 100%  100%    General: Alert, Awake and Oriented to Time, Place and Person. Appear in mild distress, affect appropriate Eyes: PERRL, Conjunctiva normal ENT: Oral Mucosa clear moist. Neck: no JVD, no Abnormal Mass Or lumps, no neck stiffness Cardiovascular: S1 and S2 Present, aortic systolic Murmur, Peripheral Pulses Present Respiratory: Bilateral Air entry equal and Decreased, no use of accessory muscle, Clear to Auscultation, no Crackles, no wheezes Abdomen: Bowel Sound present, Soft and no  tenderness Skin: no redness, no Rash, no induration Extremities: no Pedal edema, no calf tenderness Neurologic: Grossly no focal neuro deficit. Bilaterally Equal motor strength Motor strength bilateral equal strength 5/5,  Sensation present to light touch,  Reflexes present knee and biceps,  Cerebellar test normal finger nose finger. Gait not checked due to patient safety concerns.   Labs on Admission:  CBC:  Recent Labs Lab 04/08/16 1415 04/08/16 1437  WBC 6.2  --   NEUTROABS 4.6  --   HGB 14.8 14.6  HCT 42.5 43.0  MCV 89.5  --  PLT 128*  --    Basic Metabolic Panel:  Recent Labs Lab 04/08/16 1415 04/08/16 1437  NA 137 138  K 4.2 4.3  CL 103 102  CO2 26  --   GLUCOSE 124* 118*  BUN 16 18  CREATININE 1.36* 1.40*  CALCIUM 9.7  --    GFR: CrCl cannot be calculated (Unknown ideal weight.). Liver Function Tests:  Recent Labs Lab 04/08/16 1415  AST 25  ALT 21  ALKPHOS 106  BILITOT 0.7  PROT 6.9  ALBUMIN 4.1   No results for input(s): LIPASE, AMYLASE in the last 168 hours. No results for input(s): AMMONIA in the last 168 hours. Coagulation Profile:  Recent Labs Lab 04/08/16 1415  INR 1.07   Cardiac Enzymes: No results for input(s): CKTOTAL, CKMB, CKMBINDEX, TROPONINI in the last 168 hours. BNP (last 3 results) No results for input(s): PROBNP in the last 8760 hours. HbA1C: No results for input(s): HGBA1C in the last 72 hours. CBG: No results for input(s): GLUCAP in the last 168 hours. Lipid Profile: No results for input(s): CHOL, HDL, LDLCALC, TRIG, CHOLHDL, LDLDIRECT in the last 72 hours. Thyroid Function Tests: No results for input(s): TSH, T4TOTAL, FREET4, T3FREE, THYROIDAB in the last 72 hours. Anemia Panel: No results for input(s): VITAMINB12, FOLATE, FERRITIN, TIBC, IRON, RETICCTPCT in the last 72 hours. Urine analysis:    Component Value Date/Time   COLORURINE STRAW (A) 04/08/2016 1500   APPEARANCEUR CLEAR 04/08/2016 1500   LABSPEC  1.006 04/08/2016 1500   PHURINE 6.0 04/08/2016 1500   GLUCOSEU NEGATIVE 04/08/2016 1500   HGBUR SMALL (A) 04/08/2016 1500   HGBUR small 12/21/2008 0841   BILIRUBINUR NEGATIVE 04/08/2016 1500   BILIRUBINUR Negative 09/16/2014 1210   KETONESUR NEGATIVE 04/08/2016 1500   PROTEINUR NEGATIVE 04/08/2016 1500   UROBILINOGEN 0.2 09/16/2014 1210   UROBILINOGEN 0.2 12/21/2008 0841   NITRITE NEGATIVE 04/08/2016 1500   LEUKOCYTESUR NEGATIVE 04/08/2016 1500    Radiological Exams on Admission: Ct Head Wo Contrast  Result Date: 04/08/2016 CLINICAL DATA:  83 year old male with transient vision abnormality which began at noon today and lasted about 30 minutes. Initial encounter. TIA. Right ICA bulb stenosis. EXAM: CT HEAD WITHOUT CONTRAST TECHNIQUE: Contiguous axial images were obtained from the base of the skull through the vertex without intravenous contrast. COMPARISON:  Head CT and CTA head and neck 02/19/2016. Head CT 04/20/2013. FINDINGS: Brain: Chronic lacunar infarcts in the left greater than right deep gray matter nuclei re- demonstrated. Patchy and confluent bilateral cerebral white matter hypodensity appears stable. No cortically based acute infarct identified. No cortical encephalomalacia identified. No acute intracranial hemorrhage identified. No midline shift, mass effect, or evidence of intracranial mass lesion. No ventriculomegaly. Vascular: Calcified atherosclerosis at the skull base. No suspicious intracranial vascular hyperdensity. Skull: No acute osseous abnormality identified. Sinuses/Orbits: Visualized paranasal sinuses and mastoids are stable and well pneumatized. Other: Stable postoperative appearance of the globes and otherwise normal visible orbits soft tissues. Visualized scalp soft tissues are within normal limits. IMPRESSION: No acute intracranial abnormality. Stable non contrast CT appearance of the brain with chronic small vessel ischemia. Electronically Signed   By: Genevie Ann M.D.   On:  04/08/2016 16:01   EKG: Independently reviewed. normal sinus rhythm, nonspecific ST and T waves changes.  Assessment/Plan 1. TIA (transient ischemic attack) Admitted to telemetry. Appreciate neurology input. Recommendation is full neuro workup. We will get MRI/MRA brain, carotid Doppler, echocardiogram. Check lipid profile. Hemoglobin A1c. PTOT and speech evaluation And patient actually has passed  a swallowing test. We will advance to heart healthy diet. Monitor on telemetry.  2. Accelerated hypertension. Next and blood pressure currently elevated. We will allow permissive hypertension. Next and holding lisinopril.  3. Neck pain. Likely muscular skeletal in nature. We will use when necessary Robaxin. Continue when necessary Tylenol.  4. Chronic kidney disease stage III. A relatively stable. Avoid nephrotoxic medication. Question the use of lisinopril as an outpatient.  5. Thrombocytopenia patient has chronic, thrombocytopenia chronically on aspirin and Plavix. Monitor.  Nutrition: Cardiac diet DVT Prophylaxis: subcutaneous Heparin  Advance goals of care discussion: full code   Consults: neurolgoy  Family Communication: family was present at bedside, at the time of interview.  Opportunity was given to ask question and all questions were answered satisfactorily.  Disposition: Admitted as observation, telemetry unit. Likely to be discharged home, in 2 days.  Author: Berle Mull, MD [facility] Pager: [phone] 04/08/2016  If 7PM-7AM, please contact night-coverage [email or web address] Password [email or web address]

## 2016-04-08 NOTE — Consult Note (Signed)
NEURO HOSPITALIST CONSULT NOTE   Requestig physician: Dr. Ellender Hose   Reason for Consult:TIA   History obtained from:  Patient    HPI:                                                                                                                                          Jimmy [name] is an 83 yo male with hx of HTN and CLL ( in remission) and CABG who is on DAPT presents after developing tunnel vision and expressive aphasia that lasted for 40-49min today. Currently back to baseline. States he has this same vision issue back in Nov and had a TIA workup at an outside hospital. SBP in the ER was 201/98 and states he took his BP meds this am.  Past Medical History:  Diagnosis Date  . CAD (coronary artery disease)    a. 04/2001 S/P CABGx 4;  b. 2008 MV:  EF 63% normal perfusion.  . Carotid stenosis    a. 11/2012 Carotid U/S:  RICA [id number], LICA [id number].  . Chronic kidney disease, stage 3   . CLL (chronic lymphoblastic leukemia) 2009   Stage IV; Dr. Ivor Messier - referred to Dr. Lissa Merlin [facility] 07/2013 now on Rituxan (10/2013) --> stage 0 (09/2014)  . Diastolic CHF ([place])    a. [id number] Echo: EF 55-65%, mild conc LVH, Gr 1 DD, mild AS, triv AI, mildly dil Ao root (3.5 cm).  . GERD (gastroesophageal reflux disease) 1990s  . History of CVA (cerebrovascular accident) 2015   by MRI - remote L internal capsule  . History of herpes genitalis    valtrex daily  . Hyperlipemia 2002  . Hypertension 2002  . Mass of submandibular region 2015   referred to gen surg after chemo  . Mild aortic stenosis    a. 07/2011 Echo: Mild AS, triv AI.  [name] Thrombocytopenia ([place])    outpatient goals Hgb >9, plt >20    Past Surgical History:  Procedure Laterality Date  . ANGIOPLASTY  1998  . CATARACT EXTRACTION, BILATERAL     R 1/09, L 8/09  . COLONOSCOPY  11/29/1987   Normal  . COLONOSCOPY  02/07/2003   Hemm. Internal, focal proctitis, negative biopsy  . COLONOSCOPY  12/12/2004   Internal  external hemorrhoids, +proctits, negative biopsy  . CORONARY ANGIOPLASTY  1998  . CORONARY ARTERY BYPASS GRAFT  04/23/2001   x4, Dr. Pia Mau  . ESOPHAGOGASTRODUODENOSCOPY  11/29/1987   Gastritis  . ETT  12/10/2006   Persantine myoview nml  . HEMORROIDECTOMY  1954   Fissure repair, [place]  . HEPATIC ARTERY ANGIOPLASTY  1954   [place], [place]  . KIDNEY STONE SURGERY  1977   Dr Redmond Baseman  . LITHOTRIPSY  1990s   Multiple  . US ECHOCARDIOGRAPHY  07/2011   nl sys fxn, EF 55-60%, grade 1 diast dysfunction, mild AS, mildly elevated PA pressure    Family History  Problem Relation Age of Onset  . Hypertension Mother   . Heart disease Mother   . Hyperlipidemia Mother   . Hypertension Father   . Hyperlipidemia Father   . Kidney cancer Sister     Renal cell cancer  . Alcohol abuse Brother   . Diabetes Brother   . Stroke Brother   . Heart attack Brother     MI  . Diabetes Other     Sister's daughter  . Depression Daughter     Bipolar    Family History:   Social History:  reports that he quit smoking about 37 years ago. He has a 60.00 pack-year smoking history. He has never used smokeless tobacco. He reports that he does not drink alcohol or use drugs.  Allergies  Allergen Reactions  . New Skin   . Tape Dermatitis    MEDICATIONS:                                                                                                                     I have reviewed the patient's current medications.   ROS:                                                                                                                                       History obtained from the patient  General ROS: negative for - chills, fatigue, fever, night sweats, weight gain or weight loss Psychological ROS: negative for - behavioral disorder, hallucinations, memory difficulties, mood swings or suicidal ideation Ophthalmic ROS: negative for - blurry vision, double vision, eye pain or loss of vision ENT ROS:  negative for - epistaxis, nasal discharge, oral lesions, sore throat, tinnitus or vertigo Allergy and Immunology ROS: negative for - hives or itchy/watery eyes Hematological and Lymphatic ROS: negative for - bleeding problems, bruising or swollen lymph nodes Endocrine ROS: negative for - galactorrhea, hair pattern changes, polydipsia/polyuria or temperature intolerance Respiratory ROS: negative for - cough, hemoptysis, shortness of breath or wheezing Cardiovascular ROS: negative for - chest pain, dyspnea on exertion, edema or irregular heartbeat Gastrointestinal ROS: negative for - abdominal pain, diarrhea, hematemesis, nausea/vomiting or stool incontinence Genito-Urinary ROS: negative for - dysuria, hematuria, incontinence or urinary frequency/urgency Musculoskeletal ROS: negative for - joint swelling or muscular weakness Neurological ROS:  as noted in HPI Dermatological ROS: negative for rash and skin lesion changes   Blood pressure 193/89, pulse 65, temperature 98.2 F (36.8 C), resp. rate 16, SpO2 100 %.   Neurologic Examination:                                                                                                      HEENT-  Normocephalic, no lesions, without obvious abnormality.  Normal external eye and conjunctiva.  Normal TM's bilaterally.  Normal auditory canals and external ears. Normal external nose, mucus membranes and septum.  Normal pharynx. Cardiovascular- regular rate and rhythm, S1, S2 normal, no murmur, click, rub or gallop, pulses palpable throughout   Lungs- chest clear, no wheezing, rales, normal symmetric air entry, Heart exam - S1, S2 normal, no murmur, no gallop, rate regular Abdomen- soft, non-tender; bowel sounds normal; no masses,  no organomegaly  Neurological Examination Mental Status: Alert, oriented, thought content appropriate.  Speech fluent without evidence of aphasia.  Able to follow 3 step commands without difficulty. Cranial Nerves: II: Discs  flat bilaterally; Visual fields grossly normal, pupils equal, round, reactive to light and accommodation III,IV, VI: ptosis not present, extra-ocular motions intact bilaterally V,VII: smile symmetric, facial light touch sensation normal bilaterally VIII: hearing normal bilaterally IX,X: uvula rises symmetrically XI: bilateral shoulder shrug XII: midline tongue extension Motor: Right : Upper extremity   5/5    Left:     Upper extremity   5/5  Lower extremity   5/5     Lower extremity   5/5 Tone and bulk:normal tone throughout; no atrophy noted Sensory: Pinprick and light touch intact throughout, bilaterally  Cerebellar: normal finger-to-nose     Lab Results: Basic Metabolic Panel:  Recent Labs Lab 04/08/16 1415 04/08/16 1437  NA 137 138  K 4.2 4.3  CL 103 102  CO2 26  --   GLUCOSE 124* 118*  BUN 16 18  CREATININE 1.36* 1.40*  CALCIUM 9.7  --     Liver Function Tests:  Recent Labs Lab 04/08/16 1415  AST 25  ALT 21  ALKPHOS 106  BILITOT 0.7  PROT 6.9  ALBUMIN 4.1   No results for input(s): LIPASE, AMYLASE in the last 168 hours. No results for input(s): AMMONIA in the last 168 hours.  CBC:  Recent Labs Lab 04/08/16 1415 04/08/16 1437  WBC 6.2  --   NEUTROABS 4.6  --   HGB 14.8 14.6  HCT 42.5 43.0  MCV 89.5  --   PLT 128*  --     Cardiac Enzymes: No results for input(s): CKTOTAL, CKMB, CKMBINDEX, TROPONINI in the last 168 hours.  Lipid Panel: No results for input(s): CHOL, TRIG, HDL, CHOLHDL, VLDL, LDLCALC in the last 168 hours.  CBG: No results for input(s): GLUCAP in the last 168 hours.  Microbiology: Results for orders placed or performed in visit on 08/17/13  TECHNOLOGIST REVIEW     Status: None   Collection Time: 08/17/13 12:01 PM  Result Value Ref Range Status   Technologist Review Variant lymphs present, Few Smudge cells  Final  Coagulation Studies:  Recent Labs  04/08/16 1415  LABPROT 14.0  INR 1.07    Imaging: Ct Head  Wo Contrast  Result Date: 04/08/2016 CLINICAL DATA:  83 year old male with transient vision abnormality which began at noon today and lasted about 30 minutes. Initial encounter. TIA. Right ICA bulb stenosis. EXAM: CT HEAD WITHOUT CONTRAST TECHNIQUE: Contiguous axial images were obtained from the base of the skull through the vertex without intravenous contrast. COMPARISON:  Head CT and CTA head and neck 02/19/2016. Head CT 04/20/2013. FINDINGS: Brain: Chronic lacunar infarcts in the left greater than right deep gray matter nuclei re- demonstrated. Patchy and confluent bilateral cerebral white matter hypodensity appears stable. No cortically based acute infarct identified. No cortical encephalomalacia identified. No acute intracranial hemorrhage identified. No midline shift, mass effect, or evidence of intracranial mass lesion. No ventriculomegaly. Vascular: Calcified atherosclerosis at the skull base. No suspicious intracranial vascular hyperdensity. Skull: No acute osseous abnormality identified. Sinuses/Orbits: Visualized paranasal sinuses and mastoids are stable and well pneumatized. Other: Stable postoperative appearance of the globes and otherwise normal visible orbits soft tissues. Visualized scalp soft tissues are within normal limits. IMPRESSION: No acute intracranial abnormality. Stable non contrast CT appearance of the brain with chronic small vessel ischemia. Electronically Signed   By: Genevie Ann M.D.   On: 04/08/2016 16:01        Assessment/Plan:  83 yo male with hx of HTN and CLL ( in remission) and CABG who is on DAPT presents after developing tunnel vision and expressive aphasia that lasted for 40-7min today. Currently back to baseline. States he has this same vision issue back in Nov and had a TIA workup at an outside hospital. SBP in the ER was 201/98 and states he took his BP meds this am.  Recommend:  1. HgbA1c, fasting lipid panel 2. MRI, MRA  of the brain without contrast 3. PT  consult, OT consult, Speech consult 4. Echocardiogram 5. Carotid dopplers 6. Prophylactic therapy-ASA 81mg  and plavix 75mg  7. Risk factor modification 8. Telemetry monitoring 9. Frequent neuro checks 10 NPO until passes stroke swallow screen 11 please page stroke NP  Or  PA  Or MD from 8am -4 pm  as this patient from this time will be  followed by the stroke.   You can look them up on [email or web address]  Password [email or web address] 12. Routine EEG

## 2016-04-08 NOTE — ED Triage Notes (Signed)
Pt presents to the ed with symptoms of left sided numbness, weakness and aphasia that started at 1200, the patient presents and all symptoms have gone away, NIH of 0, edp at bedside, patient is alert oriented and ambulatory. Patient denies any pain

## 2016-04-08 NOTE — ED Provider Notes (Signed)
[facility] DEPT Provider Note   CSN: [id number] Arrival date & time: 04/08/16  1403     History   Chief Complaint Chief Complaint  Patient presents with  . Transient Ischemic Attack    HPI Jimmy [name] is a 83 y.o. male.  HPI  83 yo M with PMHx as below including CAD, carotid stenosis, here with transient neurological deficits. Pt was in his usual state of health until approx noon today, when he developed acute onset of left arm numbness, weakness, and aphasia. Pt wife immediately called EMS. On EMS arrival, pt noted to have expressive aphasia and L arm weakness. En route, pt became increasingly more understandable and on arrival here, he has no complaints. He states his speech difficulty, arm weakness/numbness, and other sx have all completely resolved and he is now back to baseline. He has a mild HA. He has a h/o TIA in [place] and is on ASA/Plavix, which he took today and has not missed any doses.   Past Medical History:  Diagnosis Date  . CAD (coronary artery disease)    a. 04/2001 S/P CABGx 4;  b. 2008 MV:  EF 63% normal perfusion.  . Carotid stenosis    a. 11/2012 Carotid U/S:  RICA [id number], LICA [id number].  . Chronic kidney disease, stage 3   . CLL (chronic lymphoblastic leukemia) 2009   Stage IV; Dr. Ivor Messier - referred to Dr. Lissa Merlin [facility] 07/2013 now on Rituxan (10/2013) --> stage 0 (09/2014)  . Diastolic CHF ([place])    a. [id number] Echo: EF 55-65%, mild conc LVH, Gr 1 DD, mild AS, triv AI, mildly dil Ao root (3.5 cm).  . GERD (gastroesophageal reflux disease) 1990s  . History of CVA (cerebrovascular accident) 2015   by MRI - remote L internal capsule  . History of herpes genitalis    valtrex daily  . Hyperlipemia 2002  . Hypertension 2002  . Mass of submandibular region 2015   referred to gen surg after chemo  . Mild aortic stenosis    a. 07/2011 Echo: Mild AS, triv AI.  [name] Thrombocytopenia ([place])    outpatient goals Hgb >9, plt >20    Patient Active Problem List   Diagnosis Date Noted  . Leg weakness 11/11/2014  . Advanced care planning/counseling discussion 09/16/2014  . Nocturia 09/16/2014  . Mass of submandibular region   . History of CVA (cerebrovascular accident)   . Chest pressure 11/19/2013  . S/P CABG (coronary artery bypass graft) 11/19/2013  . Chronic kidney disease, stage 3   . Aortic stenosis   . Medicare annual wellness visit, subsequent 08/27/2012  . Vitamin D deficiency 08/17/2012  . Dyspnea 06/26/2011  . Erectile dysfunction 09/18/2010  . Carotid artery stenosis 12/01/2009  . UNSPECIFIED SUBJECTIVE VISUAL DISTURBANCE 05/01/2009  . BACK PAIN, LUMBAR 10/24/2008  . Chronic lymphocytic leukemia, Rai stage 0 ([place]) 04/02/2007    Class: Chronic  . DERMATITIS, SEBORRHEIC NOS 12/16/2006  . GENITAL HERPES 12/04/2006  . Hyperlipidemia 12/04/2006  . Essential hypertension 12/04/2006  . Coronary atherosclerosis 12/04/2006  . GERD 12/04/2006  . Prediabetes 12/04/2006  . RENAL CALCULUS, HX OF 12/04/2006    Past Surgical History:  Procedure Laterality Date  . ANGIOPLASTY  1998  . CATARACT EXTRACTION, BILATERAL     R 1/09, L 8/09  . COLONOSCOPY  11/29/1987   Normal  . COLONOSCOPY  02/07/2003   Hemm. Internal, focal proctitis, negative biopsy  . COLONOSCOPY  12/12/2004   Internal external hemorrhoids, +proctits, negative biopsy  .  CORONARY ANGIOPLASTY  1998  . CORONARY ARTERY BYPASS GRAFT  04/23/2001   x4, Dr. Pia Mau  . ESOPHAGOGASTRODUODENOSCOPY  11/29/1987   Gastritis  . ETT  12/10/2006   Persantine myoview nml  . HEMORROIDECTOMY  1954   Fissure repair, [place]  . HEPATIC ARTERY ANGIOPLASTY  1954   [place], [place]  . KIDNEY STONE SURGERY  1977   Dr Redmond Baseman  . LITHOTRIPSY  1990s   Multiple  . US ECHOCARDIOGRAPHY  07/2011   nl sys fxn, EF 55-60%, grade 1 diast dysfunction, mild AS, mildly elevated PA pressure       Home Medications    Prior to Admission medications   Medication Sig Start Date End Date Taking?  Authorizing Provider  allopurinol (ZYLOPRIM) 300 MG tablet Take 1 tablet (300 mg total) by mouth daily. 11/09/15   Ria Bush, MD  aspirin EC 81 MG tablet Take 1 tablet (81 mg total) by mouth daily. 05/18/14   Minna Merritts, MD  cholecalciferol (VITAMIN D) 1000 units tablet Take 2,000 Units by mouth daily.    Historical Provider, MD  clopidogrel (PLAVIX) 75 MG tablet Take 1 tablet (75 mg total) by mouth daily. 03/21/16 03/21/17  Ria Bush, MD  [facility] MELATONIN PO Take 1 tablet by mouth at bedtime.    Historical Provider, MD  lisinopril (PRINIVIL,ZESTRIL) 10 MG tablet Take 1 tablet (10 mg total) by mouth daily. 09/20/15   Ria Bush, MD  nitroGLYCERIN (NITROSTAT) 0.4 MG SL tablet Place 1 tablet (0.4 mg total) under the tongue every 5 (five) minutes as needed for chest pain (max 3 doses in 15 minutes). 05/18/14   Minna Merritts, MD  simvastatin (ZOCOR) 10 MG tablet Take 1 tablet (10 mg total) by mouth at bedtime. 10/05/15   Ria Bush, MD  valACYclovir (VALTREX) 500 MG tablet TAKE 1 TABLET DAILY 01/10/16   Ria Bush, MD    Family History Family History  Problem Relation Age of Onset  . Hypertension Mother   . Heart disease Mother   . Hyperlipidemia Mother   . Hypertension Father   . Hyperlipidemia Father   . Kidney cancer Sister     Renal cell cancer  . Alcohol abuse Brother   . Diabetes Brother   . Stroke Brother   . Heart attack Brother     MI  . Diabetes Other     Sister's daughter  . Depression Daughter     Bipolar    Social History Social History  Substance Use Topics  . Smoking status: Former Smoker    Packs/day: 2.00    Years: 30.00    Quit date: 02/28/1979  . Smokeless tobacco: Never Used     Comment: quit 1980  . Alcohol use No     Comment: occasional wine     Allergies   New skin and Tape   Review of Systems Review of Systems  Constitutional: Negative for chills, fatigue and fever.  HENT: Negative for congestion and rhinorrhea.    Eyes: Negative for visual disturbance.  Respiratory: Negative for cough, shortness of breath and wheezing.   Cardiovascular: Negative for chest pain and leg swelling.  Gastrointestinal: Negative for abdominal pain, diarrhea, nausea and vomiting.  Genitourinary: Negative for dysuria and flank pain.  Musculoskeletal: Negative for neck pain and neck stiffness.  Skin: Negative for rash and wound.  Allergic/Immunologic: Negative for immunocompromised state.  Neurological: Negative for syncope, weakness and headaches.  All other systems reviewed and are negative.    Physical Exam  Updated Vital Signs BP 179/79   Pulse 69   Temp 98.2 F (36.8 C)   Resp 18   SpO2 98%   Physical Exam  Constitutional: He is oriented to person, place, and time. He appears well-developed and well-nourished. No distress.  HENT:  Head: Normocephalic and atraumatic.  Eyes: Conjunctivae are normal.  Neck: Neck supple.  Cardiovascular: Normal rate, regular rhythm and normal heart sounds.  Exam reveals no friction rub.   No murmur heard. Pulmonary/Chest: Effort normal and breath sounds normal. No respiratory distress. He has no wheezes. He has no rales.  Abdominal: He exhibits no distension.  Musculoskeletal: He exhibits no edema.  Neurological: He is alert and oriented to person, place, and time. He exhibits normal muscle tone.  Skin: Skin is warm. Capillary refill takes less than 2 seconds.  Psychiatric: He has a normal mood and affect.  Nursing note and vitals reviewed.   Neurological Exam:  Mental Status: Alert and oriented to person, place, and time. Attention and concentration normal. Speech clear. Recent memory is intact. Cranial Nerves: Visual fields grossly intact. EOMI and PERRLA. No nystagmus noted. Facial sensation intact at forehead, maxillary cheek, and chin/mandible bilaterally. No facial asymmetry or weakness. Hearing grossly normal. Uvula is midline, and palate elevates symmetrically. Normal  SCM and trapezius strength. Tongue midline without fasciculations. Motor: Muscle strength 5/5 in proximal and distal UE and LE bilaterally. No pronator drift. Muscle tone normal. Reflexes: 2+ and symmetrical in all four extremities.  Sensation: Intact to light touch in upper and lower extremities distally bilaterally.  Gait: Normal without ataxia. Coordination: Normal FTN bilaterally.    ED Treatments / Results  Labs (all labs ordered are listed, but only abnormal results are displayed) Labs Reviewed  CBC - Abnormal; Notable for the following:       Result Value   Platelets 128 (*)    All other components within normal limits  COMPREHENSIVE METABOLIC PANEL - Abnormal; Notable for the following:    Glucose, Bld 124 (*)    Creatinine, Ser 1.36 (*)    GFR calc non Af Amer 46 (*)    GFR calc Af Amer 53 (*)    All other components within normal limits  URINALYSIS, ROUTINE W REFLEX MICROSCOPIC - Abnormal; Notable for the following:    Color, Urine STRAW (*)    Hgb urine dipstick SMALL (*)    All other components within normal limits  I-STAT CHEM 8, ED - Abnormal; Notable for the following:    Creatinine, Ser 1.40 (*)    Glucose, Bld 118 (*)    All other components within normal limits  ETHANOL  PROTIME-INR  APTT  DIFFERENTIAL  RAPID URINE DRUG SCREEN, HOSP PERFORMED  I-STAT TROPOININ, ED    EKG  EKG Interpretation None       Radiology No results found.  Procedures Procedures (including critical care time)  Medications Ordered in ED Medications - No data to display   Initial Impression / Assessment and Plan / ED Course  I have reviewed the triage vital signs and the nursing notes.  Pertinent labs & imaging results that were available during my care of the patient were reviewed by me and considered in my medical decision making (see chart for details).  Clinical Course     83 yo M with PMHx as above here with transient expressive aphasia, LUE weakness/numbness.  Sx now resolved completely on arrival, c/w TIA. CT head neg. Labs unremarkable. D/w Neuro, will admit for TIA work-up,  tele.  Final Clinical Impressions(s) / ED Diagnoses   Final diagnoses:  Transient cerebral ischemia, unspecified type    New Prescriptions New Prescriptions   No medications on file     Duffy Bruce, MD 04/09/16 1220

## 2016-04-08 NOTE — Progress Notes (Signed)
Patient arrived on the floor, reports headache, states that he had a TIA in November. Will continue to monitor.

## 2016-04-09 ENCOUNTER — Observation Stay (HOSPITAL_BASED_OUTPATIENT_CLINIC_OR_DEPARTMENT_OTHER): Payer: Medicare Other

## 2016-04-09 ENCOUNTER — Encounter (HOSPITAL_COMMUNITY): Payer: Self-pay | Admitting: *Deleted

## 2016-04-09 ENCOUNTER — Observation Stay (HOSPITAL_COMMUNITY): Payer: Medicare Other

## 2016-04-09 DIAGNOSIS — G458 Other transient cerebral ischemic attacks and related syndromes: Secondary | ICD-10-CM

## 2016-04-09 DIAGNOSIS — G459 Transient cerebral ischemic attack, unspecified: Secondary | ICD-10-CM | POA: Diagnosis not present

## 2016-04-09 DIAGNOSIS — R200 Anesthesia of skin: Secondary | ICD-10-CM | POA: Diagnosis not present

## 2016-04-09 LAB — COMPREHENSIVE METABOLIC PANEL
ALT: 18 U/L (ref 17–63)
AST: 19 U/L (ref 15–41)
Albumin: 3.6 g/dL (ref 3.5–5.0)
Alkaline Phosphatase: 89 U/L (ref 38–126)
Anion gap: 7 (ref 5–15)
BUN: 13 mg/dL (ref 6–20)
CO2: 26 mmol/L (ref 22–32)
Calcium: 9.5 mg/dL (ref 8.9–10.3)
Chloride: 103 mmol/L (ref 101–111)
Creatinine, Ser: 1.33 mg/dL — ABNORMAL HIGH (ref 0.61–1.24)
GFR calc Af Amer: 55 mL/min — ABNORMAL LOW (ref >60)
GFR calc non Af Amer: 47 mL/min — ABNORMAL LOW (ref >60)
Glucose, Bld: 100 mg/dL — ABNORMAL HIGH (ref 65–99)
Potassium: 4.1 mmol/L (ref 3.5–5.1)
Sodium: 136 mmol/L (ref 135–145)
Total Bilirubin: 1 mg/dL (ref 0.3–1.2)
Total Protein: 6.1 g/dL — ABNORMAL LOW (ref 6.5–8.1)

## 2016-04-09 LAB — CBC WITH DIFFERENTIAL/PLATELET
Basophils Absolute: 0 10*3/uL (ref 0.0–0.1)
Basophils Relative: 0 %
Eosinophils Absolute: 0.2 10*3/uL (ref 0.0–0.7)
Eosinophils Relative: 4 %
HCT: 37.9 % — ABNORMAL LOW (ref 39.0–52.0)
Hemoglobin: 13.6 g/dL (ref 13.0–17.0)
Lymphocytes Relative: 18 %
Lymphs Abs: 1 10*3/uL (ref 0.7–4.0)
MCH: 31.9 pg (ref 26.0–34.0)
MCHC: 35.9 g/dL (ref 30.0–36.0)
MCV: 89 fL (ref 78.0–100.0)
Monocytes Absolute: 0.6 10*3/uL (ref 0.1–1.0)
Monocytes Relative: 11 %
Neutro Abs: 3.8 10*3/uL (ref 1.7–7.7)
Neutrophils Relative %: 67 %
Platelets: 122 10*3/uL — ABNORMAL LOW (ref 150–400)
RBC: 4.26 MIL/uL (ref 4.22–5.81)
RDW: 13.3 % (ref 11.5–15.5)
WBC: 5.6 10*3/uL (ref 4.0–10.5)

## 2016-04-09 LAB — LIPID PANEL
Cholesterol: 109 mg/dL (ref 0–200)
HDL: 31 mg/dL — ABNORMAL LOW (ref >40)
LDL Cholesterol: 46 mg/dL (ref 0–99)
Total CHOL/HDL Ratio: 3.5 RATIO
Triglycerides: 160 mg/dL — ABNORMAL HIGH (ref <150)
VLDL: 32 mg/dL (ref 0–40)

## 2016-04-09 LAB — ECHOCARDIOGRAM COMPLETE

## 2016-04-09 MED ORDER — ASPIRIN-DIPYRIDAMOLE ER 25-200 MG PO CP12: 1.0000 | ORAL_CAPSULE | Freq: Two times a day (BID) | ORAL | 0 refills | Status: DC

## 2016-04-09 MED ORDER — ASPIRIN-DIPYRIDAMOLE ER 25-200 MG PO CP12: 1.0000 | ORAL_CAPSULE | Freq: Every day | ORAL | 0 refills | Status: AC

## 2016-04-09 MED ORDER — ASPIRIN EC 81 MG PO TBEC: 81.0000 mg | DELAYED_RELEASE_TABLET | Freq: Every day | ORAL | 0 refills | Status: AC

## 2016-04-09 MED ORDER — ASPIRIN EC 81 MG PO TBEC: 81.0000 mg | DELAYED_RELEASE_TABLET | Freq: Every day | ORAL | Status: DC

## 2016-04-09 MED ORDER — ASPIRIN-DIPYRIDAMOLE ER 25-200 MG PO CP12: 1.0000 | ORAL_CAPSULE | Freq: Two times a day (BID) | ORAL | Status: DC

## 2016-04-09 MED ORDER — ASPIRIN-DIPYRIDAMOLE ER 25-200 MG PO CP12
1.0000 | ORAL_CAPSULE | Freq: Every day | ORAL | Status: DC
  Administered 2016-04-09: 1 via ORAL
  Filled 2016-04-09: qty 1

## 2016-04-09 NOTE — Evaluation (Signed)
Occupational Therapy Evaluation/Discharge Patient Details Name: Jimmy [name] MRN: [id number] DOB: 02-20-1929 Today's Date: 04/09/2016    History of Present Illness Pt is an 83 y.o. male who experienced tunnel vision and L sided weakness and numbness which he reports resolved after approximately 45 minutes. CT unremarkable and MRI revealed no acute ischemia, chronic microvascular ischemia but old L basal ganglia lacunar infarct. PMH: hypertension, CLL, CABG, and carotid artery stenosis.    Clinical Impression   PTA, pt was independent with ADL and functional mobility and was maintaining an active lifestyle. He reports working out at [employer] each morning. Pt reports that yesterday when he attempted to pick up his dog he noted L UE weakness, sensory loss, and tunnel vision that lasted for approximately 45 minutes. Per pt report these have resolved. On evaluation, pt independent with all ADL, ADL transfers, and functional mobility. Discussed safety post-acute D/C and pt demonstrates understanding. LUE sensation, strength, and coordination in tact. No visual deficits noted during functional activities and with testing. He lives with his wife who is available for intermittent supervision if needed at home. No further OT needs identified. OT will sign off acutely.    Follow Up Recommendations  Supervision - Intermittent    Equipment Recommendations  None recommended by OT       Precautions / Restrictions Precautions Precautions: None Restrictions Weight Bearing Restrictions: No      Mobility Bed Mobility               General bed mobility comments: OOB in chair on OT arrival  Transfers Overall transfer level: Independent Equipment used: None             General transfer comment: Pt safe and independent with all transfers.    Balance Overall balance assessment: No apparent balance deficits (not formally assessed)                                           ADL Overall ADL's : Independent                                       General ADL Comments: Pt functioning at baseline and independent with all ADL performance on OT evaluation.     Vision Vision Assessment?: Yes Eye Alignment: Within Functional Limits Ocular Range of Motion: Within Functional Limits Alignment/Gaze Preference: Within Defined Limits Tracking/Visual Pursuits: Able to track stimulus in all quads without difficulty Saccades: Within functional limits Visual Fields: No apparent deficits Additional Comments: Pt reporting tunnel vision is resolved. No deficits noted during functional tasks or testing. Able to identify items in periphery appropriately.   Perception     Praxis Praxis Praxis tested?: Within functional limits    Pertinent Vitals/Pain Pain Assessment: No/denies pain     Hand Dominance Right   Extremity/Trunk Assessment Upper Extremity Assessment Upper Extremity Assessment: Overall WFL for tasks assessed   Lower Extremity Assessment Lower Extremity Assessment: Overall WFL for tasks assessed       Communication Communication Communication: No difficulties   Cognition Arousal/Alertness: Awake/alert Behavior During Therapy: WFL for tasks assessed/performed Overall Cognitive Status: Within Functional Limits for tasks assessed                     General Comments  Exercises       Shoulder Instructions      Home Living Family/patient expects to be discharged to:: Private residence Living Arrangements: Spouse/significant other Available Help at Discharge: Family;Available PRN/intermittently Type of Home: House Home Access: Stairs to enter [employer] of Steps: 4 in front and in garage; ramp on back door Entrance Stairs-Rails: Right;Left (in garage only on L) Home Layout: One level     Bathroom Shower/Tub: Occupational psychologist: Standard     Home Equipment: Clinical cytogeneticist - 2  wheels;Hand held shower head          Prior Functioning/Environment Level of Independence: Independent                 OT Problem List:     OT Treatment/Interventions:      OT Goals(Current goals can be found in the care plan section) Acute Rehab OT Goals Patient Stated Goal: to go home today OT Goal Formulation: With patient Time For Goal Achievement: 04/16/16 Potential to Achieve Goals: Good  OT Frequency:     Barriers to D/C:            Co-evaluation              End of Session Nurse Communication: Mobility status  Activity Tolerance: Patient tolerated treatment well Patient left: in chair;with call bell/phone within reach;with chair alarm set   Time: [id number] OT Time Calculation (min): 15 min Charges:  OT General Charges $OT Visit: 1 Procedure OT Evaluation $OT Eval Low Complexity: 1 Procedure  Norman Herrlich, OTR/L [phone] 04/09/2016, 9:24 AM

## 2016-04-09 NOTE — Progress Notes (Signed)
Patient discharged from [facility]M. IV's removed. Discharge education given. Patient ambulated to car with his wife.

## 2016-04-09 NOTE — Care Management Note (Signed)
Case Management Note  Patient Details  Name: RACHID HLAVATY MRN: [id number] Date of Birth: 05-03-1928  Subjective/Objective:        Patient presents with visual changes. Lives at home with spouse. CM will follow for discharge needs pending PT/OT evals and physician orders.             Action/Plan:   Expected Discharge Date:                  Expected Discharge Plan:     In-House Referral:     Discharge planning Services     Post Acute Care Choice:    Choice offered to:     DME Arranged:    DME Agency:     HH Arranged:    HH Agency:     Status of Service:     If discussed at [employer] of Stay Meetings, dates discussed:    Additional Comments:  Rolm Baptise, RN 04/09/2016, 10:25 AM

## 2016-04-09 NOTE — Progress Notes (Addendum)
STROKE TEAM PROGRESS NOTE   HISTORY OF PRESENT ILLNESS (per record) TAREL WANDELL is an 83 yo male with hx of HTN and CLL ( in remission) and CABG who is on DAPT presents after developing tunnel vision and expressive aphasia that lasted for 40-23min today 04/08/2016. Currently back to baseline. States he has this same vision issue back in Nov and had a TIA workup at an outside hospital. SBP in the ER was 201/98 and states he took his BP meds this am. Patient was not administered IV t-PA secondary to symptoms resolution. He was admitted for further evaluation and treatment.   SUBJECTIVE (INTERVAL HISTORY) His family is at the bedside.  Overall he feels his condition is completely resolved. He recounted his HPI and recent hospitalization and workup for TIA. He's had recurrent TIAs in the past on Plavix and now on aspirin and Plavix   OBJECTIVE Temp:  [97.3 F (36.3 C)-98.2 F (36.8 C)] 97.6 F (36.4 C) (01/09 0830) Pulse Rate:  [64-80] 74 (01/09 0830) Cardiac Rhythm: Normal sinus rhythm (01/09 0703) Resp:  [15-22] 20 (01/09 0830) BP: (108-201)/(46-92) 108/52 (01/09 0830) SpO2:  [91 %-100 %] 96 % (01/09 0830)  CBC:  Recent Labs Lab 04/08/16 1415 04/08/16 1437 04/09/16 0535  WBC 6.2  --  5.6  NEUTROABS 4.6  --  3.8  HGB 14.8 14.6 13.6  HCT 42.5 43.0 37.9*  MCV 89.5  --  89.0  PLT 128*  --  122*    Basic Metabolic Panel:  Recent Labs Lab 04/08/16 1415 04/08/16 1437 04/09/16 0535  NA 137 138 136  K 4.2 4.3 4.1  CL 103 102 103  CO2 26  --  26  GLUCOSE 124* 118* 100*  BUN 16 18 13   CREATININE 1.36* 1.40* 1.33*  CALCIUM 9.7  --  9.5    Lipid Panel:    Component Value Date/Time   CHOL 109 04/09/2016 0535   TRIG 160 (H) 04/09/2016 0535   HDL 31 (L) 04/09/2016 0535   CHOLHDL 3.5 04/09/2016 0535   VLDL 32 04/09/2016 0535   LDLCALC 46 04/09/2016 0535   HgbA1c:  Lab Results  Component Value Date   HGBA1C 5.7 09/15/2015   Urine Drug Screen:    Component Value  Date/Time   LABOPIA NONE DETECTED 04/08/2016 1500   COCAINSCRNUR NONE DETECTED 04/08/2016 1500   LABBENZ NONE DETECTED 04/08/2016 1500   AMPHETMU NONE DETECTED 04/08/2016 1500   THCU NONE DETECTED 04/08/2016 1500   LABBARB NONE DETECTED 04/08/2016 1500      IMAGING  Ct Head Wo Contrast  Result Date: 04/08/2016 CLINICAL DATA:  83 year old male with transient vision abnormality which began at noon today and lasted about 30 minutes. Initial encounter. TIA. Right ICA bulb stenosis. EXAM: CT HEAD WITHOUT CONTRAST TECHNIQUE: Contiguous axial images were obtained from the base of the skull through the vertex without intravenous contrast. COMPARISON:  Head CT and CTA head and neck 02/19/2016. Head CT 04/20/2013. FINDINGS: Brain: Chronic lacunar infarcts in the left greater than right deep gray matter nuclei re- demonstrated. Patchy and confluent bilateral cerebral white matter hypodensity appears stable. No cortically based acute infarct identified. No cortical encephalomalacia identified. No acute intracranial hemorrhage identified. No midline shift, mass effect, or evidence of intracranial mass lesion. No ventriculomegaly. Vascular: Calcified atherosclerosis at the skull base. No suspicious intracranial vascular hyperdensity. Skull: No acute osseous abnormality identified. Sinuses/Orbits: Visualized paranasal sinuses and mastoids are stable and well pneumatized. Other: Stable postoperative appearance of the globes and  otherwise normal visible orbits soft tissues. Visualized scalp soft tissues are within normal limits. IMPRESSION: No acute intracranial abnormality. Stable non contrast CT appearance of the brain with chronic small vessel ischemia. Electronically Signed   By: Genevie Ann M.D.   On: 04/08/2016 16:01   Mr Brain Wo Contrast  Result Date: 04/09/2016 CLINICAL DATA:  Left-sided numbness and weakness.  Aphasia. EXAM: MRI HEAD WITHOUT CONTRAST MRA HEAD WITHOUT CONTRAST TECHNIQUE: Multiplanar, multiecho  pulse sequences of the brain and surrounding structures were obtained without intravenous contrast. Angiographic images of the head were obtained using MRA technique without contrast. COMPARISON:  Head CT 04/08/2016 and 02/19/2016 FINDINGS: MRI HEAD FINDINGS Brain: No focal diffusion restriction to indicate acute infarct. No intraparenchymal hemorrhage. There is diffuse confluent hyperintense T2-weighted signal within the periventricular white matter, most often seen in the setting of chronic microvascular ischemia. There is an old left basal ganglia lacunar infarct. No mass lesion or midline shift. No hydrocephalus or extra-axial fluid collection. The midline structures are normal. No age advanced or lobar predominant atrophy. Vascular: Major intracranial arterial and venous sinus flow voids are preserved. No evidence of chronic microhemorrhage or amyloid angiopathy. Skull and upper cervical spine: The visualized skull base, calvarium, upper cervical spine and extracranial soft tissues are normal. Sinuses/Orbits: No fluid levels or advanced mucosal thickening. No mastoid effusion. Normal orbits. MRA HEAD FINDINGS Intracranial internal carotid arteries: Normal. Anterior cerebral arteries: Normal. Middle cerebral arteries: Normal. Posterior communicating arteries: Present bilaterally. Posterior cerebral arteries: Narrowing of the right P3 segment. Normal left PCA. Basilar artery: Normal. Vertebral arteries: Left dominant. Normal. Superior cerebellar arteries: Normal. Anterior inferior cerebellar arteries: Normal. Posterior inferior cerebellar arteries: Normal. IMPRESSION: 1. No acute ischemia. 2. Chronic microvascular ischemia and old left basal ganglia lacunar infarct. 3. No intracranial occlusion or high-grade arterial stenosis. Electronically Signed   By: Ulyses Jarred M.D.   On: 04/09/2016 04:23   Mr Jodene Nam Head/brain [id number] Cm  Result Date: 04/09/2016 CLINICAL DATA:  Left-sided numbness and weakness.  Aphasia. EXAM:  MRI HEAD WITHOUT CONTRAST MRA HEAD WITHOUT CONTRAST TECHNIQUE: Multiplanar, multiecho pulse sequences of the brain and surrounding structures were obtained without intravenous contrast. Angiographic images of the head were obtained using MRA technique without contrast. COMPARISON:  Head CT 04/08/2016 and 02/19/2016 FINDINGS: MRI HEAD FINDINGS Brain: No focal diffusion restriction to indicate acute infarct. No intraparenchymal hemorrhage. There is diffuse confluent hyperintense T2-weighted signal within the periventricular white matter, most often seen in the setting of chronic microvascular ischemia. There is an old left basal ganglia lacunar infarct. No mass lesion or midline shift. No hydrocephalus or extra-axial fluid collection. The midline structures are normal. No age advanced or lobar predominant atrophy. Vascular: Major intracranial arterial and venous sinus flow voids are preserved. No evidence of chronic microhemorrhage or amyloid angiopathy. Skull and upper cervical spine: The visualized skull base, calvarium, upper cervical spine and extracranial soft tissues are normal. Sinuses/Orbits: No fluid levels or advanced mucosal thickening. No mastoid effusion. Normal orbits. MRA HEAD FINDINGS Intracranial internal carotid arteries: Normal. Anterior cerebral arteries: Normal. Middle cerebral arteries: Normal. Posterior communicating arteries: Present bilaterally. Posterior cerebral arteries: Narrowing of the right P3 segment. Normal left PCA. Basilar artery: Normal. Vertebral arteries: Left dominant. Normal. Superior cerebellar arteries: Normal. Anterior inferior cerebellar arteries: Normal. Posterior inferior cerebellar arteries: Normal. IMPRESSION: 1. No acute ischemia. 2. Chronic microvascular ischemia and old left basal ganglia lacunar infarct. 3. No intracranial occlusion or high-grade arterial stenosis. Electronically Signed   By: Cletus Gash.D.  On: 04/09/2016 04:23    PHYSICAL EXAM Pleasant  elderly caucasian male not in distress.  . Afebrile. Head is nontraumatic. Neck is supple without bruit.    Cardiac exam no murmur or gallop. Lungs are clear to auscultation. Distal pulses are well felt. Neurological Exam ;  Awake  Alert oriented x 3. Normal speech and language.eye movements full without nystagmus.fundi were not visualized. Vision acuity and fields appear normal. Hearing is normal. Palatal movements are normal. Face symmetric. Tongue midline. Normal strength, tone, reflexes and coordination. Normal sensation. Gait deferred.  ASSESSMENT/PLAN Mr. RAINN SHATTO is a 83 y.o. male with history of HTN and CLL ( in remission) and CABG who is on DAPT presents after developing tunnel vision and expressive aphasia that lasted for 40-60min. He did not receive IV t-PA due to symptom resolution.   Recurrent TIA vs atypical Migraine  MRI  No acute stroke. Old L BG infarct  MRA  No occlusion or high grade stenosis  Carotid Doppler  pending   2D Echo  pending   LDL 46  HgbA1c pending  Heparin 5000 units sq tid for VTE prophylaxis  Diet Heart Room service appropriate? Yes; Fluid consistency: Thin  aspirin 81 mg daily and clopidogrel 75 mg daily prior to admission, recommend Change to  dipyridamole SR 250 mg/aspirin 25 mg orally twice a day for secondary stroke prevention. To prevent headache, most common side effect of Aggrenox, will start Aggrenox q hs x 2 weeks then increase Aggrenox to bid.  Until then, aspirin 81 mg q am x 2 weeks, then discontinue. May take Tylenol 650 mg 1 hr prior to Aggrenox for the first week, then discontinue.  Orders written.  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  Family hx stroke (brother)  Coronary artery disease s/p CABG  Carotid Stenosis  Aortic stenosis    Other Active Problems  CLL in remission  Herpes genitalis on daily valacyclovir  Neck pain  CKD stage III  thrombocytopenia  Hospital day # 0  BIBY,SHARON  [place] for Pager information 04/09/2016 11:43 AM  I have personally examined this patient, reviewed notes, independently viewed imaging studies, participated in medical decision making and plan of care.ROS completed by me personally and pertinent positives fully documented  I have made any additions or clarifications directly to the above note. Agree with note above. He presented one hour episode of vision dysfunction with an unlikely vision and left hand numbness which has cleared. He is had prior episodes of visual obscurations suggestive of possible ocular migraines. Is also had TIAs in the past. Recommend discontinue Plavix and changed to Aggrenox for secondary stroke prevention and continue ongoing stroke workup. Long discussion the bedside with the patient, wife and friends. Greater than 50% time during this 35 minute visit was spent on counseling and coordination of care about visual migraine, TIA, prevention and treatment  Antony Contras, MD  Medical Director [facility] Pager: [phone] 04/09/2016 4:30 PM  To contact Stroke Continuity provider, please refer to [email or web address] After hours, contact General Neurology

## 2016-04-09 NOTE — Care Management Obs Status (Signed)
[place] NOTIFICATION   Patient Details  Name: HURBERT WATERFORD MRN: [id number] Date of Birth: 29-Jul-1928   Medicare Observation Status Notification Given:  Yes    Pollie Friar, RN 04/09/2016, 2:10 PM

## 2016-04-09 NOTE — Progress Notes (Signed)
PT Cancellation Note  Patient Details Name: Jimmy [name] MRN: [id number] DOB: 23-Sep-1928   Cancelled Treatment:    Reason Eval/Treat Not Completed: PT screened, no needs identified, will sign off; spoke with OT and pt and feels like he is at baseline.  No current skilled PT needs.  Will sign off.    Reginia Naas 04/09/2016, 12:47 PM  Magda Kiel, [place] 04/09/2016

## 2016-04-10 ENCOUNTER — Telehealth: Payer: Self-pay | Admitting: *Deleted

## 2016-04-10 LAB — VAS US CAROTID
LEFT ECA DIAS: -14 cm/s
LEFT VERTEBRAL DIAS: -12 cm/s
Left CCA dist dias: -19 cm/s
Left CCA dist sys: -88 cm/s
Left CCA prox sys: 100 cm/s
Left ICA dist dias: -24 cm/s
Left ICA dist sys: -73 cm/s
Left ICA prox dias: -35 cm/s
Left ICA prox sys: -144 cm/s
RIGHT ECA DIAS: -51 cm/s
RIGHT VERTEBRAL DIAS: -18 cm/s
Right CCA prox dias: 19 cm/s
Right CCA prox sys: 118 cm/s
Right cca dist sys: -83 cm/s

## 2016-04-10 LAB — HEMOGLOBIN A1C
Hgb A1c MFr Bld: 5.9 % — ABNORMAL HIGH (ref 4.8–5.6)
Mean Plasma Glucose: 123 mg/dL

## 2016-04-10 NOTE — Progress Notes (Signed)
*  PRELIMINARY RESULTS* Vascular Ultrasound Carotid Duplex (Doppler) has been completed.  Preliminary findings:  Findings consistent with a high end  40 - 59 percent stenosis involving the right internal carotid artery. Findings consistent with a 1- 39 percent stenosis involving the left internal carotid artery. Bilateral vertebral arteries are patent and antegrade.  Everrett Coombe 04/10/2016, 8:49 AM

## 2016-04-10 NOTE — Telephone Encounter (Signed)
Transition Care Management Follow-up Telephone Call   Date discharged? 04/09/16   How have you been since you were released from the hospital? "Oh, I'm fine. No problems."   Do you understand why you were in the hospital? yes   Do you understand the discharge instructions? yes   Where were you discharged to? Home   Items Reviewed:  Medications reviewed: yes  Allergies reviewed: yes  Dietary changes reviewed: no, none made per pt  Referrals reviewed: yes, needs referral to neurology    Functional Questionnaire:   Activities of Daily Living (ADLs):   He states they are independent in the following: ambulation, bathing and hygiene, feeding, continence, grooming, toileting and dressing States they require assistance with the following: none   Any transportation issues/concerns?: no   Any patient concerns? no   Confirmed importance and date/time of follow-up visits scheduled yes  Provider Appointment booked with Dr. Ria Bush 04/17/16 @ 8am  Confirmed with patient if condition begins to worsen call PCP or go to the ER.  Patient was given the office number and encouraged to call back with question or concerns.  : yes

## 2016-04-10 NOTE — Progress Notes (Signed)
Late entry for missed G-code for OT evaluation 2016/04/29.   04/29/2016 0800  OT G-codes **NOT FOR INPATIENT CLASS**  Functional Assessment Tool Used clinical judgement  Functional Limitation Self care  Self Care Current Status [phone]) [facility]  Self Care Goal Status [id number]) [facility]  Self Care Discharge Status [phone]) [place], OTR/L [phone]

## 2016-04-16 NOTE — Discharge Summary (Signed)
[facility] Discharge Summary   Patient: Jimmy [name] [id number]   PCP: Ria Bush, MD DOB: 1929-02-13   Date of admission: 04/08/2016   Date of discharge: 04/09/2016     Discharge Diagnoses:  Principal Problem:   TIA (transient ischemic attack) Active Problems:   Hyperlipidemia   Essential hypertension   Carotid artery stenosis   Chronic lymphocytic leukemia, Rai stage 0 (HCC)   Chronic kidney disease, stage 3   S/P CABG (coronary artery bypass graft)   Thrombocytopenia ([place])   Admitted From: home Disposition:  home  Recommendations for Outpatient Follow-up:  1. Follow-up with PCP in one week. 2. Follow-up with neurology as recommended.   Follow-up Information    Ria Bush, MD. Schedule an appointment as soon as possible for a visit in 1 week(s).   Specialty:  Family Medicine Contact information: [place] [address] [address]. Schedule an appointment as soon as possible for a visit in 2 month(s).   Contact information: [address]     [address] [place] [phone]         Diet recommendation: cardiac diet  Activity: The patient is advised to gradually reintroduce usual activities.  Discharge Condition: good  Code Status: full code  History of present illness: As per the H and P dictated on admission, "Jimmy [name] is a 83 y.o. male with Past medical history of HTN, CLL, CABG, carotid artery stenosis. Patient presented with complains of left-sided numbness along with weakness and aphasia started at around 12 noon. Last her for 40-45 minutes and then resolved on its own. Patient presented to ER and mention that his symptoms are totally resolved. The time of my evaluation he complained about neck pain on the back. Denies any dizziness or lightheadedness no focal deficit no chest pain and abdominal pain and nausea no vomiting or  shortness of breath. Denies any fever or chills at home. He is blood pressure has been running higher recently and they have been adjusting his blood pressure medication as an outpatient. No other change in medications reported."  Hospital Course:   Summary of his active problems in the hospital is as following. 1. TIA (transient ischemic attack) No events on telemetry. Appreciate neurology input. Unremarkable MRI/MRA brain, carotid Doppler, echocardiogram. Monitor on telemetry. May Require establishing care with vascular surgery  2. Accelerated hypertension. blood pressure currently elevated. We will allow permissive hypertension. holding lisinopril.  3. Neck pain. Likely muscular skeletal in nature. Resolved   4. Chronic kidney disease stage III. relatively stable. Avoid nephrotoxic medication. Question the use of lisinopril as an outpatient.  5. Thrombocytopenia patient has chronic, thrombocytopenia chronically on aspirin and Plavix. Monitor.  All other chronic medical condition were stable during the hospitalization.  Patient was seen by physical therapy, who recommended no therapy, which was arranged by Education officer, museum and case Freight forwarder. On the day of the discharge the patient's vitals were stable , and no other acute medical condition were reported by patient. the patient was felt safe to be discharge at home with family.  Procedures and Results:  Echocardiogram  Vascular Doppler   Consultations:  Neurology  DISCHARGE MEDICATION: Discharge Medication List as of 04/09/2016  7:09 PM    START taking these medications   Details  !! dipyridamole-aspirin (AGGRENOX) 200-25 MG 12hr capsule Take 1 capsule by mouth at bedtime., Starting Tue 04/09/2016, Until Tue 04/23/2016, Normal    !!  dipyridamole-aspirin (AGGRENOX) 200-25 MG 12hr capsule Take 1 capsule by mouth 2 (two) times daily., Starting Tue 04/23/2016, Normal     !! - Potential duplicate medications found.  Please discuss with provider.    CONTINUE these medications which have CHANGED   Details  aspirin EC 81 MG tablet Take 1 tablet (81 mg total) by mouth daily., Starting Tue 04/09/2016, Until Tue 04/23/2016, Normal      CONTINUE these medications which have NOT CHANGED   Details  allopurinol (ZYLOPRIM) 300 MG tablet Take 1 tablet (300 mg total) by mouth daily., Starting Thu 11/09/2015, Normal    cholecalciferol (VITAMIN D) 1000 units tablet Take 2,000 Units by mouth daily., Historical Med    [employer] MELATONIN PO Take 1 tablet by mouth at bedtime., Historical Med    lisinopril (PRINIVIL,ZESTRIL) 10 MG tablet Take 1 tablet (10 mg total) by mouth daily., Starting Wed 09/20/2015, Normal    nitroGLYCERIN (NITROSTAT) 0.4 MG SL tablet Place 1 tablet (0.4 mg total) under the tongue every 5 (five) minutes as needed for chest pain (max 3 doses in 15 minutes)., Starting Wed 05/18/2014, Normal    simvastatin (ZOCOR) 10 MG tablet Take 1 tablet (10 mg total) by mouth at bedtime., Starting Thu 10/05/2015, Historical Med    valACYclovir (VALTREX) 500 MG tablet TAKE 1 TABLET DAILY, Normal      STOP taking these medications     clopidogrel (PLAVIX) 75 MG tablet        Allergies  Allergen Reactions  . New Skin   . Tape Dermatitis   Discharge Instructions    Diet - low sodium heart healthy    Complete by:  As directed    Diet - low sodium heart healthy    Complete by:  As directed    Discharge instructions    Complete by:  As directed    It is important that you read following instructions as well as go over your medication list with RN to help you understand your care after this hospitalization.  Discharge Instructions: Please follow-up with PCP in one week  Please request your primary care physician to go over all Hospital Tests and Procedure/Radiological results at the follow up,  Please get all Hospital records sent to your PCP by signing hospital release before you go home.   Do not take more  than prescribed Pain, Sleep and Anxiety Medications. You were cared for by a hospitalist during your hospital stay. If you have any questions about your discharge medications or the care you received while you were in the hospital after you are discharged, you can call the unit and ask to speak with the hospitalist on call if the hospitalist that took care of you is not available.  Once you are discharged, your primary care physician will handle any further medical issues. Please note that NO REFILLS for any discharge medications will be authorized once you are discharged, as it is imperative that you return to your primary care physician (or establish a relationship with a primary care physician if you do not have one) for your aftercare needs so that they can reassess your need for medications and monitor your lab values. You Must read complete instructions/literature along with all the possible adverse reactions/side effects for all the Medicines you take and that have been prescribed to you. Take any new Medicines after you have completely understood and accept all the possible adverse reactions/side effects. Wear Seat belts while driving. If you have smoked or chewed Tobacco  in the last 2 yrs please stop smoking and/or stop any Recreational drug use.   Discharge instructions    Complete by:  As directed    Change to  dipyridamole SR 250 mg/aspirin 25 mg orally twice a day for secondary stroke prevention. start Aggrenox every night for 2 weeks then increase Aggrenox to twice a day. Until then, aspirin 81 mg every morning for 2 weeks, then discontinue. May take Tylenol 650 mg 1 hr prior to Aggrenox for the first week, then discontinue.   Increase activity slowly    Complete by:  As directed    Increase activity slowly    Complete by:  As directed      Discharge Exam: There were no vitals filed for this visit. Vitals:   04/09/16 1303 04/09/16 1757  BP: (!) 122/49 (!) 125/59  Pulse: 76 74    Resp: 16 20  Temp: 97.5 F (36.4 C) 98.4 F (36.9 C)   General: Appear in no distress, no Rash; Oral Mucosa moist. Cardiovascular: S1 and S2 Present, no Murmur, no JVD Respiratory: Bilateral Air entry present and Clear to Auscultation, no Crackles, no wheezes Abdomen: Bowel Sound present, Soft and no tenderness Extremities: no Pedal edema, no calf tenderness Neurology: Grossly no focal neuro deficit.  The results of significant diagnostics from this hospitalization (including imaging, microbiology, ancillary and laboratory) are listed below for reference.    Significant Diagnostic Studies: Ct Head Wo Contrast  Result Date: 04/08/2016 CLINICAL DATA:  83 year old male with transient vision abnormality which began at noon today and lasted about 30 minutes. Initial encounter. TIA. Right ICA bulb stenosis. EXAM: CT HEAD WITHOUT CONTRAST TECHNIQUE: Contiguous axial images were obtained from the base of the skull through the vertex without intravenous contrast. COMPARISON:  Head CT and CTA head and neck 02/19/2016. Head CT 04/20/2013. FINDINGS: Brain: Chronic lacunar infarcts in the left greater than right deep gray matter nuclei re- demonstrated. Patchy and confluent bilateral cerebral white matter hypodensity appears stable. No cortically based acute infarct identified. No cortical encephalomalacia identified. No acute intracranial hemorrhage identified. No midline shift, mass effect, or evidence of intracranial mass lesion. No ventriculomegaly. Vascular: Calcified atherosclerosis at the skull base. No suspicious intracranial vascular hyperdensity. Skull: No acute osseous abnormality identified. Sinuses/Orbits: Visualized paranasal sinuses and mastoids are stable and well pneumatized. Other: Stable postoperative appearance of the globes and otherwise normal visible orbits soft tissues. Visualized scalp soft tissues are within normal limits. IMPRESSION: No acute intracranial abnormality. Stable non  contrast CT appearance of the brain with chronic small vessel ischemia. Electronically Signed   By: Genevie Ann M.D.   On: 04/08/2016 16:01   Mr Brain Wo Contrast  Result Date: 04/09/2016 CLINICAL DATA:  Left-sided numbness and weakness.  Aphasia. EXAM: MRI HEAD WITHOUT CONTRAST MRA HEAD WITHOUT CONTRAST TECHNIQUE: Multiplanar, multiecho pulse sequences of the brain and surrounding structures were obtained without intravenous contrast. Angiographic images of the head were obtained using MRA technique without contrast. COMPARISON:  Head CT 04/08/2016 and 02/19/2016 FINDINGS: MRI HEAD FINDINGS Brain: No focal diffusion restriction to indicate acute infarct. No intraparenchymal hemorrhage. There is diffuse confluent hyperintense T2-weighted signal within the periventricular white matter, most often seen in the setting of chronic microvascular ischemia. There is an old left basal ganglia lacunar infarct. No mass lesion or midline shift. No hydrocephalus or extra-axial fluid collection. The midline structures are normal. No age advanced or lobar predominant atrophy. Vascular: Major intracranial arterial and venous sinus flow voids are preserved. No evidence  of chronic microhemorrhage or amyloid angiopathy. Skull and upper cervical spine: The visualized skull base, calvarium, upper cervical spine and extracranial soft tissues are normal. Sinuses/Orbits: No fluid levels or advanced mucosal thickening. No mastoid effusion. Normal orbits. MRA HEAD FINDINGS Intracranial internal carotid arteries: Normal. Anterior cerebral arteries: Normal. Middle cerebral arteries: Normal. Posterior communicating arteries: Present bilaterally. Posterior cerebral arteries: Narrowing of the right P3 segment. Normal left PCA. Basilar artery: Normal. Vertebral arteries: Left dominant. Normal. Superior cerebellar arteries: Normal. Anterior inferior cerebellar arteries: Normal. Posterior inferior cerebellar arteries: Normal. IMPRESSION: 1. No acute  ischemia. 2. Chronic microvascular ischemia and old left basal ganglia lacunar infarct. 3. No intracranial occlusion or high-grade arterial stenosis. Electronically Signed   By: Ulyses Jarred M.D.   On: 04/09/2016 04:23   Mr Jodene Nam Head/brain [id number] Cm  Result Date: 04/09/2016 CLINICAL DATA:  Left-sided numbness and weakness.  Aphasia. EXAM: MRI HEAD WITHOUT CONTRAST MRA HEAD WITHOUT CONTRAST TECHNIQUE: Multiplanar, multiecho pulse sequences of the brain and surrounding structures were obtained without intravenous contrast. Angiographic images of the head were obtained using MRA technique without contrast. COMPARISON:  Head CT 04/08/2016 and 02/19/2016 FINDINGS: MRI HEAD FINDINGS Brain: No focal diffusion restriction to indicate acute infarct. No intraparenchymal hemorrhage. There is diffuse confluent hyperintense T2-weighted signal within the periventricular white matter, most often seen in the setting of chronic microvascular ischemia. There is an old left basal ganglia lacunar infarct. No mass lesion or midline shift. No hydrocephalus or extra-axial fluid collection. The midline structures are normal. No age advanced or lobar predominant atrophy. Vascular: Major intracranial arterial and venous sinus flow voids are preserved. No evidence of chronic microhemorrhage or amyloid angiopathy. Skull and upper cervical spine: The visualized skull base, calvarium, upper cervical spine and extracranial soft tissues are normal. Sinuses/Orbits: No fluid levels or advanced mucosal thickening. No mastoid effusion. Normal orbits. MRA HEAD FINDINGS Intracranial internal carotid arteries: Normal. Anterior cerebral arteries: Normal. Middle cerebral arteries: Normal. Posterior communicating arteries: Present bilaterally. Posterior cerebral arteries: Narrowing of the right P3 segment. Normal left PCA. Basilar artery: Normal. Vertebral arteries: Left dominant. Normal. Superior cerebellar arteries: Normal. Anterior inferior cerebellar  arteries: Normal. Posterior inferior cerebellar arteries: Normal. IMPRESSION: 1. No acute ischemia. 2. Chronic microvascular ischemia and old left basal ganglia lacunar infarct. 3. No intracranial occlusion or high-grade arterial stenosis. Electronically Signed   By: Ulyses Jarred M.D.   On: 04/09/2016 04:23   Time spent: 30 minutes  Signed:  Berle Mull  [facility] 04/09/2016  6:07 PM

## 2016-04-17 ENCOUNTER — Ambulatory Visit: Payer: Medicare Other | Admitting: Family Medicine

## 2016-04-24 DIAGNOSIS — R55 Syncope and collapse: Secondary | ICD-10-CM

## 2016-04-29 ENCOUNTER — Encounter: Payer: Self-pay | Admitting: Family Medicine

## 2016-04-29 ENCOUNTER — Ambulatory Visit (INDEPENDENT_AMBULATORY_CARE_PROVIDER_SITE_OTHER): Payer: Medicare Other | Admitting: Family Medicine

## 2016-04-29 VITALS — BP 132/66 | HR 80 | Temp 98.0°F | Wt 177.0 lb

## 2016-04-29 DIAGNOSIS — I6523 Occlusion and stenosis of bilateral carotid arteries: Secondary | ICD-10-CM

## 2016-04-29 DIAGNOSIS — C9110 Chronic lymphocytic leukemia of B-cell type not having achieved remission: Secondary | ICD-10-CM | POA: Diagnosis not present

## 2016-04-29 DIAGNOSIS — I25709 Atherosclerosis of coronary artery bypass graft(s), unspecified, with unspecified angina pectoris: Secondary | ICD-10-CM

## 2016-04-29 DIAGNOSIS — E7800 Pure hypercholesterolemia, unspecified: Secondary | ICD-10-CM | POA: Diagnosis not present

## 2016-04-29 DIAGNOSIS — I10 Essential (primary) hypertension: Secondary | ICD-10-CM

## 2016-04-29 DIAGNOSIS — G458 Other transient cerebral ischemic attacks and related syndromes: Secondary | ICD-10-CM | POA: Diagnosis not present

## 2016-04-29 MED ORDER — ASPIRIN-DIPYRIDAMOLE ER 25-200 MG PO CP12: 1.0000 | ORAL_CAPSULE | Freq: Two times a day (BID) | ORAL | 0 refills | Status: DC

## 2016-04-29 MED ORDER — ASPIRIN-DIPYRIDAMOLE ER 25-200 MG PO CP12: 1.0000 | ORAL_CAPSULE | Freq: Two times a day (BID) | ORAL | 1 refills | Status: DC

## 2016-04-29 NOTE — Assessment & Plan Note (Signed)
04/2016 - patent vertebrals with antegrade flow. High end 40-59% RICA stenosis. 123456 LICA stenosis.

## 2016-04-29 NOTE — Assessment & Plan Note (Signed)
Stable readings, continue simvastatin.

## 2016-04-29 NOTE — Progress Notes (Signed)
Pre visit review using our clinic review tool, if applicable. No additional management support is needed unless otherwise documented below in the visit note. 

## 2016-04-29 NOTE — Patient Instructions (Addendum)
Start aggrenox once daily and aspirin once daily for 1 week then increase to aggrenox twice daily, stop aspirin.  We will refer you to neurology for follow up after second mini-stroke. Return in 6 months for medicare wellness visit.

## 2016-04-29 NOTE — Assessment & Plan Note (Signed)
Chronic, stable. Continue current regimen of lisinopril 10mg  daily.

## 2016-04-29 NOTE — Progress Notes (Signed)
BP 132/66   Pulse 80   Temp 98 F (36.7 C) (Oral)   Wt 177 lb (80.3 kg)   BMI 26.14 kg/m    CC: hosp f/u visit Subjective:    [name], male    DOB: [birth year], 83 y.o.   MRN: [id number]  HPI: Jimmy [name] is a 83 y.o. male presenting on 04/29/2016 for Follow-up (hospital)   Recent hospitalization for TIA presenting with L sided numbness, weakness, aphasia. Hospital records reviewed. MRI without acute ischemia. Was on aspirin and plavix prior to TIA. Plavix was discontinued, aspirin 81mg  was continued along with once daily aggrenox through 04/23/2016, and aggrenox BID was started 04/23/2016. He states he received 1 dose of aggrenox. He never received aggrenox Rx. He has only been taking aspirin 81mg  daily.   Plavix was previously started 02/18/2017 after first TIA.   No further symptoms since he's been home. Feels fatigued after exercising.  Continues going to gym daily, 20-30 min workout daily.   Date of admission: 04/08/2016   Date of discharge: 04/09/2016 Hosp f/u phone call: 04/10/2016 Appt cancelled due to snow  Discharge Diagnoses:  Principal Problem:   TIA (transient ischemic attack) Active Problems:   Hyperlipidemia   Essential hypertension   Carotid artery stenosis   Chronic lymphocytic leukemia, Rai stage 0 (HCC)   Chronic kidney disease, stage 3   S/P CABG (coronary artery bypass graft)   Thrombocytopenia ([place])  Admitted From: home Disposition:  home  Recommendations for Outpatient Follow-up:  1. Follow-up with PCP in one week. 2. Follow-up with neurology as recommended.   Relevant past medical, surgical, family and social history reviewed and updated as indicated. Interim medical history since our last visit reviewed. Allergies and medications reviewed and updated. Current Outpatient Prescriptions on File Prior to Visit  Medication Sig  . allopurinol (ZYLOPRIM) 300 MG tablet Take 1 tablet (300 mg total) by mouth daily.  .  cholecalciferol (VITAMIN D) 1000 units tablet Take 2,000 Units by mouth daily.  . [employer] MELATONIN PO Take 1 tablet by mouth at bedtime as needed.   [name] lisinopril (PRINIVIL,ZESTRIL) 10 MG tablet Take 1 tablet (10 mg total) by mouth daily.  . nitroGLYCERIN (NITROSTAT) 0.4 MG SL tablet Place 1 tablet (0.4 mg total) under the tongue every 5 (five) minutes as needed for chest pain (max 3 doses in 15 minutes).  . simvastatin (ZOCOR) 10 MG tablet Take 1 tablet (10 mg total) by mouth at bedtime.  . valACYclovir (VALTREX) 500 MG tablet TAKE 1 TABLET DAILY   No current facility-administered medications on file prior to visit.     Review of Systems Per HPI unless specifically indicated in ROS section     Objective:    BP 132/66   Pulse 80   Temp 98 F (36.7 C) (Oral)   Wt 177 lb (80.3 kg)   BMI 26.14 kg/m   Wt Readings from Last 3 Encounters:  04/29/16 177 lb (80.3 kg)  02/19/16 171 lb (77.6 kg)  02/12/16 171 lb 12 oz (77.9 kg)    Physical Exam  Constitutional: He appears well-developed and well-nourished. No distress.  HENT:  Mouth/Throat: Oropharynx is clear and moist. No oropharyngeal exudate.  Cardiovascular: Normal rate, regular rhythm and intact distal pulses.   Murmur (3/6 SEM best at LUSB) heard. Pulmonary/Chest: Effort normal and breath sounds normal. No respiratory distress. He has no wheezes. He has no rales.  Musculoskeletal: He exhibits no edema.  Skin: Skin is warm and  dry. No rash noted.  Psychiatric: He has a normal mood and affect.  Nursing note and vitals reviewed.  Lab Results  Component Value Date   CHOL 109 04/09/2016   HDL 31 (L) 04/09/2016   LDLCALC 46 04/09/2016   TRIG 160 (H) 04/09/2016   CHOLHDL 3.5 04/09/2016       Assessment & Plan:   Problem List Items Addressed This Visit      Chronic   Chronic lymphocytic leukemia, Rai stage 0 ([place])    Appreciate [facility] Dr Lissa Merlin. Stage 0        Other   Carotid artery stenosis    04/2016 - patent  vertebrals with antegrade flow. High end 40-59% RICA stenosis. 123456 LICA stenosis.      Coronary atherosclerosis   Essential hypertension    Chronic, stable. Continue current regimen of lisinopril 10mg  daily.      Hyperlipidemia    Stable readings, continue simvastatin.       TIA (transient ischemic attack) - Primary    Remote lacunar CVAs by imaging, was on aspirin, then TIA 01/2016 --> aspirin + plavix, then recurrent TIA 04/2016 --> cross taper onto aggrenox, off plavix/aspirin. Pt states he never received any Rx for aggrenox so has just continued taking aspirin 81mg  daily.  Discussed importance of aggrenox - will refill today and have him complete rapid taper to aggrenox BID.  Recurrent TIA without explanation found - will refer to neurology.  Continue good BP control, lipid control.      Relevant Orders   Ambulatory referral to Neurology       Follow up plan: Return in about 6 months (around 10/27/2016) for medicare wellness visit.  Ria Bush, MD

## 2016-04-29 NOTE — Assessment & Plan Note (Signed)
Appreciate [facility] Dr Lissa Merlin. Stage 0

## 2016-04-29 NOTE — Assessment & Plan Note (Addendum)
Remote lacunar CVAs by imaging, was on aspirin, then TIA 01/2016 --> aspirin + plavix, then recurrent TIA 04/2016 --> cross taper onto aggrenox, off plavix/aspirin. Pt states he never received any Rx for aggrenox so has just continued taking aspirin 81mg  daily.  Discussed importance of aggrenox - will refill today and have him complete rapid taper to aggrenox BID.  Recurrent TIA without explanation found - will refer to neurology.  Continue good BP control, lipid control.

## 2016-05-02 ENCOUNTER — Encounter: Payer: Self-pay | Admitting: Neurology

## 2016-05-02 ENCOUNTER — Ambulatory Visit (INDEPENDENT_AMBULATORY_CARE_PROVIDER_SITE_OTHER): Payer: Medicare Other | Admitting: Neurology

## 2016-05-02 VITALS — BP 112/71 | HR 74 | Wt 171.4 lb

## 2016-05-02 DIAGNOSIS — I6523 Occlusion and stenosis of bilateral carotid arteries: Secondary | ICD-10-CM | POA: Diagnosis not present

## 2016-05-02 DIAGNOSIS — G459 Transient cerebral ischemic attack, unspecified: Secondary | ICD-10-CM | POA: Diagnosis not present

## 2016-05-02 NOTE — Patient Instructions (Signed)
I had a long d/w patient about his recent  TIA versus complicated migraine episode, risk for recurrent stroke/TIAs, personally independently reviewed imaging studies and stroke evaluation results and answered questions.Continue Aggrenox once a day for one more week and then increase to twice daily if tolerated for secondary stroke prevention and maintain strict control of hypertension with blood pressure goal below 130/90, diabetes with hemoglobin A1c goal below 6.5% and lipids with LDL cholesterol goal below 70 mg/dL. I also advised the patient to eat a healthy diet with plenty of whole grains, cereals, fruits and vegetables, exercise regularly and maintain ideal body weight Followup in the future with mildpractitioner in 6 months or call earlier if necessary

## 2016-05-02 NOTE — Progress Notes (Signed)
[facility] [address] [place]. [place] [address] [phone]       OFFICE FOLLOW-UP NOTE  Jimmy. Jimmy [name] Date of Birth:  [birth year] Medical Record Number:  [id number]   HPI: Jimmy [name] is a 42 year Caucasian male seen today for first office follow-up visit following hospital admission for TIA in January 2018. History is obtained from the patient and review of hospital records. He presented on 04/08/16 with episode of sudden onset of tunnel vision and lightning like  visual disturbances to his the periphery of both eyes. This lasted several minutes subsequently had trouble speaking and expressing himself lasting 30-60 minutes. He knew what he wanted his stay but could not get the words out. He did not lose consciousness. He denied any accompanying headache. Patient states she's had similar episodes of visual disturbance for the last 40 years. They occur at a variable frequency but without any obvious triggers and last only few minutes and this episode in January was much more prolonged. Patient was admitted for a stroke workup. MRI scan the brain obtained was personally reviewed by me did not show an acute stroke but showed an old left basal ganglia infarct. Carotid ultrasound showed 40-59% right ICA and no significant left ICA stenosis. LDL cholesterol was 46 mg percent and hemoglobin A1c was 5.9. Transthoracic echo showed normal ejection fraction and severe calcification with aortic valve with mild stenosis. Patient had been previously on aspirin and Plavix given his coronary artery disease this was switched to Aggrenox. Patient states that his done well since discharge and had no recurrent visual disturbance or speech problems however he was able to get his Aggrenox filled only few days ago. Is currently taking Aggrenox once a day with instruction to increase it after a week to twice daily if tolerated with headaches. States his blood pressure is under good control today is  112/71. Is tolerating Zocor well without muscle aches or pains.  ROS:   14 system review of systems is positive for  blurred vision, cough, constipation, easy bruising, runny nose, memory loss, headache and all other systems negative  PMH:  Past Medical History:  Diagnosis Date  . CAD (coronary artery disease)    a. 04/2001 S/P CABGx 4;  b. 2008 MV:  EF 63% normal perfusion.  . Carotid stenosis    a. 11/2012 Carotid U/S:  RICA [id number], LICA [id number].  . Chronic kidney disease, stage 3   . CLL (chronic lymphoblastic leukemia) 2009   Stage IV; Dr. Ivor Messier - referred to Dr. Lissa Merlin [facility] 07/2013 now on Rituxan (10/2013) --> stage 0 (09/2014)  . Diastolic CHF (Jimmy [name])    a. [id number] Echo: EF 55-65%, mild conc LVH, Gr 1 DD, mild AS, triv AI, mildly dil Ao root (3.5 cm).  . GERD (gastroesophageal reflux disease) 1990s  . History of CVA (cerebrovascular accident) 2015   by MRI - remote L internal capsule  . History of herpes genitalis    valtrex daily  . Hyperlipemia 2002  . Hypertension 2002  . Mass of submandibular region 2015   referred to gen surg after chemo  . Mild aortic stenosis    a. 07/2011 Echo: Mild AS, triv AI.  [name] Stroke (Jimmy [name])   . Thrombocytopenia ([place])    outpatient goals Hgb >9, plt >20    Social History:  Social History   Social History  . Marital status: Married    Spouse name: Jimmy [name]  . Number of children: Jimmy [name]  . Years  of education: Jimmy [name]   Occupational History  . Retired Furniture conservator/restorer Retired   Social History Main Topics  . Smoking status: Former Smoker    Packs/day: 2.00    Years: 30.00    Quit date: 02/28/1979  . Smokeless tobacco: Never Used     Comment: quit 1980  . Alcohol use No     Comment: occasional wine  . Drug use: No  . Sexual activity: Yes   Other Topics Concern  . Not on file   Social History Narrative   Married and lives with wife   1 son died of lung cancer at 31 years old   1 daughter bipolar, lives    Activity: walks 1 mi daily on treadmill     Diet: good water, some fruits/vegetables     Medications:   Current Outpatient Prescriptions on File Prior to Visit  Medication Sig Dispense Refill  . allopurinol (ZYLOPRIM) 300 MG tablet Take 1 tablet (300 mg total) by mouth daily. 90 tablet 1  . cholecalciferol (VITAMIN D) 1000 units tablet Take 2,000 Units by mouth daily.    . [facility] MELATONIN PO Take 1 tablet by mouth at bedtime as needed.     . dipyridamole-aspirin (AGGRENOX) 200-25 MG 12hr capsule Take 1 capsule by mouth 2 (two) times daily. 180 capsule 1  . lisinopril (PRINIVIL,ZESTRIL) 10 MG tablet Take 1 tablet (10 mg total) by mouth daily. 90 tablet 3  . nitroGLYCERIN (NITROSTAT) 0.4 MG SL tablet Place 1 tablet (0.4 mg total) under the tongue every 5 (five) minutes as needed for chest pain (max 3 doses in 15 minutes). 25 tablet 3  . simvastatin (ZOCOR) 10 MG tablet Take 1 tablet (10 mg total) by mouth at bedtime. 90 tablet 3  . valACYclovir (VALTREX) 500 MG tablet TAKE 1 TABLET DAILY 90 tablet 3   No current facility-administered medications on file prior to visit.     Allergies:   Allergies  Allergen Reactions  . New Skin   . Tape Dermatitis    Physical Exam General: well developed, well nourished Pleasant elderly Caucasian male, seated, in no evident distress Head: head normocephalic and atraumatic.  Neck: supple with no carotid or supraclavicular bruits Cardiovascular: regular rate and rhythm, no murmurs Musculoskeletal: no deformity Skin:  no rash/petichiae Vascular:  Normal pulses all extremities Vitals:   05/02/16 0906  BP: 112/71  Pulse: 74   Neurologic Exam Mental Status: Awake and fully alert. Oriented to place and time. Recent and remote memory intact. Attention span, concentration and fund of knowledge appropriate. Mood and affect appropriate.  Cranial Nerves: Fundoscopic exam reveals sharp disc margins. Pupils equal, briskly reactive to light. Extraocular movements full without nystagmus. Visual fields full  to confrontation. Hearing intact. Facial sensation intact. Face, tongue, palate moves normally and symmetrically.  Motor: Normal bulk and tone. Normal strength in all tested extremity muscles. Sensory.: intact to touch ,pinprick .position and vibratory sensation.  Coordination: Rapid alternating movements normal in all extremities. Finger-to-nose and heel-to-shin performed accurately bilaterally. Gait and Station: Arises from chair without difficulty. Stance is normal. Gait demonstrates normal stride length and balance . Able to heel, toe and tandem walk without difficulty.  Reflexes: 1+ and symmetric. Toes downgoing.   NIHSS 0 Modified Rankin  0   ASSESSMENT: 82 year Caucasian male with transient episode of expressive language difficulties and visual disturbance possible compromise migraine versus TIA. Long-standing history of multiple episodes of visual disturbances likely ocular migraine. No clinical history of stroke but MRI shows  silent left basal ganglia infarct. Vascular risk factors of hypertension hyperlipidemia and cerebrovascular disease, carotid stenosis and coronary artery disease   PLAN: I had a long d/w patient about his recent  TIA versus complicated migraine episode, risk for recurrent stroke/TIAs, personally independently reviewed imaging studies and stroke evaluation results and answered questions.Continue Aggrenox once a day for one more week and then increase to twice daily if tolerated for secondary stroke prevention and maintain strict control of hypertension with blood pressure goal below 130/90, diabetes with hemoglobin A1c goal below 6.5% and lipids with LDL cholesterol goal below 70 mg/dL. I also advised the patient to eat a healthy diet with plenty of whole grains, cereals, fruits and vegetables, exercise regularly and maintain ideal body weight Followup in the future with mildpractitioner in 6 months or call earlier if necessary Greater than 50% of time during this 25  minute visit was spent on counseling,explanation of diagnosis, planning of further management, discussion with patient and family and coordination of care Antony Contras, MD  [facility] [address] [place], [place] [address]  Phone [phone] Fax [phone] Note: This document was prepared with digital dictation and possible smart phrase technology. Any transcriptional errors that result from this process are unintentional

## 2016-05-06 ENCOUNTER — Telehealth: Payer: Self-pay

## 2016-05-06 NOTE — Telephone Encounter (Signed)
Received a refill request for Simvastatin 10mg  from [facility]. He has the 10mg  and the 20mg  on his medication list. Was not sure which one was corrected. Tried to figure it out with recent OV note. It says 10mg  on current med list in the note.

## 2016-05-07 ENCOUNTER — Other Ambulatory Visit: Payer: Self-pay | Admitting: *Deleted

## 2016-05-07 MED ORDER — SIMVASTATIN 10 MG PO TABS: 10.0000 mg | ORAL_TABLET | Freq: Every day | ORAL | 3 refills | Status: DC

## 2016-05-07 NOTE — Telephone Encounter (Signed)
Pt should be on 10mg  dose. Sent in.

## 2016-06-03 ENCOUNTER — Other Ambulatory Visit: Payer: Self-pay | Admitting: *Deleted

## 2016-06-03 MED ORDER — ALLOPURINOL 300 MG PO TABS: 300.0000 mg | ORAL_TABLET | Freq: Every day | ORAL | 1 refills | Status: DC

## 2016-06-07 ENCOUNTER — Telehealth: Payer: Self-pay

## 2016-06-07 NOTE — Telephone Encounter (Signed)
Patient should be taking aggrenox, not plavix. Please confirm this is the case.

## 2016-06-07 NOTE — Telephone Encounter (Signed)
[facility] left v/m; pt brought meds in for [facility] to discard and pt advised that he no longer takes plavix. [facility] Hawking at [facility] wanted to make sure Dr Darnell Level was aware pt is not taking Plavix. Per 04/29/16 f/u visit Dr Darnell Level noted that plavix had been d/c. Pt should be taking aggrenox.

## 2016-06-07 NOTE — Telephone Encounter (Signed)
Confirmed with patient and he not taking Plavix. He is currently taking Aggrenox.   Patient does not refill medication at [facility] but drop off Plavix to be discarded medication.

## 2016-07-18 ENCOUNTER — Emergency Department
Admission: EM | Admit: 2016-07-18 | Discharge: 2016-07-18 | Disposition: A | Discharge status: Home / Self Care | Payer: No Typology Code available for payment source | Attending: Emergency Medicine | Admitting: Emergency Medicine

## 2016-07-18 ENCOUNTER — Encounter: Payer: Self-pay | Admitting: Emergency Medicine

## 2016-07-18 DIAGNOSIS — Z79899 Other long term (current) drug therapy: Secondary | ICD-10-CM | POA: Insufficient documentation

## 2016-07-18 DIAGNOSIS — I509 Heart failure, unspecified: Secondary | ICD-10-CM | POA: Insufficient documentation

## 2016-07-18 DIAGNOSIS — T148XXA Other injury of unspecified body region, initial encounter: Secondary | ICD-10-CM

## 2016-07-18 DIAGNOSIS — S0081XA Abrasion of other part of head, initial encounter: Secondary | ICD-10-CM | POA: Diagnosis not present

## 2016-07-18 DIAGNOSIS — N183 Chronic kidney disease, stage 3 (moderate): Secondary | ICD-10-CM | POA: Diagnosis not present

## 2016-07-18 DIAGNOSIS — Z87891 Personal history of nicotine dependence: Secondary | ICD-10-CM | POA: Insufficient documentation

## 2016-07-18 DIAGNOSIS — Y999 Unspecified external cause status: Secondary | ICD-10-CM | POA: Diagnosis not present

## 2016-07-18 DIAGNOSIS — Z951 Presence of aortocoronary bypass graft: Secondary | ICD-10-CM | POA: Diagnosis not present

## 2016-07-18 DIAGNOSIS — I130 Hypertensive heart and chronic kidney disease with heart failure and stage 1 through stage 4 chronic kidney disease, or unspecified chronic kidney disease: Secondary | ICD-10-CM | POA: Insufficient documentation

## 2016-07-18 DIAGNOSIS — S0990XA Unspecified injury of head, initial encounter: Secondary | ICD-10-CM | POA: Diagnosis present

## 2016-07-18 DIAGNOSIS — Y939 Activity, unspecified: Secondary | ICD-10-CM | POA: Insufficient documentation

## 2016-07-18 DIAGNOSIS — I2510 Atherosclerotic heart disease of native coronary artery without angina pectoris: Secondary | ICD-10-CM | POA: Diagnosis not present

## 2016-07-18 DIAGNOSIS — Y92410 Unspecified street and highway as the place of occurrence of the external cause: Secondary | ICD-10-CM | POA: Diagnosis not present
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
## 2016-08-10 NOTE — Progress Notes (Signed)
Cardiology Office Note  Date:  08/12/2016   ID:  Jimmy [name], DOB 18-Dec-1928, MRN [id number]  PCP:  Ria Bush, MD   Chief Complaint  Patient presents with  . other    34mo f/u. Pt states he is doing well. Reviewed meds with pt verbally.    HPI:  [name] is a very pleasant 83 year-old male with a history of  CLL (WBC ~ 50k),  Smoked 28 yrs coronary artery disease, status post bypass surgery in 2001 Myoview in September 2008,  ejection fraction of 63% with no ischemia or infarct Negative stress test August 2015 hyperlipidemia,  hypertension,   Carotid u/s 12/2015  40 to 59% disease on the right Less than 39% on the left minimal bilateral renal artery stenosis.   6 rounds of treatment of CLL, finished in December.  He presents for followup of his coronary artery disease  Jan 2018, possible TIA Admitted to the hospital  visual problem, Speech issue He was not given TPA MRI documented stroke, old D/c on 04/09/2016  MRA: Brain  Chronic microvascular ischemia and old left basal ganglia lacunar infarct.  Denies any significant chest pain, feels back to his baseline Exercises on a regular basis  Lab work reviewed with him HBA1C 5.9 Total chol 109, LDL 46  Recent Carotid u/s 12/2015 reviewed with him  40 to 59% disease on the right Less than 39% on the left, Stable disease  EKG personally reviewed by myself on todays visit Shows no sinus rhythm rate 73 bpm consider old anteroseptal MI, old inferior MI  Other past medical history  completed 6 rounds of treatment, stopped in December 2015. He has follow-up with hematology oncology in March 2016  Recent lab work showing total cholesterol of 80 Carotid ultrasound with right greater than left disease, stable 60% on the right, 40% on the left EKG on today's visit shows normal sinus rhythm with rate 68 bpm, no significant ST or T-wave changes  Previous echocardiogram showing normal ejection fraction, mild  aortic valve stenosis Carotid u/s 2025: RICA 42-70% LICA 40 to 62%.   PMH:   has a past medical history of CAD (coronary artery disease); Carotid stenosis; Chronic kidney disease, stage 3; CLL (chronic lymphoblastic leukemia) (2009); Diastolic CHF ([place]); GERD (gastroesophageal reflux disease) (1990s); History of CVA (cerebrovascular accident) (2015); History of herpes genitalis; Hyperlipemia (2002); Hypertension (2002); Mass of submandibular region (2015); Mild aortic stenosis; Stroke [facility]); Thrombocytopenia ([place]); and TIA (transient ischemic attack).  PSH:    Past Surgical History:  Procedure Laterality Date  . ANGIOPLASTY  1998  . CATARACT EXTRACTION, BILATERAL     R 1/09, L 8/09  . COLONOSCOPY  11/29/1987   Normal  . COLONOSCOPY  02/07/2003   Hemm. Internal, focal proctitis, negative biopsy  . COLONOSCOPY  12/12/2004   Internal external hemorrhoids, +proctits, negative biopsy  . CORONARY ANGIOPLASTY  1998  . CORONARY ARTERY BYPASS GRAFT  04/23/2001   x4, Dr. Pia Mau  . ESOPHAGOGASTRODUODENOSCOPY  11/29/1987   Gastritis  . ETT  12/10/2006   Persantine myoview nml  . HEMORROIDECTOMY  1954   Fissure repair, [place]  . HEPATIC ARTERY ANGIOPLASTY  1954   [place], [place]  . KIDNEY STONE SURGERY  1977   Dr Redmond Baseman  . LITHOTRIPSY  1990s   Multiple  . US ECHOCARDIOGRAPHY  07/2011   nl sys fxn, EF 55-60%, grade 1 diast dysfunction, mild AS, mildly elevated PA pressure    Current Outpatient Prescriptions  Medication Sig Dispense  Refill  . allopurinol (ZYLOPRIM) 300 MG tablet Take 1 tablet (300 mg total) by mouth daily. 90 tablet 1  . cholecalciferol (VITAMIN D) 1000 units tablet Take 2,000 Units by mouth daily.    . DiphenhydrAMINE HCl (BENADRYL ALLERGY PO) Take by mouth at bedtime.    . dipyridamole-aspirin (AGGRENOX) 200-25 MG 12hr capsule Take 1 capsule by mouth 2 (two) times daily. 180 capsule 1  . lisinopril (PRINIVIL,ZESTRIL) 10 MG tablet Take 1 tablet (10 mg total) by mouth daily.  90 tablet 3  . nitroGLYCERIN (NITROSTAT) 0.4 MG SL tablet Place 1 tablet (0.4 mg total) under the tongue every 5 (five) minutes as needed for chest pain (max 3 doses in 15 minutes). 25 tablet 3  . simvastatin (ZOCOR) 10 MG tablet Take 1 tablet (10 mg total) by mouth at bedtime. 90 tablet 3  . valACYclovir (VALTREX) 500 MG tablet TAKE 1 TABLET DAILY 90 tablet 3   No current facility-administered medications for this visit.      Allergies:   New skin and Tape   Social History:  The patient  reports that he quit smoking about 37 years ago. He has a 60.00 pack-year smoking history. He has never used smokeless tobacco. He reports that he does not drink alcohol or use drugs.   Family History:   family history includes Alcohol abuse in his brother; Depression in his daughter; Diabetes in his brother and other; Heart attack in his brother; Heart disease in his mother; Hyperlipidemia in his father and mother; Hypertension in his father and mother; Kidney cancer in his sister; Stroke in his brother.    Review of Systems: Review of Systems  Constitutional: Negative.   Respiratory: Negative.   Cardiovascular: Negative.   Gastrointestinal: Negative.   Musculoskeletal: Negative.   Neurological: Negative.   Psychiatric/Behavioral: Negative.   All other systems reviewed and are negative.    PHYSICAL EXAM: VS:  BP (!) 96/50 (BP Location: Left Arm, Patient Position: Sitting, Cuff Size: Normal)   Ht 5\' 9"  (1.753 m)   Wt 172 lb 12 oz (78.4 kg)   BMI 25.51 kg/m  , BMI Body mass index is 25.51 kg/m. GEN: Well nourished, well developed, in no acute distress  HEENT: normal  Neck: no JVD, carotid bruits, or masses Cardiac: RRR; 1+ murmur RSB,  No rubs, or gallops,no edema  Respiratory:  clear to auscultation bilaterally, normal work of breathing GI: soft, nontender, nondistended, + BS MS: no deformity or atrophy  Skin: warm and dry, no rash Neuro:  Strength and sensation are intact Psych:  euthymic mood, full affect    Recent Labs: 04/09/2016: ALT 18; BUN 13; Creatinine, Ser 1.33; Hemoglobin 13.6; Platelets 122; Potassium 4.1; Sodium 136    Lipid Panel Lab Results  Component Value Date   CHOL 109 04/09/2016   HDL 31 (L) 04/09/2016   LDLCALC 46 04/09/2016   TRIG 160 (H) 04/09/2016      Wt Readings from Last 3 Encounters:  08/12/16 172 lb 12 oz (78.4 kg)  07/18/16 170 lb (77.1 kg)  05/02/16 171 lb 6.4 oz (77.7 kg)       ASSESSMENT AND PLAN:  Essential hypertension - Plan: EKG 12-Lead Blood pressure low on today's visit Recommended he decrease lisinopril down to 5 mg daily  Atherosclerosis of coronary artery bypass graft with angina pectoris, unspecified whether native or transplanted heart ([place]) - Plan: EKG 12-Lead Currently with no symptoms of angina. No further workup at this time. Continue current medication regimen.  Aortic valve stenosis, etiology of cardiac valve disease unspecified Mild aortic valve stenosis in 2015  Bilateral carotid artery stenosis Stable disease, will need periodic monitoring every 2 years   Pure hypercholesterolemia Cholesterol is at goal on the current lipid regimen. No changes to the medications were made.  Chronic lymphocytic leukemia, Rai stage 0 ([place]) Reports that he is clear from cancer  S/P CABG (coronary artery bypass graft) Currently with no symptoms of angina. No further workup at this time. Continue current medication regimen.  CVA Documented on MRA,  Visual and speech deficit January 2018   he is on Aggrenox twice a day Long discussion with him concerning various causes of stroke We have recommended event monitor to evaluate for atrial fibrillation recommended he monitor his pulse at home for irregularity    Total encounter time more than 25 minutes  Greater than 50% was spent in counseling and coordination of care with the patient  Disposition:   F/U  12  months   Orders Placed This Encounter   Procedures  . Cardiac event monitor  . EKG 12-Lead     Signed, Esmond Plants, M.D., Ph.D. 08/12/2016  [place], [place]

## 2016-08-12 ENCOUNTER — Ambulatory Visit (INDEPENDENT_AMBULATORY_CARE_PROVIDER_SITE_OTHER): Payer: Medicare Other | Admitting: Cardiovascular Disease

## 2016-08-12 ENCOUNTER — Ambulatory Visit: Payer: Medicare Other

## 2016-08-12 ENCOUNTER — Encounter: Payer: Self-pay | Admitting: Cardiovascular Disease

## 2016-08-12 VITALS — BP 96/50 | Ht 69.0 in | Wt 172.8 lb

## 2016-08-12 DIAGNOSIS — I638 Other cerebral infarction: Secondary | ICD-10-CM

## 2016-08-12 DIAGNOSIS — I6523 Occlusion and stenosis of bilateral carotid arteries: Secondary | ICD-10-CM

## 2016-08-12 DIAGNOSIS — I25708 Atherosclerosis of coronary artery bypass graft(s), unspecified, with other forms of angina pectoris: Secondary | ICD-10-CM | POA: Diagnosis not present

## 2016-08-12 DIAGNOSIS — N183 Chronic kidney disease, stage 3 unspecified: Secondary | ICD-10-CM

## 2016-08-12 DIAGNOSIS — C9110 Chronic lymphocytic leukemia of B-cell type not having achieved remission: Secondary | ICD-10-CM | POA: Diagnosis not present

## 2016-08-12 DIAGNOSIS — I10 Essential (primary) hypertension: Secondary | ICD-10-CM

## 2016-08-12 DIAGNOSIS — Z951 Presence of aortocoronary bypass graft: Secondary | ICD-10-CM

## 2016-08-12 DIAGNOSIS — E782 Mixed hyperlipidemia: Secondary | ICD-10-CM

## 2016-08-12 DIAGNOSIS — I209 Angina pectoris, unspecified: Secondary | ICD-10-CM

## 2016-08-12 DIAGNOSIS — I6389 Other cerebral infarction: Secondary | ICD-10-CM

## 2016-08-12 NOTE — Patient Instructions (Addendum)
Medication Instructions:   Please cut the lisinopril in 1/2 daily  Labwork:  No new labs needed  Testing/Procedures:  We will order a event monitor for recent CVA Event monitors are medical devices that record the heart's electrical activity. Doctors most often [place] these monitors to diagnose arrhythmias. Arrhythmias are problems with the speed or rhythm of the heartbeat. The monitor is a small, portable device. You can wear one while you do your normal daily activities. This is usually used to diagnose what is causing palpitations/syncope (passing out).  You will receive a call from [employer] to verify your address before they mail the monitor to your home.  It is very important that you answer this call. Once you receive the monitor, call the 1-800 # on the box and a representative will walk you through the application process and activate the monitor.   Follow-Up: It was a pleasure seeing you in the office today. Please call us if you have new issues that need to be addressed before your next appt.  [phone]  Your physician wants you to follow-up in: 12 months.  You will receive a reminder letter in the mail two months in advance. If you don't receive a letter, please call our office to schedule the follow-up appointment.  If you need a refill on your cardiac medications before your next appointment, please call your pharmacy.     Cardiac Event Monitoring A cardiac event monitor is a small recording device that is used to detect abnormal heart rhythms (arrhythmias). The monitor is used to record your heart rhythm when you have symptoms, such as:  Fast heartbeats (palpitations), such as heart racing or fluttering.  Dizziness.  Fainting or light-headedness.  Unexplained weakness. Some monitors are wired to electrodes placed on your chest. Electrodes are flat, sticky disks that attach to your skin. Other monitors may be hand-held or worn on the wrist. The monitor can be  worn for up to 30 days. If the monitor is attached to your chest, a technician will prepare your chest for the electrode placement and show you how to work the monitor. Take time to practice using the monitor before you leave the office. Make sure you understand how to send the information from the monitor to your health care provider. In some cases, you may need to use a landline telephone instead of a cell phone. What are the risks? Generally, this device is safe to use, but it possible that the skin under the electrodes will become irritated. How to use your cardiac event monitor  Wear your monitor at all times, except when you are in water:  Do not let the monitor get wet.  Take the monitor off when you bathe. Do not swim or use a hot tub with it on.  Keep your skin clean. Do not put body lotion or moisturizer on your chest.  Change the electrodes as told by your health care provider or any time they stop sticking to your skin. You may need to use medical tape to keep them on.  Try to put the electrodes in slightly different places on your chest to help prevent skin irritation. They must remain in the area under your left breast and in the upper right section of your chest.  Make sure the monitor is safely clipped to your clothing or in a location close to your body that your health care provider recommends.  Press the button to record as soon as you feel heart-related symptoms, such as:  Dizziness.  Weakness.  Light-headedness.  Palpitations.  Thumping or pounding in your chest.  Shortness of breath.  Unexplained weakness.  Keep a diary of your activities, such as walking, doing chores, and taking medicine. It is very important to note what you were doing when you pushed the button to record your symptoms. This will help your health care provider determine what might be contributing to your symptoms.  Send the recorded information as recommended by your health care  provider. It may take some time for your health care provider to process the results.  Change the batteries as told by your health care provider.  Keep electronic devices away from your monitor. This includes:  Tablets.  MP3 players.  Cell phones.  While wearing your monitor you should avoid:  Electric blankets.  Armed forces operational officer.  Electric toothbrushes.  Microwave ovens.  Magnets.  Metal detectors. Get help right away if:  You have chest pain.  You have extreme difficulty breathing or shortness of breath.  You develop a very fast heartbeat that persists.  You develop dizziness that does not go away.  You faint or constantly feel like you are about to faint. Summary  A cardiac event monitor is a small recording device that is used to help detect abnormal heart rhythms (arrhythmias).  The monitor is used to record your heart rhythm when you have heart-related symptoms.  Make sure you understand how to send the information from the monitor to your health care provider.  It is important to press the button on the monitor when you have any heart-related symptoms.  Keep a diary of your activities, such as walking, doing chores, and taking medicine. It is very important to note what you were doing when you pushed the button to record your symptoms. This will help your health care provider learn what might be causing your symptoms. This information is not intended to replace advice given to you by your health care provider. Make sure you discuss any questions you have with your health care provider. Document Released: 12/26/2007 Document Revised: 03/02/2016 Document Reviewed: 03/02/2016 Elsevier Interactive Patient Education  2017 [employer].

## 2016-08-17 ENCOUNTER — Encounter (INDEPENDENT_AMBULATORY_CARE_PROVIDER_SITE_OTHER): Payer: Medicare Other

## 2016-08-17 DIAGNOSIS — I6523 Occlusion and stenosis of bilateral carotid arteries: Secondary | ICD-10-CM | POA: Diagnosis not present

## 2016-08-17 DIAGNOSIS — C9110 Chronic lymphocytic leukemia of B-cell type not having achieved remission: Secondary | ICD-10-CM

## 2016-08-17 DIAGNOSIS — Z951 Presence of aortocoronary bypass graft: Secondary | ICD-10-CM | POA: Diagnosis not present

## 2016-08-17 DIAGNOSIS — E782 Mixed hyperlipidemia: Secondary | ICD-10-CM

## 2016-08-17 DIAGNOSIS — I25708 Atherosclerosis of coronary artery bypass graft(s), unspecified, with other forms of angina pectoris: Secondary | ICD-10-CM

## 2016-08-17 DIAGNOSIS — I10 Essential (primary) hypertension: Secondary | ICD-10-CM

## 2016-08-17 DIAGNOSIS — N183 Chronic kidney disease, stage 3 unspecified: Secondary | ICD-10-CM

## 2016-08-17 DIAGNOSIS — G464 Cerebellar stroke syndrome: Secondary | ICD-10-CM | POA: Diagnosis not present

## 2016-08-28 ENCOUNTER — Telehealth: Payer: Self-pay

## 2016-08-28 NOTE — Telephone Encounter (Signed)
Pt request refill valtrex; [facility]ills [facility] thru 12/2016. Pt will call [facility].

## 2016-09-09 DIAGNOSIS — Z8719 Personal history of other diseases of the digestive system: Secondary | ICD-10-CM | POA: Diagnosis not present

## 2016-09-09 DIAGNOSIS — I129 Hypertensive chronic kidney disease with stage 1 through stage 4 chronic kidney disease, or unspecified chronic kidney disease: Secondary | ICD-10-CM | POA: Diagnosis not present

## 2016-09-09 DIAGNOSIS — R221 Localized swelling, mass and lump, neck: Secondary | ICD-10-CM | POA: Diagnosis not present

## 2016-09-09 DIAGNOSIS — Z79899 Other long term (current) drug therapy: Secondary | ICD-10-CM | POA: Diagnosis not present

## 2016-09-09 DIAGNOSIS — C9190 Lymphoid leukemia, unspecified not having achieved remission: Secondary | ICD-10-CM | POA: Diagnosis not present

## 2016-09-09 DIAGNOSIS — N189 Chronic kidney disease, unspecified: Secondary | ICD-10-CM | POA: Diagnosis not present

## 2016-09-09 DIAGNOSIS — Z006 Encounter for examination for normal comparison and control in clinical research program: Secondary | ICD-10-CM | POA: Diagnosis not present

## 2016-09-09 DIAGNOSIS — E790 Hyperuricemia without signs of inflammatory arthritis and tophaceous disease: Secondary | ICD-10-CM | POA: Diagnosis not present

## 2016-09-09 DIAGNOSIS — Z85828 Personal history of other malignant neoplasm of skin: Secondary | ICD-10-CM | POA: Diagnosis not present

## 2016-09-09 DIAGNOSIS — E7800 Pure hypercholesterolemia, unspecified: Secondary | ICD-10-CM | POA: Diagnosis not present

## 2016-09-09 DIAGNOSIS — Z8673 Personal history of transient ischemic attack (TIA), and cerebral infarction without residual deficits: Secondary | ICD-10-CM | POA: Diagnosis not present

## 2016-09-19 ENCOUNTER — Other Ambulatory Visit: Payer: Self-pay | Admitting: *Deleted

## 2016-09-19 MED ORDER — LISINOPRIL 10 MG PO TABS: 10.0000 mg | ORAL_TABLET | Freq: Every day | ORAL | 1 refills | Status: DC

## 2016-10-01 NOTE — Progress Notes (Signed)
PCP notes:   Health maintenance: Tetanus due-patient to check w/ insurance regarding coverage.   Abnormal screenings: None.   Patient concerns: He has had diarrhea daily x2 weeks, no fever. He feels Aggrenox is causing the diarrhea.   Nurse concerns: None.   Next PCP appt: 10/18/2016 @ 8:30am

## 2016-10-01 NOTE — Progress Notes (Signed)
Subjective:   Jimmy [name] is a 83 y.o. male who presents for Medicare Annual/Subsequent preventive examination.  Review of Systems:  No ROS.  Medicare Wellness Visit. Additional risk factors are reflected in the social history.  Cardiac Risk Factors include: advanced age (>81men, >51 women);dyslipidemia;hypertension;male gender     Objective:    Vitals: BP 120/66   Pulse 78   Ht 5' 9.5" (1.765 m)   Wt 173 lb 4 oz (78.6 kg)   SpO2 98%   BMI 25.22 kg/m   Body mass index is 25.22 kg/m.  Tobacco History  Smoking Status  . Former Smoker  . Packs/day: 2.00  . Years: 30.00  . Quit date: 02/28/1979  Smokeless Tobacco  . Never Used    Comment: quit 1980     Counseling given: Not Answered   Past Medical History:  Diagnosis Date  . CAD (coronary artery disease)    a. 04/2001 S/P CABGx 4;  b. 2008 MV:  EF 63% normal perfusion.  . Carotid stenosis    a. 11/2012 Carotid U/S:  RICA 76-73%, LICA 41-93%.  . Chronic kidney disease, stage 3   . CLL (chronic lymphoblastic leukemia) 2009   Stage IV; Dr. Ivor Messier - referred to Dr. Lissa Merlin [facility] 07/2013 now on Rituxan (10/2013) --> stage 0 (09/2014)  . Diastolic CHF ([place])    a. 09/9022 Echo: EF 55-65%, mild conc LVH, Gr 1 DD, mild AS, triv AI, mildly dil Ao root (3.5 cm).  . GERD (gastroesophageal reflux disease) 1990s  . History of CVA (cerebrovascular accident) 2015   by MRI - remote L internal capsule  . History of herpes genitalis    valtrex daily  . Hyperlipemia 2002  . Hypertension 2002  . Mass of submandibular region 2015   referred to gen surg after chemo  . Mild aortic stenosis    a. 07/2011 Echo: Mild AS, triv AI.  [name] Stroke ([name])   . Thrombocytopenia ([place])    outpatient goals Hgb >9, plt >20  . TIA (transient ischemic attack)    04/2016   Past Surgical History:  Procedure Laterality Date  . ANGIOPLASTY  1998  . CATARACT EXTRACTION, BILATERAL     R 1/09, L 8/09  . COLONOSCOPY  11/29/1987   Normal  .  COLONOSCOPY  02/07/2003   Hemm. Internal, focal proctitis, negative biopsy  . COLONOSCOPY  12/12/2004   Internal external hemorrhoids, +proctits, negative biopsy  . CORONARY ANGIOPLASTY  1998  . CORONARY ARTERY BYPASS GRAFT  04/23/2001   x4, Dr. Pia Mau  . ESOPHAGOGASTRODUODENOSCOPY  11/29/1987   Gastritis  . ETT  12/10/2006   Persantine myoview nml  . HEMORROIDECTOMY  1954   Fissure repair, [place]  . HEPATIC ARTERY ANGIOPLASTY  1954   [place], [place]  . KIDNEY STONE SURGERY  1977   Dr Redmond Baseman  . LITHOTRIPSY  1990s   Multiple  . US ECHOCARDIOGRAPHY  07/2011   nl sys fxn, EF 55-60%, grade 1 diast dysfunction, mild AS, mildly elevated PA pressure   Family History  Problem Relation Age of Onset  . Hypertension Mother   . Heart disease Mother   . Hyperlipidemia Mother   . Hypertension Father   . Hyperlipidemia Father   . Kidney cancer Sister        Renal cell cancer  . Alcohol abuse Brother   . Diabetes Brother   . Stroke Brother   . Heart attack Brother        MI  .  Diabetes Other        Sister's daughter  . Depression Daughter        Bipolar   History  Sexual Activity  . Sexual activity: Yes    Outpatient Encounter Prescriptions as of 10/11/2016  Medication Sig  . AGGRENOX 25-200 MG 12hr capsule TAKE 1 CAPSULE TWICE A DAY  . allopurinol (ZYLOPRIM) 300 MG tablet Take 1 tablet (300 mg total) by mouth daily.  . cholecalciferol (VITAMIN D) 1000 units tablet Take 2,000 Units by mouth daily.  . DiphenhydrAMINE HCl (BENADRYL ALLERGY PO) Take by mouth at bedtime.  [name] lisinopril (PRINIVIL,ZESTRIL) 10 MG tablet Take 1 tablet (10 mg total) by mouth daily.  . nitroGLYCERIN (NITROSTAT) 0.4 MG SL tablet Place 1 tablet (0.4 mg total) under the tongue every 5 (five) minutes as needed for chest pain (max 3 doses in 15 minutes).  . simvastatin (ZOCOR) 10 MG tablet Take 1 tablet (10 mg total) by mouth at bedtime.  . valACYclovir (VALTREX) 500 MG tablet TAKE 1 TABLET DAILY  .  [DISCONTINUED] dipyridamole-aspirin (AGGRENOX) 200-25 MG 12hr capsule Take 1 capsule by mouth 2 (two) times daily.   No facility-administered encounter medications on file as of 10/11/2016.     Activities of Daily Living In your present state of health, do you have any difficulty performing the following activities: 10/11/2016  Hearing? N  Vision? N  Difficulty concentrating or making decisions? N  Walking or climbing stairs? N  Dressing or bathing? N  Doing errands, shopping? N  Preparing Food and eating ? N  Using the Toilet? N  In the past six months, have you accidently leaked urine? N  Do you have problems with loss of bowel control? N  Managing your Medications? N  Managing your Finances? N  Housekeeping or managing your Housekeeping? N  Some recent data might be hidden    Patient Care Team: Ria Bush, MD as PCP - General (Family Medicine) Minna Merritts, MD as Consulting Physician (Cardiology) Ralene Bathe, MD as Consulting Physician (Dermatology) Luci Bank, MD as Consulting Physician (Oncology) Garvin Fila, MD as Consulting Physician (Neurology)   Assessment:    Physical assessment deferred to PCP.  Exercise Activities and Dietary recommendations Current Exercise Habits: Home exercise routine, Type of exercise: Other - see comments;strength training/weights (stationary bike), Time (Minutes): 30, Frequency (Times/Week): 7, Weekly Exercise (Minutes/Week): 210, Intensity: Moderate  Goals    . Increase physical activity          Starting 10/11/2016, I will continue to exercise for at least 30 min 6-7 days per week.       Fall Risk Fall Risk  10/11/2016 05/02/2016 09/20/2015 09/16/2014 08/30/2013  Falls in the past year? No No No No No   Depression Screen PHQ 2/9 Scores 10/11/2016 09/20/2015 09/16/2014 08/30/2013  PHQ - 2 Score 0 0 0 0    Cognitive Function PLEASE NOTE: A Mini-Cog screen was completed. Maximum score is 20. A value of 0 denotes this  part of [place] MMSE was not completed or the patient failed this part of the Mini-Cog screening.   Mini-Cog Screening Orientation to Time - Max 5 pts Orientation to Place - Max 5 pts Registration - Max 3 pts Recall - Max 3 pts Language Repeat - Max 1 pts Language Follow 3 Step Command - Max 3 pts      Mini-Cog - 10/11/16 0800    Normal clock drawing test? yes   How many words correct? 2  MMSE - Mini Mental State Exam 10/11/2016 09/20/2015  Orientation to time 5 5  Orientation to Place 5 5  Registration 3 3  Attention/ Calculation 0 0  Recall 2 3  Language- name 2 objects 0 0  Language- repeat 1 1  Language- follow 3 step command 3 3  Language- read & follow direction 0 0  Write a sentence 0 0  Copy design 0 0  Total score 19 20        Immunization History  Administered Date(s) Administered  . Influenza Split 03/01/2011  . Influenza Whole 01/09/2004, 01/16/2007, 12/21/2007, 01/04/2010  . Influenza,inj,Quad PF,36+ Mos 01/07/2013, 12/29/2013, 01/02/2015, 01/01/2016  . Pneumococcal Conjugate-13 08/30/2013  . Pneumococcal Polysaccharide-23 04/01/1997  . Td 04/02/1995, 12/04/2005  . Zoster 09/19/2006   Screening Tests Health Maintenance  Topic Date Due  . DTaP/Tdap/Td (1 - Tdap) 10/11/2017 (Originally 12/05/2005)  . TETANUS/TDAP  10/11/2017 (Originally 12/05/2015)  . INFLUENZA VACCINE  10/30/2016  . PNA vac Low Risk Adult  Completed      Plan:    Follow-up w/ PCP as scheduled.  I have personally reviewed and noted the following in the patient's chart:   . Medical and social history . Use of alcohol, tobacco or illicit drugs  . Current medications and supplements . Functional ability and status . Nutritional status . Physical activity . Advanced directives . List of other physicians . Vitals . Screenings to include cognitive, depression, and falls . Referrals and appointments  In addition, I have reviewed and discussed with patient certain preventive  protocols, quality metrics, and best practice recommendations. A written personalized care plan for preventive services as well as general preventive health recommendations were provided to patient.     Dorrene German, RN  10/11/2016

## 2016-10-09 ENCOUNTER — Other Ambulatory Visit: Payer: Self-pay | Admitting: Family Medicine

## 2016-10-10 ENCOUNTER — Other Ambulatory Visit: Payer: Self-pay | Admitting: Family Medicine

## 2016-10-10 DIAGNOSIS — N289 Disorder of kidney and ureter, unspecified: Secondary | ICD-10-CM

## 2016-10-10 DIAGNOSIS — E559 Vitamin D deficiency, unspecified: Secondary | ICD-10-CM

## 2016-10-10 DIAGNOSIS — R7303 Prediabetes: Secondary | ICD-10-CM

## 2016-10-10 DIAGNOSIS — E782 Mixed hyperlipidemia: Secondary | ICD-10-CM

## 2016-10-11 ENCOUNTER — Other Ambulatory Visit (INDEPENDENT_AMBULATORY_CARE_PROVIDER_SITE_OTHER): Payer: Medicare Other

## 2016-10-11 ENCOUNTER — Ambulatory Visit (INDEPENDENT_AMBULATORY_CARE_PROVIDER_SITE_OTHER): Payer: Medicare Other

## 2016-10-11 VITALS — BP 120/66 | HR 78 | Ht 69.5 in | Wt 173.2 lb

## 2016-10-11 DIAGNOSIS — R7303 Prediabetes: Secondary | ICD-10-CM

## 2016-10-11 DIAGNOSIS — E559 Vitamin D deficiency, unspecified: Secondary | ICD-10-CM | POA: Diagnosis not present

## 2016-10-11 DIAGNOSIS — E782 Mixed hyperlipidemia: Secondary | ICD-10-CM | POA: Diagnosis not present

## 2016-10-11 DIAGNOSIS — Z0000 Encounter for general adult medical examination without abnormal findings: Secondary | ICD-10-CM | POA: Diagnosis not present

## 2016-10-11 DIAGNOSIS — N289 Disorder of kidney and ureter, unspecified: Secondary | ICD-10-CM

## 2016-10-11 LAB — LIPID PANEL
Cholesterol: 112 mg/dL (ref 0–200)
HDL: 36.6 mg/dL — ABNORMAL LOW (ref >39.00)
LDL Cholesterol: 49 mg/dL (ref 0–99)
NonHDL: 75.09
Total CHOL/HDL Ratio: 3
Triglycerides: 132 mg/dL (ref 0.0–149.0)
VLDL: 26.4 mg/dL (ref 0.0–40.0)

## 2016-10-11 LAB — RENAL FUNCTION PANEL
Albumin: 4.4 g/dL (ref 3.5–5.2)
BUN: 16 mg/dL (ref 6–23)
CO2: 28 mEq/L (ref 19–32)
Calcium: 10 mg/dL (ref 8.4–10.5)
Chloride: 101 mEq/L (ref 96–112)
Creatinine, Ser: 1.37 mg/dL (ref 0.40–1.50)
GFR: 52.41 mL/min — ABNORMAL LOW (ref >60.00)
Glucose, Bld: 111 mg/dL — ABNORMAL HIGH (ref 70–99)
Phosphorus: 2.3 mg/dL (ref 2.3–4.6)
Potassium: 4.6 mEq/L (ref 3.5–5.1)
Sodium: 137 mEq/L (ref 135–145)

## 2016-10-11 LAB — VITAMIN D 25 HYDROXY (VIT D DEFICIENCY, FRACTURES): VITD: 53.71 ng/mL (ref 30.00–100.00)

## 2016-10-11 LAB — HEMOGLOBIN A1C: Hgb A1c MFr Bld: 5.9 % (ref 4.6–6.5)

## 2016-10-11 NOTE — Patient Instructions (Signed)
Mr. Suydam , Thank you for taking time to come for your Medicare Wellness Visit. I appreciate your ongoing commitment to your health goals. Please review the following plan we discussed and let me know if I can assist you in the future.   These are the goals we discussed: Goals    . Increase physical activity          Starting 10/11/2016, I will continue to exercise for at least 30 min 6-7 days per week.        This is a list of the screening recommended for you and due dates:  Health Maintenance  Topic Date Due  . DTaP/Tdap/Td vaccine (1 - Tdap) 12/05/2005  . Tetanus Vaccine  12/05/2015  . Flu Shot  10/30/2016  . Pneumonia vaccines  Completed    Preventive Care for Adults  A healthy lifestyle and preventive care can promote health and wellness. Preventive health guidelines for adults include the following key practices.  . A routine yearly physical is a good way to check with your health care provider about your health and preventive screening. It is a chance to share any concerns and updates on your health and to receive a thorough exam.  . Visit your dentist for a routine exam and preventive care every 6 months. Brush your teeth twice a day and floss once a day. Good oral hygiene prevents tooth decay and gum disease.  . The frequency of eye exams is based on your age, health, family medical history, use  of contact lenses, and other factors. Follow your health care provider's ecommendations for frequency of eye exams.  . Eat a healthy diet. Foods like vegetables, fruits, whole grains, low-fat dairy products, and lean protein foods contain the nutrients you need without too many calories. Decrease your intake of foods high in solid fats, added sugars, and salt. Eat the right amount of calories for you. Get information about a proper diet from your health care provider, if necessary.  . Regular physical exercise is one of the most important things you can do for your health. Most  adults should get at least 150 minutes of moderate-intensity exercise (any activity that increases your heart rate and causes you to sweat) each week. In addition, most adults need muscle-strengthening exercises on 2 or more days a week.  Silver Sneakers may be a benefit available to you. To determine eligibility, you may visit the website: www.silversneakers.com or contact program at [phone] Mon-Fri between 8AM-8PM.   . Maintain a healthy weight. The body mass index (BMI) is a screening tool to identify possible weight problems. It provides an estimate of body fat based on height and weight. Your health care provider can find your BMI and can help you achieve or maintain a healthy weight.   For adults 20 years and older: ? A BMI below 18.5 is considered underweight. ? A BMI of 18.5 to 24.9 is normal. ? A BMI of 25 to 29.9 is considered overweight. ? A BMI of 30 and above is considered obese.   . Maintain normal blood lipids and cholesterol levels by exercising and minimizing your intake of saturated fat. Eat a balanced diet with plenty of fruit and vegetables. Blood tests for lipids and cholesterol should begin at age 37 and be repeated every 5 years. If your lipid or cholesterol levels are high, you are over 50, or you are at high risk for heart disease, you may need your cholesterol levels checked more frequently. Ongoing high lipid  and cholesterol levels should be treated with medicines if diet and exercise are not working.  . If you smoke, find out from your health care provider how to quit. If you do not use tobacco, please do not start.  . If you choose to drink alcohol, please do not consume more than 2 drinks per day. One drink is considered to be 12 ounces (355 mL) of beer, 5 ounces (148 mL) of wine, or 1.5 ounces (44 mL) of liquor.  . If you are 73-55 years old, ask your health care provider if you should take aspirin to prevent strokes.  . Use sunscreen. Apply sunscreen  liberally and repeatedly throughout the day. You should seek shade when your shadow is shorter than you. Protect yourself by wearing long sleeves, pants, a wide-brimmed hat, and sunglasses year round, whenever you are outdoors.  . Once a month, do a whole body skin exam, using a mirror to look at the skin on your back. Tell your health care provider of new moles, moles that have irregular borders, moles that are larger than a pencil eraser, or moles that have changed in shape or color.

## 2016-10-12 NOTE — Progress Notes (Signed)
I reviewed health advisor's note, was available for consultation, and agree with documentation and plan.  

## 2016-10-18 ENCOUNTER — Ambulatory Visit (INDEPENDENT_AMBULATORY_CARE_PROVIDER_SITE_OTHER): Payer: Medicare Other | Admitting: Family Medicine

## 2016-10-18 ENCOUNTER — Encounter: Payer: Self-pay | Admitting: Family Medicine

## 2016-10-18 VITALS — BP 108/68 | HR 76 | Temp 97.6°F | Wt 170.5 lb

## 2016-10-18 DIAGNOSIS — I350 Nonrheumatic aortic (valve) stenosis: Secondary | ICD-10-CM | POA: Diagnosis not present

## 2016-10-18 DIAGNOSIS — N183 Chronic kidney disease, stage 3 unspecified: Secondary | ICD-10-CM

## 2016-10-18 DIAGNOSIS — I10 Essential (primary) hypertension: Secondary | ICD-10-CM

## 2016-10-18 DIAGNOSIS — R7303 Prediabetes: Secondary | ICD-10-CM | POA: Diagnosis not present

## 2016-10-18 DIAGNOSIS — C9110 Chronic lymphocytic leukemia of B-cell type not having achieved remission: Secondary | ICD-10-CM | POA: Diagnosis not present

## 2016-10-18 DIAGNOSIS — R197 Diarrhea, unspecified: Secondary | ICD-10-CM | POA: Diagnosis not present

## 2016-10-18 DIAGNOSIS — E782 Mixed hyperlipidemia: Secondary | ICD-10-CM

## 2016-10-18 DIAGNOSIS — Z8673 Personal history of transient ischemic attack (TIA), and cerebral infarction without residual deficits: Secondary | ICD-10-CM

## 2016-10-18 DIAGNOSIS — I6523 Occlusion and stenosis of bilateral carotid arteries: Secondary | ICD-10-CM | POA: Diagnosis not present

## 2016-10-18 MED ORDER — LISINOPRIL 5 MG PO TABS: 5.0000 mg | ORAL_TABLET | Freq: Every day | ORAL | 2 refills | Status: DC

## 2016-10-18 NOTE — Assessment & Plan Note (Signed)
Known murmur.

## 2016-10-18 NOTE — Assessment & Plan Note (Signed)
Continue aggrenox bid.

## 2016-10-18 NOTE — Assessment & Plan Note (Deleted)
Continue aggrenox, statin.  

## 2016-10-18 NOTE — Assessment & Plan Note (Signed)
Stable period - will continue to monitor.  

## 2016-10-18 NOTE — Patient Instructions (Addendum)
Decrease lisinopril to 5mg  daily - [facility]. Pass by lab to pick up stool tests for diarrhea.  May take imodium 1-2 tablets a day to help control diarrhea.  If ongoing diarrhea, let us know for GI referral.  You are doing well today. Return as needed or in 1 year for next medicare wellness and labs.

## 2016-10-18 NOTE — Assessment & Plan Note (Signed)
Chronic, stable. Reviewed with patient. Continue to monitor.

## 2016-10-18 NOTE — Assessment & Plan Note (Signed)
Chronic, stable. Continue simvastatin.  

## 2016-10-18 NOTE — Assessment & Plan Note (Signed)
Chronic, stable. Will decrease lisinopril to 5mg  daily given some low bp readings recently here and at cardiology.

## 2016-10-18 NOTE — Progress Notes (Signed)
BP 108/68   Pulse 76   Temp 97.6 F (36.4 C) (Oral)   Wt 170 lb 8 oz (77.3 kg)   SpO2 97%   BMI 24.82 kg/m    CC: [facility] f/u visit Subjective:    [name], male    DOB: October 21, 1928, 83 y.o.   MRN: [id number]  HPI: Jimmy [name] is a 83 y.o. male presenting on 10/18/2016 for Follow-up   Saw Hoyle Sauer last week for medicare wellness visit. Note reviewed.   3 wk h/o diarrhea pt feels attributable to aggrenox. 3-4 times a day, watery diarrhea with some mucous. No fevers/chills, abd pain, nausea/vomiting, blood in stool. No stool at night. No greasy stool or trouble flushing. No recent travel. No new restaurants. No recent antibiotics. On aggrenox since 04/2016 after hospitalization for TIA while on aspirin and plavix.   CLL - followed by Dr Tenna Delaine. Latest note reviewed. Doing well off meds.   HLD - compliant with simvastatin 20mg  nightly without myalgias.  Carotid stenosis - followed yearly by cards.   He wore event monitor for 1 month this year - no arrhythmias found.  Preventative: Aged out of screens Immunizations UTD Shingrix - discussed, declines.  Seat belt use discussed Sunscreen use discussed, no changing moles on skin.  Ex smoker - quit 1980 Alcohol - none  Relevant past medical, surgical, family and social history reviewed and updated as indicated. Interim medical history since our last visit reviewed. Allergies and medications reviewed and updated. Outpatient Medications Prior to Visit  Medication Sig Dispense Refill  . AGGRENOX 25-200 MG 12hr capsule TAKE 1 CAPSULE TWICE A DAY 180 capsule 1  . allopurinol (ZYLOPRIM) 300 MG tablet Take 1 tablet (300 mg total) by mouth daily. 90 tablet 1  . cholecalciferol (VITAMIN D) 1000 units tablet Take 2,000 Units by mouth daily.    . diphenhydrAMINE (BENADRYL ALLERGY) 25 mg capsule Take 25 mg by mouth at bedtime.     . nitroGLYCERIN (NITROSTAT) 0.4 MG SL tablet Place 1 tablet (0.4 mg total) under the  tongue every 5 (five) minutes as needed for chest pain (max 3 doses in 15 minutes). 25 tablet 3  . simvastatin (ZOCOR) 10 MG tablet Take 1 tablet (10 mg total) by mouth at bedtime. 90 tablet 3  . valACYclovir (VALTREX) 500 MG tablet TAKE 1 TABLET DAILY 90 tablet 3  . lisinopril (PRINIVIL,ZESTRIL) 10 MG tablet Take 1 tablet (10 mg total) by mouth daily. 90 tablet 1   No facility-administered medications prior to visit.      Per HPI unless specifically indicated in ROS section below Review of Systems     Objective:    BP 108/68   Pulse 76   Temp 97.6 F (36.4 C) (Oral)   Wt 170 lb 8 oz (77.3 kg)   SpO2 97%   BMI 24.82 kg/m   Wt Readings from Last 3 Encounters:  10/18/16 170 lb 8 oz (77.3 kg)  10/11/16 173 lb 4 oz (78.6 kg)  08/12/16 172 lb 12 oz (78.4 kg)    Physical Exam  Constitutional: He is oriented to person, place, and time. He appears well-developed and well-nourished. No distress.  HENT:  Head: Normocephalic and atraumatic.  Right Ear: Hearing, tympanic membrane, external ear and ear canal normal.  Left Ear: Hearing, tympanic membrane, external ear and ear canal normal.  Nose: Nose normal.  Mouth/Throat: Uvula is midline, oropharynx is clear and moist and mucous membranes are normal. No oropharyngeal exudate,  posterior oropharyngeal edema or posterior oropharyngeal erythema.  Eyes: Pupils are equal, round, and reactive to light. Conjunctivae and EOM are normal. No scleral icterus.  Neck: Normal range of motion. Neck supple.  Cardiovascular: Normal rate, regular rhythm and intact distal pulses.   Murmur (systolic best at USB) heard. Pulses:      Radial pulses are 2+ on the right side, and 2+ on the left side.  Pulmonary/Chest: Effort normal and breath sounds normal. No respiratory distress. He has no wheezes. He has no rales.  Abdominal: Soft. Bowel sounds are normal. He exhibits no distension and no mass. There is no tenderness. There is no rebound and no guarding.    No abdominal pain  Musculoskeletal: Normal range of motion. He exhibits no edema.  Lymphadenopathy:    He has no cervical adenopathy.  Neurological: He is alert and oriented to person, place, and time.  CN grossly intact, station and gait intact  Skin: Skin is warm and dry. No rash noted.  Psychiatric: He has a normal mood and affect. His behavior is normal. Judgment and thought content normal.  Nursing note and vitals reviewed.  Results for orders placed or performed in visit on 10/11/16  Renal function panel  Result Value Ref Range   Sodium 137 135 - 145 mEq/L   Potassium 4.6 3.5 - 5.1 mEq/L   Chloride 101 96 - 112 mEq/L   CO2 28 19 - 32 mEq/L   Calcium 10.0 8.4 - 10.5 mg/dL   Albumin 4.4 3.5 - 5.2 g/dL   BUN 16 6 - 23 mg/dL   Creatinine, Ser 1.37 0.40 - 1.50 mg/dL   Glucose, Bld 111 (H) 70 - 99 mg/dL   Phosphorus 2.3 2.3 - 4.6 mg/dL   GFR 52.41 (L) >60.00 mL/min  VITAMIN D 25 Hydroxy (Vit-D Deficiency, Fractures)  Result Value Ref Range   VITD 53.71 30.00 - 100.00 ng/mL  Lipid panel  Result Value Ref Range   Cholesterol 112 0 - 200 mg/dL   Triglycerides 132.0 0.0 - 149.0 mg/dL   HDL 36.60 (L) >39.00 mg/dL   VLDL 26.4 0.0 - 40.0 mg/dL   LDL Cholesterol 49 0 - 99 mg/dL   Total CHOL/HDL Ratio 3    NonHDL 75.09   Hemoglobin A1c  Result Value Ref Range   Hgb A1c MFr Bld 5.9 4.6 - 6.5 %      Assessment & Plan:   Problem List Items Addressed This Visit      Chronic   Chronic lymphocytic leukemia, Rai stage 0 ([place])    Appreciate [facility] care of patient.         Other   Aortic stenosis    Known murmur.       Relevant Medications   lisinopril (PRINIVIL,ZESTRIL) 5 MG tablet   Chronic kidney disease, stage 3    Chronic, stable. Reviewed with patient. Continue to monitor.       Diarrhea - Primary    New watery diarrhea of 3wk duration. Discussed imodium use. Check GI pathogen panel as well as qualitative fecal fat. Pt agrees with plan.       Relevant  Orders   Gastrointestinal Pathogen Panel PCR   Fecal Fat, Qualitative   Essential hypertension    Chronic, stable. Will decrease lisinopril to 5mg  daily given some low bp readings recently here and at cardiology.       Relevant Medications   lisinopril (PRINIVIL,ZESTRIL) 5 MG tablet   History of CVA (cerebrovascular accident)  Continue aggrenox bid.      Mixed hyperlipidemia    Chronic, stable. Continue simvastatin.       Relevant Medications   lisinopril (PRINIVIL,ZESTRIL) 5 MG tablet   Prediabetes    Stable period - will continue to monitor.           Follow up plan: Return if symptoms worsen or fail to improve.  Ria Bush, MD

## 2016-10-18 NOTE — Assessment & Plan Note (Signed)
New watery diarrhea of 3wk duration. Discussed imodium use. Check GI pathogen panel as well as qualitative fecal fat. Pt agrees with plan.

## 2016-10-18 NOTE — Assessment & Plan Note (Signed)
Appreciate [facility] care of patient.

## 2016-10-21 LAB — GASTROINTESTINAL PATHOGEN PANEL PCR
C. difficile Tox A/B, PCR: NOT DETECTED
Campylobacter, PCR: NOT DETECTED
Cryptosporidium, PCR: NOT DETECTED
E coli (ETEC) LT/ST PCR: NOT DETECTED
E coli (STEC) stx1/stx2, PCR: NOT DETECTED
E coli 0157, PCR: NOT DETECTED
Giardia lamblia, PCR: NOT DETECTED
Norovirus, PCR: NOT DETECTED
Rotavirus A, PCR: NOT DETECTED
Salmonella, PCR: NOT DETECTED
Shigella, PCR: NOT DETECTED

## 2016-10-27 LAB — FECAL FAT, QUALITATIVE: Fecal Fat Qualitative: NORMAL

## 2016-10-28 ENCOUNTER — Encounter: Payer: Self-pay | Admitting: *Deleted

## 2016-10-29 NOTE — Progress Notes (Signed)
[facility]  PATIENT: Jimmy [name] DOB: [birth year]   REASON FOR VISIT: Follow-up for TIA January 2018 HISTORY FROM: Patient    HISTORY OF PRESENT ILLNESS:UPDATE 08/01/2018CM Jimmy [name], 83 year old male returns for follow-up with history of TIA events in January 2018. He is currently on Aggrenox twice daily without recurrent TIA or stroke symptoms. Denies any speech or visual difficulty. He has minimal bruising and no bleeding. Blood pressure in the office today 121/69. His lisinopril was recently decreased to 5 mg by cardiology. In addition he had 30 day event monitoring which did not show any arrhythmias or atrial fibrillation. He remains on Zocor and most recent LDL 49 on 10/11/2016. Most recent hemoglobin A1c 5.9. He continues to exercise riding a recumbent bike. He is driving without difficulty. He has no new neurologic complaints   HISTORY 05/02/16 PSMr [name] is a 11 year Caucasian male seen today for first office follow-up visit following hospital admission for TIA in January 2018. History is obtained from the patient and review of hospital records. He presented on 04/08/16 with episode of sudden onset of tunnel vision and lightning like  visual disturbances to his the periphery of both eyes. This lasted several minutes subsequently had trouble speaking and expressing himself lasting 30-60 minutes. He knew what he wanted his stay but could not get the words out. He did not lose consciousness. He denied any accompanying headache. Patient states she's had similar episodes of visual disturbance for the last 40 years. They occur at a variable frequency but without any obvious triggers and last only few minutes and this episode in January was much more prolonged. Patient was admitted for a stroke workup. MRI scan the brain obtained was personally reviewed by me did not show an acute stroke but showed an old left basal ganglia infarct. Carotid ultrasound showed 40-59% right  ICA and no significant left ICA stenosis. LDL cholesterol was 46 mg percent and hemoglobin A1c was 5.9. Transthoracic echo showed normal ejection fraction and severe calcification with aortic valve with mild stenosis. Patient had been previously on aspirin and Plavix given his coronary artery disease this was switched to Aggrenox. Patient states that his done well since discharge and had no recurrent visual disturbance or speech problems however he was able to get his Aggrenox filled only few days ago. Is currently taking Aggrenox once a day with instruction to increase it after a week to twice daily if tolerated with headaches. States his blood pressure is under good control today is 112/71. Is tolerating Zocor well without muscle aches or pains.   REVIEW OF SYSTEMS: Full 14 system review of systems performed and notable only for those listed, all others are neg:  Constitutional: neg  Cardiovascular: neg Ear/Nose/Throat: neg  Skin: neg Eyes: neg Respiratory: neg Gastroitestinal: neg  Hematology/Lymphatic: Easy bruising  Endocrine: neg Musculoskeletal:neg Allergy/Immunology: neg Neurological: neg Psychiatric: neg Sleep : neg   ALLERGIES: Allergies  Allergen Reactions  . New Skin   . Tape Dermatitis    HOME MEDICATIONS: Outpatient Medications Prior to Visit  Medication Sig Dispense Refill  . AGGRENOX 25-200 MG 12hr capsule TAKE 1 CAPSULE TWICE A DAY 180 capsule 1  . allopurinol (ZYLOPRIM) 300 MG tablet Take 1 tablet (300 mg total) by mouth daily. 90 tablet 1  . cholecalciferol (VITAMIN D) 1000 units tablet Take 2,000 Units by mouth daily.    . diphenhydrAMINE (BENADRYL ALLERGY) 25 mg capsule Take 25 mg by mouth at bedtime.     [name]  lisinopril (PRINIVIL,ZESTRIL) 5 MG tablet Take 1 tablet (5 mg total) by mouth daily. 90 tablet 2  . nitroGLYCERIN (NITROSTAT) 0.4 MG SL tablet Place 1 tablet (0.4 mg total) under the tongue every 5 (five) minutes as needed for chest pain (max 3 doses in 15  minutes). 25 tablet 3  . simvastatin (ZOCOR) 10 MG tablet Take 1 tablet (10 mg total) by mouth at bedtime. 90 tablet 3  . valACYclovir (VALTREX) 500 MG tablet TAKE 1 TABLET DAILY 90 tablet 3   No facility-administered medications prior to visit.     PAST MEDICAL HISTORY: Past Medical History:  Diagnosis Date  . CAD (coronary artery disease)    a. 04/2001 S/P CABGx 4;  b. 2008 MV:  EF 63% normal perfusion.  . Carotid stenosis    a. 11/2012 Carotid U/S:  RICA 47-82%, LICA 95-62%.  . Chronic kidney disease, stage 3   . CLL (chronic lymphoblastic leukemia) 2009   Stage IV; Dr. Ivor Messier - referred to Dr. Lissa Merlin [facility] 07/2013 now on Rituxan (10/2013) --> stage 0 (09/2014)  . Diastolic CHF ([place])    a. 04/3084 Echo: EF 55-65%, mild conc LVH, Gr 1 DD, mild AS, triv AI, mildly dil Ao root (3.5 cm).  . GERD (gastroesophageal reflux disease) 1990s  . History of CVA (cerebrovascular accident) 2015   by MRI - remote L internal capsule  . History of herpes genitalis    valtrex daily  . Hyperlipemia 2002  . Hypertension 2002  . Mass of submandibular region 2015   referred to gen surg after chemo  . Mild aortic stenosis    a. 07/2011 Echo: Mild AS, triv AI.  [name] Stroke ([place])   . Thrombocytopenia ([place])    outpatient goals Hgb >9, plt >20  . TIA (transient ischemic attack)    04/2016    PAST SURGICAL HISTORY: Past Surgical History:  Procedure Laterality Date  . ANGIOPLASTY  1998  . CATARACT EXTRACTION, BILATERAL     R 1/09, L 8/09  . COLONOSCOPY  11/29/1987   Normal  . COLONOSCOPY  02/07/2003   Hemm. Internal, focal proctitis, negative biopsy  . COLONOSCOPY  12/12/2004   Internal external hemorrhoids, +proctits, negative biopsy  . CORONARY ANGIOPLASTY  1998  . CORONARY ARTERY BYPASS GRAFT  04/23/2001   x4, Dr. Pia Mau  . ESOPHAGOGASTRODUODENOSCOPY  11/29/1987   Gastritis  . ETT  12/10/2006   Persantine myoview nml  . HEMORROIDECTOMY  1954   Fissure repair, [place]  . HEPATIC ARTERY  ANGIOPLASTY  1954   [place]  . KIDNEY STONE SURGERY  1977   Dr Redmond Baseman  . LITHOTRIPSY  1990s   Multiple  . US ECHOCARDIOGRAPHY  07/2011   nl sys fxn, EF 55-60%, grade 1 diast dysfunction, mild AS, mildly elevated PA pressure    FAMILY HISTORY: Family History  Problem Relation Age of Onset  . Hypertension Mother   . Heart disease Mother   . Hyperlipidemia Mother   . Hypertension Father   . Hyperlipidemia Father   . Kidney cancer Sister        Renal cell cancer  . Alcohol abuse Brother   . Diabetes Brother   . Stroke Brother   . Heart attack Brother        MI  . Diabetes Other        Sister's daughter  . Depression Daughter        Bipolar    SOCIAL HISTORY: Social History   Social History  .  Marital status: Married    Spouse name: N/A  . Number of children: N/A  . Years of education: N/A   Occupational History  . Retired Furniture conservator/restorer Retired   Social History Main Topics  . Smoking status: Former Smoker    Packs/day: 2.00    Years: 30.00    Quit date: 02/28/1979  . Smokeless tobacco: Never Used     Comment: quit 1980  . Alcohol use No     Comment: occasional wine  . Drug use: No  . Sexual activity: Yes   Other Topics Concern  . Not on file   Social History Narrative   Married and lives with wife   1 son died of lung cancer at 36 years old   5 daughter bipolar, lives    Activity: walks 1 mi daily on treadmill    Diet: good water, some fruits/vegetables      PHYSICAL EXAM  Vitals:   10/30/16 0813  BP: 121/69  Pulse: 76  Weight: 170 lb (77.1 kg)   Body mass index is 24.74 kg/m.  Generalized: Well developed, in no acute distress  Head: normocephalic and atraumatic,. Oropharynx benign  Neck: Supple, no carotid bruits  Cardiac: Regular rate rhythm, Musculoskeletal: No deformity   Neurological examination   Mentation: Alert oriented to time, place, history taking. Attention span and concentration appropriate. Recent and remote memory  intact.  Follows all commands speech and language fluent.   Cranial nerve II-XII: Pupils were equal round reactive to light extraocular movements were full, visual field were full on confrontational test. Facial sensation and strength were normal. hearing was intact to finger rubbing bilaterally. Uvula tongue midline. head turning and shoulder shrug were normal and symmetric.Tongue protrusion into cheek strength was normal. Motor: normal bulk and tone, full strength in the BUE, BLE, fine finger movements normal, no pronator drift. No focal weakness Sensory: normal and symmetric to light touch, pinprick, and  Vibration, in the upper and lower extremities Coordination: finger-nose-finger, heel-to-shin bilaterally, no dysmetria Reflexes: 1+ upper lower and symmetric, plantar responses were flexor bilaterally. Gait and Station: Rising up from seated position without assistance, normal stance,  moderate stride, good arm swing, smooth turning, able to perform tiptoe, and heel walking without difficulty. Tandem gait is steady. No assistive device  DIAGNOSTIC DATA (LABS, IMAGING, TESTING) - I reviewed patient records, labs, notes, testing and imaging myself where available.  Lab Results  Component Value Date   WBC 5.6 04/09/2016   HGB 13.6 04/09/2016   HCT 37.9 (L) 04/09/2016   MCV 89.0 04/09/2016   PLT 122 (L) 04/09/2016      Component Value Date/Time   NA 137 10/11/2016 0924   NA 141 06/01/2013 0950   K 4.6 10/11/2016 0924   K 4.8 06/01/2013 0950   CL 101 10/11/2016 0924   CL 106 01/15/2012 1446   CO2 28 10/11/2016 0924   CO2 28 06/01/2013 0950   GLUCOSE 111 (H) 10/11/2016 0924   GLUCOSE 130 06/01/2013 0950   GLUCOSE 143 (H) 01/15/2012 1446   BUN 16 10/11/2016 0924   BUN 22.3 06/01/2013 0950   CREATININE 1.37 10/11/2016 0924   CREATININE 1.7 (H) 06/01/2013 0950   CALCIUM 10.0 10/11/2016 0924   CALCIUM 9.8 06/01/2013 0950   PROT 6.1 (L) 04/09/2016 0535   PROT 6.6 06/01/2013 0950    ALBUMIN 4.4 10/11/2016 0924   ALBUMIN 3.9 06/01/2013 0950   AST 19 04/09/2016 0535   AST 43 (H) 06/01/2013 0950   ALT 18  04/09/2016 0535   ALT 33 06/01/2013 0950   ALKPHOS 89 04/09/2016 0535   ALKPHOS 161 (H) 06/01/2013 0950   BILITOT 1.0 04/09/2016 0535   BILITOT 0.58 06/01/2013 0950   GFRNONAA 47 (L) 04/09/2016 0535   GFRAA 55 (L) 04/09/2016 0535   Lab Results  Component Value Date   CHOL 112 10/11/2016   HDL 36.60 (L) 10/11/2016   LDLCALC 49 10/11/2016   TRIG 132.0 10/11/2016   CHOLHDL 3 10/11/2016   Lab Results  Component Value Date   HGBA1C 5.9 10/11/2016       ASSESSMENT AND PLAN 57 year Caucasian male with transient episode of expressive language difficulties and visual disturbance possible compromise migraine versus TIA. Long-standing history of multiple episodes of visual disturbances likely ocular migraine. No clinical history of stroke but MRI shows silent left basal ganglia infarct. Vascular risk factors of hypertension hyperlipidemia and cerebrovascular disease, carotid stenosis and coronary artery disease  PLAN: Stressed the importance of management of risk factors to prevent further TIA/stroke Continue Aggrenoxfor secondary stroke prevention Maintain strict control of hypertension with blood pressure goal below 130/90, today's reading 121/69 continue antihypertensive medications Control of diabetes with hemoglobin A1c below 6.5 followed by primary care most recent hemoglobin A1c5.9 Cholesterol with LDL cholesterol less than 70, followed by primary care,  most recent 49 continue simvastatin Exercise by walking or using bike   eat healthy diet with whole grains,  fresh fruits and vegetables Continue regular follow-up with cardiology Continue regular follow-up with PCP Will discharge from stroke clinic I spent 25 minutes in total face to face time with the patient more than 50% of which was spent counseling and coordination of care, reviewing test results  reviewing medications and discussing and reviewing the diagnosis of TIA/stroke and management of risk factors, routine follow-up with primary care and cardiology Dennie Bible, [facility], [facility], [facility] [address] [place], [place] [address] [phone]

## 2016-10-30 ENCOUNTER — Ambulatory Visit (INDEPENDENT_AMBULATORY_CARE_PROVIDER_SITE_OTHER): Payer: Medicare Other | Admitting: Nurse Practitioner

## 2016-10-30 ENCOUNTER — Encounter: Payer: Self-pay | Admitting: Nurse Practitioner

## 2016-10-30 VITALS — BP 121/69 | HR 76 | Wt 170.0 lb

## 2016-10-30 DIAGNOSIS — G458 Other transient cerebral ischemic attacks and related syndromes: Secondary | ICD-10-CM | POA: Diagnosis not present

## 2016-10-30 DIAGNOSIS — I6523 Occlusion and stenosis of bilateral carotid arteries: Secondary | ICD-10-CM

## 2016-10-30 DIAGNOSIS — I10 Essential (primary) hypertension: Secondary | ICD-10-CM | POA: Diagnosis not present

## 2016-10-30 DIAGNOSIS — E782 Mixed hyperlipidemia: Secondary | ICD-10-CM | POA: Diagnosis not present

## 2016-10-30 NOTE — Patient Instructions (Signed)
Stressed the importance of management of risk factors to prevent further TIA/stroke Continue Aggrenoxfor secondary stroke prevention Maintain strict control of hypertension with blood pressure goal below 130/90, today's reading 121/69 continue antihypertensive medications Control of diabetes with hemoglobin A1c below 6.5 followed by primary care most recent hemoglobin A1c5.9 Cholesterol with LDL cholesterol less than 70, followed by primary care,  most recent 49 continue simvastatin Exercise by walking or using bike   eat healthy diet with whole grains,  fresh fruits and vegetables Continue regular follow-up with cardiology Continue regular follow-up with PCP Will discharge from stroke clinic

## 2016-10-31 NOTE — Progress Notes (Signed)
I agree with the above plan 

## 2016-11-14 ENCOUNTER — Other Ambulatory Visit: Payer: Self-pay | Admitting: Family Medicine

## 2016-12-16 DIAGNOSIS — Z006 Encounter for examination for normal comparison and control in clinical research program: Secondary | ICD-10-CM | POA: Diagnosis not present

## 2016-12-16 DIAGNOSIS — Z7902 Long term (current) use of antithrombotics/antiplatelets: Secondary | ICD-10-CM | POA: Diagnosis not present

## 2016-12-16 DIAGNOSIS — I998 Other disorder of circulatory system: Secondary | ICD-10-CM | POA: Diagnosis not present

## 2016-12-16 DIAGNOSIS — E7800 Pure hypercholesterolemia, unspecified: Secondary | ICD-10-CM | POA: Diagnosis not present

## 2016-12-16 DIAGNOSIS — I129 Hypertensive chronic kidney disease with stage 1 through stage 4 chronic kidney disease, or unspecified chronic kidney disease: Secondary | ICD-10-CM | POA: Diagnosis not present

## 2016-12-16 DIAGNOSIS — C9190 Lymphoid leukemia, unspecified not having achieved remission: Secondary | ICD-10-CM | POA: Diagnosis not present

## 2016-12-16 DIAGNOSIS — Z85828 Personal history of other malignant neoplasm of skin: Secondary | ICD-10-CM | POA: Diagnosis not present

## 2016-12-16 DIAGNOSIS — Z8619 Personal history of other infectious and parasitic diseases: Secondary | ICD-10-CM | POA: Diagnosis not present

## 2016-12-16 DIAGNOSIS — Z8673 Personal history of transient ischemic attack (TIA), and cerebral infarction without residual deficits: Secondary | ICD-10-CM | POA: Diagnosis not present

## 2016-12-16 DIAGNOSIS — E790 Hyperuricemia without signs of inflammatory arthritis and tophaceous disease: Secondary | ICD-10-CM | POA: Diagnosis not present

## 2016-12-16 DIAGNOSIS — C9110 Chronic lymphocytic leukemia of B-cell type not having achieved remission: Secondary | ICD-10-CM | POA: Diagnosis not present

## 2016-12-16 DIAGNOSIS — Z7982 Long term (current) use of aspirin: Secondary | ICD-10-CM | POA: Diagnosis not present

## 2016-12-16 DIAGNOSIS — N189 Chronic kidney disease, unspecified: Secondary | ICD-10-CM | POA: Diagnosis not present

## 2016-12-16 DIAGNOSIS — Z79899 Other long term (current) drug therapy: Secondary | ICD-10-CM | POA: Diagnosis not present

## 2016-12-31 ENCOUNTER — Ambulatory Visit (INDEPENDENT_AMBULATORY_CARE_PROVIDER_SITE_OTHER): Payer: Medicare Other

## 2016-12-31 DIAGNOSIS — Z23 Encounter for immunization: Secondary | ICD-10-CM | POA: Diagnosis not present

## 2017-02-11 ENCOUNTER — Other Ambulatory Visit: Payer: Self-pay

## 2017-02-11 MED ORDER — VALACYCLOVIR HCL 500 MG PO TABS: ORAL_TABLET | ORAL | 3 refills | Status: DC

## 2017-02-11 NOTE — Telephone Encounter (Signed)
Last rx:  01/10/16, #90 Last OV:  10/18/16 Next OV:  10/20/17

## 2017-03-03 DIAGNOSIS — Z7902 Long term (current) use of antithrombotics/antiplatelets: Secondary | ICD-10-CM | POA: Diagnosis not present

## 2017-03-03 DIAGNOSIS — Z8673 Personal history of transient ischemic attack (TIA), and cerebral infarction without residual deficits: Secondary | ICD-10-CM | POA: Diagnosis not present

## 2017-03-03 DIAGNOSIS — Z85828 Personal history of other malignant neoplasm of skin: Secondary | ICD-10-CM | POA: Diagnosis not present

## 2017-03-03 DIAGNOSIS — I129 Hypertensive chronic kidney disease with stage 1 through stage 4 chronic kidney disease, or unspecified chronic kidney disease: Secondary | ICD-10-CM | POA: Diagnosis not present

## 2017-03-03 DIAGNOSIS — Z006 Encounter for examination for normal comparison and control in clinical research program: Secondary | ICD-10-CM | POA: Diagnosis not present

## 2017-03-03 DIAGNOSIS — Z79899 Other long term (current) drug therapy: Secondary | ICD-10-CM | POA: Diagnosis not present

## 2017-03-03 DIAGNOSIS — Z7982 Long term (current) use of aspirin: Secondary | ICD-10-CM | POA: Diagnosis not present

## 2017-03-03 DIAGNOSIS — N189 Chronic kidney disease, unspecified: Secondary | ICD-10-CM | POA: Diagnosis not present

## 2017-03-03 DIAGNOSIS — C9190 Lymphoid leukemia, unspecified not having achieved remission: Secondary | ICD-10-CM | POA: Diagnosis not present

## 2017-03-03 DIAGNOSIS — E7800 Pure hypercholesterolemia, unspecified: Secondary | ICD-10-CM | POA: Diagnosis not present

## 2017-03-03 DIAGNOSIS — C4491 Basal cell carcinoma of skin, unspecified: Secondary | ICD-10-CM | POA: Diagnosis not present

## 2017-03-03 DIAGNOSIS — E790 Hyperuricemia without signs of inflammatory arthritis and tophaceous disease: Secondary | ICD-10-CM | POA: Diagnosis not present

## 2017-03-19 DIAGNOSIS — C4441 Basal cell carcinoma of skin of scalp and neck: Secondary | ICD-10-CM | POA: Diagnosis not present

## 2017-03-19 DIAGNOSIS — L820 Inflamed seborrheic keratosis: Secondary | ICD-10-CM | POA: Diagnosis not present

## 2017-03-19 DIAGNOSIS — L570 Actinic keratosis: Secondary | ICD-10-CM | POA: Diagnosis not present

## 2017-03-19 DIAGNOSIS — C44319 Basal cell carcinoma of skin of other parts of face: Secondary | ICD-10-CM | POA: Diagnosis not present

## 2017-03-19 DIAGNOSIS — Z85828 Personal history of other malignant neoplasm of skin: Secondary | ICD-10-CM | POA: Diagnosis not present

## 2017-03-31 DIAGNOSIS — H4322 Crystalline deposits in vitreous body, left eye: Secondary | ICD-10-CM | POA: Diagnosis not present

## 2017-04-07 ENCOUNTER — Other Ambulatory Visit: Payer: Self-pay | Admitting: Family Medicine

## 2017-04-07 NOTE — Telephone Encounter (Signed)
Last filled:  01/09/17, #180 Last OV:  10/18/16 Next OV:  10/20/17

## 2017-04-16 DIAGNOSIS — C44319 Basal cell carcinoma of skin of other parts of face: Secondary | ICD-10-CM | POA: Diagnosis not present

## 2017-04-16 DIAGNOSIS — L578 Other skin changes due to chronic exposure to nonionizing radiation: Secondary | ICD-10-CM | POA: Diagnosis not present

## 2017-04-16 DIAGNOSIS — L821 Other seborrheic keratosis: Secondary | ICD-10-CM | POA: Diagnosis not present

## 2017-04-16 DIAGNOSIS — L820 Inflamed seborrheic keratosis: Secondary | ICD-10-CM | POA: Diagnosis not present

## 2017-04-16 DIAGNOSIS — C4441 Basal cell carcinoma of skin of scalp and neck: Secondary | ICD-10-CM | POA: Diagnosis not present

## 2017-04-16 DIAGNOSIS — L812 Freckles: Secondary | ICD-10-CM | POA: Diagnosis not present

## 2017-04-18 ENCOUNTER — Other Ambulatory Visit: Payer: Self-pay | Admitting: *Deleted

## 2017-04-18 DIAGNOSIS — I6523 Occlusion and stenosis of bilateral carotid arteries: Secondary | ICD-10-CM

## 2017-04-23 ENCOUNTER — Ambulatory Visit (INDEPENDENT_AMBULATORY_CARE_PROVIDER_SITE_OTHER): Payer: Medicare Other

## 2017-04-23 DIAGNOSIS — I6523 Occlusion and stenosis of bilateral carotid arteries: Secondary | ICD-10-CM | POA: Diagnosis not present

## 2017-04-29 ENCOUNTER — Other Ambulatory Visit: Payer: Self-pay | Admitting: *Deleted

## 2017-04-29 DIAGNOSIS — I6529 Occlusion and stenosis of unspecified carotid artery: Secondary | ICD-10-CM

## 2017-05-03 ENCOUNTER — Other Ambulatory Visit: Payer: Self-pay | Admitting: Family Medicine

## 2017-05-21 DIAGNOSIS — C44319 Basal cell carcinoma of skin of other parts of face: Secondary | ICD-10-CM | POA: Diagnosis not present

## 2017-05-21 DIAGNOSIS — C4441 Basal cell carcinoma of skin of scalp and neck: Secondary | ICD-10-CM | POA: Diagnosis not present

## 2017-05-21 DIAGNOSIS — L578 Other skin changes due to chronic exposure to nonionizing radiation: Secondary | ICD-10-CM | POA: Diagnosis not present

## 2017-05-21 DIAGNOSIS — L570 Actinic keratosis: Secondary | ICD-10-CM | POA: Diagnosis not present

## 2017-05-27 ENCOUNTER — Other Ambulatory Visit: Payer: Self-pay | Admitting: Family Medicine

## 2017-06-02 DIAGNOSIS — Z951 Presence of aortocoronary bypass graft: Secondary | ICD-10-CM | POA: Diagnosis not present

## 2017-06-02 DIAGNOSIS — I129 Hypertensive chronic kidney disease with stage 1 through stage 4 chronic kidney disease, or unspecified chronic kidney disease: Secondary | ICD-10-CM | POA: Diagnosis not present

## 2017-06-02 DIAGNOSIS — C9190 Lymphoid leukemia, unspecified not having achieved remission: Secondary | ICD-10-CM | POA: Diagnosis not present

## 2017-06-02 DIAGNOSIS — Z8673 Personal history of transient ischemic attack (TIA), and cerebral infarction without residual deficits: Secondary | ICD-10-CM | POA: Diagnosis not present

## 2017-06-02 DIAGNOSIS — E7800 Pure hypercholesterolemia, unspecified: Secondary | ICD-10-CM | POA: Diagnosis not present

## 2017-06-02 DIAGNOSIS — Z006 Encounter for examination for normal comparison and control in clinical research program: Secondary | ICD-10-CM | POA: Diagnosis not present

## 2017-06-02 DIAGNOSIS — R42 Dizziness and giddiness: Secondary | ICD-10-CM | POA: Diagnosis not present

## 2017-06-02 DIAGNOSIS — E790 Hyperuricemia without signs of inflammatory arthritis and tophaceous disease: Secondary | ICD-10-CM | POA: Diagnosis not present

## 2017-06-02 DIAGNOSIS — I2510 Atherosclerotic heart disease of native coronary artery without angina pectoris: Secondary | ICD-10-CM | POA: Diagnosis not present

## 2017-06-02 DIAGNOSIS — Z79899 Other long term (current) drug therapy: Secondary | ICD-10-CM | POA: Diagnosis not present

## 2017-06-02 DIAGNOSIS — Z7902 Long term (current) use of antithrombotics/antiplatelets: Secondary | ICD-10-CM | POA: Diagnosis not present

## 2017-06-02 DIAGNOSIS — Z85828 Personal history of other malignant neoplasm of skin: Secondary | ICD-10-CM | POA: Diagnosis not present

## 2017-06-02 DIAGNOSIS — N189 Chronic kidney disease, unspecified: Secondary | ICD-10-CM | POA: Diagnosis not present

## 2017-06-02 DIAGNOSIS — C91Z0 Other lymphoid leukemia not having achieved remission: Secondary | ICD-10-CM | POA: Diagnosis not present

## 2017-06-02 DIAGNOSIS — R937 Abnormal findings on diagnostic imaging of other parts of musculoskeletal system: Secondary | ICD-10-CM | POA: Diagnosis not present

## 2017-06-02 DIAGNOSIS — Z8619 Personal history of other infectious and parasitic diseases: Secondary | ICD-10-CM | POA: Diagnosis not present

## 2017-06-17 ENCOUNTER — Ambulatory Visit (INDEPENDENT_AMBULATORY_CARE_PROVIDER_SITE_OTHER): Payer: Medicare Other | Admitting: Family Medicine

## 2017-06-17 ENCOUNTER — Encounter: Payer: Self-pay | Admitting: Family Medicine

## 2017-06-17 VITALS — Temp 97.8°F | Wt 167.0 lb

## 2017-06-17 DIAGNOSIS — I6523 Occlusion and stenosis of bilateral carotid arteries: Secondary | ICD-10-CM | POA: Diagnosis not present

## 2017-06-17 DIAGNOSIS — G309 Alzheimer's disease, unspecified: Secondary | ICD-10-CM | POA: Insufficient documentation

## 2017-06-17 DIAGNOSIS — C9110 Chronic lymphocytic leukemia of B-cell type not having achieved remission: Secondary | ICD-10-CM | POA: Diagnosis not present

## 2017-06-17 DIAGNOSIS — F0150 Vascular dementia without behavioral disturbance: Secondary | ICD-10-CM | POA: Insufficient documentation

## 2017-06-17 DIAGNOSIS — G3184 Mild cognitive impairment, so stated: Secondary | ICD-10-CM | POA: Diagnosis not present

## 2017-06-17 DIAGNOSIS — R42 Dizziness and giddiness: Secondary | ICD-10-CM | POA: Diagnosis not present

## 2017-06-17 DIAGNOSIS — Z8673 Personal history of transient ischemic attack (TIA), and cerebral infarction without residual deficits: Secondary | ICD-10-CM | POA: Diagnosis not present

## 2017-06-17 MED ORDER — VITAMIN B-12 1000 MCG PO TABS: 1000.0000 ug | ORAL_TABLET | Freq: Every day | ORAL | Status: DC

## 2017-06-17 NOTE — Progress Notes (Signed)
Temp 97.8 F (36.6 C) (Oral)   Wt 167 lb (75.8 kg)   SpO2 96%   BMI 24.31 kg/m    CC: dizziness, weakness Subjective:    [name], male    DOB: 1928-05-08, 84 y.o.   MRN: [id number]  HPI: Jimmy [name] is a 84 y.o. male presenting on 06/17/2017 for Dizziness (Also, c/o weakness for about 1 wk. Oncologist suggests possible vit B12 deficiency.)   CLL stage 0 followed by oncology at [facility]. Stable period - observation alone.  Known CKD, CAD s/p 4v CABG 2001, HLD and HTN, prior TIA initially on plavix, now on aggrenox after TIA recurrence.   Worsening dizziness, memory loss noted by wife. Previously attributed to low bp readings. Prior MRI showed extensive chronic microvascular ischemic disease. Patient notes some episodes of dizziness after his exercise routine in the morning. 2 wks ago he had a fall - ripping over his own feet. Dizziness described as "off balance". Possible presyncope. Denies significant orthostatic symptoms. No vertigo or syncope.Trouble getting up after going on his knees due to weakness. Memory trouble - he forgets next step, gets sidetracked. Got disoriented when driving home from eye doctor through [place]. Still drives alone well.   No step-wise progression of memory troubles   Denies tremors.  No continence issues. No known fam hx dementia (family members died at earlier age however)  B12 at [facility] was 17 (05/2017).   Geriatric Assessment: Activities of Daily Living:     Bathing- independent    Dressing- independent    Eating- independent    Toileting- independent     Transferring- independent    Continence- independent (no accidents) Overall Assessment: independent  Instrumental Activities of Daily Living:     Transportation- independent    Meal/Food Preparation- independent    Shopping Errands- independent    Housekeeping/Chores- dependent    Money Management/Finances- partially dependent - wife has started writing checks due  to worsening handwriting    Medication Management- independent    Ability to Use Telephone- independent    Laundry- dependent Overall Assessment: independent  Mental Status Exam: (value/max value): 26/30, 28/30 with cues Clock Drawing Score: 4/4   Relevant past medical, surgical, family and social history reviewed and updated as indicated. Interim medical history since our last visit reviewed. Allergies and medications reviewed and updated. Outpatient Medications Prior to Visit  Medication Sig Dispense Refill  . AGGRENOX 25-200 MG 12hr capsule TAKE 1 CAPSULE TWICE A DAY 180 capsule 1  . allopurinol (ZYLOPRIM) 300 MG tablet TAKE 1 TABLET DAILY 90 tablet 1  . cholecalciferol (VITAMIN D) 1000 units tablet Take 2,000 Units by mouth daily.    . diphenhydrAMINE (BENADRYL ALLERGY) 25 mg capsule Take 25 mg by mouth at bedtime.     [name] lisinopril (PRINIVIL,ZESTRIL) 5 MG tablet Take 1 tablet (5 mg total) by mouth daily. 90 tablet 2  . nitroGLYCERIN (NITROSTAT) 0.4 MG SL tablet Place 1 tablet (0.4 mg total) under the tongue every 5 (five) minutes as needed for chest pain (max 3 doses in 15 minutes). 25 tablet 3  . simvastatin (ZOCOR) 10 MG tablet TAKE 1 TABLET AT BEDTIME 90 tablet 1  . valACYclovir (VALTREX) 500 MG tablet TAKE 1 TABLET DAILY 90 tablet 3   No facility-administered medications prior to visit.      Per HPI unless specifically indicated in ROS section below Review of Systems     Objective:    Temp 97.8 F (36.6 C) (Oral)  Wt 167 lb (75.8 kg)   SpO2 96%   BMI 24.31 kg/m   Wt Readings from Last 3 Encounters:  06/17/17 167 lb (75.8 kg)  10/30/16 170 lb (77.1 kg)  10/18/16 170 lb 8 oz (77.3 kg)    Physical Exam  Constitutional: He appears well-developed and well-nourished. No distress.  HENT:  Mouth/Throat: Oropharynx is clear and moist. No oropharyngeal exudate.  Cardiovascular: Normal rate, regular rhythm, normal heart sounds and intact distal pulses.  No murmur  heard. Pulmonary/Chest: Effort normal and breath sounds normal. No respiratory distress. He has no wheezes. He has no rales.  Musculoskeletal: He exhibits no edema.  Skin: Skin is warm and dry. No rash noted.  Psychiatric: He has a normal mood and affect. His behavior is normal. Judgment and thought content normal.  Nursing note and vitals reviewed.  Results for orders placed or performed in visit on 10/18/16  Fecal Fat, Qualitative  Result Value Ref Range   Fecal Fat Qualitative Normal Normal  Gastrointestinal Pathogen Panel PCR  Result Value Ref Range   Campylobacter, PCR Not Detected    C. difficile Tox A/B, PCR Not Detected    E coli 0157, PCR Not Detected    E coli (ETEC) LT/ST PCR Not Detected    E coli (STEC) stx1/stx2, PCR Not Detected    Salmonella, PCR Not Detected    Shigella, PCR Not Detected    Norovirus, PCR Not Detected    Rotavirus A, PCR Not Detected    Giardia lamblia, PCR Not Detected    Cryptosporidium, PCR Not Detected    Lab Results  Component Value Date   CREATININE 1.37 10/11/2016   BUN 16 10/11/2016   NA 137 10/11/2016   K 4.6 10/11/2016   CL 101 10/11/2016   CO2 28 10/11/2016       Assessment & Plan:   Problem List Items Addressed This Visit      Chronic   Chronic lymphocytic leukemia, Rai stage 0 ([place])    Appreciate [facility] onc care.         Other   Dizziness    BP supine 140/68 --> 126/60 standing.  Encouraged good hydration status.  Continue to monitor.       History of CVA (cerebrovascular accident)   MCI (mild cognitive impairment) with memory loss - Primary    MMSE stable today. Reviewed with patient and wife. Anticipate he does have some mild cognitive impairment but not yet to level of dementia or affecting daily function. Encouraged regular reading, word puzzles, physical activity and social engagement as ways to combat worsening memory trouble. B12 level recently checked and low normal. Encouraged he start 1000 mcg B12 orally daily  and will continue to monitor closely. We briefly discussed medication like aricept. Will reassess at f/u visit and at regular intervals. Pt and wife agree with plan.  He has h/o CVA but memory troubles not quite consistent with vascular dementia.           Meds ordered this encounter  Medications  . vitamin B-12 (CYANOCOBALAMIN) 1000 MCG tablet    Sig: Take 1 tablet (1,000 mcg total) by mouth daily.   No orders of the defined types were placed in this encounter.   Follow up plan: Return if symptoms worsen or fail to improve.  Ria Bush, MD

## 2017-06-17 NOTE — Assessment & Plan Note (Signed)
MMSE stable today. Reviewed with patient and wife. Anticipate he does have some mild cognitive impairment but not yet to level of dementia or affecting daily function. Encouraged regular reading, word puzzles, physical activity and social engagement as ways to combat worsening memory trouble. B12 level recently checked and low normal. Encouraged he start 1000 mcg B12 orally daily and will continue to monitor closely. We briefly discussed medication like aricept. Will reassess at f/u visit and at regular intervals. Pt and wife agree with plan.  He has h/o CVA but memory troubles not quite consistent with vascular dementia.

## 2017-06-17 NOTE — Assessment & Plan Note (Signed)
Appreciate [facility] onc care 

## 2017-06-17 NOTE — Assessment & Plan Note (Signed)
BP supine 140/68 --> 126/60 standing.  Encouraged good hydration status.  Continue to monitor.

## 2017-06-17 NOTE — Patient Instructions (Addendum)
Start B12 1016mcg daily over the counter as your recent levels were low normal.  Consider medicine like aricept (donepezil) for memory.  Continue stimulating brain - reading books, word puzzles, physical activity, social engagement.  Keep July appointment for follow up.

## 2017-06-27 ENCOUNTER — Other Ambulatory Visit: Payer: Self-pay | Admitting: Family Medicine

## 2017-08-14 DIAGNOSIS — D692 Other nonthrombocytopenic purpura: Secondary | ICD-10-CM | POA: Diagnosis not present

## 2017-08-14 DIAGNOSIS — L821 Other seborrheic keratosis: Secondary | ICD-10-CM | POA: Diagnosis not present

## 2017-08-14 DIAGNOSIS — L820 Inflamed seborrheic keratosis: Secondary | ICD-10-CM | POA: Diagnosis not present

## 2017-08-14 DIAGNOSIS — C4441 Basal cell carcinoma of skin of scalp and neck: Secondary | ICD-10-CM | POA: Diagnosis not present

## 2017-08-14 DIAGNOSIS — L578 Other skin changes due to chronic exposure to nonionizing radiation: Secondary | ICD-10-CM | POA: Diagnosis not present

## 2017-08-14 DIAGNOSIS — C44319 Basal cell carcinoma of skin of other parts of face: Secondary | ICD-10-CM | POA: Diagnosis not present

## 2017-08-14 DIAGNOSIS — L570 Actinic keratosis: Secondary | ICD-10-CM | POA: Diagnosis not present

## 2017-09-15 DIAGNOSIS — K118 Other diseases of salivary glands: Secondary | ICD-10-CM | POA: Diagnosis not present

## 2017-09-15 DIAGNOSIS — N189 Chronic kidney disease, unspecified: Secondary | ICD-10-CM | POA: Diagnosis not present

## 2017-09-15 DIAGNOSIS — C9190 Lymphoid leukemia, unspecified not having achieved remission: Secondary | ICD-10-CM | POA: Diagnosis not present

## 2017-09-15 DIAGNOSIS — Z8619 Personal history of other infectious and parasitic diseases: Secondary | ICD-10-CM | POA: Diagnosis not present

## 2017-09-15 DIAGNOSIS — Z85828 Personal history of other malignant neoplasm of skin: Secondary | ICD-10-CM | POA: Diagnosis not present

## 2017-09-15 DIAGNOSIS — E790 Hyperuricemia without signs of inflammatory arthritis and tophaceous disease: Secondary | ICD-10-CM | POA: Diagnosis not present

## 2017-09-15 DIAGNOSIS — I129 Hypertensive chronic kidney disease with stage 1 through stage 4 chronic kidney disease, or unspecified chronic kidney disease: Secondary | ICD-10-CM | POA: Diagnosis not present

## 2017-09-15 DIAGNOSIS — C9110 Chronic lymphocytic leukemia of B-cell type not having achieved remission: Secondary | ICD-10-CM | POA: Diagnosis not present

## 2017-09-15 DIAGNOSIS — Z8673 Personal history of transient ischemic attack (TIA), and cerebral infarction without residual deficits: Secondary | ICD-10-CM | POA: Diagnosis not present

## 2017-09-15 DIAGNOSIS — R221 Localized swelling, mass and lump, neck: Secondary | ICD-10-CM | POA: Diagnosis not present

## 2017-09-15 DIAGNOSIS — Z006 Encounter for examination for normal comparison and control in clinical research program: Secondary | ICD-10-CM | POA: Diagnosis not present

## 2017-09-17 DIAGNOSIS — T07XXXA Unspecified multiple injuries, initial encounter: Secondary | ICD-10-CM | POA: Diagnosis not present

## 2017-09-17 DIAGNOSIS — L578 Other skin changes due to chronic exposure to nonionizing radiation: Secondary | ICD-10-CM | POA: Diagnosis not present

## 2017-09-17 DIAGNOSIS — L570 Actinic keratosis: Secondary | ICD-10-CM | POA: Diagnosis not present

## 2017-09-17 DIAGNOSIS — L508 Other urticaria: Secondary | ICD-10-CM | POA: Diagnosis not present

## 2017-09-24 ENCOUNTER — Ambulatory Visit: Payer: Self-pay | Admitting: Family Medicine

## 2017-09-24 DIAGNOSIS — S30861S Insect bite (nonvenomous) of abdominal wall, sequela: Secondary | ICD-10-CM | POA: Diagnosis not present

## 2017-09-24 DIAGNOSIS — L570 Actinic keratosis: Secondary | ICD-10-CM | POA: Diagnosis not present

## 2017-09-24 NOTE — Telephone Encounter (Signed)
Pt reports intermittent low abdominal pain that radiates down both thighs worse when sneezes or coughs, symptoms started 3 days ago.Being treated by Dermatologist for chigger bites was seen in dermatologist office today and was advised the symptoms were not from any topical creams that he was taking .Denies any nausea vomiting or diarrhea.Appt made for tomorrow am with Dr Danise Mina   Reason for Disposition . [1] MODERATE pain (e.g., interferes with normal activities) AND [2] pain comes and goes (cramps) AND [3] present > 24 hours  (Exception: pain with Vomiting or Diarrhea - see that Guideline)  Answer Assessment - Initial Assessment Questions 1. LOCATION: "Where does it hurt?"      Low abd and down both thighs   2. RADIATION: "Does the pain shoot anywhere else?" (e.g., chest, back)       See above  3. ONSET: "When did the pain begin?" (Minutes, hours or days ago)          3 days ago   4. SUDDEN: "Gradual or sudden onset?"           Sudden onset  3 days ago - better  Now  5. PATTERN "Does the pain come and go, or is it constant?"    - If constant: "Is it getting better, staying the same, or worsening?"      (Note: Constant means the pain never goes away completely; most serious pain is constant and it progresses)     - If intermittent: "How long does it last?" "Do you have pain now?"     (Note: Intermittent means the pain goes away completely between bouts)      Intermittant worse when sneezes or coughs  6. SEVERITY: "How bad is the pain?"  (e.g., Scale 1-10; mild, moderate, or severe)    - MILD (1-3): doesn't interfere with normal activities, abdomen soft and not tender to touch     - MODERATE (4-7): interferes with normal activities or awakens from sleep, tender to touch     - SEVERE (8-10): excruciating pain, doubled over, unable to do any normal activities       None at this time had a pain yesterday when he sneezed   7. RECURRENT SYMPTOM: "Have you ever had this type of abdominal pain  before?" If so, ask: "When was the last time?" and "What happened that time?"      No 8. CAUSE: "What do you think is causing the abdominal pain?"       Pt initially thought it may be to a skin cream from dermatologist had office with dermatologist who advised it was not from  Cream pt was seen for chigger bites  9. RELIEVING/AGGRAVATING FACTORS: "What makes it better or worse?" (e.g., movement, antacids, bowel movement)     Sneezing or coughing makes it worse  releives spontaneously   10. OTHER SYMPTOMS: "Has there been any vomiting, diarrhea, constipation, or urine problems?"       No  Protocols used: ABDOMINAL PAIN - MALE-A-AH

## 2017-09-25 ENCOUNTER — Ambulatory Visit (INDEPENDENT_AMBULATORY_CARE_PROVIDER_SITE_OTHER): Payer: Medicare Other | Admitting: Family Medicine

## 2017-09-25 ENCOUNTER — Encounter: Payer: Self-pay | Admitting: Family Medicine

## 2017-09-25 VITALS — BP 118/68 | HR 73 | Temp 97.8°F | Ht 69.5 in | Wt 165.8 lb

## 2017-09-25 DIAGNOSIS — R1030 Lower abdominal pain, unspecified: Secondary | ICD-10-CM | POA: Diagnosis not present

## 2017-09-25 DIAGNOSIS — N183 Chronic kidney disease, stage 3 unspecified: Secondary | ICD-10-CM

## 2017-09-25 DIAGNOSIS — C9110 Chronic lymphocytic leukemia of B-cell type not having achieved remission: Secondary | ICD-10-CM

## 2017-09-25 DIAGNOSIS — I6523 Occlusion and stenosis of bilateral carotid arteries: Secondary | ICD-10-CM | POA: Diagnosis not present

## 2017-09-25 LAB — POC URINALSYSI DIPSTICK (AUTOMATED)
Bilirubin, UA: NEGATIVE
Blood, UA: NEGATIVE
Glucose, UA: NEGATIVE
Ketones, UA: NEGATIVE
Leukocytes, UA: NEGATIVE
Nitrite, UA: NEGATIVE
Protein, UA: NEGATIVE
Spec Grav, UA: 1.015 (ref 1.010–1.025)
Urobilinogen, UA: 0.2 E.U./dL
pH, UA: 6 (ref 5.0–8.0)

## 2017-09-25 LAB — COMPREHENSIVE METABOLIC PANEL
ALT: 15 U/L (ref 0–53)
AST: 16 U/L (ref 0–37)
Albumin: 4.4 g/dL (ref 3.5–5.2)
Alkaline Phosphatase: 98 U/L (ref 39–117)
BUN: 17 mg/dL (ref 6–23)
CO2: 30 mEq/L (ref 19–32)
Calcium: 9.9 mg/dL (ref 8.4–10.5)
Chloride: 101 mEq/L (ref 96–112)
Creatinine, Ser: 1.27 mg/dL (ref 0.40–1.50)
GFR: 57.08 mL/min — ABNORMAL LOW (ref >60.00)
Glucose, Bld: 108 mg/dL — ABNORMAL HIGH (ref 70–99)
Potassium: 4 mEq/L (ref 3.5–5.1)
Sodium: 137 mEq/L (ref 135–145)
Total Bilirubin: 0.6 mg/dL (ref 0.2–1.2)
Total Protein: 7.2 g/dL (ref 6.0–8.3)

## 2017-09-25 LAB — CBC WITH DIFFERENTIAL/PLATELET
Basophils Absolute: 0 10*3/uL (ref 0.0–0.1)
Basophils Relative: 0.5 % (ref 0.0–3.0)
Eosinophils Absolute: 0.3 10*3/uL (ref 0.0–0.7)
Eosinophils Relative: 4.9 % (ref 0.0–5.0)
HCT: 43.8 % (ref 39.0–52.0)
Hemoglobin: 15 g/dL (ref 13.0–17.0)
Lymphocytes Relative: 15.4 % (ref 12.0–46.0)
Lymphs Abs: 1 10*3/uL (ref 0.7–4.0)
MCHC: 34.2 g/dL (ref 30.0–36.0)
MCV: 97.5 fl (ref 78.0–100.0)
Monocytes Absolute: 0.5 10*3/uL (ref 0.1–1.0)
Monocytes Relative: 8.1 % (ref 3.0–12.0)
Neutro Abs: 4.7 10*3/uL (ref 1.4–7.7)
Neutrophils Relative %: 71.1 % (ref 43.0–77.0)
Platelets: 161 10*3/uL (ref 150.0–400.0)
RBC: 4.49 Mil/uL (ref 4.22–5.81)
RDW: 14.5 % (ref 11.5–15.5)
WBC: 6.6 10*3/uL (ref 4.0–10.5)

## 2017-09-25 NOTE — Patient Instructions (Addendum)
Possible left sided hernia. Labs and urine today Possible hernia - we will be in touch with results of above and may set you up for CT scan.  In the meantime, avoid any heavy lifting or straining.  If worsening pain or nausea/vomiting, seek urgent care.

## 2017-09-25 NOTE — Assessment & Plan Note (Signed)
Update Cr in anticipated possible contrasted CT

## 2017-09-25 NOTE — Addendum Note (Signed)
Addended by: Brenton Grills on: 09/12/8305 35:43 AM   Modules accepted: Orders

## 2017-09-25 NOTE — Assessment & Plan Note (Signed)
MSK exam benign, doubt groin or hip strain. Some fullness and discomfort at L groin - suggesting possible inguinal hernia. Check CBC, BMP, urinalysis today. Low threshold to image - reviewed with patient.

## 2017-09-25 NOTE — Progress Notes (Signed)
BP 118/68 (BP Location: Left Arm, Patient Position: Sitting, Cuff Size: Normal)   Pulse 73   Temp 97.8 F (36.6 C) (Oral)   Ht 5' 9.5" (1.765 m)   Wt 165 lb 12 oz (75.2 kg)   SpO2 95%   BMI 24.13 kg/m    CC: "I think I've strained both hips" Subjective:    [name], male    DOB: 1928-08-24, 84 y.o.   MRN: [id number]  HPI: Jimmy [name] is a 84 y.o. male presenting on 09/25/2017 for Leg Pain (C/o bilateral leg pain in groin area. Thinks he may have pulled something. Felt the pain on 09/22/17 while lifting water bottles.)   4 d h/o bilateral lower abdominal pain radiation to groin and thighs. This this may have happened on Monday 09/22/2017 while lifting 15 liters of water to carry up 5 steps into house (close to 5 gallons) - does this weekly. Didn't feel any pain immediately but later that afternoon noted above soreness. Worse pain going up stairs or lifting the thighs. Severe pain at lower abdomen with cough or sneeze.   Worse with sneezing. No fevers/chills, bowel changes (diarrhea, constipation, blood in stool), urinary changes (dysuria, urgency frequency or hematuria). No nausea/vomiting.   Relevant past medical, surgical, family and social history reviewed and updated as indicated. Interim medical history since our last visit reviewed. Allergies and medications reviewed and updated. Outpatient Medications Prior to Visit  Medication Sig Dispense Refill  . AGGRENOX 25-200 MG 12hr capsule TAKE 1 CAPSULE TWICE A DAY 180 capsule 1  . allopurinol (ZYLOPRIM) 300 MG tablet TAKE 1 TABLET DAILY 90 tablet 1  . cholecalciferol (VITAMIN D) 1000 units tablet Take 2,000 Units by mouth daily.    . diphenhydrAMINE (BENADRYL ALLERGY) 25 mg capsule Take 25 mg by mouth at bedtime.     [name] lisinopril (PRINIVIL,ZESTRIL) 5 MG tablet TAKE 1 TABLET DAILY (NEW DOSE) 90 tablet 1  . nitroGLYCERIN (NITROSTAT) 0.4 MG SL tablet Place 1 tablet (0.4 mg total) under the tongue every 5 (five)  minutes as needed for chest pain (max 3 doses in 15 minutes). 25 tablet 3  . simvastatin (ZOCOR) 10 MG tablet TAKE 1 TABLET AT BEDTIME 90 tablet 1  . valACYclovir (VALTREX) 500 MG tablet TAKE 1 TABLET DAILY 90 tablet 3  . vitamin B-12 (CYANOCOBALAMIN) 1000 MCG tablet Take 1 tablet (1,000 mcg total) by mouth daily.     No facility-administered medications prior to visit.      Per HPI unless specifically indicated in ROS section below Review of Systems     Objective:    BP 118/68 (BP Location: Left Arm, Patient Position: Sitting, Cuff Size: Normal)   Pulse 73   Temp 97.8 F (36.6 C) (Oral)   Ht 5' 9.5" (1.765 m)   Wt 165 lb 12 oz (75.2 kg)   SpO2 95%   BMI 24.13 kg/m   Wt Readings from Last 3 Encounters:  09/25/17 165 lb 12 oz (75.2 kg)  06/17/17 167 lb (75.8 kg)  10/30/16 170 lb (77.1 kg)    Physical Exam  Constitutional: He appears well-developed and well-nourished. No distress.  HENT:  Mouth/Throat: Oropharynx is clear and moist. No oropharyngeal exudate.  Cardiovascular: Normal rate, regular rhythm and normal heart sounds.  No murmur heard. Pulmonary/Chest: Effort normal. No respiratory distress. He has wheezes. He has no rales.  Abdominal: Soft. Bowel sounds are normal. He exhibits no distension and no mass. There is no hepatosplenomegaly. There  is tenderness (mild) in the left lower quadrant. There is no rigidity, no guarding, no CVA tenderness and negative Murphy's sign. Hernia confirmed negative in the right inguinal area. Left inguinal: some fullness and tenderness at L groin.  Genitourinary: Testes normal and penis normal. Right testis shows no mass, no swelling and no tenderness. Right testis is descended. Left testis shows no mass, no swelling and no tenderness. Left testis is descended.  Musculoskeletal: He exhibits no edema.  FROM at bilateral hips without pain  No pain with testing of hip abductors, adductors, flexors against resistance No pain at GTB, sciatic  notch or SIJ bilaterally  Lymphadenopathy: No inguinal adenopathy noted on the right or left side.  Neurological:  5/5 strength BLE  Nursing note and vitals reviewed.  Lab Results  Component Value Date   CREATININE 1.37 10/11/2016   BUN 16 10/11/2016   NA 137 10/11/2016   K 4.6 10/11/2016   CL 101 10/11/2016   CO2 28 10/11/2016       Assessment & Plan:   Problem List Items Addressed This Visit      Chronic   Chronic lymphocytic leukemia, Rai stage 0 ([place])     Other   Lower abdominal pain - Primary    MSK exam benign, doubt groin or hip strain. Some fullness and discomfort at L groin - suggesting possible inguinal hernia. Check CBC, BMP, urinalysis today. Low threshold to image - reviewed with patient.       Relevant Orders   Comprehensive metabolic panel   CBC with Differential/Platelet   Chronic kidney disease, stage 3 (HCC)    Update Cr in anticipated possible contrasted CT          No orders of the defined types were placed in this encounter.  Orders Placed This Encounter  Procedures  . Comprehensive metabolic panel  . CBC with Differential/Platelet    Follow up plan: Return if symptoms worsen or fail to improve.  Ria Bush, MD

## 2017-10-04 ENCOUNTER — Other Ambulatory Visit: Payer: Self-pay | Admitting: Family Medicine

## 2017-10-06 NOTE — Telephone Encounter (Signed)
Ok to refill?  Last prescribed on 04/08/2017  Last seen on 09/25/2017

## 2017-10-10 ENCOUNTER — Other Ambulatory Visit: Payer: Self-pay | Admitting: Family Medicine

## 2017-10-16 ENCOUNTER — Ambulatory Visit: Payer: Medicare Other

## 2017-10-16 ENCOUNTER — Other Ambulatory Visit: Payer: Self-pay | Admitting: Family Medicine

## 2017-10-16 DIAGNOSIS — N183 Chronic kidney disease, stage 3 unspecified: Secondary | ICD-10-CM

## 2017-10-16 DIAGNOSIS — R7303 Prediabetes: Secondary | ICD-10-CM

## 2017-10-16 DIAGNOSIS — E559 Vitamin D deficiency, unspecified: Secondary | ICD-10-CM

## 2017-10-16 DIAGNOSIS — E782 Mixed hyperlipidemia: Secondary | ICD-10-CM

## 2017-10-16 DIAGNOSIS — C9110 Chronic lymphocytic leukemia of B-cell type not having achieved remission: Secondary | ICD-10-CM

## 2017-10-20 ENCOUNTER — Encounter: Payer: Medicare Other | Admitting: Family Medicine

## 2017-10-21 ENCOUNTER — Ambulatory Visit (INDEPENDENT_AMBULATORY_CARE_PROVIDER_SITE_OTHER): Payer: Medicare Other

## 2017-10-21 VITALS — BP 118/68 | HR 71 | Temp 97.7°F | Ht 69.5 in | Wt 168.2 lb

## 2017-10-21 DIAGNOSIS — E782 Mixed hyperlipidemia: Secondary | ICD-10-CM | POA: Diagnosis not present

## 2017-10-21 DIAGNOSIS — N183 Chronic kidney disease, stage 3 unspecified: Secondary | ICD-10-CM

## 2017-10-21 DIAGNOSIS — E559 Vitamin D deficiency, unspecified: Secondary | ICD-10-CM | POA: Diagnosis not present

## 2017-10-21 DIAGNOSIS — R7303 Prediabetes: Secondary | ICD-10-CM

## 2017-10-21 DIAGNOSIS — Z0000 Encounter for general adult medical examination without abnormal findings: Secondary | ICD-10-CM | POA: Diagnosis not present

## 2017-10-21 DIAGNOSIS — C9110 Chronic lymphocytic leukemia of B-cell type not having achieved remission: Secondary | ICD-10-CM | POA: Diagnosis not present

## 2017-10-21 LAB — LIPID PANEL
Cholesterol: 107 mg/dL (ref 0–200)
HDL: 36.8 mg/dL — ABNORMAL LOW (ref >39.00)
LDL Cholesterol: 39 mg/dL (ref 0–99)
NonHDL: 70.28
Total CHOL/HDL Ratio: 3
Triglycerides: 157 mg/dL — ABNORMAL HIGH (ref 0.0–149.0)
VLDL: 31.4 mg/dL (ref 0.0–40.0)

## 2017-10-21 LAB — URIC ACID: Uric Acid, Serum: 4.5 mg/dL (ref 4.0–7.8)

## 2017-10-21 LAB — HEMOGLOBIN A1C: Hgb A1c MFr Bld: 6 % (ref 4.6–6.5)

## 2017-10-21 LAB — VITAMIN D 25 HYDROXY (VIT D DEFICIENCY, FRACTURES): VITD: 53.54 ng/mL (ref 30.00–100.00)

## 2017-10-21 NOTE — Progress Notes (Signed)
Subjective:   ALVAN CULPEPPER is a 84 y.o. male who presents for Medicare Annual/Subsequent preventive examination.  Review of Systems:  N/A Cardiac Risk Factors include: advanced age (>28men, >100 women);dyslipidemia;hypertension;male gender     Objective:    Vitals: BP 118/68 (BP Location: Right Arm, Patient Position: Sitting, Cuff Size: Normal)   Pulse 71   Temp 97.7 F (36.5 C) (Oral)   Ht 5' 9.5" (1.765 m) Comment: no shoes  Wt 168 lb 4 oz (76.3 kg)   SpO2 97%   BMI 24.49 kg/m   Body mass index is 24.49 kg/m.  Advanced Directives 10/21/2017 10/11/2016 07/18/2016 04/08/2016 02/19/2016 09/20/2015  Does Patient Have a Medical Advance Directive? Yes Yes No No No Yes  Type of Paramedic of [place];Living will [place];Living will - - - [place];Living will  Does patient want to make changes to medical advance directive? - No - Patient declined - - - No - Patient declined  Copy of [place] in Chart? No - copy requested Yes - - - Yes    Tobacco Social History   Tobacco Use  Smoking Status Former Smoker  . Packs/day: 2.00  . Years: 30.00  . Pack years: 60.00  . Last attempt to quit: 02/28/1979  . Years since quitting: 38.6  Smokeless Tobacco Never Used  Tobacco Comment   quit 1980     Counseling given: No Comment: quit 1980   Clinical Intake:  Pre-visit preparation completed: Yes  Pain : No/denies pain Pain Score: 0-No pain     Nutritional Status: BMI of 19-24  Normal Nutritional Risks: None Diabetes: No  How often do you need to have someone help you when you read instructions, pamphlets, or other written materials from your doctor or pharmacy?: 1 - Never What is the last grade level you completed in school?: 12th grade + 1 yr college  Interpreter Needed?: No  Comments: pt lives with spouse Information entered by :: LPInson, LPN  Past Medical History:  Diagnosis Date    . CAD (coronary artery disease)    a. 04/2001 S/P CABGx 4;  b. 2008 MV:  EF 63% normal perfusion.  . Carotid stenosis    a. 11/2012 Carotid U/S:  RICA 19-62%, LICA 22-97%.  . Chronic kidney disease, stage 3 ([place])   . CLL (chronic lymphoblastic leukemia) 2009   Stage IV; Dr. Ivor Messier - referred to Dr. Lissa Merlin [facility] 07/2013 now on Rituxan (10/2013) --> stage 0 (09/2014)  . Diastolic CHF ([place])    a. 12/8919 Echo: EF 55-65%, mild conc LVH, Gr 1 DD, mild AS, triv AI, mildly dil Ao root (3.5 cm).  . GERD (gastroesophageal reflux disease) 1990s  . History of CVA (cerebrovascular accident) 2015   by MRI - remote L internal capsule  . History of herpes genitalis    valtrex daily  . Hyperlipemia 2002  . Hypertension 2002  . Mass of submandibular region 2015   referred to gen surg after chemo  . Mild aortic stenosis    a. 07/2011 Echo: Mild AS, triv AI.  [name] Stroke ([place])   . Thrombocytopenia ([place])    outpatient goals Hgb >9, plt >20  . TIA (transient ischemic attack)    04/2016   Past Surgical History:  Procedure Laterality Date  . ANGIOPLASTY  1998  . CATARACT EXTRACTION, BILATERAL     R 1/09, L 8/09  . COLONOSCOPY  11/29/1987   Normal  .  COLONOSCOPY  02/07/2003   Hemm. Internal, focal proctitis, negative biopsy  . COLONOSCOPY  12/12/2004   Internal external hemorrhoids, +proctits, negative biopsy  . CORONARY ANGIOPLASTY  1998  . CORONARY ARTERY BYPASS GRAFT  04/23/2001   x4, Dr. Pia Mau  . ESOPHAGOGASTRODUODENOSCOPY  11/29/1987   Gastritis  . ETT  12/10/2006   Persantine myoview nml  . HEMORROIDECTOMY  1954   Fissure repair, [place]  . HEPATIC ARTERY ANGIOPLASTY  1954   [place], [place]  . KIDNEY STONE SURGERY  1977   Dr Redmond Baseman  . LITHOTRIPSY  1990s   Multiple  . US ECHOCARDIOGRAPHY  07/2011   nl sys fxn, EF 55-60%, grade 1 diast dysfunction, mild AS, mildly elevated PA pressure   Family History  Problem Relation Age of Onset  . Hypertension Mother   . Heart disease Mother   .  Hyperlipidemia Mother   . Hypertension Father   . Hyperlipidemia Father   . Kidney cancer Sister        Renal cell cancer  . Alcohol abuse Brother   . Diabetes Brother   . Stroke Brother   . Heart attack Brother        MI  . Diabetes Other        Sister's daughter  . Depression Daughter        Bipolar   Social History   Socioeconomic History  . Marital status: Married    Spouse name: Not on file  . Number of children: Not on file  . Years of education: Not on file  . Highest education level: Not on file  Occupational History  . Occupation: Retired Information systems manager: RETIRED  Social Needs  . Financial resource strain: Not on file  . Food insecurity:    Worry: Not on file    Inability: Not on file  . Transportation needs:    Medical: Not on file    Non-medical: Not on file  Tobacco Use  . Smoking status: Former Smoker    Packs/day: 2.00    Years: 30.00    Pack years: 60.00    Last attempt to quit: 02/28/1979    Years since quitting: 38.6  . Smokeless tobacco: Never Used  . Tobacco comment: quit 1980  Substance and Sexual Activity  . Alcohol use: No    Alcohol/week: 0.0 oz    Comment: occasional wine  . Drug use: No  . Sexual activity: Yes  Lifestyle  . Physical activity:    Days per week: Not on file    Minutes per session: Not on file  . Stress: Not on file  Relationships  . Social connections:    Talks on phone: Not on file    Gets together: Not on file    Attends religious service: Not on file    Active member of club or organization: Not on file    Attends meetings of clubs or organizations: Not on file    Relationship status: Not on file  Other Topics Concern  . Not on file  Social History Narrative   Married and lives with wife   1 son died of lung cancer at 43 years old   29 daughter bipolar, lives    Activity: walks 1 mi daily on treadmill    Diet: good water, some fruits/vegetables     Outpatient Encounter Medications as of 10/21/2017    Medication Sig  . allopurinol (ZYLOPRIM) 300 MG tablet TAKE 1 TABLET DAILY  . cholecalciferol (VITAMIN  D) 1000 units tablet Take 2,000 Units by mouth daily.  . diphenhydrAMINE (BENADRYL ALLERGY) 25 mg capsule Take 25 mg by mouth at bedtime.   . dipyridamole-aspirin (AGGRENOX) 200-25 MG 12hr capsule TAKE 1 CAPSULE TWICE A DAY  . lisinopril (PRINIVIL,ZESTRIL) 5 MG tablet TAKE 1 TABLET DAILY (NEW DOSE)  . nitroGLYCERIN (NITROSTAT) 0.4 MG SL tablet Place 1 tablet (0.4 mg total) under the tongue every 5 (five) minutes as needed for chest pain (max 3 doses in 15 minutes).  . simvastatin (ZOCOR) 10 MG tablet TAKE 1 TABLET AT BEDTIME  . valACYclovir (VALTREX) 500 MG tablet TAKE 1 TABLET DAILY  . vitamin B-12 (CYANOCOBALAMIN) 1000 MCG tablet Take 1 tablet (1,000 mcg total) by mouth daily.   No facility-administered encounter medications on file as of 10/21/2017.     Activities of Daily Living In your present state of health, do you have any difficulty performing the following activities: 10/21/2017  Hearing? Y  Vision? Y  Difficulty concentrating or making decisions? Y  Walking or climbing stairs? Y  Dressing or bathing? N  Doing errands, shopping? N  Preparing Food and eating ? N  Using the Toilet? N  In the past six months, have you accidently leaked urine? N  Do you have problems with loss of bowel control? N  Managing your Medications? N  Managing your Finances? N  Housekeeping or managing your Housekeeping? N  Some recent data might be hidden    Patient Care Team: Ria Bush, MD as PCP - General (Family Medicine) Rockey Situ, Kathlene November, MD as Consulting Physician (Cardiology) Ralene Bathe, MD as Consulting Physician (Dermatology) Luci Bank, MD as Consulting Physician (Oncology) Garvin Fila, MD as Consulting Physician (Neurology)   Assessment:   This is a routine wellness examination for [place].  Hearing Screening Comments: Bilateral hearing aids Vision Screening  Comments: Vision exam in 2019 with Dr. Sandra Cockayne   Exercise Activities and Dietary recommendations Current Exercise Habits: Home exercise routine, Type of exercise: strength training/weights;Other - see comments(stationary bike), Time (Minutes): 30, Frequency (Times/Week): 7, Weekly Exercise (Minutes/Week): 210, Intensity: Moderate, Exercise limited by: None identified  Goals    . Patient Stated     Starting 10/21/2017, I will continue to take medications as prescribed.        Fall Risk Fall Risk  10/21/2017 10/30/2016 10/11/2016 05/02/2016 09/20/2015  Falls in the past year? No No No No No   Depression Screen PHQ 2/9 Scores 10/21/2017 10/11/2016 09/20/2015 09/16/2014  PHQ - 2 Score 0 0 0 0  PHQ- 9 Score 0 - - -    Cognitive Function MMSE - Mini Mental State Exam 10/21/2017 10/11/2016 09/20/2015  Orientation to time 5 5 5   Orientation to Place 5 5 5   Registration 3 3 3   Attention/ Calculation 0 0 0  Recall 3 2 3   Language- name 2 objects 0 0 0  Language- repeat 1 1 1   Language- follow 3 step command 3 3 3   Language- read & follow direction 0 0 0  Write a sentence 0 0 0  Copy design 0 0 0  Total score 20 19 20      PLEASE NOTE: A Mini-Cog screen was completed. Maximum score is 20. A value of 0 denotes this part of [place] MMSE was not completed or the patient failed this part of the Mini-Cog screening.   Mini-Cog Screening Orientation to Time - Max 5 pts Orientation to Place - Max 5 pts Registration - Max 3 pts Recall -  Max 3 pts Language Repeat - Max 1 pts Language Follow 3 Step Command - Max 3 pts     Immunization History  Administered Date(s) Administered  . Influenza Split 03/01/2011  . Influenza Whole 01/09/2004, 01/16/2007, 12/21/2007, 01/04/2010  . Influenza,inj,Quad PF,6+ Mos 01/07/2013, 12/29/2013, 01/02/2015, 01/01/2016, 12/31/2016  . Pneumococcal Conjugate-13 08/30/2013  . Pneumococcal Polysaccharide-23 04/01/1997  . Td 04/02/1995, 12/04/2005  . Zoster 09/19/2006     Screening Tests Health Maintenance  Topic Date Due  . DTaP/Tdap/Td (1 - Tdap) 10/22/2018 (Originally 12/05/2005)  . TETANUS/TDAP  10/22/2018 (Originally 12/05/2015)  . INFLUENZA VACCINE  10/30/2017  . PNA vac Low Risk Adult  Completed       Plan:     I have personally reviewed, addressed, and noted the following in the patient's chart:  A. Medical and social history B. Use of alcohol, tobacco or illicit drugs  C. Current medications and supplements D. Functional ability and status E.  Nutritional status F.  Physical activity G. Advance directives H. List of other physicians I.  Hospitalizations, surgeries, and ER visits in previous 12 months J.  [place] to include hearing, vision, cognitive, depression L. Referrals and appointments - none  In addition, I have reviewed and discussed with patient certain preventive protocols, quality metrics, and best practice recommendations. A written personalized care plan for preventive services as well as general preventive health recommendations were provided to patient.  See attached scanned questionnaire for additional information.   Signed,   Lindell Noe, MHA, BS, LPN Health Coach

## 2017-10-21 NOTE — Progress Notes (Signed)
PCP notes:   Health maintenance:  Tetanus vaccine - postponed/insurance  Abnormal screenings:   None  Patient concerns:   None  Nurse concerns:  None  Next PCP appt:   10/28/17 @ 0930  I reviewed health advisor's note, was available for consultation on the day of service listed in this note, and agree with documentation and plan. Elsie Stain, MD.

## 2017-10-21 NOTE — Patient Instructions (Signed)
Jimmy [name] , Thank you for taking time to come for your Medicare Wellness Visit. I appreciate your ongoing commitment to your health goals. Please review the following plan we discussed and let me know if I can assist you in the future.   These are the goals we discussed: Goals    . Patient Stated     Starting 10/21/2017, I will continue to take medications as prescribed.        This is a list of the screening recommended for you and due dates:  Health Maintenance  Topic Date Due  . DTaP/Tdap/Td vaccine (1 - Tdap) 10/22/2018*  . Tetanus Vaccine  10/22/2018*  . Flu Shot  10/30/2017  . Pneumonia vaccines  Completed  *Topic was postponed. The date shown is not the original due date.   Preventive Care for Adults  A healthy lifestyle and preventive care can promote health and wellness. Preventive health guidelines for adults include the following key practices.  . A routine yearly physical is a good way to check with your health care provider about your health and preventive screening. It is a chance to share any concerns and updates on your health and to receive a thorough exam.  . Visit your dentist for a routine exam and preventive care every 6 months. Brush your teeth twice a day and floss once a day. Good oral hygiene prevents tooth decay and gum disease.  . The frequency of eye exams is based on your age, health, family medical history, use  of contact lenses, and other factors. Follow your health care provider's recommendations for frequency of eye exams.  . Eat a healthy diet. Foods like vegetables, fruits, whole grains, low-fat dairy products, and lean protein foods contain the nutrients you need without too many calories. Decrease your intake of foods high in solid fats, added sugars, and salt. Eat the right amount of calories for you. Get information about a proper diet from your health care provider, if necessary.  . Regular physical exercise is one of the most important  things you can do for your health. Most adults should get at least 150 minutes of moderate-intensity exercise (any activity that increases your heart rate and causes you to sweat) each week. In addition, most adults need muscle-strengthening exercises on 2 or more days a week.  Silver Sneakers may be a benefit available to you. To determine eligibility, you may visit the website: www.silversneakers.com or contact program at 217-317-9988 Mon-Fri between 8AM-8PM.   . Maintain a healthy weight. The body mass index (BMI) is a screening tool to identify possible weight problems. It provides an estimate of body fat based on height and weight. Your health care provider can find your BMI and can help you achieve or maintain a healthy weight.   For adults 20 years and older: ? A BMI below 18.5 is considered underweight. ? A BMI of 18.5 to 24.9 is normal. ? A BMI of 25 to 29.9 is considered overweight. ? A BMI of 30 and above is considered obese.   . Maintain normal blood lipids and cholesterol levels by exercising and minimizing your intake of saturated fat. Eat a balanced diet with plenty of fruit and vegetables. Blood tests for lipids and cholesterol should begin at age 58 and be repeated every 5 years. If your lipid or cholesterol levels are high, you are over 50, or you are at high risk for heart disease, you may need your cholesterol levels checked more frequently. Ongoing high lipid  and cholesterol levels should be treated with medicines if diet and exercise are not working.  . If you smoke, find out from your health care provider how to quit. If you do not use tobacco, please do not start.  . If you choose to drink alcohol, please do not consume more than 2 drinks per day. One drink is considered to be 12 ounces (355 mL) of beer, 5 ounces (148 mL) of wine, or 1.5 ounces (44 mL) of liquor.  . If you are 54-4 years old, ask your health care provider if you should take aspirin to prevent  strokes.  . Use sunscreen. Apply sunscreen liberally and repeatedly throughout the day. You should seek shade when your shadow is shorter than you. Protect yourself by wearing long sleeves, pants, a wide-brimmed hat, and sunglasses year round, whenever you are outdoors.  . Once a month, do a whole body skin exam, using a mirror to look at the skin on your back. Tell your health care provider of new moles, moles that have irregular borders, moles that are larger than a pencil eraser, or moles that have changed in shape or color.

## 2017-10-28 ENCOUNTER — Encounter: Payer: Self-pay | Admitting: Family Medicine

## 2017-10-28 ENCOUNTER — Ambulatory Visit (INDEPENDENT_AMBULATORY_CARE_PROVIDER_SITE_OTHER): Payer: Medicare Other | Admitting: Family Medicine

## 2017-10-28 VITALS — BP 118/66 | HR 68 | Temp 97.8°F | Ht 69.5 in | Wt 167.2 lb

## 2017-10-28 DIAGNOSIS — I6523 Occlusion and stenosis of bilateral carotid arteries: Secondary | ICD-10-CM

## 2017-10-28 DIAGNOSIS — E782 Mixed hyperlipidemia: Secondary | ICD-10-CM | POA: Diagnosis not present

## 2017-10-28 DIAGNOSIS — C9110 Chronic lymphocytic leukemia of B-cell type not having achieved remission: Secondary | ICD-10-CM | POA: Diagnosis not present

## 2017-10-28 DIAGNOSIS — I10 Essential (primary) hypertension: Secondary | ICD-10-CM

## 2017-10-28 DIAGNOSIS — E559 Vitamin D deficiency, unspecified: Secondary | ICD-10-CM | POA: Diagnosis not present

## 2017-10-28 DIAGNOSIS — Z7189 Other specified counseling: Secondary | ICD-10-CM

## 2017-10-28 DIAGNOSIS — R7303 Prediabetes: Secondary | ICD-10-CM

## 2017-10-28 DIAGNOSIS — I6529 Occlusion and stenosis of unspecified carotid artery: Secondary | ICD-10-CM | POA: Diagnosis not present

## 2017-10-28 DIAGNOSIS — G3184 Mild cognitive impairment, so stated: Secondary | ICD-10-CM | POA: Diagnosis not present

## 2017-10-28 DIAGNOSIS — I350 Nonrheumatic aortic (valve) stenosis: Secondary | ICD-10-CM | POA: Diagnosis not present

## 2017-10-28 DIAGNOSIS — G459 Transient cerebral ischemic attack, unspecified: Secondary | ICD-10-CM | POA: Diagnosis not present

## 2017-10-28 NOTE — Assessment & Plan Note (Signed)
Levels remaining well controlled on 2000 IU daily.

## 2017-10-28 NOTE — Assessment & Plan Note (Signed)
Advanced directive planning: Form reviewed and scanned into chart - brings living will desiring no extraordinary life prolonging measures (08/2014). Does not include HCPOA form. Form provided today. Would want wife [name] to be HCPOA.

## 2017-10-28 NOTE — Assessment & Plan Note (Signed)
Stable period. No change in level of care need.

## 2017-10-28 NOTE — Assessment & Plan Note (Signed)
Appreciate [facility] onc care

## 2017-10-28 NOTE — Assessment & Plan Note (Signed)
Chronic, stable. Continue current regimen. 

## 2017-10-28 NOTE — Assessment & Plan Note (Signed)
Encouraged avoiding added sugars.  

## 2017-10-28 NOTE — Assessment & Plan Note (Signed)
Chronic, stable. Continue low dose simvastatin.

## 2017-10-28 NOTE — Patient Instructions (Addendum)
You are doing well today Return as needed or in 6 months for follow up visit.   Health Maintenance, Male A healthy lifestyle and preventive care is important for your health and wellness. Ask your health care provider about what schedule of regular examinations is right for you. What should I know about weight and diet? Eat a Healthy Diet  Eat plenty of vegetables, fruits, whole grains, low-fat dairy products, and lean protein.  Do not eat a lot of foods high in solid fats, added sugars, or salt.  Maintain a Healthy Weight Regular exercise can help you achieve or maintain a healthy weight. You should:  Do at least 150 minutes of exercise each week. The exercise should increase your heart rate and make you sweat (moderate-intensity exercise).  Do strength-training exercises at least twice a week.  Watch Your Levels of Cholesterol and Blood Lipids  Have your blood tested for lipids and cholesterol every 5 years starting at 82 years of age. If you are at high risk for heart disease, you should start having your blood tested when you are 82 years old. You may need to have your cholesterol levels checked more often if: ? Your lipid or cholesterol levels are high. ? You are older than 82 years of age. ? You are at high risk for heart disease.  What should I know about cancer screening? Many types of cancers can be detected early and may often be prevented. Lung Cancer  You should be screened every year for lung cancer if: ? You are a current smoker who has smoked for at least 30 years. ? You are a former smoker who has quit within the past 15 years.  Talk to your health care provider about your screening options, when you should start screening, and how often you should be screened.  Colorectal Cancer  Routine colorectal cancer screening usually begins at 82 years of age and should be repeated every 5-10 years until you are 82 years old. You may need to be screened more often if early  forms of precancerous polyps or small growths are found. Your health care provider may recommend screening at an earlier age if you have risk factors for colon cancer.  Your health care provider may recommend using home test kits to check for hidden blood in the stool.  A small camera at the end of a tube can be used to examine your colon (sigmoidoscopy or colonoscopy). This checks for the earliest forms of colorectal cancer.  Prostate and Testicular Cancer  Depending on your age and overall health, your health care provider may do certain tests to screen for prostate and testicular cancer.  Talk to your health care provider about any symptoms or concerns you have about testicular or prostate cancer.  Skin Cancer  Check your skin from head to toe regularly.  Tell your health care provider about any new moles or changes in moles, especially if: ? There is a change in a mole's size, shape, or color. ? You have a mole that is larger than a pencil eraser.  Always use sunscreen. Apply sunscreen liberally and repeat throughout the day.  Protect yourself by wearing long sleeves, pants, a wide-brimmed hat, and sunglasses when outside.  What should I know about heart disease, diabetes, and high blood pressure?  If you are 69-30 years of age, have your blood pressure checked every 3-5 years. If you are 70 years of age or older, have your blood pressure checked every year. You  should have your blood pressure measured twice-once when you are at a hospital or clinic, and once when you are not at a hospital or clinic. Record the average of the two measurements. To check your blood pressure when you are not at a hospital or clinic, you can use: ? An automated blood pressure machine at a pharmacy. ? A home blood pressure monitor.  Talk to your health care provider about your target blood pressure.  If you are between 43-11 years old, ask your health care provider if you should take aspirin to prevent  heart disease.  Have regular diabetes screenings by checking your fasting blood sugar level. ? If you are at a normal weight and have a low risk for diabetes, have this test once every three years after the age of 72. ? If you are overweight and have a high risk for diabetes, consider being tested at a younger age or more often.  A one-time screening for abdominal aortic aneurysm (AAA) by ultrasound is recommended for men aged 23-75 years who are current or former smokers. What should I know about preventing infection? Hepatitis B If you have a higher risk for hepatitis B, you should be screened for this virus. Talk with your health care provider to find out if you are at risk for hepatitis B infection. Hepatitis C Blood testing is recommended for:  Everyone born from [birth year] through 1963.  Anyone with known risk factors for hepatitis C.  Sexually Transmitted Diseases (STDs)  You should be screened each year for STDs including gonorrhea and chlamydia if: ? You are sexually active and are younger than 82 years of age. ? You are older than 82 years of age and your health care provider tells you that you are at risk for this type of infection. ? Your sexual activity has changed since you were last screened and you are at an increased risk for chlamydia or gonorrhea. Ask your health care provider if you are at risk.  Talk with your health care provider about whether you are at high risk of being infected with HIV. Your health care provider may recommend a prescription medicine to help prevent HIV infection.  What else can I do?  Schedule regular health, dental, and eye exams.  Stay current with your vaccines (immunizations).  Do not use any tobacco products, such as cigarettes, chewing tobacco, and e-cigarettes. If you need help quitting, ask your health care provider.  Limit alcohol intake to no more than 2 drinks per day. One drink equals 12 ounces of beer, 5 ounces of wine, or 1 ounces  of hard liquor.  Do not use street drugs.  Do not share needles.  Ask your health care provider for help if you need support or information about quitting drugs.  Tell your health care provider if you often feel depressed.  Tell your health care provider if you have ever been abused or do not feel safe at home. This information is not intended to replace advice given to you by your health care provider. Make sure you discuss any questions you have with your health care provider. Document Released: 09/14/2007 Document Revised: 11/15/2015 Document Reviewed: 12/20/2014 Elsevier Interactive Patient Education  [employer].

## 2017-10-28 NOTE — Assessment & Plan Note (Signed)
Known carotid stenosis, followed by cards. Latest [place] 04/2017.

## 2017-10-28 NOTE — Progress Notes (Signed)
BP 118/66 (BP Location: Left Arm, Patient Position: Sitting, Cuff Size: Normal)   Pulse 68   Temp 97.8 F (36.6 C) (Oral)   Ht 5' 9.5" (1.765 m)   Wt 167 lb 4 oz (75.9 kg)   SpO2 96%   BMI 24.34 kg/m    CC: AMW f/u visit Subjective:    [name], male    DOB: 05/09/1928, 84 y.o.   MRN: [id number]  HPI: Jimmy [name] is a 84 y.o. male presenting on 10/28/2017 for Annual Exam (Pt 2.)   Saw Katha Cabal last week for medicare wellness visit. Note reviewed.    ?L inguinal hernia - pt declined CT for further evaluation. Pt states bilateral thigh pain has fully resolved.   CLL stage 0 - followed by [facility]. Stable period. Seeing every 3 months.   Known CKD, CAD s/p 4v CABG 2001, HLD and HTN, prior TIA initially on plavix, now on aggrenox after TIA recurrence.   MCI - stable period. Denies significant memory changes at this time.  Denies urinary incontinence, notes slow stream.  Occasional constipation, diarrhea.   Preventative: Aged out of screens Immunizations UTD Shingrix - discussed, declines.  Advanced directive planning: Form reviewed and scanned into chart - brings living will desiring no extraordinary life prolonging measures (08/2014). Does not include HCPOA form. Form provided today. Would want wife [name] to be HCPOA.  Seat belt use discussed.  Sunscreen use discussed (never outside), no changing moles on skin. Sees dermatologist tomorrow.  Ex smoker - quit 1980 Alcohol - none Dentist - Q6 mo Eye exam - yearly  Married and lives with wife 1 son died of lung cancer at 17 years old 3 daughter bipolar, lives  Activity: stationary bicycle 20 min/day  Diet: good water, some fruits/vegetables   Relevant past medical, surgical, family and social history reviewed and updated as indicated. Interim medical history since our last visit reviewed. Allergies and medications reviewed and updated. Outpatient Medications Prior to Visit  Medication Sig Dispense  Refill  . allopurinol (ZYLOPRIM) 300 MG tablet TAKE 1 TABLET DAILY 90 tablet 1  . cholecalciferol (VITAMIN D) 1000 units tablet Take 2,000 Units by mouth daily.    . diphenhydrAMINE (BENADRYL ALLERGY) 25 mg capsule Take 25 mg by mouth at bedtime.     . dipyridamole-aspirin (AGGRENOX) 200-25 MG 12hr capsule TAKE 1 CAPSULE TWICE A DAY 180 capsule 1  . lisinopril (PRINIVIL,ZESTRIL) 5 MG tablet TAKE 1 TABLET DAILY (NEW DOSE) 90 tablet 1  . nitroGLYCERIN (NITROSTAT) 0.4 MG SL tablet Place 1 tablet (0.4 mg total) under the tongue every 5 (five) minutes as needed for chest pain (max 3 doses in 15 minutes). 25 tablet 3  . simvastatin (ZOCOR) 10 MG tablet TAKE 1 TABLET AT BEDTIME 90 tablet 0  . valACYclovir (VALTREX) 500 MG tablet TAKE 1 TABLET DAILY 90 tablet 3  . vitamin B-12 (CYANOCOBALAMIN) 1000 MCG tablet Take 1 tablet (1,000 mcg total) by mouth daily.     No facility-administered medications prior to visit.      Per HPI unless specifically indicated in ROS section below Review of Systems     Objective:    BP 118/66 (BP Location: Left Arm, Patient Position: Sitting, Cuff Size: Normal)   Pulse 68   Temp 97.8 F (36.6 C) (Oral)   Ht 5' 9.5" (1.765 m)   Wt 167 lb 4 oz (75.9 kg)   SpO2 96%   BMI 24.34 kg/m   Wt Readings from Last  3 Encounters:  10/28/17 167 lb 4 oz (75.9 kg)  10/21/17 168 lb 4 oz (76.3 kg)  09/25/17 165 lb 12 oz (75.2 kg)    Physical Exam  Constitutional: He is oriented to person, place, and time. He appears well-developed and well-nourished. No distress.  HENT:  Head: Normocephalic and atraumatic.  Right Ear: Hearing, tympanic membrane, external ear and ear canal normal.  Left Ear: Hearing, tympanic membrane, external ear and ear canal normal.  Nose: Nose normal.  Mouth/Throat: Uvula is midline, oropharynx is clear and moist and mucous membranes are normal. No oropharyngeal exudate, posterior oropharyngeal edema or posterior oropharyngeal erythema.  Eyes: Pupils  are equal, round, and reactive to light. Conjunctivae and EOM are normal. No scleral icterus.  Neck: Normal range of motion. Neck supple. Carotid bruit is present (R sided).  Cardiovascular: Normal rate, regular rhythm and intact distal pulses.  Murmur (2/6 systolic LUSB) heard. Pulses:      Radial pulses are 2+ on the right side, and 2+ on the left side.  Pulmonary/Chest: Effort normal and breath sounds normal. No respiratory distress. He has no wheezes. He has no rales.  Abdominal: Soft. Bowel sounds are normal. He exhibits no distension and no mass. There is no tenderness. There is no rebound and no guarding.  Musculoskeletal: Normal range of motion. He exhibits no edema.  Lymphadenopathy:    He has no cervical adenopathy.  Neurological: He is alert and oriented to person, place, and time.  CN grossly intact, station and gait intact  Skin: Skin is warm and dry. No rash noted.  Psychiatric: He has a normal mood and affect. His behavior is normal. Judgment and thought content normal.  Nursing note and vitals reviewed.  Results for orders placed or performed in visit on 10/21/17  Uric acid  Result Value Ref Range   Uric Acid, Serum 4.5 4.0 - 7.8 mg/dL  VITAMIN D 25 Hydroxy (Vit-D Deficiency, Fractures)  Result Value Ref Range   VITD 53.54 30.00 - 100.00 ng/mL  Hemoglobin A1c  Result Value Ref Range   Hgb A1c MFr Bld 6.0 4.6 - 6.5 %  Lipid panel  Result Value Ref Range   Cholesterol 107 0 - 200 mg/dL   Triglycerides 157.0 (H) 0.0 - 149.0 mg/dL   HDL 36.80 (L) >39.00 mg/dL   VLDL 31.4 0.0 - 40.0 mg/dL   LDL Cholesterol 39 0 - 99 mg/dL   Total CHOL/HDL Ratio 3    NonHDL 70.28    Lab Results  Component Value Date   CREATININE 1.27 09/25/2017   BUN 17 09/25/2017   NA 137 09/25/2017   K 4.0 09/25/2017   CL 101 09/25/2017   CO2 30 09/25/2017       Assessment & Plan:   Problem List Items Addressed This Visit      Chronic   Chronic lymphocytic leukemia, Rai stage 0 ([place])     Appreciate [facility] onc care        Other   Vitamin D deficiency    Levels remaining well controlled on 2000 IU daily.      TIA (transient ischemic attack)    Continue aggrenox.  Remote lacunar infarcts by prior imaging.  TIA 01/2016, recurrent 04/2016.       Prediabetes    Encouraged avoiding added sugars.       Mixed hyperlipidemia    Chronic, stable. Continue low dose simvastatin.       MCI (mild cognitive impairment) with memory loss  Stable period. No change in level of care need.       Essential hypertension    Chronic, stable. Continue current regimen.       Carotid artery stenosis    Known carotid stenosis, followed by cards. Latest [place] 04/2017.       Aortic stenosis    Stable exam.       Advanced care planning/counseling discussion - Primary    Advanced directive planning: Form reviewed and scanned into chart - brings living will desiring no extraordinary life prolonging measures (08/2014). Does not include HCPOA form. Form provided today. Would want wife [name] to be HCPOA.           No orders of the defined types were placed in this encounter.  No orders of the defined types were placed in this encounter.   Follow up plan: Return in about 6 months (around 04/30/2018) for follow up visit.  Ria Bush, MD

## 2017-10-28 NOTE — Assessment & Plan Note (Signed)
Continue aggrenox.  Remote lacunar infarcts by prior imaging.  TIA 01/2016, recurrent 04/2016.

## 2017-10-28 NOTE — Assessment & Plan Note (Signed)
Stable exam.

## 2017-10-29 DIAGNOSIS — L570 Actinic keratosis: Secondary | ICD-10-CM | POA: Diagnosis not present

## 2017-10-29 DIAGNOSIS — L578 Other skin changes due to chronic exposure to nonionizing radiation: Secondary | ICD-10-CM | POA: Diagnosis not present

## 2017-10-29 DIAGNOSIS — L820 Inflamed seborrheic keratosis: Secondary | ICD-10-CM | POA: Diagnosis not present

## 2017-10-29 DIAGNOSIS — L821 Other seborrheic keratosis: Secondary | ICD-10-CM | POA: Diagnosis not present

## 2017-10-31 ENCOUNTER — Other Ambulatory Visit: Payer: Self-pay | Admitting: Family Medicine

## 2017-12-17 ENCOUNTER — Other Ambulatory Visit: Payer: Self-pay | Admitting: Family Medicine

## 2017-12-18 NOTE — Telephone Encounter (Signed)
Valtrex Last filled:  10/04/17, #90 Last OV:  10/28/17 Next OV:  11/04/18, CPE

## 2017-12-24 ENCOUNTER — Other Ambulatory Visit: Payer: Self-pay | Admitting: Family Medicine

## 2017-12-29 DIAGNOSIS — K119 Disease of salivary gland, unspecified: Secondary | ICD-10-CM | POA: Diagnosis not present

## 2017-12-29 DIAGNOSIS — E790 Hyperuricemia without signs of inflammatory arthritis and tophaceous disease: Secondary | ICD-10-CM | POA: Diagnosis not present

## 2017-12-29 DIAGNOSIS — N189 Chronic kidney disease, unspecified: Secondary | ICD-10-CM | POA: Diagnosis not present

## 2017-12-29 DIAGNOSIS — Z8619 Personal history of other infectious and parasitic diseases: Secondary | ICD-10-CM | POA: Diagnosis not present

## 2017-12-29 DIAGNOSIS — Z006 Encounter for examination for normal comparison and control in clinical research program: Secondary | ICD-10-CM | POA: Diagnosis not present

## 2017-12-29 DIAGNOSIS — I129 Hypertensive chronic kidney disease with stage 1 through stage 4 chronic kidney disease, or unspecified chronic kidney disease: Secondary | ICD-10-CM | POA: Diagnosis not present

## 2017-12-29 DIAGNOSIS — C9110 Chronic lymphocytic leukemia of B-cell type not having achieved remission: Secondary | ICD-10-CM | POA: Diagnosis not present

## 2017-12-29 DIAGNOSIS — C9190 Lymphoid leukemia, unspecified not having achieved remission: Secondary | ICD-10-CM | POA: Diagnosis not present

## 2017-12-29 DIAGNOSIS — Z85828 Personal history of other malignant neoplasm of skin: Secondary | ICD-10-CM | POA: Diagnosis not present

## 2017-12-29 DIAGNOSIS — J069 Acute upper respiratory infection, unspecified: Secondary | ICD-10-CM | POA: Diagnosis not present

## 2018-01-08 ENCOUNTER — Other Ambulatory Visit: Payer: Self-pay | Admitting: Family Medicine

## 2018-01-15 ENCOUNTER — Ambulatory Visit (INDEPENDENT_AMBULATORY_CARE_PROVIDER_SITE_OTHER): Payer: Medicare Other

## 2018-01-15 DIAGNOSIS — Z23 Encounter for immunization: Secondary | ICD-10-CM

## 2018-04-02 ENCOUNTER — Other Ambulatory Visit: Payer: Self-pay | Admitting: Family Medicine

## 2018-04-02 NOTE — Telephone Encounter (Signed)
CPE scheduled on 11/04/18 and last CPE was was on 10/28/17, last filled on 10/07/17 #180 caps with 1 additional refill, please advise

## 2018-04-04 ENCOUNTER — Inpatient Hospital Stay (HOSPITAL_COMMUNITY): Payer: Medicare Other

## 2018-04-04 ENCOUNTER — Inpatient Hospital Stay (HOSPITAL_COMMUNITY)
Admission: EM | Admit: 2018-04-04 | Discharge: 2018-04-07 | DRG: 065 | Disposition: A | Discharge status: Rehabilitation Facility | Payer: Medicare Other | Attending: Neurology | Admitting: Neurology

## 2018-04-04 ENCOUNTER — Other Ambulatory Visit: Payer: Self-pay

## 2018-04-04 ENCOUNTER — Emergency Department (HOSPITAL_COMMUNITY): Payer: Medicare Other

## 2018-04-04 DIAGNOSIS — Z823 Family history of stroke: Secondary | ICD-10-CM | POA: Diagnosis not present

## 2018-04-04 DIAGNOSIS — R1310 Dysphagia, unspecified: Secondary | ICD-10-CM | POA: Diagnosis present

## 2018-04-04 DIAGNOSIS — R0902 Hypoxemia: Secondary | ICD-10-CM | POA: Diagnosis not present

## 2018-04-04 DIAGNOSIS — R531 Weakness: Secondary | ICD-10-CM | POA: Diagnosis not present

## 2018-04-04 DIAGNOSIS — Z87442 Personal history of urinary calculi: Secondary | ICD-10-CM | POA: Diagnosis not present

## 2018-04-04 DIAGNOSIS — K5900 Constipation, unspecified: Secondary | ICD-10-CM | POA: Diagnosis present

## 2018-04-04 DIAGNOSIS — R4702 Dysphasia: Secondary | ICD-10-CM | POA: Diagnosis present

## 2018-04-04 DIAGNOSIS — R29710 NIHSS score 10: Secondary | ICD-10-CM | POA: Diagnosis present

## 2018-04-04 DIAGNOSIS — Z8349 Family history of other endocrine, nutritional and metabolic diseases: Secondary | ICD-10-CM | POA: Diagnosis not present

## 2018-04-04 DIAGNOSIS — I169 Hypertensive crisis, unspecified: Secondary | ICD-10-CM | POA: Diagnosis present

## 2018-04-04 DIAGNOSIS — R1312 Dysphagia, oropharyngeal phase: Secondary | ICD-10-CM | POA: Diagnosis not present

## 2018-04-04 DIAGNOSIS — I610 Nontraumatic intracerebral hemorrhage in hemisphere, subcortical: Principal | ICD-10-CM | POA: Diagnosis present

## 2018-04-04 DIAGNOSIS — I639 Cerebral infarction, unspecified: Secondary | ICD-10-CM | POA: Diagnosis not present

## 2018-04-04 DIAGNOSIS — N183 Chronic kidney disease, stage 3 (moderate): Secondary | ICD-10-CM | POA: Diagnosis present

## 2018-04-04 DIAGNOSIS — M109 Gout, unspecified: Secondary | ICD-10-CM | POA: Diagnosis present

## 2018-04-04 DIAGNOSIS — G8194 Hemiplegia, unspecified affecting left nondominant side: Secondary | ICD-10-CM | POA: Diagnosis present

## 2018-04-04 DIAGNOSIS — I69354 Hemiplegia and hemiparesis following cerebral infarction affecting left non-dominant side: Secondary | ICD-10-CM | POA: Diagnosis present

## 2018-04-04 DIAGNOSIS — Z8673 Personal history of transient ischemic attack (TIA), and cerebral infarction without residual deficits: Secondary | ICD-10-CM | POA: Diagnosis not present

## 2018-04-04 DIAGNOSIS — I619 Nontraumatic intracerebral hemorrhage, unspecified: Secondary | ICD-10-CM | POA: Diagnosis not present

## 2018-04-04 DIAGNOSIS — Z833 Family history of diabetes mellitus: Secondary | ICD-10-CM | POA: Diagnosis not present

## 2018-04-04 DIAGNOSIS — I2510 Atherosclerotic heart disease of native coronary artery without angina pectoris: Secondary | ICD-10-CM | POA: Diagnosis present

## 2018-04-04 DIAGNOSIS — Z79899 Other long term (current) drug therapy: Secondary | ICD-10-CM | POA: Diagnosis not present

## 2018-04-04 DIAGNOSIS — D696 Thrombocytopenia, unspecified: Secondary | ICD-10-CM | POA: Diagnosis present

## 2018-04-04 DIAGNOSIS — K219 Gastro-esophageal reflux disease without esophagitis: Secondary | ICD-10-CM | POA: Diagnosis present

## 2018-04-04 DIAGNOSIS — E782 Mixed hyperlipidemia: Secondary | ICD-10-CM | POA: Diagnosis present

## 2018-04-04 DIAGNOSIS — Z87891 Personal history of nicotine dependence: Secondary | ICD-10-CM

## 2018-04-04 DIAGNOSIS — I6930 Unspecified sequelae of cerebral infarction: Secondary | ICD-10-CM

## 2018-04-04 DIAGNOSIS — I5032 Chronic diastolic (congestive) heart failure: Secondary | ICD-10-CM | POA: Diagnosis present

## 2018-04-04 DIAGNOSIS — C9110 Chronic lymphocytic leukemia of B-cell type not having achieved remission: Secondary | ICD-10-CM | POA: Diagnosis present

## 2018-04-04 DIAGNOSIS — I69122 Dysarthria following nontraumatic intracerebral hemorrhage: Secondary | ICD-10-CM | POA: Diagnosis not present

## 2018-04-04 DIAGNOSIS — Z9861 Coronary angioplasty status: Secondary | ICD-10-CM

## 2018-04-04 DIAGNOSIS — I69359 Hemiplegia and hemiparesis following cerebral infarction affecting unspecified side: Secondary | ICD-10-CM | POA: Diagnosis present

## 2018-04-04 DIAGNOSIS — I130 Hypertensive heart and chronic kidney disease with heart failure and stage 1 through stage 4 chronic kidney disease, or unspecified chronic kidney disease: Secondary | ICD-10-CM | POA: Diagnosis present

## 2018-04-04 DIAGNOSIS — I10 Essential (primary) hypertension: Secondary | ICD-10-CM | POA: Diagnosis present

## 2018-04-04 DIAGNOSIS — Z811 Family history of alcohol abuse and dependence: Secondary | ICD-10-CM | POA: Diagnosis not present

## 2018-04-04 DIAGNOSIS — I69191 Dysphagia following nontraumatic intracerebral hemorrhage: Secondary | ICD-10-CM | POA: Diagnosis not present

## 2018-04-04 DIAGNOSIS — I629 Nontraumatic intracranial hemorrhage, unspecified: Secondary | ICD-10-CM | POA: Diagnosis not present

## 2018-04-04 DIAGNOSIS — E785 Hyperlipidemia, unspecified: Secondary | ICD-10-CM | POA: Diagnosis present

## 2018-04-04 DIAGNOSIS — E46 Unspecified protein-calorie malnutrition: Secondary | ICD-10-CM | POA: Diagnosis present

## 2018-04-04 DIAGNOSIS — Z8249 Family history of ischemic heart disease and other diseases of the circulatory system: Secondary | ICD-10-CM

## 2018-04-04 DIAGNOSIS — R29810 Facial weakness: Secondary | ICD-10-CM | POA: Diagnosis present

## 2018-04-04 DIAGNOSIS — Z8051 Family history of malignant neoplasm of kidney: Secondary | ICD-10-CM | POA: Diagnosis not present

## 2018-04-04 DIAGNOSIS — Z856 Personal history of leukemia: Secondary | ICD-10-CM | POA: Diagnosis not present

## 2018-04-04 DIAGNOSIS — I618 Other nontraumatic intracerebral hemorrhage: Secondary | ICD-10-CM | POA: Diagnosis not present

## 2018-04-04 DIAGNOSIS — Z9841 Cataract extraction status, right eye: Secondary | ICD-10-CM | POA: Diagnosis not present

## 2018-04-04 DIAGNOSIS — Z818 Family history of other mental and behavioral disorders: Secondary | ICD-10-CM | POA: Diagnosis not present

## 2018-04-04 DIAGNOSIS — Z951 Presence of aortocoronary bypass graft: Secondary | ICD-10-CM | POA: Diagnosis not present

## 2018-04-04 DIAGNOSIS — Z9842 Cataract extraction status, left eye: Secondary | ICD-10-CM | POA: Diagnosis not present

## 2018-04-04 LAB — I-STAT CHEM 8, ED
BUN: 20 mg/dL (ref 8–23)
Calcium, Ion: 1.11 mmol/L — ABNORMAL LOW (ref 1.15–1.40)
Chloride: 104 mmol/L (ref 98–111)
Creatinine, Ser: 1.4 mg/dL — ABNORMAL HIGH (ref 0.61–1.24)
Glucose, Bld: 95 mg/dL (ref 70–99)
HCT: 44 % (ref 39.0–52.0)
Hemoglobin: 15 g/dL (ref 13.0–17.0)
Potassium: 3.9 mmol/L (ref 3.5–5.1)
Sodium: 135 mmol/L (ref 135–145)
TCO2: 22 mmol/L (ref 22–32)

## 2018-04-04 LAB — COMPREHENSIVE METABOLIC PANEL
ALT: 20 U/L (ref 0–44)
AST: 22 U/L (ref 15–41)
Albumin: 4 g/dL (ref 3.5–5.0)
Alkaline Phosphatase: 102 U/L (ref 38–126)
Anion gap: 8 (ref 5–15)
BUN: 17 mg/dL (ref 8–23)
CO2: 23 mmol/L (ref 22–32)
Calcium: 9.7 mg/dL (ref 8.9–10.3)
Chloride: 103 mmol/L (ref 98–111)
Creatinine, Ser: 1.52 mg/dL — ABNORMAL HIGH (ref 0.61–1.24)
Glucose, Bld: 99 mg/dL (ref 70–99)
Potassium: 3.9 mmol/L (ref 3.5–5.1)
Sodium: 134 mmol/L — ABNORMAL LOW (ref 135–145)
Total Bilirubin: 0.4 mg/dL (ref 0.3–1.2)
Total Protein: 7 g/dL (ref 6.5–8.1)

## 2018-04-04 LAB — CBC
HCT: 42.4 % (ref 39.0–52.0)
Hemoglobin: 14.2 g/dL (ref 13.0–17.0)
MCH: 32.3 pg (ref 26.0–34.0)
MCHC: 33.5 g/dL (ref 30.0–36.0)
MCV: 96.6 fL (ref 80.0–100.0)
Platelets: 131 10*3/uL — ABNORMAL LOW (ref 150–400)
RBC: 4.39 MIL/uL (ref 4.22–5.81)
RDW: 12.7 % (ref 11.5–15.5)
WBC: 7.7 10*3/uL (ref 4.0–10.5)
nRBC: 0 % (ref 0.0–0.2)

## 2018-04-04 LAB — DIFFERENTIAL
Abs Immature Granulocytes: 0.03 10*3/uL (ref 0.00–0.07)
Basophils Absolute: 0 10*3/uL (ref 0.0–0.1)
Basophils Relative: 0 %
Eosinophils Absolute: 0.2 10*3/uL (ref 0.0–0.5)
Eosinophils Relative: 3 %
Immature Granulocytes: 0 %
Lymphocytes Relative: 20 %
Lymphs Abs: 1.5 10*3/uL (ref 0.7–4.0)
Monocytes Absolute: 0.7 10*3/uL (ref 0.1–1.0)
Monocytes Relative: 10 %
Neutro Abs: 5.2 10*3/uL (ref 1.7–7.7)
Neutrophils Relative %: 67 %

## 2018-04-04 LAB — APTT: aPTT: 33 seconds (ref 24–36)

## 2018-04-04 LAB — I-STAT TROPONIN, ED: Troponin i, poc: 0.02 ng/mL (ref 0.00–0.08)

## 2018-04-04 LAB — CBG MONITORING, ED: Glucose-Capillary: 95 mg/dL (ref 70–99)

## 2018-04-04 LAB — MRSA PCR SCREENING: MRSA by PCR: NEGATIVE

## 2018-04-04 LAB — PROTIME-INR
INR: 1.01
Prothrombin Time: 13.2 seconds (ref 11.4–15.2)

## 2018-04-04 MED ORDER — LABETALOL HCL 5 MG/ML IV SOLN
10.0000 mg | Freq: Once | INTRAVENOUS | Status: AC
  Administered 2018-04-04: 10 mg via INTRAVENOUS

## 2018-04-04 MED ORDER — LORAZEPAM 2 MG/ML IJ SOLN
INTRAMUSCULAR | Status: AC
  Administered 2018-04-04: 2 mg via INTRAVENOUS
  Filled 2018-04-04: qty 1

## 2018-04-04 MED ORDER — CLEVIDIPINE BUTYRATE 0.5 MG/ML IV EMUL
0.0000 mg/h | INTRAVENOUS | Status: DC
  Administered 2018-04-04: 2 mg/h via INTRAVENOUS
  Administered 2018-04-05: 1 mg/h via INTRAVENOUS
  Administered 2018-04-05: 20 mg/h via INTRAVENOUS
  Administered 2018-04-05: 10 mg/h via INTRAVENOUS
  Administered 2018-04-05: 4 mg/h via INTRAVENOUS
  Administered 2018-04-05: 7 mg/h via INTRAVENOUS
  Administered 2018-04-06: 3 mg/h via INTRAVENOUS
  Filled 2018-04-04 (×4): qty 50
  Filled 2018-04-04: qty 100
  Filled 2018-04-04 (×2): qty 50

## 2018-04-04 MED ORDER — PANTOPRAZOLE SODIUM 40 MG IV SOLR
40.0000 mg | Freq: Every day | INTRAVENOUS | Status: DC
  Administered 2018-04-04 – 2018-04-06 (×3): 40 mg via INTRAVENOUS
  Filled 2018-04-04 (×3): qty 40

## 2018-04-04 MED ORDER — LABETALOL HCL 5 MG/ML IV SOLN
10.0000 mg | INTRAVENOUS | Status: DC | PRN
  Administered 2018-04-04 – 2018-04-05 (×2): 10 mg via INTRAVENOUS
  Filled 2018-04-04 (×2): qty 4

## 2018-04-04 MED ORDER — VITAMIN B-12 1000 MCG PO TABS
1000.0000 ug | ORAL_TABLET | Freq: Every day | ORAL | Status: DC
  Administered 2018-04-06 – 2018-04-07 (×2): 1000 ug via ORAL
  Filled 2018-04-04 (×4): qty 1

## 2018-04-04 MED ORDER — STROKE: EARLY STAGES OF RECOVERY BOOK: Freq: Once | Status: DC

## 2018-04-04 MED ORDER — VALACYCLOVIR HCL 500 MG PO TABS
500.0000 mg | ORAL_TABLET | Freq: Every day | ORAL | Status: DC
  Administered 2018-04-06 – 2018-04-07 (×2): 500 mg via ORAL
  Filled 2018-04-04 (×3): qty 1

## 2018-04-04 MED ORDER — ORAL CARE MOUTH RINSE
15.0000 mL | Freq: Two times a day (BID) | OROMUCOSAL | Status: DC
  Administered 2018-04-04 – 2018-04-05 (×4): 15 mL via OROMUCOSAL

## 2018-04-04 MED ORDER — SENNOSIDES-DOCUSATE SODIUM 8.6-50 MG PO TABS
1.0000 | ORAL_TABLET | Freq: Two times a day (BID) | ORAL | Status: DC
  Administered 2018-04-06 – 2018-04-07 (×2): 1 via ORAL
  Filled 2018-04-04 (×3): qty 1

## 2018-04-04 MED ORDER — LABETALOL HCL 5 MG/ML IV SOLN
INTRAVENOUS | Status: AC
  Filled 2018-04-04: qty 4

## 2018-04-04 MED ORDER — LABETALOL HCL 5 MG/ML IV SOLN
20.0000 mg | Freq: Once | INTRAVENOUS | Status: AC
  Administered 2018-04-04: 20 mg via INTRAVENOUS
  Filled 2018-04-04: qty 4

## 2018-04-04 MED ORDER — SIMVASTATIN 20 MG PO TABS: 10.0000 mg | ORAL_TABLET | Freq: Every day | ORAL | Status: DC

## 2018-04-04 MED ORDER — ACETAMINOPHEN 650 MG RE SUPP: 650.0000 mg | RECTAL | Status: DC | PRN

## 2018-04-04 MED ORDER — LISINOPRIL 2.5 MG PO TABS
2.5000 mg | ORAL_TABLET | Freq: Every day | ORAL | Status: DC
  Administered 2018-04-06 – 2018-04-07 (×2): 2.5 mg via ORAL
  Filled 2018-04-04 (×3): qty 1

## 2018-04-04 MED ORDER — LORAZEPAM 2 MG/ML IJ SOLN
2.0000 mg | Freq: Once | INTRAMUSCULAR | Status: AC
  Administered 2018-04-04: 2 mg via INTRAVENOUS

## 2018-04-04 MED ORDER — ACETAMINOPHEN 160 MG/5ML PO SOLN: 650.0000 mg | ORAL | Status: DC | PRN

## 2018-04-04 MED ORDER — ACETAMINOPHEN 325 MG PO TABS: 650.0000 mg | ORAL_TABLET | ORAL | Status: DC | PRN

## 2018-04-04 MED ORDER — ALLOPURINOL 100 MG PO TABS
300.0000 mg | ORAL_TABLET | Freq: Every day | ORAL | Status: DC
  Administered 2018-04-06 – 2018-04-07 (×2): 300 mg via ORAL
  Filled 2018-04-04: qty 1
  Filled 2018-04-04: qty 3
  Filled 2018-04-04: qty 1

## 2018-04-04 NOTE — ED Provider Notes (Signed)
[facility] EMERGENCY DEPARTMENT Provider Note   CSN: [id number] Arrival date & time: 04/04/18  0104     History   Chief Complaint Chief Complaint  Patient presents with  . Code Stroke    HPI Jimmy [name] is a 85 y.o. male.  HPI   85 yo M with extensive PMHx as below here with AMS. Pt presents as code stroke. Per report, LSN was around 10, pt woke up with sudden onset left sidded weakness, with facial droop. On arrival, pt with LUE and LLE weakness. Taken immediately to CT which shows basal ganglia hemorrhage. Pt given ativan for myoclonic jerking vs seizure activity on arrival, with subsequent sedation. History limited 2/2 stroke, confusion, and sedation.  Level 5 caveat invoked as remainder of history, ROS, and physical exam limited due to patient's sedation.   Past Medical History:  Diagnosis Date  . CAD (coronary artery disease)    a. 04/2001 S/P CABGx 4;  b. 2008 MV:  EF 63% normal perfusion.  . Carotid stenosis    a. 11/2012 Carotid U/S:  RICA 33-29%, LICA 51-88%.  . Chronic kidney disease, stage 3 ([place])   . CLL (chronic lymphoblastic leukemia) 2009   Stage IV; Dr. Ivor Messier - referred to Dr. Lissa Merlin [facility] 07/2013 now on Rituxan (10/2013) --> stage 0 (09/2014)  . Diastolic CHF ([place])    a. 07/1658 Echo: EF 55-65%, mild conc LVH, Gr 1 DD, mild AS, triv AI, mildly dil Ao root (3.5 cm).  . GERD (gastroesophageal reflux disease) 1990s  . History of CVA (cerebrovascular accident) 2015   by MRI - remote L internal capsule  . History of herpes genitalis    valtrex daily  . Hyperlipemia 2002  . Hypertension 2002  . Mass of submandibular region 2015   referred to gen surg after chemo  . Mild aortic stenosis    a. 07/2011 Echo: Mild AS, triv AI.  [name] Stroke ([place])   . Thrombocytopenia ([place])    outpatient goals Hgb >9, plt >20  . TIA (transient ischemic attack)    04/2016    Patient Active Problem List   Diagnosis Date Noted  . Intracerebral hemorrhage  04/04/2018  . ICH (intracerebral hemorrhage) ([place]) 04/04/2018  . MCI (mild cognitive impairment) with memory loss 06/17/2017  . Dizziness 06/17/2017  . Diarrhea 10/18/2016  . TIA (transient ischemic attack) 04/08/2016  . Thrombocytopenia ([place]) 04/08/2016  . Leg weakness 11/11/2014  . Advanced care planning/counseling discussion 09/16/2014  . Nocturia 09/16/2014  . Mass of submandibular region   . History of CVA (cerebrovascular accident)   . Chest pressure 11/19/2013  . S/P CABG (coronary artery bypass graft) 11/19/2013  . Chronic kidney disease, stage 3 ([place])   . Aortic stenosis   . Medicare annual wellness visit, subsequent 08/27/2012  . Vitamin D deficiency 08/17/2012  . Dyspnea 06/26/2011  . Erectile dysfunction 09/18/2010  . Carotid artery stenosis 12/01/2009  . UNSPECIFIED SUBJECTIVE VISUAL DISTURBANCE 05/01/2009  . BACK PAIN, LUMBAR 10/24/2008  . Chronic lymphocytic leukemia, Rai stage 0 ([place]) 04/02/2007    Class: Chronic  . DERMATITIS, SEBORRHEIC NOS 12/16/2006  . GENITAL HERPES 12/04/2006  . Mixed hyperlipidemia 12/04/2006  . Essential hypertension 12/04/2006  . Coronary atherosclerosis 12/04/2006  . GERD 12/04/2006  . Prediabetes 12/04/2006  . RENAL CALCULUS, HX OF 12/04/2006    Past Surgical History:  Procedure Laterality Date  . ANGIOPLASTY  1998  . CATARACT EXTRACTION, BILATERAL     R 1/09, L 8/09  .  COLONOSCOPY  11/29/1987   Normal  . COLONOSCOPY  02/07/2003   Hemm. Internal, focal proctitis, negative biopsy  . COLONOSCOPY  12/12/2004   Internal external hemorrhoids, +proctits, negative biopsy  . CORONARY ANGIOPLASTY  1998  . CORONARY ARTERY BYPASS GRAFT  04/23/2001   x4, Dr. Pia Mau  . ESOPHAGOGASTRODUODENOSCOPY  11/29/1987   Gastritis  . ETT  12/10/2006   Persantine myoview nml  . HEMORROIDECTOMY  1954   Fissure repair, [place]  . HEPATIC ARTERY ANGIOPLASTY  1954   [place], [place]  . KIDNEY STONE SURGERY  1977   Dr Redmond Baseman  . LITHOTRIPSY  1990s     Multiple  . US ECHOCARDIOGRAPHY  07/2011   nl sys fxn, EF 55-60%, grade 1 diast dysfunction, mild AS, mildly elevated PA pressure        Home Medications    Prior to Admission medications   Medication Sig Start Date End Date Taking? Authorizing Provider  allopurinol (ZYLOPRIM) 300 MG tablet TAKE 1 TABLET DAILY Patient taking differently: Take 300 mg by mouth daily.  10/31/17  Yes Ria Bush, MD  cholecalciferol (VITAMIN D) 1000 units tablet Take 2,000 Units by mouth daily.   Yes [provider]  diphenhydrAMINE (BENADRYL ALLERGY) 25 mg capsule Take 25 mg by mouth daily as needed for itching, allergies or sleep.    Yes [provider]  dipyridamole-aspirin (AGGRENOX) 200-25 MG 12hr capsule TAKE 1 CAPSULE TWICE A DAY Patient taking differently: Take 1 capsule by mouth 2 (two) times daily.  04/03/18  Yes Ria Bush, MD  lisinopril (PRINIVIL,ZESTRIL) 5 MG tablet TAKE 1 TABLET DAILY (NEW DOSE) Patient taking differently: Take 5 mg by mouth daily.  12/24/17  Yes Ria Bush, MD  nitroGLYCERIN (NITROSTAT) 0.4 MG SL tablet Place 1 tablet (0.4 mg total) under the tongue every 5 (five) minutes as needed for chest pain (max 3 doses in 15 minutes). 05/18/14  Yes Gollan, Kathlene November, MD  simvastatin (ZOCOR) 10 MG tablet TAKE 1 TABLET AT BEDTIME Patient taking differently: Take 10 mg by mouth daily.  01/08/18  Yes Ria Bush, MD  valACYclovir (VALTREX) 500 MG tablet TAKE 1 TABLET DAILY Patient taking differently: Take 500 mg by mouth daily as needed (outbreak).  12/19/17  Yes Ria Bush, MD  vitamin B-12 (CYANOCOBALAMIN) 1000 MCG tablet Take 1 tablet (1,000 mcg total) by mouth daily. 06/17/17  Yes Ria Bush, MD    Family History Family History  Problem Relation Age of Onset  . Hypertension Mother   . Heart disease Mother   . Hyperlipidemia Mother   . Hypertension Father   . Hyperlipidemia Father   . Kidney cancer Sister        Renal cell  cancer  . Alcohol abuse Brother   . Diabetes Brother   . Stroke Brother   . Heart attack Brother        MI  . Diabetes Other        Sister's daughter  . Depression Daughter        Bipolar    Social History Social History   Tobacco Use  . Smoking status: Former Smoker    Packs/day: 2.00    Years: 30.00    Pack years: 60.00    Last attempt to quit: 02/28/1979    Years since quitting: 39.1  . Smokeless tobacco: Never Used  . Tobacco comment: quit 1980  Substance Use Topics  . Alcohol use: No    Alcohol/week: 0.0 standard drinks    Comment: occasional  wine  . Drug use: No     Allergies   New skin and Tape   Review of Systems Review of Systems  Unable to perform ROS: Mental status change     Physical Exam Updated Vital Signs BP (!) 109/55 (BP Location: Left Arm)   Pulse 71   Temp (!) 97 F (36.1 C) (Axillary)   Resp 16   Ht 5\' 10"  (1.778 m)   Wt 75.8 kg   SpO2 96%   BMI 23.98 kg/m   Physical Exam Vitals signs and nursing note reviewed.  Constitutional:      General: He is not in acute distress.    Appearance: He is well-developed.     Comments: Elderly  HENT:     Head: Normocephalic and atraumatic.  Eyes:     Conjunctiva/sclera: Conjunctivae normal.  Neck:     Musculoskeletal: Neck supple.  Cardiovascular:     Rate and Rhythm: Normal rate and regular rhythm.     Heart sounds: Normal heart sounds. No murmur. No friction rub.  Pulmonary:     Effort: Pulmonary effort is normal. No respiratory distress.     Breath sounds: Normal breath sounds. No wheezing or rales.  Abdominal:     General: There is no distension.     Palpations: Abdomen is soft.     Tenderness: There is no abdominal tenderness.  Skin:    General: Skin is warm.     Capillary Refill: Capillary refill takes less than 2 seconds.  Neurological:     Mental Status: He is alert.     Motor: No abnormal muscle tone.     Comments: Drowsy s/p ativan. Oriented to person only. Weak LUE and  LLE. Localized and 5/5 on RUE and RLE. Speech slurred. Tongue protrusion midline.      ED Treatments / Results  Labs (all labs ordered are listed, but only abnormal results are displayed) Labs Reviewed  CBC - Abnormal; Notable for the following components:      Result Value   Platelets 131 (*)    All other components within normal limits  COMPREHENSIVE METABOLIC PANEL - Abnormal; Notable for the following components:   Sodium 134 (*)    Creatinine, Ser 1.52 (*)    All other components within normal limits  I-STAT CHEM 8, ED - Abnormal; Notable for the following components:   Creatinine, Ser 1.40 (*)    Calcium, Ion 1.11 (*)    All other components within normal limits  MRSA PCR SCREENING  PROTIME-INR  APTT  DIFFERENTIAL  CBG MONITORING, ED  I-STAT TROPONIN, ED  CBG MONITORING, ED    EKG None   Normal sinus rhythm,   Radiology Chest Port 1 View  Result Date: 04/04/2018 CLINICAL DATA:  Intracranial hemorrhage. EXAM: PORTABLE CHEST 1 VIEW COMPARISON:  None. FINDINGS: Patient is rotated. Low lung volumes. Post median sternotomy and CABG. Heart size upper normal, likely accentuated by technique. Streaky right infrahilar and left midlung atelectasis. Opacity at the left costophrenic angle likely accentuated by overlapping soft tissue structures. No pulmonary edema. No evidence of pleural effusion. No pneumothorax. IMPRESSION: 1. Low lung volumes with right infrahilar and left midlung atelectasis. 2. Opacity at the left costophrenic angle likely soft tissue summation accentuated by rotation. Electronically Signed   By: Keith Rake M.D.   On: 04/04/2018 02:43   Ct Head Code Stroke Wo Contrast  Result Date: 04/04/2018 CLINICAL DATA:  Code stroke. Initial evaluation for acute left-sided facial droop and weakness. EXAM:  CT HEAD WITHOUT CONTRAST TECHNIQUE: Contiguous axial images were obtained from the base of the skull through the vertex without intravenous contrast. COMPARISON:   Prior MRI from 04/09/2016. FINDINGS: Brain: Acute intraparenchymal hemorrhage centered at the posterior right lentiform nucleus/external capsule measures 2.7 x 2.5 x 2.6 cm (estimated volume 8.8 cc). Mild localized vasogenic edema without significant regional mass effect. No intraventricular extension or other complication. No other acute intracranial hemorrhage. No other acute large vessel territory infarct. Underlying moderate to advanced chronic microvascular ischemic disease noted. Multiple remote lacunar infarcts seen involving the left basal ganglia and left thalamus. No mass lesion or midline shift. No hydrocephalus. No extra-axial fluid collection. Vascular: No asymmetric hyperdense vessel. Calcified atherosclerosis at the skull base. Skull: Scalp soft tissues and calvarium within normal limits. Sinuses/Orbits: Globes and orbital soft tissues demonstrate no acute finding. Mild mucosal thickening and opacity within the ethmoidal air cells and left maxillary sinus. Mastoid air cells are clear. Other: None. IMPRESSION: 1. 8.8 cc acute intraparenchymal hematoma centered at the posterior right lentiform nucleus without significant regional mass effect. Underlying hypertensive etiology suspected. 2. Underlying advanced chronic microvascular ischemic disease with multiple remote lacunar infarcts involving the left basal ganglia and thalamus. These results were communicated to [place] at 1:16 amon 1/4/2020by text page via the [facility] messaging system. Electronically Signed   By: Jeannine Boga M.D.   On: 04/04/2018 01:18    Procedures .Critical Care Performed by: Duffy Bruce, MD Authorized by: Duffy Bruce, MD   Critical care provider statement:    Critical care time (minutes):  35   Critical care time was exclusive of:  Separately billable procedures and treating other patients and teaching time   Critical care was necessary to treat or prevent imminent or life-threatening deterioration of the  following conditions:  CNS failure or compromise   Critical care was time spent personally by me on the following activities:  Development of treatment plan with patient or surrogate, discussions with consultants, evaluation of patient's response to treatment, examination of patient, obtaining history from patient or surrogate, ordering and performing treatments and interventions, ordering and review of laboratory studies, ordering and review of radiographic studies, pulse oximetry, re-evaluation of patient's condition and review of old charts   I assumed direction of critical care for this patient from another provider in my specialty: no     (including critical care time)  Medications Ordered in ED Medications   stroke: mapping our early stages of recovery book (has no administration in time range)  acetaminophen (TYLENOL) tablet 650 mg (has no administration in time range)    Or  acetaminophen (TYLENOL) solution 650 mg (has no administration in time range)    Or  acetaminophen (TYLENOL) suppository 650 mg (has no administration in time range)  senna-docusate (Senokot-S) tablet 1 tablet (has no administration in time range)  pantoprazole (PROTONIX) injection 40 mg (has no administration in time range)  labetalol (NORMODYNE,TRANDATE) injection 20 mg (20 mg Intravenous Given 04/04/18 0326)    And  clevidipine (CLEVIPREX) infusion 0.5 mg/mL (has no administration in time range)  allopurinol (ZYLOPRIM) tablet 300 mg (has no administration in time range)  lisinopril (PRINIVIL,ZESTRIL) tablet 2.5 mg (has no administration in time range)  simvastatin (ZOCOR) tablet 10 mg (has no administration in time range)  valACYclovir (VALTREX) tablet 500 mg (has no administration in time range)  vitamin B-12 (CYANOCOBALAMIN) tablet 1,000 mcg (has no administration in time range)  labetalol (NORMODYNE,TRANDATE) injection 10 mg (10 mg Intravenous Given 04/04/18 0114)  LORazepam (ATIVAN) injection 2 mg (2 mg  Intravenous Given 04/04/18 0119)     Initial Impression / Assessment and Plan / ED Course  I have reviewed the triage vital signs and the nursing notes.  Pertinent labs & imaging results that were available during my care of the patient were reviewed by me and considered in my medical decision making (see chart for details).  Clinical Course as of Apr 05 615  Sat Apr 04, 2018  0139 85 yo M here with hemorrhagic stroke. BP 150s, improved w/ IV labetalol. Pt with myoclonic jerking, no response to Ativan given by Neuro - likely 2/2 irritation from blood rather than seizure like activity. Will be admitted to neuro ICU.   [CI]    Clinical Course User Index [CI] Duffy Bruce, MD    Discussed w/ wife, friend. Will admit to ICU. FULL CODE. BP at goal. He is protecting airway and increasingly awake.  Final Clinical Impressions(s) / ED Diagnoses   Final diagnoses:  Hemorrhagic stroke [facility])    ED Discharge Orders    None       Duffy Bruce, MD 04/04/18 [phone]

## 2018-04-04 NOTE — Consult Note (Addendum)
Referring Physician: Dr. Ellender Hose    Chief Complaint: Left sided weakness  HPI: Jimmy [name] is an 85 y.o. male presenting from home via EMS with sudden onset of left sided weakness. He has a history of stroke about 2 years ago with trouble talking and confusion. He takes Aggrenox at home. LKN 2200 while laying in bed per wife. At about midnight, wife noted that he was weak on the left with a facial droop. EMS arrived at 12:15 and noted the same findings. Not on a blood thinner. BP en route was 152/75, HR NSR in 70's, sats 95% on RA and CBG 107. On arrival to the ED the patient continued to be weak on his left side.   CT head in the ED revealed an acute right basal ganglia hemorrhage, most likely a hypertensive bleed.   Past Medical History:  Diagnosis Date  . CAD (coronary artery disease)    a. 04/2001 S/P CABGx 4;  b. 2008 MV:  EF 63% normal perfusion.  . Carotid stenosis    a. 11/2012 Carotid U/S:  RICA 38-25%, LICA 05-39%.  . Chronic kidney disease, stage 3 ([place])   . CLL (chronic lymphoblastic leukemia) 2009   Stage IV; Dr. Ivor Messier - referred to Dr. Lissa Merlin [facility] 07/2013 now on Rituxan (10/2013) --> stage 0 (09/2014)  . Diastolic CHF ([place])    a. 09/6732 Echo: EF 55-65%, mild conc LVH, Gr 1 DD, mild AS, triv AI, mildly dil Ao root (3.5 cm).  . GERD (gastroesophageal reflux disease) 1990s  . History of CVA (cerebrovascular accident) 2015   by MRI - remote L internal capsule  . History of herpes genitalis    valtrex daily  . Hyperlipemia 2002  . Hypertension 2002  . Mass of submandibular region 2015   referred to gen surg after chemo  . Mild aortic stenosis    a. 07/2011 Echo: Mild AS, triv AI.  [name] Stroke ([place])   . Thrombocytopenia ([place])    outpatient goals Hgb >9, plt >20  . TIA (transient ischemic attack)    04/2016    Past Surgical History:  Procedure Laterality Date  . ANGIOPLASTY  1998  . CATARACT EXTRACTION, BILATERAL     R 1/09, L 8/09  . COLONOSCOPY  11/29/1987   Normal   . COLONOSCOPY  02/07/2003   Hemm. Internal, focal proctitis, negative biopsy  . COLONOSCOPY  12/12/2004   Internal external hemorrhoids, +proctits, negative biopsy  . CORONARY ANGIOPLASTY  1998  . CORONARY ARTERY BYPASS GRAFT  04/23/2001   x4, Dr. Pia Mau  . ESOPHAGOGASTRODUODENOSCOPY  11/29/1987   Gastritis  . ETT  12/10/2006   Persantine myoview nml  . HEMORROIDECTOMY  1954   Fissure repair, [place]  . HEPATIC ARTERY ANGIOPLASTY  1954   [place], [place]  . KIDNEY STONE SURGERY  1977   Dr Redmond Baseman  . LITHOTRIPSY  1990s   Multiple  . US ECHOCARDIOGRAPHY  07/2011   nl sys fxn, EF 55-60%, grade 1 diast dysfunction, mild AS, mildly elevated PA pressure    Family History  Problem Relation Age of Onset  . Hypertension Mother   . Heart disease Mother   . Hyperlipidemia Mother   . Hypertension Father   . Hyperlipidemia Father   . Kidney cancer Sister        Renal cell cancer  . Alcohol abuse Brother   . Diabetes Brother   . Stroke Brother   . Heart attack Brother  MI  . Diabetes Other        Sister's daughter  . Depression Daughter        Bipolar   Social History:  reports that he quit smoking about 39 years ago. He has a 60.00 pack-year smoking history. He has never used smokeless tobacco. He reports that he does not drink alcohol or use drugs.  Allergies:  Allergies  Allergen Reactions  . New Skin   . Tape Dermatitis    Home Medications:  Aggrenox Allopurinol Benadryl Cholecalceferol Lisinopril Nitroglycerin Simvastatin Valacyclovir  ROS: As per HPI. Detailed ROS deferred due to acuity of presentation.   Physical Examination: There were no vitals taken for this visit.  HEENT: [place]/AT Lungs: Respirations unlabored Ext: Warm and well perfused. No edema  Neurologic Examination: Mental Status: Alert, fully oriented, thought content appropriate.  Speech fluent with intact comprehension and naming. Able to follow all commands without difficulty. Cranial  Nerves: II:  Visual fields intact bilaterally. PERRL.  III,IV, VI: Mild lid closure weakness on the left. EOMI with saccadic pursuits noted. No nystagmus.  V,VII: Left facial droop. Decreased temp sensation on the left.  VIII: Hearing intact to voice IX,X: Mild hypophonia XI: Decreased trapezius tone on the left XII: midline tongue extension  Motor: RUE and RLE: 5/5 LUE: 2/5 biceps and triceps, otherwise 0/5 LLE: 2/5 hip flexion, 4-/5 knee extension, 0/5 APF/ADF, able to weakly wiggle toes Sensory: Absent temp and FT sensation to LUE and LLE, but able to sense pressure. Extinction on the left to DSS.   Deep Tendon Reflexes:  Right biceps and brachioradialis 2+ Left biceps and brachioradialis 2+ Right patella 2+ Left patella 2+ Right achilles 0, left achilles 1+ Right toe downgoing, left toe upgoing Cerebellar: No ataxia with FNF on the right, unable to perform on the left  Gait: Unable to assess Other: Intermittent left biceps and distal LLE twitching  Results for orders placed or performed during the hospital encounter of 04/04/18 (from the past 48 hour(s))  CBG monitoring, ED     Status: None   Collection Time: 04/04/18 12:48 AM  Result Value Ref Range   Glucose-Capillary 95 70 - 99 mg/dL   CT head (images reviewed): 1. 8.8 cc acute intraparenchymal hematoma centered at the posterior right lentiform nucleus without significant regional mass effect. Underlying hypertensive etiology suspected. 2. Underlying advanced chronic microvascular ischemic disease with multiple remote lacunar infarcts involving the left basal ganglia and thalamus.   Assessment: 85 y.o. male presenting with right basal ganglia hypertensive hemorrhage 1. Exam shows left facial droop, plegic LUE and significant weakness of LLE.  2. ICH score: 1 3. 2 mg Ativan IV x 1 administered for twitching prior to CT. Now with intermittent low amplitude dyskinetic movements of LLE. Most likely secondary to irritation  from right basal ganglia hematoma.   Plan: 1. Admit to ICU under the Neurology service.  2. MRI brain 3  PT consult, OT consult, Speech consult 4. Cardiac telemetry 5. Frequent neuro checks 6. Stopping aggrenox.   7. BP management with PRN labetalol and clevidipine drip. Goal SBP < 140 8. No antiplatelet medications or anticoagulants. DVT prophylaxis with SCDs  60 minutes spent in the emergent neurological evaluation and management of this critically ill patient  @Electronically  signed: Dr. Kerney Elbe  04/04/2018, 12:56 AM

## 2018-04-04 NOTE — Evaluation (Signed)
Clinical/Bedside Swallow Evaluation Patient Details  Name: Jimmy [name] MRN: [id number] Date of Birth: 1929/01/01  Today's Date: 04/04/2018 Time: SLP Start Time (ACUTE ONLY): 0941 SLP Stop Time (ACUTE ONLY): 0959 SLP Time Calculation (min) (ACUTE ONLY): 18 min  Past Medical History:  Past Medical History:  Diagnosis Date  . CAD (coronary artery disease)    a. 04/2001 S/P CABGx 4;  b. 2008 MV:  EF 63% normal perfusion.  . Carotid stenosis    a. 11/2012 Carotid U/S:  RICA 76-81%, LICA 15-72%.  . Chronic kidney disease, stage 3 (Jimmy [name])   . CLL (chronic lymphoblastic leukemia) 2009   Stage IV; Dr. Ivor Messier - referred to Dr. Lissa Merlin [facility] 07/2013 now on Rituxan (10/2013) --> stage 0 (09/2014)  . Diastolic CHF ([name])    a. 08/2033 Echo: EF 55-65%, mild conc LVH, Gr 1 DD, mild AS, triv AI, mildly dil Ao root (3.5 cm).  . GERD (gastroesophageal reflux disease) 1990s  . History of CVA (cerebrovascular accident) 2015   by MRI - remote L internal capsule  . History of herpes genitalis    valtrex daily  . Hyperlipemia 2002  . Hypertension 2002  . Mass of submandibular region 2015   referred to gen surg after chemo  . Mild aortic stenosis    a. 07/2011 Echo: Mild AS, triv AI.  [name] Stroke ([name])   . Thrombocytopenia ([name])    outpatient goals Hgb >9, plt >20  . TIA (transient ischemic attack)    04/2016   Past Surgical History:  Past Surgical History:  Procedure Laterality Date  . ANGIOPLASTY  1998  . CATARACT EXTRACTION, BILATERAL     R 1/09, L 8/09  . COLONOSCOPY  11/29/1987   Normal  . COLONOSCOPY  02/07/2003   Hemm. Internal, focal proctitis, negative biopsy  . COLONOSCOPY  12/12/2004   Internal external hemorrhoids, +proctits, negative biopsy  . CORONARY ANGIOPLASTY  1998  . CORONARY ARTERY BYPASS GRAFT  04/23/2001   x4, Dr. Pia Mau  . ESOPHAGOGASTRODUODENOSCOPY  11/29/1987   Gastritis  . ETT  12/10/2006   Persantine myoview nml  . HEMORROIDECTOMY  1954   Fissure repair, [place]  .  HEPATIC ARTERY ANGIOPLASTY  1954   [place], [place]  . KIDNEY STONE SURGERY  1977   Dr Redmond Baseman  . LITHOTRIPSY  1990s   Multiple  . US ECHOCARDIOGRAPHY  07/2011   nl sys fxn, EF 55-60%, grade 1 diast dysfunction, mild AS, mildly elevated PA pressure   HPI:  85 year old male admitted with left sided weakness and facial droop. CT scan showed right basal ganglia hemorrage. Patient given ativan for myoclonic jerking vs seizure activity on arrival with subsequent sedation. Also with h/o CVA and TIA, GERD.    Assessment / Plan / Recommendation Clinical Impression  Patient remains lethargic, arousable, however requiring moderate-max cues to sustain adequate level of alertness for exam. Significant left sided facial weakness noted resulting in dysarthria and oral transit delays. Vocal quality wet at baseline with consistent coughing noted post intake of ice chips indicative of a severe pharyngeal dysphagia with poor secretion management. Lethargy likely exacerbating deficits. Recommend continued NPO. SLP will f/u 1/5 for hopeful improvement and readiness for either pos or instrumental study (likely).  SLP Visit Diagnosis: Dysphagia, oropharyngeal phase (R13.12)    Aspiration Risk  Severe aspiration risk    Diet Recommendation NPO   Medication Administration: Via alternative means    Other  Recommendations Oral Care Recommendations: Oral care QID  Follow up Recommendations Inpatient Rehab      Frequency and Duration min 3x week  2 weeks       Prognosis Prognosis for Safe Diet Advancement: Good      Swallow Study   General HPI: 85 year old male admitted with left sided weakness and facial droop. CT scan showed right basal ganglia hemorrage. Patient given ativan for myoclonic jerking vs seizure activity on arrival with subsequent sedation. Also with h/o CVA and TIA, GERD.  Type of Study: Bedside Swallow Evaluation Previous Swallow Assessment: none Diet Prior to this Study: NPO Temperature  Spikes Noted: No Respiratory Status: Room air History of Recent Intubation: No Behavior/Cognition: Lethargic/Drowsy;Requires cueing Oral Cavity Assessment: Within Functional Limits Oral Care Completed by SLP: Recent completion by staff Oral Cavity - Dentition: Adequate natural dentition Vision: Functional for self-feeding Self-Feeding Abilities: Needs assist Patient Positioning: Upright in bed Baseline Vocal Quality: Wet;Low vocal intensity Volitional Cough: Congested;Weak Volitional Swallow: Able to elicit    Oral/Motor/Sensory Function Overall Oral Motor/Sensory Function: Severe impairment Facial ROM: Reduced left;Suspected CN VII (facial) dysfunction Facial Symmetry: Abnormal symmetry left;Suspected CN VII (facial) dysfunction Facial Strength: Reduced left;Suspected CN VII (facial) dysfunction Facial Sensation: Within Functional Limits Lingual ROM: Reduced left;Suspected CN XII (hypoglossal) dysfunction Lingual Symmetry: Within Functional Limits Lingual Strength: Reduced Lingual Sensation: Within Functional Limits Velum: Within Functional Limits Mandible: Within Functional Limits   Ice Chips Ice chips: Impaired Presentation: Spoon Oral Phase Impairments: Impaired mastication Oral Phase Functional Implications: Prolonged oral transit Pharyngeal Phase Impairments: Wet Vocal Quality;Cough - Immediate   Thin Liquid Thin Liquid: Not tested    Nectar Thick Nectar Thick Liquid: Not tested   Honey Thick Honey Thick Liquid: Not tested   Puree Puree: Not tested   Solid     Solid: Not tested     Serai Tukes MA, CCC-SLP   Jaid Quirion Meryl 04/04/2018,10:07 AM

## 2018-04-04 NOTE — Evaluation (Addendum)
Physical Therapy Evaluation Patient Details Name: Jimmy [name] MRN: [id number] DOB: [birth year] Today's Date: 04/04/2018   History of Present Illness  Pt adm with lt sided weakness and CT showed acute right basal ganglia hemorrhage. PMH - HTN, CLL, CABG, carotid stenosis, CVA, CAD, CHF  Clinical Impression  Pt presents to PT with poor mobility due to cognitive deficits, weakness, and impaired balance. Pt motivated and feel he would be a good candidate for CIR. Wife supportive.    Follow Up Recommendations CIR    Equipment Recommendations  Other (comment)(To be determined)    Recommendations for Other Services Rehab consult     Precautions / Restrictions Precautions Precautions: Fall Restrictions Weight Bearing Restrictions: No      Mobility  Bed Mobility Overal bed mobility: Needs Assistance Bed Mobility: Supine to Sit     Supine to sit: Mod assist     General bed mobility comments: Assist to move LLE off of bed, elevate trunk into sitting, and bring hips to EOB.  Transfers Overall transfer level: Needs assistance Equipment used: Ambulation equipment used Transfers: Sit to/from Stand Sit to Stand: +2 physical assistance;Mod assist         General transfer comment: Assist to bring hips up and to control balance. Pt with significant lt lean. Used Stedy with 2 person assist to go bed to chair. 1 person to move [place] and 1 person to manage lt lean.  Ambulation/Gait             General Gait Details: Unable  Stairs            Wheelchair Mobility    Modified Rankin (Stroke Patients Only) Modified Rankin (Stroke Patients Only) Pre-Morbid Rankin Score: No symptoms Modified Rankin: Severe disability     Balance Overall balance assessment: Needs assistance Sitting-balance support: Bilateral upper extremity supported;Feet supported Sitting balance-Leahy Scale: Poor Sitting balance - Comments: Pt sat EOB x 12 minutes. Pt with min assist to control  left lean in static sitting. Any dynamic movements required mod assist  Postural control: Left lateral lean;Posterior lean Standing balance support: Bilateral upper extremity supported Standing balance-Leahy Scale: Poor Standing balance comment: Stedy and mod assist to control left lean                             Pertinent Vitals/Pain Pain Assessment: No/denies pain    Home Living Family/patient expects to be discharged to:: Private residence Living Arrangements: Spouse/significant other Available Help at Discharge: Family Type of Home: House Home Access: Stairs to enter Entrance Stairs-Rails: Right;Left(in garage only on lt side) Entrance Stairs-Number of Steps: 4 in front and in garage, ramp on back Home Layout: One level Home Equipment: [place] - 2 wheels;Shower seat;Hand held shower head      Prior Function Level of Independence: Independent         Comments: goes to gym every morning     Hand Dominance   Dominant Hand: Right    Extremity/Trunk Assessment   Upper Extremity Assessment Upper Extremity Assessment: Defer to OT evaluation    Lower Extremity Assessment Lower Extremity Assessment: LLE deficits/detail LLE Deficits / Details: grossly 2+/5       Communication   Communication: Expressive difficulties(dysarthria)  Cognition Arousal/Alertness: Awake/alert Behavior During Therapy: Impulsive Overall Cognitive Status: Impaired/Different from baseline Area of Impairment: Memory;Following commands;Safety/judgement;Awareness;Problem solving;Attention                   Current Attention Level:  Selective Memory: Decreased short-term memory Following Commands: Follows one step commands consistently Safety/Judgement: Decreased awareness of safety;Decreased awareness of deficits Awareness: Intellectual Problem Solving: Requires verbal cues;Requires tactile cues General Comments: Lt neglect/inattention      General Comments       Exercises     Assessment/Plan    PT Assessment Patient needs continued PT services  PT Problem List Decreased strength;Decreased balance;Decreased activity tolerance;Decreased mobility;Decreased cognition;Decreased coordination;Decreased knowledge of use of DME;Decreased safety awareness;Decreased knowledge of precautions       PT Treatment Interventions DME instruction;Gait training;Functional mobility training;Therapeutic activities;Therapeutic exercise;Balance training;Neuromuscular re-education;Cognitive remediation;Patient/family education;Wheelchair mobility training    PT Goals (Current goals can be found in the Care Plan section)  Acute Rehab PT Goals Patient Stated Goal: take shower PT Goal Formulation: With patient/family Time For Goal Achievement: 04/18/18 Potential to Achieve Goals: Good    Frequency Min 4X/week   Barriers to discharge        Co-evaluation               AM-PAC PT "6 Clicks" Mobility  Outcome Measure Help needed turning from your back to your side while in a flat bed without using bedrails?: A Lot Help needed moving from lying on your back to sitting on the side of a flat bed without using bedrails?: A Lot Help needed moving to and from a bed to a chair (including a wheelchair)?: Total Help needed standing up from a chair using your arms (e.g., wheelchair or bedside chair)?: Total Help needed to walk in hospital room?: Total Help needed climbing 3-5 steps with a railing? : Total 6 Click Score: 8    End of Session Equipment Utilized During Treatment: Gait belt Activity Tolerance: Patient tolerated treatment well Patient left: in chair;with call bell/phone within reach;with family/visitor present;with nursing/sitter in room Nurse Communication: Mobility status;Need for lift equipment(nurse assisted with transfer) PT Visit Diagnosis: Other abnormalities of gait and mobility (R26.89);Hemiplegia and hemiparesis Hemiplegia - Right/Left:  Left Hemiplegia - dominant/non-dominant: Non-dominant Hemiplegia - caused by: Nontraumatic intracerebral hemorrhage    Time: 1600-1630 PT Time Calculation (min) (ACUTE ONLY): 30 min   Charges:   PT Evaluation $PT Eval Moderate Complexity: 1 Mod PT Treatments $Therapeutic Activity: 8-22 mins        [place] Pager [phone] Office [place] 04/04/2018, 5:35 PM

## 2018-04-04 NOTE — Progress Notes (Signed)
STROKE TEAM PROGRESS NOTE   HISTORY OF PRESENT ILLNESS (per record) Jimmy [name] is an 85 y.o. male presenting from home via EMS with sudden onset of left sided weakness. He has a history of stroke about 2 years ago with trouble talking and confusion. He takes Aggrenox at home. LKN 2200 while laying in bed per wife. At about midnight, wife noted that he was weak on the left with a facial droop. EMS arrived at 12:15 and noted the same findings. Not on a blood thinner. BP en route was 152/75, HR NSR in 70's, sats 95% on RA and CBG 107. On arrival to the ED the patient continued to be weak on his left side.   CT head in the ED revealed an acute right basal ganglia hemorrhage, most likely a hypertensive bleed.    SUBJECTIVE (INTERVAL HISTORY) His RN is at the bedside.  . Patient continues to have mild dysarthria and left hemiparesis and has been coughing.  He has not passed swallow eval.  Blood pressure appears controlled    OBJECTIVE Vitals:   04/04/18 0245 04/04/18 0323 04/04/18 0330 04/04/18 0400  BP: 126/72 (!) 141/78 (!) 126/59 (!) 109/55  Pulse: 77 70 71 71  Resp: 20 16 20 16   Temp:  (!) 97 F (36.1 C)    TempSrc:  Axillary    SpO2: 95% 99% 97% 96%  Weight:  75.8 kg    Height:  5\' 10"  (1.778 m)      CBC:  Recent Labs  Lab 04/04/18 0056 04/04/18 0102  WBC  --  7.7  NEUTROABS  --  5.2  HGB 15.0 14.2  HCT 44.0 42.4  MCV  --  96.6  PLT  --  131*    Basic Metabolic Panel:  Recent Labs  Lab 04/04/18 0056 04/04/18 0102  NA 135 134*  K 3.9 3.9  CL 104 103  CO2  --  23  GLUCOSE 95 99  BUN 20 17  CREATININE 1.40* 1.52*  CALCIUM  --  9.7    Lipid Panel:     Component Value Date/Time   CHOL 107 10/21/2017 1218   TRIG 157.0 (H) 10/21/2017 1218   HDL 36.80 (L) 10/21/2017 1218   CHOLHDL 3 10/21/2017 1218   VLDL 31.4 10/21/2017 1218   LDLCALC 39 10/21/2017 1218   HgbA1c:  Lab Results  Component Value Date   HGBA1C 6.0 10/21/2017   Urine Drug Screen:      Component Value Date/Time   LABOPIA NONE DETECTED 04/08/2016 1500   COCAINSCRNUR NONE DETECTED 04/08/2016 1500   LABBENZ NONE DETECTED 04/08/2016 1500   AMPHETMU NONE DETECTED 04/08/2016 1500   THCU NONE DETECTED 04/08/2016 1500   LABBARB NONE DETECTED 04/08/2016 1500    Alcohol Level     Component Value Date/Time   [facility] <5 04/08/2016 1415    IMAGING  Chest Port 1 View 04/04/2018 IMPRESSION:  1. Low lung volumes with right infrahilar and left midlung atelectasis.  2. Opacity at the left costophrenic angle likely soft tissue summation accentuated by rotation.   Ct Head Code Stroke Wo Contrast 04/04/2018 IMPRESSION:  1. 8.8 cc acute intraparenchymal hematoma centered at the posterior right lentiform nucleus without significant regional mass effect. Underlying hypertensive etiology suspected.  2. Underlying advanced chronic microvascular ischemic disease with multiple remote lacunar infarcts involving the left basal ganglia and thalamus.    Ct Head Wo Contrast 04/04/2018 Pending   MRI Brain WO Contrast 04/04/2018 Pending   Bilateral Carotid Dopplers -  pending    PHYSICAL EXAM Blood pressure (!) 109/55, pulse 71, temperature (!) 97 F (36.1 C), temperature source Axillary, resp. rate 16, height 5\' 10"  (1.778 m), weight 75.8 kg, SpO2 96 %.  Pleasant elderly gentleman not in distress. . Afebrile. Head is nontraumatic. Neck is supple without bruit.    Cardiac exam no murmur or gallop. Lungs are clear to auscultation. Distal pulses are well felt.  Neurological Exam :  Awake alert oriented x3.  Mild dysarthria.  No aphasia.  Extraocular movements are full range without nystagmus.  Blinks to threat bilaterally.  Mild left lower facial weakness.  Tongue midline.  Left hemiplegia with 2/5 left elbow flexion and extension otherwise 0/5.  Trace to flexion in the left foot and knee extension otherwise 0/5.  Tone is diminished on the left compared to the right.  Sensation is intact  bilaterally.  Left plantar equivocal right downgoing.  Gait not tested.  ASSESSMENT/PLAN [name] is a 85 y.o. male with history of previous strokes, Htn, Hld, CLL, CAD, CHF, and CKD presenting with left sided weakness and facial droop. He did not receive IV t-PA due to [place].  Acute right basal ganglia hemorrhage - hypertensive  Resultant dysarthria and left hemiplegia  CT head - 8.8 cc acute intraparenchymal hematoma centered at the posterior right lentiform nucleus - multiple remote infarcts.  MRI head - pending  MRA head  - not ordered  Repeat Head CT - pending  Carotid Doppler - pending  2D Echo - not performed  LDL  - not ordered  HgbA1c  - not ordered  UDS - not ordered  VTE prophylaxis - SCDs  Diet - NPO  dipyridamole SR 250 mg/aspirin 25 mg twice a day prior to admission, now on No antithrombotic  Ongoing aggressive stroke risk factor management  Therapy recommendations:  pending  Disposition:  Pending  Hypertension  Stable . Long-term BP goal normotensive  Hyperlipidemia  Lipid lowering medication PTA:  Zocor 10 mg daily  Current lipid lowering medication: Zocor 10 mg daily (will D/C for now)  Continue statin at discharge   Other Stroke Risk Factors  Advanced age  Former cigarette smoker - quit  Hx stroke/TIA   Family hx stroke (brother)  Coronary artery disease   Other Active Problems  Creatinine - 1.52  Plt - 131   Hospital day # 0 I have personally obtained history,examined this patient, reviewed notes, independently viewed imaging studies, participated in medical decision making and plan of care.ROS completed by me personally and pertinent positives fully documented  I have made any additions or clarifications directly to the above note.  He has presented with left hemiplegia due to small right basal ganglia hemorrhage likely of hypertensive etiology.  Recommend strict control of hypertension with blood pressure goal  below 140 for 24 hours and then 180.  Taper Cardene drip and use as needed labetalol and hydralazine.  Check swallow eval and start oral medications when able to swallow safely.  No family available at the bedside.  Continue therapy consults and check MRI/MRA and echocardiogram This patient is critically ill and at significant risk of neurological worsening, death and care requires constant monitoring of vital signs, hemodynamics,respiratory and cardiac monitoring, extensive review of multiple databases, frequent neurological assessment, discussion with family, other specialists and medical decision making of high complexity.I have made any additions or clarifications directly to the above note.This critical care time does not reflect procedure time, or teaching time or supervisory time of  PA/NP/Med Resident etc but could involve care discussion time.  I spent 35 minutes of neurocritical care time  in the care of  this patient.     Antony Contras, MD Medical Director [facility] Pager: [phone] 04/04/2018 11:24 AM   To contact Stroke Continuity provider, please refer to [email or web address] After hours, contact General Neurology

## 2018-04-05 ENCOUNTER — Inpatient Hospital Stay (HOSPITAL_COMMUNITY): Payer: Medicare Other

## 2018-04-05 DIAGNOSIS — I610 Nontraumatic intracerebral hemorrhage in hemisphere, subcortical: Secondary | ICD-10-CM

## 2018-04-05 DIAGNOSIS — I618 Other nontraumatic intracerebral hemorrhage: Secondary | ICD-10-CM

## 2018-04-05 MED ORDER — SODIUM CHLORIDE 0.9 % IV SOLN
500.0000 mL | INTRAVENOUS | Status: DC
  Administered 2018-04-05: via INTRAVENOUS
  Administered 2018-04-05: 500 mL via INTRAVENOUS
  Administered 2018-04-06: 1000 mL via INTRAVENOUS
  Administered 2018-04-06: 18:00:00 via INTRAVENOUS
  Administered 2018-04-07: 500 mL via INTRAVENOUS

## 2018-04-05 NOTE — Progress Notes (Signed)
  Speech Language Pathology Treatment: Dysphagia  Patient Details Name: Jimmy [name] MRN: [id number] DOB: 25-Apr-1928 Today's Date: 04/05/2018 Time: 8889-1694 SLP Time Calculation (min) (ACUTE ONLY): 22 min  Assessment / Plan / Recommendation Clinical Impression  Pt with significant improvements in arousal, attention today, and eager to reattempt POs. Facial weakness continues to contribute to what appears to be delayed oral transit and suspected posterior bolus loss with liquids, but there is no anterior spillage. Pt with immediate cough with initial ice chip, however subsequent trials were tolerated well, without overt signs of aspiration. Progressed to teaspoons of liquids, which elicited immediate, explosive coughing, increased respiratory rate, suggestive of aspiration. Thicker liquids provoked the same response. Recommend MBS for objective evaluation of swallowing; discussed with pt and his wife who are in agreement with this plan. Fluoro unable to schedule MBS today so will plan for next date. Continue NPO, though pt may have single ice chips after oral care as long as he is not exhibiting coughing or signs of respiratory distress.    HPI HPI: 85 year old male admitted with left sided weakness and facial droop. CT scan showed right basal ganglia hemorrage. Patient given ativan for myoclonic jerking vs seizure activity on arrival with subsequent sedation. Also with h/o CVA and TIA, GERD.       SLP Plan  MBS       Recommendations  Diet recommendations: NPO;Other(comment)(a few ice chips after oral care) Liquids provided via: Teaspoon Medication Administration: Via alternative means                Oral Care Recommendations: Oral care QID Follow up Recommendations: Inpatient Rehab SLP Visit Diagnosis: Dysphagia, oropharyngeal phase (R13.12) Plan: MBS       GO              Deneise Lever, [place], [place] Pager:  [phone] Office: [phone]   Aliene Altes 04/05/2018, 10:35 AM

## 2018-04-05 NOTE — Progress Notes (Signed)
Rehab Admissions Coordinator Note:  Per PT and SLP recommendation, this patient was screened by Jhonnie Garner for appropriateness for an Inpatient Acute Rehab Consult.  At this time, we are recommending an Inpatient Rehab consult. AC will contact MD regarding request for an IP Rehab Consult Order.   Jhonnie Garner 04/05/2018, 10:16 AM  I can be reached at [phone].

## 2018-04-05 NOTE — Progress Notes (Addendum)
STROKE TEAM PROGRESS NOTE   HISTORY OF PRESENT ILLNESS (per record) Jimmy [name] is an 85 y.o. male presenting from home via EMS with sudden onset of left sided weakness. He has a history of stroke about 2 years ago with trouble talking and confusion. He takes Aggrenox at home. LKN 2200 while laying in bed per wife. At about midnight, wife noted that he was weak on the left with a facial droop. EMS arrived at 12:15 and noted the same findings. Not on a blood thinner. BP en route was 152/75, HR NSR in 70's, sats 95% on RA and CBG 107. On arrival to the ED the patient continued to be weak on his left side.   CT head in the ED revealed an acute right basal ganglia hemorrhage, most likely a hypertensive bleed.    SUBJECTIVE (INTERVAL HISTORY) His RN is at the bedside.  . Patient continues to have mild dysarthria and left hemiparesis and has been coughing.  He has not passed swallow eval.  Blood pressure appears controlled.     OBJECTIVE Vitals:   04/05/18 1345 04/05/18 1400 04/05/18 1430 04/05/18 1600  BP: (!) 144/59 (!) 149/61 (!) 146/66   Pulse: 63 64 65   Resp: 16 17 17    Temp:    97.8 F (36.6 C)  TempSrc:    Oral  SpO2: 95% 94% 97%   Weight:      Height:        CBC:  Recent Labs  Lab 04/04/18 0056 04/04/18 0102  WBC  --  7.7  NEUTROABS  --  5.2  HGB 15.0 14.2  HCT 44.0 42.4  MCV  --  96.6  PLT  --  131*    Basic Metabolic Panel:  Recent Labs  Lab 04/04/18 0056 04/04/18 0102  NA 135 134*  K 3.9 3.9  CL 104 103  CO2  --  23  GLUCOSE 95 99  BUN 20 17  CREATININE 1.40* 1.52*  CALCIUM  --  9.7    Lipid Panel:     Component Value Date/Time   CHOL 107 10/21/2017 1218   TRIG 157.0 (H) 10/21/2017 1218   HDL 36.80 (L) 10/21/2017 1218   CHOLHDL 3 10/21/2017 1218   VLDL 31.4 10/21/2017 1218   LDLCALC 39 10/21/2017 1218   HgbA1c:  Lab Results  Component Value Date   HGBA1C 6.0 10/21/2017   Urine Drug Screen:     Component Value Date/Time   LABOPIA  NONE DETECTED 04/08/2016 1500   COCAINSCRNUR NONE DETECTED 04/08/2016 1500   LABBENZ NONE DETECTED 04/08/2016 1500   AMPHETMU NONE DETECTED 04/08/2016 1500   THCU NONE DETECTED 04/08/2016 1500   LABBARB NONE DETECTED 04/08/2016 1500    Alcohol Level     Component Value Date/Time   [facility] <5 04/08/2016 1415    IMAGING  Chest Port 1 View 04/04/2018 IMPRESSION:  1. Low lung volumes with right infrahilar and left midlung atelectasis.  2. Opacity at the left costophrenic angle likely soft tissue summation accentuated by rotation.   Ct Head Code Stroke Wo Contrast 04/04/2018 IMPRESSION:  1. 8.8 cc acute intraparenchymal hematoma centered at the posterior right lentiform nucleus without significant regional mass effect. Underlying hypertensive etiology suspected.  2. Underlying advanced chronic microvascular ischemic disease with multiple remote lacunar infarcts involving the left basal ganglia and thalamus.    CT head WO contrast: 04/04/2018  1. 8.8 cc acute intraparenchymal hematoma centered at the posterior right lentiform nucleus without significant regional mass  effect. Underlying hypertensive etiology suspected. 2. Underlying advanced chronic microvascular ischemic disease with multiple remote lacunar infarcts involving the left basal ganglia and thalamus.  Repeat Ct Head Wo Contrast 04/05/2018 1. Slight interval increase in size of right basal ganglia intraparenchymal hemorrhage, now measuring 9.9 cc in volume, previously 8.8 cc on 04/04/2018. Associated vasogenic edema mildly increased without significant regional mass effect. 2. Otherwise stable appearance of the brain with no other new acute intracranial abnormality.   MRI/MRA: No change or new finding by MRI. Acute hematoma/hemorrhagic infarction in the right basal ganglia with volume of 8.6 cc. Mild surrounding edema. Extensive small vessel ischemic changes elsewhere throughout the brain as outlined above.  Negative  intracranial MR angiography of the large and medium size vessels. No evidence of high-flow vascular abnormality in the region of hemorrhage.    Bilateral Carotid Dopplers - 04/05/2018 Right Carotid: Velocities in the right ICA are consistent with a 40-59%                stenosis.  Left Carotid: Velocities in the left ICA are consistent with a 1-39% stenosis.  Vertebrals: Bilateral vertebral arteries demonstrate antegrade flow.    PHYSICAL EXAM Blood pressure (!) 146/66, pulse 65, temperature 97.8 F (36.6 C), temperature source Oral, resp. rate 17, height 5\' 10"  (1.778 m), weight 75.8 kg, SpO2 97 %.  Pleasant elderly gentleman not in distress. . Afebrile. Head is nontraumatic. Neck is supple without bruit.    Cardiac exam no murmur or gallop. Lungs are clear to auscultation. Distal pulses are well felt.  Neurological Exam :  Awake alert oriented x3.  Mild dysarthria.  No aphasia.  Extraocular movements are full range without nystagmus.  Blinks to threat bilaterally.  Mild left lower facial weakness.  Tongue midline.  Left hemiplegia with 2/5 left elbow flexion and extension otherwise 0/5.  Trace to flexion in the left foot and knee extension otherwise 0/5.  Tone is diminished on the left compared to the right.  Sensation is intact bilaterally.  Left plantar equivocal right downgoing.  Gait not tested.  ASSESSMENT/PLAN Jimmy [name] is a 85 y.o. male with history of previous strokes, Htn, Hld, CLL, CAD, CHF, and CKD presenting with left sided weakness and facial droop. He did not receive IV t-PA due to Holiday.  Acute right basal ganglia hemorrhage - hypertensive  Resultant dysarthria and left hemiplegia  CT head - 8.8 cc acute intraparenchymal hematoma centered at the posterior right lentiform nucleus - multiple remote infarcts.  MRI head - acute hematoma/hemorrhagic infarct right basal ganglia  MRA head  - not ordered  Repeat Head CT - increase right basal ganglia hemorrhage  now 9.9 cc volume previously 8.8cc  Carotid Doppler - RICA: 82-95% stenosis, LICA 6-21% stenosis  2D Echo - not performed  LDL  - not ordered  HgbA1c  - not ordered  UDS - not ordered  VTE prophylaxis - SCDs  Diet - NPO- initiated fluids 75 ml/hr- to complete MBS   dipyridamole SR 250 mg/aspirin 25 mg twice a day prior to admission, now on No antithrombotic  Ongoing aggressive stroke risk factor management  Therapy recommendations:  SLP: Diet NPO; a few ice chips. Meds via alternate route;  OT: inpatient rehab consult  Disposition:  Pending; being evaluated for inpatient rehab  Hypertension  On cleviprex drip; SBP < 160 . Long-term BP goal normotensive  Hyperlipidemia  Lipid lowering medication PTA:  Zocor 10 mg daily  Current lipid lowering medication: Zocor 10 mg  daily (will D/C for now)  Continue statin at discharge   Other Stroke Risk Factors  Advanced age  Former cigarette smoker - quit  Hx stroke/TIA   Family hx stroke (brother)  Coronary artery disease   Other Active Problems  Creatinine - 1.52  Plt - 131  Plan : Continue n.p.o. and modified barium swallow by speech pathology this afternoon.  May need Pender tube if he fails swallow again start IV fluids normal saline.  Continue strict control of blood pressure.  Therapy and rehab consults.  Long discussion with the patient and wife and answered questions. This patient is critically ill and at significant risk of neurological worsening, death and care requires constant monitoring of vital signs, hemodynamics,respiratory and cardiac monitoring, extensive review of multiple databases, frequent neurological assessment, discussion with family, other specialists and medical decision making of high complexity.I have made any additions or clarifications directly to the above note.This critical care time does not reflect procedure time, or teaching time or supervisory time of PA/NP/Med Resident etc but could  involve care discussion time.  I spent 30 minutes of neurocritical care time  in the care of  this patient.   Antony Contras, MD Medical Director [facility] Pager: [phone] 04/05/2018 7:10 PM   Hospital day # 1 .

## 2018-04-05 NOTE — Progress Notes (Signed)
Carotid duplex has been completed.   Preliminary results in CV Proc.   Abram Sander 04/05/2018 9:43 AM

## 2018-04-05 NOTE — Consult Note (Signed)
Physical Medicine and Rehabilitation Consult Reason for Consult: Left-sided weakness, rehabilitation for stroke Referring Phsyician: Sinai Mahany is an 85 y.o. male.   HPI: Patient presented on 04/02/2017 with sudden onset left-sided weakness.  Prior history of CVA 2 years ago.  CT showed an acute right basal ganglia hemorrhage felt to be hypertensive.  Patient initially on Cardene drip Speech therapy evaluation on 04/04/2018 indicates that the patient was lethargic but arousable needing moderate to max cues to sustain attention for examination.  Dysarthria due to left-sided facial weakness.  Patient placed n.p.o. because of clinical evidence of severe pharyngeal dysphasia with poor secretion management. Physical therapy evaluation on 04/04/2018, mod assist supine to sit sit to stand transfers plus to mod assist, gait was not tested.  Patient able to sit at edge of bed for 12 minutes  Review of Systems -Review of Systems  Constitutional: Negative for chills and fever.  HENT: Negative for hearing loss and nosebleeds.   Eyes: Negative for blurred vision, double vision and pain.  Respiratory: Negative for cough, shortness of breath, wheezing and stridor.   Cardiovascular: Negative for chest pain and palpitations.  Gastrointestinal: Negative for heartburn, nausea and vomiting.       NPO  Genitourinary:       Condom cath  Musculoskeletal: Negative for joint pain and myalgias.  Skin: Negative for itching and rash.  Neurological: Positive for focal weakness. Negative for dizziness, sensory change, speech change and headaches.  Endo/Heme/Allergies: Negative.   Psychiatric/Behavioral: Negative for hallucinations. The patient is not nervous/anxious.     Past Medical History:  Diagnosis Date  . CAD (coronary artery disease)    a. 04/2001 S/P CABGx 4;  b. 2008 MV:  EF 63% normal perfusion.  . Carotid stenosis    a. 11/2012 Carotid U/S:  RICA 40-98%, LICA 11-91%.  . Chronic kidney disease, stage  3 ([place])   . CLL (chronic lymphoblastic leukemia) 2009   Stage IV; Dr. Ivor Messier - referred to Dr. Lissa Merlin [facility] 07/2013 now on Rituxan (10/2013) --> stage 0 (09/2014)  . Diastolic CHF ([employer])    a. 07/7827 Echo: EF 55-65%, mild conc LVH, Gr 1 DD, mild AS, triv AI, mildly dil Ao root (3.5 cm).  . GERD (gastroesophageal reflux disease) 1990s  . History of CVA (cerebrovascular accident) 2015   by MRI - remote L internal capsule  . History of herpes genitalis    valtrex daily  . Hyperlipemia 2002  . Hypertension 2002  . Mass of submandibular region 2015   referred to gen surg after chemo  . Mild aortic stenosis    a. 07/2011 Echo: Mild AS, triv AI.  [name] Stroke ([place])   . Thrombocytopenia ([place])    outpatient goals Hgb >9, plt >20  . TIA (transient ischemic attack)    04/2016   Past Surgical History:  Procedure Laterality Date  . ANGIOPLASTY  1998  . CATARACT EXTRACTION, BILATERAL     R 1/09, L 8/09  . COLONOSCOPY  11/29/1987   Normal  . COLONOSCOPY  02/07/2003   Hemm. Internal, focal proctitis, negative biopsy  . COLONOSCOPY  12/12/2004   Internal external hemorrhoids, +proctits, negative biopsy  . CORONARY ANGIOPLASTY  1998  . CORONARY ARTERY BYPASS GRAFT  04/23/2001   x4, Dr. Pia Mau  . ESOPHAGOGASTRODUODENOSCOPY  11/29/1987   Gastritis  . ETT  12/10/2006   Persantine myoview nml  . HEMORROIDECTOMY  1954   Fissure repair, [place]  . [place]  . KIDNEY STONE SURGERY  1977   Dr Redmond Baseman  . LITHOTRIPSY  1990s   Multiple  . US ECHOCARDIOGRAPHY  07/2011   nl sys fxn, EF 55-60%, grade 1 diast dysfunction, mild AS, mildly elevated PA pressure   Family History  Problem Relation Age of Onset  . Hypertension Mother   . Heart disease Mother   . Hyperlipidemia Mother   . Hypertension Father   . Hyperlipidemia Father   . Kidney cancer Sister        Renal cell cancer  . Alcohol abuse Brother   . Diabetes Brother   . Stroke Brother   . Heart attack  Brother        MI  . Diabetes Other        Sister's daughter  . Depression Daughter        Bipolar   Social History:  reports that he quit smoking about 39 years ago. He has a 60.00 pack-year smoking history. He has never used smokeless tobacco. He reports that he does not drink alcohol or use drugs. Allergies:  Allergies  Allergen Reactions  . New Skin Other (See Comments)    unknown  . Tape Dermatitis   Medications Prior to Admission  Medication Sig Dispense Refill  . allopurinol (ZYLOPRIM) 300 MG tablet TAKE 1 TABLET DAILY (Patient taking differently: Take 300 mg by mouth daily. ) 90 tablet 1  . cholecalciferol (VITAMIN D) 1000 units tablet Take 2,000 Units by mouth daily.    . diphenhydrAMINE (BENADRYL ALLERGY) 25 mg capsule Take 25 mg by mouth daily as needed for itching, allergies or sleep.     . dipyridamole-aspirin (AGGRENOX) 200-25 MG 12hr capsule TAKE 1 CAPSULE TWICE A DAY (Patient taking differently: Take 1 capsule by mouth 2 (two) times daily. ) 180 capsule 1  . lisinopril (PRINIVIL,ZESTRIL) 5 MG tablet TAKE 1 TABLET DAILY (NEW DOSE) (Patient taking differently: Take 5 mg by mouth daily. ) 90 tablet 3  . nitroGLYCERIN (NITROSTAT) 0.4 MG SL tablet Place 1 tablet (0.4 mg total) under the tongue every 5 (five) minutes as needed for chest pain (max 3 doses in 15 minutes). 25 tablet 3  . simvastatin (ZOCOR) 10 MG tablet TAKE 1 TABLET AT BEDTIME (Patient taking differently: Take 10 mg by mouth daily. ) 90 tablet 3  . valACYclovir (VALTREX) 500 MG tablet TAKE 1 TABLET DAILY (Patient taking differently: Take 500 mg by mouth daily as needed (outbreak). ) 90 tablet 4  . vitamin B-12 (CYANOCOBALAMIN) 1000 MCG tablet Take 1 tablet (1,000 mcg total) by mouth daily.      Home: Home Living Family/patient expects to be discharged to:: Private residence Living Arrangements: Spouse/significant other Available Help at Discharge: Family Type of Home: House Home Access: Stairs to  enter [employer] of Steps: 4 in front and in garage, ramp on back Entrance Stairs-Rails: Right, Left(in garage only on lt side) Home Layout: One level Bathroom Shower/Tub: Multimedia programmer: [place]: Environmental consultant - 2 wheels, Shower seat, Hand held shower head  Functional History: Prior Function Comments: goes to gym every morning Functional Status:  Mobility:     Ambulation/Gait General Gait Details: Unable    ADL:    Cognition: Cognition Overall Cognitive Status: Impaired/Different from baseline Orientation Level: Oriented X4 Cognition Arousal/Alertness: Awake/alert Behavior During Therapy: Impulsive Overall Cognitive Status: Impaired/Different from baseline Area of Impairment: Memory, Following commands, Safety/judgement, Awareness, Problem solving, Attention Current Attention Level: Selective Memory: Decreased short-term memory  Following Commands: Follows one step commands consistently Safety/Judgement: Decreased awareness of safety, Decreased awareness of deficits Awareness: Intellectual Problem Solving: Requires verbal cues, Requires tactile cues General Comments: Lt neglect/inattention  Blood pressure 138/65, pulse 70, temperature 97.9 F (36.6 C), temperature source Oral, resp. rate (!) 23, height 5\' 10"  (1.778 m), weight 75.8 kg, SpO2 93 %. Physical Exam Visual fields are intact confrontation testing, extraocular muscles are intact General: No acute distress Mood and affect are appropriate Heart: Regular rate and rhythm no rubs murmurs or extra sounds Lungs: Clear to auscultation, breathing unlabored, no rales or wheezes Abdomen: Positive bowel sounds, soft nontender to palpation, nondistended Extremities: No clubbing, cyanosis, or edema Skin: No evidence of breakdown, no evidence of rash Neurologic: Cranial nerves II through XII intact, motor strength is 5/5 in bilateral deltoid, bicep, tricep, grip, hip flexor, knee  extensors, ankle dorsiflexor and plantar flexor Sensory exam normal sensation to light touch  in bilateral upper and lower extremities Motor strength is 3- left deltoid 4- bicep tricep finger flexors and extensors with decreased motor control in the left side 3- at the left hip flexor knee extensor ankle dorsiflexor on the left 5/5 strength on the right side Musculoskeletal: Full range of motion in all 4 extremities. No joint swelling  No results found for this or any previous visit (from the past 24 hour(s)). Ct Head Wo Contrast  Result Date: 04/05/2018 CLINICAL DATA:  Follow-up examination for acute intracranial hemorrhage, stroke. EXAM: CT HEAD WITHOUT CONTRAST TECHNIQUE: Contiguous axial images were obtained from the base of the skull through the vertex without intravenous contrast. COMPARISON:  Prior MRI and CT from 04/04/2018. FINDINGS: Brain: Intraparenchymal hemorrhage centered at the posterior right lentiform nucleus slightly increased in size measuring 3.2 x 2.2 x 2.8 cm (estimated volume 9.9 cc, previously 8.8 cc on prior CT). Localized vasogenic edema and regional mass effect slightly increased from previous. No intraventricular extension or other complication. No significant midline shift. No other new acute intracranial hemorrhage. No other acute large vessel territory infarct. Underlying atrophy with advanced chronic microvascular ischemic disease again noted. No hydrocephalus. Basilar cisterns remain patent. No extra-axial fluid collection. Vascular: No hyperdense vessel. Calcified atherosclerosis noted at the skull base. Skull: Scalp soft tissues and calvarium within normal limits. Sinuses/Orbits: Globes and orbital soft tissues within normal limits. Small air-fluid level within the left maxillary sinus with scattered mucosal thickening within the ethmoidal air cells, similar to previous. Trace right mastoid effusion noted, of doubtful significance. Other: None. IMPRESSION: 1. Slight  interval increase in size of right basal ganglia intraparenchymal hemorrhage, now measuring 9.9 cc in volume, previously 8.8 cc on 04/04/2018. Associated vasogenic edema mildly increased without significant regional mass effect. 2. Otherwise stable appearance of the brain with no other new acute intracranial abnormality. Electronically Signed   By: Jeannine Boga M.D.   On: 04/05/2018 01:41   Mr Jodene Nam Head Wo Contrast  Result Date: 04/04/2018 CLINICAL DATA:  Sudden onset of left-sided weakness. Stroke 2 years ago. Right brain hemorrhage. EXAM: MRI HEAD WITHOUT CONTRAST MRA HEAD WITHOUT CONTRAST TECHNIQUE: Multiplanar, multiecho pulse sequences of the brain and surrounding structures were obtained without intravenous contrast. Angiographic images of the head were obtained using MRA technique without contrast. COMPARISON:  Head CT same day.  MRI 04/09/2016. FINDINGS: MRI HEAD FINDINGS Brain: Chronic small-vessel ischemic changes affect the pons. No focal cerebellar insult. Old lacunar infarction affects the left thalamus. Old lacunar infarctions within the left basal ganglia. Extensive chronic small-vessel ischemic changes affecting the  cerebral hemispheric white matter on each side. Hematoma/hemorrhagic infarction in the right basal ganglia is redemonstrated, the hematoma measuring 3.1 x 2.3 x 2.3 cm (volume = 8.6 cm^3), unchanged. Surrounding edema appears similar. Old hemosiderin deposition in the right caudate head. No significant mass effect. No midline shift. No hydrocephalus or extra-axial collection. Vascular: Major vessels at the base of the brain show flow. Skull and upper cervical spine: Negative Sinuses/Orbits: Clear except for a small amount of fluid in the left maxillary sinus. Orbits negative. Other: None MRA HEAD FINDINGS Both internal carotid arteries are widely patent through the skull base and siphon regions. The anterior and middle cerebral vessels appear normal without proximal stenosis,  aneurysm or vascular malformation. Both vertebral arteries are widely patent to the basilar. No basilar stenosis. Posterior circulation branch vessels appear normal. IMPRESSION: No change or new finding by MRI. Acute hematoma/hemorrhagic infarction in the right basal ganglia with volume of 8.6 cc. Mild surrounding edema. Extensive small vessel ischemic changes elsewhere throughout the brain as outlined above. Negative intracranial MR angiography of the large and medium size vessels. No evidence of high-flow vascular abnormality in the region of hemorrhage. Electronically Signed   By: Nelson Chimes M.D.   On: 04/04/2018 11:56   Mr Brain Wo Contrast  Result Date: 04/04/2018 CLINICAL DATA:  Sudden onset of left-sided weakness. Stroke 2 years ago. Right brain hemorrhage. EXAM: MRI HEAD WITHOUT CONTRAST MRA HEAD WITHOUT CONTRAST TECHNIQUE: Multiplanar, multiecho pulse sequences of the brain and surrounding structures were obtained without intravenous contrast. Angiographic images of the head were obtained using MRA technique without contrast. COMPARISON:  Head CT same day.  MRI 04/09/2016. FINDINGS: MRI HEAD FINDINGS Brain: Chronic small-vessel ischemic changes affect the pons. No focal cerebellar insult. Old lacunar infarction affects the left thalamus. Old lacunar infarctions within the left basal ganglia. Extensive chronic small-vessel ischemic changes affecting the cerebral hemispheric white matter on each side. Hematoma/hemorrhagic infarction in the right basal ganglia is redemonstrated, the hematoma measuring 3.1 x 2.3 x 2.3 cm (volume = 8.6 cm^3), unchanged. Surrounding edema appears similar. Old hemosiderin deposition in the right caudate head. No significant mass effect. No midline shift. No hydrocephalus or extra-axial collection. Vascular: Major vessels at the base of the brain show flow. Skull and upper cervical spine: Negative Sinuses/Orbits: Clear except for a small amount of fluid in the left maxillary  sinus. Orbits negative. Other: None MRA HEAD FINDINGS Both internal carotid arteries are widely patent through the skull base and siphon regions. The anterior and middle cerebral vessels appear normal without proximal stenosis, aneurysm or vascular malformation. Both vertebral arteries are widely patent to the basilar. No basilar stenosis. Posterior circulation branch vessels appear normal. IMPRESSION: No change or new finding by MRI. Acute hematoma/hemorrhagic infarction in the right basal ganglia with volume of 8.6 cc. Mild surrounding edema. Extensive small vessel ischemic changes elsewhere throughout the brain as outlined above. Negative intracranial MR angiography of the large and medium size vessels. No evidence of high-flow vascular abnormality in the region of hemorrhage. Electronically Signed   By: Nelson Chimes M.D.   On: 04/04/2018 11:56   Chest Port 1 View  Result Date: 04/04/2018 CLINICAL DATA:  Intracranial hemorrhage. EXAM: PORTABLE CHEST 1 VIEW COMPARISON:  None. FINDINGS: Patient is rotated. Low lung volumes. Post median sternotomy and CABG. Heart size upper normal, likely accentuated by technique. Streaky right infrahilar and left midlung atelectasis. Opacity at the left costophrenic angle likely accentuated by overlapping soft tissue structures. No pulmonary edema. No  evidence of pleural effusion. No pneumothorax. IMPRESSION: 1. Low lung volumes with right infrahilar and left midlung atelectasis. 2. Opacity at the left costophrenic angle likely soft tissue summation accentuated by rotation. Electronically Signed   By: Keith Rake M.D.   On: 04/04/2018 02:43   Ct Head Code Stroke Wo Contrast  Result Date: 04/04/2018 CLINICAL DATA:  Code stroke. Initial evaluation for acute left-sided facial droop and weakness. EXAM: CT HEAD WITHOUT CONTRAST TECHNIQUE: Contiguous axial images were obtained from the base of the skull through the vertex without intravenous contrast. COMPARISON:  Prior MRI  from 04/09/2016. FINDINGS: Brain: Acute intraparenchymal hemorrhage centered at the posterior right lentiform nucleus/external capsule measures 2.7 x 2.5 x 2.6 cm (estimated volume 8.8 cc). Mild localized vasogenic edema without significant regional mass effect. No intraventricular extension or other complication. No other acute intracranial hemorrhage. No other acute large vessel territory infarct. Underlying moderate to advanced chronic microvascular ischemic disease noted. Multiple remote lacunar infarcts seen involving the left basal ganglia and left thalamus. No mass lesion or midline shift. No hydrocephalus. No extra-axial fluid collection. Vascular: No asymmetric hyperdense vessel. Calcified atherosclerosis at the skull base. Skull: Scalp soft tissues and calvarium within normal limits. Sinuses/Orbits: Globes and orbital soft tissues demonstrate no acute finding. Mild mucosal thickening and opacity within the ethmoidal air cells and left maxillary sinus. Mastoid air cells are clear. Other: None. IMPRESSION: 1. 8.8 cc acute intraparenchymal hematoma centered at the posterior right lentiform nucleus without significant regional mass effect. Underlying hypertensive etiology suspected. 2. Underlying advanced chronic microvascular ischemic disease with multiple remote lacunar infarcts involving the left basal ganglia and thalamus. These results were communicated to [place] at 1:16 amon 1/4/2020by text page via the [facility] messaging system. Electronically Signed   By: Jeannine Boga M.D.   On: 04/04/2018 01:18   Vas US Carotid  Result Date: 04/05/2018 Carotid Arterial Duplex Study Indications:       CVA. Risk Factors:      Hypertension, hyperlipidemia, coronary artery disease. Comparison Study:  Previous study on 04/23/17. Rt 40-59%, Lt 1-39% Performing Technologist: Abram Sander RVS  Examination Guidelines: A complete evaluation includes B-mode imaging, spectral Doppler, color Doppler, and power Doppler as  needed of all accessible portions of each vessel. Bilateral testing is considered an integral part of a complete examination. Limited examinations for reoccurring indications may be performed as noted.  Right Carotid Findings: +----------+--------+--------+--------+-----------+--------+           PSV cm/sEDV cm/sStenosisDescribe   Comments +----------+--------+--------+--------+-----------+--------+ CCA Prox  112     14              homogeneous         +----------+--------+--------+--------+-----------+--------+ CCA Distal100     14              homogeneous         +----------+--------+--------+--------+-----------+--------+ ICA Prox  225     41      40-59%  homogeneous         +----------+--------+--------+--------+-----------+--------+ ICA Distal77      17                                  +----------+--------+--------+--------+-----------+--------+ ECA       358             >50%                        +----------+--------+--------+--------+-----------+--------+ +----------+--------+-------+--------+-------------------+  PSV cm/sEDV cmsDescribeArm Pressure (mmHG) +----------+--------+-------+--------+-------------------+ Subclavian101                                        +----------+--------+-------+--------+-------------------+ +---------+--------+--+--------+-+---------+ VertebralPSV cm/s32EDV cm/s7Antegrade +---------+--------+--+--------+-+---------+  Left Carotid Findings: +----------+--------+--------+--------+-----------+--------+           PSV cm/sEDV cm/sStenosisDescribe   Comments +----------+--------+--------+--------+-----------+--------+ CCA Prox  117     17              homogeneous         +----------+--------+--------+--------+-----------+--------+ CCA Distal99      16              homogeneous         +----------+--------+--------+--------+-----------+--------+ ICA Prox  111     25      1-39%    homogeneous         +----------+--------+--------+--------+-----------+--------+ ICA Distal69      19                                  +----------+--------+--------+--------+-----------+--------+ ECA       119     10                                  +----------+--------+--------+--------+-----------+--------+ +----------+--------+--------+--------+-------------------+ SubclavianPSV cm/sEDV cm/sDescribeArm Pressure (mmHG) +----------+--------+--------+--------+-------------------+           117                                         +----------+--------+--------+--------+-------------------+ +---------+--------+--+--------+-+---------+ VertebralPSV cm/s50EDV cm/s7Antegrade +---------+--------+--+--------+-+---------+  Summary: Right Carotid: Velocities in the right ICA are consistent with a 40-59%                stenosis. Left Carotid: Velocities in the left ICA are consistent with a 1-39% stenosis. Vertebrals: Bilateral vertebral arteries demonstrate antegrade flow. *See table(s) above for measurements and observations.     Preliminary     Assessment/Plan:  Assessment/Plan: Diagnosis: Right basal ganglia hemorrhage with left hemiparesis and dysphasia 1. Does the need for close, 24 hr/day medical supervision in concert with the patient's rehab needs make it unreasonable for this patient to be served in a less intensive setting? Yes 2. Co-Morbidities requiring supervision/potential complications: [place] artery disease, history of congestive heart failure, hypertension 3. Due to bladder management, bowel management, safety, skin/wound care, disease management, medication administration, pain management and patient education, does the patient require 24 hr/day rehab nursing? Yes 4. Does the patient require coordinated care of a physician, rehab nurse, PT (1-2 hrs/day, 5 days/week), OT (1-2 hrs/day, 5 days/week) and SLP (.5-1 hrs/day, 5 days/week) to address physical and  functional deficits in the context of the above medical diagnosis(es)? Yes Addressing deficits in the following areas: balance, endurance, locomotion, strength, transferring, bowel/bladder control, bathing, dressing, feeding, grooming, toileting, swallowing and psychosocial support 5. Can the patient actively participate in an intensive therapy program of at least 3 hrs of therapy per day at least 5 days per week? Yes 6. The potential for patient to make measurable gains while on inpatient rehab is excellent 7. Anticipated functional outcomes upon discharge from inpatient rehab are supervision  with PT, supervision with OT, supervision with SLP. 8.  Estimated rehab length of stay to reach the above functional goals is: 14 to 18 days 9. Anticipated D/C setting: Home 10. Anticipated post D/C treatments: [place] therapy 11. Overall Rehab/Functional Prognosis: excellent  RECOMMENDATIONS: This patient's condition is appropriate for continued rehabilitative care in the following setting: CIR Patient has agreed to participate in recommended program. Yes Note that insurance prior authorization may be required for reimbursement for recommended care.  Comment: Will be getting modified barium swallow in a.m.

## 2018-04-06 ENCOUNTER — Other Ambulatory Visit: Payer: Self-pay

## 2018-04-06 ENCOUNTER — Encounter (HOSPITAL_COMMUNITY): Payer: Self-pay | Admitting: *Deleted

## 2018-04-06 ENCOUNTER — Inpatient Hospital Stay (HOSPITAL_COMMUNITY): Payer: Medicare Other

## 2018-04-06 LAB — BASIC METABOLIC PANEL
Anion gap: 7 (ref 5–15)
BUN: 22 mg/dL (ref 8–23)
CO2: 21 mmol/L — ABNORMAL LOW (ref 22–32)
Calcium: 9.2 mg/dL (ref 8.9–10.3)
Chloride: 107 mmol/L (ref 98–111)
Creatinine, Ser: 1.25 mg/dL — ABNORMAL HIGH (ref 0.61–1.24)
GFR calc Af Amer: 60 mL/min — ABNORMAL LOW (ref >60)
GFR calc non Af Amer: 51 mL/min — ABNORMAL LOW (ref >60)
Glucose, Bld: 98 mg/dL (ref 70–99)
Potassium: 3.9 mmol/L (ref 3.5–5.1)
Sodium: 135 mmol/L (ref 135–145)

## 2018-04-06 LAB — CBC
HCT: 41.3 % (ref 39.0–52.0)
Hemoglobin: 14.3 g/dL (ref 13.0–17.0)
MCH: 33.4 pg (ref 26.0–34.0)
MCHC: 34.6 g/dL (ref 30.0–36.0)
MCV: 96.5 fL (ref 80.0–100.0)
Platelets: 146 10*3/uL — ABNORMAL LOW (ref 150–400)
RBC: 4.28 MIL/uL (ref 4.22–5.81)
RDW: 12.9 % (ref 11.5–15.5)
WBC: 7.2 10*3/uL (ref 4.0–10.5)
nRBC: 0 % (ref 0.0–0.2)

## 2018-04-06 MED ORDER — LABETALOL HCL 5 MG/ML IV SOLN
10.0000 mg | INTRAVENOUS | Status: DC | PRN
  Administered 2018-04-06: 10 mg via INTRAVENOUS
  Filled 2018-04-06: qty 4

## 2018-04-06 MED ORDER — RESOURCE THICKENUP CLEAR PO POWD
ORAL | Status: DC | PRN
  Filled 2018-04-06 (×2): qty 125

## 2018-04-06 NOTE — Progress Notes (Signed)
Physical Therapy Treatment Patient Details Name: Jimmy [name] MRN: [id number] DOB: 1928/09/24 Today's Date: 04/06/2018    History of Present Illness Pt adm with lt sided weakness and CT showed acute right basal ganglia hemorrhage. PMH - HTN, CLL, CABG, carotid stenosis, CVA, CAD, CHF    PT Comments    Pt very pleasant and eager to move. Pt with improved balance, transfers and able to take steps with facilitation and assist for weight shifting. Pt cued for attention to left, increased use of left leg and midline positioning. Pt encouraged by improvements and recommend daily transfers with nursing with left knee blocked.    Follow Up Recommendations  CIR     Equipment Recommendations  Other (comment)(hemiwalker)    Recommendations for Other Services Rehab consult     Precautions / Restrictions Precautions Precautions: Fall Precaution Comments: left hemiparesis, left inattention    Mobility  Bed Mobility Overal bed mobility: Needs Assistance Bed Mobility: Rolling;Sidelying to Sit Rolling: Min assist Sidelying to sit: Min assist       General bed mobility comments: cues for sequence with assist to direct attention and neck rotation to left, reach for rail and bend RLE hooking LLE to bring legs off bed.   Transfers Overall transfer level: Needs assistance   Transfers: Sit to/from Stand Sit to Stand: Min assist;+2 safety/equipment         General transfer comment: min assist to rise from bed and chair with increased time. cues for hand placement. Mod assist once standing for balance and weight shift  Ambulation/Gait Ambulation/Gait assistance: Mod assist;+2 safety/equipment Gait Distance (Feet): 5 Feet Assistive device: 1 person hand held assist Gait Pattern/deviations: Step-to pattern;Narrow base of support   Gait velocity interpretation: <1.31 ft/sec, indicative of household ambulator General Gait Details: pt facing therapist with RUE on therapist arm and LUE  tucked in therapist axilla pt with assist and cues to weight shift right with pt able to advance LLE with cueing. Pt with difficulty moving LLE with turning and required assist to position leg. 2 trials of grossly 5' each with chair follow and 2 person assist for lines, chair, safety and positioning   Stairs             Wheelchair Mobility    Modified Rankin (Stroke Patients Only) Modified Rankin (Stroke Patients Only) Pre-Morbid Rankin Score: No symptoms Modified Rankin: Moderately severe disability     Balance Overall balance assessment: Needs assistance Sitting-balance support: Bilateral upper extremity supported;Feet supported Sitting balance-Leahy Scale: Poor Sitting balance - Comments: EOB 5 min with pt able to progress from mod assist to minguard for short periods with cues for posture and position with bil UE in lap. Pt performed propping on LUE x 5 with assist to transition back to midline Postural control: Left lateral lean;Posterior lean Standing balance support: Bilateral upper extremity supported Standing balance-Leahy Scale: Poor Standing balance comment: mod assist for standing balance                            Cognition Arousal/Alertness: Awake/alert Behavior During Therapy: WFL for tasks assessed/performed Overall Cognitive Status: Impaired/Different from baseline                         Following Commands: Follows one step commands consistently Safety/Judgement: Decreased awareness of safety;Decreased awareness of deficits   Problem Solving: Requires verbal cues;Requires tactile cues General Comments: Lt inattention  Exercises      General Comments        Pertinent Vitals/Pain Pain Assessment: No/denies pain    Home Living                      Prior Function            PT Goals (current goals can now be found in the care plan section) Progress towards PT goals: Progressing toward goals     Frequency           PT Plan Current plan remains appropriate    Co-evaluation PT/OT/SLP Co-Evaluation/Treatment: Yes Reason for Co-Treatment: Complexity of the patient's impairments (multi-system involvement);For patient/therapist safety;To address functional/ADL transfers PT goals addressed during session: Mobility/safety with mobility;Balance        AM-PAC PT "6 Clicks" Mobility   Outcome Measure  Help needed turning from your back to your side while in a flat bed without using bedrails?: A Little Help needed moving from lying on your back to sitting on the side of a flat bed without using bedrails?: A Little Help needed moving to and from a bed to a chair (including a wheelchair)?: A Lot Help needed standing up from a chair using your arms (e.g., wheelchair or bedside chair)?: A Lot Help needed to walk in hospital room?: A Lot Help needed climbing 3-5 steps with a railing? : Total 6 Click Score: 13    End of Session Equipment Utilized During Treatment: Gait belt Activity Tolerance: Patient tolerated treatment well Patient left: in chair;with call bell/phone within reach;with family/visitor present;with nursing/sitter in room;with chair alarm set Nurse Communication: Mobility status PT Visit Diagnosis: Other abnormalities of gait and mobility (R26.89);Hemiplegia and hemiparesis Hemiplegia - Right/Left: Left Hemiplegia - dominant/non-dominant: Non-dominant Hemiplegia - caused by: Nontraumatic intracerebral hemorrhage     Time: 0822-0849 PT Time Calculation (min) (ACUTE ONLY): 27 min  Charges:  $Gait Training: 8-22 mins                     [place], PT Acute Rehabilitation Services Pager: [phone] Office: [place] 04/06/2018, 9:00 AM

## 2018-04-06 NOTE — Progress Notes (Signed)
STROKE TEAM PROGRESS NOTE      SUBJECTIVE (INTERVAL HISTORY) His RN is at the bedside.  . Patient continues to have mild dysarthria and left hemiparesis and has been coughing.  He has not passed swallow eval.which is scheduled for later this am.  Blood pressure appears controlled. He is off the cleviprex drip    OBJECTIVE Vitals:   04/06/18 1100 04/06/18 1200 04/06/18 1300 04/06/18 1400  BP: (!) 148/75 (!) 143/68 (!) 148/65 (!) 152/87  Pulse: 71 63 70 64  Resp: 14 15 15 17   Temp:  (!) 97.2 F (36.2 C)    TempSrc:  Oral    SpO2: 93% 94% 97% 98%  Weight:      Height:        CBC:  Recent Labs  Lab 04/04/18 0102 04/06/18 0612  WBC 7.7 7.2  NEUTROABS 5.2  --   HGB 14.2 14.3  HCT 42.4 41.3  MCV 96.6 96.5  PLT 131* 146*    Basic Metabolic Panel:  Recent Labs  Lab 04/04/18 0102 04/06/18 0612  NA 134* 135  K 3.9 3.9  CL 103 107  CO2 23 21*  GLUCOSE 99 98  BUN 17 22  CREATININE 1.52* 1.25*  CALCIUM 9.7 9.2    Lipid Panel:     Component Value Date/Time   CHOL 107 10/21/2017 1218   TRIG 157.0 (H) 10/21/2017 1218   HDL 36.80 (L) 10/21/2017 1218   CHOLHDL 3 10/21/2017 1218   VLDL 31.4 10/21/2017 1218   LDLCALC 39 10/21/2017 1218   HgbA1c:  Lab Results  Component Value Date   HGBA1C 6.0 10/21/2017   Urine Drug Screen:     Component Value Date/Time   LABOPIA NONE DETECTED 04/08/2016 1500   COCAINSCRNUR NONE DETECTED 04/08/2016 1500   LABBENZ NONE DETECTED 04/08/2016 1500   AMPHETMU NONE DETECTED 04/08/2016 1500   THCU NONE DETECTED 04/08/2016 1500   LABBARB NONE DETECTED 04/08/2016 1500    Alcohol Level     Component Value Date/Time   [facility] <5 04/08/2016 1415    IMAGING  Chest Port 1 View 04/04/2018 IMPRESSION:  1. Low lung volumes with right infrahilar and left midlung atelectasis.  2. Opacity at the left costophrenic angle likely soft tissue summation accentuated by rotation.   Ct Head Code Stroke Wo Contrast 04/04/2018 IMPRESSION:  1. 8.8 cc  acute intraparenchymal hematoma centered at the posterior right lentiform nucleus without significant regional mass effect. Underlying hypertensive etiology suspected.  2. Underlying advanced chronic microvascular ischemic disease with multiple remote lacunar infarcts involving the left basal ganglia and thalamus.    CT head WO contrast: 04/04/2018  1. 8.8 cc acute intraparenchymal hematoma centered at the posterior right lentiform nucleus without significant regional mass effect. Underlying hypertensive etiology suspected. 2. Underlying advanced chronic microvascular ischemic disease with multiple remote lacunar infarcts involving the left basal ganglia and thalamus.  Repeat Ct Head Wo Contrast 04/05/2018 1. Slight interval increase in size of right basal ganglia intraparenchymal hemorrhage, now measuring 9.9 cc in volume, previously 8.8 cc on 04/04/2018. Associated vasogenic edema mildly increased without significant regional mass effect. 2. Otherwise stable appearance of the brain with no other new acute intracranial abnormality.   MRI/MRA: No change or new finding by MRI. Acute hematoma/hemorrhagic infarction in the right basal ganglia with volume of 8.6 cc. Mild surrounding edema. Extensive small vessel ischemic changes elsewhere throughout the brain as outlined above.  Negative intracranial MR angiography of the large and medium size vessels. No evidence of  high-flow vascular abnormality in the region of hemorrhage.    Bilateral Carotid Dopplers - 04/05/2018 Right Carotid: Velocities in the right ICA are consistent with a 40-59%                stenosis.  Left Carotid: Velocities in the left ICA are consistent with a 1-39% stenosis.  Vertebrals: Bilateral vertebral arteries demonstrate antegrade flow.    PHYSICAL EXAM Blood pressure (!) 152/87, pulse 64, temperature (!) 97.2 F (36.2 C), temperature source Oral, resp. rate 17, height 5\' 10"  (1.778 m), weight 75.8 kg,  SpO2 98 %.  Pleasant elderly gentleman not in distress. . Afebrile. Head is nontraumatic. Neck is supple without bruit.    Cardiac exam no murmur or gallop. Lungs are clear to auscultation. Distal pulses are well felt.  Neurological Exam :  Awake alert oriented x3.  Mild dysarthria.  No aphasia.  Extraocular movements are full range without nystagmus.  Blinks to threat bilaterally.  Mild left lower facial weakness.  Tongue midline.  Left hemiplegia with 2/5 left elbow flexion and extension otherwise 0/5.  Trace to flexion in the left foot and knee extension otherwise 0/5.  Tone is diminished on the left compared to the right.  Sensation is intact bilaterally.  Left plantar equivocal right downgoing.  Gait not tested.  ASSESSMENT/PLAN [name] is a 85 y.o. male with history of previous strokes, Htn, Hld, CLL, CAD, CHF, and CKD presenting with left sided weakness and facial droop. He did not receive IV t-PA due to [place].  Acute right basal ganglia hemorrhage - hypertensive  Resultant dysarthria and left hemiplegia  CT head - 8.8 cc acute intraparenchymal hematoma centered at the posterior right lentiform nucleus - multiple remote infarcts.  MRI head - acute hematoma/hemorrhagic infarct right basal ganglia  MRA head  - not ordered  Repeat Head CT - increase right basal ganglia hemorrhage now 9.9 cc volume previously 8.8cc  Carotid Doppler - RICA: 01-75% stenosis, LICA 1-02% stenosis  2D Echo - not performed  LDL  - not ordered  HgbA1c  - not ordered  UDS - not ordered  VTE prophylaxis - SCDs  Diet - NPO- initiated fluids 75 ml/hr- to complete MBS   dipyridamole SR 250 mg/aspirin 25 mg twice a day prior to admission, now on No antithrombotic  Ongoing aggressive stroke risk factor management  Therapy recommendations:  SLP: Diet NPO; a few ice chips. Meds via alternate route;  OT: inpatient rehab consult  Disposition:  Pending; being evaluated for inpatient  rehab  Hypertension  On cleviprex drip; SBP < 160 . Long-term BP goal normotensive  Hyperlipidemia  Lipid lowering medication PTA:  Zocor 10 mg daily  Current lipid lowering medication: Zocor 10 mg daily (will D/C for now)  Continue statin at discharge   Other Stroke Risk Factors  Advanced age  Former cigarette smoker - quit  Hx stroke/TIA   Family hx stroke (brother)  Coronary artery disease   Other Active Problems  Creatinine - 1.52  Plt - 131  Plan : Continue n.p.o. and modified barium swallow by speech pathology this afternoon.  May need Panda tube if he fails swallow again start IV fluids normal saline.  Continue strict control of blood pressure.  Therapy and rehab consults.Transfer the patient to neurology floor bed today.  Long discussion with the patient and wife and answered questions. This patient is critically ill and at significant risk of neurological worsening, death and care requires constant monitoring of vital  signs, hemodynamics,respiratory and cardiac monitoring, extensive review of multiple databases, frequent neurological assessment, discussion with family, other specialists and medical decision making of high complexity.I have made any additions or clarifications directly to the above note.This critical care time does not reflect procedure time, or teaching time or supervisory time of PA/NP/Med Resident etc but could involve care discussion time.  I spent 30 minutes of neurocritical care time  in the care of  this patient.   Antony Contras, MD Medical Director [facility] Pager: [phone] 04/06/2018 3:12 PM   Hospital day # 2 .

## 2018-04-06 NOTE — Progress Notes (Signed)
IP rehab admissions - I am following for potential acute inpatient rehab admission.  Currently patient is on a Cleviprex drip.  Not ready for CIR today.  I will check back for progress in am.  Call me for questions.  [phone]

## 2018-04-06 NOTE — Evaluation (Signed)
Occupational Therapy Evaluation Patient Details Name: Jimmy [name] MRN: [id number] DOB: [birth year] Today's Date: 04/06/2018    History of Present Illness Pt adm with lt sided weakness and CT showed acute right basal ganglia hemorrhage. PMH - HTN, CLL, CABG, carotid stenosis, CVA, CAD, CHF   Clinical Impression   PTA patient independent.  Admitted for above and limited by problem list below, including sequencing, motor planning, L hemiparesis and impaired balance affecting ADls, IADLs and mobility.  Requires mod assist for grooming, max assist for UB ADLs, and max- total assist +2 for LB ADLs. Patient will benefit from continued OT services while admitted and after dc at CIR level in order to optimize independence and return to PLOF.  Will continue to follow.     Follow Up Recommendations  CIR    Equipment Recommendations  3 in 1 bedside commode    Recommendations for Other Services Rehab consult     Precautions / Restrictions Precautions Precautions: Fall Precaution Comments: left hemiparesis, left inattention Restrictions Weight Bearing Restrictions: No      Mobility Bed Mobility Overal bed mobility: Needs Assistance Bed Mobility: Rolling;Sidelying to Sit Rolling: Min assist Sidelying to sit: Min assist       General bed mobility comments: cues for sequence with assist to direct attention and neck rotation to left, reach for rail and bend RLE hooking LLE to bring legs off bed.   Transfers Overall transfer level: Needs assistance Equipment used: 1 person hand held assist;2 person hand held assist Transfers: Sit to/from Stand Sit to Stand: Min assist;+2 safety/equipment         General transfer comment: min assist to rise from bed and chair with increased time. cues for hand placement. Mod assist once standing for balance and weight shift    Balance Overall balance assessment: Needs assistance Sitting-balance support: Bilateral upper extremity supported;Feet  supported Sitting balance-Leahy Scale: Poor Sitting balance - Comments: EOB 5 min with pt able to progress from mod assist to minguard for short periods with cues for posture and position with bil UE in lap. Pt performed propping on LUE x 5 with assist to transition back to midline Postural control: Left lateral lean;Posterior lean Standing balance support: Bilateral upper extremity supported Standing balance-Leahy Scale: Poor Standing balance comment: mod assist for standing balance                           ADL either performed or assessed with clinical judgement   ADL Overall ADL's : Needs assistance/impaired Eating/Feeding: NPO   Grooming: Moderate assistance;Wash/dry hands;Wash/dry face;Sitting Grooming Details (indicate cue type and reason): hand over hand support to wash R hand  Upper Body Bathing: Moderate assistance;Sitting Upper Body Bathing Details (indicate cue type and reason): hand over hand assist to wash R UE with L  Lower Body Bathing: +2 for physical assistance;Maximal assistance;Sit to/from stand   Upper Body Dressing : Maximal assistance;Sitting   Lower Body Dressing: Maximal assistance;+2 for physical assistance;Sit to/from stand   Toilet Transfer: Minimal assistance;+2 for physical assistance;Ambulation Toilet Transfer Details (indicate cue type and reason): simulated to recliner          Functional mobility during ADLs: Minimal assistance;+2 for safety/equipment       Vision Baseline Vision/History: Wears glasses Wears Glasses: At all times Patient Visual Report: Blurring of vision Vision Assessment?: Yes Eye Alignment: Within Functional Limits Ocular Range of Motion: Within Functional Limits Alignment/Gaze Preference: Gaze right Tracking/Visual Pursuits: Unable to hold  eye position out of midline(on L side) Additional Comments: continue assessment     Perception Perception Comments: L inattention   Praxis      Pertinent Vitals/Pain  Pain Assessment: No/denies pain     Hand Dominance Right   Extremity/Trunk Assessment Upper Extremity Assessment Upper Extremity Assessment: LUE deficits/detail LUE Deficits / Details: limited AROM proximally at shoulder, elbow grossly 2-/5; 0/5 distal to elbow; unable to use functionally LUE Sensation: decreased light touch;decreased proprioception LUE Coordination: decreased fine motor;decreased gross motor   Lower Extremity Assessment Lower Extremity Assessment: Defer to PT evaluation       Communication Communication Communication: Expressive difficulties(dysarthria)   Cognition Arousal/Alertness: Awake/alert Behavior During Therapy: WFL for tasks assessed/performed Overall Cognitive Status: Impaired/Different from baseline Area of Impairment: Memory;Following commands;Safety/judgement;Awareness;Problem solving;Attention                   Current Attention Level: Selective Memory: Decreased short-term memory Following Commands: Follows one step commands consistently Safety/Judgement: Decreased awareness of safety;Decreased awareness of deficits Awareness: Intellectual Problem Solving: Requires verbal cues;Requires tactile cues General Comments: Lt inattention   General Comments  reviewed support to L UE     Exercises     Shoulder Instructions      Home Living Family/patient expects to be discharged to:: Private residence Living Arrangements: Spouse/significant other Available Help at Discharge: Family Type of Home: House Home Access: Stairs to enter [employer] of Steps: 4 in front and in garage, ramp on back Entrance Stairs-Rails: Right;Left Home Layout: One level     Bathroom Shower/Tub: Occupational psychologist: Standard     Home Equipment: Environmental consultant - 2 wheels;Shower seat;Hand held shower head          Prior Functioning/Environment Level of Independence: Independent        Comments: goes to gym every morning         OT Problem List: Decreased strength;Impaired balance (sitting and/or standing);Decreased activity tolerance;Decreased range of motion;Impaired vision/perception;Decreased coordination;Decreased cognition;Decreased safety awareness;Decreased knowledge of use of DME or AE;Decreased knowledge of precautions;Impaired sensation;Impaired tone;Impaired UE functional use      OT Treatment/Interventions: Self-care/ADL training;Neuromuscular education;Modalities;Manual therapy;Therapeutic activities;Visual/perceptual remediation/compensation;Patient/family education;Balance training;Cognitive remediation/compensation    OT Goals(Current goals can be found in the care plan section) Acute Rehab OT Goals Patient Stated Goal: take shower OT Goal Formulation: With patient Time For Goal Achievement: 04/20/18 Potential to Achieve Goals: Good  OT Frequency: Min 2X/week   Barriers to D/C:            Co-evaluation PT/OT/SLP Co-Evaluation/Treatment: Yes Reason for Co-Treatment: Complexity of the patient's impairments (multi-system involvement);For patient/therapist safety;To address functional/ADL transfers PT goals addressed during session: Mobility/safety with mobility;Balance OT goals addressed during session: ADL's and self-care      AM-PAC OT "6 Clicks" Daily Activity     Outcome Measure Help from another person eating meals?: Total Help from another person taking care of personal grooming?: A Lot Help from another person toileting, which includes using toliet, bedpan, or urinal?: A Lot Help from another person bathing (including washing, rinsing, drying)?: A Lot Help from another person to put on and taking off regular upper body clothing?: A Lot Help from another person to put on and taking off regular lower body clothing?: Total 6 Click Score: 10   End of Session Equipment Utilized During Treatment: Gait belt Nurse Communication: Mobility status  Activity Tolerance: Patient tolerated  treatment well Patient left: in chair;with call bell/phone within reach;with chair alarm set  OT  Visit Diagnosis: Muscle weakness (generalized) (M62.81);Other symptoms and signs involving the nervous system (R29.898);Hemiplegia and hemiparesis;Other symptoms and signs involving cognitive function;Other abnormalities of gait and mobility (R26.89) Hemiplegia - Right/Left: Left Hemiplegia - dominant/non-dominant: Non-Dominant Hemiplegia - caused by: Cerebral infarction                Time: 0822-0855 OT Time Calculation (min): 33 min Charges:  OT General Charges $OT Visit: 1 Visit OT Evaluation $OT Eval Moderate Complexity: 1 Mod  Delight Stare, OT Acute Rehabilitation Services Pager [phone] Office [phone]   Delight Stare 04/06/2018, 9:35 AM

## 2018-04-07 ENCOUNTER — Inpatient Hospital Stay (HOSPITAL_COMMUNITY)
Admission: RE | Admit: 2018-04-07 | Discharge: 2018-04-29 | DRG: 057 | Disposition: A | Discharge status: Skilled Nursing Facility | Payer: Medicare Other | Source: Intra-hospital | Attending: Physical Medicine & Rehabilitation | Admitting: Physical Medicine & Rehabilitation

## 2018-04-07 ENCOUNTER — Encounter (HOSPITAL_COMMUNITY): Payer: Self-pay

## 2018-04-07 DIAGNOSIS — I69391 Dysphagia following cerebral infarction: Secondary | ICD-10-CM

## 2018-04-07 DIAGNOSIS — Z951 Presence of aortocoronary bypass graft: Secondary | ICD-10-CM | POA: Diagnosis not present

## 2018-04-07 DIAGNOSIS — Z9841 Cataract extraction status, right eye: Secondary | ICD-10-CM | POA: Diagnosis not present

## 2018-04-07 DIAGNOSIS — M6281 Muscle weakness (generalized): Secondary | ICD-10-CM | POA: Diagnosis not present

## 2018-04-07 DIAGNOSIS — Z8349 Family history of other endocrine, nutritional and metabolic diseases: Secondary | ICD-10-CM | POA: Diagnosis not present

## 2018-04-07 DIAGNOSIS — I69154 Hemiplegia and hemiparesis following nontraumatic intracerebral hemorrhage affecting left non-dominant side: Secondary | ICD-10-CM | POA: Diagnosis not present

## 2018-04-07 DIAGNOSIS — I959 Hypotension, unspecified: Secondary | ICD-10-CM | POA: Diagnosis not present

## 2018-04-07 DIAGNOSIS — Z8249 Family history of ischemic heart disease and other diseases of the circulatory system: Secondary | ICD-10-CM

## 2018-04-07 DIAGNOSIS — Z9861 Coronary angioplasty status: Secondary | ICD-10-CM

## 2018-04-07 DIAGNOSIS — R5381 Other malaise: Secondary | ICD-10-CM | POA: Diagnosis not present

## 2018-04-07 DIAGNOSIS — Z823 Family history of stroke: Secondary | ICD-10-CM | POA: Diagnosis not present

## 2018-04-07 DIAGNOSIS — Z811 Family history of alcohol abuse and dependence: Secondary | ICD-10-CM | POA: Diagnosis not present

## 2018-04-07 DIAGNOSIS — I130 Hypertensive heart and chronic kidney disease with heart failure and stage 1 through stage 4 chronic kidney disease, or unspecified chronic kidney disease: Secondary | ICD-10-CM | POA: Diagnosis present

## 2018-04-07 DIAGNOSIS — I69115 Cognitive social or emotional deficit following nontraumatic intracerebral hemorrhage: Secondary | ICD-10-CM | POA: Diagnosis not present

## 2018-04-07 DIAGNOSIS — I615 Nontraumatic intracerebral hemorrhage, intraventricular: Secondary | ICD-10-CM | POA: Diagnosis not present

## 2018-04-07 DIAGNOSIS — Z818 Family history of other mental and behavioral disorders: Secondary | ICD-10-CM

## 2018-04-07 DIAGNOSIS — E785 Hyperlipidemia, unspecified: Secondary | ICD-10-CM | POA: Diagnosis present

## 2018-04-07 DIAGNOSIS — B001 Herpesviral vesicular dermatitis: Secondary | ICD-10-CM | POA: Diagnosis not present

## 2018-04-07 DIAGNOSIS — I10 Essential (primary) hypertension: Secondary | ICD-10-CM

## 2018-04-07 DIAGNOSIS — I69354 Hemiplegia and hemiparesis following cerebral infarction affecting left non-dominant side: Secondary | ICD-10-CM | POA: Diagnosis not present

## 2018-04-07 DIAGNOSIS — E46 Unspecified protein-calorie malnutrition: Secondary | ICD-10-CM

## 2018-04-07 DIAGNOSIS — I5032 Chronic diastolic (congestive) heart failure: Secondary | ICD-10-CM | POA: Diagnosis present

## 2018-04-07 DIAGNOSIS — R1313 Dysphagia, pharyngeal phase: Secondary | ICD-10-CM | POA: Diagnosis present

## 2018-04-07 DIAGNOSIS — R0989 Other specified symptoms and signs involving the circulatory and respiratory systems: Secondary | ICD-10-CM | POA: Diagnosis not present

## 2018-04-07 DIAGNOSIS — C9110 Chronic lymphocytic leukemia of B-cell type not having achieved remission: Secondary | ICD-10-CM | POA: Diagnosis present

## 2018-04-07 DIAGNOSIS — Z833 Family history of diabetes mellitus: Secondary | ICD-10-CM

## 2018-04-07 DIAGNOSIS — I69122 Dysarthria following nontraumatic intracerebral hemorrhage: Secondary | ICD-10-CM | POA: Diagnosis not present

## 2018-04-07 DIAGNOSIS — I2510 Atherosclerotic heart disease of native coronary artery without angina pectoris: Secondary | ICD-10-CM | POA: Diagnosis present

## 2018-04-07 DIAGNOSIS — Z6825 Body mass index (BMI) 25.0-25.9, adult: Secondary | ICD-10-CM

## 2018-04-07 DIAGNOSIS — K219 Gastro-esophageal reflux disease without esophagitis: Secondary | ICD-10-CM | POA: Diagnosis present

## 2018-04-07 DIAGNOSIS — Z91048 Other nonmedicinal substance allergy status: Secondary | ICD-10-CM

## 2018-04-07 DIAGNOSIS — E8809 Other disorders of plasma-protein metabolism, not elsewhere classified: Secondary | ICD-10-CM

## 2018-04-07 DIAGNOSIS — Z7401 Bed confinement status: Secondary | ICD-10-CM | POA: Diagnosis not present

## 2018-04-07 DIAGNOSIS — M2550 Pain in unspecified joint: Secondary | ICD-10-CM | POA: Diagnosis not present

## 2018-04-07 DIAGNOSIS — R1312 Dysphagia, oropharyngeal phase: Secondary | ICD-10-CM

## 2018-04-07 DIAGNOSIS — E538 Deficiency of other specified B group vitamins: Secondary | ICD-10-CM | POA: Diagnosis not present

## 2018-04-07 DIAGNOSIS — I5030 Unspecified diastolic (congestive) heart failure: Secondary | ICD-10-CM | POA: Diagnosis not present

## 2018-04-07 DIAGNOSIS — I69398 Other sequelae of cerebral infarction: Secondary | ICD-10-CM | POA: Diagnosis not present

## 2018-04-07 DIAGNOSIS — I6911 Attention and concentration deficit following nontraumatic intracerebral hemorrhage: Secondary | ICD-10-CM | POA: Diagnosis not present

## 2018-04-07 DIAGNOSIS — Z9842 Cataract extraction status, left eye: Secondary | ICD-10-CM

## 2018-04-07 DIAGNOSIS — I69191 Dysphagia following nontraumatic intracerebral hemorrhage: Secondary | ICD-10-CM

## 2018-04-07 DIAGNOSIS — D696 Thrombocytopenia, unspecified: Secondary | ICD-10-CM | POA: Diagnosis present

## 2018-04-07 DIAGNOSIS — Z87442 Personal history of urinary calculi: Secondary | ICD-10-CM

## 2018-04-07 DIAGNOSIS — M109 Gout, unspecified: Secondary | ICD-10-CM | POA: Diagnosis not present

## 2018-04-07 DIAGNOSIS — I610 Nontraumatic intracerebral hemorrhage in hemisphere, subcortical: Secondary | ICD-10-CM | POA: Diagnosis not present

## 2018-04-07 DIAGNOSIS — R1310 Dysphagia, unspecified: Secondary | ICD-10-CM | POA: Diagnosis not present

## 2018-04-07 DIAGNOSIS — R58 Hemorrhage, not elsewhere classified: Secondary | ICD-10-CM | POA: Diagnosis not present

## 2018-04-07 DIAGNOSIS — Z7902 Long term (current) use of antithrombotics/antiplatelets: Secondary | ICD-10-CM

## 2018-04-07 DIAGNOSIS — N183 Chronic kidney disease, stage 3 (moderate): Secondary | ICD-10-CM | POA: Diagnosis present

## 2018-04-07 DIAGNOSIS — Z8051 Family history of malignant neoplasm of kidney: Secondary | ICD-10-CM

## 2018-04-07 DIAGNOSIS — E559 Vitamin D deficiency, unspecified: Secondary | ICD-10-CM | POA: Diagnosis not present

## 2018-04-07 DIAGNOSIS — Z888 Allergy status to other drugs, medicaments and biological substances status: Secondary | ICD-10-CM

## 2018-04-07 DIAGNOSIS — Z856 Personal history of leukemia: Secondary | ICD-10-CM | POA: Diagnosis not present

## 2018-04-07 MED ORDER — SODIUM CHLORIDE 0.9 % IV SOLN: 500.0000 mL | INTRAVENOUS | 0 refills | Status: DC

## 2018-04-07 MED ORDER — VALACYCLOVIR HCL 500 MG PO TABS
500.0000 mg | ORAL_TABLET | Freq: Every day | ORAL | Status: DC
  Administered 2018-04-08 – 2018-04-29 (×22): 500 mg via ORAL
  Filled 2018-04-07 (×22): qty 1

## 2018-04-07 MED ORDER — PANTOPRAZOLE SODIUM 40 MG IV SOLR
40.0000 mg | Freq: Every day | INTRAVENOUS | Status: DC
  Administered 2018-04-07 – 2018-04-08 (×2): 40 mg via INTRAVENOUS
  Filled 2018-04-07 (×2): qty 40

## 2018-04-07 MED ORDER — RESOURCE THICKENUP CLEAR PO POWD
ORAL | Status: DC | PRN
  Filled 2018-04-07: qty 125

## 2018-04-07 MED ORDER — ALLOPURINOL 100 MG PO TABS
300.0000 mg | ORAL_TABLET | Freq: Every day | ORAL | Status: DC
  Administered 2018-04-08 – 2018-04-29 (×22): 300 mg via ORAL
  Filled 2018-04-07 (×22): qty 3

## 2018-04-07 MED ORDER — SENNOSIDES-DOCUSATE SODIUM 8.6-50 MG PO TABS
1.0000 | ORAL_TABLET | Freq: Two times a day (BID) | ORAL | Status: DC
  Administered 2018-04-07 – 2018-04-28 (×37): 1 via ORAL
  Filled 2018-04-07 (×41): qty 1

## 2018-04-07 MED ORDER — LISINOPRIL 2.5 MG PO TABS
2.5000 mg | ORAL_TABLET | Freq: Every day | ORAL | Status: DC
  Administered 2018-04-08 – 2018-04-10 (×3): 2.5 mg via ORAL
  Filled 2018-04-07 (×3): qty 1

## 2018-04-07 MED ORDER — VITAMIN B-12 1000 MCG PO TABS
1000.0000 ug | ORAL_TABLET | Freq: Every day | ORAL | Status: DC
  Administered 2018-04-08 – 2018-04-29 (×22): 1000 ug via ORAL
  Filled 2018-04-07 (×22): qty 1

## 2018-04-07 MED ORDER — SENNOSIDES-DOCUSATE SODIUM 8.6-50 MG PO TABS: 1.0000 | ORAL_TABLET | Freq: Two times a day (BID) | ORAL | Status: DC

## 2018-04-07 MED ORDER — ACETAMINOPHEN 650 MG RE SUPP: 650.0000 mg | RECTAL | Status: DC | PRN

## 2018-04-07 MED ORDER — ACETAMINOPHEN 325 MG PO TABS
650.0000 mg | ORAL_TABLET | ORAL | Status: DC | PRN
  Administered 2018-04-25 – 2018-04-27 (×3): 650 mg via ORAL
  Filled 2018-04-07 (×4): qty 2

## 2018-04-07 MED ORDER — RESOURCE THICKENUP CLEAR PO POWD: 1.0000 | ORAL | Status: DC | PRN

## 2018-04-07 MED ORDER — ACETAMINOPHEN 160 MG/5ML PO SOLN: 650.0000 mg | ORAL | Status: DC | PRN

## 2018-04-07 MED ORDER — SORBITOL 70 % SOLN
30.0000 mL | Freq: Every day | Status: DC | PRN
  Filled 2018-04-07: qty 30

## 2018-04-07 NOTE — Progress Notes (Signed)
At approximately 2030, patient's wife called to the desk stating that the patient was coughing & was having trouble breathing. Noted the patient coughing weakly & seemed to have some congestion. Patients mouth was suctioned & he was encouraged to do deep breathing & cough. After a few tries & more suctioning, he quieted down. Patient's wife was asked not to give him anything by mouth & to ask for assistance when he wanted a drink. His table was moved out of reach. Wife stated that he fed himself & that she brought his big straw because he was having trouble with the regular straws with his nectar thick liquids. There is no order stating that he cannot have straws or that he is full supervision but the water in the cup was thin.  Awaiting evaluation tomorrow. Will continue [employer].

## 2018-04-07 NOTE — Plan of Care (Signed)
  Problem: Consults Goal: RH STROKE PATIENT EDUCATION Description See Patient Education module for education specifics, pt / fam verb understanding with min A  Outcome: Progressing   Problem: RH SKIN INTEGRITY Goal: RH STG SKIN FREE OF INFECTION/BREAKDOWN Description Pt will have no infection or breakdown on discharge with min A  Outcome: Progressing   Problem: RH SAFETY Goal: RH STG ADHERE TO SAFETY PRECAUTIONS W/ASSISTANCE/DEVICE Description STG Adhere to Safety Precautions With min Assistance/Device.  Outcome: Progressing   Problem: RH PAIN MANAGEMENT Goal: RH STG PAIN MANAGED AT OR BELOW PT'S PAIN GOAL Description Pain <2 with min A  Outcome: Progressing  Pt incontinent of b/b, no pain c/o, skin without breakdown

## 2018-04-07 NOTE — H&P (Signed)
Physical Medicine and Rehabilitation Admission H&P    Chief Complaint  Patient presents with  . Code Stroke  : HPI: Jimmy [name] is an 85 year old right-handed male with history of CAD with CABG, CLL,CKD stage III, diastolic congestive heart failure, hypertension, CVA 2 years ago maintained on Aggrenox. Per chart review patient lives with spouse. Reportedly independently goes to the gym every morning. One level home with 4 steps to entry. Presented 04/04/2018 with sudden onset of left-sided weakness and dysarthria. Blood pressure 152/75. Cranial CT scan showed an 8.8 mL acute intraparenchymal hematoma centered at the posterior right lentiform nucleus without significant regional mass effect.MRA negative.. Initially placed on Cardene drip for blood pressure control. Dysphagia #3 nectar thick liquids. Therapy evaluations completed with recommendations of physical medicine rehabilitation consult. Patient was admitted for a compress of rehabilitation program.  Review of Systems  Constitutional: Negative for chills and fever.  HENT: Negative for hearing loss.   Eyes: Negative for blurred vision and double vision.  Respiratory: Negative for cough and shortness of breath.   Cardiovascular: Positive for leg swelling. Negative for chest pain.  Gastrointestinal: Positive for constipation. Negative for nausea and vomiting.       GERD  Genitourinary: Negative for dysuria, flank pain and hematuria.  Musculoskeletal: Positive for joint pain and myalgias.  Skin: Negative for rash.  Neurological: Positive for focal weakness and weakness.  All other systems reviewed and are negative.  Past Medical History:  Diagnosis Date  . CAD (coronary artery disease)    a. 04/2001 S/P CABGx 4;  b. 2008 MV:  EF 63% normal perfusion.  . Carotid stenosis    a. 11/2012 Carotid U/S:  RICA 81-19%, LICA 14-78%.  . Chronic kidney disease, stage 3 ([place])   . CLL (chronic lymphoblastic leukemia) 2009   Stage IV; Dr.  Ivor Messier - referred to Dr. Lissa Merlin [facility] 07/2013 now on Rituxan (10/2013) --> stage 0 (09/2014)  . Diastolic CHF ([place])    a. 05/9560 Echo: EF 55-65%, mild conc LVH, Gr 1 DD, mild AS, triv AI, mildly dil Ao root (3.5 cm).  . GERD (gastroesophageal reflux disease) 1990s  . History of CVA (cerebrovascular accident) 2015   by MRI - remote L internal capsule  . History of herpes genitalis    valtrex daily  . Hyperlipemia 2002  . Hypertension 2002  . Mass of submandibular region 2015   referred to gen surg after chemo  . Mild aortic stenosis    a. 07/2011 Echo: Mild AS, triv AI.  [name] Stroke ([place])   . Thrombocytopenia ([place])    outpatient goals Hgb >9, plt >20  . TIA (transient ischemic attack)    04/2016   Past Surgical History:  Procedure Laterality Date  . ANGIOPLASTY  1998  . CATARACT EXTRACTION, BILATERAL     R 1/09, L 8/09  . COLONOSCOPY  11/29/1987   Normal  . COLONOSCOPY  02/07/2003   Hemm. Internal, focal proctitis, negative biopsy  . COLONOSCOPY  12/12/2004   Internal external hemorrhoids, +proctits, negative biopsy  . CORONARY ANGIOPLASTY  1998  . CORONARY ARTERY BYPASS GRAFT  04/23/2001   x4, Dr. Pia Mau  . ESOPHAGOGASTRODUODENOSCOPY  11/29/1987   Gastritis  . ETT  12/10/2006   Persantine myoview nml  . HEMORROIDECTOMY  1954   Fissure repair, [place]  . HEPATIC ARTERY ANGIOPLASTY  1954   [place], [place]  . KIDNEY STONE SURGERY  1977   Dr Redmond Baseman  . LITHOTRIPSY  1990s  Multiple  . US ECHOCARDIOGRAPHY  07/2011   nl sys fxn, EF 55-60%, grade 1 diast dysfunction, mild AS, mildly elevated PA pressure   Family History  Problem Relation Age of Onset  . Hypertension Mother   . Heart disease Mother   . Hyperlipidemia Mother   . Hypertension Father   . Hyperlipidemia Father   . Kidney cancer Sister        Renal cell cancer  . Alcohol abuse Brother   . Diabetes Brother   . Stroke Brother   . Heart attack Brother        MI  . Diabetes Other        Sister's daughter  .  Depression Daughter        Bipolar   Social History:  reports that he quit smoking about 39 years ago. He has a 60.00 pack-year smoking history. He has never used smokeless tobacco. He reports that he does not drink alcohol or use drugs. Allergies:  Allergies  Allergen Reactions  . New Skin Other (See Comments)    unknown  . Tape Dermatitis   Medications Prior to Admission  Medication Sig Dispense Refill  . allopurinol (ZYLOPRIM) 300 MG tablet TAKE 1 TABLET DAILY (Patient taking differently: Take 300 mg by mouth daily. ) 90 tablet 1  . cholecalciferol (VITAMIN D) 1000 units tablet Take 2,000 Units by mouth daily.    . diphenhydrAMINE (BENADRYL ALLERGY) 25 mg capsule Take 25 mg by mouth daily as needed for itching, allergies or sleep.     . dipyridamole-aspirin (AGGRENOX) 200-25 MG 12hr capsule TAKE 1 CAPSULE TWICE A DAY (Patient taking differently: Take 1 capsule by mouth 2 (two) times daily. ) 180 capsule 1  . lisinopril (PRINIVIL,ZESTRIL) 5 MG tablet TAKE 1 TABLET DAILY (NEW DOSE) (Patient taking differently: Take 5 mg by mouth daily. ) 90 tablet 3  . nitroGLYCERIN (NITROSTAT) 0.4 MG SL tablet Place 1 tablet (0.4 mg total) under the tongue every 5 (five) minutes as needed for chest pain (max 3 doses in 15 minutes). 25 tablet 3  . simvastatin (ZOCOR) 10 MG tablet TAKE 1 TABLET AT BEDTIME (Patient taking differently: Take 10 mg by mouth daily. ) 90 tablet 3  . valACYclovir (VALTREX) 500 MG tablet TAKE 1 TABLET DAILY (Patient taking differently: Take 500 mg by mouth daily as needed (outbreak). ) 90 tablet 4  . vitamin B-12 (CYANOCOBALAMIN) 1000 MCG tablet Take 1 tablet (1,000 mcg total) by mouth daily.      Drug Regimen Review Drug regimen was reviewed and remains appropriate with no significant issues identified  Home: Home Living Family/patient expects to be discharged to:: Inpatient rehab Living Arrangements: Spouse/significant other Available Help at Discharge: Family Type of Home:  House Home Access: Stairs to enter [employer] of Steps: 4 in front and in garage, ramp on back Entrance Stairs-Rails: Right, Left Home Layout: One level Bathroom Shower/Tub: Multimedia programmer: Standard Home Equipment: Environmental consultant - 2 wheels, Shower seat, Hand held shower head   Functional History: Prior Function Level of Independence: Independent Comments: goes to gym every morning  Functional Status:  Mobility: Bed Mobility Overal bed mobility: Needs Assistance Bed Mobility: Rolling, Sidelying to Sit Rolling: Min assist Sidelying to sit: Min assist Supine to sit: Mod assist General bed mobility comments: cues for sequence with assist to direct attention and neck rotation to left, reach for rail and bend RLE hooking LLE to bring legs off bed.  Transfers Overall transfer level: Needs assistance Equipment  used: 1 person hand held assist, 2 person hand held assist Transfer via Lift Equipment: Stedy Transfers: Sit to/from [employer] to Stand: Min assist, +2 safety/equipment General transfer comment: min assist to rise from bed and chair with increased time. cues for hand placement. Mod assist once standing for balance and weight shift Ambulation/Gait Ambulation/Gait assistance: Mod assist, +2 safety/equipment Gait Distance (Feet): 5 Feet Assistive device: 1 person hand held assist Gait Pattern/deviations: Step-to pattern, Narrow base of support General Gait Details: pt facing therapist with RUE on therapist arm and LUE tucked in therapist axilla pt with assist and cues to weight shift right with pt able to advance LLE with cueing. Pt with difficulty moving LLE with turning and required assist to position leg. 2 trials of grossly 5' each with chair follow and 2 person assist for lines, chair, safety and positioning Gait velocity interpretation: <1.31 ft/sec, indicative of household ambulator    ADL: ADL Overall ADL's : Needs assistance/impaired Eating/Feeding:  NPO Grooming: Moderate assistance, Wash/dry hands, Wash/dry face, Sitting Grooming Details (indicate cue type and reason): hand over hand support to wash R hand  Upper Body Bathing: Moderate assistance, Sitting Upper Body Bathing Details (indicate cue type and reason): hand over hand assist to wash R UE with L  Lower Body Bathing: +2 for physical assistance, Maximal assistance, Sit to/from stand Upper Body Dressing : Maximal assistance, Sitting Lower Body Dressing: Maximal assistance, +2 for physical assistance, Sit to/from stand Toilet Transfer: Minimal assistance, +2 for physical assistance, Ambulation Toilet Transfer Details (indicate cue type and reason): simulated to recliner  Functional mobility during ADLs: Minimal assistance, +2 for safety/equipment  Cognition: Cognition Overall Cognitive Status: Impaired/Different from baseline Orientation Level: Oriented X4 Cognition Arousal/Alertness: Awake/alert Behavior During Therapy: WFL for tasks assessed/performed Overall Cognitive Status: Impaired/Different from baseline Area of Impairment: Memory, Following commands, Safety/judgement, Awareness, Problem solving, Attention Current Attention Level: Selective Memory: Decreased short-term memory Following Commands: Follows one step commands consistently Safety/Judgement: Decreased awareness of safety, Decreased awareness of deficits Awareness: Intellectual Problem Solving: Requires verbal cues, Requires tactile cues General Comments: Lt inattention  Physical Exam: Blood pressure (!) 144/62, pulse 67, temperature 98.2 F (36.8 C), temperature source Oral, resp. rate 17, height 5\' 10"  (1.778 m), weight 75.8 kg, SpO2 97 %. Physical Exam  Constitutional: No distress.  HENT:  Head: Normocephalic and atraumatic.  Eyes: Pupils are equal, round, and reactive to light.  Neck: No tracheal deviation present. No thyromegaly present.  Cardiovascular: Normal rate and regular rhythm. Exam  reveals no friction rub.  Murmur heard. Respiratory: Effort normal. No respiratory distress. He has no wheezes. He has no rales.  GI: Soft. Bowel sounds are normal. He exhibits no distension. There is no abdominal tenderness.  Musculoskeletal: Normal range of motion.  Neurological:  Patient is alert sitting up in bed   Makes good eye contact with examiner. Left central 7.  Speech is dysarthric but intelligible. He provides his name and age, oriented to place. Fair insight and awareness. . LUE: 2+ delt and biceps, 2 triceps, 2- WE, 0 HI. LLE: 2+ HF, KE and 3/5 ADF/PF. RUE and RLE grossly 4 to 5/5. Sensory 1+/2 LUE and LLE..   Skin: Skin is warm. He is not diaphoretic.  Psychiatric: He has a normal mood and affect. His behavior is normal.    Results for orders placed or performed during the hospital encounter of 04/04/18 (from the past 48 hour(s))  CBC     Status: Abnormal   Collection Time: 04/06/18  6:12 AM  Result Value Ref Range   WBC 7.2 4.0 - 10.5 K/uL   RBC 4.28 4.22 - 5.81 MIL/uL   Hemoglobin 14.3 13.0 - 17.0 g/dL   HCT 41.3 39.0 - 52.0 %   MCV 96.5 80.0 - 100.0 fL   MCH 33.4 26.0 - 34.0 pg   MCHC 34.6 30.0 - 36.0 g/dL   RDW 12.9 11.5 - 15.5 %   Platelets 146 (L) 150 - 400 K/uL   nRBC 0.0 0.0 - 0.2 %    Comment: Performed at [facility], [place] [address] [place], [place] [address]  Basic metabolic panel     Status: Abnormal   Collection Time: 04/06/18  6:12 AM  Result Value Ref Range   Sodium 135 135 - 145 mmol/L   Potassium 3.9 3.5 - 5.1 mmol/L   Chloride 107 98 - 111 mmol/L   CO2 21 (L) 22 - 32 mmol/L   Glucose, Bld 98 70 - 99 mg/dL   BUN 22 8 - 23 mg/dL   Creatinine, Ser 1.25 (H) 0.61 - 1.24 mg/dL   Calcium 9.2 8.9 - 10.3 mg/dL   GFR calc non Af Amer 51 (L) >60 mL/min   GFR calc Af Amer 60 (L) >60 mL/min   Anion gap 7 5 - 15    Comment: Performed at [facility], [place] [address] [place], [place] [address]   Dg Swallowing Func-speech Pathology  Result  Date: 04/07/2018 Objective Swallowing Evaluation: Type of Study: MBS-Modified Barium Swallow Study  Patient Details Name: Jimmy [name] MRN: [id number] Date of Birth: 04-Jun-1928 Today's Date: 04/07/2018 Time: SLP Start Time (ACUTE ONLY): 1030 -SLP Stop Time (ACUTE ONLY): 1100 SLP Time Calculation (min) (ACUTE ONLY): 30 min Past Medical History: Past Medical History: Diagnosis Date . CAD (coronary artery disease)   a. 04/2001 S/P CABGx 4;  b. 2008 MV:  EF 63% normal perfusion. . Carotid stenosis   a. 11/2012 Carotid U/S:  RICA 10-27%, LICA 25-36%. . Chronic kidney disease, stage 3 ([place])  . CLL (chronic lymphoblastic leukemia) 2009  Stage IV; Dr. Ivor Messier - referred to Dr. Lissa Merlin [facility] 07/2013 now on Rituxan (10/2013) --> stage 0 (09/2014) . Diastolic CHF ([place])   a. 08/4401 Echo: EF 55-65%, mild conc LVH, Gr 1 DD, mild AS, triv AI, mildly dil Ao root (3.5 cm). . GERD (gastroesophageal reflux disease) 1990s . History of CVA (cerebrovascular accident) 2015  by MRI - remote L internal capsule . History of herpes genitalis   valtrex daily . Hyperlipemia 2002 . Hypertension 2002 . Mass of submandibular region 2015  referred to gen surg after chemo . Mild aortic stenosis   a. 07/2011 Echo: Mild AS, triv AI. [name] Stroke ([place])  . Thrombocytopenia ([place])   outpatient goals Hgb >9, plt >20 . TIA (transient ischemic attack)   04/2016 Past Surgical History: Past Surgical History: Procedure Laterality Date . ANGIOPLASTY  1998 . CATARACT EXTRACTION, BILATERAL    R 1/09, L 8/09 . COLONOSCOPY  11/29/1987  Normal . COLONOSCOPY  02/07/2003  Hemm. Internal, focal proctitis, negative biopsy . COLONOSCOPY  12/12/2004  Internal external hemorrhoids, +proctits, negative biopsy . CORONARY ANGIOPLASTY  1998 . CORONARY ARTERY BYPASS GRAFT  04/23/2001  x4, Dr. Pia Mau . ESOPHAGOGASTRODUODENOSCOPY  11/29/1987  Gastritis . ETT  12/10/2006  Persantine myoview nml . HEMORROIDECTOMY  1954  Fissure repair, [place] . HEPATIC ARTERY ANGIOPLASTY  1954  [place], [place]  . KIDNEY STONE SURGERY  1977  Dr Redmond Baseman .  LITHOTRIPSY  1990s  Multiple . US ECHOCARDIOGRAPHY  07/2011  nl sys fxn, EF 55-60%, grade 1 diast dysfunction, mild AS, mildly elevated PA pressure HPI: 85 year old male admitted with left sided weakness and facial droop. CT scan showed right basal ganglia hemorrage. Patient given ativan for myoclonic jerking vs seizure activity on arrival with subsequent sedation. Also with h/o CVA and TIA, GERD.  No data recorded Assessment / Plan / Recommendation CHL IP CLINICAL IMPRESSIONS 04/06/2018 Clinical Impression Pt demonstrates a primary pharyngeal dysphagia. Deficits include delayed swallow initaition and suspicion of decreased mobility of left sided arytenoid for laryngeal closure. Oral transit is adequate but nectar and thinner boluses teaspoon size and greater arrive at the pyriforms when pt initaites swallow with aspiraiton of significant severity with sensation. A 1/2 teaspoon of honey slows down bolus flow to allow for swallow trigger before it reaches pyrifroms, but trace aspiration during the swallow is still observed, not improved with chin tuck. 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 80% of trials with honey, nectar and thin were tolerated with a head turn left without aspiration. Pt was independent by the end of test. WHen pushed to alternate solids and liquids there were some aspiration events despite the head turn due to mild residuals. Pt reports he never drinks fluids while eating. He also reports he really only drinks water. Will initiate a mechanical soft diet with nectar thick liquids and a head turn left. Plan to upgrade to at least a water protocol with thin water over the next few days as pt practices head turn.  SLP Visit Diagnosis Dysphagia, pharyngeal phase (R13.13) Attention and concentration deficit following -- Frontal lobe and executive function deficit following -- Impact on safety and  function Moderate aspiration risk   CHL IP TREATMENT RECOMMENDATION 04/06/2018 Treatment Recommendations F/U MBS in --- days (Comment);Therapy as outlined in treatment plan below   Prognosis 04/04/2018 Prognosis for Safe Diet Advancement Good Barriers to Reach Goals -- Barriers/Prognosis Comment -- CHL IP DIET RECOMMENDATION 04/06/2018 SLP Diet Recommendations Nectar thick liquid;Dysphagia 3 (Mech soft) solids Liquid Administration via Cup;Straw Medication Administration Whole meds with puree Compensations Slow rate;Small sips/bites Postural Changes (No Data)   CHL IP OTHER RECOMMENDATIONS 04/06/2018 Recommended Consults -- Oral Care Recommendations Oral care BID Other Recommendations Order thickener from pharmacy   CHL IP FOLLOW UP RECOMMENDATIONS 04/06/2018 Follow up Recommendations Inpatient Rehab   CHL IP FREQUENCY AND DURATION 04/06/2018 Speech Therapy Frequency (ACUTE ONLY) min 2x/week Treatment Duration 2 weeks      CHL IP ORAL PHASE 04/06/2018 Oral Phase Impaired Oral - Pudding Teaspoon -- Oral - Pudding Cup -- Oral - Honey Teaspoon WFL Oral - Honey Cup -- Oral - Nectar Teaspoon WFL Oral - Nectar Cup WFL Oral - Nectar Straw WFL Oral - Thin Teaspoon -- Oral - Thin Cup WFL Oral - Thin Straw WFL Oral - Puree WFL Oral - Mech Soft WFL Oral - Regular -- Oral - Multi-Consistency -- Oral - Pill -- Oral Phase - Comment --  CHL IP PHARYNGEAL PHASE 04/06/2018 Pharyngeal Phase Impaired Pharyngeal- Pudding Teaspoon -- Pharyngeal -- Pharyngeal- Pudding Cup -- Pharyngeal -- Pharyngeal- Honey Teaspoon Delayed swallow initiation-vallecula;Reduced airway/laryngeal closure;Penetration/Aspiration during swallow;Trace aspiration Pharyngeal Material enters airway, passes BELOW cords without attempt by patient to eject out (silent aspiration);Material does not enter airway Pharyngeal- Honey Cup Penetration/Aspiration during swallow;Reduced airway/laryngeal closure;Trace aspiration Pharyngeal Material enters airway, passes BELOW cords without  attempt by patient to eject out (  silent aspiration) Pharyngeal- Nectar Teaspoon Penetration/Aspiration before swallow;Penetration/Aspiration during swallow;Reduced airway/laryngeal closure;Delayed swallow initiation-pyriform sinuses;Moderate aspiration Pharyngeal Material enters airway, passes BELOW cords and not ejected out despite cough attempt by patient;Material enters airway, passes BELOW cords then ejected out Pharyngeal- Nectar Cup Penetration/Aspiration before swallow;Penetration/Aspiration during swallow;Reduced airway/laryngeal closure;Delayed swallow initiation-pyriform sinuses;Moderate aspiration;Compensatory strategies attempted (with notebox) Pharyngeal Material enters airway, passes BELOW cords and not ejected out despite cough attempt by patient;Material does not enter airway Pharyngeal- Nectar Straw Penetration/Aspiration before swallow;Penetration/Aspiration during swallow;Reduced airway/laryngeal closure;Delayed swallow initiation-pyriform sinuses;Moderate aspiration Pharyngeal Material enters airway, passes BELOW cords and not ejected out despite cough attempt by patient;Material does not enter airway Pharyngeal- Thin Teaspoon -- Pharyngeal -- Pharyngeal- Thin Cup Penetration/Aspiration before swallow;Penetration/Aspiration during swallow;Reduced airway/laryngeal closure;Delayed swallow initiation-pyriform sinuses;Moderate aspiration Pharyngeal Material enters airway, passes BELOW cords and not ejected out despite cough attempt by patient;Material does not enter airway Pharyngeal- Thin Straw Penetration/Aspiration before swallow;Penetration/Aspiration during swallow;Reduced airway/laryngeal closure;Delayed swallow initiation-pyriform sinuses;Moderate aspiration Pharyngeal Material enters airway, passes BELOW cords and not ejected out despite cough attempt by patient;Material does not enter airway;Material enters airway, passes BELOW cords without attempt by patient to eject out (silent  aspiration) Pharyngeal- Puree Delayed swallow initiation-vallecula;Pharyngeal residue - valleculae Pharyngeal -- Pharyngeal- Mechanical Soft Pharyngeal residue - valleculae;Delayed swallow initiation-vallecula Pharyngeal -- Pharyngeal- Regular -- Pharyngeal -- Pharyngeal- Multi-consistency -- Pharyngeal -- Pharyngeal- Pill -- Pharyngeal -- Pharyngeal Comment --  No flowsheet data found. Herbie Baltimore, MA CCC-SLP Acute Rehabilitation Services Pager [phone] Office [phone] Lynann [name] 04/07/2018, 10:11 AM                  Medical Problem List and Plan: 1.  Left side weakness with dysarthria and dysphagia secondary to acute right basal ganglia hemorrhage secondary to hypertensive crisis  -admit to inpatient rehab.  2. DVT Prophylaxis/Anticoagulation: SCDs 3. Pain Management:  Tylenol as needed 4. Mood:  Provide emotional support 5. Neuropsych: This patient is capable of making decisions on his own behalf. 6. Skin/Wound Care:  Routine skin checks 7. Fluids/Electrolytes/Nutrition:  Routine in and out's with follow-up chemistries upon admit 8. Dysphagia. Dysphagia #3 neck thick liquids. Follow-up speech therapy 9. Hypertension. Lisinopril 2.5 mg daily. Monitor with increased mobility 10. History of gout. Allopurinol 300 mg daily. 11. History of CAD with CABG. No chest pain or shortness of breath 12. CKD stage III. Baseline creatinine 1.40  Post Admission Physician Evaluation: 1. Functional deficits secondary  to right basal ganglia. 2. Patient is admitted to receive collaborative, interdisciplinary care between the physiatrist, rehab nursing staff, and therapy team. 3. Patient's level of medical complexity and substantial therapy needs in context of that medical necessity cannot be provided at a lesser intensity of care such as a SNF. 4. Patient has experienced substantial functional loss from his/her baseline which was documented above under the "Functional History" and  "Functional Status" headings.  Judging by the patient's diagnosis, physical exam, and functional history, the patient has potential for functional progress which will result in measurable gains while on inpatient rehab.  These gains will be of substantial and practical use upon discharge  in facilitating mobility and self-care at the household level. 5. Physiatrist will provide 24 hour management of medical needs as well as oversight of the therapy plan/treatment and provide guidance as appropriate regarding the interaction of the two. 6. The Preadmission Screening has been reviewed and patient status is unchanged unless otherwise stated above. 7. 24 hour rehab nursing will assist with bladder management, bowel management, safety, skin/wound  care, disease management, medication administration, pain management and patient education  and help integrate therapy concepts, techniques,education, etc. 8. PT will assess and treat for/with: Lower extremity strength, range of motion, stamina, balance, functional mobility, safety, adaptive techniques and equipment, NMR, family education, visual-spatial awareness.   Goals are: supervision. 9. OT will assess and treat for/with: ADL's, functional mobility, safety, upper extremity strength, adaptive techniques and equipment, NMR, family education, community reentry, ego support.   Goals are: supervision. Therapy may proceed with showering this patient. 10. SLP will assess and treat for/with: cognition, communication, swallowing, family ed.  Goals are: supervision. 11. Case Management and Social Worker will assess and treat for psychological issues and discharge planning. 12. Team conference will be held weekly to assess progress toward goals and to determine barriers to discharge. 13. Patient will receive at least 3 hours of therapy per day at least 5 days per week. 14. ELOS: 14-18 days       15. Prognosis:  excellent   I have personally performed a face to face  diagnostic evaluation of this patient and formulated the key components of the plan.  Additionally, I have personally reviewed laboratory data, imaging studies, as well as relevant notes and concur with the physician assistant's documentation above.  Meredith Staggers, MD, Mellody Drown    Lavon Paganini [place], PA-C 04/07/2018

## 2018-04-07 NOTE — Consult Note (Signed)
            [facility] CM Primary Care Navigator  04/07/2018  Hosam Mcfetridge [facility] 01/23/1929 [id number]   Seenpatient at the bedside toidentify possible discharge needs.  Patient reportshaving sudden "left-sided weakness" which resultedto thisadmission.([place]- intracerebral hemorrhage, acute hematoma/ hemorrhagic infarction in the right basal ganglia- hypertensive, dysphagia secondary to stroke)  Patientconfirms Dr. Ria Bush with [facility] [place] primary care provider.   Patientreports using[facility], research (physical sciences) on [address] [place] to obtain medications without difficulty.  Patientstates that he has beenmanaging hismedications at homestraight out of the containers with wife's assistance.  Patientreports that he has been driving prior to admission but wife [name]) and friends Patrick [place] and [place]) will be able to provide transportation to his doctors' appointments after discharge.  He states that he lives with wife who will be his primary caregiver at home.  [facility].  Patientvoiced understandingto call primaryprovider's office when hereturnsback home,for a post discharge follow-up visitwithin1- 2 weeksor sooner if needs arise.Patient letter (with PCP's contact number) was provided asareminder.   Discussed with patientabout[facility] CM services available for health management andresourcesat homeand he seemed interested with it.  Patientexpressedunderstanding todiscuss with primary care provider onhisnext visit,aboutfurther needsand assistance in managing hishealthneeds when hegetsback to home. Patientvoicedunderstandingto seekreferral from primary care provider to [facility] care management asdeemed necessary and appropriatefor anyservicesin the nearfuture-oncehe returnshome.  [facility] care management  information was provided for future needs that hemay have.  Primary care provider's office is listed as providing transition of care (TOC) follow-up.  Noted EMMI stroke calls in place to follow-up patient's recovery and he was made aware of it.   For additional questions please contact:  Edwena Felty A. Nattalie Santiesteban, BSN, RN-BC [facility] PRIMARY CARE Navigator Cell: [phone]

## 2018-04-07 NOTE — Evaluation (Signed)
Speech Language Pathology Evaluation Patient Details Name: Jimmy [name] MRN: [id number] DOB: 09-24-1928 Today's Date: 04/07/2018 Time: 1410-1430 SLP Time Calculation (min) (ACUTE ONLY): 20 min  Problem List:  Patient Active Problem List   Diagnosis Date Noted  . Dysphagia 04/07/2018  . Family history of stroke 04/07/2018  . ICH (intracerebral hemorrhage) ([place]) 04/04/2018  . MCI (mild cognitive impairment) with memory loss 06/17/2017  . Dizziness 06/17/2017  . Diarrhea 10/18/2016  . TIA (transient ischemic attack) 04/08/2016  . Thrombocytopenia ([place]) 04/08/2016  . Leg weakness 11/11/2014  . Advanced care planning/counseling discussion 09/16/2014  . Nocturia 09/16/2014  . Mass of submandibular region   . History of CVA (cerebrovascular accident)   . Chest pressure 11/19/2013  . S/P CABG (coronary artery bypass graft) 11/19/2013  . Chronic kidney disease, stage 3 ([place])   . Aortic stenosis   . Medicare annual wellness visit, subsequent 08/27/2012  . Vitamin D deficiency 08/17/2012  . Dyspnea 06/26/2011  . Erectile dysfunction 09/18/2010  . Carotid artery stenosis 12/01/2009  . UNSPECIFIED SUBJECTIVE VISUAL DISTURBANCE 05/01/2009  . BACK PAIN, LUMBAR 10/24/2008  . Chronic lymphocytic leukemia, Rai stage 0 ([place]) 04/02/2007    Class: Chronic  . DERMATITIS, SEBORRHEIC NOS 12/16/2006  . GENITAL HERPES 12/04/2006  . Mixed hyperlipidemia 12/04/2006  . Essential hypertension 12/04/2006  . Coronary atherosclerosis 12/04/2006  . GERD 12/04/2006  . Prediabetes 12/04/2006  . RENAL CALCULUS, HX OF 12/04/2006   Past Medical History:  Past Medical History:  Diagnosis Date  . CAD (coronary artery disease)    a. 04/2001 S/P CABGx 4;  b. 2008 MV:  EF 63% normal perfusion.  . Carotid stenosis    a. 11/2012 Carotid U/S:  RICA 37-85%, LICA 88-50%.  . Chronic kidney disease, stage 3 ([place])   . CLL (chronic lymphoblastic leukemia) 2009   Stage IV; Dr. Ivor Messier - referred to Dr. Lissa Merlin  [facility] 07/2013 now on Rituxan (10/2013) --> stage 0 (09/2014)  . Diastolic CHF ([place])    a. 05/7739 Echo: EF 55-65%, mild conc LVH, Gr 1 DD, mild AS, triv AI, mildly dil Ao root (3.5 cm).  . GERD (gastroesophageal reflux disease) 1990s  . History of CVA (cerebrovascular accident) 2015   by MRI - remote L internal capsule  . History of herpes genitalis    valtrex daily  . Hyperlipemia 2002  . Hypertension 2002  . Mass of submandibular region 2015   referred to gen surg after chemo  . Mild aortic stenosis    a. 07/2011 Echo: Mild AS, triv AI.  [name] Stroke ([place])   . Thrombocytopenia ([place])    outpatient goals Hgb >9, plt >20  . TIA (transient ischemic attack)    04/2016   Past Surgical History:  Past Surgical History:  Procedure Laterality Date  . ANGIOPLASTY  1998  . CATARACT EXTRACTION, BILATERAL     R 1/09, L 8/09  . COLONOSCOPY  11/29/1987   Normal  . COLONOSCOPY  02/07/2003   Hemm. Internal, focal proctitis, negative biopsy  . COLONOSCOPY  12/12/2004   Internal external hemorrhoids, +proctits, negative biopsy  . CORONARY ANGIOPLASTY  1998  . CORONARY ARTERY BYPASS GRAFT  04/23/2001   x4, Dr. Pia Mau  . ESOPHAGOGASTRODUODENOSCOPY  11/29/1987   Gastritis  . ETT  12/10/2006   Persantine myoview nml  . HEMORROIDECTOMY  1954   Fissure repair, [place]  . HEPATIC ARTERY ANGIOPLASTY  1954   [place], [place]  . KIDNEY STONE SURGERY  1977   Dr Redmond Baseman  .  LITHOTRIPSY  1990s   Multiple  . US ECHOCARDIOGRAPHY  07/2011   nl sys fxn, EF 55-60%, grade 1 diast dysfunction, mild AS, mildly elevated PA pressure   HPI:  85 year old male admitted with left sided weakness and facial droop. CT scan showed right basal ganglia hemorrage. Patient given ativan for myoclonic jerking vs seizure activity on arrival with subsequent sedation. Also with h/o CVA and TIA, GERD.    Assessment / Plan / Recommendation Clinical Impression   Pt presents with moderate dysarthria resulting from left sided oral motor  weakness which impacts articulatory precision and leads to decreased speech intelligibility at the conversational level.  In a quiet environment, pt was relatively intelligible but he reports that people often ask him to repeat himself in noisy or distracting environments.  Pt needed min assist for recall of 3 out of 4 words on delayed recall subtest but functional problem solving on clock drawing task were [facility].  Would recommend further cognitive assessment at next venue of care. Pt awaiting to discharge to CIR this afternoon.      SLP Assessment  SLP Visit Diagnosis: Cognitive communication deficit (R41.841);Dysarthria and anarthria (R47.1)    Follow Up Recommendations  Inpatient Rehab    Frequency and Duration min 2x/week         SLP Evaluation Cognition  Overall Cognitive Status: Impaired/Different from baseline Arousal/Alertness: Awake/alert Orientation Level: Oriented X4 Attention: Sustained Sustained Attention: Appears intact Memory: Impaired Memory Impairment: Retrieval deficit       Comprehension  Auditory Comprehension Overall Auditory Comprehension: Appears within functional limits for tasks assessed    Expression Verbal Expression Overall Verbal Expression: Appears within functional limits for tasks assessed Written Expression Dominant Hand: Right   Oral / Motor  Oral Motor/Sensory Function Overall Oral Motor/Sensory Function: Moderate impairment Facial ROM: Reduced left;Suspected CN VII (facial) dysfunction Facial Symmetry: Abnormal symmetry left;Suspected CN VII (facial) dysfunction Facial Strength: Reduced left;Suspected CN VII (facial) dysfunction Facial Sensation: Within Functional Limits Lingual ROM: Reduced left;Suspected CN XII (hypoglossal) dysfunction Lingual Symmetry: Within Functional Limits Lingual Strength: Reduced Lingual Sensation: Within Functional Limits Velum: Within Functional Limits Mandible: Within Functional Limits Motor Speech Overall  Motor Speech: Impaired Respiration: Within functional limits Phonation: Normal Resonance: Within functional limits Articulation: Impaired Level of Impairment: Sentence Intelligibility: Intelligibility reduced Conversation: 75-100% accurate Motor Planning: Witnin functional limits   GO                    Alphia Behanna, Selinda Orion 04/07/2018, 2:45 PM

## 2018-04-07 NOTE — H&P (Signed)
Physical Medicine and Rehabilitation Admission H&P        Chief Complaint  Patient presents with  . Code Stroke  : HPI: Jimmy [name] is an 85 year old right-handed male with history of CAD with CABG, CLL,CKD stage III, diastolic congestive heart failure, hypertension, CVA 2 years ago maintained on Aggrenox. Per chart review patient lives with spouse. Reportedly independently goes to the gym every morning. One level home with 4 steps to entry. Presented 04/04/2018 with sudden onset of left-sided weakness and dysarthria. Blood pressure 152/75. Cranial CT scan showed an 8.8 mL acute intraparenchymal hematoma centered at the posterior right lentiform nucleus without significant regional mass effect.MRA negative.. Initially placed on Cardene drip for blood pressure control. Dysphagia #3 nectar thick liquids. Therapy evaluations completed with recommendations of physical medicine rehabilitation consult. Patient was admitted for a compress of rehabilitation program.   Review of Systems  Constitutional: Negative for chills and fever.  HENT: Negative for hearing loss.   Eyes: Negative for blurred vision and double vision.  Respiratory: Negative for cough and shortness of breath.   Cardiovascular: Positive for leg swelling. Negative for chest pain.  Gastrointestinal: Positive for constipation. Negative for nausea and vomiting.       GERD  Genitourinary: Negative for dysuria, flank pain and hematuria.  Musculoskeletal: Positive for joint pain and myalgias.  Skin: Negative for rash.  Neurological: Positive for focal weakness and weakness.  All other systems reviewed and are negative.       Past Medical History:  Diagnosis Date  . CAD (coronary artery disease)      a. 04/2001 S/P CABGx 4;  b. 2008 MV:  EF 63% normal perfusion.  . Carotid stenosis      a. 11/2012 Carotid U/S:  RICA 83-66%, LICA 29-47%.  . Chronic kidney disease, stage 3 ([place])    . CLL (chronic lymphoblastic leukemia) 2009     Stage IV; Dr. Ivor Messier - referred to Dr. Lissa Merlin [facility] 07/2013 now on Rituxan (10/2013) --> stage 0 (09/2014)  . Diastolic CHF ([place])      a. 07/2011 Echo: EF 55-65%, mild conc LVH, Gr 1 DD, mild AS, triv AI, mildly dil Ao root (3.5 cm).  . GERD (gastroesophageal reflux disease) 1990s  . History of CVA (cerebrovascular accident) 2015    by MRI - remote L internal capsule  . History of herpes genitalis      valtrex daily  . Hyperlipemia 2002  . Hypertension 2002  . Mass of submandibular region 2015    referred to gen surg after chemo  . Mild aortic stenosis      a. 07/2011 Echo: Mild AS, triv AI.  [name]    . Thrombocytopenia ([place])      outpatient goals Hgb >9, plt >20  . TIA (transient ischemic attack)      04/2016         Past Surgical History:  Procedure Laterality Date  . ANGIOPLASTY   1998  . CATARACT EXTRACTION, BILATERAL        R 1/09, L 8/09  . COLONOSCOPY   11/29/1987    Normal  . COLONOSCOPY   02/07/2003    Hemm. Internal, focal proctitis, negative biopsy  . COLONOSCOPY   12/12/2004    Internal external hemorrhoids, +proctits, negative biopsy  . CORONARY ANGIOPLASTY   1998  . CORONARY ARTERY BYPASS GRAFT   04/23/2001    x4, Dr. Pia Mau  . ESOPHAGOGASTRODUODENOSCOPY   11/29/1987  Gastritis  . ETT   12/10/2006    Persantine myoview nml  . HEMORROIDECTOMY   1954    Fissure repair, [place]  . HEPATIC ARTERY ANGIOPLASTY   1954    [place], [place]  . KIDNEY STONE SURGERY   1977    Dr Redmond Baseman  . LITHOTRIPSY   1990s    Multiple  . US ECHOCARDIOGRAPHY   07/2011    nl sys fxn, EF 55-60%, grade 1 diast dysfunction, mild AS, mildly elevated PA pressure         Family History  Problem Relation Age of Onset  . Hypertension Mother    . Heart disease Mother    . Hyperlipidemia Mother    . Hypertension Father    . Hyperlipidemia Father    . Kidney cancer Sister          Renal cell cancer  . Alcohol abuse Brother    . Diabetes Brother    . Stroke Brother    .  Heart attack Brother          MI  . Diabetes Other          Sister's daughter  . Depression Daughter          Bipolar    Social History:  reports that he quit smoking about 39 years ago. He has a 60.00 pack-year smoking history. He has never used smokeless tobacco. He reports that he does not drink alcohol or use drugs. Allergies:       Allergies  Allergen Reactions  . New Skin Other (See Comments)      unknown  . Tape Dermatitis          Medications Prior to Admission  Medication Sig Dispense Refill  . allopurinol (ZYLOPRIM) 300 MG tablet TAKE 1 TABLET DAILY (Patient taking differently: Take 300 mg by mouth daily. ) 90 tablet 1  . cholecalciferol (VITAMIN D) 1000 units tablet Take 2,000 Units by mouth daily.      . diphenhydrAMINE (BENADRYL ALLERGY) 25 mg capsule Take 25 mg by mouth daily as needed for itching, allergies or sleep.       . dipyridamole-aspirin (AGGRENOX) 200-25 MG 12hr capsule TAKE 1 CAPSULE TWICE A DAY (Patient taking differently: Take 1 capsule by mouth 2 (two) times daily. ) 180 capsule 1  . lisinopril (PRINIVIL,ZESTRIL) 5 MG tablet TAKE 1 TABLET DAILY (NEW DOSE) (Patient taking differently: Take 5 mg by mouth daily. ) 90 tablet 3  . nitroGLYCERIN (NITROSTAT) 0.4 MG SL tablet Place 1 tablet (0.4 mg total) under the tongue every 5 (five) minutes as needed for chest pain (max 3 doses in 15 minutes). 25 tablet 3  . simvastatin (ZOCOR) 10 MG tablet TAKE 1 TABLET AT BEDTIME (Patient taking differently: Take 10 mg by mouth daily. ) 90 tablet 3  . valACYclovir (VALTREX) 500 MG tablet TAKE 1 TABLET DAILY (Patient taking differently: Take 500 mg by mouth daily as needed (outbreak). ) 90 tablet 4  . vitamin B-12 (CYANOCOBALAMIN) 1000 MCG tablet Take 1 tablet (1,000 mcg total) by mouth daily.          Drug Regimen Review Drug regimen was reviewed and remains appropriate with no significant issues identified   Home: Home Living Family/patient expects to be discharged to::  Inpatient rehab Living Arrangements: Spouse/significant other Available Help at Discharge: Family Type of Home: House Home Access: Stairs to enter [employer] of Steps: 4 in front and in garage, ramp on back Entrance Stairs-Rails: Right,  Left Home Layout: One level Bathroom Shower/Tub: Multimedia programmer: Standard Home Equipment: Environmental consultant - 2 wheels, Shower seat, Hand held shower head   Functional History: Prior Function Level of Independence: Independent Comments: goes to gym every morning   Functional Status:  Mobility: Bed Mobility Overal bed mobility: Needs Assistance Bed Mobility: Rolling, Sidelying to Sit Rolling: Min assist Sidelying to sit: Min assist Supine to sit: Mod assist General bed mobility comments: cues for sequence with assist to direct attention and neck rotation to left, reach for rail and bend RLE hooking LLE to bring legs off bed.  Transfers Overall transfer level: Needs assistance Equipment used: 1 person hand held assist, 2 person hand held assist Transfer via [place]: [place] Transfers: Sit to/from [employer] to Stand: Min assist, +2 safety/equipment General transfer comment: min assist to rise from bed and chair with increased time. cues for hand placement. Mod assist once standing for balance and weight shift Ambulation/Gait Ambulation/Gait assistance: Mod assist, +2 safety/equipment Gait Distance (Feet): 5 Feet Assistive device: 1 person hand held assist Gait Pattern/deviations: Step-to pattern, Narrow base of support General Gait Details: pt facing therapist with RUE on therapist arm and LUE tucked in therapist axilla pt with assist and cues to weight shift right with pt able to advance LLE with cueing. Pt with difficulty moving LLE with turning and required assist to position leg. 2 trials of grossly 5' each with chair follow and 2 person assist for lines, chair, safety and positioning Gait velocity interpretation: <1.31  ft/sec, indicative of household ambulator   ADL: ADL Overall ADL's : Needs assistance/impaired Eating/Feeding: NPO Grooming: Moderate assistance, Wash/dry hands, Wash/dry face, Sitting Grooming Details (indicate cue type and reason): hand over hand support to wash R hand  Upper Body Bathing: Moderate assistance, Sitting Upper Body Bathing Details (indicate cue type and reason): hand over hand assist to wash R UE with L  Lower Body Bathing: +2 for physical assistance, Maximal assistance, Sit to/from stand Upper Body Dressing : Maximal assistance, Sitting Lower Body Dressing: Maximal assistance, +2 for physical assistance, Sit to/from stand Toilet Transfer: Minimal assistance, +2 for physical assistance, Ambulation Toilet Transfer Details (indicate cue type and reason): simulated to recliner  Functional mobility during ADLs: Minimal assistance, +2 for safety/equipment   Cognition: Cognition Overall Cognitive Status: Impaired/Different from baseline Orientation Level: Oriented X4 Cognition Arousal/Alertness: Awake/alert Behavior During Therapy: WFL for tasks assessed/performed Overall Cognitive Status: Impaired/Different from baseline Area of Impairment: Memory, Following commands, Safety/judgement, Awareness, Problem solving, Attention Current Attention Level: Selective Memory: Decreased short-term memory Following Commands: Follows one step commands consistently Safety/Judgement: Decreased awareness of safety, Decreased awareness of deficits Awareness: Intellectual Problem Solving: Requires verbal cues, Requires tactile cues General Comments: Lt inattention   Physical Exam: Blood pressure (!) 144/62, pulse 67, temperature 98.2 F (36.8 C), temperature source Oral, resp. rate 17, height 5\' 10"  (1.778 m), weight 75.8 kg, SpO2 97 %. Physical Exam  Constitutional: No distress.  HENT:  Head: Normocephalic and atraumatic.  Eyes: Pupils are equal, round, and reactive to light.    Neck: No tracheal deviation present. No thyromegaly present.  Cardiovascular: Normal rate and regular rhythm. Exam reveals no friction rub.  Murmur heard. Respiratory: Effort normal. No respiratory distress. He has no wheezes. He has no rales.  GI: Soft. Bowel sounds are normal. He exhibits no distension. There is no abdominal tenderness.  Musculoskeletal: Normal range of motion.  Neurological:  Patient is alert sitting up in bed   Makes good eye  contact with examiner. Left central 7.  Speech is dysarthric but intelligible. He provides his name and age, oriented to place. Fair insight and awareness. . LUE: 2+ delt and biceps, 2 triceps, 2- WE, 0 HI. LLE: 2+ HF, KE and 3/5 ADF/PF. RUE and RLE grossly 4 to 5/5. Sensory 1+/2 LUE and LLE..   Skin: Skin is warm. He is not diaphoretic.  Psychiatric: He has a normal mood and affect. His behavior is normal.      Lab Results Last 48 Hours        Results for orders placed or performed during the hospital encounter of 04/04/18 (from the past 48 hour(s))  CBC     Status: Abnormal    Collection Time: 04/06/18  6:12 AM  Result Value Ref Range    WBC 7.2 4.0 - 10.5 K/uL    RBC 4.28 4.22 - 5.81 MIL/uL    Hemoglobin 14.3 13.0 - 17.0 g/dL    HCT 41.3 39.0 - 52.0 %    MCV 96.5 80.0 - 100.0 fL    MCH 33.4 26.0 - 34.0 pg    MCHC 34.6 30.0 - 36.0 g/dL    RDW 12.9 11.5 - 15.5 %    Platelets 146 (L) 150 - 400 K/uL    nRBC 0.0 0.0 - 0.2 %      Comment: Performed at [facility], [place] [address], [place], [place] [address]  Basic metabolic panel     Status: Abnormal    Collection Time: 04/06/18  6:12 AM  Result Value Ref Range    Sodium 135 135 - 145 mmol/L    Potassium 3.9 3.5 - 5.1 mmol/L    Chloride 107 98 - 111 mmol/L    CO2 21 (L) 22 - 32 mmol/L    Glucose, Bld 98 70 - 99 mg/dL    BUN 22 8 - 23 mg/dL    Creatinine, Ser 1.25 (H) 0.61 - 1.24 mg/dL    Calcium 9.2 8.9 - 10.3 mg/dL    GFR calc non Af Amer 51 (L) >60 mL/min    GFR calc Af Amer  60 (L) >60 mL/min    Anion gap 7 5 - 15      Comment: Performed at [facility], [place] [address] [place], [place] [address]       Imaging Results (Last 48 hours)  Dg Swallowing Func-speech Pathology   Result Date: 04/07/2018 Objective Swallowing Evaluation: Type of Study: MBS-Modified Barium Swallow Study  Patient Details Name: TUDOR CHANDLEY MRN: [id number] Date of Birth: Nov 10, 1928 Today's Date: 04/07/2018 Time: SLP Start Time (ACUTE ONLY): 1030 -SLP Stop Time (ACUTE ONLY): 1100 SLP Time Calculation (min) (ACUTE ONLY): 30 min Past Medical History: Past Medical History: Diagnosis Date . CAD (coronary artery disease)   a. 04/2001 S/P CABGx 4;  b. 2008 MV:  EF 63% normal perfusion. . Carotid stenosis   a. 11/2012 Carotid U/S:  RICA 46-56%, LICA 81-27%. . Chronic kidney disease, stage 3 ([place])  . CLL (chronic lymphoblastic leukemia) 2009  Stage IV; Dr. Ivor Messier - referred to Dr. Lissa Merlin [facility] 07/2013 now on Rituxan (10/2013) --> stage 0 (09/2014) . Diastolic CHF ([place])   a. 07/1698 Echo: EF 55-65%, mild conc LVH, Gr 1 DD, mild AS, triv AI, mildly dil Ao root (3.5 cm). . GERD (gastroesophageal reflux disease) 1990s . History of CVA (cerebrovascular accident) 2015  by MRI - remote L internal capsule . History of herpes genitalis   valtrex daily .  Hyperlipemia 2002 . Hypertension 2002 . Mass of submandibular region 2015  referred to gen surg after chemo . Mild aortic stenosis   a. 07/2011 Echo: Mild AS, triv AI. [name] Stroke ([place])  . Thrombocytopenia ([place])   outpatient goals Hgb >9, plt >20 . TIA (transient ischemic attack)   04/2016 Past Surgical History: Past Surgical History: Procedure Laterality Date . ANGIOPLASTY  1998 . CATARACT EXTRACTION, BILATERAL    R 1/09, L 8/09 . COLONOSCOPY  11/29/1987  Normal . COLONOSCOPY  02/07/2003  Hemm. Internal, focal proctitis, negative biopsy . COLONOSCOPY  12/12/2004  Internal external hemorrhoids, +proctits, negative biopsy . CORONARY ANGIOPLASTY  1998 . CORONARY ARTERY BYPASS  GRAFT  04/23/2001  x4, Dr. Pia Mau . ESOPHAGOGASTRODUODENOSCOPY  11/29/1987  Gastritis . ETT  12/10/2006  Persantine myoview nml . HEMORROIDECTOMY  1954  Fissure repair, [place] . HEPATIC ARTERY ANGIOPLASTY  1954  [place], [place] . KIDNEY STONE SURGERY  1977  Dr Redmond Baseman . LITHOTRIPSY  1990s  Multiple . US ECHOCARDIOGRAPHY  07/2011  nl sys fxn, EF 55-60%, grade 1 diast dysfunction, mild AS, mildly elevated PA pressure HPI: 85 year old male admitted with left sided weakness and facial droop. CT scan showed right basal ganglia hemorrage. Patient given ativan for myoclonic jerking vs seizure activity on arrival with subsequent sedation. Also with h/o CVA and TIA, GERD.  No data recorded Assessment / Plan / Recommendation CHL IP CLINICAL IMPRESSIONS 04/06/2018 Clinical Impression Pt demonstrates a primary pharyngeal dysphagia. Deficits include delayed swallow initaition and suspicion of decreased mobility of left sided arytenoid for laryngeal closure. Oral transit is adequate but nectar and thinner boluses teaspoon size and greater arrive at the pyriforms when pt initaites swallow with aspiraiton of significant severity with sensation. A 1/2 teaspoon of honey slows down bolus flow to allow for swallow trigger before it reaches pyrifroms, but trace aspiration during the swallow is still observed, not improved with chin tuck. 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 80% of trials with honey, nectar and thin were tolerated with a head turn left without aspiration. Pt was independent by the end of test. WHen pushed to alternate solids and liquids there were some aspiration events despite the head turn due to mild residuals. Pt reports he never drinks fluids while eating. He also reports he really only drinks water. Will initiate a mechanical soft diet with nectar thick liquids and a head turn left. Plan to upgrade to at least a water protocol with thin water over the  next few days as pt practices head turn.  SLP Visit Diagnosis Dysphagia, pharyngeal phase (R13.13) Attention and concentration deficit following -- Frontal lobe and executive function deficit following -- Impact on safety and function Moderate aspiration risk   CHL IP TREATMENT RECOMMENDATION 04/06/2018 Treatment Recommendations F/U MBS in --- days (Comment);Therapy as outlined in treatment plan below   Prognosis 04/04/2018 Prognosis for Safe Diet Advancement Good Barriers to Reach Goals -- Barriers/Prognosis Comment -- CHL IP DIET RECOMMENDATION 04/06/2018 SLP Diet Recommendations Nectar thick liquid;Dysphagia 3 (Mech soft) solids Liquid Administration via Cup;Straw Medication Administration Whole meds with puree Compensations Slow rate;Small sips/bites Postural Changes (No Data)   CHL IP OTHER RECOMMENDATIONS 04/06/2018 Recommended Consults -- Oral Care Recommendations Oral care BID Other Recommendations Order thickener from pharmacy   CHL IP FOLLOW UP RECOMMENDATIONS 04/06/2018 Follow up Recommendations Inpatient Rehab   CHL IP FREQUENCY AND DURATION 04/06/2018 Speech Therapy Frequency (ACUTE ONLY) min 2x/week Treatment Duration 2 weeks  CHL IP ORAL PHASE 04/06/2018 Oral Phase Impaired Oral - Pudding Teaspoon -- Oral - Pudding Cup -- Oral - Honey Teaspoon WFL Oral - Honey Cup -- Oral - Nectar Teaspoon WFL Oral - Nectar Cup WFL Oral - Nectar Straw WFL Oral - Thin Teaspoon -- Oral - Thin Cup WFL Oral - Thin Straw WFL Oral - Puree WFL Oral - Mech Soft WFL Oral - Regular -- Oral - Multi-Consistency -- Oral - Pill -- Oral Phase - Comment --  CHL IP PHARYNGEAL PHASE 04/06/2018 Pharyngeal Phase Impaired Pharyngeal- Pudding Teaspoon -- Pharyngeal -- Pharyngeal- Pudding Cup -- Pharyngeal -- Pharyngeal- Honey Teaspoon Delayed swallow initiation-vallecula;Reduced airway/laryngeal closure;Penetration/Aspiration during swallow;Trace aspiration Pharyngeal Material enters airway, passes BELOW cords without attempt by patient to eject out  (silent aspiration);Material does not enter airway Pharyngeal- Honey Cup Penetration/Aspiration during swallow;Reduced airway/laryngeal closure;Trace aspiration Pharyngeal Material enters airway, passes BELOW cords without attempt by patient to eject out (silent aspiration) Pharyngeal- Nectar Teaspoon Penetration/Aspiration before swallow;Penetration/Aspiration during swallow;Reduced airway/laryngeal closure;Delayed swallow initiation-pyriform sinuses;Moderate aspiration Pharyngeal Material enters airway, passes BELOW cords and not ejected out despite cough attempt by patient;Material enters airway, passes BELOW cords then ejected out Pharyngeal- Nectar Cup Penetration/Aspiration before swallow;Penetration/Aspiration during swallow;Reduced airway/laryngeal closure;Delayed swallow initiation-pyriform sinuses;Moderate aspiration;Compensatory strategies attempted (with notebox) Pharyngeal Material enters airway, passes BELOW cords and not ejected out despite cough attempt by patient;Material does not enter airway Pharyngeal- Nectar Straw Penetration/Aspiration before swallow;Penetration/Aspiration during swallow;Reduced airway/laryngeal closure;Delayed swallow initiation-pyriform sinuses;Moderate aspiration Pharyngeal Material enters airway, passes BELOW cords and not ejected out despite cough attempt by patient;Material does not enter airway Pharyngeal- Thin Teaspoon -- Pharyngeal -- Pharyngeal- Thin Cup Penetration/Aspiration before swallow;Penetration/Aspiration during swallow;Reduced airway/laryngeal closure;Delayed swallow initiation-pyriform sinuses;Moderate aspiration Pharyngeal Material enters airway, passes BELOW cords and not ejected out despite cough attempt by patient;Material does not enter airway Pharyngeal- Thin Straw Penetration/Aspiration before swallow;Penetration/Aspiration during swallow;Reduced airway/laryngeal closure;Delayed swallow initiation-pyriform sinuses;Moderate aspiration Pharyngeal  Material enters airway, passes BELOW cords and not ejected out despite cough attempt by patient;Material does not enter airway;Material enters airway, passes BELOW cords without attempt by patient to eject out (silent aspiration) Pharyngeal- Puree Delayed swallow initiation-vallecula;Pharyngeal residue - valleculae Pharyngeal -- Pharyngeal- Mechanical Soft Pharyngeal residue - valleculae;Delayed swallow initiation-vallecula Pharyngeal -- Pharyngeal- Regular -- Pharyngeal -- Pharyngeal- Multi-consistency -- Pharyngeal -- Pharyngeal- Pill -- Pharyngeal -- Pharyngeal Comment --  No flowsheet data found. Herbie [place], MA CCC-SLP Acute Rehabilitation Services Pager [phone] Office [phone] Lynann [name] 04/07/2018, 10:11 AM                        Medical Problem List and Plan: 1.  Left side weakness with dysarthria and dysphagia secondary to acute right basal ganglia hemorrhage secondary to hypertensive crisis             -admit to inpatient rehab.  2. DVT Prophylaxis/Anticoagulation: SCDs 3. Pain Management:  Tylenol as needed 4. Mood:  Provide emotional support 5. Neuropsych: This patient is capable of making decisions on his own behalf. 6. Skin/Wound Care:  Routine skin checks 7. Fluids/Electrolytes/Nutrition:  Routine in and out's with follow-up chemistries upon admit 8. Dysphagia. Dysphagia #3 neck thick liquids. Follow-up speech therapy 9. Hypertension. Lisinopril 2.5 mg daily. Monitor with increased mobility 10. History of gout. Allopurinol 300 mg daily. 11. History of CAD with CABG. No chest pain or shortness of breath 12. CKD stage III. Baseline creatinine 1.40   Post Admission Physician Evaluation: 1. Functional deficits secondary  to right basal ganglia hemorrhage.  2. Patient is admitted to receive collaborative, interdisciplinary care between the physiatrist, rehab nursing staff, and therapy team. 3. Patient's level of medical complexity and substantial therapy needs  in context of that medical necessity cannot be provided at a lesser intensity of care such as a SNF. 4. Patient has experienced substantial functional loss from his/her baseline which was documented above under the "Functional History" and "Functional Status" headings.  Judging by the patient's diagnosis, physical exam, and functional history, the patient has potential for functional progress which will result in measurable gains while on inpatient rehab.  These gains will be of substantial and practical use upon discharge  in facilitating mobility and self-care at the household level. 5. Physiatrist will provide 24 hour management of medical needs as well as oversight of the therapy plan/treatment and provide guidance as appropriate regarding the interaction of the two. 6. The Preadmission Screening has been reviewed and patient status is unchanged unless otherwise stated above. 7. 24 hour rehab nursing will assist with bladder management, bowel management, safety, skin/wound care, disease management, medication administration, pain management and patient education  and help integrate therapy concepts, techniques,education, etc. 8. PT will assess and treat for/with: Lower extremity strength, range of motion, stamina, balance, functional mobility, safety, adaptive techniques and equipment, NMR, family education, visual-spatial awareness.   Goals are: supervision. 9. OT will assess and treat for/with: ADL's, functional mobility, safety, upper extremity strength, adaptive techniques and equipment, NMR, family education, community reentry, ego support.   Goals are: supervision. Therapy may proceed with showering this patient. 10. SLP will assess and treat for/with: cognition, communication, swallowing, family ed.  Goals are: supervision. 11. Case Management and Social Worker will assess and treat for psychological issues and discharge planning. 12. Team conference will be held weekly to assess progress toward  goals and to determine barriers to discharge. 13. Patient will receive at least 3 hours of therapy per day at least 5 days per week. 14. ELOS: 14-18 days       15. Prognosis:  excellent     I have personally performed a face to face diagnostic evaluation of this patient and formulated the key components of the plan.  Additionally, I have personally reviewed laboratory data, imaging studies, as well as relevant notes and concur with the physician assistant's documentation above.   Meredith Staggers, MD, [facility]     Lavon Paganini [place], PA-C 04/07/2018   The patient's status has not changed. The original post admission physician evaluation remains appropriate, and any changes from the pre-admission screening or documentation from the acute chart are noted above.  Meredith Staggers, MD 04/07/2018

## 2018-04-07 NOTE — Progress Notes (Signed)
  Speech Language Pathology Treatment: Dysphagia  Patient Details Name: Jimmy [name] MRN: [id number] DOB: Jul 28, 1928 Today's Date: 04/07/2018 Time: 1400-1410 SLP Time Calculation (min) (ACUTE ONLY): 10 min  Assessment / Plan / Recommendation Clinical Impression  Pt was seen for skilled ST targeting dysphagia goals.  Pt recalled results of yesterday's MBS and subsequent recommendations with supervision question cues.  He utilized recommended swallowing strategies pretty consistently until he became distracted during conversations with therapist.  When pt forgot to use the head turn to the left, he demonstrated delayed cough x1.  Pt reports constant cough which he feels worsens slightly with PO intake.  Currently, pt is afebrile and O2 sats are [facility] on room air.  Pt would be an excellent candidate for the water protocol with continued practice using postural strategies.  Pt was left in bed with bed alarm set.  Cognitive-linguistic evaluation also completed on this date.  Continue per current plan of care.    HPI HPI: 85 year old male admitted with left sided weakness and facial droop. CT scan showed right basal ganglia hemorrage. Patient given ativan for myoclonic jerking vs seizure activity on arrival with subsequent sedation. Also with h/o CVA and TIA, GERD.       SLP Plan  Continue with current plan of care       Recommendations  Diet recommendations: Dysphagia 3 (mechanical soft);Nectar-thick liquid Liquids provided via: Cup Medication Administration: Whole meds with puree Supervision: Full supervision/cueing for compensatory strategies Compensations: Slow rate;Small sips/bites Postural Changes and/or Swallow Maneuvers: Out of bed for meals;Seated upright 90 degrees;Upright 30-60 min after meal                Oral Care Recommendations: Oral care BID Follow up Recommendations: Inpatient Rehab SLP Visit Diagnosis: Dysphagia, pharyngeal phase (R13.13) Plan: Continue with current  plan of care       GO                Aastha Dayley, Selinda Orion 04/07/2018, 2:35 PM

## 2018-04-07 NOTE — Progress Notes (Signed)
IP rehab admissions - I met with patient and his sister in laws at the bedside.  All are in favor of CIR.  Patient has been cleared by attending MD for inpatient rehab admission.  Bed available and will admit to CIR today.  Call me for questions.  [phone]

## 2018-04-07 NOTE — Discharge Summary (Addendum)
Stroke Discharge Summary  [name]   MRN: [id number]      DOB: 1928-05-08  Date of Admission: 04/04/2018 Date of Discharge: 04/07/2018  Attending Physician:  Garvin Fila, MD, Stroke MD Consultant(s):   Alysia Penna, MD (Physical Medicine & Rehabtilitation)  Patient's PCP:  Ria Bush, MD  Discharge Diagnoses:  Principal Problem:   ICH (intracerebral hemorrhage) ([place]) Active Problems:   Mixed hyperlipidemia   Essential hypertension   Coronary atherosclerosis   History of CVA (cerebrovascular accident)   Dysphagia   Family history of stroke  Past Medical History:  Diagnosis Date  . CAD (coronary artery disease)    a. 04/2001 S/P CABGx 4;  b. 2008 MV:  EF 63% normal perfusion.  . Carotid stenosis    a. 11/2012 Carotid U/S:  RICA 35-32%, LICA 99-24%.  . Chronic kidney disease, stage 3 ([place])   . CLL (chronic lymphoblastic leukemia) 2009   Stage IV; Dr. Ivor Messier - referred to Dr. Lissa Merlin [facility] 07/2013 now on Rituxan (10/2013) --> stage 0 (09/2014)  . Diastolic CHF ([place])    a. 05/6832 Echo: EF 55-65%, mild conc LVH, Gr 1 DD, mild AS, triv AI, mildly dil Ao root (3.5 cm).  . GERD (gastroesophageal reflux disease) 1990s  . History of CVA (cerebrovascular accident) 2015   by MRI - remote L internal capsule  . History of herpes genitalis    valtrex daily  . Hyperlipemia 2002  . Hypertension 2002  . Mass of submandibular region 2015   referred to gen surg after chemo  . Mild aortic stenosis    a. 07/2011 Echo: Mild AS, triv AI.  [name] Stroke ([place])   . Thrombocytopenia ([place])    outpatient goals Hgb >9, plt >20  . TIA (transient ischemic attack)    04/2016   Past Surgical History:  Procedure Laterality Date  . ANGIOPLASTY  1998  . CATARACT EXTRACTION, BILATERAL     R 1/09, L 8/09  . COLONOSCOPY  11/29/1987   Normal  . COLONOSCOPY  02/07/2003   Hemm. Internal, focal proctitis, negative biopsy  . COLONOSCOPY  12/12/2004   Internal external  hemorrhoids, +proctits, negative biopsy  . CORONARY ANGIOPLASTY  1998  . CORONARY ARTERY BYPASS GRAFT  04/23/2001   x4, Dr. Pia Mau  . ESOPHAGOGASTRODUODENOSCOPY  11/29/1987   Gastritis  . ETT  12/10/2006   Persantine myoview nml  . HEMORROIDECTOMY  1954   Fissure repair, [place]  . HEPATIC ARTERY ANGIOPLASTY  1954   [place], [place]  . KIDNEY STONE SURGERY  1977   Dr Redmond Baseman  . LITHOTRIPSY  1990s   Multiple  . US ECHOCARDIOGRAPHY  07/2011   nl sys fxn, EF 55-60%, grade 1 diast dysfunction, mild AS, mildly elevated PA pressure    Medications to be continued on Rehab Allergies as of 04/07/2018      Reactions   New Skin Other (See Comments)   unknown   Tape Dermatitis      Medication List    STOP taking these medications   dipyridamole-aspirin 200-25 MG 12hr capsule Commonly known as:  AGGRENOX     TAKE these medications   allopurinol 300 MG tablet Commonly known as:  ZYLOPRIM TAKE 1 TABLET DAILY   BENADRYL ALLERGY 25 mg capsule Generic drug:  diphenhydrAMINE Take 25 mg by mouth daily as needed for itching, allergies or sleep.   cholecalciferol 1000 units tablet Commonly known as:  VITAMIN D Take 2,000 Units by  mouth daily.   lisinopril 5 MG tablet Commonly known as:  PRINIVIL,ZESTRIL TAKE 1 TABLET DAILY (NEW DOSE) What changed:  See the new instructions.   nitroGLYCERIN 0.4 MG SL tablet Commonly known as:  NITROSTAT Place 1 tablet (0.4 mg total) under the tongue every 5 (five) minutes as needed for chest pain (max 3 doses in 15 minutes).   RESOURCE THICKENUP CLEAR Powd Take 120 g by mouth as needed (nectar thick liquids).   senna-docusate 8.6-50 MG tablet Commonly known as:  Senokot-S Take 1 tablet by mouth 2 (two) times daily.   simvastatin 10 MG tablet Commonly known as:  ZOCOR TAKE 1 TABLET AT BEDTIME What changed:  when to take this   sodium chloride 0.9 % infusion Inject 500 mLs into the vein continuous.   valACYclovir 500 MG tablet Commonly known  as:  VALTREX TAKE 1 TABLET DAILY What changed:    when to take this  reasons to take this   vitamin B-12 1000 MCG tablet Commonly known as:  CYANOCOBALAMIN Take 1 tablet (1,000 mcg total) by mouth daily.       LABORATORY STUDIES CBC    Component Value Date/Time   WBC 7.2 04/06/2018 0612   RBC 4.28 04/06/2018 0612   HGB 14.3 04/06/2018 0612   HGB 11.2 (L) 08/17/2013 1201   HCT 41.3 04/06/2018 0612   HCT 35.0 (L) 08/17/2013 1201   PLT 146 (L) 04/06/2018 0612   PLT 76 (L) 08/17/2013 1201   MCV 96.5 04/06/2018 0612   MCV 104.5 (H) 08/17/2013 1201   MCH 33.4 04/06/2018 0612   MCHC 34.6 04/06/2018 0612   RDW 12.9 04/06/2018 0612   RDW 14.8 (H) 08/17/2013 1201   LYMPHSABS 1.5 04/04/2018 0102   LYMPHSABS 51.0 (H) 08/17/2013 1201   MONOABS 0.7 04/04/2018 0102   MONOABS 0.5 08/17/2013 1201   EOSABS 0.2 04/04/2018 0102   EOSABS 0.2 08/17/2013 1201   EOSABS 0.5 10/24/2009 0919   BASOSABS 0.0 04/04/2018 0102   BASOSABS 0.1 08/17/2013 1201   CMP    Component Value Date/Time   NA 135 04/06/2018 0612   NA 141 06/01/2013 0950   K 3.9 04/06/2018 0612   K 4.8 06/01/2013 0950   CL 107 04/06/2018 0612   CL 106 01/15/2012 1446   CO2 21 (L) 04/06/2018 0612   CO2 28 06/01/2013 0950   GLUCOSE 98 04/06/2018 0612   GLUCOSE 130 06/01/2013 0950   GLUCOSE 143 (H) 01/15/2012 1446   BUN 22 04/06/2018 0612   BUN 22.3 06/01/2013 0950   CREATININE 1.25 (H) 04/06/2018 0612   CREATININE 1.7 (H) 06/01/2013 0950   CALCIUM 9.2 04/06/2018 0612   CALCIUM 9.8 06/01/2013 0950   PROT 7.0 04/04/2018 0102   PROT 6.6 06/01/2013 0950   ALBUMIN 4.0 04/04/2018 0102   ALBUMIN 3.9 06/01/2013 0950   AST 22 04/04/2018 0102   AST 43 (H) 06/01/2013 0950   ALT 20 04/04/2018 0102   ALT 33 06/01/2013 0950   ALKPHOS 102 04/04/2018 0102   ALKPHOS 161 (H) 06/01/2013 0950   BILITOT 0.4 04/04/2018 0102   BILITOT 0.58 06/01/2013 0950   GFRNONAA 51 (L) 04/06/2018 0612   GFRAA 60 (L) 04/06/2018 0612    COAGS Lab Results  Component Value Date   INR 1.01 04/04/2018   INR 1.07 04/08/2016   INR 1.05 02/19/2016    SIGNIFICANT DIAGNOSTIC STUDIES Ct Head Code Stroke Wo Contrast 04/04/2018 1. 8.8 cc acute intraparenchymal hematoma centered at the posterior right lentiform nucleus  without significant regional mass effect. Underlying hypertensive etiology suspected.  2. Underlying advanced chronic microvascular ischemic disease with multiple remote lacunar infarcts involving the left basal ganglia and thalamus.   Repeat Ct Head Wo Contrast 04/05/2018 1. Slight interval increase in size of right basal ganglia intraparenchymal hemorrhage, now measuring 9.9 cc in volume, previously 8.8 cc on 04/04/2018. Associated vasogenic edema mildly increased without significant regional mass effect. 2. Otherwise stable appearance of the brain with no other new acute intracranial abnormality.  MRI Head 04/04/2018 No change or new finding by MRI. Acute hematoma/hemorrhagic infarction in the right basal ganglia with volume of 8.6 cc. Mild surrounding edema. Extensive small vessel ischemic changes elsewhere throughout the brain as outlined above.  MRA Head 04/04/2018 Negative intracranial MR angiography of the large and medium size vessels. No evidence of high-flow vascular abnormality in the region of hemorrhage.  Bilateral Carotid Dopplers - 04/05/2018 Right Carotid: Velocities in the right ICA are consistent with a 40-59% stenosis. Left Carotid: Velocities in the left ICA are consistent with a 1-39% stenosis. Vertebrals: Bilateral vertebral arteries demonstrate antegrade flow.  Chest Port 1 View 04/04/2018 1. Low lung volumes with right infrahilar and left midlung atelectasis.  2. Opacity at the left costophrenic angle likely soft tissue summation accentuated by rotation.      HISTORY OF PRESENT ILLNESS Jimmy [name] is an 85 y.o. male presenting from home via EMS with sudden onset of left sided  weakness. He has a history of stroke about 2 years ago with trouble talking and confusion. He takes Aggrenox at home. LKN 2200 on 04/03/2018 while laying in bed per wife. At about midnight, wife noted that he was weak on the left with a facial droop. EMS arrived at 12:15a and noted the same findings. Not on a blood thinner. BP en route was 152/75, HR NSR in 70's, sats 95% on RA and CBG 107. On arrival to the ED the patient continued to be weak on his left side. CT head in the ED revealed an acute right basal ganglia hemorrhage, most likely a hypertensive bleed. he was admitted to the neuro ICU for further evaluation and treatment.   HOSPITAL COURSE Jimmy [name] is a 85 y.o. male with history of previous strokes, HTN, HLD, CLL, CAD, CHF, and CKD presenting with left sided weakness and facial droop.   Acute right basal ganglia hemorrhage - hypertensive   Resultant dysarthria and left hemiplegia  CT head - 8.8 cc acute intraparenchymal hematoma centered at the posterior right lentiform nucleus - multiple remote infarcts.  MRI head - acute hematoma/hemorrhagic infarct right basal ganglia  MRA head  - Unremarkable   Repeat Head CT - increase right basal ganglia hemorrhage now 9.9 cc volume previously 8.8cc  Carotid Doppler - RICA: 09-81% stenosis, LICA 1-91% stenosis  dipyridamole SR 250 mg/aspirin 25 mg twice a day prior to admission, now on No antithrombotic given hemorrhage  Therapy recommendations:  CIR  Disposition:  CIR  Hypertension  Treated with cleviprex drip in the ICU  Home lisinopril 5, half dose resumed in hospital  BP now stable. Will increase to full home dose  BP goal normotensive  Hyperlipidemia  Lipid lowering medication PTA:  Zocor 10 mg daily  Current lipid lowering medication: on hold   Will continue statin at discharge  Dysphagia  Secondary to stroke  On D3 nectar thick liquids  SLP following  Other Stroke Risk Factors  Advanced  age  Former cigarette smoker   Hx  stroke/TIA  04/2016 Recurrent TIA vs atypical Migraine  01/2016 TIA  2015 L internal capsule infarct   Family hx stroke (brother)  Coronary artery disease  Other Active Problems  CKD Creatinine - 1.25  Thrombocytopenia, PLT - 131->146   DISCHARGE EXAM Blood pressure (!) 142/62, pulse 66, temperature 98.2 F (36.8 C), temperature source Oral, resp. rate 15, height 5\' 10"  (1.778 m), weight 75.8 kg, SpO2 93 %. Pleasant elderly gentleman not in distress. Always has a joke. Afebrile. Head is nontraumatic. Neck is supple without bruit. Cardiac exam no murmur or gallop. Lungs are clear to auscultation. Distal pulses are well felt. Neurological Exam :  Awake alert oriented x3.  Mild dysarthria.  No aphasia.  Extraocular movements are full range without nystagmus.  Blinks to threat bilaterally.  Mild left lower facial weakness.  Tongue midline.  Left hemiplegia with 2/5 left elbow flexion and extension otherwise 0/5.  Trace to flexion in the left foot and knee extension otherwise 0/5.  Tone is diminished on the left compared to the right.  Sensation is intact bilaterally.  Left plantar equivocal right downgoing.  Gait not tested. Discharge Diet   Diet Order            DIET DYS 3 Room service appropriate? Yes; Fluid consistency: Nectar Thick  Diet effective now             liquids  DISCHARGE PLAN  Disposition:  Transfer to [facility] for ongoing PT, OT and ST  Due to hemorrhage and risk of bleeding, do not take aspirin, aspirin-containing medications, or ibuprofen products   Recommend ongoing risk factor control by Primary Care Physician at time of discharge from inpatient rehabilitation.  Follow-up Ria Bush, MD in 2 weeks following discharge from rehab.  Follow-up in [facility] in 4 weeks following discharge from rehab, office to schedule an appointment.   35 minutes were spent  preparing discharge.  Burnetta Sabin, MSN, APRN, ANVP-BC, AGPCNP-BC Advanced Practice Stroke Nurse [place] for Schedule & Pager information 04/07/2018 2:10 PM  I have personally obtained history,examined this patient, reviewed notes, independently viewed imaging studies, participated in medical decision making and plan of care.ROS completed by me personally and pertinent positives fully documented  I have made any additions or clarifications directly to the above note. Agree with note above.    Antony Contras, MD Medical Director [facility] Pager: [phone] 04/07/2018 4:26 PM

## 2018-04-07 NOTE — PMR Pre-admission (Signed)
PMR Admission Coordinator Pre-Admission Assessment  Patient: Jimmy [name] is an 85 y.o., male MRN: [id number] DOB: 07-01-1928 Height: 5\' 10"  (177.8 cm) Weight: 75.8 kg              Insurance Information HMO: No    PPO:       PCP:       IPA:       80/20:       OTHER:   PRIMARY:  Medicare A and B      Policy#: [id number]      Subscriber: patient CM Name:        Phone#:       Fax#:   Pre-Cert#:        Employer: Retired Benefits:  Phone #:       Name: Checked in [place] [employer] [place]. Date: A=03/01/96 and B=04/01/04     Deduct: $1408      Out of Pocket Max: none      Life Max: N/A CIR: 100%      SNF: 100 days Outpatient: 80%     Co-Pay: 20% Home Health: 100$      Co-Pay: none DME: 80%     Co-Pay: 20% Providers: patient's choice  SECONDARY: Tricare for Life      Policy#: [id number]      Subscriber: patient CM Name:        Phone#:       Fax#:   Pre-Cert#:        Employer: Retired Benefits:  Phone #: [phone]     Name:   Eff. Date:       Deduct:        Out of Pocket Max:        Life Max:   CIR:        SNF:   Outpatient:       Co-Pay:   Home Health:        Co-Pay:   DME:       Co-Pay:    Medicaid Application Date:        Case Manager:   Disability Application Date:        Case Worker:    Emergency Contact Information Contact Information    Name Relation Home Work Mobile   [place]   [phone]   Enzio, [place] Daughter   [phone]     Current Medical History  Patient Admitting Diagnosis: R BG hemorrhage  History of Present Illness: An 85 year old right-handed male with history of CAD with CABG, CLL,CKD stage III, diastolic congestive heart failure, hypertension, CVA 2 years ago maintained on Aggrenox. Per chart review patient lives with spouse. Reportedly independently goes to the gym every morning. One level home with 4 steps to entry. Presented 04/04/2018 with sudden onset of left-sided weakness and dysarthria. Blood pressure 152/75. Cranial CT scan  showed an 8.8 mL acute intraparenchymal hematoma centered at the posterior right lentiform nucleus without significant regional mass effect.MRA negative.. Initially placed on Cardene drip for blood pressure control. Dysphagia #3 nectar thick liquids. Therapy evaluations completed with recommendations of physical medicine rehabilitation consult. Patient was admitted for a compress of rehabilitation program.    Complete NIHSS TOTAL: 8  Past Medical History  Past Medical History:  Diagnosis Date  . CAD (coronary artery disease)    a. 04/2001 S/P CABGx 4;  b. 2008 MV:  EF 63% normal perfusion.  . Carotid stenosis    a. 11/2012 Carotid U/S:  RICA 03-21%, LICA 22-48%.  [name]  Chronic kidney disease, stage 3 ([place])   . CLL (chronic lymphoblastic leukemia) 2009   Stage IV; Dr. Ivor Messier - referred to Dr. Lissa [facility] 07/2013 now on Rituxan (10/2013) --> stage 0 (09/2014)  . Diastolic CHF ([place])    a. 05/8099 Echo: EF 55-65%, mild conc LVH, Gr 1 DD, mild AS, triv AI, mildly dil Ao root (3.5 cm).  . GERD (gastroesophageal reflux disease) 1990s  . History of CVA (cerebrovascular accident) 2015   by MRI - remote L internal capsule  . History of herpes genitalis    valtrex daily  . Hyperlipemia 2002  . Hypertension 2002  . Mass of submandibular region 2015   referred to gen surg after chemo  . Mild aortic stenosis    a. 07/2011 Echo: Mild AS, triv AI.  [name] Stroke ([place])   . Thrombocytopenia ([place])    outpatient goals Hgb >9, plt >20  . TIA (transient ischemic attack)    04/2016    Family History  family history includes Alcohol abuse in his brother; Depression in his daughter; Diabetes in his brother and another family member; Heart attack in his brother; Heart disease in his mother; Hyperlipidemia in his father and mother; Hypertension in his father and mother; Kidney cancer in his sister; Stroke in his brother.  Prior Rehab/Hospitalizations: Was hospitalized 2 years ago after a TIA.  Had cardiac rehab in  2001, but no rehab since that time.  Has the patient had major surgery during 100 days prior to admission? No  Current Medications   Current Facility-Administered Medications:  .   stroke: mapping our early stages of recovery book, , Does not apply, Once, Kerney Elbe, MD .  0.9 %  sodium chloride infusion, 500 mL, Intravenous, Continuous, Garvin Fila, MD, Last Rate: 75 mL/hr at 04/07/18 0702, 500 mL at 04/07/18 0702 .  acetaminophen (TYLENOL) tablet 650 mg, 650 mg, Oral, Q4H PRN **OR** acetaminophen (TYLENOL) solution 650 mg, 650 mg, Per Tube, Q4H PRN **OR** acetaminophen (TYLENOL) suppository 650 mg, 650 mg, Rectal, Q4H PRN, Kerney Elbe, MD .  allopurinol (ZYLOPRIM) tablet 300 mg, 300 mg, Oral, Daily, Kerney Elbe, MD, 300 mg at 04/07/18 1129 .  [COMPLETED] labetalol (NORMODYNE,TRANDATE) injection 20 mg, 20 mg, Intravenous, Once, 20 mg at 04/04/18 0326 **AND** clevidipine (CLEVIPREX) infusion 0.5 mg/mL, 0-21 mg/hr, Intravenous, Continuous, Biby, Massie Kluver, NP, Stopped at 04/06/18 1200 .  labetalol (NORMODYNE,TRANDATE) injection 10 mg, 10 mg, Intravenous, Q2H PRN, Biby, Sharon L, NP, 10 mg at 04/06/18 1631 .  lisinopril (PRINIVIL,ZESTRIL) tablet 2.5 mg, 2.5 mg, Oral, Daily, Kerney Elbe, MD, 2.5 mg at 04/07/18 1130 .  pantoprazole (PROTONIX) injection 40 mg, 40 mg, Intravenous, QHS, Kerney Elbe, MD, 40 mg at 04/06/18 2213 .  RESOURCE THICKENUP CLEAR, , Oral, PRN, Garvin Fila, MD .  senna-docusate (Senokot-S) tablet 1 tablet, 1 tablet, Oral, BID, Kerney Elbe, MD, 1 tablet at 04/07/18 1130 .  valACYclovir (VALTREX) tablet 500 mg, 500 mg, Oral, Daily, Kerney Elbe, MD, 500 mg at 04/07/18 1131 .  vitamin B-12 (CYANOCOBALAMIN) tablet 1,000 mcg, 1,000 mcg, Oral, Daily, Kerney Elbe, MD, 1,000 mcg at 04/07/18 1131  Patients Current Diet:  Diet Order            DIET DYS 3 Room service appropriate? Yes; Fluid consistency: Nectar Thick  Diet effective now               Precautions / Restrictions Precautions Precautions: Fall Precaution Comments: left hemiparesis, left inattention Restrictions Weight Bearing  Restrictions: No   Has the patient had 2 or more falls or a fall with injury in the past year?No.  Patient reports 1 fall in the past year.  Prior Activity Level Community (5-7x/wk): Went to the gym daily, was driving.  Home Assistive Devices / Equipment Home Assistive Devices/Equipment: Eyeglasses, Grab bars around toilet, Grab bars in shower, Hearing aid Home Equipment: Walker - 2 wheels, Shower seat, Hand held shower head  Prior Device Use: Indicate devices/aids used by the patient prior to current illness, exacerbation or injury? None  Prior Functional Level Prior Function Level of Independence: Independent Comments: goes to gym every morning  Self Care: Did the patient need help bathing, dressing, using the toilet or eating?  Independent  Indoor Mobility: Did the patient need assistance with walking from room to room (with or without device)? Independent  Stairs: Did the patient need assistance with internal or external stairs (with or without device)? Independent  Functional Cognition: Did the patient need help planning regular tasks such as shopping or remembering to take medications? Independent  Current Functional Level Cognition  Overall Cognitive Status: Impaired/Different from baseline Current Attention Level: Selective Orientation Level: Oriented X4 Following Commands: Follows one step commands consistently Safety/Judgement: Decreased awareness of safety, Decreased awareness of deficits General Comments: Lt inattention    Extremity Assessment (includes Sensation/Coordination)  Upper Extremity Assessment: LUE deficits/detail LUE Deficits / Details: limited AROM proximally at shoulder, elbow grossly 2-/5; 0/5 distal to elbow; unable to use functionally LUE Sensation: decreased light touch, decreased  proprioception LUE Coordination: decreased fine motor, decreased gross motor  Lower Extremity Assessment: Defer to PT evaluation LLE Deficits / Details: grossly 2+/5    ADLs  Overall ADL's : Needs assistance/impaired Eating/Feeding: NPO Grooming: Moderate assistance, Wash/dry hands, Wash/dry face, Sitting Grooming Details (indicate cue type and reason): hand over hand support to wash R hand  Upper Body Bathing: Moderate assistance, Sitting Upper Body Bathing Details (indicate cue type and reason): hand over hand assist to wash R UE with L  Lower Body Bathing: +2 for physical assistance, Maximal assistance, Sit to/from stand Upper Body Dressing : Maximal assistance, Sitting Lower Body Dressing: Maximal assistance, +2 for physical assistance, Sit to/from stand Toilet Transfer: Minimal assistance, +2 for physical assistance, Ambulation Toilet Transfer Details (indicate cue type and reason): simulated to recliner  Functional mobility during ADLs: Minimal assistance, +2 for safety/equipment    Mobility  Overal bed mobility: Needs Assistance Bed Mobility: Rolling, Sidelying to Sit Rolling: Min assist Sidelying to sit: Min assist Supine to sit: Mod assist General bed mobility comments: cues for sequence with assist to direct attention and neck rotation to left, reach for rail and bend RLE hooking LLE to bring legs off bed.     Transfers  Overall transfer level: Needs assistance Equipment used: 1 person hand held assist, 2 person hand held assist Transfer via [place]: [place] Transfers: Sit to/from [employer] to Stand: Min assist, +2 safety/equipment General transfer comment: min assist to rise from bed and chair with increased time. cues for hand placement. Mod assist once standing for balance and weight shift    Ambulation / Gait / Stairs / Wheelchair Mobility  Ambulation/Gait Ambulation/Gait assistance: Mod assist, +2 safety/equipment Gait Distance (Feet): 5 Feet Assistive  device: 1 person hand held assist Gait Pattern/deviations: Step-to pattern, Narrow base of support General Gait Details: pt facing therapist with RUE on therapist arm and LUE tucked in therapist axilla pt with assist and cues to weight shift right with  pt able to advance LLE with cueing. Pt with difficulty moving LLE with turning and required assist to position leg. 2 trials of grossly 5' each with chair follow and 2 person assist for lines, chair, safety and positioning Gait velocity interpretation: <1.31 ft/sec, indicative of household ambulator    Posture / Balance Dynamic Sitting Balance Sitting balance - Comments: EOB 5 min with pt able to progress from mod assist to minguard for short periods with cues for posture and position with bil UE in lap. Pt performed propping on LUE x 5 with assist to transition back to midline Balance Overall balance assessment: Needs assistance Sitting-balance support: Bilateral upper extremity supported, Feet supported Sitting balance-Leahy Scale: Poor Sitting balance - Comments: EOB 5 min with pt able to progress from mod assist to minguard for short periods with cues for posture and position with bil UE in lap. Pt performed propping on LUE x 5 with assist to transition back to midline Postural control: Left lateral lean, Posterior lean Standing balance support: Bilateral upper extremity supported Standing balance-Leahy Scale: Poor Standing balance comment: mod assist for standing balance    Special needs/care consideration BiPAP/CPAP No CPM No Continuous Drip IV 0.9% NS at 75 mL/hr Dialysis No       Life Vest No Oxygen No Special Bed No Trach Size No Wound Vac (area) No Skin H/O skin cancers removed from neck, head, face and ears in the past                        Bowel mgmt: Last BM 04/07/18 Bladder mgmt: External catheter for incontinence Diabetic mgmt No    Previous Home Environment Living Arrangements: Spouse/significant other Available Help at  Discharge: Family Type of Home: House Home Layout: One level Home Access: Stairs to enter Entrance Stairs-Rails: Right, Left Entrance Stairs-Number of Steps: 4 in front and in garage, ramp on back Bathroom Shower/Tub: Multimedia programmer: Standard Home Care Services: No  Discharge Living Setting Plans for Discharge Living Setting: House, Lives with (comment)(Lives with wife.) Type of Home at Discharge: House Discharge Home Layout: One level Discharge Home Access: Stairs to enter Entrance Stairs-Number of Steps: 5 steps entry Discharge Bathroom Shower/Tub: Walk-in shower, Door Discharge Bathroom Toilet: Handicapped height Discharge Bathroom Accessibility: Yes How Accessible: Accessible via walker Does the patient have any problems obtaining your medications?: No  Social/Family/Support Systems Patient Roles: Spouse, Parent, Other (Comment)(Has a wife, daughter and sisters in law.)  Son passed away from Lung cancer. Contact Information: Treyvion Durkee - wife Anticipated Caregiver: Wife, daughter, sisters in law Anticipated Caregiver's Contact Information: [name] - wife - [phone] Ability/Limitations of Caregiver: Wife can provide supervision.  Others can assist as needed. Caregiver Availability: 24/7 Discharge Plan Discussed with Primary Caregiver: Yes Is Caregiver In Agreement with Plan?: Yes Does Caregiver/Family have Issues with Lodging/Transportation while Pt is in Rehab?: No  Goals/Additional Needs Patient/Family Goal for Rehab: PT/OT/SLP supervision goals Expected length of stay: 14-18 days Cultural Considerations: [employer] Dietary Needs: Dys 3, nectar thick liquids Equipment Needs: TBD Pt/Family Agrees to Admission and willing to participate: Yes Program Orientation Provided & Reviewed with Pt/Caregiver Including Roles  & Responsibilities: Yes  Decrease burden of Care through IP rehab admission: N/A  Possible need for SNF placement upon discharge: Not  anticipated  Patient Condition: This patient's medical and functional status has changed since the consult dated: 04/05/18 in which the Rehabilitation Physician determined and documented that the patient's condition is appropriate for intensive  rehabilitative care in an inpatient rehabilitation facility. See "History of Present Illness" (above) for medical update. Functional changes are: Currently requiring mod assist +2 to ambulate 5 feet +1 HHA. Patient's medical and functional status update has been discussed with the Rehabilitation physician and patient remains appropriate for inpatient rehabilitation. Will admit to inpatient rehab today.  Preadmission Screen Completed By:  Retta Diones, 04/07/2018 12:04 PM ______________________________________________________________________   Discussed status with Dr. Naaman Plummer on 04/07/18 at 1204 and received telephone approval for admission today.  Admission Coordinator:  Retta Diones, time 1204/Date 04/07/18

## 2018-04-07 NOTE — Progress Notes (Signed)
Physical Therapy Treatment Patient Details Name: Jimmy [name] MRN: [id number] DOB: 1928/11/01 Today's Date: 04/07/2018    History of Present Illness Pt adm with lt sided weakness and CT showed acute right basal ganglia hemorrhage. PMH - HTN, CLL, CABG, carotid stenosis, CVA, CAD, CHF    PT Comments    Pt performed gait training and functional mobility during session.  He continues to present with L side weakness for lower extremity and L arm appears to be flaccid.  Pt is making good gains and progressing well as evident by gait training this pm.  Plan to d/c to CIR remains appropriate and patient is agreeable at this time.    Follow Up Recommendations  CIR     Equipment Recommendations  Other (comment)(hemiwalker)    Recommendations for Other Services Rehab consult     Precautions / Restrictions Precautions Precautions: Fall Precaution Comments: left hemiparesis, left inattention Restrictions Weight Bearing Restrictions: No    Mobility  Bed Mobility Overal bed mobility: Needs Assistance Bed Mobility: Rolling;Sidelying to Sit Rolling: Mod assist;Max assist(mod to roll L and reach with R hand max to roll to R as he is unable to reach with L hand.  ) Sidelying to sit: Mod assist       General bed mobility comments: Cues for rolling to R and to L for perianal care.    Transfers Overall transfer level: Needs assistance Equipment used: 1 person hand held assist;2 person hand held assist Transfers: Sit to/from Stand Sit to Stand: +2 physical assistance;Min assist         General transfer comment: Cues for foot placement and  assistance to boost into standing.  Pt with strong lateral lean and anterior placement on trunk.  Pt required cues for hip extension.    Ambulation/Gait Ambulation/Gait assistance: Mod assist;+2 physical assistance Gait Distance (Feet): 20 Feet(+ additonal trial of 8 ft before becoming fatigued.  ) Assistive device: 1 person hand held  assist Gait Pattern/deviations: Step-to pattern;Narrow base of support;Decreased stride length;Decreased stance time - left;Decreased step length - left;Trunk flexed     General Gait Details: Cues and faciliation for lateral weight shifting to improve stride on L side.   Pt throwing weight forward and required cues for forward gaze and upper trunk control.     Stairs             Wheelchair Mobility    Modified Rankin (Stroke Patients Only) Modified Rankin (Stroke Patients Only) Pre-Morbid Rankin Score: No symptoms Modified Rankin: Moderately severe disability     Balance Overall balance assessment: Needs assistance Sitting-balance support: Bilateral upper extremity supported;Feet supported Sitting balance-Leahy Scale: Poor Sitting balance - Comments: EOB 5 min with pt able to progress from mod assist to minguard for short periods with cues for posture and position with bil UE in lap. Pt performed propping on LUE x 5 with assist to transition back to midline Postural control: Left lateral lean;Posterior lean   Standing balance-Leahy Scale: Poor Standing balance comment: mod assist for standing balance                            Cognition Arousal/Alertness: Awake/alert Behavior During Therapy: WFL for tasks assessed/performed Overall Cognitive Status: Impaired/Different from baseline Area of Impairment: Memory;Following commands;Safety/judgement;Awareness;Problem solving;Attention                   Current Attention Level: Selective Memory: Decreased short-term memory Following Commands: Follows one step commands consistently  Safety/Judgement: Decreased awareness of safety;Decreased awareness of deficits Awareness: Intellectual Problem Solving: Requires verbal cues;Requires tactile cues General Comments: Lt inattention      Exercises      General Comments        Pertinent Vitals/Pain Pain Assessment: No/denies pain    Home Living      Available Help at Discharge: Family Type of Home: House              Prior Function            PT Goals (current goals can now be found in the care plan section) Acute Rehab PT Goals Patient Stated Goal: take shower Potential to Achieve Goals: Good Progress towards PT goals: Progressing toward goals    Frequency    Min 4X/week      PT Plan Current plan remains appropriate    Co-evaluation PT/OT/SLP Co-Evaluation/Treatment: Yes            AM-PAC PT "6 Clicks" Mobility   Outcome Measure  Help needed turning from your back to your side while in a flat bed without using bedrails?: A Lot Help needed moving from lying on your back to sitting on the side of a flat bed without using bedrails?: A Lot Help needed moving to and from a bed to a chair (including a wheelchair)?: A Lot Help needed standing up from a chair using your arms (e.g., wheelchair or bedside chair)?: A Lot Help needed to walk in hospital room?: A Lot Help needed climbing 3-5 steps with a railing? : Total 6 Click Score: 11    End of Session Equipment Utilized During Treatment: Gait belt Activity Tolerance: Patient tolerated treatment well Patient left: in chair;with call bell/phone within reach;with family/visitor present;with nursing/sitter in room;with chair alarm set Nurse Communication: Mobility status PT Visit Diagnosis: Other abnormalities of gait and mobility (R26.89);Hemiplegia and hemiparesis Hemiplegia - Right/Left: Left Hemiplegia - dominant/non-dominant: Non-dominant Hemiplegia - caused by: Nontraumatic intracerebral hemorrhage     Time: 1443-1511 PT Time Calculation (min) (ACUTE ONLY): 28 min  Charges:  $Gait Training: 8-22 mins $Therapeutic Activity: 8-22 mins                     Jimmy [name], PTA Acute Rehabilitation Services Pager [phone] Office [phone]     [name] 04/07/2018, 4:19 PM

## 2018-04-07 NOTE — Care Management Note (Signed)
Case Management Note  Patient Details  Name: Jimmy [name] MRN: [id number] Date of Birth: 02/02/1929  Subjective/Objective:                    Action/Plan: Pt discharging to CIR today. CM signing off.   Expected Discharge Date:  04/07/18               Expected Discharge Plan:  [place]  In-House Referral:     Discharge planning Services  CM Consult  Post Acute Care Choice:    Choice offered to:     DME Arranged:    DME Agency:     HH Arranged:    HH Agency:     Status of Service:  Completed, signed off  If discussed at [employer], dates discussed:    Additional Comments:  Pollie Friar, RN 04/07/2018, 2:22 PM

## 2018-04-07 NOTE — Progress Notes (Signed)
Modified Barium Swallow Progress Note - late entry  Patient Details  Name: Jimmy [name] MRN: [id number] Date of Birth: 08-20-1928  Today's Date: 04/07/2018  Modified Barium Swallow completed.  Full report located under Chart Review in the Imaging Section.  Brief recommendations include the following:  Clinical Impression Pt demonstrates a primary pharyngeal dysphagia. Deficits include delayed swallow initaition and suspicion of decreased mobility of left sided arytenoid for laryngeal closure. Oral transit is adequate but nectar and thinner boluses teaspoon size and greater arrive at the pyriforms when pt initaites swallow with aspiraiton of significant severity with sensation. A 1/2 teaspoon of honey slows down bolus flow to allow for swallow trigger before it reaches pyrifroms, but trace aspiration during the swallow is still observed, not improved with chin tuck. 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 80% of trials with honey, nectar and thin were tolerated with a head turn left without aspiration. Pt was independent by the end of test. WHen pushed to alternate solids and liquids there were some aspiration events despite the head turn due to mild residuals. Pt reports he never drinks fluids while eating. He also reports he really only drinks water. Will initiate a mechanical soft diet with nectar thick liquids and a head turn left. Plan to upgrade to at least a water protocol with thin water over the next few days as pt practices head turn.       Jimmy [place], MA [place]  Acute Rehabilitation Services Pager [phone] Office [phone]    Lynann [name] 04/07/2018,10:09 AM

## 2018-04-08 ENCOUNTER — Inpatient Hospital Stay (HOSPITAL_COMMUNITY): Payer: Medicare Other | Admitting: Speech Pathology

## 2018-04-08 ENCOUNTER — Inpatient Hospital Stay (HOSPITAL_COMMUNITY): Payer: Medicare Other

## 2018-04-08 ENCOUNTER — Inpatient Hospital Stay (HOSPITAL_COMMUNITY): Payer: Medicare Other | Admitting: Occupational Therapy

## 2018-04-08 DIAGNOSIS — I615 Nontraumatic intracerebral hemorrhage, intraventricular: Secondary | ICD-10-CM

## 2018-04-08 DIAGNOSIS — I69391 Dysphagia following cerebral infarction: Secondary | ICD-10-CM

## 2018-04-08 LAB — CBC WITH DIFFERENTIAL/PLATELET
Abs Immature Granulocytes: 0.02 10*3/uL (ref 0.00–0.07)
Basophils Absolute: 0 10*3/uL (ref 0.0–0.1)
Basophils Relative: 1 %
Eosinophils Absolute: 0.2 10*3/uL (ref 0.0–0.5)
Eosinophils Relative: 4 %
HCT: 40.5 % (ref 39.0–52.0)
Hemoglobin: 13.7 g/dL (ref 13.0–17.0)
Immature Granulocytes: 0 %
Lymphocytes Relative: 17 %
Lymphs Abs: 0.9 10*3/uL (ref 0.7–4.0)
MCH: 32.2 pg (ref 26.0–34.0)
MCHC: 33.8 g/dL (ref 30.0–36.0)
MCV: 95.1 fL (ref 80.0–100.0)
Monocytes Absolute: 0.7 10*3/uL (ref 0.1–1.0)
Monocytes Relative: 13 %
Neutro Abs: 3.5 10*3/uL (ref 1.7–7.7)
Neutrophils Relative %: 65 %
Platelets: 123 10*3/uL — ABNORMAL LOW (ref 150–400)
RBC: 4.26 MIL/uL (ref 4.22–5.81)
RDW: 12.7 % (ref 11.5–15.5)
WBC: 5.3 10*3/uL (ref 4.0–10.5)
nRBC: 0 % (ref 0.0–0.2)

## 2018-04-08 LAB — COMPREHENSIVE METABOLIC PANEL
ALT: 17 U/L (ref 0–44)
AST: 19 U/L (ref 15–41)
Albumin: 3.4 g/dL — ABNORMAL LOW (ref 3.5–5.0)
Alkaline Phosphatase: 86 U/L (ref 38–126)
Anion gap: 8 (ref 5–15)
BUN: 15 mg/dL (ref 8–23)
CO2: 24 mmol/L (ref 22–32)
Calcium: 9.2 mg/dL (ref 8.9–10.3)
Chloride: 105 mmol/L (ref 98–111)
Creatinine, Ser: 1.29 mg/dL — ABNORMAL HIGH (ref 0.61–1.24)
GFR calc Af Amer: 57 mL/min — ABNORMAL LOW (ref >60)
GFR calc non Af Amer: 50 mL/min — ABNORMAL LOW (ref >60)
Glucose, Bld: 108 mg/dL — ABNORMAL HIGH (ref 70–99)
Potassium: 3.7 mmol/L (ref 3.5–5.1)
Sodium: 137 mmol/L (ref 135–145)
Total Bilirubin: 1.2 mg/dL (ref 0.3–1.2)
Total Protein: 6.1 g/dL — ABNORMAL LOW (ref 6.5–8.1)

## 2018-04-08 NOTE — Care Management Note (Signed)
[facility] Individual Statement of Services  Patient Name:  KHADEEM ROCKETT  Date:  04/08/2018  Welcome to the [facility].  Our goal is to provide you with an individualized program based on your diagnosis and situation, designed to meet your specific needs.  With this comprehensive rehabilitation program, you will be expected to participate in at least 3 hours of rehabilitation therapies Monday-Friday, with modified therapy programming on the weekends.  Your rehabilitation program will include the following services:   Physical Therapy (PT), Occupational Therapy (OT), Speech Therapy (ST), 24 hour per day rehabilitation nursing, Therapeutic Recreaction (TR), Case Management (Social Worker), Rehabilitation Medicine, Nutrition Services and Pharmacy Services Weekly team conferences will be held on Wednesday to discuss your progress.  Your Social Worker will talk with you frequently to get your input and to update you on team discussions.  Team conferences with you and your family in attendance may also be held.  Expected length of stay: 16-21 days  Overall anticipated outcome: supervision with cueing-CGA with stairs and tub transfers  Depending on your progress and recovery, your program may change. Your Social Worker will coordinate services and will keep you informed of any changes. Your Social Worker's name and contact numbers are listed  below.  The following services may also be recommended but are not provided by the [facility] will be made to provide these services after discharge if needed.  Arrangements include referral to agencies that provide these services.  Your insurance has been verified to be:  Medicare & Tricare Your primary doctor is:  Ria Bush  Pertinent information will be shared with your  doctor and your insurance company.  Social Worker:  Ovidio Kin, [place] or ([phone]  Information discussed with and copy given to patient by: Elease Hashimoto, 04/08/2018, 9:29 AM

## 2018-04-08 NOTE — Progress Notes (Signed)
Kirsteins, Luanna Salk, MD  Physician  Physical Medicine and Rehabilitation  Consult Note  Signed  Date of Service:  04/05/2018 11:57 AM       Related encounter: ED to Hosp-Admission (Discharged) from 04/04/2018 in [facility]      Signed      Expand All Collapse All    Show:Clear all [x] Manual[x] Template[] Copied  Added by: [x] Kirsteins, Luanna Salk, MD  [] Hover for details Physical Medicine and Rehabilitation Consult Reason for Consult: Left-sided weakness, rehabilitation for stroke Referring Phsyician: Naftula Donahue is an 85 y.o. male.   HPI: Patient presented on 04/02/2017 with sudden onset left-sided weakness.  Prior history of CVA 2 years ago.  CT showed an acute right basal ganglia hemorrhage felt to be hypertensive.  Patient initially on Cardene drip Speech therapy evaluation on 04/04/2018 indicates that the patient was lethargic but arousable needing moderate to max cues to sustain attention for examination.  Dysarthria due to left-sided facial weakness.  Patient placed n.p.o. because of clinical evidence of severe pharyngeal dysphasia with poor secretion management. Physical therapy evaluation on 04/04/2018, mod assist supine to sit sit to stand transfers plus to mod assist, gait was not tested.  Patient able to sit at edge of bed for 12 minutes  Review of Systems -Review of Systems  Constitutional: Negative for chills and fever.  HENT: Negative for hearing loss and nosebleeds.   Eyes: Negative for blurred vision, double vision and pain.  Respiratory: Negative for cough, shortness of breath, wheezing and stridor.   Cardiovascular: Negative for chest pain and palpitations.  Gastrointestinal: Negative for heartburn, nausea and vomiting.       NPO  Genitourinary:       Condom cath  Musculoskeletal: Negative for joint pain and myalgias.  Skin: Negative for itching and rash.  Neurological: Positive for focal weakness. Negative for dizziness, sensory  change, speech change and headaches.  Endo/Heme/Allergies: Negative.   Psychiatric/Behavioral: Negative for hallucinations. The patient is not nervous/anxious.         Past Medical History:  Diagnosis Date  . CAD (coronary artery disease)    a. 04/2001 S/P CABGx 4;  b. 2008 MV:  EF 63% normal perfusion.  . Carotid stenosis    a. 11/2012 Carotid U/S:  RICA 76-73%, LICA 41-93%.  . Chronic kidney disease, stage 3 ([place])   . CLL (chronic lymphoblastic leukemia) 2009   Stage IV; Dr. Ivor Messier - referred to Dr. L[facility] 07/2013 now on Rituxan (10/2013) --> stage 0 (09/2014)  . Diastolic CHF ([place])    a. 09/9022 Echo: EF 55-65%, mild conc LVH, Gr 1 DD, mild AS, triv AI, mildly dil Ao root (3.5 cm).  . GERD (gastroesophageal reflux disease) 1990s  . History of CVA (cerebrovascular accident) 2015   by MRI - remote L internal capsule  . History of herpes genitalis    valtrex daily  . Hyperlipemia 2002  . Hypertension 2002  . Mass of submandibular region 2015   referred to gen surg after chemo  . Mild aortic stenosis    a. 07/2011 Echo: Mild AS, triv AI.  [name] Stroke ([address])   . Thrombocytopenia ([place])    outpatient goals Hgb >9, plt >20  . TIA (transient ischemic attack)    04/2016        Past Surgical History:  Procedure Laterality Date  . ANGIOPLASTY  1998  . CATARACT EXTRACTION, BILATERAL     R 1/09, L 8/09  . COLONOSCOPY  11/29/1987  Normal  . COLONOSCOPY  02/07/2003   Hemm. Internal, focal proctitis, negative biopsy  . COLONOSCOPY  12/12/2004   Internal external hemorrhoids, +proctits, negative biopsy  . CORONARY ANGIOPLASTY  1998  . CORONARY ARTERY BYPASS GRAFT  04/23/2001   x4, Dr. Pia Mau  . ESOPHAGOGASTRODUODENOSCOPY  11/29/1987   Gastritis  . ETT  12/10/2006   Persantine myoview nml  . HEMORROIDECTOMY  1954   Fissure repair, [place]  . HEPATIC ARTERY ANGIOPLASTY  1954   [place], [place]  . KIDNEY STONE SURGERY  1977   Dr Redmond Baseman    . LITHOTRIPSY  1990s   Multiple  . US ECHOCARDIOGRAPHY  07/2011   nl sys fxn, EF 55-60%, grade 1 diast dysfunction, mild AS, mildly elevated PA pressure        Family History  Problem Relation Age of Onset  . Hypertension Mother   . Heart disease Mother   . Hyperlipidemia Mother   . Hypertension Father   . Hyperlipidemia Father   . Kidney cancer Sister        Renal cell cancer  . Alcohol abuse Brother   . Diabetes Brother   . Stroke Brother   . Heart attack Brother        MI  . Diabetes Other        Sister's daughter  . Depression Daughter        Bipolar   Social History:  reports that he quit smoking about 39 years ago. He has a 60.00 pack-year smoking history. He has never used smokeless tobacco. He reports that he does not drink alcohol or use drugs. Allergies:       Allergies  Allergen Reactions  . New Skin Other (See Comments)    unknown  . Tape Dermatitis         Medications Prior to Admission  Medication Sig Dispense Refill  . allopurinol (ZYLOPRIM) 300 MG tablet TAKE 1 TABLET DAILY (Patient taking differently: Take 300 mg by mouth daily. ) 90 tablet 1  . cholecalciferol (VITAMIN D) 1000 units tablet Take 2,000 Units by mouth daily.    . diphenhydrAMINE (BENADRYL ALLERGY) 25 mg capsule Take 25 mg by mouth daily as needed for itching, allergies or sleep.     . dipyridamole-aspirin (AGGRENOX) 200-25 MG 12hr capsule TAKE 1 CAPSULE TWICE A DAY (Patient taking differently: Take 1 capsule by mouth 2 (two) times daily. ) 180 capsule 1  . lisinopril (PRINIVIL,ZESTRIL) 5 MG tablet TAKE 1 TABLET DAILY (NEW DOSE) (Patient taking differently: Take 5 mg by mouth daily. ) 90 tablet 3  . nitroGLYCERIN (NITROSTAT) 0.4 MG SL tablet Place 1 tablet (0.4 mg total) under the tongue every 5 (five) minutes as needed for chest pain (max 3 doses in 15 minutes). 25 tablet 3  . simvastatin (ZOCOR) 10 MG tablet TAKE 1 TABLET AT BEDTIME (Patient taking differently:  Take 10 mg by mouth daily. ) 90 tablet 3  . valACYclovir (VALTREX) 500 MG tablet TAKE 1 TABLET DAILY (Patient taking differently: Take 500 mg by mouth daily as needed (outbreak). ) 90 tablet 4  . vitamin B-12 (CYANOCOBALAMIN) 1000 MCG tablet Take 1 tablet (1,000 mcg total) by mouth daily.      Home: Home Living Family/patient expects to be discharged to:: Private residence Living Arrangements: Spouse/significant other Available Help at Discharge: Family Type of Home: House Home Access: Stairs to enter [employer] of Steps: 4 in front and in garage, ramp on back Entrance Stairs-Rails: Right, Left(in garage only on  lt side) Home Layout: One level Bathroom Shower/Tub: Multimedia programmer: Standard Home Equipment: Environmental consultant - 2 wheels, Shower seat, Hand held shower head  Functional History: Prior Function Comments: goes to gym every morning Functional Status:  Mobility: Ambulation/Gait General Gait Details: Unable  ADL:  Cognition: Cognition Overall Cognitive Status: Impaired/Different from baseline Orientation Level: Oriented X4 Cognition Arousal/Alertness: Awake/alert Behavior During Therapy: Impulsive Overall Cognitive Status: Impaired/Different from baseline Area of Impairment: Memory, Following commands, Safety/judgement, Awareness, Problem solving, Attention Current Attention Level: Selective Memory: Decreased short-term memory Following Commands: Follows one step commands consistently Safety/Judgement: Decreased awareness of safety, Decreased awareness of deficits Awareness: Intellectual Problem Solving: Requires verbal cues, Requires tactile cues General Comments: Lt neglect/inattention  Blood pressure 138/65, pulse 70, temperature 97.9 F (36.6 C), temperature source Oral, resp. rate (!) 23, height 5\' 10"  (1.778 m), weight 75.8 kg, SpO2 93 %. Physical Exam Visual fields are intact confrontation testing, extraocular muscles are  intact General: No acute distress Mood and affect are appropriate Heart: Regular rate and rhythm no rubs murmurs or extra sounds Lungs: Clear to auscultation, breathing unlabored, no rales or wheezes Abdomen: Positive bowel sounds, soft nontender to palpation, nondistended Extremities: No clubbing, cyanosis, or edema Skin: No evidence of breakdown, no evidence of rash Neurologic: Cranial nerves II through XII intact, motor strength is 5/5 in bilateral deltoid, bicep, tricep, grip, hip flexor, knee extensors, ankle dorsiflexor and plantar flexor Sensory exam normal sensation to light touch  in bilateral upper and lower extremities Motor strength is 3- left deltoid 4- bicep tricep finger flexors and extensors with decreased motor control in the left side 3- at the left hip flexor knee extensor ankle dorsiflexor on the left 5/5 strength on the right side Musculoskeletal: Full range of motion in all 4 extremities. 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 Does the need for close, 24 hr/day medical supervision in concert with the patient's rehab needs make it unreasonable for this patient to be served in a less intensive setting? Yes 2. Co-Morbidities requiring supervision/potential complications: [place] artery disease, history of congestive heart failure, hypertension 3. Due to bladder management, bowel management, safety, skin/wound care, disease management, medication administration, pain management and patient education, does the patient require 24 hr/day rehab nursing? Yes 4. Does the patient require coordinated care of a physician, rehab nurse, PT (1-2 hrs/day, 5 days/week), OT (1-2 hrs/day, 5 days/week) and SLP (.5-1 hrs/day, 5 days/week) to address physical and functional deficits in the context of the above medical diagnosis(es)? Yes Addressing deficits in the following areas: balance, endurance, locomotion,  strength, transferring, bowel/bladder control, bathing, dressing, feeding, grooming, toileting, swallowing and psychosocial support 5. Can the patient actively participate in an intensive therapy program of at least 3 hrs of therapy per day at least 5 days per week? Yes 6. The potential for patient to make measurable gains while on inpatient rehab is excellent 7. Anticipated functional outcomes upon discharge from inpatient rehab are supervision  with PT, supervision with OT, supervision with SLP. 8. Estimated rehab length of stay to reach the above functional goals is: 14 to 18 days 9. Anticipated D/C setting: Home 10. Anticipated post D/C treatments: [place] therapy 11. Overall Rehab/Functional Prognosis: excellent  RECOMMENDATIONS: This patient's condition is appropriate for continued rehabilitative care in the following setting: CIR Patient has agreed to participate in recommended program. Yes Note that insurance prior authorization may be required for reimbursement  for recommended care.  Comment: Will be getting modified barium swallow in a.m.         Routing History

## 2018-04-08 NOTE — Progress Notes (Signed)
[place] PHYSICAL MEDICINE & REHABILITATION PROGRESS NOTE   Subjective/Complaints:  No issues overnite, coughs with liquids reviewed MBS.  Pt with hx of neck pain keeps head tilted to Right   ROS- denies CP, SOB, N/V/D  Objective:   Dg Swallowing Func-speech Pathology  Result Date: 04/07/2018 Objective Swallowing Evaluation: Type of Study: MBS-Modified Barium Swallow Study  Patient Details Name: Jimmy [name] Date of Birth: November 19, 1928 Today's Date: 04/07/2018 Time: SLP Start Time (ACUTE ONLY): 16 -SLP Stop Time (ACUTE ONLY): 1100 SLP Time Calculation (min) (ACUTE ONLY): 30 min Past Medical History: Past Medical History: Diagnosis Date . CAD (coronary artery disease)   a. 04/2001 S/P CABGx 4;  b. 2008 MV:  EF 63% normal perfusion. . Carotid stenosis   a. 11/2012 Carotid U/S:  RICA 45-80%, LICA 99-83%. . Chronic kidney disease, stage 3 ([place])  . CLL (chronic lymphoblastic leukemia) 2009  Stage IV; Dr. Ivor Messier - referred to Dr. Lissa Merlin [facility] 07/2013 now on Rituxan (10/2013) --> stage 0 (09/2014) . Diastolic CHF ([place])   a. 05/8248 Echo: EF 55-65%, mild conc LVH, Gr 1 DD, mild AS, triv AI, mildly dil Ao root (3.5 cm). . GERD (gastroesophageal reflux disease) 1990s . History of CVA (cerebrovascular accident) 2015  by MRI - remote L internal capsule . History of herpes genitalis   valtrex daily . Hyperlipemia 2002 . Hypertension 2002 . Mass of submandibular region 2015  referred to gen surg after chemo . Mild aortic stenosis   a. 07/2011 Echo: Mild AS, triv AI. [name] Stroke ([place])  . Thrombocytopenia ([place])   outpatient goals Hgb >9, plt >20 . TIA (transient ischemic attack)   04/2016 Past Surgical History: Past Surgical History: Procedure Laterality Date . ANGIOPLASTY  1998 . CATARACT EXTRACTION, BILATERAL    R 1/09, L 8/09 . COLONOSCOPY  11/29/1987  Normal . COLONOSCOPY  02/07/2003  Hemm. Internal, focal proctitis, negative biopsy . COLONOSCOPY  12/12/2004  Internal external hemorrhoids, +proctits,  negative biopsy . CORONARY ANGIOPLASTY  1998 . CORONARY ARTERY BYPASS GRAFT  04/23/2001  x4, Dr. Pia Mau . ESOPHAGOGASTRODUODENOSCOPY  11/29/1987  Gastritis . ETT  12/10/2006  Persantine myoview nml . HEMORROIDECTOMY  1954  Fissure repair, [place] . HEPATIC ARTERY ANGIOPLASTY  1954  [place] . KIDNEY STONE SURGERY  1977  Dr Redmond Baseman . LITHOTRIPSY  1990s  Multiple . US ECHOCARDIOGRAPHY  07/2011  nl sys fxn, EF 55-60%, grade 1 diast dysfunction, mild AS, mildly elevated PA pressure HPI: 85 year old male admitted with left sided weakness and facial droop. CT scan showed right basal ganglia hemorrage. Patient given ativan for myoclonic jerking vs seizure activity on arrival with subsequent sedation. Also with h/o CVA and TIA, GERD.  No data recorded Assessment / Plan / Recommendation CHL IP CLINICAL IMPRESSIONS 04/06/2018 Clinical Impression Pt demonstrates a primary pharyngeal dysphagia. Deficits include delayed swallow initaition and suspicion of decreased mobility of left sided arytenoid for laryngeal closure. Oral transit is adequate but nectar and thinner boluses teaspoon size and greater arrive at the pyriforms when pt initaites swallow with aspiraiton of significant severity with sensation. A 1/2 teaspoon of honey slows down bolus flow to allow for swallow trigger before it reaches pyrifroms, but trace aspiration during the swallow is still observed, not improved with chin tuck. 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 80% of trials with honey, nectar and thin were tolerated with a head turn left without aspiration. Pt was independent by  the end of test. WHen pushed to alternate solids and liquids there were some aspiration events despite the head turn due to mild residuals. Pt reports he never drinks fluids while eating. He also reports he really only drinks water. Will initiate a mechanical soft diet with nectar thick liquids and a head turn left.  Plan to upgrade to at least a water protocol with thin water over the next few days as pt practices head turn.  SLP Visit Diagnosis Dysphagia, pharyngeal phase (R13.13) Attention and concentration deficit following -- Frontal lobe and executive function deficit following -- Impact on safety and function Moderate aspiration risk   CHL IP TREATMENT RECOMMENDATION 04/06/2018 Treatment Recommendations F/U MBS in --- days (Comment);Therapy as outlined in treatment plan below   Prognosis 04/04/2018 Prognosis for Safe Diet Advancement Good Barriers to Reach Goals -- Barriers/Prognosis Comment -- CHL IP DIET RECOMMENDATION 04/06/2018 SLP Diet Recommendations Nectar thick liquid;Dysphagia 3 (Mech soft) solids Liquid Administration via Cup;Straw Medication Administration Whole meds with puree Compensations Slow rate;Small sips/bites Postural Changes (No Data)   CHL IP OTHER RECOMMENDATIONS 04/06/2018 Recommended Consults -- Oral Care Recommendations Oral care BID Other Recommendations Order thickener from pharmacy   CHL IP FOLLOW UP RECOMMENDATIONS 04/06/2018 Follow up Recommendations Inpatient Rehab   CHL IP FREQUENCY AND DURATION 04/06/2018 Speech Therapy Frequency (ACUTE ONLY) min 2x/week Treatment Duration 2 weeks      CHL IP ORAL PHASE 04/06/2018 Oral Phase Impaired Oral - Pudding Teaspoon -- Oral - Pudding Cup -- Oral - Honey Teaspoon WFL Oral - Honey Cup -- Oral - Nectar Teaspoon WFL Oral - Nectar Cup WFL Oral - Nectar Straw WFL Oral - Thin Teaspoon -- Oral - Thin Cup WFL Oral - Thin Straw WFL Oral - Puree WFL Oral - Mech Soft WFL Oral - Regular -- Oral - Multi-Consistency -- Oral - Pill -- Oral Phase - Comment --  CHL IP PHARYNGEAL PHASE 04/06/2018 Pharyngeal Phase Impaired Pharyngeal- Pudding Teaspoon -- Pharyngeal -- Pharyngeal- Pudding Cup -- Pharyngeal -- Pharyngeal- Honey Teaspoon Delayed swallow initiation-vallecula;Reduced airway/laryngeal closure;Penetration/Aspiration during swallow;Trace aspiration Pharyngeal Material  enters airway, passes BELOW cords without attempt by patient to eject out (silent aspiration);Material does not enter airway Pharyngeal- Honey Cup Penetration/Aspiration during swallow;Reduced airway/laryngeal closure;Trace aspiration Pharyngeal Material enters airway, passes BELOW cords without attempt by patient to eject out (silent aspiration) Pharyngeal- Nectar Teaspoon Penetration/Aspiration before swallow;Penetration/Aspiration during swallow;Reduced airway/laryngeal closure;Delayed swallow initiation-pyriform sinuses;Moderate aspiration Pharyngeal Material enters airway, passes BELOW cords and not ejected out despite cough attempt by patient;Material enters airway, passes BELOW cords then ejected out Pharyngeal- Nectar Cup Penetration/Aspiration before swallow;Penetration/Aspiration during swallow;Reduced airway/laryngeal closure;Delayed swallow initiation-pyriform sinuses;Moderate aspiration;Compensatory strategies attempted (with notebox) Pharyngeal Material enters airway, passes BELOW cords and not ejected out despite cough attempt by patient;Material does not enter airway Pharyngeal- Nectar Straw Penetration/Aspiration before swallow;Penetration/Aspiration during swallow;Reduced airway/laryngeal closure;Delayed swallow initiation-pyriform sinuses;Moderate aspiration Pharyngeal Material enters airway, passes BELOW cords and not ejected out despite cough attempt by patient;Material does not enter airway Pharyngeal- Thin Teaspoon -- Pharyngeal -- Pharyngeal- Thin Cup Penetration/Aspiration before swallow;Penetration/Aspiration during swallow;Reduced airway/laryngeal closure;Delayed swallow initiation-pyriform sinuses;Moderate aspiration Pharyngeal Material enters airway, passes BELOW cords and not ejected out despite cough attempt by patient;Material does not enter airway Pharyngeal- Thin Straw Penetration/Aspiration before swallow;Penetration/Aspiration during swallow;Reduced airway/laryngeal  closure;Delayed swallow initiation-pyriform sinuses;Moderate aspiration Pharyngeal Material enters airway, passes BELOW cords and not ejected out despite cough attempt by patient;Material does not enter airway;Material enters airway, passes BELOW cords without attempt by patient to eject out (silent aspiration)  Pharyngeal- Puree Delayed swallow initiation-vallecula;Pharyngeal residue - valleculae Pharyngeal -- Pharyngeal- Mechanical Soft Pharyngeal residue - valleculae;Delayed swallow initiation-vallecula Pharyngeal -- Pharyngeal- Regular -- Pharyngeal -- Pharyngeal- Multi-consistency -- Pharyngeal -- Pharyngeal- Pill -- Pharyngeal -- Pharyngeal Comment --  No flowsheet data found. Herbie Baltimore, MA CCC-SLP Acute Rehabilitation Services Pager [phone] Office [phone] Lynann [name] 04/07/2018, 10:11 AM              Recent Labs    04/06/18 0612  WBC 7.2  HGB 14.3  HCT 41.3  PLT 146*   Recent Labs    04/06/18 0612 04/08/18 0536  NA 135 137  K 3.9 3.7  CL 107 105  CO2 21* 24  GLUCOSE 98 108*  BUN 22 15  CREATININE 1.25* 1.29*  CALCIUM 9.2 9.2   No intake or output data in the 24 hours ending 04/08/18 0758   Physical Exam: Vital Signs Blood pressure (!) 158/72, pulse 66, temperature 98.3 F (36.8 C), temperature source Oral, resp. rate 18, height 5' 9.5" (1.765 m), weight 76.2 kg, SpO2 96 %.  Physical Exam Constitutional:No distress.  HENT:  Head:Normocephalicand atraumatic.  Eyes:Pupils are equal, round, and reactive to light.  Neck:No tracheal deviationpresent. No thyromegalypresent.  Cardiovascular:Normal rateand regular rhythm. Exam revealsno friction rub. Murmurheard. Respiratory:Effort normal. Norespiratory distress. He hasno wheezes. He hasno rales.  QQ:PYPP.Bowel sounds are normal. He exhibitsno distension. There isno abdominal tenderness.  Musculoskeletal:Normal range of motion.  Neurological: Patient is alert sitting up in bed  Makes good eye contact with examiner. Left central 7. Speech is dysarthric but intelligible. He provides his name and age, oriented to place. Fair insight and awareness. . LUE: 2+ delt and biceps, 2 triceps, 2- WE, 0 HI. LLE: 2+ HF, KE and 3/5 ADF/PF. RUE and RLE grossly 4 to 5/5. Sensory 1+/2 LUE and LLE..  Skin: Skin iswarm. He isnot diaphoretic.  Psychiatric: He has anormal mood and affect. Hisbehavior is normal.   Assessment/Plan: 1. Functional deficits secondary to RIght BG ICH which require 3+ hours per day of interdisciplinary therapy in a comprehensive inpatient rehab setting.  Physiatrist is providing close team supervision and 24 hour management of active medical problems listed below.  Physiatrist and rehab team continue to assess barriers to discharge/monitor patient progress toward functional and medical goals  Care Tool:  Bathing              Bathing assist       Upper Body Dressing/Undressing Upper body dressing   What is the patient wearing?: Hospital gown only    Upper body assist Assist Level: Maximal Assistance - Patient 25 - 49%    Lower Body Dressing/Undressing Lower body dressing      What is the patient wearing?: Incontinence brief     Lower body assist Assist for lower body dressing: Total Assistance - Patient < 25%     Toileting Toileting    Toileting assist Assist for toileting: Maximal Assistance - Patient 25 - 49%     Transfers Chair/bed transfer  Transfers assist  Chair/bed transfer activity did not occur: Safety/medical concerns        Locomotion Ambulation   Ambulation assist              Walk 10 feet activity   Assist           Walk 50 feet activity   Assist           Walk 150 feet activity   Assist  Walk 10 feet on uneven surface  activity   Assist           Wheelchair     Assist               Wheelchair 50 feet with 2 turns activity    Assist             Wheelchair 150 feet activity     Assist          Medical Problem List and Plan: 1.  Left side weakness with dysarthria and dysphagia secondary to acute right basal ganglia hemorrhage secondary to hypertensive crisis             -CIR PT, OT,SLP evals today 2. DVT Prophylaxis/Anticoagulation: SCDs 3. Pain Management:  Tylenol as needed 4. Mood:  Provide emotional support 5. Neuropsych: This patient is capable of making decisions on his own behalf. 6. Skin/Wound Care:  Routine skin checks 7. Fluids/Electrolytes/Nutrition:  Routine in and out's with follow-up chemistries upon admit 8. Dysphagia. Dysphagia #3 neck thick liquids. Follow-up speech therapy Coughing related to this, no fever- will have difficultly with left head turn due to neck pain 9. Hypertension. Lisinopril 2.5 mg daily. Monitor with increased mobility Vitals:   04/08/18 0530 04/08/18 0751  BP: (!) 161/70 (!) 158/72  Pulse: 66   Resp: 18   Temp: 98.3 F (36.8 C)   SpO2: 62%   systolic HTN may increase lisinopril f/u creat stable 10. History of gout. Allopurinol 300 mg daily. 11. History of CAD with CABG. No chest pain or shortness of breath 12. CKD stage III. Baseline creatinine 1.40- at/ below baseline     LOS: 1 days A FACE TO FACE EVALUATION WAS PERFORMED  Charlett Blake 04/08/2018, 7:58 AM

## 2018-04-08 NOTE — Progress Notes (Signed)
Social Work  Social Work Assessment and Plan  Patient Details  Name: Jimmy [name] MRN: [id number] Date of Birth: Aug 14, 1928  Today's Date: 04/08/2018  Problem List:  Patient Active Problem List   Diagnosis Date Noted  . Dysphagia 04/07/2018  . Family history of stroke 04/07/2018  . IVH (intraventricular hemorrhage) ([place]) 04/07/2018  . ICH (intracerebral hemorrhage) ([place]) 04/04/2018  . MCI (mild cognitive impairment) with memory loss 06/17/2017  . Dizziness 06/17/2017  . Diarrhea 10/18/2016  . TIA (transient ischemic attack) 04/08/2016  . Thrombocytopenia ([place]) 04/08/2016  . Leg weakness 11/11/2014  . Advanced care planning/counseling discussion 09/16/2014  . Nocturia 09/16/2014  . Mass of submandibular region   . History of CVA (cerebrovascular accident)   . Chest pressure 11/19/2013  . S/P CABG (coronary artery bypass graft) 11/19/2013  . Chronic kidney disease, stage 3 ([place])   . Aortic stenosis   . Medicare annual wellness visit, subsequent 08/27/2012  . Vitamin D deficiency 08/17/2012  . Dyspnea 06/26/2011  . Erectile dysfunction 09/18/2010  . Carotid artery stenosis 12/01/2009  . UNSPECIFIED SUBJECTIVE VISUAL DISTURBANCE 05/01/2009  . BACK PAIN, LUMBAR 10/24/2008  . Chronic lymphocytic leukemia, Rai stage 0 ([place]) 04/02/2007    Class: Chronic  . DERMATITIS, SEBORRHEIC NOS 12/16/2006  . GENITAL HERPES 12/04/2006  . Mixed hyperlipidemia 12/04/2006  . Essential hypertension 12/04/2006  . Coronary atherosclerosis 12/04/2006  . GERD 12/04/2006  . Prediabetes 12/04/2006  . RENAL CALCULUS, HX OF 12/04/2006   Past Medical History:  Past Medical History:  Diagnosis Date  . CAD (coronary artery disease)    a. 04/2001 S/P CABGx 4;  b. 2008 MV:  EF 63% normal perfusion.  . Carotid stenosis    a. 11/2012 Carotid U/S:  RICA 71-69%, LICA 67-89%.  . Chronic kidney disease, stage 3 ([place])   . CLL (chronic lymphoblastic leukemia) 2009   Stage IV; Dr. Ivor Messier - referred to  Dr. Lissa Merlin [facility] 07/2013 now on Rituxan (10/2013) --> stage 0 (09/2014)  . Diastolic CHF ([place])    a. 05/8099 Echo: EF 55-65%, mild conc LVH, Gr 1 DD, mild AS, triv AI, mildly dil Ao root (3.5 cm).  . GERD (gastroesophageal reflux disease) 1990s  . History of CVA (cerebrovascular accident) 2015   by MRI - remote L internal capsule  . History of herpes genitalis    valtrex daily  . Hyperlipemia 2002  . Hypertension 2002  . Mass of submandibular region 2015   referred to gen surg after chemo  . Mild aortic stenosis    a. 07/2011 Echo: Mild AS, triv AI.  [name] Stroke ([place])   . Thrombocytopenia ([place])    outpatient goals Hgb >9, plt >20  . TIA (transient ischemic attack)    04/2016   Past Surgical History:  Past Surgical History:  Procedure Laterality Date  . ANGIOPLASTY  1998  . CATARACT EXTRACTION, BILATERAL     R 1/09, L 8/09  . COLONOSCOPY  11/29/1987   Normal  . COLONOSCOPY  02/07/2003   Hemm. Internal, focal proctitis, negative biopsy  . COLONOSCOPY  12/12/2004   Internal external hemorrhoids, +proctits, negative biopsy  . CORONARY ANGIOPLASTY  1998  . CORONARY ARTERY BYPASS GRAFT  04/23/2001   x4, Dr. Pia Mau  . ESOPHAGOGASTRODUODENOSCOPY  11/29/1987   Gastritis  . ETT  12/10/2006   Persantine myoview nml  . HEMORROIDECTOMY  1954   Fissure repair, [place]  . HEPATIC ARTERY ANGIOPLASTY  1954   [address], [place]  [address]   Dr Redmond Baseman  . LITHOTRIPSY  1990s   Multiple  . US ECHOCARDIOGRAPHY  07/2011   nl sys fxn, EF 55-60%, grade 1 diast dysfunction, mild AS, mildly elevated PA pressure   Social History:  reports that he quit smoking about 39 years ago. He has a 60.00 pack-year smoking history. He has never used smokeless tobacco. He reports that he does not drink alcohol or use drugs.  Family / Support Systems Marital Status: Married How Long?: 40 years Patient Roles: Spouse, Parent, Other (Comment)(Church member) Spouse/Significant Other: [name]  [phone] Children: Sandra-daughter [phone] Other Supports: grandchildren and church members Anticipated Caregiver: Wife and daughter and wife's sister's Ability/Limitations of Caregiver: Wife can only provide supervision level. Need to await his goals to establish a plan Caregiver Availability: 24/7 Family Dynamics: Close knit with family and extended family, he has always been independent and able to assist others and does not like to rely upon others or ask for assist.  Social History Preferred language: English Religion: None Cultural Background: No issues Education: High School Read: Yes Write: Yes Employment Status: Retired Public relations account executive Issues: No issues Guardian/Conservator: None-according to MD pt is capable of making his own decisions while here, wife or family member is always here with him.   Abuse/Neglect Abuse/Neglect Assessment Can Be Completed: Yes Physical Abuse: Denies Verbal Abuse: Denies Sexual Abuse: Denies Exploitation of patient/patient's resources: Denies Self-Neglect: Denies  Emotional Status Pt's affect, behavior and adjustment status: Pt is motivated to regain his independence, he is not one to ask for help from others. He would go to the gym daily and exercise to keep himself in shape. His wife reports he was always moving, not one to sit still. Recent Psychosocial Issues: has had other health issues but has done well and remained independent Psychiatric History: No history deferred depression screen due to pt coping appropriately and can verbalize his concerns. He relies upon his strong faith to cope and feels when you get ot be his age you never know what will happen, you have to preapre for anything. Substance Abuse History: No issues  Patient / Family Perceptions, Expectations & Goals Pt/Family understanding of illness & functional limitations: Pt and wife talk with the MD and feel they have a good understanding of his stroke  and deficits. He is encouraged by the progress he has made and hopeful this will continue. He is not one to shy away form the hard questions to the MD. 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."  [employer]: Other (Comment)(Cardiac rehab 2001) Premorbid Home Care/DME Agencies: Other (Comment)(has RW and tub seat) Transportation available at discharge: Wife and family Resource referrals recommended: Support group (specify)  Discharge Planning Living Arrangements: Spouse/significant other Support Systems: Spouse/significant other, Children, Other relatives, Friends/neighbors, Church/faith community Type of Residence: Private residence Insurance Resources: [employer], Multimedia programmer (specify)(tricare) Museum/gallery curator Resources: Fish farm manager, Other (Comment)(pension) Financial Screen Referred: No Living Expenses: Own Money Management: Patient, Spouse Does the patient have any problems obtaining your medications?: No Home Management: Wife pt did outside work Patient/Family Preliminary Plans: Return home with wife and other family members assisting if needed. Discussed team conference today but will be evaluating and setting goals today  will probably not be able to give them a target discharge date. Pt reports the first therapist feels he will be here 2-3 weeks.  Sw Barriers to Discharge: Decreased caregiver support Sw Barriers to Discharge Comments: Wife can only do supervision level Social Work Anticipated Follow Up Needs: HH/OP, Support Group  Clinical Impression Very active gentleman who is one who is used to being independent. He has been relatively  healthy throughout his life and hopes to get back to this level. His wife is involved and willing to assist along with her sister's and pt's daughter. He appears to be coping appropriately and neuro-psych services are not needed at this time, but will monitor while here. Will work on a safe discharge plan for them await therapy evaluations.  Elease Hashimoto 04/08/2018, 9:26 AM

## 2018-04-08 NOTE — Progress Notes (Signed)
Retta Diones, RN  Rehab Admission Coordinator  Physical Medicine and Rehabilitation  PMR Pre-admission  Signed  Date of Service:  04/07/2018 11:54 AM       Related encounter: ED to Hosp-Admission (Discharged) from 04/04/2018 in [facility]      Signed         Show:Clear all [x] Manual[x] Template[x] Copied  Added by: [x] Karl Bales Evalee Mutton, RN  [] Hover for details PMR Admission Coordinator Pre-Admission Assessment  Patient: Jimmy [name] is an 85 y.o., male MRN: [id number] DOB: 1928-07-07 Height: 5\' 10"  (177.8 cm) Weight: 75.8 kg                                                                                                                                                  Insurance Information HMO: No    PPO:       PCP:       IPA:       80/20:       OTHER:   PRIMARY:  Medicare A and B      Policy#: [id number]      Subscriber: patient CM Name:        Phone#:       Fax#:   Pre-Cert#:        Employer: Retired Benefits:  Phone #:       Name: Checked in [place] [employer] [place]. Date: A=03/01/96 and B=04/01/04     Deduct: $1408      Out of Pocket Max: none      Life Max: N/A CIR: 100%      SNF: 100 days Outpatient: 80%     Co-Pay: 20% Home Health: 100$      Co-Pay: none DME: 80%     Co-Pay: 20% Providers: patient's choice  SECONDARY: Tricare for Life      Policy#: [id number]      Subscriber: patient CM Name:        Phone#:       Fax#:   Pre-Cert#:        Employer: Retired Benefits:  Phone #: [phone]     Name:   Eff. Date:       Deduct:        Out of Pocket Max:        Life Max:   CIR:        SNF:   Outpatient:       Co-Pay:   Home Health:        Co-Pay:   DME:       Co-Pay:    Medicaid Application Date:        Case Manager:   Disability Application Date:        Case Worker:    [employer] copy Information    Name Relation Home  Work Mobile   Leflore,[name] M Spouse   [phone]   Gabreil, [place] Daughter    [phone]     Current Medical History  Patient Admitting Diagnosis: R BG hemorrhage  History of Present Illness: An 85 year old right-handed male with history of CAD with CABG, CLL,CKD stage III, diastolic congestive heart failure, hypertension, CVA 2 years ago maintained on Aggrenox. Per chart review patient lives with spouse. Reportedly independently goes to the gym every morning. One level home with 4 steps to entry. Presented 04/04/2018 with sudden onset of left-sided weakness and dysarthria. Blood pressure 152/75. Cranial CT scan showed an 8.8 mL acute intraparenchymal hematoma centered at the posterior right lentiform nucleus without significant regional mass effect.MRA negative.. Initially placed on Cardene drip for blood pressure control. Dysphagia #3 nectar thick liquids. Therapy evaluations completed with recommendations of physical medicine rehabilitation consult. Patient was admitted for a compress of rehabilitation program.    Complete NIHSS TOTAL: 8  Past Medical History      Past Medical History:  Diagnosis Date  . CAD (coronary artery disease)    a. 04/2001 S/P CABGx 4;  b. 2008 MV:  EF 63% normal perfusion.  . Carotid stenosis    a. 11/2012 Carotid U/S:  RICA 61-60%, LICA 73-71%.  . Chronic kidney disease, stage 3 ([place])   . CLL (chronic lymphoblastic leukemia) 2009   Stage IV; Dr. Ivor Messier - referred to Dr. Lissa Merlin [facility] 07/2013 now on Rituxan (10/2013) --> stage 0 (09/2014)  . Diastolic CHF ([place])    a. 0/6269 Echo: EF 55-65%, mild conc LVH, Gr 1 DD, mild AS, triv AI, mildly dil Ao root (3.5 cm).  . GERD (gastroesophageal reflux disease) 1990s  . History of CVA (cerebrovascular accident) 2015   by MRI - remote L internal capsule  . History of herpes genitalis    valtrex daily  . Hyperlipemia 2002  . Hypertension 2002  . Mass of submandibular region 2015   referred to gen surg after chemo  . Mild aortic stenosis    a. 07/2011 Echo: Mild AS, triv  AI.  [name] Stroke ([place])   . Thrombocytopenia ([name])    outpatient goals Hgb >9, plt >20  . TIA (transient ischemic attack)    04/2016    Family History  family history includes Alcohol abuse in his brother; Depression in his daughter; Diabetes in his brother and another family member; Heart attack in his brother; Heart disease in his mother; Hyperlipidemia in his father and mother; Hypertension in his father and mother; Kidney cancer in his sister; Stroke in his brother.  Prior Rehab/Hospitalizations: Was hospitalized 2 years ago after a TIA.  Had cardiac rehab in 2001, but no rehab since that time.  Has the patient had major surgery during 100 days prior to admission? No  Current Medications   Current Facility-Administered Medications:  .   stroke: mapping our early stages of recovery book, , Does not apply, Once, Kerney Elbe, MD .  0.9 %  sodium chloride infusion, 500 mL, Intravenous, Continuous, Garvin Fila, MD, Last Rate: 75 mL/hr at 04/07/18 0702, 500 mL at 04/07/18 0702 .  acetaminophen (TYLENOL) tablet 650 mg, 650 mg, Oral, Q4H PRN **OR** acetaminophen (TYLENOL) solution 650 mg, 650 mg, Per Tube, Q4H PRN **OR** acetaminophen (TYLENOL) suppository 650 mg, 650 mg, Rectal, Q4H PRN, Kerney Elbe, MD .  allopurinol (ZYLOPRIM) tablet 300 mg, 300 mg, Oral, Daily, Kerney Elbe, MD, 300 mg at 04/07/18 1129 .  [COMPLETED] labetalol (NORMODYNE,TRANDATE) injection 20 mg,  20 mg, Intravenous, Once, 20 mg at 04/04/18 0326 **AND** clevidipine (CLEVIPREX) infusion 0.5 mg/mL, 0-21 mg/hr, Intravenous, Continuous, Biby, Massie Kluver, NP, Stopped at 04/06/18 1200 .  labetalol (NORMODYNE,TRANDATE) injection 10 mg, 10 mg, Intravenous, Q2H PRN, Biby, Sharon L, NP, 10 mg at 04/06/18 1631 .  lisinopril (PRINIVIL,ZESTRIL) tablet 2.5 mg, 2.5 mg, Oral, Daily, Kerney Elbe, MD, 2.5 mg at 04/07/18 1130 .  pantoprazole (PROTONIX) injection 40 mg, 40 mg, Intravenous, QHS, Kerney Elbe, MD, 40 mg at  04/06/18 2213 .  RESOURCE THICKENUP CLEAR, , Oral, PRN, Garvin Fila, MD .  senna-docusate (Senokot-S) tablet 1 tablet, 1 tablet, Oral, BID, Kerney Elbe, MD, 1 tablet at 04/07/18 1130 .  valACYclovir (VALTREX) tablet 500 mg, 500 mg, Oral, Daily, Kerney Elbe, MD, 500 mg at 04/07/18 1131 .  vitamin B-12 (CYANOCOBALAMIN) tablet 1,000 mcg, 1,000 mcg, Oral, Daily, Kerney Elbe, MD, 1,000 mcg at 04/07/18 1131  Patients Current Diet:     Diet Order                  DIET DYS 3 Room service appropriate? Yes; Fluid consistency: Nectar Thick  Diet effective now               Precautions / Restrictions Precautions Precautions: Fall Precaution Comments: left hemiparesis, left inattention Restrictions Weight Bearing Restrictions: No   Has the patient had 2 or more falls or a fall with injury in the past year?No.  Patient reports 1 fall in the past year.  Prior Activity Level Community (5-7x/wk): Went to the gym daily, was driving.  Home Assistive Devices / Equipment Home Assistive Devices/Equipment: Eyeglasses, Grab bars around toilet, Grab bars in shower, Hearing aid Home Equipment: Walker - 2 wheels, Shower seat, Hand held shower head  Prior Device Use: Indicate devices/aids used by the patient prior to current illness, exacerbation or injury? None  Prior Functional Level Prior Function Level of Independence: Independent Comments: goes to gym every morning  Self Care: Did the patient need help bathing, dressing, using the toilet or eating?  Independent  Indoor Mobility: Did the patient need assistance with walking from room to room (with or without device)? Independent  Stairs: Did the patient need assistance with internal or external stairs (with or without device)? Independent  Functional Cognition: Did the patient need help planning regular tasks such as shopping or remembering to take medications? Independent  Current Functional Level Cognition   Overall Cognitive Status: Impaired/Different from baseline Current Attention Level: Selective Orientation Level: Oriented X4 Following Commands: Follows one step commands consistently Safety/Judgement: Decreased awareness of safety, Decreased awareness of deficits General Comments: Lt inattention    Extremity Assessment (includes Sensation/Coordination)  Upper Extremity Assessment: LUE deficits/detail LUE Deficits / Details: limited AROM proximally at shoulder, elbow grossly 2-/5; 0/5 distal to elbow; unable to use functionally LUE Sensation: decreased light touch, decreased proprioception LUE Coordination: decreased fine motor, decreased gross motor  Lower Extremity Assessment: Defer to PT evaluation LLE Deficits / Details: grossly 2+/5    ADLs  Overall ADL's : Needs assistance/impaired Eating/Feeding: NPO Grooming: Moderate assistance, Wash/dry hands, Wash/dry face, Sitting Grooming Details (indicate cue type and reason): hand over hand support to wash R hand  Upper Body Bathing: Moderate assistance, Sitting Upper Body Bathing Details (indicate cue type and reason): hand over hand assist to wash R UE with L  Lower Body Bathing: +2 for physical assistance, Maximal assistance, Sit to/from stand Upper Body Dressing : Maximal assistance, Sitting Lower Body Dressing: Maximal assistance, +2  for physical assistance, Sit to/from stand Toilet Transfer: Minimal assistance, +2 for physical assistance, Ambulation Toilet Transfer Details (indicate cue type and reason): simulated to recliner  Functional mobility during ADLs: Minimal assistance, +2 for safety/equipment    Mobility  Overal bed mobility: Needs Assistance Bed Mobility: Rolling, Sidelying to Sit Rolling: Min assist Sidelying to sit: Min assist Supine to sit: Mod assist General bed mobility comments: cues for sequence with assist to direct attention and neck rotation to left, reach for rail and bend RLE hooking LLE to bring  legs off bed.     Transfers  Overall transfer level: Needs assistance Equipment used: 1 person hand held assist, 2 person hand held assist Transfer via [place]: Stedy Transfers: Sit to/from [employer] to Stand: Min assist, +2 safety/equipment General transfer comment: min assist to rise from bed and chair with increased time. cues for hand placement. Mod assist once standing for balance and weight shift    Ambulation / Gait / Stairs / Wheelchair Mobility  Ambulation/Gait Ambulation/Gait assistance: Mod assist, +2 safety/equipment Gait Distance (Feet): 5 Feet Assistive device: 1 person hand held assist Gait Pattern/deviations: Step-to pattern, Narrow base of support General Gait Details: pt facing therapist with RUE on therapist arm and LUE tucked in therapist axilla pt with assist and cues to weight shift right with pt able to advance LLE with cueing. Pt with difficulty moving LLE with turning and required assist to position leg. 2 trials of grossly 5' each with chair follow and 2 person assist for lines, chair, safety and positioning Gait velocity interpretation: <1.31 ft/sec, indicative of household ambulator    Posture / Balance Dynamic Sitting Balance Sitting balance - Comments: EOB 5 min with pt able to progress from mod assist to minguard for short periods with cues for posture and position with bil UE in lap. Pt performed propping on LUE x 5 with assist to transition back to midline Balance Overall balance assessment: Needs assistance Sitting-balance support: Bilateral upper extremity supported, Feet supported Sitting balance-Leahy Scale: Poor Sitting balance - Comments: EOB 5 min with pt able to progress from mod assist to minguard for short periods with cues for posture and position with bil UE in lap. Pt performed propping on LUE x 5 with assist to transition back to midline Postural control: Left lateral lean, Posterior lean Standing balance support: Bilateral upper  extremity supported Standing balance-Leahy Scale: Poor Standing balance comment: mod assist for standing balance    Special needs/care consideration BiPAP/CPAP No CPM No Continuous Drip IV 0.9% NS at 75 mL/hr Dialysis No       Life Vest No Oxygen No Special Bed No Trach Size No Wound Vac (area) No Skin H/O skin cancers removed from neck, head, face and ears in the past                        Bowel mgmt: Last BM 04/07/18 Bladder mgmt: External catheter for incontinence Diabetic mgmt No    Previous Home Environment Living Arrangements: Spouse/significant other Available Help at Discharge: Family Type of Home: House Home Layout: One level Home Access: Stairs to enter Entrance Stairs-Rails: Right, Left Entrance Stairs-Number of Steps: 4 in front and in garage, ramp on back Bathroom Shower/Tub: Multimedia programmer: Standard Home Care Services: No  Discharge Living Setting Plans for Discharge Living Setting: House, Lives with (comment)(Lives with wife.) Type of Home at Discharge: House Discharge Home Layout: One level Discharge Home Access: Stairs to enter  Entrance Stairs-Number of Steps: 5 steps entry Discharge Bathroom Shower/Tub: Walk-in shower, Door Discharge Bathroom Toilet: Handicapped height Discharge Bathroom Accessibility: Yes How Accessible: Accessible via walker Does the patient have any problems obtaining your medications?: No  Social/Family/Support Systems Patient Roles: Spouse, Parent, Other (Comment)(Has a wife, daughter and sisters in law.)  Son passed away from Lung cancer. Contact Information: Carnie Bruemmer - wife Anticipated Caregiver: Wife, daughter, sisters in law Anticipated Caregiver's Contact Information: [name] - wife - [phone] Ability/Limitations of Caregiver: Wife can provide supervision.  Others can assist as needed. Caregiver Availability: 24/7 Discharge Plan Discussed with Primary Caregiver: Yes Is Caregiver In Agreement with  Plan?: Yes Does Caregiver/Family have Issues with Lodging/Transportation while Pt is in Rehab?: No  Goals/Additional Needs Patient/Family Goal for Rehab: PT/OT/SLP supervision goals Expected length of stay: 14-18 days Cultural Considerations: [employer] Dietary Needs: Dys 3, nectar thick liquids Equipment Needs: TBD Pt/Family Agrees to Admission and willing to participate: Yes Program Orientation Provided & Reviewed with Pt/Caregiver Including Roles  & Responsibilities: Yes  Decrease burden of Care through IP rehab admission: N/A  Possible need for SNF placement upon discharge: Not anticipated  Patient Condition: This patient's medical and functional status has changed since the consult dated: 04/05/18 in which the Rehabilitation Physician determined and documented that the patient's condition is appropriate for intensive rehabilitative care in an inpatient rehabilitation facility. See "History of Present Illness" (above) for medical update. Functional changes are: Currently requiring mod assist +2 to ambulate 5 feet +1 HHA. Patient's medical and functional status update has been discussed with the Rehabilitation physician and patient remains appropriate for inpatient rehabilitation. Will admit to inpatient rehab today.  Preadmission Screen Completed By:  Retta Diones, 04/07/2018 12:04 PM ______________________________________________________________________   Discussed status with Dr. Naaman Plummer on 04/07/18 at 1204 and received telephone approval for admission today.  Admission Coordinator:  Retta Diones, time 1204/Date 04/07/18           Cosigned by: Meredith Staggers, MD at 04/07/2018 1:22 PM  Revision History

## 2018-04-08 NOTE — Evaluation (Signed)
Occupational Therapy Assessment and Plan  Patient Details  Name: Jimmy [name] MRN: [id number] Date of Birth: 01-07-1929  OT Diagnosis: abnormal posture, cognitive deficits, disturbance of vision and hemiplegia affecting non-dominant side Rehab Potential: Rehab Potential (ACUTE ONLY): Excellent ELOS: 21-23 days   Today's Date: 04/08/2018 OT Individual Time: 0930-1030 OT Individual Time Calculation (min): 60 min     Problem List:  Patient Active Problem List   Diagnosis Date Noted  . Dysphagia 04/07/2018  . Family history of stroke 04/07/2018  . IVH (intraventricular hemorrhage) ([place]) 04/07/2018  . ICH (intracerebral hemorrhage) ([place]) 04/04/2018  . MCI (mild cognitive impairment) with memory loss 06/17/2017  . Dizziness 06/17/2017  . Diarrhea 10/18/2016  . TIA (transient ischemic attack) 04/08/2016  . Thrombocytopenia ([place]) 04/08/2016  . Leg weakness 11/11/2014  . Advanced care planning/counseling discussion 09/16/2014  . Nocturia 09/16/2014  . Mass of submandibular region   . History of CVA (cerebrovascular accident)   . Chest pressure 11/19/2013  . S/P CABG (coronary artery bypass graft) 11/19/2013  . Chronic kidney disease, stage 3 ([place])   . Aortic stenosis   . Medicare annual wellness visit, subsequent 08/27/2012  . Vitamin D deficiency 08/17/2012  . Dyspnea 06/26/2011  . Erectile dysfunction 09/18/2010  . Carotid artery stenosis 12/01/2009  . UNSPECIFIED SUBJECTIVE VISUAL DISTURBANCE 05/01/2009  . BACK PAIN, LUMBAR 10/24/2008  . Chronic lymphocytic leukemia, Rai stage 0 ([place]) 04/02/2007    Class: Chronic  . DERMATITIS, SEBORRHEIC NOS 12/16/2006  . GENITAL HERPES 12/04/2006  . Mixed hyperlipidemia 12/04/2006  . Essential hypertension 12/04/2006  . Coronary atherosclerosis 12/04/2006  . GERD 12/04/2006  . Prediabetes 12/04/2006  . RENAL CALCULUS, HX OF 12/04/2006    Past Medical History:  Past Medical History:  Diagnosis Date  . CAD (coronary artery disease)     a. 04/2001 S/P CABGx 4;  b. 2008 MV:  EF 63% normal perfusion.  . Carotid stenosis    a. 11/2012 Carotid U/S:  RICA 97-98%, LICA 92-11%.  . Chronic kidney disease, stage 3 ([place])   . CLL (chronic lymphoblastic leukemia) 2009   Stage IV; Dr. Ivor Messier - referred to Dr. Lissa Merlin [facility] 07/2013 now on Rituxan (10/2013) --> stage 0 (09/2014)  . Diastolic CHF ([place])    a. 11/4172 Echo: EF 55-65%, mild conc LVH, Gr 1 DD, mild AS, triv AI, mildly dil Ao root (3.5 cm).  . GERD (gastroesophageal reflux disease) 1990s  . History of CVA (cerebrovascular accident) 2015   by MRI - remote L internal capsule  . History of herpes genitalis    valtrex daily  . Hyperlipemia 2002  . Hypertension 2002  . Mass of submandibular region 2015   referred to gen surg after chemo  . Mild aortic stenosis    a. 07/2011 Echo: Mild AS, triv AI.  [name] Stroke ([place])   . Thrombocytopenia ([place])    outpatient goals Hgb >9, plt >20  . TIA (transient ischemic attack)    04/2016   Past Surgical History:  Past Surgical History:  Procedure Laterality Date  . ANGIOPLASTY  1998  . CATARACT EXTRACTION, BILATERAL     R 1/09, L 8/09  . COLONOSCOPY  11/29/1987   Normal  . COLONOSCOPY  02/07/2003   Hemm. Internal, focal proctitis, negative biopsy  . COLONOSCOPY  12/12/2004   Internal external hemorrhoids, +proctits, negative biopsy  . CORONARY ANGIOPLASTY  1998  . CORONARY ARTERY BYPASS GRAFT  04/23/2001   x4, Dr. Pia Mau  . ESOPHAGOGASTRODUODENOSCOPY  11/29/1987  Gastritis  . ETT  12/10/2006   Persantine myoview nml  . HEMORROIDECTOMY  1954   Fissure repair, [place]  . HEPATIC ARTERY ANGIOPLASTY  1954   [place], [place]  . KIDNEY STONE SURGERY  1977   Dr Redmond Baseman  . LITHOTRIPSY  1990s   Multiple  . US ECHOCARDIOGRAPHY  07/2011   nl sys fxn, EF 55-60%, grade 1 diast dysfunction, mild AS, mildly elevated PA pressure    Assessment & Plan Clinical Impression: [name] is an 85 year old right-handed male with history of  CAD with CABG, CLL,CKD stage III, diastolic congestive heart failure, hypertension, CVA 2 years ago maintained on Aggrenox. Per chart review patient lives with spouse. Reportedly independently goes to the gym every morning. One level home with 4 steps to entry. Presented 04/04/2018 with sudden onset of left-sided weakness and dysarthria. Blood pressure 152/75. Cranial CT scan showed an 8.8 mL acute intraparenchymal hematoma centered at the posterior right lentiform nucleus without significant regional mass effect.MRA negative.. Initially placed on Cardene drip for blood pressure control. Dysphagia #3 nectar thick liquids. Therapy evaluations completed with recommendations of physical medicine rehabilitation consult. Patient was admitted for a compress of rehabilitation program.    Patient transferred to CIR on 04/07/2018 .    Patient currently requires mod with basic self-care skills secondary to unbalanced muscle activation, decreased visual acuity, decreased visual perceptual skills and decreased visual motor skills, decreased attention to left, decreased memory and decreased sitting balance, decreased standing balance, decreased postural control, hemiplegia and decreased balance strategies.  Prior to hospitalization, patient was very active and I.  Patient will benefit from skilled intervention to increase independence with basic self-care skills prior to discharge home with care partner.  Anticipate patient will require intermittent supervision and follow up home health.  OT - End of Session Endurance Deficit: Yes OT Assessment Rehab Potential (ACUTE ONLY): Excellent OT Patient demonstrates impairments in the following area(s): Balance;Cognition;Endurance;Motor;Perception;Safety;Sensory;Vision OT Basic ADL's Functional Problem(s): Eating;Grooming;Bathing;Dressing;Toileting OT Transfers Functional Problem(s): Toilet;Tub/Shower OT Additional Impairment(s): Fuctional Use of Upper Extremity OT Plan OT  Intensity: Minimum of 1-2 x/day, 45 to 90 minutes OT Frequency: 5 out of 7 days OT Duration/Estimated Length of Stay: 21-23 days OT Treatment/Interventions: Balance/vestibular training;Discharge planning;DME/adaptive equipment instruction;Functional mobility training;Neuromuscular re-education;Psychosocial support;Patient/family education;Self Care/advanced ADL retraining;Therapeutic Activities;Therapeutic Exercise;UE/LE Strength taining/ROM;UE/LE Coordination activities;Visual/perceptual remediation/compensation OT Self Feeding Anticipated Outcome(s): Independent OT Basic Self-Care Anticipated Outcome(s): S OT Toileting Anticipated Outcome(s): S OT Bathroom Transfers Anticipated Outcome(s): S to toilet, CGA to shower stall OT Recommendation Patient destination: Home Follow Up Recommendations: Home health OT Equipment Recommended: Tub/shower seat   Skilled Therapeutic Intervention Pt seen for initial evaluation and ADL training along with education with pt and his spouse on role of OT, POC, goals.  Pt very eager to progress quickly and was able to follow directions well today.  He was able to follow through with cues to correct body alignment.   Pt worked on toilet transfers, bathing on toilet and dressing along with oral care at the sink.  Pt completed session with minimal fatigue.  Resting in wc with chair alarm on and all needs met.  OT Evaluation Precautions/Restrictions  Precautions Precautions: Fall Precaution Comments: left hemiparesis, left inattention Restrictions Weight Bearing Restrictions: No  Pain Pain Assessment Pain Score: 0-No pain Home Living/Prior Functioning Home Living Family/patient expects to be discharged to:: Private residence Living Arrangements: Spouse/significant other Available Help at Discharge: Family Type of Home: House Home Access: Stairs to enter [employer] of Steps: 4  in front and in garage, ramp on back Entrance Stairs-Rails: Right,  Left Home Layout: One level Bathroom Shower/Tub: Gaffer, Door  Lives With: Spouse Prior Function Level of Independence: Independent with gait, Independent with basic ADLs  Able to Take Stairs?: Yes Driving: Yes Vocation: Retired Comments: goes to gym every morning, enjoys reading ADL ADL Eating: Minimal assistance Grooming: Setup, Supervision/safety Upper Body Bathing: Minimal assistance Lower Body Bathing: Moderate assistance Upper Body Dressing: Moderate assistance Lower Body Dressing: Maximal assistance Toileting: Moderate assistance Where Assessed-Toileting: Glass blower/designer: Moderate assistance Toilet Transfer Method: Stand pivot Toilet Transfer Equipment: Grab bars Vision Baseline Vision/History: Wears glasses Wears Glasses: At all times Patient Visual Report: Blurring of vision Eye Alignment: Within Functional Limits Ocular Range of Motion: Within Functional Limits Alignment/Gaze Preference: Within Defined Limits Tracking/Visual Pursuits: Unable to hold eye position out of midline Additional Comments: on L side Perception  Perception: Impaired Comments: mild L inattention Praxis Praxis: Intact Cognition Overall Cognitive Status: Impaired/Different from baseline Arousal/Alertness: Awake/alert Orientation Level: Person;Place;Situation Person: Oriented Place: Oriented Situation: Oriented Year: 2020 Month: January Day of Week: Correct Memory: Impaired Memory Impairment: Retrieval deficit Immediate Memory Recall: Sock;Blue;Bed Memory Recall: Sock;Blue;Bed Memory Recall Sock: Without Cue Memory Recall Blue: Without Cue Memory Recall Bed: With Cue Attention: Sustained;Selective Sustained Attention: Appears intact Selective Attention: Impaired Awareness: Appears intact Sensation Sensation Light Touch: Impaired by gross assessment(intact in LUE, limited in LLE) Hot/Cold: Appears Intact Proprioception: Impaired by gross assessment(intact in  LUE, impaired LLE) Stereognosis: Impaired by gross assessment Coordination Gross Motor Movements are Fluid and Coordinated: No Fine Motor Movements are Fluid and Coordinated: No Coordination and Movement Description: coordination impaired secondary to L hemiparesis Finger Nose Finger Test: unable to test as pt can not lift arm enough for the test Motor  Motor Motor: Hemiplegia Motor - Skilled Clinical Observations: L hemiparesis Mobility  Bed Mobility Bed Mobility: Supine to Sit;Sit to Supine Supine to Sit: Moderate Assistance - Patient 50-74% Sit to Supine: Moderate Assistance - Patient 50-74% Transfers Sit to Stand: Moderate Assistance - Patient 50-74% Stand to Sit: Moderate Assistance - Patient 50-74%  Trunk/Postural Assessment  Cervical Assessment Cervical Assessment: Within Functional Limits Thoracic Assessment Thoracic Assessment: Exceptions to WFL(rounded shoulder) Lumbar Assessment Lumbar Assessment: Exceptions to WFL(posterior pelvic tilt) Postural Control Postural Control: Deficits on evaluation Trunk Control: trunk curvature toward L due to limited strength, pt able to self correct with cues; L lean in sit and stand  Balance Balance Balance Assessed: Yes Static Sitting Balance Static Sitting - Level of Assistance: 5: Stand by assistance Dynamic Sitting Balance Dynamic Sitting - Level of Assistance: 3: Mod assist Sitting balance - Comments: mod A dynamic sit with reaching toward his feet or when weight shifting to his L side Static Standing Balance Static Standing - Level of Assistance: 4: Min assist Dynamic Standing Balance Dynamic Standing - Level of Assistance: 3: Mod assist Extremity/Trunk Assessment RUE Assessment RUE Assessment: Within Functional Limits LUE Assessment LUE Assessment: Exceptions to [facility] Passive Range of Motion (PROM) Comments: WFL Active Range of Motion (AROM) Comments: elbow flexion to 95, trace scapular elevation, trace wrist extension  and finger flexion LUE Body System: Neuro Brunstrum levels for arm and hand: Arm;Hand Brunstrum level for arm: Stage II Synergy is developing Brunstrum level for hand: Stage II Synergy is developing LUE Tone LUE Tone: Mild     Refer to Care Plan for Long Term Goals  Recommendations for other services: None    Discharge Criteria: Patient will be discharged from OT  if patient refuses treatment 3 consecutive times without medical reason, if treatment goals not met, if there is a change in medical status, if patient makes no progress towards goals or if patient is discharged from hospital.  The above assessment, treatment plan, treatment alternatives and goals were discussed and mutually agreed upon: by patient and by family  [place] 04/08/2018, 1:07 PM

## 2018-04-08 NOTE — Progress Notes (Signed)
Occupational Therapy Session Note  Patient Details  Name: Jimmy [name] MRN: [id number] Date of Birth: 1929/01/27  Today's Date: 04/08/2018 OT Individual Time: 1130-1200 OT Individual Time Calculation (min): 30 min    Short Term Goals: Week 1:  OT Short Term Goal 1 (Week 1): Pt will complete toilet transfer with min A. OT Short Term Goal 2 (Week 1): Pt will weight shift to his L side to cleanse self on toilet with CGA. OT Short Term Goal 3 (Week 1): Pt will bathe with min A. OT Short Term Goal 4 (Week 1): Pt will don shirt with min A. OT Short Term Goal 5 (Week 1): Pt will be able to use LUE as a stabilizing A with donning LB clothing.  Skilled Therapeutic Interventions/Progress Updates:  OT intervention with focus on functional transfers, sitting balance, lateral weight shifts, LUE forced use, and activity tolerance to increase independence with BADLs.  Pt requires min a for stand pivot transfers with max verbal cues for sequencing and safety.  Pt with shortened R trunk when sitting statically on mat but able to elongate with activity.  Pt self corrects when mirror placed for visual feedback.  Pt with approx 130 degrees elbow flexion and active scapula protraction/retraction.  Pt with trance finger flexion.  Minimal L fiinger edema noted. Pt remained in w/c with all needs within reach and chair alarm activated.   Therapy Documentation Precautions:  Precautions Precautions: Fall Precaution Comments: left hemiparesis, left inattention Restrictions Weight Bearing Restrictions: No  Pain: Pt c/o "crick" on R side of neck; RN aware, repositioned, soft tissue mobilizations   Therapy/Group: Individual Therapy  [name] 04/08/2018, 2:08 PM

## 2018-04-08 NOTE — Evaluation (Signed)
Physical Therapy Assessment and Plan  Patient Details  Name: Jimmy [name] MRN: [id number] Date of Birth: [birth year]  PT Diagnosis: Abnormal posture, Difficulty walking, Hemiparesis non-dominant and Muscle weakness Rehab Potential: Good ELOS: 14-18 days   Today's Date: 04/08/2018 PT Individual Time: 0800-0900 PT Individual Time Calculation (min): 60 min    Problem List:  Patient Active Problem List   Diagnosis Date Noted  . Dysphagia 04/07/2018  . Family history of stroke 04/07/2018  . IVH (intraventricular hemorrhage) ([place]) 04/07/2018  . ICH (intracerebral hemorrhage) ([place]) 04/04/2018  . MCI (mild cognitive impairment) with memory loss 06/17/2017  . Dizziness 06/17/2017  . Diarrhea 10/18/2016  . TIA (transient ischemic attack) 04/08/2016  . Thrombocytopenia ([place]) 04/08/2016  . Leg weakness 11/11/2014  . Advanced care planning/counseling discussion 09/16/2014  . Nocturia 09/16/2014  . Mass of submandibular region   . History of CVA (cerebrovascular accident)   . Chest pressure 11/19/2013  . S/P CABG (coronary artery bypass graft) 11/19/2013  . Chronic kidney disease, stage 3 ([place])   . Aortic stenosis   . Medicare annual wellness visit, subsequent 08/27/2012  . Vitamin D deficiency 08/17/2012  . Dyspnea 06/26/2011  . Erectile dysfunction 09/18/2010  . Carotid artery stenosis 12/01/2009  . UNSPECIFIED SUBJECTIVE VISUAL DISTURBANCE 05/01/2009  . BACK PAIN, LUMBAR 10/24/2008  . Chronic lymphocytic leukemia, Rai stage 0 ([place]) 04/02/2007    Class: Chronic  . DERMATITIS, SEBORRHEIC NOS 12/16/2006  . GENITAL HERPES 12/04/2006  . Mixed hyperlipidemia 12/04/2006  . Essential hypertension 12/04/2006  . Coronary atherosclerosis 12/04/2006  . GERD 12/04/2006  . Prediabetes 12/04/2006  . RENAL CALCULUS, HX OF 12/04/2006    Past Medical History:  Past Medical History:  Diagnosis Date  . CAD (coronary artery disease)    a. 04/2001 S/P CABGx 4;  b. 2008 MV:  EF 63% normal  perfusion.  . Carotid stenosis    a. 11/2012 Carotid U/S:  RICA 10-62%, LICA 69-48%.  . Chronic kidney disease, stage 3 ([place])   . CLL (chronic lymphoblastic leukemia) 2009   Stage IV; Dr. Ivor Messier - referred to Dr. Lissa Merlin [facility] 07/2013 now on Rituxan (10/2013) --> stage 0 (09/2014)  . Diastolic CHF ([place])    a. 07/4625 Echo: EF 55-65%, mild conc LVH, Gr 1 DD, mild AS, triv AI, mildly dil Ao root (3.5 cm).  . GERD (gastroesophageal reflux disease) 1990s  . History of CVA (cerebrovascular accident) 2015   by MRI - remote L internal capsule  . History of herpes genitalis    valtrex daily  . Hyperlipemia 2002  . Hypertension 2002  . Mass of submandibular region 2015   referred to gen surg after chemo  . Mild aortic stenosis    a. 07/2011 Echo: Mild AS, triv AI.  [name] Stroke ([place])   . Thrombocytopenia ([place])    outpatient goals Hgb >9, plt >20  . TIA (transient ischemic attack)    04/2016   Past Surgical History:  Past Surgical History:  Procedure Laterality Date  . ANGIOPLASTY  1998  . CATARACT EXTRACTION, BILATERAL     R 1/09, L 8/09  . COLONOSCOPY  11/29/1987   Normal  . COLONOSCOPY  02/07/2003   Hemm. Internal, focal proctitis, negative biopsy  . COLONOSCOPY  12/12/2004   Internal external hemorrhoids, +proctits, negative biopsy  . CORONARY ANGIOPLASTY  1998  . CORONARY ARTERY BYPASS GRAFT  04/23/2001   x4, Dr. Pia Mau  . ESOPHAGOGASTRODUODENOSCOPY  11/29/1987   Gastritis  . ETT  12/10/2006  Persantine myoview nml  . HEMORROIDECTOMY  1954   Fissure repair, [place]  . HEPATIC ARTERY ANGIOPLASTY  1954   [place], [place]  . KIDNEY STONE SURGERY  1977   Dr Redmond Baseman  . LITHOTRIPSY  1990s   Multiple  . US ECHOCARDIOGRAPHY  07/2011   nl sys fxn, EF 55-60%, grade 1 diast dysfunction, mild AS, mildly elevated PA pressure    Assessment & Plan Clinical Impression: Patient is a 85 y.o. year old male with history of CAD with CABG, CLL,CKD stage III, diastolic congestive heart failure,  hypertension, CVA 2 years ago maintained on Aggrenox. Per chart review patient lives with spouse. Reportedly independently goes to the gym every morning. One level home with 4 steps to entry. Presented 04/04/2018 with sudden onset of left-sided weakness and dysarthria. Blood pressure 152/75. Cranial CT scan showed an 8.8 mL acute intraparenchymal hematoma centered at the posterior right lentiform nucleus without significant regional mass effect.MRA negative.. Initially placed on Cardene drip for blood pressure control. Dysphagia #3 nectar thick liquids. Therapy evaluations completed with recommendations of physical medicine rehabilitation consult. Patient was admitted for a compress of rehabilitation program.  Patient transferred to CIR on 04/07/2018 .   Patient currently requires mod with mobility secondary to muscle weakness, decreased cardiorespiratoy endurance, impaired timing and sequencing and decreased coordination, decreased attention and decreased awareness and decreased standing balance, decreased postural control, hemiplegia and decreased balance strategies.  Prior to hospitalization, patient was independent  with mobility and lived with Spouse in a House home.  Home access is 4 in front and in garage, ramp on backStairs to enter.  Patient will benefit from skilled PT intervention to maximize safe functional mobility, minimize fall risk and decrease caregiver burden for planned discharge home with 24 hour supervision.  Anticipate patient will benefit from follow up OP at discharge.     Skilled Therapeutic Intervention Evaluation completed (see details above and below) with education on PT POC and goals and individual treatment initiated with focus on transfers, ambulation, balance, attention and safety. Pt supine in bed upon PT arrival, agreeable to therapy tx and denies pain. Pt donned pants while supine in bed, performed bridging to pull over hips, assist to pull L side of pants up. Pt  transferred to sitting EOB with mod assist. Pt performed stand pivot to w/c with mod assist and transported to the gym. Pt ambulated x 25 ft this session using R handrail and mod assist, therapist providing tactile cues for knee extension during L stance phase and verbal cues for step length/attention to L. Pt transported to ortho gym and performed car transfer stand pivot with mod assist. Pt ascended/descended 2 steps this session with RW and mod assist, verbal cues for techniques and manual facilitation to block L knee. Pt ambulated x 15 ft this session with RW and L hand orthosis, mod assist with max verbal cueing for sequencing, attention to left and tactile cues for L knee extension during stance. Pt transported back to room and left seated in w/c with chair alarm set. Pt's wife present in room, educated on expected length of stay and goals.    PT Evaluation Precautions/Restrictions Precautions Precautions: Fall Precaution Comments: left hemiparesis, left inattention Restrictions Weight Bearing Restrictions: No General   Vital SignsTherapy Vitals Temp: 98.3 F (36.8 C) Temp Source: Oral Pulse Rate: 66 Resp: 18 BP: (!) 158/72 Patient Position (if appropriate): Lying Oxygen Therapy SpO2: 96 % O2 Device: Room Air Pain   denies pain Home Living/Prior Functioning  Home Living Available Help at Discharge: Family Type of Home: House Home Access: Stairs to enter [employer] of Steps: 4 in front and in garage, ramp on back Entrance Stairs-Rails: Right;Left Home Layout: One level  Lives With: Spouse Prior Function Level of Independence: Independent with gait;Independent with basic ADLs  Able to Take Stairs?: Yes Vocation: Retired Comments: goes to gym every morning, enjoys reading Cognition Overall Cognitive Status: Impaired/Different from baseline Arousal/Alertness: Awake/alert Orientation Level: Oriented X4 Attention: Sustained Sustained Attention: Appears  intact Memory: Impaired Sensation Sensation Light Touch: Impaired by gross assessment Proprioception: Impaired by gross assessment Additional Comments: diminished sensation L side compared to R Coordination Gross Motor Movements are Fluid and Coordinated: No Fine Motor Movements are Fluid and Coordinated: No Coordination and Movement Description: coordination impaired secondary to L hemiparesis Motor  Motor Motor: Hemiplegia Motor - Skilled Clinical Observations: L hemiparesis  Mobility Bed Mobility Bed Mobility: Supine to Sit;Sit to Supine Supine to Sit: Moderate Assistance - Patient 50-74% Sit to Supine: Moderate Assistance - Patient 50-74% Transfers Transfers: Stand Pivot Transfers;Sit to Stand;Stand to Sit Sit to Stand: Moderate Assistance - Patient 50-74% Stand to Sit: Moderate Assistance - Patient 50-74% Stand Pivot Transfers: Moderate Assistance - Patient 50 - 74% Stand Pivot Transfer Details: Verbal cues for technique;Verbal cues for precautions/safety;Verbal cues for safe use of DME/AE Locomotion  Gait Ambulation: Yes Gait Assistance: Moderate Assistance - Patient 50-74% Gait Distance (Feet): 25 Feet Assistive device: Rolling walker;Other (Comment)(R rail) Gait Assistance Details: Verbal cues for technique;Verbal cues for precautions/safety;Visual cues for safe use of DME/AE;Verbal cues for sequencing Gait Gait: Yes Gait Pattern: Step-to pattern;Decreased stance time - left;Left flexed knee in stance;Poor foot clearance - left Stairs / Additional Locomotion Stairs: Yes Stairs Assistance: Moderate Assistance - Patient 50 - 74% Stair Management Technique: One rail Right Number of Stairs: 2 Height of Stairs: 6  Trunk/Postural Assessment  Cervical Assessment Cervical Assessment: Within Functional Limits Thoracic Assessment Thoracic Assessment: Exceptions to WFL(rounded shoulder) Lumbar Assessment Lumbar Assessment: Exceptions to WFL(posterior pelvic  tilt) Postural Control Postural Control: Deficits on evaluation Trunk Control: impaired  Balance Balance Balance Assessed: Yes Static Sitting Balance Static Sitting - Level of Assistance: 5: Stand by assistance Dynamic Sitting Balance Dynamic Sitting - Level of Assistance: 5: Stand by assistance Static Standing Balance Static Standing - Level of Assistance: 4: Min assist Dynamic Standing Balance Dynamic Standing - Level of Assistance: 3: Mod assist Extremity Assessment  RLE Assessment RLE Assessment: Within Functional Limits LLE Assessment LLE Assessment: Exceptions to [facility] LLE Strength Left Hip Flexion: 2+/5 Left Hip Extension: 2+/5 Left Knee Flexion: 2+/5 Left Knee Extension: 2+/5 Left Ankle Dorsiflexion: 2/5 Left Ankle Plantar Flexion: 2/5    Refer to Care Plan for Long Term Goals  Recommendations for other services: Neuropsych and Therapeutic Recreation  Outing/community reintegration  Discharge Criteria: Patient will be discharged from PT if patient refuses treatment 3 consecutive times without medical reason, if treatment goals not met, if there is a change in medical status, if patient makes no progress towards goals or if patient is discharged from hospital.  The above assessment, treatment plan, treatment alternatives and goals were discussed and mutually agreed upon: by patient and by family  Netta Corrigan, PT, DPT 04/08/2018, 9:24 AM

## 2018-04-08 NOTE — Evaluation (Signed)
Speech Language Pathology Assessment and Plan  Patient Details  Name: Jimmy [name] MRN: [id number] Date of Birth: [birth year]  SLP Diagnosis: Cognitive Impairments;Dysphagia;Dysarthria  Rehab Potential: Excellent ELOS: 21-23 days     Today's Date: 04/08/2018 SLP Individual Time: 1355-1455 SLP Individual Time Calculation (min): 60 min   Problem List:  Patient Active Problem List   Diagnosis Date Noted  . Dysphagia 04/07/2018  . Family history of stroke 04/07/2018  . IVH (intraventricular hemorrhage) ([place]) 04/07/2018  . ICH (intracerebral hemorrhage) ([place]) 04/04/2018  . MCI (mild cognitive impairment) with memory loss 06/17/2017  . Dizziness 06/17/2017  . Diarrhea 10/18/2016  . TIA (transient ischemic attack) 04/08/2016  . Thrombocytopenia ([place]) 04/08/2016  . Leg weakness 11/11/2014  . Advanced care planning/counseling discussion 09/16/2014  . Nocturia 09/16/2014  . Mass of submandibular region   . History of CVA (cerebrovascular accident)   . Chest pressure 11/19/2013  . S/P CABG (coronary artery bypass graft) 11/19/2013  . Chronic kidney disease, stage 3 ([place])   . Aortic stenosis   . Medicare annual wellness visit, subsequent 08/27/2012  . Vitamin D deficiency 08/17/2012  . Dyspnea 06/26/2011  . Erectile dysfunction 09/18/2010  . Carotid artery stenosis 12/01/2009  . UNSPECIFIED SUBJECTIVE VISUAL DISTURBANCE 05/01/2009  . BACK PAIN, LUMBAR 10/24/2008  . Chronic lymphocytic leukemia, Rai stage 0 ([place]) 04/02/2007    Class: Chronic  . DERMATITIS, SEBORRHEIC NOS 12/16/2006  . GENITAL HERPES 12/04/2006  . Mixed hyperlipidemia 12/04/2006  . Essential hypertension 12/04/2006  . Coronary atherosclerosis 12/04/2006  . GERD 12/04/2006  . Prediabetes 12/04/2006  . RENAL CALCULUS, HX OF 12/04/2006   Past Medical History:  Past Medical History:  Diagnosis Date  . CAD (coronary artery disease)    a. 04/2001 S/P CABGx 4;  b. 2008 MV:  EF 63% normal perfusion.  . Carotid  stenosis    a. 11/2012 Carotid U/S:  RICA 70-62%, LICA 37-62%.  . Chronic kidney disease, stage 3 ([place])   . CLL (chronic lymphoblastic leukemia) 2009   Stage IV; Dr. Ivor Messier - referred to Dr. Lissa Merlin [facility] 07/2013 now on Rituxan (10/2013) --> stage 0 (09/2014)  . Diastolic CHF ([place])    a. 10/3149 Echo: EF 55-65%, mild conc LVH, Gr 1 DD, mild AS, triv AI, mildly dil Ao root (3.5 cm).  . GERD (gastroesophageal reflux disease) 1990s  . History of CVA (cerebrovascular accident) 2015   by MRI - remote L internal capsule  . History of herpes genitalis    valtrex daily  . Hyperlipemia 2002  . Hypertension 2002  . Mass of submandibular region 2015   referred to gen surg after chemo  . Mild aortic stenosis    a. 07/2011 Echo: Mild AS, triv AI.  [name] Stroke ([place])   . Thrombocytopenia ([place])    outpatient goals Hgb >9, plt >20  . TIA (transient ischemic attack)    04/2016   Past Surgical History:  Past Surgical History:  Procedure Laterality Date  . ANGIOPLASTY  1998  . CATARACT EXTRACTION, BILATERAL     R 1/09, L 8/09  . COLONOSCOPY  11/29/1987   Normal  . COLONOSCOPY  02/07/2003   Hemm. Internal, focal proctitis, negative biopsy  . COLONOSCOPY  12/12/2004   Internal external hemorrhoids, +proctits, negative biopsy  . CORONARY ANGIOPLASTY  1998  . CORONARY ARTERY BYPASS GRAFT  04/23/2001   x4, Dr. Pia Mau  . ESOPHAGOGASTRODUODENOSCOPY  11/29/1987   Gastritis  . ETT  12/10/2006   Persantine myoview nml  .  HEMORROIDECTOMY  1954   Fissure repair, [place]  . HEPATIC ARTERY ANGIOPLASTY  1954   [place], [place]  . KIDNEY STONE SURGERY  1977   Dr Redmond Baseman  . LITHOTRIPSY  1990s   Multiple  . US ECHOCARDIOGRAPHY  07/2011   nl sys fxn, EF 55-60%, grade 1 diast dysfunction, mild AS, mildly elevated PA pressure    Assessment / Plan / Recommendation Clinical Impression Patient is an 85 year old right-handed male with history of CAD with CABG, CLL,CKD stage III, diastolic congestive heart failure,  hypertension, CVA 2 years ago maintained on Aggrenox. Per chart review patient lives with spouse. Reportedly independently goes to the gym every morning. One level home with 4 steps to entry. Presented 04/04/2018 with sudden onset of left-sided weakness and dysarthria. Blood pressure 152/75. Cranial CT scan showed an 8.8 mL acute intraparenchymal hematoma centered at the posterior right lentiform nucleus without significant regional mass effect.MRA negative.. Initially placed on Cardene drip for blood pressure control. Dysphagia #3 nectar thick liquids. Therapy evaluations completed with recommendations of physical medicine rehabilitation consult. Patient was admitted for a comprehensive rehabilitation program 04/07/2018.  Patient demonstrates moderate cognitive impairments impacting selective attention, functional problem solving, attention to left field of environment, emergent awareness and recall of functional information. Patient is also labile and can be mildly inappropriate at times. Patient also demonstrates mild dysarthria due to imprecise consonants from left oral-motor weakness and a wet vocal quality with low vocal intensity which impacts intelligibility at the sentence level. Patient consumed trials of ice chips while utilizing a head turn to the left and demonstrated a consistent wet vocal quality that patient was able to clear with Min verbal cues indicative of decreased airway protection. Patient also consumed nectar-thick liquids via both cup and straw and demonstrated overt cough X 1, suspect due to minimal head turn. Minimal prologned mastication noted with use of multiple swallows with solid textures, however, no overt s/s of aspiration noted. Recommend patient continue current diet of Dys. 3 textures with nectar-thick liquids with full supervision for strict use of swallowing compensatory strategies, especially in regards to head turn to the left with both solids and liquids and minimal  distractions during PO Intake.  Patient would benefit from skilled SLP intervention in order to maximize his cognitive and swallowing function prior to discharge. Patient educated in regards to results and recommendations of evaluation and verbalized understanding and agreement.     Skilled Therapeutic Interventions          Administered a cognitive-linguistic evaluation and BSE, please see above for details.   SLP Assessment  Patient will need skilled Speech Lanaguage Pathology Services during CIR admission    Recommendations  SLP Diet Recommendations: Dysphagia 3 (Mech soft);Nectar Liquid Administration via: Cup;Straw Medication Administration: Whole meds with puree Supervision: Full supervision/cueing for compensatory strategies;Patient able to self feed Compensations: Slow rate;Small sips/bites;Minimize environmental distractions Postural Changes and/or Swallow Maneuvers: Out of bed for meals;Seated upright 90 degrees;Head turn left during swallow Oral Care Recommendations: Oral care BID Recommendations for Other Services: Neuropsych consult Patient destination: Home Follow up Recommendations: 24 hour supervision/assistance;Home Health SLP;Outpatient SLP Equipment Recommended: To be determined    SLP Frequency 3 to 5 out of 7 days   SLP Duration  SLP Intensity  SLP Treatment/Interventions 21-23 days   Minumum of 1-2 x/day, 30 to 90 minutes  Cognitive remediation/compensation;Environmental controls;Speech/Language facilitation;Therapeutic Activities;Patient/family education;Dysphagia/aspiration precaution training;Cueing hierarchy;Functional tasks;Internal/external aids    Pain Pain Assessment Pain Score: 0-No pain  Prior Functioning Type of  Home: House  Lives With: Spouse Available Help at Discharge: Family Vocation: Retired  Industrial/product designer Term Goals: Week 1: SLP Short Term Goal 1 (Week 1): Patient will consume current diet with minimal overt s/s of aspiration and Min A verbal  cues for use fo swallowing compensatory strategies.  SLP Short Term Goal 2 (Week 1): Patient will consume trials of thin liquids with a head turn to the left with minimal overt s/s of aspiration over 2 sessions prior to initiating the water protocol.  SLP Short Term Goal 3 (Week 1): Patient will utilize speech intelligibility strategies at the sentence level with supervision verbal cues to maximize intelligibility to 100%.  SLP Short Term Goal 4 (Week 1): Patient will demonstrate selective attention to functional tasks in a minimally distracting enviornment for 10 minutes with Min A verbal cues for redirection.  SLP Short Term Goal 5 (Week 1): Patient will demonstrate functional problem solving for basic and familiar tasks with Mod A verbal cues.   Refer to Care Plan for Long Term Goals  Recommendations for other services: Neuropsych  Discharge Criteria: Patient will be discharged from SLP if patient refuses treatment 3 consecutive times without medical reason, if treatment goals not met, if there is a change in medical status, if patient makes no progress towards goals or if patient is discharged from hospital.  The above assessment, treatment plan, treatment alternatives and goals were discussed and mutually agreed upon: by patient  Nashia Remus 04/08/2018, 3:16 PM

## 2018-04-08 NOTE — Progress Notes (Signed)
Per nursing, patient was given "Data Collection Information Summary for Patients in Inpatient Rehabilitation Facilities with attached Privacy Act Statement Health Care Records" upon admission.    Patient information reviewed and entered into e[facility] System by Becky Almas Rake, PPS coordinator. Information including medical coding, function ability, and quality indicators will be reviewed and updated through discharge.   

## 2018-04-08 NOTE — Patient Care Conference (Signed)
Inpatient RehabilitationTeam Conference and Plan of Care Update Date: 04/08/2018   Time: 11:20 AM    Patient Name: Jimmy [name]      Medical Record Number: [id number]  Date of Birth: 26-Aug-1928 Sex: Male         Room/Bed: 4W18C/4W18C-01 Payor Info: Payor: MEDICARE / Plan: MEDICARE PART A AND B / Product Type: *No Product type* /    Admitting Diagnosis: r bg hemorrhage  Admit Date/Time:  04/07/2018  4:24 PM Admission Comments: No comment available   Primary Diagnosis:  <principal problem not specified> Principal Problem: <principal problem not specified>  Patient Active Problem List   Diagnosis Date Noted  . Dysphagia 04/07/2018  . Family history of stroke 04/07/2018  . IVH (intraventricular hemorrhage) ([place]) 04/07/2018  . ICH (intracerebral hemorrhage) ([place]) 04/04/2018  . MCI (mild cognitive impairment) with memory loss 06/17/2017  . Dizziness 06/17/2017  . Diarrhea 10/18/2016  . TIA (transient ischemic attack) 04/08/2016  . Thrombocytopenia ([place]) 04/08/2016  . Leg weakness 11/11/2014  . Advanced care planning/counseling discussion 09/16/2014  . Nocturia 09/16/2014  . Mass of submandibular region   . History of CVA (cerebrovascular accident)   . Chest pressure 11/19/2013  . S/P CABG (coronary artery bypass graft) 11/19/2013  . Chronic kidney disease, stage 3 ([place])   . Aortic stenosis   . Medicare annual wellness visit, subsequent 08/27/2012  . Vitamin D deficiency 08/17/2012  . Dyspnea 06/26/2011  . Erectile dysfunction 09/18/2010  . Carotid artery stenosis 12/01/2009  . UNSPECIFIED SUBJECTIVE VISUAL DISTURBANCE 05/01/2009  . BACK PAIN, LUMBAR 10/24/2008  . Chronic lymphocytic leukemia, Rai stage 0 ([place]) 04/02/2007    Class: Chronic  . DERMATITIS, SEBORRHEIC NOS 12/16/2006  . GENITAL HERPES 12/04/2006  . Mixed hyperlipidemia 12/04/2006  . Essential hypertension 12/04/2006  . Coronary atherosclerosis 12/04/2006  . GERD 12/04/2006  . Prediabetes 12/04/2006  . RENAL  CALCULUS, HX OF 12/04/2006    Expected Discharge Date:    Team Members Present: Physician leading conference: Dr. Alysia Penna Social Worker Present: Ovidio Kin, LCSW Nurse Present: Rozetta Nunnery, RN PT Present: Michaelene Song, PT OT Present: Willeen Cass, OT;Roanna Epley, COTA SLP Present: Weston Anna, SLP PPS Coordinator present : Gunnar Fusi     Current Status/Progress Goal Weekly Team Focus  Medical   Right basal ganglia hemorrhage with intraventricular extension.  Maintain medical stability, manage blood pressure  Initiate rehabilitation program   Bowel/Bladder   Patient was incontinenet of bowel,wears a condom catheter at night, LBM 04/07/18  less episodes of incontinence  toileting schedule   Swallow/Nutrition/ Hydration   Eval Pending         ADL's     eval pending        Mobility   mod assist for all mobility including gait up to 25 ft at R rail  supervision   balance, attention, awareness, gait, transfers, L NMR   Communication   Eval Pending          Safety/Cognition/ Behavioral Observations  Eval Pending          Pain   no c/o pain, has tylenol prn  pain scale ,2/10  asses & treat as needed   Skin   MASD perineal area, bruise to right AC  no aquired skin break down while on IP rehab  assess q shift      *See Care Plan and progress notes for long and short-term goals.     Barriers to Discharge  Current Status/Progress Possible Resolutions Date Resolved  Physician    Medical stability;Other (comments)  Elderly wife  Initiating rehabilitation program  See above      Nursing                  PT  Behavior                 OT                  SLP                SW Decreased caregiver support Wife can only do supervision level            Discharge Planning/Teaching Needs:    Home with wife who can provide supervision level. Daughter also to assist with pt's care. New eval today     Team Discussion:  Setting goals today since first day on  rehab-PT has seen and feels currently mod assist level and impulsive. Nursing reports incontinent and will begin working on this. OT and SP have not seen yet.  ELOS 14-18 days  Revisions to Treatment Plan:  New eval    Continued Need for Acute Rehabilitation Level of Care: The patient requires daily medical management by a physician with specialized training in physical medicine and rehabilitation for the following conditions: Daily direction of a multidisciplinary physical rehabilitation program to ensure safe treatment while eliciting the highest outcome that is of practical value to the patient.: Yes Daily medical management of patient stability for increased activity during participation in an intensive rehabilitation regime.: Yes Daily analysis of laboratory values and/or radiology reports with any subsequent need for medication adjustment of medical intervention for : Neurological problems;Blood pressure problems   I attest that I was present, lead the team conference, and concur with the assessment and plan of the team.   Elease Hashimoto 04/08/2018, 11:50 AM

## 2018-04-09 ENCOUNTER — Inpatient Hospital Stay (HOSPITAL_COMMUNITY): Payer: Medicare Other | Admitting: Physical Therapy

## 2018-04-09 ENCOUNTER — Inpatient Hospital Stay (HOSPITAL_COMMUNITY): Payer: Medicare Other

## 2018-04-09 ENCOUNTER — Inpatient Hospital Stay (HOSPITAL_COMMUNITY): Payer: Medicare Other | Admitting: Speech Pathology

## 2018-04-09 MED ORDER — PANTOPRAZOLE SODIUM 40 MG PO TBEC
40.0000 mg | DELAYED_RELEASE_TABLET | Freq: Every day | ORAL | Status: DC
  Administered 2018-04-09 – 2018-04-28 (×20): 40 mg via ORAL
  Filled 2018-04-09 (×20): qty 1

## 2018-04-09 NOTE — Progress Notes (Signed)
Physical Therapy Session Note  Patient Details  Name: Jimmy [name] MRN: [id number] Date of Birth: 03/06/1929  Today's Date: 04/09/2018 PT Individual Time: 1300-1330 AND 1500-1540 PT Individual Time Calculation (min): 30 min AND 40 min  Short Term Goals: Week 1:  PT Short Term Goal 1 (Week 1): Pt will performed bed<>chair transfers with min assist PT Short Term Goal 2 (Week 1): Pt will performed bed mobility with CGA PT Short Term Goal 3 (Week 1): Pt will ambulate x 50 ft with LRAD and min assist   Skilled Therapeutic Interventions/Progress Updates:   Session 1:  Pt received in w/c, starting to eat lunch. Agreeable to therapy and denies pain. Session focused on cognitive remediation with functional tasks. Provided skilled cues while pt finished eating including cues for increased awareness of food on L side of mouth/lips and almost constant reminders for L head turn w/ all bites. Min-mod verbal cues to attend to task of eating, easily distracted by people in hallway. Ended session in w/c, all needs in reach.   Session 2:  Pt in w/c and agreeable to therapy, denies pain. Pt self-propelled w/c to/from therapy gym w/ min assist overall utilizing R hemi technique to work on coordination. Worked on gait training and pre-gait tasks while in gym. Ambulated 30' x2 w/ RW and mod-max assist overall. Manual assist for R lateral weight shifting during L swing limb advancement, manual assist for RW management, and assist for upright balance. Verbal cues for increased L step length and for L quad activation in single leg stance. Worked on L hip flexion and toe clearance while performing 2" step taps, UE support on RW. Able to do so w/o manual assist for LLE from therapist. Returned to room via w/c. Ended session in w/c and all needs in reach.    Therapy Documentation Precautions:  Precautions Precautions: Fall Precaution Comments: left hemiparesis, left inattention Restrictions Weight Bearing  Restrictions: No  Therapy/Group: Individual Therapy  [name] [name] 04/09/2018, 1:34 PM

## 2018-04-09 NOTE — IPOC Note (Signed)
Overall Plan of Care [facility]) Patient Details Name: Jimmy [name] MRN: [id number] DOB: 12-Apr-1928  Admitting Diagnosis: <principal problem not specified>  Hospital Problems: Active Problems:   IVH (intraventricular hemorrhage) (HCC)     Functional Problem List: Nursing Endurance  PT Balance, Edema, Behavior, Motor, Endurance, Nutrition, Perception, Pain, Safety, Sensory  OT Balance, Cognition, Endurance, Motor, Perception, Safety, Sensory, Vision  SLP    TR         Basic ADL's: OT Eating, Grooming, Bathing, Dressing, Toileting     Advanced  ADL's: OT       Transfers: PT Bed Mobility, Bed to Chair, Car, [employer], Futures trader, Metallurgist: PT Ambulation, Emergency planning/management officer, Stairs     Additional Impairments: OT Fuctional Use of Upper Extremity  SLP Swallowing, Communication expression Social Interaction, Problem Solving, Memory, Attention, Awareness  TR      Anticipated Outcomes Item Anticipated Outcome  Self Feeding Independent  Swallowing  Supervision   Basic self-care  S  Toileting  S   Bathroom Transfers S to toilet, CGA to shower stall  Bowel/Bladder  Pt will manage bowel and bladder with min assist while in rehab.   Transfers  supervision   Locomotion  supervision   Communication  Mod I  Cognition  Supervision   Pain  Pt will manage pain at <2 while in rehab.   Safety/Judgment  Pt will follow safety precautions with min assist/cues while in rehab.    Therapy Plan: PT Intensity: Minimum of 1-2 x/day ,45 to 90 minutes PT Frequency: 5 out of 7 days PT Duration Estimated Length of Stay: 14-18 days OT Intensity: Minimum of 1-2 x/day, 45 to 90 minutes OT Frequency: 5 out of 7 days OT Duration/Estimated Length of Stay: 21-23 days SLP Intensity: Minumum of 1-2 x/day, 30 to 90 minutes SLP Frequency: 3 to 5 out of 7 days SLP Duration/Estimated Length of Stay: 21-23 days     Team Interventions: Nursing Interventions  Patient/Family Education, Bladder Management, Bowel Management, Medication Management, Cognitive Remediation/Compensation, Disease Management/Prevention, Dysphagia/Aspiration Precaution Training, Discharge Planning  PT interventions Ambulation/gait training, Disease management/prevention, Stair training, Visual/perceptual remediation/compensation, Training and development officer, DME/adaptive equipment instruction, Patient/family education, Therapeutic Activities, Wheelchair propulsion/positioning, Cognitive remediation/compensation, Psychosocial support, Therapeutic Exercise, Community reintegration, Functional mobility training, Skin care/wound management, UE/LE Strength taining/ROM, Discharge planning, Neuromuscular re-education, Splinting/orthotics, UE/LE Coordination activities  OT Interventions Balance/vestibular training, Discharge planning, DME/adaptive equipment instruction, Functional mobility training, Neuromuscular re-education, Psychosocial support, Patient/family education, Self Care/advanced ADL retraining, Therapeutic Activities, Therapeutic Exercise, UE/LE Strength taining/ROM, UE/LE Coordination activities, Visual/perceptual remediation/compensation  SLP Interventions Cognitive remediation/compensation, Environmental controls, Speech/Language facilitation, Therapeutic Activities, Patient/family education, Dysphagia/aspiration precaution training, Cueing hierarchy, Functional tasks, Internal/external aids  TR Interventions    SW/CM Interventions Discharge Planning, Psychosocial Support, Patient/Family Education   Barriers to Discharge MD  Medical stability  Nursing      PT Behavior    OT      SLP      SW Decreased caregiver support Wife can only do supervision level   Team Discharge Planning: Destination: PT-Home ,OT- Home , SLP-Home Projected Follow-up: PT-Outpatient PT, OT-  Home health OT, SLP-24 hour supervision/assistance, Home Health SLP, Outpatient SLP Projected Equipment  Needs: PT-To be determined, OT- Tub/shower seat, SLP-To be determined Equipment Details: PT- , OT-  Patient/family involved in discharge planning: PT- Patient, Family member/caregiver,  OT-Patient, Family member/caregiver, SLP-Patient  MD ELOS: 14-18d Medical Rehab Prognosis:  Excellent Assessment:  85 year old right-handed male with history of CAD with CABG,  CLL,CKD stage III, diastolic congestive heart failure, hypertension, CVA 2 years ago maintained on Aggrenox. Per chart review patient lives with spouse. Reportedly independently goes to the gym every morning. One level home with 4 steps to entry. Presented 04/04/2018 with sudden onset of left-sided weakness and dysarthria. Blood pressure 152/75. Cranial CT scan showed an 8.8 mL acute intraparenchymal hematoma centered at the posterior right lentiform nucleus without significant regional mass effect.MRA negative.. Initially placed on Cardene drip for blood pressure control. Dysphagia #3 nectar thick liquids  Now requiring 24/7 Rehab RN,MD, as well as CIR level PT, OT and SLP.  Treatment team will focus on ADLs and mobility with goals set at [facility]   See Team Conference Notes for weekly updates to the plan of care

## 2018-04-09 NOTE — Progress Notes (Signed)
Occupational Therapy Session Note  Patient Details  Name: Jimmy [name] MRN: [id number] Date of Birth: 1928-12-20  Today's Date: 04/09/2018 OT Individual Time: 1045-1200 OT Individual Time Calculation (min): 75 min    Short Term Goals: Week 1:  OT Short Term Goal 1 (Week 1): Pt will complete toilet transfer with min A. OT Short Term Goal 2 (Week 1): Pt will weight shift to his L side to cleanse self on toilet with CGA. OT Short Term Goal 3 (Week 1): Pt will bathe with min A. OT Short Term Goal 4 (Week 1): Pt will don shirt with min A. OT Short Term Goal 5 (Week 1): Pt will be able to use LUE as a stabilizing A with donning LB clothing.  Skilled Therapeutic Interventions/Progress Updates:    OT intervention with focus on bed mobility, sit<>stand, standing balance, functional transfers, BADL retraining, forced LUE use, activity tolerance, and safety awareness to increase independence with BADLs.  See below for ADL assistance.  Pt completed shaving task at sink prior to bathing at shower level and dressing with sit<>stand from w/c at sink.  Pt completed shaving task with supervision/setup.  Pt required mod verbal cues for sitting balance throughout session; pt with L lean but able to self correct.  Pt initiates use of LUE in bathing/dressing tasks but lacks adequate grasp to perform task without assistance.  Continued education on hemi dressing techniques.  Pt required mod verbal cues for weight shifts when standing with min A.  Pt remained in w/c with all needs within reach and chair alarm activated.   Therapy Documentation Precautions:  Precautions Precautions: Fall Precaution Comments: left hemiparesis, left inattention Restrictions Weight Bearing Restrictions: No Pain:  Pt denies pain ADL: ADL Eating: Minimal assistance Grooming: Setup, Supervision/safety Upper Body Bathing: Minimal assistance Lower Body Bathing: Moderate assistance Upper Body Dressing: Moderate assistance Lower  Body Dressing: Moderate assistance Toileting: Moderate assistance Where Assessed-Toileting: Interior and spatial designer: Moderate assistance, Moderate cueing Social research officer, government Method: Stand pivot Youth worker: Grab bars, Transfer tub bench   Therapy/Group: Individual Therapy  [name] 04/09/2018, 2:57 PM

## 2018-04-09 NOTE — Progress Notes (Signed)
[place] PHYSICAL MEDICINE & REHABILITATION PROGRESS NOTE   Subjective/Complaints:  Labs reviewed, pt with coughing last noc, no heartburn noted but has a histroy of this an dsaw a surgeon for possible surgery in past.  Takes TUMs at home, no abd pain  ROS- denies CP, SOB, N/V/D  Objective:   No results found. Recent Labs    04/08/18 0536  WBC 5.3  HGB 13.7  HCT 40.5  PLT 123*   Recent Labs    04/08/18 0536  NA 137  K 3.7  CL 105  CO2 24  GLUCOSE 108*  BUN 15  CREATININE 1.29*  CALCIUM 9.2    Intake/Output Summary (Last 24 hours) at 04/09/2018 0810 Last data filed at 04/08/2018 1230 Gross per 24 hour  Intake 240 ml  Output -  Net 240 ml     Physical Exam: Vital Signs Blood pressure 136/70, pulse 71, temperature 98.4 F (36.9 C), resp. rate 20, height 5' 9.5" (1.765 m), weight 76.2 kg, SpO2 95 %.  Physical Exam Constitutional:No distress.  HENT:  Head:Normocephalicand atraumatic.  Eyes:Pupils are equal, round, and reactive to light.  Neck:No tracheal deviationpresent. No thyromegalypresent.  Cardiovascular:Normal rateand regular rhythm. Exam revealsno friction rub. Murmurheard. Respiratory:Effort normal. Norespiratory distress. He hasno wheezes. He hasno rales.  FU:XNAT.Bowel sounds are normal. He exhibitsno distension. There isno abdominal tenderness.  Musculoskeletal:Normal range of motion.  Neurological: Patient is alert sitting up in bed  Left central 7. Speech is dysarthric but intelligible. He provides his name and age, oriented to place. Fair insight and awareness. . LUE: 2+ delt and biceps, 2 triceps, 2- WE, 0 HI. LLE: 2+ HF, KE and 3/5 ADF/PF. RUE and RLE grossly 4 to 5/5. Sensory 1+/2 LUE and LLE..  Skin: Skin iswarm. He isnot diaphoretic.  Psychiatric: He has anormal mood and affect. Hisbehavior is normal.   Assessment/Plan: 1. Functional deficits secondary to RIght BG ICH which require 3+ hours per day of  interdisciplinary therapy in a comprehensive inpatient rehab setting.  Physiatrist is providing close team supervision and 24 hour management of active medical problems listed below.  Physiatrist and rehab team continue to assess barriers to discharge/monitor patient progress toward functional and medical goals  Care Tool:  Bathing    Body parts bathed by patient: Chest, Left arm, Abdomen, Front perineal area, Right upper leg, Left upper leg, Face, Buttocks   Body parts bathed by helper: Right lower leg, Left lower leg, Right arm     Bathing assist Assist Level: Moderate Assistance - Patient 50 - 74%     Upper Body Dressing/Undressing Upper body dressing   What is the patient wearing?: Pull over shirt    Upper body assist Assist Level: Moderate Assistance - Patient 50 - 74%    Lower Body Dressing/Undressing Lower body dressing      What is the patient wearing?: Underwear/pull up, Pants     Lower body assist Assist for lower body dressing: Maximal Assistance - Patient 25 - 49%     Toileting Toileting    Toileting assist Assist for toileting: Maximal Assistance - Patient 25 - 49%     Transfers Chair/bed transfer  Transfers assist  Chair/bed transfer activity did not occur: Safety/medical concerns  Chair/bed transfer assist level: Moderate Assistance - Patient 50 - 74%     Locomotion Ambulation   Ambulation assist      Assist level: Moderate Assistance - Patient 50 - 74% Assistive device: Other (comment)(R rail in hallway) Max distance: 25 ft  Walk 10 feet activity   Assist     Assist level: Moderate Assistance - Patient - 50 - 74% Assistive device: Walker-rolling   Walk 50 feet activity   Assist Walk 50 feet with 2 turns activity did not occur: Safety/medical concerns         Walk 150 feet activity   Assist Walk 150 feet activity did not occur: Safety/medical concerns         Walk 10 feet on uneven surface  activity   Assist  Walk 10 feet on uneven surfaces activity did not occur: Safety/medical concerns         Wheelchair     Assist Will patient use wheelchair at discharge?: No             Wheelchair 50 feet with 2 turns activity    Assist            Wheelchair 150 feet activity     Assist          Medical Problem List and Plan: 1.  Left side weakness with dysarthria and dysphagia secondary to acute right basal ganglia hemorrhage secondary to hypertensive crisis             -CIR PT, OT,SLP  2. DVT Prophylaxis/Anticoagulation: SCDs 3. Pain Management:  Tylenol as needed 4. Mood:  Provide emotional support 5. Neuropsych: This patient is capable of making decisions on his own behalf. 6. Skin/Wound Care:  Routine skin checks 7. Fluids/Electrolytes/Nutrition:  Routine in and out's with follow-up chemistries upon admit 8. Dysphagia. Dysphagia #3 neck thick liquids. Follow-up speech therapy Coughing related to this, no fever- will have difficultly with left head turn due to neck pain 9. Hypertension. Lisinopril 2.5 mg daily. Monitor with increased mobility Vitals:   04/08/18 2120 04/09/18 0523  BP: (!) 157/76 136/70  Pulse: 63 71  Resp: 20 20  Temp: 99.6 F (37.6 C) 98.4 F (36.9 C)  SpO2: 16% 10%  systolic HTN ,lisinopril 2.5 mg daily will monitor prior to dosage change 10. History of gout. Allopurinol 300 mg daily. 11. History of CAD with CABG. No chest pain or shortness of breath 12. CKD stage III. Baseline creatinine 1.40- at/ below baseline 13.  Cough- likely multifactorial - no fever , lung exam normal, hx GERD, cont protonix, SLP to work on RMT    LOS: 2 days A FACE TO [place] E Vala Raffo 04/09/2018, 8:10 AM

## 2018-04-09 NOTE — Progress Notes (Signed)
Speech Language Pathology Daily Session Note  Patient Details  Name: Jimmy [name] MRN: [id number] Date of Birth: 1928-11-29  Today's Date: 04/09/2018 SLP Individual Time: 0725-0825 SLP Individual Time Calculation (min): 60 min  Short Term Goals: Week 1: SLP Short Term Goal 1 (Week 1): Patient will consume current diet with minimal overt s/s of aspiration and Min A verbal cues for use fo swallowing compensatory strategies.  SLP Short Term Goal 2 (Week 1): Patient will consume trials of thin liquids with a head turn to the left with minimal overt s/s of aspiration over 2 sessions prior to initiating the water protocol.  SLP Short Term Goal 3 (Week 1): Patient will utilize speech intelligibility strategies at the sentence level with supervision verbal cues to maximize intelligibility to 100%.  SLP Short Term Goal 4 (Week 1): Patient will demonstrate selective attention to functional tasks in a minimally distracting enviornment for 10 minutes with Min A verbal cues for redirection.  SLP Short Term Goal 5 (Week 1): Patient will demonstrate functional problem solving for basic and familiar tasks with Mod A verbal cues.   Skilled Therapeutic Interventions: Skilled treatment session focused on dysphagia and cognitive goals. SLP facilitated session by providing skilled observation with breakfast meal of Dys. 3 textures with nectar-thick liquids. Patient required Mod A verbal cues for use of small bites/sips and for complete head turn to the left. Consistent overt s/s of aspiration were observed when patient's head was not utilizing a complete turn. Patient also required Mod A verbal cues for sustained attention to self-feeding due to frequently attempting to talk with a full oral cavity. Patient's wife present and educated in regards to patient's current swallowing function, diet recommendations and swallowing compensatory strategies.  She verbalized understanding. Patient left upright in bed with NT  present. Continue with current plan of care.      Pain No/Denies Pain   Therapy/Group: Individual Therapy  Yudith Norlander 04/09/2018, 12:33 PM

## 2018-04-10 ENCOUNTER — Inpatient Hospital Stay (HOSPITAL_COMMUNITY): Payer: Medicare Other

## 2018-04-10 ENCOUNTER — Inpatient Hospital Stay (HOSPITAL_COMMUNITY): Payer: Medicare Other | Admitting: Speech Pathology

## 2018-04-10 ENCOUNTER — Inpatient Hospital Stay (HOSPITAL_COMMUNITY): Payer: Medicare Other | Admitting: Physical Therapy

## 2018-04-10 MED ORDER — LISINOPRIL 5 MG PO TABS
5.0000 mg | ORAL_TABLET | Freq: Every day | ORAL | Status: DC
  Administered 2018-04-11 – 2018-04-29 (×19): 5 mg via ORAL
  Filled 2018-04-10 (×19): qty 1

## 2018-04-10 NOTE — Progress Notes (Signed)
[facility] PHYSICAL MEDICINE & REHABILITATION PROGRESS NOTE   Subjective/Complaints:  No issues overnite  ROS- denies CP, SOB, N/V/D  Objective:   No results found. Recent Labs    04/08/18 0536  WBC 5.3  HGB 13.7  HCT 40.5  PLT 123*   Recent Labs    04/08/18 0536  NA 137  K 3.7  CL 105  CO2 24  GLUCOSE 108*  BUN 15  CREATININE 1.29*  CALCIUM 9.2    Intake/Output Summary (Last 24 hours) at 04/10/2018 0818 Last data filed at 04/09/2018 2205 Gross per 24 hour  Intake 780 ml  Output 600 ml  Net 180 ml     Physical Exam: Vital Signs Blood pressure 131/61, pulse 66, temperature 97.6 F (36.4 C), temperature source Oral, resp. rate 18, height 5' 9.5" (1.765 m), weight 76.2 kg, SpO2 93 %.  Physical Exam Constitutional:No distress.  HENT:  Head:Normocephalicand atraumatic.  Eyes:Pupils are equal, round, and reactive to light.  Neck:No tracheal deviationpresent. No thyromegalypresent.  Cardiovascular:Normal rateand regular rhythm. Exam revealsno friction rub. Murmurheard. Respiratory:Effort normal. Norespiratory distress. He hasno wheezes. He hasno rales.  ZO:XWRU.Bowel sounds are normal. He exhibitsno distension. There isno abdominal tenderness.  Musculoskeletal:Normal range of motion.  Neurological: Patient is alert sitting up in bed  Left central 7. Speech is dysarthric but intelligible. He provides his name and age, oriented to place. Fair insight and awareness. . LUE: 2+ delt and biceps, 2 triceps, 2- WE, 0 HI. LLE: 2+ HF, KE and 3/5 ADF/PF. RUE and RLE grossly 4 to 5/5. Sensory 1+/2 LUE and LLE..  Skin: Skin iswarm. He isnot diaphoretic.  Psychiatric: He has anormal mood and affect. Hisbehavior is normal.   Assessment/Plan: 1. Functional deficits secondary to RIght BG ICH which require 3+ hours per day of interdisciplinary therapy in a comprehensive inpatient rehab setting.  Physiatrist is providing close team supervision and  24 hour management of active medical problems listed below.  Physiatrist and rehab team continue to assess barriers to discharge/monitor patient progress toward functional and medical goals  Care Tool:  Bathing    Body parts bathed by patient: Chest, Left arm, Abdomen, Front perineal area, Right upper leg, Left upper leg, Face, Buttocks   Body parts bathed by helper: Right lower leg, Left lower leg, Right arm     Bathing assist Assist Level: Minimal Assistance - Patient > 75%     Upper Body Dressing/Undressing Upper body dressing   What is the patient wearing?: Pull over shirt    Upper body assist Assist Level: Moderate Assistance - Patient 50 - 74%    Lower Body Dressing/Undressing Lower body dressing      What is the patient wearing?: Underwear/pull up, Pants     Lower body assist Assist for lower body dressing: Moderate Assistance - Patient 50 - 74%     Toileting Toileting    Toileting assist Assist for toileting: Maximal Assistance - Patient 25 - 49%     Transfers Chair/bed transfer  Transfers assist  Chair/bed transfer activity did not occur: Safety/medical concerns  Chair/bed transfer assist level: Moderate Assistance - Patient 50 - 74%     Locomotion Ambulation   Ambulation assist      Assist level: Maximal Assistance - Patient 25 - 49% Assistive device: Walker-rolling Max distance: 30'   Walk 10 feet activity   Assist     Assist level: Maximal Assistance - Patient 25 - 49% Assistive device: Walker-rolling   Walk 50 feet activity  Assist Walk 50 feet with 2 turns activity did not occur: Safety/medical concerns         Walk 150 feet activity   Assist Walk 150 feet activity did not occur: Safety/medical concerns         Walk 10 feet on uneven surface  activity   Assist Walk 10 feet on uneven surfaces activity did not occur: Safety/medical concerns         Wheelchair     Assist Will patient use wheelchair at  discharge?: No      Wheelchair assist level: Minimal Assistance - Patient > 75% Max wheelchair distance: 150'    Wheelchair 50 feet with 2 turns activity    Assist        Assist Level: Minimal Assistance - Patient > 75%   Wheelchair 150 feet activity     Assist     Assist Level: Minimal Assistance - Patient > 75%    Medical Problem List and Plan: 1.  Left side weakness with dysarthria and dysphagia secondary to acute right basal ganglia hemorrhage secondary to hypertensive crisis             -CIR PT, OT,SLP  2. DVT Prophylaxis/Anticoagulation: SCDs 3. Pain Management:  Tylenol as needed 4. Mood:  Provide emotional support 5. Neuropsych: This patient is capable of making decisions on his own behalf. 6. Skin/Wound Care:  Routine skin checks 7. Fluids/Electrolytes/Nutrition:  Routine in and out's with follow-up chemistries upon admit 8. Dysphagia. Dysphagia #3 neck thick liquids. Follow-up speech therapy Coughing related to this, no fever- will have difficultly with left head turn due to neck pain 9. Hypertension. Lisinopril 2.5 mg daily. Monitor with increased mobility Vitals:   04/09/18 2014 04/10/18 0517  BP: (!) 164/64 131/61  Pulse: (!) 57 66  Resp: 18 18  Temp: 97.6 F (36.4 C) 97.6 F (36.4 C)  SpO2: 09% 47%  systolic HTN ,lisinopril 2.5 mg daily, improved this am 1/10 pm elevated , home dose of lisinopril is 5mg  10. History of gout. Allopurinol 300 mg daily.no synovitis 11. History of CAD with CABG. No chest pain or shortness of breath 12. CKD stage III. Baseline creatinine 1.40- at/ below baseline 13.  Cough- likely multifactorial - no fever , lung exam normal, hx GERD, cont protonix, SLP to work on RMT    LOS: 3 days A FACE TO [place] E Kirsteins 04/10/2018, 8:18 AM

## 2018-04-10 NOTE — Plan of Care (Signed)
  Problem: Consults Goal: RH STROKE PATIENT EDUCATION Description See Patient Education module for education specifics, pt / fam verb understanding with min A  Outcome: Progressing Goal: Nutrition Consult-if indicated Outcome: Progressing   Problem: RH BOWEL ELIMINATION Goal: RH STG MANAGE BOWEL WITH ASSISTANCE Description STG Manage Bowel with min  Assistance.  Outcome: Progressing Goal: RH STG MANAGE BOWEL W/MEDICATION W/ASSISTANCE Description STG Manage Bowel with Medication with min Assistance.  Outcome: Progressing   Problem: RH SKIN INTEGRITY Goal: RH STG SKIN FREE OF INFECTION/BREAKDOWN Description Pt will have no infection or breakdown on discharge with min A  Outcome: Progressing   Problem: RH SAFETY Goal: RH STG ADHERE TO SAFETY PRECAUTIONS W/ASSISTANCE/DEVICE Description STG Adhere to Safety Precautions With min Assistance/Device.  Outcome: Progressing   Problem: RH PAIN MANAGEMENT Goal: RH STG PAIN MANAGED AT OR BELOW PT'S PAIN GOAL Description Pain <2 with min A  Outcome: Progressing    Pt incontinent of urine

## 2018-04-10 NOTE — Progress Notes (Signed)
Occupational Therapy Session Note  Patient Details  Name: Jimmy [name] MRN: [id number] Date of Birth: 29-Jan-1929  Today's Date: 04/10/2018 OT Individual Time: 1100-1200 OT Individual Time Calculation (min): 60 min    Short Term Goals: Week 1:  OT Short Term Goal 1 (Week 1): Pt will complete toilet transfer with min A. OT Short Term Goal 2 (Week 1): Pt will weight shift to his L side to cleanse self on toilet with CGA. OT Short Term Goal 3 (Week 1): Pt will bathe with min A. OT Short Term Goal 4 (Week 1): Pt will don shirt with min A. OT Short Term Goal 5 (Week 1): Pt will be able to use LUE as a stabilizing A with donning LB clothing.  Skilled Therapeutic Interventions/Progress Updates:    OT intervention with focus on sitting balance, sit<>stand, standing balance, BADL retraining, attention to L, attention to task, safety awareness, and activity tolerance to increase independence with BADLs. Pt engaged in bathing at shower level and dressing with sit<>stand from w/c at sink.  See below for assistance. Pt requires mac verbal cues for safety awareness and sequencing with sit<>stand.  Pt requires max verbal cues for sitting balance.  Pt with significant L lean but able to self correct.  Pt states he is aware he is leaning but does not initiate correcting.  Pt internally distracted during session and requires max verbal cues to redirect to task. Pt initiates use of LUE in functional tasks but lacks adequate grasp to complete tasks.  Pt remained seated in w/c with all needs within reach and chair alarm activated.  Therapy Documentation Precautions:  Precautions Precautions: Fall Precaution Comments: left hemiparesis, left inattention Restrictions Weight Bearing Restrictions: No   Pain:  Pt c/o L shoulder pain; repositioned ADL: ADL Eating: Minimal assistance Grooming: Setup, Supervision/safety Where Assessed-Grooming: Sitting at sink Upper Body Bathing: Minimal assistance Where  Assessed-Upper Body Bathing: Shower Lower Body Bathing: Moderate assistance Where Assessed-Lower Body Bathing: Shower Upper Body Dressing: Moderate assistance Where Assessed-Upper Body Dressing: Sitting at sink Lower Body Dressing: Moderate assistance Where Assessed-Lower Body Dressing: Sitting at sink, Standing at sink Toileting: Moderate assistance Where Assessed-Toileting: Glass blower/designer: Moderate assistance Toilet Transfer Method: Stand pivot Toilet Transfer Equipment: Energy manager: Minimal assistance, Maximal cueing Social research officer, government Method: Radiographer, therapeutic: Grab bars, Transfer tub bench   Therapy/Group: Individual Therapy  [name] 04/10/2018, 12:12 PM

## 2018-04-10 NOTE — Progress Notes (Signed)
Physical Therapy Session Note  Patient Details  Name: Jimmy [name] MRN: [id number] Date of Birth: [birth year]  Today's Date: 04/10/2018 PT Individual Time: 0800-0840 AND 1415-1440 PT Individual Time Calculation (min): 40 min AND 25 min  Short Term Goals: Week 1:  PT Short Term Goal 1 (Week 1): Pt will performed bed<>chair transfers with min assist PT Short Term Goal 2 (Week 1): Pt will performed bed mobility with CGA PT Short Term Goal 3 (Week 1): Pt will ambulate x 50 ft with LRAD and min assist   Skilled Therapeutic Interventions/Progress Updates:   Session 1:  Pt sitting up in bed, eating breakfast, agreeable to therapy and denies pain. Agreeable to save breakfast until after therapy. Min assist transfer to EOB and min assist to maintain dynamic sitting balance while donning pants and shoes w/ total assist for time management. Mod assist stand pivot to w/c. Total assist w/c transport to/from therapy gym. NuStep 5 min x2 @ level 4 for LE strengthening w/ focus on LLE muscle activation. Frequent tactile and verbal cues to maintain neutral LLE alignment. Returned to room and ended session in w/c, all needs in reach. Provided w/ thickened coffee per pt's request.   Session 2:  Pt in w/c and agreeable to therapy, denies pain. Total assist w/c transport to therapy gym for time management. Worked on gait training while in therapy gym. Ambulated 25' x2 w/ mod assist overall while pt's LUE was over therapist's shoulder to allow for ease of facilitating lateral weight shifting and maintaining upright balance. Mod assist for R lateral weight shifting during L swing limb advancement and verbal cues for increased L step length and L quad activation in single leg stance. L knee buckled 2-3x, however pt able to self correct w/ mod assist from therapist. Pt self-propelled w/c back to room w/ min assist via R hemi technique to work on coordination. Ended session in w/c, all needs in reach.   Therapy  Documentation Precautions:  Precautions Precautions: Fall Precaution Comments: left hemiparesis, left inattention Restrictions Weight Bearing Restrictions: No Vital Signs: Therapy Vitals Temp: 97.6 F (36.4 C) Temp Source: Oral Pulse Rate: 66 Resp: 18 BP: 131/61 Patient Position (if appropriate): Lying Oxygen Therapy SpO2: 93 %  Therapy/Group: Individual Therapy  Cora Stetson Clent Demark 04/10/2018, 8:47 AM

## 2018-04-10 NOTE — Progress Notes (Addendum)
Speech Language Pathology Daily Session Note  Patient Details  Name: Jimmy [name] MRN: [id number] Date of Birth: 09-06-1928  Today's Date: 04/10/2018 SLP Individual Time: 0930-1030 SLP Individual Time Calculation (min): 60 min  Short Term Goals: Week 1: SLP Short Term Goal 1 (Week 1): Patient will consume current diet with minimal overt s/s of aspiration and Min A verbal cues for use fo swallowing compensatory strategies.  SLP Short Term Goal 2 (Week 1): Patient will consume trials of thin liquids with a head turn to the left with minimal overt s/s of aspiration over 2 sessions prior to initiating the water protocol.  SLP Short Term Goal 3 (Week 1): Patient will utilize speech intelligibility strategies at the sentence level with supervision verbal cues to maximize intelligibility to 100%.  SLP Short Term Goal 4 (Week 1): Patient will demonstrate selective attention to functional tasks in a minimally distracting enviornment for 10 minutes with Min A verbal cues for redirection.  SLP Short Term Goal 5 (Week 1): Patient will demonstrate functional problem solving for basic and familiar tasks with Mod A verbal cues.   Skilled Therapeutic Interventions: Skilled treatment session focused on speech and dysphagia goals. SLP administered RMT testing. Patient's peak MIP was 25 cm H2O which is below the average range of 84-48 cm H2O and patient's peak MEP was 39 cm H2O which is also below the average range of 101-44 cm H2O for someone of patient's gender and age. SLP introduced IMST and EMST devices. Patient required Min A multimodal cues to perform IMST exercises accurately at 17 cm H2O, however, performed EMST exercises with Max  verbal and tactile cues for labial seal at 30 cm H2O. Patient performed IMST exercises with a self-perceived effort level of 5/10 and EMST exercises with a self-perceived effort level of 8/10.  Patient with intermittent coughing throughout session due to poor management of  secretions that required Mod A verbal cues to self-monitor and correct. Patient left upright in wheelchair with wife present and all needs within reach. Continue with current plan of care.      Pain No/Denies Pain   Therapy/Group: Individual Therapy  Jimmy [name] 04/10/2018, 12:48 PM

## 2018-04-11 ENCOUNTER — Inpatient Hospital Stay (HOSPITAL_COMMUNITY): Payer: Medicare Other | Admitting: Physical Therapy

## 2018-04-11 ENCOUNTER — Inpatient Hospital Stay (HOSPITAL_COMMUNITY): Payer: Medicare Other

## 2018-04-11 ENCOUNTER — Inpatient Hospital Stay (HOSPITAL_COMMUNITY): Payer: Medicare Other | Admitting: Occupational Therapy

## 2018-04-11 DIAGNOSIS — E46 Unspecified protein-calorie malnutrition: Secondary | ICD-10-CM

## 2018-04-11 DIAGNOSIS — N183 Chronic kidney disease, stage 3 (moderate): Secondary | ICD-10-CM

## 2018-04-11 DIAGNOSIS — I69391 Dysphagia following cerebral infarction: Secondary | ICD-10-CM

## 2018-04-11 DIAGNOSIS — D696 Thrombocytopenia, unspecified: Secondary | ICD-10-CM

## 2018-04-11 DIAGNOSIS — E8809 Other disorders of plasma-protein metabolism, not elsewhere classified: Secondary | ICD-10-CM

## 2018-04-11 DIAGNOSIS — I10 Essential (primary) hypertension: Secondary | ICD-10-CM

## 2018-04-11 MED ORDER — PRO-STAT SUGAR FREE PO LIQD
30.0000 mL | Freq: Two times a day (BID) | ORAL | Status: DC
  Administered 2018-04-11 – 2018-04-12 (×2): 30 mL via ORAL
  Filled 2018-04-11 (×2): qty 30

## 2018-04-11 NOTE — Progress Notes (Signed)
Occupational Therapy Session Note  Patient Details  Name: Jimmy [name] MRN: [id number] Date of Birth: 12-23-1928  Today's Date: 04/11/2018 OT Individual Time: 5170-0174 OT Individual Time Calculation (min): 75 min    Short Term Goals: Week 1:  OT Short Term Goal 1 (Week 1): Pt will complete toilet transfer with min A. OT Short Term Goal 2 (Week 1): Pt will weight shift to his L side to cleanse self on toilet with CGA. OT Short Term Goal 3 (Week 1): Pt will bathe with min A. OT Short Term Goal 4 (Week 1): Pt will don shirt with min A. OT Short Term Goal 5 (Week 1): Pt will be able to use LUE as a stabilizing A with donning LB clothing.  Skilled Therapeutic Interventions/Progress Updates:    Patient in bed upon arrival and ready for therapy session.  He denies pain and requests a shower and change of clothes this am.   Functional transfers/bed mobility:  Supine to SSP mod A, SPT to/from bed, w/c, shower bench min/mod A, sit to stand min A Bathing:  Min A  UB dressing:  Mod A LB dressing:  Mod/max A, clothing mgmt Max A in stance with bed rail for support Grooming:  CS/set up assist at sink seated in w/c Completed Left UE AAROM scapula to hand, provided 1/2 lap tray, back support and reviewed posture and limb safety Cues and education completed for left side visual attention. Wife present toward end of session, reviewed progress and activities that they can complete together to promote increased function.  Patient remained in w/c with seat belt alarm set at close of session.  Therapy Documentation Precautions:  Precautions Precautions: Fall Precaution Comments: left hemiparesis, left inattention Restrictions Weight Bearing Restrictions: No General:   Vital Signs:  Pain: Pain Assessment Pain Scale: 0-10 Pain Score: 0-No pain     Therapy/Group: Individual Therapy  Norvin Levering 04/11/2018, 12:19 PM

## 2018-04-11 NOTE — Plan of Care (Signed)
  Problem: Consults Goal: RH STROKE PATIENT EDUCATION Description See Patient Education module for education specifics, pt / fam verb understanding with min A  Outcome: Progressing Goal: Nutrition Consult-if indicated Outcome: Progressing   Problem: RH BOWEL ELIMINATION Goal: RH STG MANAGE BOWEL WITH ASSISTANCE Description STG Manage Bowel with min  Assistance.  Outcome: Progressing Goal: RH STG MANAGE BOWEL W/MEDICATION W/ASSISTANCE Description STG Manage Bowel with Medication with min Assistance.  Outcome: Progressing   Problem: RH BLADDER ELIMINATION Goal: RH STG MANAGE BLADDER WITH ASSISTANCE Description STG Manage Bladder With min Assistance  Outcome: Progressing   Problem: RH SKIN INTEGRITY Goal: RH STG SKIN FREE OF INFECTION/BREAKDOWN Description Pt will have no infection or breakdown on discharge with min A  Outcome: Progressing   Problem: RH SAFETY Goal: RH STG ADHERE TO SAFETY PRECAUTIONS W/ASSISTANCE/DEVICE Description STG Adhere to Safety Precautions With min Assistance/Device.  Outcome: Progressing   Problem: RH PAIN MANAGEMENT Goal: RH STG PAIN MANAGED AT OR BELOW PT'S PAIN GOAL Description Pain <2 with min A  Outcome: Progressing

## 2018-04-11 NOTE — Progress Notes (Signed)
Physical Therapy Session Note  Patient Details  Name: Jimmy [name] MRN: [id number] Date of Birth: 1928/12/22  Today's Date: 04/11/2018 PT Individual Time: 7253-6644 PT Individual Time Calculation (min): 75 min   Short Term Goals: Week 1:  PT Short Term Goal 1 (Week 1): Pt will performed bed<>chair transfers with min assist PT Short Term Goal 2 (Week 1): Pt will performed bed mobility with CGA PT Short Term Goal 3 (Week 1): Pt will ambulate x 50 ft with LRAD and min assist   Skilled Therapeutic Interventions/Progress Updates:   Pt in supine and agreeable to therapy, no c/o pain. Transferred to EOB and to w/c via stand pivot w/ min assist. Total assist w/c transport to/from therapy gym for time management. Worked on gait training, pre-gait and LLE NMR this session. Ambulated 25' w/ RW and LUE orthosis. Mod assist overall w/ frequent verbal cues to engage L quad during single leg stance. Manual facilitation of lateral weight shifting. Pre-gait tasks emphasized L quad activation during single limb stance. R forward and backward stepping on level surface and to 2" step. Performed w/ and w/o UE support to work on postural control and righting reactions. Blocked practiced of sit<>stands from low surface w/o UE support to stabilize in stance. Min assist to facilitate forward trunk lean, equal weight distribution, and for L eccentric quad control. NuStep 10 min @ level 4 for LE strengthening and muscle activation in reciprocal movement pattern. Frequent tactile and verbal cues for neutral LLE alignment. Returned to room via w/c, ended session in w/c w/ all needs in reach.   Nectar thick water provided during 1 rest break. Moderate coughing when pt drinking and talking at the same time, resolved w/ 1-2 strong coughs. Needed almost constant verbal reminders for L head turn and to not talk while drinking and swallowing. Discussed w/ SLP, suspect 2/2 distracting gym environment. Educated pt and wife on  limiting distractions during all eating/drinking.   Therapy Documentation Precautions:  Precautions Precautions: Fall Precaution Comments: left hemiparesis, left inattention Restrictions Weight Bearing Restrictions: No Pain: Pain Assessment Pain Scale: 0-10 Pain Score: 0-No pain  Therapy/Group: Individual Therapy  Jimmy [name] 04/11/2018, 3:19 PM

## 2018-04-11 NOTE — Progress Notes (Signed)
[place] PHYSICAL MEDICINE & REHABILITATION PROGRESS NOTE   Subjective/Complaints: Patient seen laying in bed this morning.  He states he slept well overnight.  He denies complaints.  ROS-denies CP, SOB, N/V/D  Objective:   No results found. No results for input(s): WBC, HGB, HCT, PLT in the last 72 hours. No results for input(s): NA, K, CL, CO2, GLUCOSE, BUN, CREATININE, CALCIUM in the last 72 hours.  Intake/Output Summary (Last 24 hours) at 04/11/2018 0942 Last data filed at 04/11/2018 0916 Gross per 24 hour  Intake 1210 ml  Output -  Net 1210 ml     Physical Exam: Vital Signs Blood pressure (!) 139/53, pulse 68, temperature 98.6 F (37 C), temperature source Oral, resp. rate 18, height 5' 9.5" (1.765 m), weight 76.2 kg, SpO2 93 %.  Physical Exam Constitutional: No distress . Vital signs reviewed. HENT: Normocephalic.  Atraumatic. Eyes: EOMI. No discharge. Cardiovascular: RRR. No JVD.  + Murmur Respiratory: CTA Bilaterally. Normal effort. GI: BS +. Non-distended. Musc: No edema or tenderness in extremities. Neurological:Alert Dysarthria Motor: LUE: 1/5 proximal to distal  LLE: 2+/5 HF, KE and 3/5 ADF/PF.  RUE/RLE grossly 4+/5 proximal to distal Skin: Skin iswarm. He isnot diaphoretic.  Psychiatric: He has anormal mood and affect. Hisbehavior is normal.   Assessment/Plan: 1. Functional deficits secondary to RIght BG ICH which require 3+ hours per day of interdisciplinary therapy in a comprehensive inpatient rehab setting.  Physiatrist is providing close team supervision and 24 hour management of active medical problems listed below.  Physiatrist and rehab team continue to assess barriers to discharge/monitor patient progress toward functional and medical goals  Care Tool:  Bathing    Body parts bathed by patient: Chest, Left arm, Abdomen, Front perineal area, Right upper leg, Left upper leg, Face, Buttocks   Body parts bathed by helper: Right lower  leg, Left lower leg, Right arm     Bathing assist Assist Level: Minimal Assistance - Patient > 75%     Upper Body Dressing/Undressing Upper body dressing   What is the patient wearing?: Pull over shirt    Upper body assist Assist Level: Moderate Assistance - Patient 50 - 74%    Lower Body Dressing/Undressing Lower body dressing      What is the patient wearing?: Underwear/pull up, Pants     Lower body assist Assist for lower body dressing: Moderate Assistance - Patient 50 - 74%     Toileting Toileting    Toileting assist Assist for toileting: Moderate Assistance - Patient 50 - 74%     Transfers Chair/bed transfer  Transfers assist  Chair/bed transfer activity did not occur: Safety/medical concerns  Chair/bed transfer assist level: Maximal Assistance - Patient 25 - 49%     Locomotion Ambulation   Ambulation assist      Assist level: Moderate Assistance - Patient 50 - 74% Assistive device: Other (comment)(none, arm over therapist's shoulder) Max distance: 25'   Walk 10 feet activity   Assist     Assist level: Moderate Assistance - Patient - 50 - 74% Assistive device: Other (comment)(none, arm over therapist's shoulder)   Walk 50 feet activity   Assist Walk 50 feet with 2 turns activity did not occur: Safety/medical concerns         Walk 150 feet activity   Assist Walk 150 feet activity did not occur: Safety/medical concerns         Walk 10 feet on uneven surface  activity   Assist Walk 10 feet on uneven  surfaces activity did not occur: Safety/medical concerns         Wheelchair     Assist Will patient use wheelchair at discharge?: No Type of Wheelchair: Manual    Wheelchair assist level: Minimal Assistance - Patient > 75% Max wheelchair distance: 150'    Wheelchair 50 feet with 2 turns activity    Assist        Assist Level: Minimal Assistance - Patient > 75%   Wheelchair 150 feet activity     Assist      Assist Level: Minimal Assistance - Patient > 75%    Medical Problem List and Plan: 1.  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 DVT Prophylaxis/Anticoagulation: SCDs 3. Pain Management:  Tylenol as needed 4. Mood:  Provide emotional support 5. Neuropsych: This patient is ?  Fully capable of making decisions on his own behalf. 6. Skin/Wound Care:  Routine skin checks 7. Fluids/Electrolytes/Nutrition:  Routine in and out's  8. Dysphagia. Dysphagia #3 neck thick liquids. Follow-up speech therapy  Advance diet as tolerated 9. Hypertension. Lisinopril 2.5 mg (5 mg PTA) daily. Monitor with increased mobility Vitals:   04/11/18 0546 04/11/18 0811  BP: 139/62 (!) 139/53  Pulse: 68   Resp: 18   Temp: 98.6 F (37 C)   SpO2: 93%    Labile on 1/11 10. History of gout. Allopurinol 300 mg daily. 11. History of CAD with CABG. No chest pain or shortness of breath 12. CKD stage III. Baseline creatinine 1.40  Creatinine 1.29 on 1/8  Continue to monitor 13.  Cough- likely multifactorial - no fever , lung exam normal, hx GERD, cont protonix, SLP to work on RMT 14.  Hypoalbuminemia  Supplement initiated on 1/11 15.  Thrombocytopenia  Is 123 on 1/8  Labs ordered for Monday     LOS: 4 days A FACE TO FACE EVALUATION WAS PERFORMED   Lorie Phenix 04/11/2018, 9:42 AM

## 2018-04-11 NOTE — Progress Notes (Signed)
Speech Language Pathology Daily Session Note  Patient Details  Name: Jimmy [name] MRN: [id number] Date of Birth: 22-Jan-1929  Today's Date: 04/11/2018 SLP Individual Time: 1300-1330 SLP Individual Time Calculation (min): 30 min  Short Term Goals: Week 1: SLP Short Term Goal 1 (Week 1): Patient will consume current diet with minimal overt s/s of aspiration and Min A verbal cues for use fo swallowing compensatory strategies.  SLP Short Term Goal 2 (Week 1): Patient will consume trials of thin liquids with a head turn to the left with minimal overt s/s of aspiration over 2 sessions prior to initiating the water protocol.  SLP Short Term Goal 3 (Week 1): Patient will utilize speech intelligibility strategies at the sentence level with supervision verbal cues to maximize intelligibility to 100%.  SLP Short Term Goal 4 (Week 1): Patient will demonstrate selective attention to functional tasks in a minimally distracting enviornment for 10 minutes with Min A verbal cues for redirection.  SLP Short Term Goal 5 (Week 1): Patient will demonstrate functional problem solving for basic and familiar tasks with Mod A verbal cues.   Skilled Therapeutic Interventions:Skilled ST services focused on education and swallow skills. Pt demonstrated basic problem solving skills navigating tray during PO consumption of lunch time meal.  SLP facilitated PO consumption of NTL liquids via cup, pt required mod-min A verbal cues to utilizing head turn to left accurately during swallowing process, in a distracting environment with visitors present. Pt demonstrated x2 coughs during PO consumption. SLP provided education about swallow strategies to pt's wife, she verbalized comprehension, however observe in future session prior to sign off for supervision. Pt was left in room with call bell within reach, visitors in room and bed alarm set. SLP reccomends to continue skilled services.      Pain Pain Assessment Pain Scale:  0-10 Pain Score: 0-No pain  Therapy/Group: Individual Therapy  Lilliona Blakeney  [facility] 04/11/2018, 3:22 PM

## 2018-04-12 ENCOUNTER — Inpatient Hospital Stay (HOSPITAL_COMMUNITY): Payer: Medicare Other | Admitting: Occupational Therapy

## 2018-04-12 DIAGNOSIS — R0989 Other specified symptoms and signs involving the circulatory and respiratory systems: Secondary | ICD-10-CM

## 2018-04-12 NOTE — Progress Notes (Signed)
Occupational Therapy Session Note  Patient Details  Name: Jimmy [name] MRN: [id number] Date of Birth: 1928-08-11  Today's Date: 04/12/2018 OT Group Time: 1100-1200 OT Group Time Calculation (min): 60 min  Skilled Therapeutic Interventions/Progress Updates:    Pt engaged in therapeutic w/c level dance group focusing on patient choice, UE/LE strengthening, salience, activity tolerance, and social participation. Pt was guided through various dance-based exercises involving UEs/LEs and trunk. All music was selected by group members. Emphasis placed on Lt NMR and postural control. Pt utilized active assist techniques for Lt UE/LE with instruction and setup. Spouse and group visitors danced with him while supporting both arms! He was able to remove back from w/c for short windows of time to work on core strengthening. PROM of L UE completed by OT during hand-holding. At end of session pts spouse escorted him back to room.     Therapy Documentation Precautions:  Precautions Precautions: Fall Precaution Comments: left hemiparesis, left inattention Restrictions Weight Bearing Restrictions: No Vital Signs: Therapy Vitals Temp: 98.4 F (36.9 C) Temp Source: Oral Pulse Rate: 75 Resp: 18 BP: (!) 144/66 Patient Position (if appropriate): Lying Oxygen Therapy SpO2: 95 % O2 Device: Room Air Pain: No s/s pain during tx    ADL: ADL Eating: Minimal assistance Grooming: Setup, Supervision/safety Where Assessed-Grooming: Sitting at sink Upper Body Bathing: Minimal assistance Where Assessed-Upper Body Bathing: Shower Lower Body Bathing: Moderate assistance Where Assessed-Lower Body Bathing: Shower Upper Body Dressing: Moderate assistance Where Assessed-Upper Body Dressing: Sitting at sink Lower Body Dressing: Moderate assistance Where Assessed-Lower Body Dressing: Sitting at sink, Standing at sink Toileting: Moderate assistance Where Assessed-Toileting: Glass blower/designer: Moderate  assistance Toilet Transfer Method: Stand pivot Science writer: Energy manager: Minimal assistance, Maximal cueing Social research officer, government Method: Radiographer, therapeutic: Grab bars, Transfer tub bench     Therapy/Group: Group Therapy  [employer] 04/12/2018, 3:51 PM

## 2018-04-12 NOTE — Progress Notes (Addendum)
[place] PHYSICAL MEDICINE & REHABILITATION PROGRESS NOTE   Subjective/Complaints: Patient seen laying in bed this morning.  He states he slept well overnight.  He denies complaints.  ROS: Denies CP, SOB, N/V/D  Objective:   No results found. No results for input(s): WBC, HGB, HCT, PLT in the last 72 hours. No results for input(s): NA, K, CL, CO2, GLUCOSE, BUN, CREATININE, CALCIUM in the last 72 hours.  Intake/Output Summary (Last 24 hours) at 04/12/2018 0758 Last data filed at 04/12/2018 0550 Gross per 24 hour  Intake 1320 ml  Output 700 ml  Net 620 ml     Physical Exam: Vital Signs Blood pressure (!) 155/67, pulse 60, temperature 100.1 F (37.8 C), temperature source Oral, resp. rate 16, height 5' 9.5" (1.765 m), weight 76.2 kg, SpO2 94 %.  Physical Exam Constitutional: No distress . Vital signs reviewed. HENT: Normocephalic.  Atraumatic. Eyes: EOMI. No discharge. Cardiovascular: RRR. No JVD.  + Murmur Respiratory: CTA bilaterally. Normal effort. GI: BS +. Non-distended. Musc: No edema or tenderness in extremities. Neurological:Alert Dysarthria Motor: LUE: 1+/5 proximal to distal  LLE: 2+/5 HF, KE and 3/5 ADF/PF.  RUE/RLE grossly 4+/5 proximal to distal, stable Skin: Skin iswarm. He isnot diaphoretic.  Psychiatric: He has anormal mood and affect. Hisbehavior is normal.   Assessment/Plan: 1. Functional deficits secondary to RIght BG ICH which require 3+ hours per day of interdisciplinary therapy in a comprehensive inpatient rehab setting.  Physiatrist is providing close team supervision and 24 hour management of active medical problems listed below.  Physiatrist and rehab team continue to assess barriers to discharge/monitor patient progress toward functional and medical goals  Care Tool:  Bathing    Body parts bathed by patient: Chest, Left arm, Abdomen, Front perineal area, Right upper leg, Left upper leg, Face, Buttocks   Body parts bathed by  helper: Right lower leg, Left lower leg, Right arm     Bathing assist Assist Level: Minimal Assistance - Patient > 75%     Upper Body Dressing/Undressing Upper body dressing   What is the patient wearing?: Pull over shirt    Upper body assist Assist Level: Moderate Assistance - Patient 50 - 74%    Lower Body Dressing/Undressing Lower body dressing      What is the patient wearing?: Pants, Incontinence brief     Lower body assist Assist for lower body dressing: Moderate Assistance - Patient 50 - 74%     Toileting Toileting    Toileting assist Assist for toileting: Moderate Assistance - Patient 50 - 74%     Transfers Chair/bed transfer  Transfers assist  Chair/bed transfer activity did not occur: Safety/medical concerns  Chair/bed transfer assist level: Minimal Assistance - Patient > 75%     Locomotion Ambulation   Ambulation assist      Assist level: Moderate Assistance - Patient 50 - 74% Assistive device: Walker-rolling Max distance: 25'   Walk 10 feet activity   Assist     Assist level: Moderate Assistance - Patient - 50 - 74% Assistive device: Walker-rolling   Walk 50 feet activity   Assist Walk 50 feet with 2 turns activity did not occur: Safety/medical concerns         Walk 150 feet activity   Assist Walk 150 feet activity did not occur: Safety/medical concerns         Walk 10 feet on uneven surface  activity   Assist Walk 10 feet on uneven surfaces activity did not occur: Safety/medical concerns  Wheelchair     Assist Will patient use wheelchair at discharge?: No Type of Wheelchair: Manual    Wheelchair assist level: Minimal Assistance - Patient > 75% Max wheelchair distance: 150'    Wheelchair 50 feet with 2 turns activity    Assist        Assist Level: Minimal Assistance - Patient > 75%   Wheelchair 150 feet activity     Assist     Assist Level: Minimal Assistance - Patient > 75%     Medical Problem List and Plan: 1.  Left side weakness with dysarthria and dysphagia secondary to acute right basal ganglia hemorrhage secondary to hypertensive crisis   Cont CIR 2. DVT Prophylaxis/Anticoagulation: SCDs 3. Pain Management:  Tylenol as needed 4. Mood:  Provide emotional support 5. Neuropsych: This patient is ?  Fully capable of making decisions on his own behalf. 6. Skin/Wound Care:  Routine skin checks 7. Fluids/Electrolytes/Nutrition:  Routine in and out's  8. Dysphagia. Dysphagia #3 neck thick liquids. Follow-up speech therapy  Advance diet as tolerated 9. Hypertension. Lisinopril 2.5 mg (5 mg PTA) daily. Monitor with increased mobility Vitals:   04/11/18 1956 04/12/18 0347  BP: 125/63 (!) 155/67  Pulse: 63 60  Resp: 18 16  Temp: 99.6 F (37.6 C) 100.1 F (37.8 C)  SpO2: 95% 94%   Labile on 1/12, monitor for trend 10. History of gout. Allopurinol 300 mg daily. 11. History of CAD with CABG. No chest pain or shortness of breath 12. CKD stage III. Baseline creatinine 1.40  Creatinine 1.29 on 1/8  Continue to monitor 13.  Cough- likely multifactorial - no fever , lung exam normal, hx GERD, cont protonix, SLP to work on RMT 14.  Hypoalbuminemia  Supplement initiated on 1/11 15.  Thrombocytopenia  Is 123 on 1/8  Labs ordered for tomorrow     LOS: 5 days A FACE TO FACE EVALUATION WAS PERFORMED  Weston Fulco Lorie Phenix 04/12/2018, 7:58 AM

## 2018-04-12 NOTE — Progress Notes (Signed)
Continent and incontinent of urine. Uses the urinal with assist with placing and emptying. PRN tylenol given at 2000, per patient's request. Foam dressing to sacrum. Patrici Ranks A

## 2018-04-13 ENCOUNTER — Inpatient Hospital Stay (HOSPITAL_COMMUNITY): Payer: Medicare Other | Admitting: Speech Pathology

## 2018-04-13 ENCOUNTER — Inpatient Hospital Stay (HOSPITAL_COMMUNITY): Payer: Medicare Other

## 2018-04-13 LAB — CBC WITH DIFFERENTIAL/PLATELET
Abs Immature Granulocytes: 0.03 10*3/uL (ref 0.00–0.07)
Basophils Absolute: 0 10*3/uL (ref 0.0–0.1)
Basophils Relative: 1 %
Eosinophils Absolute: 0.2 10*3/uL (ref 0.0–0.5)
Eosinophils Relative: 2 %
HCT: 43.4 % (ref 39.0–52.0)
Hemoglobin: 14.5 g/dL (ref 13.0–17.0)
Immature Granulocytes: 0 %
Lymphocytes Relative: 14 %
Lymphs Abs: 1.1 10*3/uL (ref 0.7–4.0)
MCH: 32 pg (ref 26.0–34.0)
MCHC: 33.4 g/dL (ref 30.0–36.0)
MCV: 95.8 fL (ref 80.0–100.0)
Monocytes Absolute: 0.7 10*3/uL (ref 0.1–1.0)
Monocytes Relative: 9 %
Neutro Abs: 5.8 10*3/uL (ref 1.7–7.7)
Neutrophils Relative %: 74 %
Platelets: 163 10*3/uL (ref 150–400)
RBC: 4.53 MIL/uL (ref 4.22–5.81)
RDW: 12.3 % (ref 11.5–15.5)
WBC: 7.8 10*3/uL (ref 4.0–10.5)
nRBC: 0 % (ref 0.0–0.2)

## 2018-04-13 NOTE — Progress Notes (Signed)
Occupational Therapy Session Note  Patient Details  Name: Jimmy [name] MRN: [id number] Date of Birth: 1928-07-03  Today's Date: 04/13/2018 OT Individual Time: 1100-1200 OT Individual Time Calculation (min): 60 min    Short Term Goals: Week 1:  OT Short Term Goal 1 (Week 1): Pt will complete toilet transfer with min A. OT Short Term Goal 2 (Week 1): Pt will weight shift to his L side to cleanse self on toilet with CGA. OT Short Term Goal 3 (Week 1): Pt will bathe with min A. OT Short Term Goal 4 (Week 1): Pt will don shirt with min A. OT Short Term Goal 5 (Week 1): Pt will be able to use LUE as a stabilizing A with donning LB clothing.  Skilled Therapeutic Interventions/Progress Updates:    Pt resting in w/c upon arrival and agreeable to participating in therapy. OT intervention with focus on BADL retraining, sitting balance, sit<>stand, standing balance, attention to L, LUE forced use, safety awareness, and activity tolerance to increase independence with BADLs. See below for assist levels for bathing/dressing. Pt continues to exhibit significant lean to his L during sitting and standing activities but can self correct with verbal cues.  Pt requires max verbal cues.  Pt is easily distracted and requires max verbal cues to redirect to task.  Pt requires max verbal cues for safety awareness when engaged in functional tasks.  Wife present to observe.  Discussed importance of attention to task and focus on task.  Pt remained in w/c with wife present and belt alarm activated. All needs within reach.   Therapy Documentation Precautions:  Precautions Precautions: Fall Precaution Comments: left hemiparesis, left inattention Restrictions Weight Bearing Restrictions: No  Pain:  Pt denies pain ADL: ADL Eating: Minimal assistance Grooming: Setup, Supervision/safety Where Assessed-Grooming: Sitting at sink Upper Body Bathing: Minimal assistance Where Assessed-Upper Body Bathing:  Shower Lower Body Bathing: Minimal assistance Where Assessed-Lower Body Bathing: Shower Upper Body Dressing: Moderate assistance Where Assessed-Upper Body Dressing: Sitting at sink Lower Body Dressing: Moderate assistance Where Assessed-Lower Body Dressing: Sitting at sink, Standing at sink [employer]: Minimal assistance, Maximal cueing Social research officer, government Method: Stand pivot [employer]: Grab bars, Transfer tub bench   Therapy/Group: Individual Therapy  [name] 04/13/2018, 12:13 PM

## 2018-04-13 NOTE — Progress Notes (Signed)
Pt's BP was lower than usual at 101/57 on dinamap, checked manually at 90/50.  Pt is asymptomatic and starting PT now, Marlowe Sh[place], PA notified, will continue to monitor bp.

## 2018-04-13 NOTE — Progress Notes (Signed)
Physical Therapy Session Note  Patient Details  Name: Jimmy [name] MRN: [id number] Date of Birth: March 04, 1929  Today's Date: 04/13/2018 PT Individual Time: 0905-1000 and 1545-1555 PT Individual Time Calculation (min): 55 min and 70 min  Short Term Goals: Week 1:  PT Short Term Goal 1 (Week 1): Pt will performed bed<>chair transfers with min assist PT Short Term Goal 2 (Week 1): Pt will performed bed mobility with CGA PT Short Term Goal 3 (Week 1): Pt will ambulate x 50 ft with LRAD and min assist   Skilled Therapeutic Interventions/Progress Updates:    Session 1: Pt supine in bed upon PT arrival, agreeable to therapy tx and denies pain. Pt transferred from supine>sitting EOB with min assist, while seated EOB with SBA therapist donned shoes. Pt with x 1 posterior LOB while sitting requiring max assist to correct. Pt performed stand pivot to w/c with mod assist and transported to the gym. Pt performed stand pivot to mat with min assist. Pt worked on sitting balance to correct left lateral lean, using mirror for visual feedback pt performed reaching task to grasp cups from overhead and then place on the floor on R side. Pt performed sit<>sidelying on elbow x 5 in both directions working on postural control and core strengthening. Therapist performed hamstring stretch 2 x 30 sec bilaterally. Pt performed standing on red wedge for heel cord and hamstring stretching, mod assist for balance. With mirror for visual feedback pt worked on dynamic standing balance to perform reaching task while maintaining midline, min assist with max verbal cueing for attention to task. Pt worked on pre-gait stepping in place without AD, mod assist and verbal cues for techniques. Pt transported back to room and left seated with needs in reach and chair alarm set.   Session 2: Pt seated in w/c upon PT arrival, agreeable to therapy tx and denies pain. RN reports that pt's has had low blood pressure but is not symptomatic.  Therapist donned teds and shoes. Pt transported to the gym. BP monitored 125/59. Pt performed blocked practice of squat pivot transfers from w/c<>mat x 4 in each direction with min assist, verbal cues for techniques and emphasis on maintaining anterior weightshift. Pt performed sit<>stands x 10 this session with emphasis on symmetric LE weightbearing and maintaining midline. Pt worked on pre-gait stepping in place without UE support, Geologist, engineering for visual feedback and mod assist, tactile cues/facilitation for L knee control during stance and verbal cues for left foot placement during swing. Pt ambulated x 25 ft with RW and mod assist, shoe cover for improved L LE swing through and pt requiring assist to advance L LE 50% of the time, verbal cues for L knee extension during stance. Pt participated in gait training at the rail, emphasis on L step through and foot clearance as well as L LE stance control 3 x 25 ft forwards ambulation and x 25 ft backwards ambulation with R rail UE support. Pt worked on L hip/knee flexion for swing phase to kick ball while standing in place x 10 kicks. Pt worked on L stance control while kicking ball with R LE x 10 kicks. Pt transported back to room and transferred to recliner min assist, left with needs in reach and chair alarm set.    Therapy Documentation Precautions:  Precautions Precautions: Fall Precaution Comments: left hemiparesis, left inattention Restrictions Weight Bearing Restrictions: No    Therapy/Group: Individual Therapy  Netta Corrigan, PT, DPT 04/13/2018, 7:47 AM

## 2018-04-13 NOTE — Progress Notes (Signed)
Speech Language Pathology Daily Session Note  Patient Details  Name: Jimmy [name] MRN: [id number] Date of Birth: [birth year]  Today's Date: 04/13/2018 SLP Individual Time: 1245-1315 SLP Individual Time Calculation (min): 30 min  Short Term Goals: Week 1: SLP Short Term Goal 1 (Week 1): Patient will consume current diet with minimal overt s/s of aspiration and Min A verbal cues for use fo swallowing compensatory strategies.  SLP Short Term Goal 2 (Week 1): Patient will consume trials of thin liquids with a head turn to the left with minimal overt s/s of aspiration over 2 sessions prior to initiating the water protocol.  SLP Short Term Goal 3 (Week 1): Patient will utilize speech intelligibility strategies at the sentence level with supervision verbal cues to maximize intelligibility to 100%.  SLP Short Term Goal 4 (Week 1): Patient will demonstrate selective attention to functional tasks in a minimally distracting enviornment for 10 minutes with Min A verbal cues for redirection.  SLP Short Term Goal 5 (Week 1): Patient will demonstrate functional problem solving for basic and familiar tasks with Mod A verbal cues.   Skilled Therapeutic Interventions: Skilled treatment session focused on dysphagia and speech goals. SLP facilitated session by providing Mod A verbal and visual cues for use of swallowing compensatory strategies with lunch meal of Dys. 3 textures with nectar-thick liquids. Patient consumed meal with intermittent overt s/s of aspiration, suspect due to decreased use of strategies when environment became minimally distracting. SLP also facilitated session by providing Min A verbal and tactile cues (to maintain labial seal) to perform 25 repetitions of EMST exercises accurately. Patient's wife present throughout session and signed off to provide supervision during meals due to providing appropriate cues. Patient left upright in wheelchair with wife present. Continue with current plan of  care.      Pain Pain Assessment Pain Scale: 0-10 Pain Score: 0-No pain Faces Pain Scale: No hurt  Therapy/Group: Individual Therapy  Taina Landry 04/13/2018, 1:19 PM

## 2018-04-13 NOTE — Progress Notes (Signed)
[place] PHYSICAL MEDICINE & REHABILITATION PROGRESS NOTE   Subjective/Complaints:  Pt watched football game last noc, remembered team and point spread ROS: Denies CP, SOB, N/V/D  Objective:   No results found. No results for input(s): WBC, HGB, HCT, PLT in the last 72 hours. No results for input(s): NA, K, CL, CO2, GLUCOSE, BUN, CREATININE, CALCIUM in the last 72 hours.  Intake/Output Summary (Last 24 hours) at 04/13/2018 0753 Last data filed at 04/12/2018 2300 Gross per 24 hour  Intake 920 ml  Output -  Net 920 ml     Physical Exam: Vital Signs Blood pressure (!) 160/67, pulse (!) 59, temperature (!) 97.4 F (36.3 C), temperature source Oral, resp. rate 17, height 5' 9.5" (1.765 m), weight 76.2 kg, SpO2 94 %.  Physical Exam Constitutional: No distress . Vital signs reviewed. HENT: Normocephalic.  Atraumatic. Eyes: EOMI. No discharge. Cardiovascular: RRR. No JVD.  + Murmur Respiratory: CTA bilaterally. Normal effort. GI: BS +. Non-distended. Musc: No edema or tenderness in extremities. Neurological:Alert Dysarthria Motor: LUE: 1+/5 proximal to distal  LLE: 2+/5 HF, KE and 3/5 ADF/PF.  RUE/RLE grossly 4+/5 proximal to distal, stable Skin: Skin iswarm. He isnot diaphoretic.  Psychiatric: He has anormal mood and affect. Hisbehavior is normal.   Assessment/Plan: 1. Functional deficits secondary to RIght BG ICH which require 3+ hours per day of interdisciplinary therapy in a comprehensive inpatient rehab setting.  Physiatrist is providing close team supervision and 24 hour management of active medical problems listed below.  Physiatrist and rehab team continue to assess barriers to discharge/monitor patient progress toward functional and medical goals  Care Tool:  Bathing    Body parts bathed by patient: Chest, Left arm, Abdomen, Front perineal area, Right upper leg, Left upper leg, Face, Buttocks   Body parts bathed by helper: Right lower leg, Left lower  leg, Right arm     Bathing assist Assist Level: Minimal Assistance - Patient > 75%     Upper Body Dressing/Undressing Upper body dressing   What is the patient wearing?: Pull over shirt    Upper body assist Assist Level: Moderate Assistance - Patient 50 - 74%    Lower Body Dressing/Undressing Lower body dressing      What is the patient wearing?: Pants, Incontinence brief     Lower body assist Assist for lower body dressing: Moderate Assistance - Patient 50 - 74%     Toileting Toileting    Toileting assist Assist for toileting: Moderate Assistance - Patient 50 - 74%     Transfers Chair/bed transfer  Transfers assist  Chair/bed transfer activity did not occur: Safety/medical concerns  Chair/bed transfer assist level: Minimal Assistance - Patient > 75%     Locomotion Ambulation   Ambulation assist      Assist level: Moderate Assistance - Patient 50 - 74% Assistive device: Walker-rolling Max distance: 25'   Walk 10 feet activity   Assist     Assist level: Moderate Assistance - Patient - 50 - 74% Assistive device: Walker-rolling   Walk 50 feet activity   Assist Walk 50 feet with 2 turns activity did not occur: Safety/medical concerns         Walk 150 feet activity   Assist Walk 150 feet activity did not occur: Safety/medical concerns         Walk 10 feet on uneven surface  activity   Assist Walk 10 feet on uneven surfaces activity did not occur: Safety/medical concerns  Wheelchair     Assist Will patient use wheelchair at discharge?: No Type of Wheelchair: Manual    Wheelchair assist level: Minimal Assistance - Patient > 75% Max wheelchair distance: 150'    Wheelchair 50 feet with 2 turns activity    Assist        Assist Level: Minimal Assistance - Patient > 75%   Wheelchair 150 feet activity     Assist     Assist Level: Minimal Assistance - Patient > 75%    Medical Problem List and Plan: 1.   Left side weakness with dysarthria and dysphagia secondary to acute right basal ganglia hemorrhage secondary to hypertensive crisis   Cont CIR PT, OT, SLP 2. DVT Prophylaxis/Anticoagulation: SCDs 3. Pain Management:  Tylenol as needed 4. Mood:  Provide emotional support 5. Neuropsych: This patient is ?  Fully capable of making decisions on his own behalf. 6. Skin/Wound Care:  Routine skin checks 7. Fluids/Electrolytes/Nutrition:  Routine in and out's  8. Dysphagia. Dysphagia #3 neck thick liquids. Follow-up speech therapy  Advance diet as tolerated 9. Hypertension. Lisinopril 2.5 mg (5 mg PTA) daily. Monitor with increased mobility Vitals:   04/12/18 2006 04/13/18 0426  BP: (!) 136/58 (!) 160/67  Pulse: (!) 59 (!) 59  Resp: 18 17  Temp: 97.6 F (36.4 C) (!) 97.4 F (36.3 C)  SpO2: 94% 94%   Labile on 1/13, monitor for trend 10. History of gout. Allopurinol 300 mg daily. 11. History of CAD with CABG. No chest pain or shortness of breath 12. CKD stage III. Baseline creatinine 1.40  Creatinine 1.29 on 1/8  Continue to monitor 13.  Cough- likely multifactorial - no fever , lung exam normal, hx GERD, cont protonix, SLP to work on RMT 14.  Hypoalbuminemia  Supplement initiated on 1/11 15.  Thrombocytopenia  Is 123 on 1/8  CBC today     LOS: 6 days A FACE TO FACE EVALUATION WAS PERFORMED  Charlett Blake 04/13/2018, 7:52 AM

## 2018-04-14 ENCOUNTER — Inpatient Hospital Stay (HOSPITAL_COMMUNITY): Payer: Medicare Other | Admitting: Physical Therapy

## 2018-04-14 ENCOUNTER — Inpatient Hospital Stay (HOSPITAL_COMMUNITY): Payer: Medicare Other | Admitting: Speech Pathology

## 2018-04-14 ENCOUNTER — Inpatient Hospital Stay (HOSPITAL_COMMUNITY): Payer: Medicare Other

## 2018-04-14 LAB — GLUCOSE, CAPILLARY: Glucose-Capillary: 91 mg/dL (ref 70–99)

## 2018-04-14 NOTE — Progress Notes (Signed)
Physical Therapy Session Note  Patient Details  Name: Jimmy [name] MRN: [id number] Date of Birth: Feb 02, 1929  Today's Date: 04/14/2018 PT Individual Time: 1435-1535 PT Individual Time Calculation (min): 60 min   Short Term Goals: Week 1:  PT Short Term Goal 1 (Week 1): Pt will performed bed<>chair transfers with min assist PT Short Term Goal 2 (Week 1): Pt will performed bed mobility with CGA PT Short Term Goal 3 (Week 1): Pt will ambulate x 50 ft with LRAD and min assist   Skilled Therapeutic Interventions/Progress Updates: Pt presented in bed sleeping but easily awoken and agreeable to therapy, denies pain. Performed supine to sit to R minA with use of bed features. Performed squat pivot transfer to w/c modA to L. After set up pt propelled to rehab gym minA with manual facilitation of RLE management to increase pressure in R foot to maintain straight trajectory. Performed squat pivot to R minA. Use of mirror feedback to perform STS with RW minA with pt requiring tactile cues for midline orientation and increasing L knee extension. Performed sitting balance on rocker board with mirror feedback for facilitation of midline orientation and elongation of L trunk. Performed reaching with RUE to R while on rocker board with minA fading to [place]. Performed standing balance reaching and placing clothes pins on basketball net for standing tolerance with pt requiring mod cues for increasing wt on LLE and activating L quad. Pt able to perform full standing without activity however required mod multimodal cues for improved midline once engaged with clothespins. Participated in gait training 51f with RW and L toe cap with modA initially fading to minA with mod multimodal cues for staying within RW (requring PTA to block RW from further advancement), sequencing, and improving wt shifting with step. Pt returned to w/c via squat pivot, transported to day room to participate in Cybex Kintron 2 bouts x 3 min 70cm/sec  and 60 cm/sec respectively for reciprocal activity and endurance. Pt transported back to room at end of session and remained in w/c with belt alarm in place, call bell within reach and needs met.      Therapy Documentation Precautions:  Precautions Precautions: Fall Precaution Comments: left hemiparesis, left inattention Restrictions Weight Bearing Restrictions: No General:   Vital Signs: Therapy Vitals Temp: 97.8 F (36.6 C) Temp Source: Oral Pulse Rate: 64 Resp: 18 BP: 110/61 Patient Position (if appropriate): Lying Oxygen Therapy SpO2: 100 % O2 Device: Room Air    Therapy/Group: Individual Therapy  Mohid Furuya  Shaunak Kreis, PTA  04/14/2018, 3:44 PM

## 2018-04-14 NOTE — Progress Notes (Signed)
Occupational Therapy Session Note  Patient Details  Name: Jimmy [name] MRN: [id number] Date of Birth: 24-Mar-1929  Today's Date: 04/14/2018 OT Individual Time: 0945-1100 OT Individual Time Calculation (min): 75 min    Short Term Goals: Week 1:  OT Short Term Goal 1 (Week 1): Pt will complete toilet transfer with min A. OT Short Term Goal 2 (Week 1): Pt will weight shift to his L side to cleanse self on toilet with CGA. OT Short Term Goal 3 (Week 1): Pt will bathe with min A. OT Short Term Goal 4 (Week 1): Pt will don shirt with min A. OT Short Term Goal 5 (Week 1): Pt will be able to use LUE as a stabilizing A with donning LB clothing.  Skilled Therapeutic Interventions/Progress Updates:    OT intervention with focus on BADL retraining (see below for assist), functional transfers, sit<>stand, standing balance, attention to L, LUE forced use, sitting balance, safety awareness, and activity tolerance to increase independence with BADLs.  Pt engaged in bathing at shower level and dressing with sit<>stand from w/c at sink.  Pt requires min A for stand pivot transfers with grab bars.  Pt continues to exhibit L lean when engaged in functional tasks (sitting or standing), although improved from previous day.  Pt initiating hemi dressing techniques but requires assistance to complete. Pt continues to require max verbal cues for safety to correct L lean when engaged in functional tasks.  Pt remained in w/c with all needs within reach and belt alarm activated.   Therapy Documentation Precautions:  Precautions Precautions: Fall Precaution Comments: left hemiparesis, left inattention Restrictions Weight Bearing Restrictions: No  Pain:  Pt denies pain ADL: ADL Eating: Minimal assistance Grooming: Setup, Supervision/safety Where Assessed-Grooming: Sitting at sink Upper Body Bathing: Minimal assistance, Maximal cueing Where Assessed-Upper Body Bathing: Shower Lower Body Bathing: Minimal  assistance, Maximal cueing Where Assessed-Lower Body Bathing: Shower Upper Body Dressing: Minimal assistance Where Assessed-Upper Body Dressing: Sitting at sink Lower Body Dressing: Moderate assistance Where Assessed-Lower Body Dressing: Sitting at sink, Standing at sink [employer]: Minimal assistance, Maximal cueing Social research officer, government Method: Stand pivot [employer]: Grab bars, Transfer tub bench   Therapy/Group: Individual Therapy  [name] 04/14/2018, 12:21 PM

## 2018-04-14 NOTE — Progress Notes (Signed)
[place] PHYSICAL MEDICINE & REHABILITATION PROGRESS NOTE   Subjective/Complaints:  No issues overnite , needs full Sup for feeding  ROS: Denies CP, SOB, N/V/D  Objective:   No results found. Recent Labs    04/13/18 1358  WBC 7.8  HGB 14.5  HCT 43.4  PLT 163   No results for input(s): NA, K, CL, CO2, GLUCOSE, BUN, CREATININE, CALCIUM in the last 72 hours.  Intake/Output Summary (Last 24 hours) at 04/14/2018 0741 Last data filed at 04/13/2018 2240 Gross per 24 hour  Intake 480 ml  Output 750 ml  Net -270 ml     Physical Exam: Vital Signs Blood pressure (!) 118/93, pulse 65, temperature 98.6 F (37 C), temperature source Oral, resp. rate 18, height 5' 9.5" (1.765 m), weight 76.2 kg, SpO2 96 %.  Physical Exam Constitutional: No distress . Vital signs reviewed. HENT: Normocephalic.  Atraumatic. Eyes: EOMI. No discharge. Cardiovascular: RRR. No JVD.  + Murmur Respiratory: CTA bilaterally. Normal effort. GI: BS +. Non-distended. Musc: No edema or tenderness in extremities. Neurological:Alert Dysarthria Motor: LUE: 1+/5 proximal to distal  LLE: 2+/5 HF, KE and 3/5 ADF/PF.  RUE/RLE grossly 4+/5 proximal to distal, stable Skin: Skin iswarm. He isnot diaphoretic.  Psychiatric: He has anormal mood and affect. Hisbehavior is normal.   Assessment/Plan: 1. Functional deficits secondary to RIght BG ICH which require 3+ hours per day of interdisciplinary therapy in a comprehensive inpatient rehab setting.  Physiatrist is providing close team supervision and 24 hour management of active medical problems listed below.  Physiatrist and rehab team continue to assess barriers to discharge/monitor patient progress toward functional and medical goals  Care Tool:  Bathing    Body parts bathed by patient: Chest, Left arm, Abdomen, Front perineal area, Right upper leg, Left upper leg, Face, Buttocks   Body parts bathed by helper: Right lower leg, Left lower leg, Right  arm     Bathing assist Assist Level: Minimal Assistance - Patient > 75%     Upper Body Dressing/Undressing Upper body dressing   What is the patient wearing?: Pull over shirt    Upper body assist Assist Level: Moderate Assistance - Patient 50 - 74%    Lower Body Dressing/Undressing Lower body dressing      What is the patient wearing?: Underwear/pull up, Pants     Lower body assist Assist for lower body dressing: Moderate Assistance - Patient 50 - 74%     Toileting Toileting    Toileting assist Assist for toileting: Moderate Assistance - Patient 50 - 74%     Transfers Chair/bed transfer  Transfers assist  Chair/bed transfer activity did not occur: Safety/medical concerns  Chair/bed transfer assist level: Moderate Assistance - Patient 50 - 74%     Locomotion Ambulation   Ambulation assist      Assist level: Moderate Assistance - Patient 50 - 74% Assistive device: Walker-rolling Max distance: 25'   Walk 10 feet activity   Assist     Assist level: Moderate Assistance - Patient - 50 - 74% Assistive device: Walker-rolling   Walk 50 feet activity   Assist Walk 50 feet with 2 turns activity did not occur: Safety/medical concerns         Walk 150 feet activity   Assist Walk 150 feet activity did not occur: Safety/medical concerns         Walk 10 feet on uneven surface  activity   Assist Walk 10 feet on uneven surfaces activity did not occur: Safety/medical concerns  Wheelchair     Assist Will patient use wheelchair at discharge?: No Type of Wheelchair: Manual    Wheelchair assist level: Minimal Assistance - Patient > 75% Max wheelchair distance: 150'    Wheelchair 50 feet with 2 turns activity    Assist        Assist Level: Minimal Assistance - Patient > 75%   Wheelchair 150 feet activity     Assist     Assist Level: Minimal Assistance - Patient > 75%    Medical Problem List and Plan: 1.  Left  side weakness with dysarthria and dysphagia secondary to acute right basal ganglia hemorrhage secondary to hypertensive crisis   Cont CIR PT, OT, SLP 2. DVT Prophylaxis/ No Anticoagulation: SCDs 3. Pain Management:  Tylenol as needed 4. Mood:  Provide emotional support 5. Neuropsych: This patient is ?  Fully capable of making decisions on his own behalf. 6. Skin/Wound Care:  Routine skin checks 7. Fluids/Electrolytes/Nutrition:  Routine in and out's , I 432ml                                    8. Dysphagia. Dysphagia #3 neck thick liquids. Follow-up speech therapy  Advance diet as tolerated 9. Hypertension. Lisinopril 2.5 mg (5 mg PTA) daily. Monitor with increased mobility Vitals:   04/13/18 1916 04/14/18 0456  BP: 107/74 (!) 118/93  Pulse: 68 65  Resp: 18 18  Temp: 98 F (36.7 C) 98.6 F (37 C)  SpO2: 96% 96%   Normal BPs but has been a little soft on 1/13- cont to monitor 10. History of gout. Allopurinol 300 mg daily. 11. History of CAD with CABG. No chest pain or shortness of breath 12. CKD stage III. Baseline creatinine 1.40  Creatinine 1.29 on 1/8  Continue to monitor 13.  Cough- likely multifactorial - no fever , lung exam normal, hx GERD, cont protonix, SLP to work on RMT 14.  Hypoalbuminemia  Supplement initiated on 1/11 15.  Thrombocytopenia  Is 123 on 1/8  CBC 1/13 plt nl     LOS: 7 days A FACE TO FACE EVALUATION WAS PERFORMED  Charlett Blake 04/14/2018, 7:41 AM

## 2018-04-14 NOTE — Progress Notes (Signed)
Physical Therapy Session Note  Patient Details  Name: Jimmy [name] MRN: [id number] Date of Birth: [birth year]  Today's Date: 04/14/2018 PT Individual Time: 1600-1630 PT Individual Time Calculation (min): 30 min   Short Term Goals: Week 1:  PT Short Term Goal 1 (Week 1): Pt will performed bed<>chair transfers with min assist PT Short Term Goal 2 (Week 1): Pt will performed bed mobility with CGA PT Short Term Goal 3 (Week 1): Pt will ambulate x 50 ft with LRAD and min assist   Skilled Therapeutic Interventions/Progress Updates:   Pt in w/c and agreeable to therapy, no c/o pain. Total assist w/c transport to/from day room for time management. NuStep 7 min @ level 5 w/ BLEs and RUE to work on LLE muscle activation and L body awareness. Frequent verbal and tactile cues for neutral LLE alignment. Worked on standing postural control w/o UE support while standing at high/low table. Performed unilateral UE cognitive task, CGA for balance and mod-max tactile/verbal cues for L knee extension w/ dual tasking. Pt w/ L lateral and forward lean when performing dual task. Returned to room and ended session in w/c, all needs in reach.  Therapy Documentation Precautions:  Precautions Precautions: Fall Precaution Comments: left hemiparesis, left inattention Restrictions Weight Bearing Restrictions: No Vital Signs: Therapy Vitals Temp: 97.8 F (36.6 C) Temp Source: Oral Pulse Rate: 64 Resp: 18 BP: 110/61 Patient Position (if appropriate): Lying Oxygen Therapy SpO2: 100 % O2 Device: Room Air  Therapy/Group: Individual Therapy  [name] Clent Demark 04/14/2018, 4:46 PM

## 2018-04-14 NOTE — Progress Notes (Signed)
Speech Language Pathology Weekly Progress and Session Note  Patient Details  Name: Jimmy [name] MRN: [id number] Date of Birth: October 07, 1928  Beginning of progress report period: April 07, 2018 End of progress report period: April 14, 2018  Today's Date: 04/14/2018 SLP Individual Time: 1130-1200 SLP Individual Time Calculation (min): 30 min  Short Term Goals: Week 1: SLP Short Term Goal 1 (Week 1): Patient will consume current diet with minimal overt s/s of aspiration and Min A verbal cues for use fo swallowing compensatory strategies.  SLP Short Term Goal 1 - Progress (Week 1): Not met SLP Short Term Goal 2 (Week 1): Patient will consume trials of thin liquids with a head turn to the left with minimal overt s/s of aspiration over 2 sessions prior to initiating the water protocol.  SLP Short Term Goal 2 - Progress (Week 1): Not met SLP Short Term Goal 3 (Week 1): Patient will utilize speech intelligibility strategies at the sentence level with supervision verbal cues to maximize intelligibility to 100%.  SLP Short Term Goal 3 - Progress (Week 1): Met SLP Short Term Goal 4 (Week 1): Patient will demonstrate selective attention to functional tasks in a minimally distracting enviornment for 10 minutes with Min A verbal cues for redirection.  SLP Short Term Goal 4 - Progress (Week 1): Not met SLP Short Term Goal 5 (Week 1): Patient will demonstrate functional problem solving for basic and familiar tasks with Mod A verbal cues.  SLP Short Term Goal 5 - Progress (Week 1): Not met    New Short Term Goals: Week 2: SLP Short Term Goal 1 (Week 2): Patient will consume current diet with minimal overt s/s of aspiration and Min A verbal cues for use fo swallowing compensatory strategies.  SLP Short Term Goal 2 (Week 2): Patient will consume trials of thin liquids with a head turn to the left with minimal overt s/s of aspiration over 2 sessions prior to initiating the water protocol.  SLP Short Term  Goal 3 (Week 2): Patient will demonstrate selective attention to functional tasks in a minimally distracting enviornment for 10 minutes with Min A verbal cues for redirection.  SLP Short Term Goal 4 (Week 2): Patient will demonstrate functional problem solving for basic and familiar tasks with Mod A verbal cues.  SLP Short Term Goal 5 (Week 2): Patient will attend/scan to left field of enviornment during functional tasks with Min A verbal cues.   Weekly Progress Updates: Patient has made minimal gains and has met 1 of 5 STGs this reporting period. Currently, patient is consuming Dys. 3 textures with nectar-thick liquids with overall Mod A verbal cues for use of swallowing compensatory strategies and consistent overt s/s of aspiration. Therefore, trials of thin liquids have not been attempted. Patient demonstrates improved speech intelligibility and is overall 100% intelligible with supervision verbal cues at the sentence level. Patient continues to require overall Max A verbal cues for problem solving and selective attention to tasks. Patient and family education ongoing. Patient would benefit from continued skilled SLP intervention to maximize his swallowing and cognitive functioning prior to discharge.      Intensity: Minumum of 1-2 x/day, 30 to 90 minutes Frequency: 3 to 5 out of 7 days Duration/Length of Stay: 21-23 days  Treatment/Interventions: Cognitive remediation/compensation;Environmental controls;Speech/Language facilitation;Therapeutic Activities;Patient/family education;Dysphagia/aspiration precaution training;Cueing hierarchy;Functional tasks;Internal/external aids   Daily Session  Skilled Therapeutic Interventions: Skilled treatment session focused on cognitive goals. SLP facilitated session by providing extra time and overall Mod I for  functional problem solving with a basic money management task. However, patient required overall Min A verbal cues for problem solving during mildly  complex, daily mathematical questions but was Mod I for scanning to left field of environment while reading at the sentence level. Patient left upright in wheelchair with alarm on and visitor present. Continue with current plan of care.      Pain No/Denies Pain   Therapy/Group: Individual Therapy  Gem Conkle 04/14/2018, 12:32 PM

## 2018-04-15 ENCOUNTER — Inpatient Hospital Stay (HOSPITAL_COMMUNITY): Payer: Medicare Other | Admitting: Speech Pathology

## 2018-04-15 ENCOUNTER — Inpatient Hospital Stay (HOSPITAL_COMMUNITY): Payer: Medicare Other

## 2018-04-15 ENCOUNTER — Inpatient Hospital Stay (HOSPITAL_COMMUNITY): Payer: Self-pay

## 2018-04-15 NOTE — Progress Notes (Signed)
Physical Therapy Weekly Progress Note  Patient Details  Name: Jimmy [name] MRN: [id number] Date of Birth: 10-Aug-1928  Beginning of progress report period: April 08, 2018 End of progress report period: April 15, 2018  Today's Date: 04/15/2018 PT Individual Time: 1445-1600 PT Individual Time Calculation (min): 75 min   Patient has met 2 of 3 short term goals.  Pt is progressing with functional mobility and demonstrates improved midline orientation as well as increased L sided strength. Pt continues to be limited by decreased awareness and poor attention impacting all functional mobility.  Patient continues to demonstrate the following deficits muscle weakness, impaired timing and sequencing and decreased coordination, decreased attention and decreased awareness and decreased standing balance, decreased postural control, hemiplegia and decreased balance strategies and therefore will continue to benefit from skilled PT intervention to increase functional independence with mobility.  Patient progressing toward long term goals..  Continue plan of care.  PT Short Term Goals Week 1:  PT Short Term Goal 1 (Week 1): Pt will performed bed<>chair transfers with min assist PT Short Term Goal 2 (Week 1): Pt will performed bed mobility with CGA PT Short Term Goal 3 (Week 1): Pt will ambulate x 50 ft with LRAD and min assist  PT Short Term Goal 3 - Progress (Week 1): Progressing toward goal Week 2:  PT Short Term Goal 1 (Week 2): Pt will perform bed<>chair transfers with CGA PT Short Term Goal 2 (Week 2): Pt will ambulate x 50 ft with LRAD and min assist PT Short Term Goal 3 (Week 2): Pt will demonstrate midline posture during dynamic sitting and standing activities 75% of the time  Skilled Therapeutic Interventions/Progress Updates:    Pt seated in w/c upon PT arrival, agreeable to therapy tx and denies pain. Pt transported to gym in w/c. Entire session focused on gait training using body weight  supported treadmill training (BWSTT), body weight supported over ground training and 3 musketeers overground gait training. Pt performed sit<>stand with min assist while second helper donned harness system. Pt stepped up onto treadmill with mod assist +2. Pt ambulated forwards with BWSTT 4:23 minutes x247 ft going 0.7 mph with one therapist facilitating L LE movement and second therapist facilitating lateral weightshifting at the hips. Pt performed BWSTT to perform backwards ambulation x121 ft on 0.4 mph with one therapist facilitating L LE movement and second therapist facilitating lateral weightshifting at the hips and verbal cues for sequencing throughout. Pt performed sidestepping while using BWSTT 26f to the right on 0.4 mph and to the left on 0.3 pmh. Pt performed overground training with body weight support system x 200 ft with therapist facilitating L quads/glutes during stance and hip/knee flexion during swing, second therapist steering machine. Pt ambulated x 200 ft 3 musketeers with mod assist +2, verbal cues for sequencing and techniques, increased muscular fatigue noted in L quads and hip flexors compared to previous trials. Pt transported back to room and performed stand pivot to recliner with min assist. Pt left in recliner with needs in reach and chair alarm set.   Therapy Documentation Precautions:  Precautions Precautions: Fall Precaution Comments: left hemiparesis, left inattention Restrictions Weight Bearing Restrictions: No   Therapy/Group: Individual Therapy  ENetta Corrigan PT, DPT 04/15/2018, 7:53 AM

## 2018-04-15 NOTE — Progress Notes (Signed)
Occupational Therapy Session Note  Patient Details  Name: Jimmy [name] MRN: [id number] Date of Birth: 08-18-1928  Today's Date: 04/15/2018 OT Individual Time: 6948-5462 OT Individual Time Calculation (min): 60 min    Short Term Goals: Week 1:  OT Short Term Goal 1 (Week 1): Pt will complete toilet transfer with min A. OT Short Term Goal 2 (Week 1): Pt will weight shift to his L side to cleanse self on toilet with CGA. OT Short Term Goal 3 (Week 1): Pt will bathe with min A. OT Short Term Goal 4 (Week 1): Pt will don shirt with min A. OT Short Term Goal 5 (Week 1): Pt will be able to use LUE as a stabilizing A with donning LB clothing.  Skilled Therapeutic Interventions/Progress Updates:    OT intervention with focus on BADL retraining, functional transfers, sitting balance, standing balance, attention to L, attention to task, and safety awareness to increase independence with BADLs.  Pt engaged in bathing at shower level and dressing with sit<>stand from w/c at sink.  Pt continues to require verbal cues for sitting balance when engaged in bathing tasks.  Pt with significant lean but able to self correct with verbal cues.  Pt initiating use of LUE during bathing/dressing tasks but requires assistance to complete tasks.  Pt requires mod verbal cues throughout session to attend to task as he becomes easily distracted. Pt's wife present to observe during session. Pt transferred to recliner and remained in recliner with belt alarm activated and all needs within reach. Wife present.   Therapy Documentation Precautions:  Precautions Precautions: Fall Precaution Comments: left hemiparesis, left inattention Restrictions Weight Bearing Restrictions: No Pain:  Pt denies pain ADL: ADL Eating: Minimal assistance Grooming: Setup, Supervision/safety Where Assessed-Grooming: Sitting at sink Upper Body Bathing: Minimal assistance, Maximal cueing Where Assessed-Upper Body Bathing: Shower Lower  Body Bathing: Minimal assistance, Maximal cueing Where Assessed-Lower Body Bathing: Shower Upper Body Dressing: Minimal assistance Where Assessed-Upper Body Dressing: Sitting at sink Lower Body Dressing: moderate assistance Where Assessed-Lower Body Dressing: Sitting at sink, Standing at sink [employer]: Minimal assistance, Maximal cueing Social research officer, government Method: Stand pivot [employer]: Grab bars, Transfer tub bench   Therapy/Group: Individual Therapy  [name] 04/15/2018, 11:09 AM

## 2018-04-15 NOTE — Patient Care Conference (Signed)
Inpatient RehabilitationTeam Conference and Plan of Care Update Date: 04/15/2018   Time: 11:20 AM    Patient Name: Jimmy [name]      Medical Record Number: [id number]  Date of Birth: [birth year] Sex: Male         Room/Bed: 4W18C/4W18C-01 Payor Info: Payor: MEDICARE / Plan: MEDICARE PART A AND B / Product Type: *No Product type* /    Admitting Diagnosis: r bg hemorrhage  Admit Date/Time:  04/07/2018  4:24 PM Admission Comments: No comment available   Primary Diagnosis:  <principal problem not specified> Principal Problem: <principal problem not specified>  Patient Active Problem List   Diagnosis Date Noted  . Labile blood pressure   . Hypoalbuminemia due to protein-calorie malnutrition ([place])   . Hypertension with goal blood pressure less than 120/80   . CKD (chronic kidney disease), stage III ([place])   . Dysphagia, post-stroke   . Dysphagia 04/07/2018  . Family history of stroke 04/07/2018  . IVH (intraventricular hemorrhage) ([place]) 04/07/2018  . ICH (intracerebral hemorrhage) ([place]) 04/04/2018  . MCI (mild cognitive impairment) with memory loss 06/17/2017  . Dizziness 06/17/2017  . Diarrhea 10/18/2016  . TIA (transient ischemic attack) 04/08/2016  . Thrombocytopenia ([place]) 04/08/2016  . Leg weakness 11/11/2014  . Advanced care planning/counseling discussion 09/16/2014  . Nocturia 09/16/2014  . Mass of submandibular region   . History of CVA (cerebrovascular accident)   . Chest pressure 11/19/2013  . S/P CABG (coronary artery bypass graft) 11/19/2013  . Chronic kidney disease, stage 3 ([place])   . Aortic stenosis   . Medicare annual wellness visit, subsequent 08/27/2012  . Vitamin D deficiency 08/17/2012  . Dyspnea 06/26/2011  . Erectile dysfunction 09/18/2010  . Carotid artery stenosis 12/01/2009  . UNSPECIFIED SUBJECTIVE VISUAL DISTURBANCE 05/01/2009  . BACK PAIN, LUMBAR 10/24/2008  . Chronic lymphocytic leukemia, Rai stage 0 ([place]) 04/02/2007    Class: Chronic  .  DERMATITIS, SEBORRHEIC NOS 12/16/2006  . GENITAL HERPES 12/04/2006  . Mixed hyperlipidemia 12/04/2006  . Essential hypertension 12/04/2006  . Coronary atherosclerosis 12/04/2006  . GERD 12/04/2006  . Prediabetes 12/04/2006  . RENAL CALCULUS, HX OF 12/04/2006    Expected Discharge Date: Expected Discharge Date: 04/29/18  Team Members Present: Physician leading conference: Dr. Alysia Penna Social Worker Present: Ovidio Kin, LCSW Nurse Present: Leonette Nutting, RN PT Present: Michaelene Song, PT OT Present: Willeen Cass, OT;Roanna Epley, COTA SLP Present: Weston Anna, SLP PPS Coordinator present : Gunnar Fusi     Current Status/Progress Goal Weekly Team Focus  Medical   min mod assist, incont bladder, poor improvement with swallow   maintain med stability, reduce aspiration risk  manage distractibility   Bowel/Bladder   Continenet with episodes of incontinence, LBM 04/13/18  less episodes of incontinence  toileting schedule   Swallow/Nutrition/ Hydration   Dys. 3 textures with nectar-thick liquids, Mod A verbal cues for use of strategies   Supervision  increase use of swallowing strategies, trials of upgraded liquids    ADL's   bathing-mod A; UB dressing-min A; LB dressing-mod A; functional trasnfers-min/mod A; decreased safety awareness; impaired postural control; L inattention and poor attention to task  supervision overall; contact guard for shower transfer  LUE NMR, functional transfers, standing balance, BADL retraining, education, safety awareness,    Mobility   min-mod assist overall, gait w/ rail or RW - short distances, limited in distracting environment and and by L inattention  supervision  all functional mobility, safety awareness, L NMR, L attention   Communication  Supervision-Min A  Mod I  use of speech intelligibility strategies    Safety/Cognition/ Behavioral Observations  Min A  Supervision-MIn A   basic problem solving, recall, attention and  awareness    Pain   no c/o pain, has tylenol prn  pain scale ,2/10  assess & treat as needed   Skin   MASD perineal area, bruising to right AC  no aquired skin break down while on IP rehab  assess q shift      *See Care Plan and progress notes for long and short-term goals.     Barriers to Discharge  Current Status/Progress Possible Resolutions Date Resolved   Physician    Medical stability        see above, toileting program      Nursing                  PT                    OT                  SLP                SW                Discharge Planning/Teaching Needs:  Home with wife who is here daily abnd observes in therapies. Can provide supervision and light min assist due to her own health issues.      Team Discussion:  Goals supervision-CGA Currently min-mod level. Nursing working on timed tolieting due to night time incontinence. Left inattention and very distractible. MD working on BP and adjusting meds, renal function is good. When becomes distracted begins to lean and needs to be cued back to mid-line.  Revisions to Treatment Plan:  DC 1/29    Continued Need for Acute Rehabilitation Level of Care: The patient requires daily medical management by a physician with specialized training in physical medicine and rehabilitation for the following conditions: Daily direction of a multidisciplinary physical rehabilitation program to ensure safe treatment while eliciting the highest outcome that is of practical value to the patient.: Yes Daily medical management of patient stability for increased activity during participation in an intensive rehabilitation regime.: Yes Daily analysis of laboratory values and/or radiology reports with any subsequent need for medication adjustment of medical intervention for : Neurological problems;Other;Blood pressure problems   I attest that I was present, lead the team conference, and concur with the assessment and plan of the  team.   Elease Hashimoto 04/15/2018, 11:51 AM

## 2018-04-15 NOTE — Progress Notes (Signed)
Occupational Therapy Weekly Progress Note  Patient Details  Name: Jimmy [name] MRN: [id number] Date of Birth: [birth year]  Beginning of progress report period: April 08, 2018 End of progress report period: April 15, 2018  Patient has met 5 of 5 short term goals.  Pt is making steady progress with BADLs and functional transfers since admission.  Pt requires max verbal cues for attention to task and attention to his LUE when not engaged functionally. Pt requires max verbal cues for sitting and standing balance when engaged in functional tasks.  Pt exhibits significant L lateral lean when engaged in tasks.  Pt performs functional transfers with min/mod A with max verbal cues for sequencing and safety  Pt requires min A for sit<>stand.  Pt initiates hemi dressing techniques but requires assistance to complete task.  Pt currently completes bathing and UB dressing tasks with min A and LB dressing tasks with mod A.    Patient continues to demonstrate the following deficits: muscle weakness, decreased cardiorespiratoy endurance, decreased coordination and decreased motor planning, decreased attention to left, decreased initiation, decreased attention, decreased awareness, decreased problem solving, decreased safety awareness, decreased memory and delayed processing and decreased sitting balance, decreased standing balance, decreased postural control, hemiplegia and decreased balance strategies and therefore will continue to benefit from skilled OT intervention to enhance overall performance with BADL.  Patient progressing toward long term goals..  Continue plan of care.  OT Short Term Goals Week 1:  OT Short Term Goal 1 (Week 1): Pt will complete toilet transfer with min A. OT Short Term Goal 1 - Progress (Week 1): Met OT Short Term Goal 2 (Week 1): Pt will weight shift to his L side to cleanse self on toilet with CGA. OT Short Term Goal 2 - Progress (Week 1): Met OT Short Term Goal 3 (Week 1): Pt  will bathe with min A. OT Short Term Goal 3 - Progress (Week 1): Met OT Short Term Goal 4 (Week 1): Pt will don shirt with min A. OT Short Term Goal 4 - Progress (Week 1): Met OT Short Term Goal 5 (Week 1): Pt will be able to use LUE as a stabilizing A with donning LB clothing. OT Short Term Goal 5 - Progress (Week 1): Met Week 2:  OT Short Term Goal 1 (Week 2): Pt will perform LB dressing tasks with min A OT Short Term Goal 2 (Week 2): Pt will don pull over shirt with supervision after setup OT Short Term Goal 3 (Week 2): Pt will perform toileting tasks with mod A OT Short Term Goal 4 (Week 2): Pt will maintain standing balance with supervision to pull pants over hips      [name] 04/15/2018, 12:14 PM

## 2018-04-15 NOTE — Progress Notes (Signed)
Speech Language Pathology Daily Session Note  Patient Details  Name: Jimmy [name] MRN: [id number] Date of Birth: 08-02-1928  Today's Date: 04/15/2018 SLP Individual Time: 1255-1355 SLP Individual Time Calculation (min): 60 min  Short Term Goals: Week 2: SLP Short Term Goal 1 (Week 2): Patient will consume current diet with minimal overt s/s of aspiration and Min A verbal cues for use fo swallowing compensatory strategies.  SLP Short Term Goal 2 (Week 2): Patient will consume trials of thin liquids with a head turn to the left with minimal overt s/s of aspiration over 2 sessions prior to initiating the water protocol.  SLP Short Term Goal 3 (Week 2): Patient will demonstrate selective attention to functional tasks in a minimally distracting enviornment for 10 minutes with Min A verbal cues for redirection.  SLP Short Term Goal 4 (Week 2): Patient will demonstrate functional problem solving for basic and familiar tasks with Mod A verbal cues.  SLP Short Term Goal 5 (Week 2): Patient will attend/scan to left field of enviornment during functional tasks with Min A verbal cues.   Skilled Therapeutic Interventions: Skilled treatment session focused on dysphagia goals. SLP facilitated session by providing Min A verbal cues for use of swallowing compensatory strategies with lunch meal of Dys. 3 textures with nectar-thick liquids. Patient did not demonstrate any overt coughing episodes, however, an intermittent wet vocal quality was noted that cleared with verbal cues for use of a throat clear and re-swallow. Patient also demonstrated increased use of swallowing compensatory strategies this session. Recommend patent continue current diet with full supervision.  Patient and wife educated in regards to this clinician's plan of care and goal of repeat MBS next week. Both verbalized understanding and agreement. Patient was ~90% intelligible throughout the session at the sentence level and was overall mod I for  use of speech intelligibility strategies. Patient left upright in recliner with alarm on and wife present. Continue with current plan of care.      Pain No/Denies Pain   Therapy/Group: Individual Therapy  Arley Salamone 04/15/2018, 2:00 PM

## 2018-04-15 NOTE — Progress Notes (Signed)
Social Work [name], male   DOB: 16-Apr-1928, 85 y.o.   MRN: [id number] Met with pt and wife to discuss team conference goals supervision-CGA and target discharge date 1/29. Wife had questions regarding moving furniture and bed height will have therapy team follow up with her. Wife is aware he will need 24 hr care when he first goes home and she is prepared to provide this. Aware she will need to come in and do hands on care prior to discharging home. She states: " He has some work to do before going home."

## 2018-04-15 NOTE — Progress Notes (Addendum)
[facility] PHYSICAL MEDICINE & REHABILITATION PROGRESS NOTE   Subjective/Complaints:  Per RN still incont, but hasn't tried timed toileting  ROS: Denies CP, SOB, N/V/D  Objective:   No results found. Recent Labs    04/13/18 1358  WBC 7.8  HGB 14.5  HCT 43.4  PLT 163   No results for input(s): NA, K, CL, CO2, GLUCOSE, BUN, CREATININE, CALCIUM in the last 72 hours.  Intake/Output Summary (Last 24 hours) at 04/15/2018 0819 Last data filed at 04/14/2018 0828 Gross per 24 hour  Intake 360 ml  Output -  Net 360 ml     Physical Exam: Vital Signs Blood pressure (!) 164/69, pulse 69, temperature 98.6 F (37 C), temperature source Oral, resp. rate 18, height 5' 9.5" (1.765 m), weight 79.8 kg, SpO2 96 %.  Physical Exam Constitutional: No distress . Vital signs reviewed. HENT: Normocephalic.  Atraumatic. Eyes: EOMI. No discharge. Cardiovascular: RRR. No JVD.  + Murmur Respiratory: CTA bilaterally. Normal effort. GI: BS +. Non-distended. Musc: No edema or tenderness in extremities. Neurological:Alert Dysarthria Motor: LUE: 1+/5 proximal to distal  LLE: 2+/5 HF, KE and 3/5 ADF/PF.  RUE/RLE grossly 4+/5 proximal to distal, stable Skin: Skin iswarm. He isnot diaphoretic.  Psychiatric: He has anormal mood and affect. Hisbehavior is normal.   Assessment/Plan: 1. Functional deficits secondary to RIght BG ICH which require 3+ hours per day of interdisciplinary therapy in a comprehensive inpatient rehab setting.  Physiatrist is providing close team supervision and 24 hour management of active medical problems listed below.  Physiatrist and rehab team continue to assess barriers to discharge/monitor patient progress toward functional and medical goals  Care Tool:  Bathing    Body parts bathed by patient: Chest, Left arm, Abdomen, Front perineal area, Right upper leg, Left upper leg, Face, Buttocks   Body parts bathed by helper: Right lower leg, Left lower leg, Right  arm     Bathing assist Assist Level: Minimal Assistance - Patient > 75%     Upper Body Dressing/Undressing Upper body dressing   What is the patient wearing?: Pull over shirt    Upper body assist Assist Level: Moderate Assistance - Patient 50 - 74%    Lower Body Dressing/Undressing Lower body dressing      What is the patient wearing?: Underwear/pull up, Pants     Lower body assist Assist for lower body dressing: Moderate Assistance - Patient 50 - 74%     Toileting Toileting    Toileting assist Assist for toileting: Moderate Assistance - Patient 50 - 74%     Transfers Chair/bed transfer  Transfers assist  Chair/bed transfer activity did not occur: Safety/medical concerns  Chair/bed transfer assist level: Moderate Assistance - Patient 50 - 74%     Locomotion Ambulation   Ambulation assist      Assist level: Moderate Assistance - Patient 50 - 74% Assistive device: Walker-rolling Max distance: 10f   Walk 10 feet activity   Assist     Assist level: Moderate Assistance - Patient - 50 - 74% Assistive device: Walker-rolling   Walk 50 feet activity   Assist Walk 50 feet with 2 turns activity did not occur: Safety/medical concerns         Walk 150 feet activity   Assist Walk 150 feet activity did not occur: Safety/medical concerns         Walk 10 feet on uneven surface  activity   Assist Walk 10 feet on uneven surfaces activity did not occur: Safety/medical concerns  Wheelchair     Assist Will patient use wheelchair at discharge?: No Type of Wheelchair: Manual    Wheelchair assist level: Minimal Assistance - Patient > 75% Max wheelchair distance: 150'    Wheelchair 50 feet with 2 turns activity    Assist        Assist Level: Minimal Assistance - Patient > 75%   Wheelchair 150 feet activity     Assist     Assist Level: Minimal Assistance - Patient > 75%    Medical Problem List and Plan: 1.  Left  side weakness with dysarthria and dysphagia secondary to acute right basal ganglia hemorrhage secondary to hypertensive crisis   Cont CIR PT, OT, SLP, Team conference today please see physician documentation under team conference tab, met with team face-to-face to discuss problems,progress, and goals. Formulized individual treatment plan based on medical history, underlying problem and comorbidities. 2. DVT Prophylaxis/ No Anticoagulation: SCDs 3. Pain Management:  Tylenol as needed 4. Mood:  Provide emotional support 5. Neuropsych: This patient is ?  Fully capable of making decisions on his own behalf. 6. Skin/Wound Care:  Routine skin checks 7. Fluids/Electrolytes/Nutrition:  Routine in and out's , I 459m                                    8. Dysphagia. Dysphagia #3 neck thick liquids. Follow-up speech therapy  Advance diet as tolerated 9. Hypertension. Lisinopril 2.5 mg (5 mg PTA) daily. Monitor with increased mobility Vitals:   04/14/18 1944 04/15/18 0541  BP: 121/75 (!) 164/69  Pulse: 70 69  Resp: 19 18  Temp: 98 F (36.7 C) 98.6 F (37 C)  SpO2: 95% 96%   Labile cont to monitor 10. History of gout. Allopurinol 300 mg daily. 11. History of CAD with CABG. No chest pain or shortness of breath 12. CKD stage III. Baseline creatinine 1.40  Creatinine 1.29 on 1/8  Continue to monitor 13.  Cough- likely multifactorial - no fever , lung exam normal, hx GERD, cont protonix, SLP to work on RMT 14.  Hypoalbuminemia  Supplement initiated on 1/11 15.  Thrombocytopenia-resolved  Is 123 on 1/8  CBC 1/13 plt nl     LOS: 8 days A FACE TO FACE EVALUATION WAS PERFORMED  ACharlett Blake1/15/2020, 8:19 AM

## 2018-04-16 ENCOUNTER — Inpatient Hospital Stay (HOSPITAL_COMMUNITY): Payer: Medicare Other | Admitting: Speech Pathology

## 2018-04-16 ENCOUNTER — Inpatient Hospital Stay (HOSPITAL_COMMUNITY): Payer: Medicare Other | Admitting: Physical Therapy

## 2018-04-16 ENCOUNTER — Inpatient Hospital Stay (HOSPITAL_COMMUNITY): Payer: Medicare Other

## 2018-04-16 NOTE — Progress Notes (Signed)
Physical Therapy Session Note  Patient Details  Name: Jimmy [name] MRN: [id number] Date of Birth: May 22, 1928  Today's Date: 04/16/2018 PT Individual Time: 1300-1415 PT Individual Time Calculation (min): 75 min   Short Term Goals: Week 2:  PT Short Term Goal 1 (Week 2): Pt will perform bed<>chair transfers with CGA PT Short Term Goal 2 (Week 2): Pt will ambulate x 50 ft with LRAD and min assist PT Short Term Goal 3 (Week 2): Pt will demonstrate midline posture during dynamic sitting and standing activities 75% of the time  Skilled Therapeutic Interventions/Progress Updates:   Pt in recliner and agreeable to therapy, denies pain. Stand pivot transfer to w/c, mod assist. Worked on independence w/ w/c propulsion, self-propelled w/c towards therapy gym w/ supervision-min assist via R hemi technique. Max verbal and visual cues for technique. Self-propelled ~100', total assist remainder of way. Ambulated 25' w/ RW, mod assist, to assess for carryover from using litegait yesterday. Pt w/ improved LLE management, however continues to require manual assist for postural control and lateral weight shifting. Worked on postural trunk control while seated at edge of mat. Performed sit-ups from propped surface 4x5 reps, trunk rotations in both directions 2x10, R sit<>sidelying 1x5, and tossing ball back and forth x30 reps. Emphasis on maintaining neutral pelvis w/ maintaining postural control w/ perturbations. Multiple LOBs posteriorly and to L side, needs max assist to correct. Improved postural control w/ wedge under L hip to bring pelvis to neutral. Switched to new w/c w/ hard back and added towel wedge to L hip w/ improved resting posture in w/c. NuStep 7 min @ level 3 w/ LEs only to work on LLE muscle activation and reciprocal movement pattern. Manual assist needed for neutral LLE alignment. Returned to room and ended session in w/c, all needs in reach.   Therapy Documentation Precautions:   Precautions Precautions: Fall Precaution Comments: left hemiparesis, left inattention Restrictions Weight Bearing Restrictions: No  Therapy/Group: Individual Therapy  Kayse Puccini Clent Demark 04/16/2018, 2:19 PM

## 2018-04-16 NOTE — Progress Notes (Signed)
Occupational Therapy Session Note  Patient Details  Name: Jimmy [name] MRN: [id number] Date of Birth: December 08, 1928  Today's Date: 04/16/2018 OT Individual Time: 2707-8675 OT Individual Time Calculation (min): 85 min    Short Term Goals: Week 1:  OT Short Term Goal 1 (Week 1): Pt will complete toilet transfer with min A. OT Short Term Goal 1 - Progress (Week 1): Met OT Short Term Goal 2 (Week 1): Pt will weight shift to his L side to cleanse self on toilet with CGA. OT Short Term Goal 2 - Progress (Week 1): Met OT Short Term Goal 3 (Week 1): Pt will bathe with min A. OT Short Term Goal 3 - Progress (Week 1): Met OT Short Term Goal 4 (Week 1): Pt will don shirt with min A. OT Short Term Goal 4 - Progress (Week 1): Met OT Short Term Goal 5 (Week 1): Pt will be able to use LUE as a stabilizing A with donning LB clothing. OT Short Term Goal 5 - Progress (Week 1): Met  Skilled Therapeutic Interventions/Progress Updates:    1:1. Pain not reported throughout session. Pt reporting need to void bladder. Pt linens already soiled. Pt  Completes transfers with bed rails and grab bars with min A. Pt bathes with supervision for sitting balance and MIN A for washing buttocks/HOH A of LUE to wash RUE. Pt requires VC for L attention to wash L arm. Pt able to don shirt at sink iwht min A for first short sleeve shirt and MOD A for second long sleeve shirt. Pt dons pants/brief with max A. Pt able to don B socks and shoes after shoe button installation and crossing LE into figure 4. Pt requires VC for orientation of socks and demonstration of 1 handed technique. Pt returned to recliner with exit alarm on, call light in reach and all needs met.  Therapy Documentation Precautions:  Precautions Precautions: Fall Precaution Comments: left hemiparesis, left inattention Restrictions Weight Bearing Restrictions: No General:   Vital Signs: Therapy Vitals Temp: 97.9 F (36.6 C) Pulse Rate: 90 Resp: 17 BP:  128/87 Patient Position (if appropriate): Lying Oxygen Therapy O2 Device: Room Air   Therapy/Group: Individual Therapy  Tonny Branch 04/16/2018, 6:58 AM

## 2018-04-16 NOTE — Progress Notes (Signed)
[place] PHYSICAL MEDICINE & REHABILITATION PROGRESS NOTE   Subjective/Complaints:  Left hand pain when stretching fingers, no resting hand splint  ROS: Denies CP, SOB, N/V/D  Objective:   No results found. Recent Labs    04/13/18 1358  WBC 7.8  HGB 14.5  HCT 43.4  PLT 163   No results for input(s): NA, K, CL, CO2, GLUCOSE, BUN, CREATININE, CALCIUM in the last 72 hours.  Intake/Output Summary (Last 24 hours) at 04/16/2018 0743 Last data filed at 04/15/2018 1800 Gross per 24 hour  Intake 480 ml  Output -  Net 480 ml     Physical Exam: Vital Signs Blood pressure 128/87, pulse 90, temperature 97.9 F (36.6 C), resp. rate 17, height 5' 9.5" (1.765 m), weight 79.8 kg, SpO2 96 %.  Physical Exam Constitutional: No distress . Vital signs reviewed. HENT: Normocephalic.  Atraumatic. Eyes: EOMI. No discharge. Cardiovascular: RRR. No JVD.  + Murmur Respiratory: CTA bilaterally. Normal effort. GI: BS +. Non-distended. Musc: No edema or tenderness in extremities. Neurological:Alert Dysarthria Motor: LUE: 1+/5 proximal to distal  LLE: 2+/5 HF, KE and 3/5 ADF/PF.  RUE/RLE grossly 4+/5 proximal to distal, stable Tomne MAS 1 in Left finger flexors Skin: Skin iswarm. He isnot diaphoretic.  Psychiatric: He has anormal mood and affect. Hisbehavior is normal.   Assessment/Plan: 1. Functional deficits secondary to RIght BG ICH which require 3+ hours per day of interdisciplinary therapy in a comprehensive inpatient rehab setting.  Physiatrist is providing close team supervision and 24 hour management of active medical problems listed below.  Physiatrist and rehab team continue to assess barriers to discharge/monitor patient progress toward functional and medical goals  Care Tool:  Bathing    Body parts bathed by patient: Chest, Left arm, Abdomen, Front perineal area, Right upper leg, Left upper leg, Face, Buttocks, Right lower leg, Left lower leg   Body parts bathed  by helper: Right lower leg, Left lower leg, Right arm     Bathing assist Assist Level: Minimal Assistance - Patient > 75%     Upper Body Dressing/Undressing Upper body dressing   What is the patient wearing?: Pull over shirt    Upper body assist Assist Level: Minimal Assistance - Patient > 75%    Lower Body Dressing/Undressing Lower body dressing      What is the patient wearing?: Underwear/pull up, Pants     Lower body assist Assist for lower body dressing: Minimal Assistance - Patient > 75%     Toileting Toileting    Toileting assist Assist for toileting: Moderate Assistance - Patient 50 - 74%     Transfers Chair/bed transfer  Transfers assist  Chair/bed transfer activity did not occur: Safety/medical concerns  Chair/bed transfer assist level: Minimal Assistance - Patient > 75%     Locomotion Ambulation   Ambulation assist      Assist level: 2 helpers Assistive device: Lite Gait Max distance: 247 ft   Walk 10 feet activity   Assist     Assist level: 2 helpers Assistive device: Lite Gait   Walk 50 feet activity   Assist Walk 50 feet with 2 turns activity did not occur: Safety/medical concerns  Assist level: 2 helpers Assistive device: Lite Gait    Walk 150 feet activity   Assist Walk 150 feet activity did not occur: Safety/medical concerns  Assist level: 2 helpers Assistive device: Lite Gait    Walk 10 feet on uneven surface  activity   Assist Walk 10 feet on uneven surfaces  activity did not occur: Safety/medical concerns         Wheelchair     Assist Will patient use wheelchair at discharge?: No Type of Wheelchair: Manual    Wheelchair assist level: Minimal Assistance - Patient > 75% Max wheelchair distance: 150'    Wheelchair 50 feet with 2 turns activity    Assist        Assist Level: Minimal Assistance - Patient > 75%   Wheelchair 150 feet activity     Assist     Assist Level: Minimal  Assistance - Patient > 75%    Medical Problem List and Plan: 1.  Left side weakness with dysarthria and dysphagia secondary to acute right basal ganglia hemorrhage secondary to hypertensive crisis   Cont CIR PT, OT, SLP ,2. DVT Prophylaxis/ No Anticoagulation: SCDs 3. Pain Management:  Tylenol as needed, order resting hand splint 4. Mood:  Provide emotional support 5. Neuropsych: This patient is ?  Fully capable of making decisions on his own behalf. 6. Skin/Wound Care:  Routine skin checks 7. Fluids/Electrolytes/Nutrition:  Routine in and out's , I 42ml                                    8. Dysphagia. Dysphagia #3 neck thick liquids. Follow-up speech therapy  Advance diet as tolerated 9. Hypertension. Lisinopril 2.5 mg (5 mg PTA) daily. Monitor with increased mobility Vitals:   04/15/18 2000 04/16/18 0551  BP: (!) 126/59 128/87  Pulse:  90  Resp:  17  Temp:  97.9 F (36.6 C)  SpO2:     Controlled 1/16 10. History of gout. Allopurinol 300 mg daily. 11. History of CAD with CABG. No chest pain or shortness of breath 12. CKD stage III. Baseline creatinine 1.40  Creatinine 1.29 on 1/8  Continue to monitor 13.  Cough- likely multifactorial - no fever , lung exam normal, hx GERD, cont protonix, SLP to work on RMT 14.  Hypoalbuminemia  Supplement initiated on 1/11 15.  Thrombocytopenia-resolved  Is 123 on 1/8  CBC 1/13 plt nl     LOS: 9 days A FACE TO FACE EVALUATION WAS PERFORMED  Charlett Blake 04/16/2018, 7:43 AM

## 2018-04-16 NOTE — Progress Notes (Signed)
Speech Language Pathology Daily Session Note  Patient Details  Name: Jimmy [name] MRN: [id number] Date of Birth: 1928-07-08  Today's Date: 04/16/2018 SLP Individual Time: 1030-1130 SLP Individual Time Calculation (min): 60 min  Short Term Goals: Week 2: SLP Short Term Goal 1 (Week 2): Patient will consume current diet with minimal overt s/s of aspiration and Min A verbal cues for use fo swallowing compensatory strategies.  SLP Short Term Goal 2 (Week 2): Patient will consume trials of thin liquids with a head turn to the left with minimal overt s/s of aspiration over 2 sessions prior to initiating the water protocol.  SLP Short Term Goal 3 (Week 2): Patient will demonstrate selective attention to functional tasks in a minimally distracting enviornment for 10 minutes with Min A verbal cues for redirection.  SLP Short Term Goal 4 (Week 2): Patient will demonstrate functional problem solving for basic and familiar tasks with Mod A verbal cues.  SLP Short Term Goal 5 (Week 2): Patient will attend/scan to left field of enviornment during functional tasks with Min A verbal cues.   Skilled Therapeutic Interventions: Skilled treatment session focused on speech and swallowing goals. SLP facilitated session by providing supervision verbal cues for performing RMST exercises accurately. Patient performed exercises with a self-perceived effort level with a 7/10. SLP also facilitated session by providing trials of ice chips with minimal overt s/s of aspiration and Min A verbal cues needed to "swallow fast." Recommend continued trials with SLP only. Patient left upright in recliner with all needs within reach. Continue with current plan of care.      Pain No/Denies Pain   Therapy/Group: Individual Therapy  Pratik Dalziel 04/16/2018, 12:13 PM

## 2018-04-16 NOTE — Progress Notes (Signed)
Orthopedic Tech Progress Note Patient Details:  Jimmy [name] Jul 16, 1928 [id number] Called in order [name], male   DOB: May 07, 1928, 85 y.o.   MRN: [id number]   Janit Pagan 04/16/2018, 9:14 AM

## 2018-04-17 ENCOUNTER — Inpatient Hospital Stay (HOSPITAL_COMMUNITY): Payer: Medicare Other | Admitting: Speech Pathology

## 2018-04-17 ENCOUNTER — Inpatient Hospital Stay (HOSPITAL_COMMUNITY): Payer: Medicare Other

## 2018-04-17 ENCOUNTER — Inpatient Hospital Stay (HOSPITAL_COMMUNITY): Payer: Medicare Other | Admitting: Physical Therapy

## 2018-04-17 NOTE — Progress Notes (Signed)
Physical Therapy Session Note  Patient Details  Name: Jimmy [name] MRN: [id number] Date of Birth: 12-Oct-1928  Today's Date: 04/17/2018 PT Individual Time: 0900-1010 PT Individual Time Calculation (min): 70 min   Short Term Goals: Week 2:  PT Short Term Goal 1 (Week 2): Pt will perform bed<>chair transfers with CGA PT Short Term Goal 2 (Week 2): Pt will ambulate x 50 ft with LRAD and min assist PT Short Term Goal 3 (Week 2): Pt will demonstrate midline posture during dynamic sitting and standing activities 75% of the time  Skilled Therapeutic Interventions/Progress Updates:   Pt in w/c and agreeable to therapy, denies pain. Total assist w/c transport to/from therapy gym for time management. Performed clothespin reaching task, reaching to various directions in seated w/ emphasis on facilitating increased R hip WB, L trunk flexion, and anterior pelvic tilt in addition to maintaining postural control w/ trunk rotations and anterior and posterior trunk lean. Tactile cues for L trunk activation throughout. Max assist to correct 1 LOB posteriorly. Standing clothespin task emphasized maintaining L knee extension in prolonged standing, w/ lateral weight shifting, and while performing dual task. Min assist for standing balance w/o UE support and frequent verbal and tactile cues for L quad activation. Also worked on this while performing card matching task to increase dual task cognitive load. Min-mod assist needed in stance w/o UE support when performing task w/ increased cognitive demand. Sit<>stands from mat w/ R hand-over-L hand to facilitate increased LLE WB during transition, min assist to boost. Ambulated 75' w/ LUE over therapists shoulder to assess for carryover of lateral weight shifting and attention to LLE, continued to need mod assist. Pt self-propelled w/c to day room w/ supervision via R hemi technique, ~100'. NuStep 8 min @ level 4, LEs only, LE strengthening and reciprocal movement pattern,  manual assist needed to maintain neutral LLE alignment. Returned to room and ended session in w/c, all needs in reach.   Therapy Documentation Precautions:  Precautions Precautions: Fall Precaution Comments: left hemiparesis, left inattention Restrictions Weight Bearing Restrictions: No  Therapy/Group: Individual Therapy  Holly Pring K Lydiann Bonifas 04/17/2018, 10:14 AM

## 2018-04-17 NOTE — Progress Notes (Signed)
Speech Language Pathology Daily Session Note  Patient Details  Name: Jimmy [name] MRN: [id number] Date of Birth: 1928-10-21  Today's Date: 04/17/2018 SLP Individual Time: 8938-1017 SLP Individual Time Calculation (min): 41 min  Short Term Goals: Week 2: SLP Short Term Goal 1 (Week 2): Patient will consume current diet with minimal overt s/s of aspiration and Min A verbal cues for use fo swallowing compensatory strategies.  SLP Short Term Goal 2 (Week 2): Patient will consume trials of thin liquids with a head turn to the left with minimal overt s/s of aspiration over 2 sessions prior to initiating the water protocol.  SLP Short Term Goal 3 (Week 2): Patient will demonstrate selective attention to functional tasks in a minimally distracting enviornment for 10 minutes with Min A verbal cues for redirection.  SLP Short Term Goal 4 (Week 2): Patient will demonstrate functional problem solving for basic and familiar tasks with Mod A verbal cues.  SLP Short Term Goal 5 (Week 2): Patient will attend/scan to left field of enviornment during functional tasks with Min A verbal cues.   Skilled Therapeutic Interventions:  Pt was seen for skilled ST targeting dysphagia goals.  SLP re-administered Respiratory Muscle Strength Training (RMST) assessment to measure progress from initial evaluation.  Pt demonstrated an increase in peak Maximum Inspiratory Pressure (MIP) from 25 cm H2O to 34.  Peak Maximum Expiratory Pressure (MEP) remained the same at 39 cm H2O.  Inspiratory Muscle Strength Training (IMST) device was adjusted to 24 cm H2O which is ~70% of pt's MIP and Expiratory Muscle Strength Training (EMST) device was adjusted a quarter turn which is roughly equivalent to ~40 cm H2O.  Pt could complete 25 repetitions of both IMST and EMST with mod I and a self reported effort level of 5-6 out of 10.  Pt was returned to bed and left with call bell within reach, bed alarm set, and wife at bedside.  Continue per  current plan of care.    Pain Pain Assessment Pain Scale: 0-10 Pain Score: 0-No pain  Therapy/Group: Individual Therapy  Pamelyn Bancroft, Selinda Orion 04/17/2018, 3:35 PM

## 2018-04-17 NOTE — Progress Notes (Signed)
Occupational Therapy Session Note  Patient Details  Name: Jimmy [name] MRN: [id number] Date of Birth: 1928-04-10  Today's Date: 04/17/2018 OT Individual Time: 8453-6468 OT Individual Time Calculation (min): 75 min    Short Term Goals: Week 2:  OT Short Term Goal 1 (Week 2): Pt will perform LB dressing tasks with min A OT Short Term Goal 2 (Week 2): Pt will don pull over shirt with supervision after setup OT Short Term Goal 3 (Week 2): Pt will perform toileting tasks with mod A OT Short Term Goal 4 (Week 2): Pt will maintain standing balance with supervision to pull pants over hips  Skilled Therapeutic Interventions/Progress Updates:    OT intervention with focus on bed mobility, sitting balance, sit<>stand, standing balance, functional transfers, BADL retraining, attention to L, LUE forced use, safety awareness, and activity tolerance to increase independene with BADL.  Pt resting in bed upon arrival and sat EOB with min A and max verbal cues for sequencing and technique.  Pt maintained sitting balance EOB win preparation for squat pivot transfer to w/c.  Pt engaged in bathing with sit<>stand at shower level.  See below for assist level for bathing/dressing activities.  Pt continues to exhibit L lean in sitting when not actively engaged in functional task.  Pt can self correct when cued.  Pt requires min verbal cues for sitting and standing balance.  Pt requires min A for sit<>stand and standing balance for LB bathing/dressing tasks.  Pt initiates use of LUE in functional tasks but requires assistance to complete task. Pt requires max verbal cues for sequencing sit<>stand. Pt remained in w/c with belt alarm activated, half lap tray in place, and all needs within reach.   Therapy Documentation Precautions:  Precautions Precautions: Fall Precaution Comments: left hemiparesis, left inattention Restrictions Weight Bearing Restrictions: No Pain:  Pt denies pain this  morning ADL: ADL Eating: Minimal assistance Grooming: Setup, Supervision/safety Where Assessed-Grooming: Sitting at sink Upper Body Bathing: Minimal assistance, Minimal cueing Where Assessed-Upper Body Bathing: Shower Lower Body Bathing: Minimal assistance, Minimal cueing Where Assessed-Lower Body Bathing: Shower Upper Body Dressing: Supervision/safety, Moderate cueing Where Assessed-Upper Body Dressing: Sitting at sink Lower Body Dressing: Moderate assistance, Moderate cueing Where Assessed-Lower Body Dressing: Sitting at sink, Standing at sink Toileting: Moderate assistance Where Assessed-Toileting: Glass blower/designer: Moderate assistance Toilet Transfer Method: Stand pivot Toilet Transfer Equipment: Energy manager: Minimal assistance, Minimal cueing Social research officer, government Method: Radiographer, therapeutic: Grab bars, Transfer tub bench   Therapy/Group: Individual Therapy  [name] 04/17/2018, 8:17 AM

## 2018-04-17 NOTE — Progress Notes (Signed)
[place] PHYSICAL MEDICINE & REHABILITATION PROGRESS NOTE   Subjective/Complaints:  No issues overnite  ROS: Denies CP, SOB, N/V/D  Objective:   No results found. No results for input(s): WBC, HGB, HCT, PLT in the last 72 hours. No results for input(s): NA, K, CL, CO2, GLUCOSE, BUN, CREATININE, CALCIUM in the last 72 hours.  Intake/Output Summary (Last 24 hours) at 04/17/2018 0823 Last data filed at 04/16/2018 2230 Gross per 24 hour  Intake 840 ml  Output 475 ml  Net 365 ml     Physical Exam: Vital Signs Blood pressure 123/62, pulse 66, temperature (!) 97.5 F (36.4 C), temperature source Oral, resp. rate 16, height 5' 9.5" (1.765 m), weight 79.8 kg, SpO2 96 %.  Physical Exam Constitutional: No distress . Vital signs reviewed. HENT: Normocephalic.  Atraumatic. Eyes: EOMI. No discharge. Cardiovascular: RRR. No JVD.  + Murmur Respiratory: CTA bilaterally. Normal effort. GI: BS +. Non-distended. Musc: No edema or tenderness in extremities. Neurological:Alert Dysarthria Motor: LUE: 1+/5 proximal to distal  LLE: 2+/5 HF, KE and 3/5 ADF/PF.  RUE/RLE grossly 4+/5 proximal to distal, stable Tomne MAS 1 in Left finger flexors Skin: Skin iswarm. He isnot diaphoretic.  Psychiatric: He has anormal mood and affect. Hisbehavior is normal.   Assessment/Plan: 1. Functional deficits secondary to RIght BG ICH which require 3+ hours per day of interdisciplinary therapy in a comprehensive inpatient rehab setting.  Physiatrist is providing close team supervision and 24 hour management of active medical problems listed below.  Physiatrist and rehab team continue to assess barriers to discharge/monitor patient progress toward functional and medical goals  Care Tool:  Bathing    Body parts bathed by patient: Chest, Left arm, Abdomen, Front perineal area, Right upper leg, Left upper leg, Face, Buttocks, Right lower leg, Left lower leg   Body parts bathed by helper: Right  lower leg, Left lower leg, Right arm     Bathing assist Assist Level: Minimal Assistance - Patient > 75%     Upper Body Dressing/Undressing Upper body dressing   What is the patient wearing?: Pull over shirt    Upper body assist Assist Level: Minimal Assistance - Patient > 75%    Lower Body Dressing/Undressing Lower body dressing      What is the patient wearing?: Underwear/pull up, Pants     Lower body assist Assist for lower body dressing: Minimal Assistance - Patient > 75%     Toileting Toileting    Toileting assist Assist for toileting: Moderate Assistance - Patient 50 - 74%     Transfers Chair/bed transfer  Transfers assist  Chair/bed transfer activity did not occur: Safety/medical concerns  Chair/bed transfer assist level: Moderate Assistance - Patient 50 - 74%     Locomotion Ambulation   Ambulation assist      Assist level: Moderate Assistance - Patient 50 - 74% Assistive device: Walker-rolling Max distance: 25'   Walk 10 feet activity   Assist     Assist level: Moderate Assistance - Patient - 50 - 74% Assistive device: Walker-rolling   Walk 50 feet activity   Assist Walk 50 feet with 2 turns activity did not occur: Safety/medical concerns  Assist level: 2 helpers Assistive device: Lite Gait    Walk 150 feet activity   Assist Walk 150 feet activity did not occur: Safety/medical concerns  Assist level: 2 helpers Assistive device: Lite Gait    Walk 10 feet on uneven surface  activity   Assist Walk 10 feet on uneven surfaces activity  did not occur: Safety/medical concerns         Wheelchair     Assist Will patient use wheelchair at discharge?: No Type of Wheelchair: Manual    Wheelchair assist level: Minimal Assistance - Patient > 75% Max wheelchair distance: 100'    Wheelchair 50 feet with 2 turns activity    Assist        Assist Level: Minimal Assistance - Patient > 75%   Wheelchair 150 feet activity      Assist     Assist Level: Minimal Assistance - Patient > 75%    Medical Problem List and Plan: 1.  Left side weakness with dysarthria and dysphagia secondary to acute right basal ganglia hemorrhage secondary to hypertensive crisis   Cont CIR PT, OT, SLP ,2. DVT Prophylaxis/ No Anticoagulation: SCDs 3. Pain Management:  Tylenol as needed, order resting hand splint 4. Mood:  Provide emotional support 5. Neuropsych: This patient is ?  Fully capable of making decisions on his own behalf. 6. Skin/Wound Care:  Routine skin checks 7. Fluids/Electrolytes/Nutrition:  Routine in and out's , I 452ml                                    8. Dysphagia. Dysphagia #3 neck thick liquids. Follow-up speech therapy  Advance diet as tolerated 9. Hypertension. Lisinopril 2.5 mg (5 mg PTA) daily. Monitor with increased mobility Vitals:   04/17/18 0441 04/17/18 0512  BP: 132/60 123/62  Pulse: (!) 54 66  Resp: 16   Temp: 97.6 F (36.4 C) (!) 97.5 F (36.4 C)  SpO2: 93% 96%   Controlled 1/17 10. History of gout. Allopurinol 300 mg daily. 11. History of CAD with CABG. No chest pain or shortness of breath 12. CKD stage III. Baseline creatinine 1.40  Creatinine 1.29 on 1/8  Continue to monitor 13.  Cough- likely multifactorial - no fever , lung exam normal, hx GERD, cont protonix, SLP to work on RMT 14.  Hypoalbuminemia  Supplement initiated on 1/11 15.  Thrombocytopenia-resolved  Is 123 on 1/8  CBC 1/13 plt nl 16.  Urinary incont- offer urinal or toilet every 3hr    LOS: 10 days A FACE TO FACE EVALUATION WAS PERFORMED  Charlett Blake 04/17/2018, 8:23 AM

## 2018-04-18 ENCOUNTER — Inpatient Hospital Stay (HOSPITAL_COMMUNITY): Payer: Medicare Other | Admitting: Physical Therapy

## 2018-04-18 NOTE — Progress Notes (Signed)
Physical Therapy Session Note  Patient Details  Name: Jimmy [name] MRN: [id number] Date of Birth: [birth year]  Today's Date: 04/18/2018 PT Individual Time: 1019-1058 PT Individual Time Calculation (min): 39 min   Short Term Goals: Week 2:  PT Short Term Goal 1 (Week 2): Pt will perform bed<>chair transfers with CGA PT Short Term Goal 2 (Week 2): Pt will ambulate x 50 ft with LRAD and min assist PT Short Term Goal 3 (Week 2): Pt will demonstrate midline posture during dynamic sitting and standing activities 75% of the time  Skilled Therapeutic Interventions/Progress Updates:  Pt received in recliner reporting his "backside" is sore from prolonged sitting & therapist assisted him to standing for pain relief & to allow wife to measure pants. During static standing pt requires mod assist 2/2 L lateral lean 2/2 decreased L knee & hip extension & impaired balance. Pt transfers to w/c on L with mod assist and therapist provides total assist for donning ted hose & shoes. Transported pt to gym via w/c dependent assist for time management. Pt transfers sit<>stand with min<>mod assist and cuing for hand placement. Pt ambulates 15 ft with LUE supported on therapist's shoulder & max assist with L lateran lean, very little weight shifting R, LLE buckling and impaired balance. Provided pt with RW & L hand orthosis & pt ambulates 15 ft + 67 ft with mod assist with max cuing to sequence turning, pt with minimal R weight shifting during gait, and impaired balance with therapist providing manual facilitation for weight shifting R with pt demonstrating improved LLE foot clearance with this. Educated pt on need for midline reorientation 2/2 L lateral lean; pt performed standing task requiring him to reach overhead to R with LUE support from RW & min assist for balance with task focusing on weight shifting R and midline orientation. Back in room pt assisted to the toilet with min/mod assist via stand pivot and therapist  performing clothing management. Pt left sitting on toilet in care of RN.  Therapy Documentation Precautions:  Precautions Precautions: Fall Precaution Comments: left hemiparesis, left inattention Restrictions Weight Bearing Restrictions: No    Therapy/Group: Individual Therapy  Waunita Schooner 04/18/2018, 11:04 AM

## 2018-04-18 NOTE — Plan of Care (Signed)
  Problem: Consults Goal: RH STROKE PATIENT EDUCATION Description See Patient Education module for education specifics, pt / fam verb understanding with min A  Outcome: Progressing Goal: Nutrition Consult-if indicated Outcome: Progressing   Problem: RH BOWEL ELIMINATION Goal: RH STG MANAGE BOWEL WITH ASSISTANCE Description STG Manage Bowel with min  Assistance.  Outcome: Progressing Goal: RH STG MANAGE BOWEL W/MEDICATION W/ASSISTANCE Description STG Manage Bowel with Medication with min Assistance.  Outcome: Progressing   Problem: RH BLADDER ELIMINATION Goal: RH STG MANAGE BLADDER WITH ASSISTANCE Description STG Manage Bladder With min Assistance  Outcome: Progressing   Problem: RH SKIN INTEGRITY Goal: RH STG SKIN FREE OF INFECTION/BREAKDOWN Description Pt will have no infection or breakdown on discharge with min A  Outcome: Progressing   Problem: RH SAFETY Goal: RH STG ADHERE TO SAFETY PRECAUTIONS W/ASSISTANCE/DEVICE Description STG Adhere to Safety Precautions With min Assistance/Device.  Outcome: Progressing

## 2018-04-18 NOTE — Progress Notes (Signed)
[place] PHYSICAL MEDICINE & REHABILITATION PROGRESS NOTE   Subjective/Complaints:  Patient up in his chair.  Complains of some buttock and low back discomfort.  Otherwise states that he slept fairly well  ROS: Patient denies fever, rash, sore throat, blurred vision, nausea, vomiting, diarrhea, cough, shortness of breath or chest pain, joint or back pain, headache, or mood change.    Objective:   No results found. No results for input(s): WBC, HGB, HCT, PLT in the last 72 hours. No results for input(s): NA, K, CL, CO2, GLUCOSE, BUN, CREATININE, CALCIUM in the last 72 hours.  Intake/Output Summary (Last 24 hours) at 04/18/2018 0942 Last data filed at 04/18/2018 0900 Gross per 24 hour  Intake 716 ml  Output 625 ml  Net 91 ml     Physical Exam: Vital Signs Blood pressure (!) 130/59, pulse (!) 55, temperature 97.9 F (36.6 C), temperature source Oral, resp. rate 15, height 5' 9.5" (1.765 m), weight 79.8 kg, SpO2 93 %.  Physical Exam Constitutional: No distress . Vital signs reviewed. HEENT: EOMI, oral membranes moist Neck: supple Cardiovascular: RRR without murmur. No JVD    Respiratory: CTA Bilaterally without wheezes or rales. Normal effort    GI: BS +, non-tender, non-distended  Musc: No edema or tenderness in extremities. Neurological:Alert Dysarthria Motor: LUE: 1+/5 proximal to distal--- unchanged LLE: 2+/5 HF, KE and 3/5 ADF/PF.  RUE/RLE grossly 4+/5 proximal to distal, stable  MAS 1 in Left finger flexors--using wrist hand orthosis Skin: Skin iswarm. He isnot diaphoretic.  Psychiatric: He has anormal mood and affect. Hisbehavior is normal.   Assessment/Plan: 1. Functional deficits secondary to RIght BG ICH which require 3+ hours per day of interdisciplinary therapy in a comprehensive inpatient rehab setting.  Physiatrist is providing close team supervision and 24 hour management of active medical problems listed below.  Physiatrist and rehab team  continue to assess barriers to discharge/monitor patient progress toward functional and medical goals  Care Tool:  Bathing    Body parts bathed by patient: Chest, Left arm, Abdomen, Front perineal area, Right upper leg, Left upper leg, Face, Buttocks, Right lower leg, Left lower leg   Body parts bathed by helper: Right lower leg, Left lower leg, Right arm     Bathing assist Assist Level: Minimal Assistance - Patient > 75%     Upper Body Dressing/Undressing Upper body dressing   What is the patient wearing?: Pull over shirt    Upper body assist Assist Level: Minimal Assistance - Patient > 75%    Lower Body Dressing/Undressing Lower body dressing      What is the patient wearing?: Underwear/pull up, Pants     Lower body assist Assist for lower body dressing: Minimal Assistance - Patient > 75%     Toileting Toileting    Toileting assist Assist for toileting: Moderate Assistance - Patient 50 - 74%     Transfers Chair/bed transfer  Transfers assist  Chair/bed transfer activity did not occur: Safety/medical concerns  Chair/bed transfer assist level: Minimal Assistance - Patient > 75%     Locomotion Ambulation   Ambulation assist      Assist level: Moderate Assistance - Patient 50 - 74% Assistive device: Other (comment)(UE over therapist's shoulder) Max distance: 3'   Walk 10 feet activity   Assist     Assist level: Moderate Assistance - Patient - 50 - 74% Assistive device: Walker-rolling   Walk 50 feet activity   Assist Walk 50 feet with 2 turns activity did not occur:  Safety/medical concerns  Assist level: Moderate Assistance - Patient - 50 - 74% Assistive device: Lite Gait    Walk 150 feet activity   Assist Walk 150 feet activity did not occur: Safety/medical concerns  Assist level: 2 helpers Assistive device: Lite Gait    Walk 10 feet on uneven surface  activity   Assist Walk 10 feet on uneven surfaces activity did not occur:  Safety/medical concerns         Wheelchair     Assist Will patient use wheelchair at discharge?: No Type of Wheelchair: Manual    Wheelchair assist level: Supervision/Verbal cueing Max wheelchair distance: 100'    Wheelchair 50 feet with 2 turns activity    Assist        Assist Level: Supervision/Verbal cueing   Wheelchair 150 feet activity     Assist     Assist Level: Minimal Assistance - Patient > 75%    Medical Problem List and Plan: 1.  Left side weakness with dysarthria and dysphagia secondary to acute right basal ganglia hemorrhage secondary to hypertensive crisis   Cont CIR PT, OT, SLP  2. DVT Prophylaxis/ No Anticoagulation: SCDs 3. Pain Management:  Tylenol as needed, order resting hand splint 4. Mood:  Provide emotional support 5. Neuropsych: This patient is ?  Fully capable of making decisions on his own behalf. 6. Skin/Wound Care:  Routine skin checks  -Weight shifting, appropriate nutrition 7. Fluids/Electrolytes/Nutrition:  Routine in and out's , I 469ml                                     8. Dysphagia. Dysphagia #3 neck thick liquids. Follow-up speech therapy  Advance diet as tolerated 9. Hypertension. Lisinopril 2.5 mg (5 mg PTA) daily. Monitor with increased mobility Vitals:   04/17/18 2014 04/18/18 0442  BP: (!) 155/55 (!) 130/59  Pulse: (!) 55 (!) 55  Resp: 15 15  Temp: 98.6 F (37 C) 97.9 F (36.6 C)  SpO2: 95% 93%   Controlled 1/18 10. History of gout. Allopurinol 300 mg daily. 11. History of CAD with CABG. No chest pain or shortness of breath 12. CKD stage III. Baseline creatinine 1.40  Creatinine 1.29 on 1/8  Continue to monitor 13.  Cough- likely multifactorial - no fever , lung exam normal, hx GERD, cont protonix, SLP to work on RMT 14.  Hypoalbuminemia  Supplement initiated on 1/11 15.  Thrombocytopenia-resolved  Is 123 on 1/8  CBC 1/13 plt nl 16.  Urinary incont- offer urinal or toilet every 3hr    LOS: 11  days A FACE TO [place] 04/18/2018, 9:42 AM

## 2018-04-19 ENCOUNTER — Inpatient Hospital Stay (HOSPITAL_COMMUNITY): Payer: Medicare Other

## 2018-04-19 NOTE — Plan of Care (Signed)
  Problem: Consults Goal: RH STROKE PATIENT EDUCATION Description See Patient Education module for education specifics, pt / fam verb understanding with min A  Outcome: Progressing Goal: Nutrition Consult-if indicated Outcome: Progressing   Problem: RH BOWEL ELIMINATION Goal: RH STG MANAGE BOWEL WITH ASSISTANCE Description STG Manage Bowel with min  Assistance.  Outcome: Progressing Goal: RH STG MANAGE BOWEL W/MEDICATION W/ASSISTANCE Description STG Manage Bowel with Medication with min Assistance.  Outcome: Progressing   Problem: RH BLADDER ELIMINATION Goal: RH STG MANAGE BLADDER WITH ASSISTANCE Description STG Manage Bladder With min Assistance  Outcome: Progressing   Problem: RH SKIN INTEGRITY Goal: RH STG SKIN FREE OF INFECTION/BREAKDOWN Description Pt will have no infection or breakdown on discharge with min A  Outcome: Progressing   Problem: RH SAFETY Goal: RH STG ADHERE TO SAFETY PRECAUTIONS W/ASSISTANCE/DEVICE Description STG Adhere to Safety Precautions With min Assistance/Device.  Outcome: Progressing

## 2018-04-19 NOTE — Progress Notes (Signed)
Physical Therapy Session Note  Patient Details  Name: Jimmy [name] MRN: [id number] Date of Birth: [birth year]  Today's Date: 04/19/2018 PT Individual Time: 6384-6659 PT Individual Time Calculation (min): 60 min   Short Term Goals: Week 2:  PT Short Term Goal 1 (Week 2): Pt will perform bed<>chair transfers with CGA PT Short Term Goal 2 (Week 2): Pt will ambulate x 50 ft with LRAD and min assist PT Short Term Goal 3 (Week 2): Pt will demonstrate midline posture during dynamic sitting and standing activities 75% of the time  Skilled Therapeutic Interventions/Progress Updates:    Pt seated in w/c upon PT arrival, agreeable to therapy tx and denies pain. Pt transported to the gym. Pt performed squat pivot to mat with CGA. Pt performed x 10 sit<>stands this session with CGA-min assist, verbal cues for symmetric weightbearing and maintaining midline. Session focussed on R lateral weightshifting for L foot clearance and also L stance control with quad activation. Pt performed pre-gait stepping in place with R LE, emphasis on maintaining midline with mirror for visual feedback and tactile cues for quad activation and knee extension. Pt worked on standing balance lateral weightshifting to the R without UE support, mirror for visual feedback and tactile cues to touch pts R hip to therapists hip. Combined R lateral weightshift/tactile cue touching hip to hip and then stepping with L LE in place. Performed L lateral side stepping in place with L LE while cueing pt to touch hip to hip for R lateral weightshift, min assist. Pt ambulated x 5 ft without AD and mod assist, emphasis on lateral weightshifting. Pt ambulated x 50 ft this session with RW and min assist, emphasis on R lateral weightshift prior to L swing. Pt transported to dayroom and worked on standing balance/weightshifting while dancing. Pt left to continue therapy in dance group session.   Therapy Documentation Precautions:   Precautions Precautions: Fall Precaution Comments: left hemiparesis, left inattention Restrictions Weight Bearing Restrictions: No   Therapy/Group: Individual Therapy  Netta Corrigan, PT, DPT 04/19/2018, 7:43 AM

## 2018-04-19 NOTE — Progress Notes (Signed)
[place] PHYSICAL MEDICINE & REHABILITATION PROGRESS NOTE   Subjective/Complaints:  Pt eating breakfast. Denies any new complaints. Slept pretty well. Back doesn't seem to be bothering him today  ROS: Patient denies fever, rash, sore throat, blurred vision, nausea, vomiting, diarrhea, cough, shortness of breath or chest pain, joint or back pain, headache, or mood change.   Objective:   No results found. No results for input(s): WBC, HGB, HCT, PLT in the last 72 hours. No results for input(s): NA, K, CL, CO2, GLUCOSE, BUN, CREATININE, CALCIUM in the last 72 hours.  Intake/Output Summary (Last 24 hours) at 04/19/2018 0931 Last data filed at 04/19/2018 0800 Gross per 24 hour  Intake 720 ml  Output 800 ml  Net -80 ml     Physical Exam: Vital Signs Blood pressure 137/61, pulse 66, temperature (!) 97.5 F (36.4 C), temperature source Oral, resp. rate 14, height 5' 9.5" (1.765 m), weight 79.8 kg, SpO2 95 %.  Physical Exam Constitutional: No distress . Vital signs reviewed. HEENT: EOMI, oral membranes moist Neck: supple Cardiovascular: RRR without murmur. No JVD    Respiratory: CTA Bilaterally without wheezes or rales. Normal effort    GI: BS +, non-tender, non-distended  Musc: No edema or tenderness in extremities. Neurological:Alert, improved awareness Dysarthria Motor: LUE: 1+/5 proximal to distal--- stable LLE: 2+/5 HF, KE and 3/5 ADF/PF.  RUE/RLE grossly 4+/5 proximal to distal, stable  MAS 1 in Left finger flexors--no change Skin: Skin iswarm. He isnot diaphoretic.  Psychiatric: He has anormal mood and affect. Hisbehavior is normal.   Assessment/Plan: 1. Functional deficits secondary to RIght BG ICH which require 3+ hours per day of interdisciplinary therapy in a comprehensive inpatient rehab setting.  Physiatrist is providing close team supervision and 24 hour management of active medical problems listed below.  Physiatrist and rehab team continue to assess  barriers to discharge/monitor patient progress toward functional and medical goals  Care Tool:  Bathing    Body parts bathed by patient: Chest, Left arm, Abdomen, Front perineal area, Right upper leg, Left upper leg, Face, Buttocks, Right lower leg, Left lower leg   Body parts bathed by helper: Right lower leg, Left lower leg, Right arm     Bathing assist Assist Level: Minimal Assistance - Patient > 75%     Upper Body Dressing/Undressing Upper body dressing   What is the patient wearing?: Pull over shirt    Upper body assist Assist Level: Minimal Assistance - Patient > 75%    Lower Body Dressing/Undressing Lower body dressing      What is the patient wearing?: Underwear/pull up, Pants     Lower body assist Assist for lower body dressing: Minimal Assistance - Patient > 75%     Toileting Toileting    Toileting assist Assist for toileting: Moderate Assistance - Patient 50 - 74%     Transfers Chair/bed transfer  Transfers assist  Chair/bed transfer activity did not occur: Safety/medical concerns  Chair/bed transfer assist level: Moderate Assistance - Patient 50 - 74%     Locomotion Ambulation   Ambulation assist      Assist level: Moderate Assistance - Patient 50 - 74% Assistive device: Walker-rolling Max distance: 67 ft    Walk 10 feet activity   Assist     Assist level: Moderate Assistance - Patient - 50 - 74% Assistive device: Walker-rolling   Walk 50 feet activity   Assist Walk 50 feet with 2 turns activity did not occur: Safety/medical concerns  Assist level: Moderate Assistance -  Patient - 50 - 74% Assistive device: Walker-rolling    Walk 150 feet activity   Assist Walk 150 feet activity did not occur: Safety/medical concerns  Assist level: 2 helpers Assistive device: Lite Gait    Walk 10 feet on uneven surface  activity   Assist Walk 10 feet on uneven surfaces activity did not occur: Safety/medical concerns          Wheelchair     Assist Will patient use wheelchair at discharge?: No Type of Wheelchair: Manual    Wheelchair assist level: Supervision/Verbal cueing Max wheelchair distance: 100'    Wheelchair 50 feet with 2 turns activity    Assist        Assist Level: Supervision/Verbal cueing   Wheelchair 150 feet activity     Assist     Assist Level: Minimal Assistance - Patient > 75%    Medical Problem List and Plan: 1.  Left side weakness with dysarthria and dysphagia secondary to acute right basal ganglia hemorrhage secondary to hypertensive crisis   Cont CIR PT, OT, SLP  2. DVT Prophylaxis/ No Anticoagulation: SCDs 3. Pain Management:  Tylenol as needed, order resting hand splint 4. Mood:  Provide emotional support 5. Neuropsych: This patient is ?  Fully capable of making decisions on his own behalf. 6. Skin/Wound Care:  Routine skin checks  -Continue Weight shifting, OOB, appropriate nutrition 7. Fluids/Electrolytes/Nutrition:  Routine in and out's , I 4100ml                                     8. Dysphagia. Dysphagia #3 neck thick liquids. Follow-up speech therapy  Advance diet as tolerated 9. Hypertension. Lisinopril 2.5 mg (5 mg PTA) daily. Monitor with increased mobility Vitals:   04/18/18 2020 04/19/18 0416  BP: 132/61 137/61  Pulse: (!) 57 66  Resp: 16 14  Temp: 97.8 F (36.6 C) (!) 97.5 F (36.4 C)  SpO2: 96% 95%   Controlled 1/19 10. History of gout. Allopurinol 300 mg daily. 11. History of CAD with CABG. No chest pain or shortness of breath 12. CKD stage III. Baseline creatinine 1.40  Creatinine 1.29 on 1/8  Continue to monitor 13.  Cough- likely multifactorial - no fever , lung exam normal, hx GERD, cont protonix, SLP to work on RMT 14.  Hypoalbuminemia  Supplement initiated on 1/11 15.  Thrombocytopenia-resolved  Is 123 on 1/8  CBC 1/13 plt nl 16.  Urinary incont- offer urinal or toilet every 3hr while awake    LOS: 12 days A FACE TO  [place] 04/19/2018, 9:31 AM

## 2018-04-20 ENCOUNTER — Inpatient Hospital Stay (HOSPITAL_COMMUNITY): Payer: Medicare Other | Admitting: Speech Pathology

## 2018-04-20 ENCOUNTER — Inpatient Hospital Stay (HOSPITAL_COMMUNITY): Payer: Medicare Other

## 2018-04-20 NOTE — Progress Notes (Signed)
Occupational Therapy Session Note  Patient Details  Name: Jimmy [name] MRN: [id number] Date of Birth: Jan 03, 1929  Today's Date: 04/20/2018 OT Individual Time: 6503-5465 OT Individual Time Calculation (min): 75 min    Short Term Goals: Week 2:  OT Short Term Goal 1 (Week 2): Pt will perform LB dressing tasks with min A OT Short Term Goal 2 (Week 2): Pt will don pull over shirt with supervision after setup OT Short Term Goal 3 (Week 2): Pt will perform toileting tasks with mod A OT Short Term Goal 4 (Week 2): Pt will maintain standing balance with supervision to pull pants over hips  Skilled Therapeutic Interventions/Progress Updates:    OT intervention with focus on BADL retraining (see below), sitting balance, functional transfers, sit<>stand, standing balance, LUE NMR (see below) including forced use for bathing/dressing tasks, and safety awareness to increase independence with BADLs. Pt resting in bed upon arrival and sat EOB with min A using bed rails.  Pt performed squat pivot transfer to w/c with CGA.  Pt required min A for stand pivot transfers w/c<>tub bench with use of grab bars.  Pt performed sit<>stand X 4 in shower with CGA and min A for standing balance.  Pt issued bath mitt for LUE to assist with bathing.  Pt initiates use of LUE during bathing tasks but requires assistance for thoroughness.  Pt requires max verbal cues for sitting/standing balance to correct lean to L when engaged in functional tasks.  Pt required mod verbal cues to attend to his LUE when not engaged in functional task. Pt remained in w/c with RN present administering medications.   Therapy Documentation Precautions:  Precautions Precautions: Fall Precaution Comments: left hemiparesis, left inattention Restrictions Weight Bearing Restrictions: No   Pain:  Pt denies pain ADL: ADL Eating: Minimal assistance Grooming: Setup, Supervision/safety Where Assessed-Grooming: Sitting at sink Upper Body  Bathing: Minimal assistance, Minimal cueing Where Assessed-Upper Body Bathing: Shower Lower Body Bathing: Minimal assistance, Minimal cueing Where Assessed-Lower Body Bathing: Shower Upper Body Dressing: Supervision/safety, Moderate cueing Where Assessed-Upper Body Dressing: Sitting at sink Lower Body Dressing: Moderate assistance, Moderate cueing Where Assessed-Lower Body Dressing: Sitting at sink, Standing at sink Toileting: Moderate assistance Where Assessed-Toileting: Glass blower/designer: Psychiatric nurse Method: Arts development officer: Energy manager: Minimal assistance, Minimal cueing Social research officer, government Method: Radiographer, therapeutic: Grab bars, Transfer tub bench   Other Treatments: Treatments Neuromuscular Facilitation: Left;Forced use;Activity to increase coordination;Activity to increase motor control;Activity to increase timing and sequencing;Activity to increase sustained activation;Activity to increase anterior-posterior weight shifting Weight Bearing Technique Weight Bearing Technique: Yes LUE Weight Bearing Technique: Forearm seated;Extended arm standing   Therapy/Group: Individual Therapy  [name] 04/20/2018, 9:28 AM

## 2018-04-20 NOTE — Progress Notes (Signed)
Physical Therapy Session Note  Patient Details  Name: Jimmy [name] MRN: [id number] Date of Birth: October 13, 1928  Today's Date: 04/20/2018 PT Individual Time: 1300-1415 PT Individual Time Calculation (min): 75 min   Short Term Goals: Week 2:  PT Short Term Goal 1 (Week 2): Pt will perform bed<>chair transfers with CGA PT Short Term Goal 2 (Week 2): Pt will ambulate x 50 ft with LRAD and min assist PT Short Term Goal 3 (Week 2): Pt will demonstrate midline posture during dynamic sitting and standing activities 75% of the time  Skilled Therapeutic Interventions/Progress Updates:    Pt seated in recliner upon PT arrival, agreeable to therapy tx and denies pain. Pt performed stand pivot to w/c with min assist. Pt transported to the gym. Pt transferred from w/c>standing>tall kneeling on mat with UE support on bench and mod assist. In tall kneeling pt worked on postural control, neuro re-ed and hip strengthening while performing reaching task for bean bags, verbal cues for upright posture. Pt transferred to modified quadruped on elbows using bench for UE support working on L UE weightbearing while performing x 10 R arm raises. Pt transferred to sitting with min assist. Pt performed partial sit ups from being reclined on wedge while reaching for bean bags, CGA to min assist with fatigue working on core strengthening. Pt worked on R lateral weightshifting this session in standing with tactile target to touch his hip to therapist's hip while performing L LE activities suchs at stepping forward in place, stepping to the side in place and kicking cones. Pt performed x 10 sit<>stands this session with empahsis on symmetric weightbearing and postural control in standing. Pt ambulated 2 x 40 ft with min assist and RW, L hand orthosis, max verbal cueing for sequencing with tactile cues for R lateral weight shift during L LE swing phase. Pt worked on standing balance and postural control to perform card matching  activity. Pt ambulated back to w/c x40 ft with RW and min assist, fading to mod assist with fatigue and during turns. Pt transported back to room, stand pivot to recliner with min assist and left with needs in reach and chair alarm set.   Therapy Documentation Precautions:  Precautions Precautions: Fall Precaution Comments: left hemiparesis, left inattention Restrictions Weight Bearing Restrictions: No    Therapy/Group: Individual Therapy  Netta Corrigan, PT, DPT 04/20/2018, 7:46 AM

## 2018-04-20 NOTE — Progress Notes (Signed)
[place] PHYSICAL MEDICINE & REHABILITATION PROGRESS NOTE   Subjective/Complaints:  No issues overnite, working on ADL withOT  ROS: Patient denies CP, SOB, N/V/D Objective:   No results found. No results for input(s): WBC, HGB, HCT, PLT in the last 72 hours. No results for input(s): NA, K, CL, CO2, GLUCOSE, BUN, CREATININE, CALCIUM in the last 72 hours.  Intake/Output Summary (Last 24 hours) at 04/20/2018 0805 Last data filed at 04/19/2018 2231 Gross per 24 hour  Intake 340 ml  Output 250 ml  Net 90 ml     Physical Exam: Vital Signs Blood pressure 131/68, pulse 60, temperature 98 F (36.7 C), temperature source Oral, resp. rate 18, height 5' 9.5" (1.765 m), weight 79.8 kg, SpO2 95 %.  Physical Exam Constitutional: No distress . Vital signs reviewed. HEENT: EOMI, oral membranes moist Neck: supple Cardiovascular: RRR without murmur. No JVD    Respiratory: CTA Bilaterally without wheezes or rales. Normal effort    GI: BS +, non-tender, non-distended  Musc: No edema or tenderness in extremities. Neurological:Alert, improved awareness Dysarthria Motor: LUE: 1+/5 proximal to distal--- stable LLE: 2+/5 HF, KE and 3/5 ADF/PF.  RUE/RLE grossly 4+/5 proximal to distal, stable  MAS 1 in Left finger flexors--no change Skin: Skin iswarm. He isnot diaphoretic.  Psychiatric: He has anormal mood and affect. Hisbehavior is normal.   Assessment/Plan: 1. Functional deficits secondary to RIght BG ICH which require 3+ hours per day of interdisciplinary therapy in a comprehensive inpatient rehab setting.  Physiatrist is providing close team supervision and 24 hour management of active medical problems listed below.  Physiatrist and rehab team continue to assess barriers to discharge/monitor patient progress toward functional and medical goals  Care Tool:  Bathing    Body parts bathed by patient: Chest, Left arm, Abdomen, Front perineal area, Right upper leg, Left upper leg,  Face, Buttocks, Right lower leg, Left lower leg   Body parts bathed by helper: Right lower leg, Left lower leg, Right arm     Bathing assist Assist Level: Minimal Assistance - Patient > 75%     Upper Body Dressing/Undressing Upper body dressing   What is the patient wearing?: Pull over shirt    Upper body assist Assist Level: Minimal Assistance - Patient > 75%    Lower Body Dressing/Undressing Lower body dressing      What is the patient wearing?: Underwear/pull up, Pants     Lower body assist Assist for lower body dressing: Minimal Assistance - Patient > 75%     Toileting Toileting    Toileting assist Assist for toileting: Moderate Assistance - Patient 50 - 74%     Transfers Chair/bed transfer  Transfers assist  Chair/bed transfer activity did not occur: Safety/medical concerns  Chair/bed transfer assist level: Minimal Assistance - Patient > 75%     Locomotion Ambulation   Ambulation assist      Assist level: Minimal Assistance - Patient > 75% Assistive device: Walker-rolling Max distance: 50 ft   Walk 10 feet activity   Assist     Assist level: Minimal Assistance - Patient > 75% Assistive device: Walker-rolling   Walk 50 feet activity   Assist Walk 50 feet with 2 turns activity did not occur: Safety/medical concerns  Assist level: Minimal Assistance - Patient > 75% Assistive device: Walker-rolling    Walk 150 feet activity   Assist Walk 150 feet activity did not occur: Safety/medical concerns  Assist level: 2 helpers Assistive device: Lite Gait    Walk 10  feet on uneven surface  activity   Assist Walk 10 feet on uneven surfaces activity did not occur: Safety/medical concerns         Wheelchair     Assist Will patient use wheelchair at discharge?: No Type of Wheelchair: Manual    Wheelchair assist level: Supervision/Verbal cueing Max wheelchair distance: 100'    Wheelchair 50 feet with 2 turns  activity    Assist        Assist Level: Supervision/Verbal cueing   Wheelchair 150 feet activity     Assist     Assist Level: Minimal Assistance - Patient > 75%    Medical Problem List and Plan: 1.  Left side weakness with dysarthria and dysphagia secondary to acute right basal ganglia hemorrhage secondary to hypertensive crisis   Cont CIR PT, OT, SLP  2. DVT Prophylaxis/ No Anticoagulation: SCDs 3. Pain Management:  Tylenol as needed, order resting hand splint 4. Mood:  Provide emotional support 5. Neuropsych: This patient is ?  Fully capable of making decisions on his own behalf. 6. Skin/Wound Care:  Routine skin checks  -Continue Weight shifting, OOB, appropriate nutrition 7. Fluids/Electrolytes/Nutrition:  Routine in and out's , I 431ml                                     8. Dysphagia. Dysphagia #3 neck thick liquids. Follow-up speech therapy  Advance diet as tolerated 9. Hypertension. Lisinopril 2.5 mg (5 mg PTA) daily. Monitor with increased mobility Vitals:   04/19/18 2001 04/20/18 0406  BP: (!) 155/64 131/68  Pulse: (!) 55 60  Resp: 18 18  Temp: 98 F (36.7 C) 98 F (36.7 C)  SpO2: 94% 95%   Controlled 1/20 10. History of gout. Allopurinol 300 mg daily. 11. History of CAD with CABG. No chest pain or shortness of breath 12. CKD stage III. Baseline creatinine 1.40  Creatinine 1.29 on 1/8  Continue to monitor 13.  Cough- likely multifactorial - no fever , lung exam normal, hx GERD, cont protonix, SLP to work on RMT 14.  Hypoalbuminemia  Supplement initiated on 1/11  16.  Urinary incont- offer urinal or toilet every 3hr while awake    LOS: 13 days A FACE TO [employer] [facility] [place] 04/20/2018, 8:05 AM

## 2018-04-20 NOTE — Progress Notes (Signed)
Speech Language Pathology Daily Session Note  Patient Details  Name: Jimmy [name] MRN: [id number] Date of Birth: 1928/08/05  Today's Date: 04/20/2018 SLP Individual Time: 8381-8403 SLP Individual Time Calculation (min): 45 min  Short Term Goals: Week 2: SLP Short Term Goal 1 (Week 2): Patient will consume current diet with minimal overt s/s of aspiration and Min A verbal cues for use fo swallowing compensatory strategies.  SLP Short Term Goal 2 (Week 2): Patient will consume trials of thin liquids with a head turn to the left with minimal overt s/s of aspiration over 2 sessions prior to initiating the water protocol.  SLP Short Term Goal 3 (Week 2): Patient will demonstrate selective attention to functional tasks in a minimally distracting enviornment for 10 minutes with Min A verbal cues for redirection.  SLP Short Term Goal 4 (Week 2): Patient will demonstrate functional problem solving for basic and familiar tasks with Mod A verbal cues.  SLP Short Term Goal 5 (Week 2): Patient will attend/scan to left field of enviornment during functional tasks with Min A verbal cues.   Skilled Therapeutic Interventions: Skilled treatment session focused on cognitive and dysphagia goals. SLP facilitated session by providing Min A verbal cues for accuracy with EMST exercises due to difficulty with increased strength, therefore, EMST device moved back down to 30 cm H2O. Patient performed oral care via suction toothbrush with Mod I and consumed trials of ice chips without overt s/s of aspiration with his head turned to the left. However, patient with intermittent overt s/s of aspiration with tsp sips of thin liquids, suspect due to impaired timing/coordination. Recommend repeat MBS tomorrow to assess swallow function and progress. Patient transferred to the recliner at end of session and wife present. Continue with current plan of care.      Pain Pain Assessment Pain Scale: 0-10 Pain Score: 0-No  pain  Therapy/Group: Individual Therapy  Syana Degraffenreid 04/20/2018, 10:55 AM

## 2018-04-21 ENCOUNTER — Ambulatory Visit (HOSPITAL_COMMUNITY): Payer: Medicare Other | Admitting: Speech Pathology

## 2018-04-21 ENCOUNTER — Inpatient Hospital Stay (HOSPITAL_COMMUNITY): Payer: Medicare Other

## 2018-04-21 ENCOUNTER — Encounter (HOSPITAL_COMMUNITY): Payer: Medicare Other | Admitting: Speech Pathology

## 2018-04-21 ENCOUNTER — Inpatient Hospital Stay (HOSPITAL_COMMUNITY): Payer: Self-pay

## 2018-04-21 NOTE — Progress Notes (Signed)
Occupational Therapy Session Note  Patient Details  Name: Jimmy [name] MRN: [id number] Date of Birth: 11-08-1928  Today's Date: 04/21/2018 OT Individual Time: 4008-6761 OT Individual Time Calculation (min): 75 min    Short Term Goals: Week 2:  OT Short Term Goal 1 (Week 2): Pt will perform LB dressing tasks with min A OT Short Term Goal 2 (Week 2): Pt will don pull over shirt with supervision after setup OT Short Term Goal 3 (Week 2): Pt will perform toileting tasks with mod A OT Short Term Goal 4 (Week 2): Pt will maintain standing balance with supervision to pull pants over hips  Skilled Therapeutic Interventions/Progress Updates:    OT intervention with focus on BADL retraining (see below), sitting balance, functional transfers, standing balance, forced LUE use, attention to L, attention to task, and safety awareness to increase independence with BADLs. Pt resting in bed upon arrival an agreeable to therapy.  Pt required min A for bed mobility with mod verbal cues for sequencing and safety.  Pt performed squat pivot transfer with min A.  Pt exhibited increased lean to L with functional tasks this morning and required max multimodal cues to maintain sitting balance and standing balance when engaged in functional tasks.  Pt initiates use of LUE but requires assistance to complete tasks.  Pt requires mod verbal cues for attnention to task and is easily distracted.  Pt returned to bed awaiting transport for MBS. Bed alarm activated and all needs within reach.   Therapy Documentation Precautions:  Precautions Precautions: Fall Precaution Comments: left hemiparesis, left inattention Restrictions Weight Bearing Restrictions: No Pain:  Pt denies pain ADL: ADL Eating: Minimal assistance Grooming: Setup, Supervision/safety Where Assessed-Grooming: Sitting at sink Upper Body Bathing: Minimal assistance, Minimal cueing Where Assessed-Upper Body Bathing: Shower Lower Body Bathing: Minimal  assistance, Minimal cueing Where Assessed-Lower Body Bathing: Shower Upper Body Dressing: Minimal assistance, Moderate cueing Where Assessed-Upper Body Dressing: Sitting at sink Lower Body Dressing: Minimal assistance, Moderate cueing Where Assessed-Lower Body Dressing: Sitting at sink, Standing at sink Toileting: Moderate assistance Where Assessed-Toileting: Glass blower/designer: Psychiatric nurse Method: Arts development officer: Energy manager: Minimal assistance, Minimal cueing Social research officer, government Method: Radiographer, therapeutic: Grab bars, Transfer tub bench   Therapy/Group: Individual Therapy  [name] 04/21/2018, 9:29 AM

## 2018-04-21 NOTE — Progress Notes (Signed)
Modified Barium Swallow Progress Note  Patient Details  Name: Jimmy [name] MRN: [id number] Date of Birth: [birth year]  Today's Date: 04/21/2018  Modified Barium Swallow completed.  Full report located under Chart Review in the Imaging Section.  Brief recommendations include the following:  Clinical Impression  Patient continues to demonstrate a moderate primary pharyngeal dysphagia, however, patient's overall ability to follow commands for use of compensatory strategies has improved. 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 Patient consistently triggers a swallow with all liquids at the pyriform sinuses with decreased laryngeal closure resulting in intermittent sensed penetration and aspiration of both nectar-thick and thin liquids. Penetration was cleared, however, some aspirated remained within the trachea despite cough response. 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 Mild-Moderate pharyngeal residue noted due to decreased pharyngeal constriction which was reduced with multiple, effortful swallows. Recommend patient continue Dys 3 textures and upgrade to thin liquids with use of a head turn to the left with multiple swallows. Patient provided extensive education and verbalized understanding.    Swallow Evaluation Recommendations       SLP Diet Recommendations: Dysphagia 3 (Mech soft) solids;Thin liquid   Liquid Administration via: Straw   Medication Administration: Whole meds with puree   Supervision: Patient able to self feed;Full supervision/cueing for compensatory strategies   Compensations: Slow rate;Small sips/bites;Minimize environmental distractions;Effortful swallow;Multiple dry swallows after each bite/sip   Postural Changes: Seated upright at 90 degrees;Other (Comment)(head turn to left  )   Oral Care Recommendations: Oral care BID        [name] 04/21/2018,3:19 PM

## 2018-04-21 NOTE — Progress Notes (Signed)
[place] PHYSICAL MEDICINE & REHABILITATION PROGRESS NOTE   Subjective/Complaints:  Still with severe left neglect but movement increasing, needng minA for UE ADL  ROS: Patient denies CP, SOB, N/V/D Objective:   No results found. No results for input(s): WBC, HGB, HCT, PLT in the last 72 hours. No results for input(s): NA, K, CL, CO2, GLUCOSE, BUN, CREATININE, CALCIUM in the last 72 hours.  Intake/Output Summary (Last 24 hours) at 04/21/2018 0755 Last data filed at 04/21/2018 0555 Gross per 24 hour  Intake 240 ml  Output 625 ml  Net -385 ml     Physical Exam: Vital Signs Blood pressure (!) 128/56, pulse (!) 54, temperature 98 F (36.7 C), temperature source Oral, resp. rate 16, height 5' 9.5" (1.765 m), weight 79.8 kg, SpO2 95 %.  Physical Exam Constitutional: No distress . Vital signs reviewed. HEENT: EOMI, oral membranes moist Neck: supple Cardiovascular: RRR without murmur. No JVD    Respiratory: CTA Bilaterally without wheezes or rales. Normal effort    GI: BS +, non-tender, non-distended  Musc: No edema or tenderness in extremities. Neurological:Alert, improved awareness Dysarthria Motor: LUE: 1+/5 proximal to distal--- stable LLE: 2+/5 HF, KE and 3/5 ADF/PF.  RUE/RLE grossly 4+/5 proximal to distal, stable  MAS 1 in Left finger flexors--no change Skin: Skin iswarm. He isnot diaphoretic.  Psychiatric: He has anormal mood and affect. Hisbehavior is normal.   Assessment/Plan: 1. Functional deficits secondary to RIght BG ICH which require 3+ hours per day of interdisciplinary therapy in a comprehensive inpatient rehab setting.  Physiatrist is providing close team supervision and 24 hour management of active medical problems listed below.  Physiatrist and rehab team continue to assess barriers to discharge/monitor patient progress toward functional and medical goals  Care Tool:  Bathing    Body parts bathed by patient: Chest, Left arm, Abdomen, Front  perineal area, Right upper leg, Left upper leg, Face, Buttocks, Right lower leg, Left lower leg   Body parts bathed by helper: Right lower leg, Left lower leg, Right arm     Bathing assist Assist Level: Minimal Assistance - Patient > 75%     Upper Body Dressing/Undressing Upper body dressing   What is the patient wearing?: Pull over shirt    Upper body assist Assist Level: Minimal Assistance - Patient > 75%    Lower Body Dressing/Undressing Lower body dressing      What is the patient wearing?: Underwear/pull up, Pants     Lower body assist Assist for lower body dressing: Minimal Assistance - Patient > 75%     Toileting Toileting    Toileting assist Assist for toileting: Moderate Assistance - Patient 50 - 74%     Transfers Chair/bed transfer  Transfers assist  Chair/bed transfer activity did not occur: Safety/medical concerns  Chair/bed transfer assist level: Minimal Assistance - Patient > 75%     Locomotion Ambulation   Ambulation assist      Assist level: Minimal Assistance - Patient > 75% Assistive device: Walker-rolling Max distance: 40 ft   Walk 10 feet activity   Assist     Assist level: Minimal Assistance - Patient > 75% Assistive device: Walker-rolling   Walk 50 feet activity   Assist Walk 50 feet with 2 turns activity did not occur: Safety/medical concerns  Assist level: Minimal Assistance - Patient > 75% Assistive device: Walker-rolling    Walk 150 feet activity   Assist Walk 150 feet activity did not occur: Safety/medical concerns  Assist level: 2 helpers Assistive  device: Lite Gait    Walk 10 feet on uneven surface  activity   Assist Walk 10 feet on uneven surfaces activity did not occur: Safety/medical concerns         Wheelchair     Assist Will patient use wheelchair at discharge?: No Type of Wheelchair: Manual    Wheelchair assist level: Supervision/Verbal cueing Max wheelchair distance: 100'     Wheelchair 50 feet with 2 turns activity    Assist        Assist Level: Supervision/Verbal cueing   Wheelchair 150 feet activity     Assist     Assist Level: Minimal Assistance - Patient > 75%    Medical Problem List and Plan: 1.  Left side weakness with dysarthria and dysphagia secondary to acute right basal ganglia hemorrhage secondary to hypertensive crisis   Cont CIR PT, OT, SLP Team conf in am2. DVT Prophylaxis/ No Anticoagulation: SCDs 3. Pain Management:  Tylenol as needed, order resting hand splint 4. Mood:  Provide emotional support 5. Neuropsych: This patient is ?  Fully capable of making decisions on his own behalf. 6. Skin/Wound Care:  Routine skin checks  -Continue Weight shifting, OOB, appropriate nutrition 7. Fluids/Electrolytes/Nutrition:  Routine in and out's , I 445ml                                     8. Dysphagia. Dysphagia #3 neck thick liquids. Follow-up speech therapy  Advance diet as tolerated 9. Hypertension. Lisinopril 2.5 mg (5 mg PTA) daily. Monitor with increased mobility Vitals:   04/20/18 1955 04/21/18 0555  BP: (!) 129/54 (!) 128/56  Pulse: (!) 59 (!) 54  Resp: 16 16  Temp: 98 F (36.7 C) 98 F (36.7 C)  SpO2: 94% 95%   Controlled 1/21 10. History of gout. Allopurinol 300 mg daily. 11. History of CAD with CABG. No chest pain or shortness of breath 12. CKD stage III. Baseline creatinine 1.40  Creatinine 1.29 on 1/8  Continue to monitor 13.  Cough- likely multifactorial - no fever , lung exam normal, hx GERD, cont protonix, SLP to work on RMT 14.  Hypoalbuminemia  Supplement initiated on 1/11  16.  Urinary incont- offer urinal or toilet every 3hr while awake    LOS: 14 days A FACE TO [place] Jule Schlabach 04/21/2018, 7:55 AM

## 2018-04-21 NOTE — Progress Notes (Signed)
Physical Therapy Session Note  Patient Details  Name: Jimmy [name] MRN: [id number] Date of Birth: January 07, 1929  Today's Date: 04/21/2018 PT Individual Time: 9357-0177 PT Individual Time Calculation (min): 70 min   Short Term Goals: Week 2:  PT Short Term Goal 1 (Week 2): Pt will perform bed<>chair transfers with CGA PT Short Term Goal 2 (Week 2): Pt will ambulate x 50 ft with LRAD and min assist PT Short Term Goal 3 (Week 2): Pt will demonstrate midline posture during dynamic sitting and standing activities 75% of the time  Skilled Therapeutic Interventions/Progress Updates:    Pt supine in bed upon PT arrival, agreeable to therapy tx and denies pain. Pt transferred to sitting with supervision and increased time. Pt performed stand pivot to the w/c with min assist and transported to the gym. Pt ambulated x 50 ft and x 70 ft this session with RW and min assist, max verbal cues for attention to task, foot placement and sequencing, tactile cues for R lateral weightshifting. Pt ascended/descended 4 steps this session with R rail and step to pattern, verbal cues for techniques, verbal cues for attention and safety awareness. Pt transferred to tall kneeling on mat this session with bench for UE support, mod assist to get into position. In tall kneeling pt worked on postural control, weight shifting and a reaching task to R side, verbal cues for upright posture and tactile cues for hip extension. Pt transferred to sitting with min assist and to supine with min assist. Therapist performed L LE hip stretching, pt lacking L hip internal rotation and demonstrates L hip adductor and hamstring spasticity (2 on the modified ashworth scale). Pt performed 2 x 10 bridges in supine for strengthening and performed L hip abduction in hooklying against manual resistance. Pt performed PNF against manual resistance D1 extension and performed D1 flexion AROM for neuro re-ed. Pt transferred to sitting with min assist and  transferred to w/c with min assist, transported back to room and left in w/c with needs in reach and chair alarm set.    Therapy Documentation Precautions:  Precautions Precautions: Fall Precaution Comments: left hemiparesis, left inattention Restrictions Weight Bearing Restrictions: No    Therapy/Group: Individual Therapy  Netta Corrigan, PT, DPT 04/21/2018, 12:13 PM

## 2018-04-21 NOTE — Progress Notes (Signed)
Speech Language Pathology Weekly Progress and Session Note  Patient Details  Name: Jimmy [name] MRN: [id number] Date of Birth: 1928-06-13  Beginning of progress report period: April 14, 2018 End of progress report period: April 21, 2018  Today's Date: 04/21/2018 SLP Individual Time: 1130-1200 SLP Individual Time Calculation (min): 30 min  Short Term Goals: Week 2: SLP Short Term Goal 1 (Week 2): Patient will consume current diet with minimal overt s/s of aspiration and Min A verbal cues for use fo swallowing compensatory strategies.  SLP Short Term Goal 1 - Progress (Week 2): Met SLP Short Term Goal 2 (Week 2): Patient will consume trials of thin liquids with a head turn to the left with minimal overt s/s of aspiration over 2 sessions prior to initiating the water protocol.  SLP Short Term Goal 2 - Progress (Week 2): Met SLP Short Term Goal 3 (Week 2): Patient will demonstrate selective attention to functional tasks in a minimally distracting enviornment for 10 minutes with Min A verbal cues for redirection.  SLP Short Term Goal 3 - Progress (Week 2): Met SLP Short Term Goal 4 (Week 2): Patient will demonstrate functional problem solving for basic and familiar tasks with Mod A verbal cues.  SLP Short Term Goal 4 - Progress (Week 2): Met SLP Short Term Goal 5 (Week 2): Patient will attend/scan to left field of enviornment during functional tasks with Min A verbal cues.  SLP Short Term Goal 5 - Progress (Week 2): Met    New Short Term Goals: Week 3: SLP Short Term Goal 1 (Week 3): Patient will consume current diet with minimal overt s/s of aspiration with supervision verbal cues for use of compensatory strategies.  SLP Short Term Goal 2 (Week 3): Patient will perform RMT exericses with Mod I with a self-percieved effort level of 7/10.  SLP Short Term Goal 3 (Week 3): Patient will demonstrate selective attention to functional tasks in a minimally distracting enviornment for 30 minutes  with Min A verbal cues for redirection.  SLP Short Term Goal 4 (Week 3): Patient will demonstrate functional problem solving for basic and familiar tasks with Min A verbal cues.  SLP Short Term Goal 5 (Week 3): Patient will attend/scan to left field of enviornment during functional tasks with Supervision verbal cues.   Weekly Progress Updates: Patient has made excellent gains and has met 5 of 5 STGs this reporting period. Currently, patient had a repeat MBS and is consuming Dys. 3 textures with thin liquids with minimal overt s/s of aspiration and overall Min A verbal cues for use of swallowing compensatory strategies. Patient is performing RMT exercises with overall Mod I and requires Min A-Supervision verbal cues for functional problem solving, selective attention and attention to left field of environment. Patient and family education ongoing. Patient would benefit from continued skilled SLP intervention to maximize his swallowing and cognitive functioning prior to discharge.       Intensity: Minumum of 1-2 x/day, 30 to 90 minutes Frequency: 3 to 5 out of 7 days Duration/Length of Stay: 1/29 Treatment/Interventions: Cognitive remediation/compensation;Environmental controls;Speech/Language facilitation;Therapeutic Activities;Patient/family education;Dysphagia/aspiration precaution training;Cueing hierarchy;Functional tasks;Internal/external aids   Daily Session  Skilled Therapeutic Interventions: Skilled treatment session focused on dysphagia goals. SLP facilitated session by providing supervision-Min A verbal cues for use of head turn to the left and multiple swallows with upgraded lunch meal of Dys. 3 textures with thin liquids via straw. Patient consumed meal without overt s/s of aspiration, therefore, recommend patient continue current diet and  continue full supervision. Patient handed off to NT. Contineu with current plan of care.      Pain No/Denies Pain   Therapy/Group: Individual  Therapy  Blong Busk 04/21/2018, 3:23 PM

## 2018-04-22 ENCOUNTER — Inpatient Hospital Stay (HOSPITAL_COMMUNITY): Payer: Medicare Other | Admitting: Speech Pathology

## 2018-04-22 ENCOUNTER — Inpatient Hospital Stay (HOSPITAL_COMMUNITY): Payer: Medicare Other

## 2018-04-22 ENCOUNTER — Inpatient Hospital Stay (HOSPITAL_COMMUNITY): Payer: Self-pay | Admitting: Physical Therapy

## 2018-04-22 NOTE — Progress Notes (Signed)
Occupational Therapy Session Note  Patient Details  Name: Jimmy [name] MRN: [id number] Date of Birth: [birth year]  Today's Date: 04/22/2018 OT Individual Time: 8891-6945 OT Individual Time Calculation (min): 75 min    Short Term Goals: Week 2:  OT Short Term Goal 1 (Week 2): Pt will perform LB dressing tasks with min A OT Short Term Goal 2 (Week 2): Pt will don pull over shirt with supervision after setup OT Short Term Goal 3 (Week 2): Pt will perform toileting tasks with mod A OT Short Term Goal 4 (Week 2): Pt will maintain standing balance with supervision to pull pants over hips  Skilled Therapeutic Interventions/Progress Updates:    OT intervention with focus on BADL retraining, bed mobility, functional transfers, sitting balance, standing balance, attention to L, LUE forced use, and safety awareness to increase independence with BADLs.  Pt continues to require mod verbal cues for postural control when engaged in sitting and standing activities.  Pt with L lean which he can correct when cured.  Pt requires min verbal cues for redirection to task.  See below for BADL assist levels.  Pt initiates use of LUE but requires assistance to complete functional tasks.  Pt required min/mod verbal cues for LUE positioning when not engaged in functional task.  Pt remained in w/c with all needs within reach and belt alarm activated.   Therapy Documentation Precautions:  Precautions Precautions: Fall Precaution Comments: left hemiparesis, left inattention Restrictions Weight Bearing Restrictions: No Pain:  Pt denies pain this morning ADL: ADL Eating: Minimal assistance Grooming: Setup, Supervision/safety Where Assessed-Grooming: Sitting at sink Upper Body Bathing: Supervision, Minimal cueing Where Assessed-Upper Body Bathing: Shower Lower Body Bathing: Minimal assistance, Minimal cueing Where Assessed-Lower Body Bathing: Shower Upper Body Dressing: Minimal assistance, Moderate  cueing Where Assessed-Upper Body Dressing: Sitting at sink Lower Body Dressing: Minimal assistance, Moderate cueing Where Assessed-Lower Body Dressing: Sitting at sink, Standing at sink Toileting: Minimal assistance Where Assessed-Toileting: Glass blower/designer: Psychiatric nurse Method: Arts development officer: Energy manager: Minimal assistance, Minimal cueing Social research officer, government Method: Radiographer, therapeutic: Grab bars, Transfer tub bench   Therapy/Group: Individual Therapy  [name] 04/22/2018, 8:16 AM

## 2018-04-22 NOTE — Plan of Care (Signed)
  Problem: Consults Goal: RH STROKE PATIENT EDUCATION Description See Patient Education module for education specifics, pt / fam verb understanding with min A  Outcome: Progressing Goal: Nutrition Consult-if indicated Outcome: Progressing   Problem: RH BOWEL ELIMINATION Goal: RH STG MANAGE BOWEL WITH ASSISTANCE Description STG Manage Bowel with min  Assistance.  Outcome: Progressing Goal: RH STG MANAGE BOWEL W/MEDICATION W/ASSISTANCE Description STG Manage Bowel with Medication with min Assistance.  Outcome: Progressing   Problem: RH BLADDER ELIMINATION Goal: RH STG MANAGE BLADDER WITH ASSISTANCE Description STG Manage Bladder With min Assistance  Outcome: Progressing   Problem: RH SAFETY Goal: RH STG ADHERE TO SAFETY PRECAUTIONS W/ASSISTANCE/DEVICE Description STG Adhere to Safety Precautions With min Assistance/Device.  Outcome: Progressing

## 2018-04-22 NOTE — Progress Notes (Signed)
[place] PHYSICAL MEDICINE & REHABILITATION PROGRESS NOTE   Subjective/Complaints:  Discussed swallowing function with SLP  ROS: Patient denies CP, SOB, N/V/D Objective:   Dg Swallowing Func-speech Pathology  Result Date: 04/21/2018 Objective Swallowing Evaluation: Type of Study: MBS-Modified Barium Swallow Study  Patient Details Name: Jimmy [name] MRN: [id number] Date of Birth: 1929-02-12 Today's Date: 04/21/2018 Time: SLP Start Time (ACUTE ONLY): 0900 -SLP Stop Time (ACUTE ONLY): 0930 SLP Time Calculation (min) (ACUTE ONLY): 30 min Past Medical History: Past Medical History: Diagnosis Date . CAD (coronary artery disease)   a. 04/2001 S/P CABGx 4;  b. 2008 MV:  EF 63% normal perfusion. . Carotid stenosis   a. 11/2012 Carotid U/S:  RICA 76-16%, LICA 07-37%. . Chronic kidney disease, stage 3 ([place])  . CLL (chronic lymphoblastic leukemia) 2009  Stage IV; Dr. Ivor Messier - referred to Dr. Lissa Merlin [facility] 07/2013 now on Rituxan (10/2013) --> stage 0 (09/2014) . Diastolic CHF ([place])   a. 04/624 Echo: EF 55-65%, mild conc LVH, Gr 1 DD, mild AS, triv AI, mildly dil Ao root (3.5 cm). . GERD (gastroesophageal reflux disease) 1990s . History of CVA (cerebrovascular accident) 2015  by MRI - remote L internal capsule . History of herpes genitalis   valtrex daily . Hyperlipemia 2002 . Hypertension 2002 . Mass of submandibular region 2015  referred to gen surg after chemo . Mild aortic stenosis   a. 07/2011 Echo: Mild AS, triv AI. [name] Stroke ([place])  . Thrombocytopenia ([place])   outpatient goals Hgb >9, plt >20 . TIA (transient ischemic attack)   04/2016 Past Surgical History: Past Surgical History: Procedure Laterality Date . ANGIOPLASTY  1998 . CATARACT EXTRACTION, BILATERAL    R 1/09, L 8/09 . COLONOSCOPY  11/29/1987  Normal . COLONOSCOPY  02/07/2003  Hemm. Internal, focal proctitis, negative biopsy . COLONOSCOPY  12/12/2004  Internal external hemorrhoids, +proctits, negative biopsy . CORONARY ANGIOPLASTY  1998 . CORONARY ARTERY  BYPASS GRAFT  04/23/2001  x4, Dr. Pia Mau . ESOPHAGOGASTRODUODENOSCOPY  11/29/1987  Gastritis . ETT  12/10/2006  Persantine myoview nml . HEMORROIDECTOMY  1954  Fissure repair, [place] . HEPATIC ARTERY ANGIOPLASTY  1954  [place], [place] . KIDNEY STONE SURGERY  1977  Dr Redmond Baseman . LITHOTRIPSY  1990s  Multiple . US ECHOCARDIOGRAPHY  07/2011  nl sys fxn, EF 55-60%, grade 1 diast dysfunction, mild AS, mildly elevated PA pressure HPI: 85 year old male admitted with left sided weakness and facial droop. CT scan showed right basal ganglia hemorrage. Patient given ativan for myoclonic jerking vs seizure activity on arrival with subsequent sedation. Also with h/o CVA and TIA, GERD.  No data recorded Assessment / Plan / Recommendation CHL IP CLINICAL IMPRESSIONS 04/21/2018 Clinical Impression Patient continues to demonstrate a moderate primary pharyngeal dysphagia, however, patient's overall ability to follow commands for use of compensatory strategies has improved. 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 Patient consistently triggers a swallow with all liquids at the pyriform sinuses with decreased laryngeal closure resulting in intermittent sensed penetration and aspiration of both nectar-thick and thin liquids. Penetration was cleared, however, some aspirated remained within the trachea despite cough response. 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 Mild-Moderate pharyngeal residue noted due to decreased pharyngeal constriction which was reduced with multiple, effortful swallows. Recommend patient continue Dys 3 textures and upgrade to thin liquids with  use of a head turn to the left with multiple swallows. Patient provided extensive education and verbalized understanding.  SLP Visit Diagnosis Dysphagia, pharyngeal phase (R13.13) Attention and  concentration deficit following -- Frontal lobe and executive function deficit following -- Impact on safety and function Moderate aspiration risk   CHL IP TREATMENT RECOMMENDATION 04/21/2018 Treatment Recommendations Therapy as outlined in treatment plan below   Prognosis 04/21/2018 Prognosis for Safe Diet Advancement Good Barriers to Reach Goals -- Barriers/Prognosis Comment -- CHL IP DIET RECOMMENDATION 04/21/2018 SLP Diet Recommendations Dysphagia 3 (Mech soft) solids;Thin liquid Liquid Administration via Straw Medication Administration Whole meds with puree Compensations Slow rate;Small sips/bites;Minimize environmental distractions;Effortful swallow;Multiple dry swallows after each bite/sip Postural Changes Seated upright at 90 degrees;Other (Comment)   CHL IP OTHER RECOMMENDATIONS 04/21/2018 Recommended Consults -- Oral Care Recommendations Oral care BID Other Recommendations --   CHL IP FOLLOW UP RECOMMENDATIONS 04/21/2018 Follow up Recommendations 24 hour supervision/assistance;Inpatient Rehab   CHL IP FREQUENCY AND DURATION 04/21/2018 Speech Therapy Frequency (ACUTE ONLY) min 3x week Treatment Duration 1 week      CHL IP ORAL PHASE 04/21/2018 Oral Phase Impaired Oral - Pudding Teaspoon -- Oral - Pudding Cup -- Oral - Honey Teaspoon NT Oral - Honey Cup -- Oral - Nectar Teaspoon NT Oral - Nectar Cup WFL Oral - Nectar Straw WFL Oral - Thin Teaspoon -- Oral - Thin Cup WFL Oral - Thin Straw WFL Oral - Puree WFL Oral - Mech Soft Impaired mastication;Left pocketing in lateral sulci Oral - Regular -- Oral - Multi-Consistency -- Oral - Pill -- Oral Phase - Comment --  CHL IP PHARYNGEAL PHASE 04/21/2018 Pharyngeal Phase Impaired Pharyngeal- Pudding Teaspoon -- Pharyngeal -- Pharyngeal- Pudding Cup -- Pharyngeal -- Pharyngeal- Honey Teaspoon NT Pharyngeal -- Pharyngeal- Honey Cup NT Pharyngeal -- Pharyngeal- Nectar Teaspoon NT Pharyngeal -- Pharyngeal- Nectar Cup Delayed swallow initiation-pyriform  sinuses;Penetration/Aspiration during swallow;Reduced airway/laryngeal closure;Pharyngeal residue - pyriform;Pharyngeal residue - valleculae;Compensatory strategies attempted (with notebox);Reduced pharyngeal peristalsis Pharyngeal Material enters airway, CONTACTS cords and then ejected out Pharyngeal- Nectar Straw Delayed swallow initiation-pyriform sinuses;Reduced airway/laryngeal closure;Penetration/Aspiration during swallow;Pharyngeal residue - pyriform;Pharyngeal residue - valleculae;Reduced pharyngeal peristalsis;Compensatory strategies attempted (with notebox) Pharyngeal Material does not enter airway;Material enters airway, passes BELOW cords then ejected out Pharyngeal- Thin Teaspoon -- Pharyngeal -- Pharyngeal- Thin Cup NT Pharyngeal -- Pharyngeal- Thin Straw Delayed swallow initiation-pyriform sinuses;Reduced pharyngeal peristalsis;Reduced airway/laryngeal closure;Pharyngeal residue - pyriform;Pharyngeal residue - valleculae;Penetration/Aspiration during swallow;Compensatory strategies attempted (with notebox) Pharyngeal Material does not enter airway;Material enters airway, passes BELOW cords and not ejected out despite cough attempt by patient;Material enters airway, remains ABOVE vocal cords then ejected out Pharyngeal- Puree Delayed swallow initiation-vallecula;Compensatory strategies attempted (with notebox);Reduced airway/laryngeal closure;Penetration/Aspiration during swallow Pharyngeal Material enters airway, remains ABOVE vocal cords then ejected out Pharyngeal- Mechanical Soft Delayed swallow initiation-vallecula;Compensatory strategies attempted (with notebox) Pharyngeal -- Pharyngeal- Regular -- Pharyngeal -- Pharyngeal- Multi-consistency -- Pharyngeal -- Pharyngeal- Pill -- Pharyngeal -- Pharyngeal Comment --  No flowsheet data found. PAYNE, [place] 04/21/2018, 3:19 PM    Weston Anna, MA, CCC-SLP [phone]           No results for input(s): WBC, HGB, HCT, PLT in the last 72 hours. No  results for input(s): NA, K, CL, CO2, GLUCOSE, BUN, CREATININE, CALCIUM in the last 72 hours.  Intake/Output Summary (Last 24 hours) at 04/22/2018 0844 Last data filed at 04/22/2018 0738 Gross per 24 hour  Intake -  Output 968 ml  Net -968 ml     Physical Exam: Vital Signs  Blood pressure (!) 146/63, pulse (!) 53, temperature 97.6 F (36.4 C), temperature source Oral, resp. rate 16, height 5' 9.5" (1.765 m), weight 79.8 kg, SpO2 96 %.  Physical Exam Constitutional: No distress . Vital signs reviewed. HEENT: EOMI, oral membranes moist Neck: supple Cardiovascular: RRR without murmur. No JVD    Respiratory: CTA Bilaterally without wheezes or rales. Normal effort    GI: BS +, non-tender, non-distended  Musc: No edema or tenderness in extremities. Neurological:Alert, improved awareness Dysarthria Motor: LUE: 1+/5 proximal to distal--- stable LLE: 2+/5 HF, KE and 3/5 ADF/PF.  RUE/RLE grossly 4+/5 proximal to distal, stable  MAS 1 in Left finger flexors--no change Skin: Skin iswarm. He isnot diaphoretic.  Psychiatric: He has anormal mood and affect. Hisbehavior is normal.   Assessment/Plan: 1. Functional deficits secondary to RIght BG ICH which require 3+ hours per day of interdisciplinary therapy in a comprehensive inpatient rehab setting.  Physiatrist is providing close team supervision and 24 hour management of active medical problems listed below.  Physiatrist and rehab team continue to assess barriers to discharge/monitor patient progress toward functional and medical goals  Care Tool:  Bathing    Body parts bathed by patient: Chest, Left arm, Abdomen, Front perineal area, Right upper leg, Left upper leg, Face, Buttocks, Right lower leg, Left lower leg   Body parts bathed by helper: Right arm     Bathing assist Assist Level: Minimal Assistance - Patient > 75%     Upper Body Dressing/Undressing Upper body dressing   What is the patient wearing?: Pull over shirt     Upper body assist Assist Level: Minimal Assistance - Patient > 75%    Lower Body Dressing/Undressing Lower body dressing      What is the patient wearing?: Underwear/pull up, Pants     Lower body assist Assist for lower body dressing: Minimal Assistance - Patient > 75%     Toileting Toileting    Toileting assist Assist for toileting: Moderate Assistance - Patient 50 - 74%     Transfers Chair/bed transfer  Transfers assist  Chair/bed transfer activity did not occur: Safety/medical concerns  Chair/bed transfer assist level: Minimal Assistance - Patient > 75%     Locomotion Ambulation   Ambulation assist      Assist level: Minimal Assistance - Patient > 75% Assistive device: Walker-rolling Max distance: 70 ft   Walk 10 feet activity   Assist     Assist level: Minimal Assistance - Patient > 75% Assistive device: Walker-rolling   Walk 50 feet activity   Assist Walk 50 feet with 2 turns activity did not occur: Safety/medical concerns  Assist level: Minimal Assistance - Patient > 75% Assistive device: Walker-rolling    Walk 150 feet activity   Assist Walk 150 feet activity did not occur: Safety/medical concerns  Assist level: 2 helpers Assistive device: Lite Gait    Walk 10 feet on uneven surface  activity   Assist Walk 10 feet on uneven surfaces activity did not occur: Safety/medical concerns         Wheelchair     Assist Will patient use wheelchair at discharge?: No Type of Wheelchair: Manual    Wheelchair assist level: Supervision/Verbal cueing Max wheelchair distance: 100'    Wheelchair 50 feet with 2 turns activity    Assist        Assist Level: Supervision/Verbal cueing   Wheelchair 150 feet activity     Assist     Assist Level: Minimal Assistance - Patient >  75%    Medical Problem List and Plan: 1.  Left side weakness with dysarthria and dysphagia secondary to acute right basal ganglia hemorrhage  secondary to hypertensive crisis   Cont CIR PT, OT, SLP Team conference today please see physician documentation under team conference tab, met with team face-to-face to discuss problems,progress, and goals. Formulized individual treatment plan based on medical history, underlying problem and comorbidities.2. DVT Prophylaxis/ No Anticoagulation: SCDs 3. Pain Management:  Tylenol as needed, order resting hand splint 4. Mood:  Provide emotional support 5. Neuropsych: This patient is ?  Fully capable of making decisions on his own behalf. 6. Skin/Wound Care:  Routine skin checks  -Continue Weight shifting, OOB, appropriate nutrition 7. Fluids/Electrolytes/Nutrition:  Routine in and out's , I 410m                                     8. Dysphagia. Dysphagia #3 neck thick liquids. Follow-up speech therapy  Advance diet as tolerated 9. Hypertension. Lisinopril 2.5 mg (5 mg PTA) daily. Monitor with increased mobility Vitals:   04/21/18 1951 04/22/18 0536  BP: 133/66 (!) 146/63  Pulse: (!) 58 (!) 53  Resp: 16 16  Temp: 97.7 F (36.5 C) 97.6 F (36.4 C)  SpO2: 95% 96%   Controlled 1/22 10. History of gout. Allopurinol 300 mg daily. 11. History of CAD with CABG. No chest pain or shortness of breath 12. CKD stage III. Baseline creatinine 1.40  Creatinine 1.29 on 1/8  Continue to monitor 13.  Cough- likely multifactorial - no fever , lung exam normal, hx GERD, cont protonix, SLP to work on RMT 14.  Hypoalbuminemia  Supplement initiated on 1/11  16.  Urinary incont- offer urinal or toilet every 3hr while awake    LOS: 15 days A FACE TO [facility]  04/22/2018, 8:44 AM

## 2018-04-22 NOTE — Patient Care Conference (Signed)
Inpatient RehabilitationTeam Conference and Plan of Care Update Date: 04/22/2018   Time: 11:20 AM    Patient Name: Jimmy [name]      Medical Record Number: [id number]  Date of Birth: [birth year] Sex: Male         Room/Bed: 4W18C/4W18C-01 Payor Info: Payor: MEDICARE / Plan: MEDICARE PART A AND B / Product Type: *No Product type* /    Admitting Diagnosis: r bg hemorrhage  Admit Date/Time:  04/07/2018  4:24 PM Admission Comments: No comment available   Primary Diagnosis:  <principal problem not specified> Principal Problem: <principal problem not specified>  Patient Active Problem List   Diagnosis Date Noted  . Labile blood pressure   . Hypoalbuminemia due to protein-calorie malnutrition ([place])   . Hypertension with goal blood pressure less than 120/80   . CKD (chronic kidney disease), stage III ([place])   . Dysphagia, post-stroke   . Dysphagia 04/07/2018  . Family history of stroke 04/07/2018  . IVH (intraventricular hemorrhage) ([place]) 04/07/2018  . ICH (intracerebral hemorrhage) ([place]) 04/04/2018  . MCI (mild cognitive impairment) with memory loss 06/17/2017  . Dizziness 06/17/2017  . Diarrhea 10/18/2016  . TIA (transient ischemic attack) 04/08/2016  . Thrombocytopenia ([place]) 04/08/2016  . Leg weakness 11/11/2014  . Advanced care planning/counseling discussion 09/16/2014  . Nocturia 09/16/2014  . Mass of submandibular region   . History of CVA (cerebrovascular accident)   . Chest pressure 11/19/2013  . S/P CABG (coronary artery bypass graft) 11/19/2013  . Chronic kidney disease, stage 3 ([place])   . Aortic stenosis   . Medicare annual wellness visit, subsequent 08/27/2012  . Vitamin D deficiency 08/17/2012  . Dyspnea 06/26/2011  . Erectile dysfunction 09/18/2010  . Carotid artery stenosis 12/01/2009  . UNSPECIFIED SUBJECTIVE VISUAL DISTURBANCE 05/01/2009  . BACK PAIN, LUMBAR 10/24/2008  . Chronic lymphocytic leukemia, Rai stage 0 ([place]) 04/02/2007    Class: Chronic  .  DERMATITIS, SEBORRHEIC NOS 12/16/2006  . GENITAL HERPES 12/04/2006  . Mixed hyperlipidemia 12/04/2006  . Essential hypertension 12/04/2006  . Coronary atherosclerosis 12/04/2006  . GERD 12/04/2006  . Prediabetes 12/04/2006  . RENAL CALCULUS, HX OF 12/04/2006    Expected Discharge Date: Expected Discharge Date: 04/29/18  Team Members Present: Physician leading conference: Dr. Alysia Penna Social Worker Present: Ovidio Kin, LCSW Nurse Present: Etheleen Nicks, RN PT Present: Burnard Bunting, PT OT Present: Willeen Cass, OT;Roanna Epley, COTA SLP Present: Weston Anna, SLP     Current Status/Progress Goal Weekly Team Focus  Medical   Swallow improving no aspiration of thin liquids, blood pressure controlled, bradycardia stable  Maintain medical stability reduce risk of pneumonia  Discharge planning   Bowel/Bladder   continent of bowel & bladder, LBM 04/21/18  remain continent  assist as needed   Swallow/Nutrition/ Hydration   Dys. 3 textures with thin liquids, Min A for use of strategies   Supervision  tolerance of current diet, increase use of strategies    ADL's   bathing-min A; UB dressing-min A; LB dressing-min A; functional transfers-min A; decreased safety awareness; impaired postural control; L inattention;   supervision overall; contact guard for shower transfer  LUE NMR, safety awareness, functional tranfsers, standing balance, sitting balance, education, BADL retraining   Mobility   min assist bed mobility and transfers, gait up to 70 ft with RW and min assist in controlled environement with max verbal cueing for attention to task, mod assist for gait when distracted.  supervision  functional mobility, attention to task, safety, L NMR  Communication   Mod I  Mod I  Goal Met    Safety/Cognition/ Behavioral Observations  Min A   Supervision-MIn A   basic problem solving, attention, recall and awareness    Pain   no c/o pain, has tylenol prn that has not been used  pain  scale 2/10  assess & treat as needed   Skin   MASD to the perineal area  no aquired skin break down while on IP rehab  assess q shift      *See Care Plan and progress notes for long and short-term goals.     Barriers to Discharge  Current Status/Progress Possible Resolutions Date Resolved   Physician    Medical stability     Progressing with swallow, intake is good  Caregiver training this week      Nursing                  PT                    OT                  SLP                SW                Discharge Planning/Teaching Needs:  Will need to begin hands on education with wife to make sure she is able to provide the care pt will require at discharge.       Team Discussion:  Making progress but still loses balance to his left without being able to self correct. Becomes very distracted and needs constant cues. Upgraded diet to thin liquids. Will need to begin hands on training with wife to see if can provide the care pt will need at discharge.  Revisions to Treatment Plan:  DC 1/29    Continued Need for Acute Rehabilitation Level of Care: The patient requires daily medical management by a physician with specialized training in physical medicine and rehabilitation for the following conditions: Daily direction of a multidisciplinary physical rehabilitation program to ensure safe treatment while eliciting the highest outcome that is of practical value to the patient.: Yes Daily medical management of patient stability for increased activity during participation in an intensive rehabilitation regime.: Yes Daily analysis of laboratory values and/or radiology reports with any subsequent need for medication adjustment of medical intervention for : Neurological problems   I attest that I was present, lead the team conference, and concur with the assessment and plan of the team.   Elease Hashimoto 04/22/2018, 1:21 PM

## 2018-04-22 NOTE — Progress Notes (Signed)
Physical Therapy Weekly Progress Note  Patient Details  Name: Jimmy [name] MRN: [id number] Date of Birth: Jan 13, 1929  Beginning of progress report period: April 15, 2018 End of progress report period: April 22, 2018  Today's Date: 04/22/2018 PT Individual Time: 1300-1410 PT Individual Time Calculation (min): 70 min   Patient has met 3 of 3 short term goals. Pt continues to make slow progress towards LTGs due to skilled multimodal cues needed to attend to task, for L attention, safety awareness, midline orientation, and postural control. Pt is performing OOB mobility w/ min assist consistently including household distance gait w/ RW and transfers.    Patient continues to demonstrate the following deficits muscle weakness, decreased cardiorespiratoy endurance, abnormal tone, decreased coordination and decreased motor planning, decreased midline orientation and decreased attention to left, decreased safety awareness and decreased sitting balance, decreased standing balance, decreased postural control, hemiplegia and decreased balance strategies and therefore will continue to benefit from skilled PT intervention to increase functional independence with mobility.  Patient progressing toward long term goals..  Plan of care revisions: dynamic standing balance, car transfer, and ambulation goals downgraded to CGA 2/2 ongoing skilled verbal and tactile cues pt needs for safe standing mobility; added w/c goal as anticipate pt will not be ambulating w/ wife at home upon discharge.  PT Short Term Goals Week 2:  PT Short Term Goal 1 (Week 2): Pt will perform bed<>chair transfers with CGA PT Short Term Goal 1 - Progress (Week 2): Met PT Short Term Goal 2 (Week 2): Pt will ambulate x 50 ft with LRAD and min assist PT Short Term Goal 2 - Progress (Week 2): Met PT Short Term Goal 3 (Week 2): Pt will demonstrate midline posture during dynamic sitting and standing activities 75% of the time PT Short Term  Goal 3 - Progress (Week 2): Met Week 3:  PT Short Term Goal 1 (Week 3): =LTGs due to ELOS  Skilled Therapeutic Interventions/Progress Updates:   Pt in recliner and agreeable to therapy, no c/o pain. Min assist stand pivot transfer to w/c and pt self-propelled w/c to therapy gym w/ supervision via R hemi technique and verbal cues for technique/safety and L obstacle avoidance. Session focused on caregiver education and discharge planning. Ambulated 35' x2 w/ min assist using RW, manual facilitation for R lateral weight shifting and verbal cues for RW management and LLE step length. Tried using L foot-up brace w/ no improvement in quality of gait. Pt's wife present and educated on recommendation that pt not ambulate at home w/ her 2/2 skilled cues pt requires for safe gait. Both pt and wife verbalized understanding and in agreement with this. Educated wife on pt's ability to self-propel w/c and transfer technique. Pt performed blocked practice of squat pivot/lateral scoot transfers on level surfaces w/ CGA from therapist and CGA provided by wife towards end of practice. Transfers performed in both directions and w/ occasional verbal cues for technique. Practiced car transfer x1 w/ min-mod assist at pt's car level height, wife did not perform hands-on for this transfer. Had extensive discussion regarding pt's CLOF and anticipated discharge disposition. Wife agrees that 24/7 supervision would be required 2/2 cognition and agrees to find someone to sit w/ pt for times she needs to leave the house for errands. Pt and wife to discuss if they want hospital bed or not for safety during night time, will get back to this therapist w/ decision. Discussed home access w/ ramp, however wife and pt states it would  need to be paved as it's an uneven surface at this time. Wife calling a contractor today to discuss this and aware that the other option would be stair negotiation on other side of house or ambulance transfer. Both pt  and wife appreciative of education, wife states feeling "a little better" about discharging to home. Returned to room and ended session in recliner and in care of wife, all needs in reach.   Therapy Documentation Precautions:  Precautions Precautions: Fall Precaution Comments: left hemiparesis, left inattention Restrictions Weight Bearing Restrictions: No  Therapy/Group: Individual Therapy  Maysie Parkhill K Tiffney Haughton 04/22/2018, 2:10 PM

## 2018-04-22 NOTE — Progress Notes (Signed)
Occupational Therapy Weekly Progress Note  Patient Details  Name: Jimmy [name] MRN: [id number] Date of Birth: 1928/12/08  Beginning of progress report period: April 15, 2018 End of progress report period: April 22, 2018  Patient has met 3 of 4 short term goals.  Pt is making steady progress with BADLs and functional transfers.  Pt continues to required mod/max verbal cues for postural control with sitting/standing during functional tasks.  Pt with significant L lean which he can correct when cued.  Pt requires CGA for standing balance when pulling pants over hips secondary to L lean.  Pt continues to require mod verbal cues for LUE positioning when not engaged in functional task.  Pt initiates use of LUE during BADLs but requires assistance to complete tasks with LUE.  Pt's wife has not been present for therapy sessions.   Patient continues to demonstrate the following deficits: muscle weakness, decreased cardiorespiratoy endurance, unbalanced muscle activation, motor apraxia, decreased coordination and decreased motor planning, decreased visual perceptual skills, decreased attention to left, decreased attention, decreased awareness, decreased problem solving, decreased safety awareness, decreased memory and delayed processing and decreased sitting balance, decreased standing balance, decreased postural control, hemiplegia and decreased balance strategies and therefore will continue to benefit from skilled OT intervention to enhance overall performance with BADL.  Patient progressing toward long term goals..  Continue plan of care.  OT Short Term Goals Week 2:  OT Short Term Goal 1 (Week 2): Pt will perform LB dressing tasks with min A OT Short Term Goal 2 (Week 2): Pt will don pull over shirt with supervision after setup OT Short Term Goal 3 (Week 2): Pt will perform toileting tasks with mod A OT Short Term Goal 4 (Week 2): Pt will maintain standing balance with supervision to pull pants  over hips OT Short Term Goal 4 - Progress (Week 2): Progressing toward goal Week 3:  OT Short Term Goal 1 (Week 3): STG=LTG secondary to ELOS   Therapy Documentation Precautions:  Precautions Precautions: Fall Precaution Comments: left hemiparesis, left inattention Restrictions Weight Bearing Restrictions: No   ADL: ADL Eating: Minimal assistance Grooming: Setup, Supervision/safety Where Assessed-Grooming: Sitting at sink Upper Body Bathing: Supervision, Minimal cueing Where Assessed-Upper Body Bathing: Shower Lower Body Bathing: Minimal assistance, Minimal cueing Where Assessed-Lower Body Bathing: Shower Upper Body Dressing: Minimal assistance, Moderate cueing Where Assessed-Upper Body Dressing: Sitting at sink Lower Body Dressing: Minimal assistance, Moderate cueing Where Assessed-Lower Body Dressing: Sitting at sink, Standing at sink Toileting: Minimal assistance Where Assessed-Toileting: Glass blower/designer: Psychiatric nurse Method: Arts development officer: Energy manager: Minimal assistance, Minimal cueing Social research officer, government Method: Radiographer, therapeutic: Grab bars, Transfer tub bench   Therapy/Group: Individual Therapy  [name] 04/22/2018, 9:22 AM

## 2018-04-22 NOTE — Progress Notes (Signed)
Speech Language Pathology Daily Session Note  Patient Details  Name: Jimmy [name] MRN: [id number] Date of Birth: [birth year]  Today's Date: 04/22/2018 SLP Individual Time: 0830-0910 SLP Individual Time Calculation (min): 40 min  Short Term Goals: Week 3: SLP Short Term Goal 1 (Week 3): Patient will consume current diet with minimal overt s/s of aspiration with supervision verbal cues for use of compensatory strategies.  SLP Short Term Goal 2 (Week 3): Patient will perform RMT exericses with Mod I with a self-percieved effort level of 7/10.  SLP Short Term Goal 3 (Week 3): Patient will demonstrate selective attention to functional tasks in a minimally distracting enviornment for 30 minutes with Min A verbal cues for redirection.  SLP Short Term Goal 4 (Week 3): Patient will demonstrate functional problem solving for basic and familiar tasks with Min A verbal cues.  SLP Short Term Goal 5 (Week 3): Patient will attend/scan to left field of enviornment during functional tasks with Supervision verbal cues.   Skilled Therapeutic Interventions: Skilled treatment session focused on dysphagia and cognitive goals. SLP facilitated session by providing Min A verbal cues for sustained attention to functional tasks throughout session. Patient performed RMT exercises with supervision verbal cues and SLP introduced pharyngeal contraction exercises. Patient performed exercises with Mod A verbal and visual cues. Patient consumed thin liquids via straw with minimal overt s/s of aspiration and Min A verbal cues for use of a head turn to the left. Recommend patient continue current diet. Patient left upright in wheelchair with alarm on and all needs within reach. Continue with current plan of care.       Pain No/Denies pain   Therapy/Group: Individual Therapy  Sanda Dejoy 04/22/2018, 12:38 PM

## 2018-04-22 NOTE — Progress Notes (Signed)
NT Bri notifies RN that pt's bp is low, pt had just returned from therapy. Pt has TEDs on,  Pt is asymptomatic and feels fine. Will continue to monitor.

## 2018-04-23 ENCOUNTER — Inpatient Hospital Stay (HOSPITAL_COMMUNITY): Payer: Medicare Other | Admitting: Physical Therapy

## 2018-04-23 ENCOUNTER — Inpatient Hospital Stay (HOSPITAL_COMMUNITY): Payer: Medicare Other

## 2018-04-23 ENCOUNTER — Inpatient Hospital Stay (HOSPITAL_COMMUNITY): Payer: Medicare Other | Admitting: Occupational Therapy

## 2018-04-23 ENCOUNTER — Inpatient Hospital Stay (HOSPITAL_COMMUNITY): Payer: Medicare Other | Admitting: Speech Pathology

## 2018-04-23 NOTE — Progress Notes (Signed)
Occupational Therapy Session Note  Patient Details  Name: Jimmy [name] MRN: [id number] Date of Birth: Aug 01, 1928  Today's Date: 04/23/2018 OT Individual Time: 8978-4784 OT Individual Time Calculation (min): 57 min    Short Term Goals: Week 1:  OT Short Term Goal 1 (Week 1): Pt will complete toilet transfer with min A. OT Short Term Goal 1 - Progress (Week 1): Met OT Short Term Goal 2 (Week 1): Pt will weight shift to his L side to cleanse self on toilet with CGA. OT Short Term Goal 2 - Progress (Week 1): Met OT Short Term Goal 3 (Week 1): Pt will bathe with min A. OT Short Term Goal 3 - Progress (Week 1): Met OT Short Term Goal 4 (Week 1): Pt will don shirt with min A. OT Short Term Goal 4 - Progress (Week 1): Met OT Short Term Goal 5 (Week 1): Pt will be able to use LUE as a stabilizing A with donning LB clothing. OT Short Term Goal 5 - Progress (Week 1): Met  Skilled Therapeutic Interventions/Progress Updates:    1;1. Pt received in bed finishing breakfast. Pt supine>sitting EOB using bed rail and OT educates on importance of beginning to practice without use of bed rails to prep for going home. Pt completes stand pivot transfer with no AD and CGA EOB>w/c with VC for hand  Placement. Pt completes shaving at sink with VC for midline orientation d/t slight L lean while focusing on shaving task. Ot instructs in use of LUE as gross stabilizer for toothbrush while applying toothpaste with RUE. Pt completes oral care with supervision seated at sink. Pt selects clothing from OT presented on L. Pt dons t shirt with S overall and mod VC for hemi techniques. Pt dons underwear and pants crossing LLE into figure 4 to thread and pt standing with CGA increasing to MIN A d/t fatigue when advancing pants past hips. OT installs shoe buttons and coban in second set of shoes so pt able to fasten. Pt transfers into recliner with min A and VC for foot placement prior to transfer. Exited session with pt seated  in recliner, call light in reach safety belt alarm on and all needs met  Therapy Documentation Precautions:  Precautions Precautions: Fall Precaution Comments: left hemiparesis, left inattention Restrictions Weight Bearing Restrictions: No General:   Vital Signs: Therapy Vitals Temp: 97.7 F (36.5 C) Temp Source: Oral Pulse Rate: (!) 53 Resp: 16 BP: (!) 163/65 Patient Position (if appropriate): Lying Oxygen Therapy SpO2: 95 % O2 Device: Room Air Pain:   ADL: ADL Eating: Minimal assistance Grooming: Setup, Supervision/safety Where Assessed-Grooming: Sitting at sink Upper Body Bathing: Minimal assistance, Minimal cueing Where Assessed-Upper Body Bathing: Shower Lower Body Bathing: Minimal assistance, Minimal cueing Where Assessed-Lower Body Bathing: Shower Upper Body Dressing: Minimal assistance, Moderate cueing Where Assessed-Upper Body Dressing: Sitting at sink Lower Body Dressing: Minimal assistance, Moderate cueing Where Assessed-Lower Body Dressing: Sitting at sink, Standing at sink Toileting: Minimal assistance Where Assessed-Toileting: Glass blower/designer: Psychiatric nurse Method: Arts development officer: Energy manager: Minimal assistance, Minimal cueing Social research officer, government Method: Radiographer, therapeutic: Grab bars, Nurse, learning disability    Praxis   Exercises:   Other Treatments:     Therapy/Group: Individual Therapy  Tonny Branch 04/23/2018, 8:14 AM

## 2018-04-23 NOTE — Progress Notes (Signed)
[place] PHYSICAL MEDICINE & REHABILITATION PROGRESS NOTE   Subjective/Complaints: Patient dressing with OT today.  Needs cueing for right hand placement when donning shirt.  Has difficulty with determining proper position of shirt.  Needs cues for attention to the left  ROS: Patient denies CP, SOB, N/V/D Objective:   No results found. No results for input(s): WBC, HGB, HCT, PLT in the last 72 hours. No results for input(s): NA, K, CL, CO2, GLUCOSE, BUN, CREATININE, CALCIUM in the last 72 hours.  Intake/Output Summary (Last 24 hours) at 04/23/2018 1541 Last data filed at 04/23/2018 0700 Gross per 24 hour  Intake 420 ml  Output 850 ml  Net -430 ml     Physical Exam: Vital Signs Blood pressure (!) 101/50, pulse 75, temperature 97.7 F (36.5 C), resp. rate 18, height 5' 9.5" (1.765 m), weight 79.8 kg, SpO2 98 %.  Physical Exam Constitutional: No distress . Vital signs reviewed. HEENT: EOMI, oral membranes moist Neck: supple Cardiovascular: RRR without murmur. No JVD    Respiratory: CTA Bilaterally without wheezes or rales. Normal effort    GI: BS +, non-tender, non-distended  Musc: No edema or tenderness in extremities. Neurological:Alert, improved awareness Dysarthria Motor: LUE: 1+/5 proximal to distal--- stable LLE: 2+/5 HF, KE and 3/5 ADF/PF.  RUE/RLE grossly 4+/5 proximal to distal, stable  MAS 1 in Left finger flexors--no change Skin: Skin iswarm. He isnot diaphoretic.  Psychiatric: He has anormal mood and affect. Hisbehavior is normal.   Assessment/Plan: 1. Functional deficits secondary to RIght BG ICH which require 3+ hours per day of interdisciplinary therapy in a comprehensive inpatient rehab setting.  Physiatrist is providing close team supervision and 24 hour management of active medical problems listed below.  Physiatrist and rehab team continue to assess barriers to discharge/monitor patient progress toward functional and medical goals  Care  Tool:  Bathing    Body parts bathed by patient: Chest, Left arm, Abdomen, Front perineal area, Right upper leg, Left upper leg, Face, Buttocks, Right lower leg, Left lower leg   Body parts bathed by helper: Right arm     Bathing assist Assist Level: Minimal Assistance - Patient > 75%     Upper Body Dressing/Undressing Upper body dressing   What is the patient wearing?: Pull over shirt    Upper body assist Assist Level: Minimal Assistance - Patient > 75%    Lower Body Dressing/Undressing Lower body dressing      What is the patient wearing?: Underwear/pull up, Pants     Lower body assist Assist for lower body dressing: Minimal Assistance - Patient > 75%     Toileting Toileting    Toileting assist Assist for toileting: Moderate Assistance - Patient 50 - 74%     Transfers Chair/bed transfer  Transfers assist  Chair/bed transfer activity did not occur: Safety/medical concerns  Chair/bed transfer assist level: Minimal Assistance - Patient > 75%     Locomotion Ambulation   Ambulation assist      Assist level: Minimal Assistance - Patient > 75% Assistive device: Walker-rolling Max distance: 50'   Walk 10 feet activity   Assist     Assist level: Minimal Assistance - Patient > 75% Assistive device: Walker-rolling   Walk 50 feet activity   Assist Walk 50 feet with 2 turns activity did not occur: Safety/medical concerns  Assist level: Minimal Assistance - Patient > 75% Assistive device: Walker-rolling    Walk 150 feet activity   Assist Walk 150 feet activity did not occur: Safety/medical  concerns  Assist level: 2 helpers Assistive device: Lite Gait    Walk 10 feet on uneven surface  activity   Assist Walk 10 feet on uneven surfaces activity did not occur: Safety/medical concerns         Wheelchair     Assist Will patient use wheelchair at discharge?: Yes Type of Wheelchair: Manual    Wheelchair assist level: Supervision/Verbal  cueing Max wheelchair distance: 150'    Wheelchair 50 feet with 2 turns activity    Assist        Assist Level: Supervision/Verbal cueing   Wheelchair 150 feet activity     Assist     Assist Level: Supervision/Verbal cueing    Medical Problem List and Plan: 1.  Left side weakness with dysarthria and dysphagia secondary to acute right basal ganglia hemorrhage secondary to hypertensive crisis -continue CIR PT OT speech 2. DVT Prophylaxis/ No Anticoagulation: SCDs 3. Pain Management:  Tylenol as needed, order resting hand splint 4. Mood:  Provide emotional support 5. Neuropsych: This patient is ?  Fully capable of making decisions on his own behalf. 6. Skin/Wound Care:  Routine skin checks  -Continue Weight shifting, OOB, appropriate nutrition 7. Fluids/Electrolytes/Nutrition:  Routine in and out's , I 469ml                                     8. Dysphagia. Dysphagia #3 neck thick liquids. Follow-up speech therapy  Advance diet as tolerated 9. Hypertension. Lisinopril 2.5 mg (5 mg PTA) daily. Monitor with increased mobility Vitals:   04/23/18 0446 04/23/18 1335  BP: (!) 163/65 (!) 101/50  Pulse: (!) 53 75  Resp: 16 18  Temp: 97.7 F (36.5 C) 97.7 F (36.5 C)  SpO2: 95% 98%  Some lability remains particularly in systolic blood pressure, no med changes at this point 10. History of gout. Allopurinol 300 mg daily. 11. History of CAD with CABG. No chest pain or shortness of breath 12. CKD stage III. Baseline creatinine 1.40  Creatinine 1.29 on 1/8  Continue to monitor 13.  Cough- likely multifactorial - no fever , lung exam normal, hx GERD, cont protonix, SLP to work on RMT 14.  Hypoalbuminemia  Supplement initiated on 1/11  16.  Urinary incont- offer urinal or toilet every 3hr while awake    LOS: 16 days A FACE TO FACE EVALUATION WAS PERFORMED  Charlett Blake 04/23/2018, 3:41 PM

## 2018-04-23 NOTE — Progress Notes (Signed)
Physical Therapy Session Note  Patient Details  Name: Jimmy [name] MRN: [id number] Date of Birth: 06/18/1928  Today's Date: 04/23/2018 PT Individual Time: 1710-1740 PT Individual Time Calculation (min): 30 min   Short Term Goals: Week 3:  PT Short Term Goal 1 (Week 3): =LTGs due to ELOS  Skilled Therapeutic Interventions/Progress Updates:   Pt received supine in bed and agreeable to PT. Supine>sit transfer with min assist and min cues for sequencing. Stand pivot transfer to [facility] with min assist from PT and min cues for LE and UE placement. Pt transported to [employer]. Standing balance while engaged in UE reaching task of dynavision. Program A, 3 rings. R UE x 1 ( 42point) L UE x 2 ( 13 and 17 points). Min assist to improve postural alignment and prevent L lateral LOB. WC mobility x 155f with supervision assist and moderate cues to attention to the L side and improved use of RLE to prevent veer to the L. Pt returned to room and performed stand pivot transfer to bed with min-mod assist. Sit>supine completed with  Min assist, and left supine in bed with call bell in reach and all needs met.        Therapy Documentation Precautions:  Precautions Precautions: Fall Precaution Comments: left hemiparesis, left inattention Restrictions Weight Bearing Restrictions: No   Pain: denies   Therapy/Group: Individual Therapy  ALorie Phenix1/23/2020, 5:55 PM

## 2018-04-23 NOTE — Progress Notes (Signed)
Physical Therapy Session Note  Patient Details  Name: Jimmy [name] MRN: [id number] Date of Birth: 13-Jul-1928  Today's Date: 04/23/2018 PT Individual Time: 1000-1057 PT Individual Time Calculation (min): 57 min   Short Term Goals: Week 3:  PT Short Term Goal 1 (Week 3): =LTGs due to ELOS  Skilled Therapeutic Interventions/Progress Updates:   Pt in recliner and agreeable to therapy, denies pain. Min assist transfer to w/c and pt self-propelled w/c to/from therapy gym via R hemi technique w/ supervision. Practiced stair negotiation x4 steps w/ rail in R. Pt w/ bilateral rails, but too far apart to hold at the same time. Min assist first bout, CGA 2nd bout w/ verbal and visual cues for technique. Worked on postural control in standing remainder of session w/ emphasis on maintaining forward weight shift, pt w/ tendency w/ lose balance posteriorly. Min assist for standing balance fading to intermittent CGA w/ repetition. Verbal cues for weight on toes and tactile cues for weight shifting at hips. Standing tasks included putting clothespins in basketball net and performing trunk rotation while reaching. Occasional cues needed for L knee extension w/ prolonged standing. Blocked practice of sit<>stands during standing rest breaks, emphasis on forward trunk lean and anterior weight shifting during transition, min assist fading to close supervision to stabilize in stance. Returned to room and ended session in w/c, all needs in reach.  Therapy Documentation Precautions:  Precautions Precautions: Fall Precaution Comments: left hemiparesis, left inattention Restrictions Weight Bearing Restrictions: No  Therapy/Group: Individual Therapy  Alima Naser Clent Demark 04/23/2018, 10:58 AM

## 2018-04-23 NOTE — Progress Notes (Signed)
Occupational Therapy Session Note  Patient Details  Name: Jimmy [name] MRN: [id number] Date of Birth: [birth year]  Today's Date: 04/23/2018 OT Individual Time: 4765-4650 OT Individual Time Calculation (min): 50 min    Short Term Goals: Week 1:  OT Short Term Goal 1 (Week 1): Pt will complete toilet transfer with min A. OT Short Term Goal 1 - Progress (Week 1): Met OT Short Term Goal 2 (Week 1): Pt will weight shift to his L side to cleanse self on toilet with CGA. OT Short Term Goal 2 - Progress (Week 1): Met OT Short Term Goal 3 (Week 1): Pt will bathe with min A. OT Short Term Goal 3 - Progress (Week 1): Met OT Short Term Goal 4 (Week 1): Pt will don shirt with min A. OT Short Term Goal 4 - Progress (Week 1): Met OT Short Term Goal 5 (Week 1): Pt will be able to use LUE as a stabilizing A with donning LB clothing. OT Short Term Goal 5 - Progress (Week 1): Met Week 2:  OT Short Term Goal 1 (Week 2): Pt will perform LB dressing tasks with min A OT Short Term Goal 2 (Week 2): Pt will don pull over shirt with supervision after setup OT Short Term Goal 3 (Week 2): Pt will perform toileting tasks with mod A OT Short Term Goal 4 (Week 2): Pt will maintain standing balance with supervision to pull pants over hips OT Short Term Goal 4 - Progress (Week 2): Progressing toward goal Week 3:  OT Short Term Goal 1 (Week 3): STG=LTG secondary to ELOS  Skilled Therapeutic Interventions/Progress Updates:    Pt seen this session to focus on LUE NMR.  Pt received in w/c and taken to gym. Pt transferred to mat and was able to maintain good postural control throughout the session.  Pt worked on sh flex/ext with push pull exercises holding onto a stool handle.  Grasp and release with dynamic reach of cones and bean bags with hand over hand A.  Pt with minimal finger extension at MP joint with trace at PiP joints.  To increase active movement, applied estim to L finger extensor muscles on small muscle  atrophy setting at intensity 35.  Pt had a good response and tolerated well. Integrated grasp and release of cones during and after estim.  Pt complete 2 more transfers (back to w/c and then to recliner) with cues to fully step L leg around vs leading with his hips.  Pt in recliner with belt alarm on and all needs met.  Therapy Documentation Precautions:  Precautions Precautions: Fall Precaution Comments: left hemiparesis, left inattention Restrictions Weight Bearing Restrictions: No    Vital Signs: Therapy Vitals Temp: 97.7 F (36.5 C) Temp Source: Oral Pulse Rate: (!) 53 Resp: 16 BP: (!) 163/65 Patient Position (if appropriate): Lying Oxygen Therapy SpO2: 95 % O2 Device: Room Air Pain:   ADL: ADL Eating: Minimal assistance Grooming: Setup, Supervision/safety Where Assessed-Grooming: Sitting at sink Upper Body Bathing: Minimal assistance, Minimal cueing Where Assessed-Upper Body Bathing: Shower Lower Body Bathing: Minimal assistance, Minimal cueing Where Assessed-Lower Body Bathing: Shower Upper Body Dressing: Minimal assistance, Moderate cueing Where Assessed-Upper Body Dressing: Sitting at sink Lower Body Dressing: Minimal assistance, Moderate cueing Where Assessed-Lower Body Dressing: Sitting at sink, Standing at sink Toileting: Minimal assistance Where Assessed-Toileting: Glass blower/designer: Psychiatric nurse Method: Arts development officer: Energy manager: Minimal assistance, Minimal cueing Social research officer, government Method: Stand pivot  Walk-In Shower Equipment: Grab bars, Radio broadcast assistant   Therapy/Group: Individual Therapy  White Salmon 04/23/2018, 8:32 AM

## 2018-04-24 ENCOUNTER — Inpatient Hospital Stay (HOSPITAL_COMMUNITY): Payer: Medicare Other

## 2018-04-24 ENCOUNTER — Inpatient Hospital Stay (HOSPITAL_COMMUNITY): Payer: Medicare Other | Admitting: Physical Therapy

## 2018-04-24 ENCOUNTER — Inpatient Hospital Stay (HOSPITAL_COMMUNITY): Payer: Medicare Other | Admitting: Speech Pathology

## 2018-04-24 NOTE — Progress Notes (Signed)
Speech Language Pathology Daily Session Note  Patient Details  Name: Jimmy [name] MRN: [id number] Date of Birth: 08/30/1928  Today's Date: 04/24/2018 SLP Individual Time: 9622-2979 SLP Individual Time Calculation (min): 34 min  Short Term Goals: Week 3: SLP Short Term Goal 1 (Week 3): Patient will consume current diet with minimal overt s/s of aspiration with supervision verbal cues for use of compensatory strategies.  SLP Short Term Goal 2 (Week 3): Patient will perform RMT exericses with Mod I with a self-percieved effort level of 7/10.  SLP Short Term Goal 3 (Week 3): Patient will demonstrate selective attention to functional tasks in a minimally distracting enviornment for 30 minutes with Min A verbal cues for redirection.  SLP Short Term Goal 4 (Week 3): Patient will demonstrate functional problem solving for basic and familiar tasks with Min A verbal cues.  SLP Short Term Goal 5 (Week 3): Patient will attend/scan to left field of enviornment during functional tasks with Supervision verbal cues.   Skilled Therapeutic Interventions:  Pt was seen for skilled ST targeting dysphagia goals.  Pt needed supervision verbal cues for timing of head turn to the left with onset of swallow which at times resulted in immediate coughing on thin liquids.  No overt s/s of aspiration were evident with thin liquids when he could appropriately coordinate posture with swallow.  Pt completed 5 repetitions of the Masako exercise x2 and 10 repetitions of the Showa maneuver x2 with min assist verbal cues.  He also needed min assist verbal cues for return demonstration of both IMST and EMST devices.  Pt was transferred back to bed per his request at the end of today's therapy session.  Continue per current plan of care.    Pain Pain Assessment Pain Scale: 0-10 Pain Score: 0-No pain  Therapy/Group: Individual Therapy  Thalia Turkington, Selinda Orion 04/24/2018, 4:56 PM

## 2018-04-24 NOTE — Progress Notes (Signed)
Occupational Therapy Session Note  Patient Details  Name: Jimmy [name] MRN: [id number] Date of Birth: Mar 27, 1929  Today's Date: 04/24/2018 OT Individual Time: 0700-0810 OT Individual Time Calculation (min): 70 min    Short Term Goals: Week 3:  OT Short Term Goal 1 (Week 3): STG=LTG secondary to ELOS  Skilled Therapeutic Interventions/Progress Updates:    OT intervention with focus on BADL retraining (see below), bed mobility, sit<>stand, standing balance, sitting balance, attention to L, safety awareness, and activity tolerance to increase independence with BADLs. Pt continues to require min A and max verbal cues for sequencing for bed mobility.  Pt performs functional transfers with min A and mod verbal cues for sequencing and safety.  Pt continues to exhibit significant L lean when engaged in bathing/dressing tasks and requires max multimodal cues for sitting and standing balance. Pt initiates use of LUE in functional tasks but requires assistance to complete task. Pt remained seated in w/c with all needs within reach and belt alarm activated.   Therapy Documentation Precautions:  Precautions Precautions: Fall Precaution Comments: left hemiparesis, left inattention Restrictions Weight Bearing Restrictions: No Pain:  Pt denies pain ADL: ADL Eating: Minimal assistance Grooming: Setup, Supervision/safety Where Assessed-Grooming: Sitting at sink Upper Body Bathing: Minimal assistance, Minimal cueing Where Assessed-Upper Body Bathing: Shower Lower Body Bathing: Minimal assistance, Minimal cueing Where Assessed-Lower Body Bathing: Shower Upper Body Dressing: Supervision/safety Where Assessed-Upper Body Dressing: Sitting at sink Lower Body Dressing: Minimal assistance, Moderate cueing Where Assessed-Lower Body Dressing: Sitting at sink, Standing at sink Toileting: Minimal assistance Where Assessed-Toileting: Glass blower/designer: Psychiatric nurse Method:  Arts development officer: Energy manager: Minimal assistance, Minimal cueing Social research officer, government Method: Radiographer, therapeutic: Grab bars, Transfer tub bench   Therapy/Group: Individual Therapy  [name] 04/24/2018, 8:12 AM

## 2018-04-24 NOTE — NC FL2 (Signed)
[place] LEVEL OF CARE SCREENING TOOL     IDENTIFICATION  Patient Name: Jimmy [name] Birthdate: 11/26/1928 Sex: male Admission Date (Current Location): 04/07/2018  [facility] and [place] Number:  Engineering geologist and Address:  [facility] [place], [place] [address], [place], [place] [address]      Provider Number: [id number]  Attending Physician Name and Address:  Charlett Blake, MD  Relative Name and Phone Number:  Berna Bue [phone]    Current Level of Care: Other (Comment)(rehab) Recommended Level of Care: [place] Prior Approval Number:    Date Approved/Denied:   PASRR Number: [id number]  Discharge Plan: SNF    Current Diagnoses: Patient Active Problem List   Diagnosis Date Noted  . Labile blood pressure   . Hypoalbuminemia due to protein-calorie malnutrition ([place])   . Hypertension with goal blood pressure less than 120/80   . CKD (chronic kidney disease), stage III ([place])   . Dysphagia, post-stroke   . Dysphagia 04/07/2018  . Family history of stroke 04/07/2018  . IVH (intraventricular hemorrhage) ([place]) 04/07/2018  . ICH (intracerebral hemorrhage) ([place]) 04/04/2018  . MCI (mild cognitive impairment) with memory loss 06/17/2017  . Dizziness 06/17/2017  . Diarrhea 10/18/2016  . TIA (transient ischemic attack) 04/08/2016  . Thrombocytopenia ([place]) 04/08/2016  . Leg weakness 11/11/2014  . Advanced care planning/counseling discussion 09/16/2014  . Nocturia 09/16/2014  . Mass of submandibular region   . History of CVA (cerebrovascular accident)   . Chest pressure 11/19/2013  . S/P CABG (coronary artery bypass graft) 11/19/2013  . Chronic kidney disease, stage 3 ([place])   . Aortic stenosis   . Medicare annual wellness visit, subsequent 08/27/2012  . Vitamin D deficiency 08/17/2012  . Dyspnea 06/26/2011  . Erectile dysfunction 09/18/2010  . Carotid artery stenosis 12/01/2009  . UNSPECIFIED SUBJECTIVE VISUAL  DISTURBANCE 05/01/2009  . BACK PAIN, LUMBAR 10/24/2008  . Chronic lymphocytic leukemia, Rai stage 0 ([place]) 04/02/2007    Class: Chronic  . DERMATITIS, SEBORRHEIC NOS 12/16/2006  . GENITAL HERPES 12/04/2006  . Mixed hyperlipidemia 12/04/2006  . Essential hypertension 12/04/2006  . Coronary atherosclerosis 12/04/2006  . GERD 12/04/2006  . Prediabetes 12/04/2006  . RENAL CALCULUS, HX OF 12/04/2006    Orientation RESPIRATION BLADDER Height & Weight     Self, Time, Situation, Place  Normal Continent Weight: 175 lb 14.4 oz (79.8 kg) Height:  5' 9.5" (176.5 cm)  BEHAVIORAL SYMPTOMS/MOOD NEUROLOGICAL BOWEL NUTRITION STATUS      Continent Diet(regular thin)  AMBULATORY STATUS COMMUNICATION OF NEEDS Skin   Limited Assist Verbally Normal                       Personal Care Assistance Level of Assistance  Bathing, Dressing Bathing Assistance: Limited assistance   Dressing Assistance: Limited assistance     Functional Limitations Info  Sight(wears glasses) Sight Info: Impaired        SPECIAL CARE FACTORS FREQUENCY  PT (By licensed PT), OT (By licensed OT), Speech therapy     PT Frequency: 5x week OT Frequency: 5x week     Speech Therapy Frequency: 3x week      Contractures Contractures Info: Not present    Additional Factors Info  Code Status, Allergies Code Status Info: Full  Allergies Info: Tape           Current Medications (04/24/2018):  This is the current hospital active medication list Current Facility-Administered Medications  Medication Dose Route Frequency  Provider Last Rate Last Dose  . acetaminophen (TYLENOL) tablet 650 mg  650 mg Oral Q4H PRN Angiulli, Lavon Paganini, PA-C       Or  . acetaminophen (TYLENOL) solution 650 mg  650 mg Per Tube Q4H PRN Angiulli, Lavon Paganini, PA-C       Or  . acetaminophen (TYLENOL) suppository 650 mg  650 mg Rectal Q4H PRN Angiulli, Lavon Paganini, PA-C      . allopurinol (ZYLOPRIM) tablet 300 mg  300 mg Oral Daily Cathlyn Parsons, PA-C   300 mg at 04/24/18 1152  . lisinopril (PRINIVIL,ZESTRIL) tablet 5 mg  5 mg Oral Daily Kirsteins, Luanna Salk, MD   5 mg at 04/24/18 0913  . pantoprazole (PROTONIX) EC tablet 40 mg  40 mg Oral QHS Cathlyn Parsons, PA-C   40 mg at 04/23/18 2048  . RESOURCE THICKENUP CLEAR   Oral PRN Angiulli, Lavon Paganini, PA-C      . senna-docusate (Senokot-S) tablet 1 tablet  1 tablet Oral BID Cathlyn Parsons, PA-C   1 tablet at 04/24/18 0913  . sorbitol 70 % solution 30 mL  30 mL Oral Daily PRN Angiulli, Lavon Paganini, PA-C      . valACYclovir (VALTREX) tablet 500 mg  500 mg Oral Daily Cathlyn Parsons, PA-C   500 mg at 04/24/18 0913  . vitamin B-12 (CYANOCOBALAMIN) tablet 1,000 mcg  1,000 mcg Oral Daily Cathlyn Parsons, PA-C   1,000 mcg at 04/24/18 1102     Discharge Medications: Please see discharge summary for a list of discharge medications.  Relevant Imaging Results:  Relevant Lab Results:   Additional Information SSN: [id number]  Demoni Parmar, Gardiner Rhyme, LCSW

## 2018-04-24 NOTE — Progress Notes (Signed)
Physical Therapy Session Note  Patient Details  Name: Jimmy [name] MRN: [id number] Date of Birth: [birth year]  Today's Date: 04/24/2018 PT Individual Time: 1100-1155 AND 1330-1400 PT Individual Time Calculation (min): 55 min AND 30 min  Short Term Goals: Week 3:  PT Short Term Goal 1 (Week 3): =LTGs due to ELOS  Skilled Therapeutic Interventions/Progress Updates:   Session 1:  Pt in supine and agreeable to therapy, denies pain. Min assist transfer to EOB and to w/c via squat pivot. Pt self-propelled w/c to/from therapy gym w/ supervision via R hemi technique. Worked on standing postural control this session. Sit<>stands w/o UE support to stabilize in stance, CGA, much improved since yesterday's session. Performed clothespin reaching task w/ emphasis on postural control w/ trunk rotation and anterior weight shifting, CGA and tactile cues for L quad activation w/ prolonged standing. Transitioned to dual task in standing while playing [place] game, CGA to close supervision for this as well, much improved since last attempt at playing game. Returned to room via w/c and ended session in w/c, all needs met.   Session 2:  Pt in w/c and agreeable to therapy, denies pain. Self-propelled w/c to therapy gym w/ supervision via R hemi technique. Worked on gait training this session. Ambulated 75-100' x3 in minimally distracting environment and then distracting environment. Min assist overall for R lateral weight shifting and occasional RW management. Verbal cues to decrease gait speed w/ improved safety and increased L foot clearance. Returned to room and ended session in recliner, all needs in reach.   Therapy Documentation Precautions:  Precautions Precautions: Fall Precaution Comments: left hemiparesis, left inattention Restrictions Weight Bearing Restrictions: No Vital Signs: Therapy Vitals Pulse Rate: 77 BP: (!) 100/50  Therapy/Group: Individual Therapy  [name] K [name] 04/24/2018, 12:00 PM

## 2018-04-24 NOTE — Progress Notes (Signed)
Social Work Patient ID: DEWEL LOTTER, male   DOB: May 10, 1928, 85 y.o.   MRN: [id number] Met with pt and wife to discuss discharge plans and the care pt will require at home. This worker does not feel wife can manage pt's care needs at this time. Discussed hiring assist or going to a NH for a short time. Both feel going to a NH for a short time would be the best option. They have had a friend at [facility] and pref this facility. Will begin paperwork for this

## 2018-04-24 NOTE — Progress Notes (Signed)
[facility] PHYSICAL MEDICINE & REHABILITATION PROGRESS NOTE   Subjective/Complaints: Pt eating well, slept very well ROS: Patient denies CP, SOB, N/V/D Objective:   No results found. No results for input(s): WBC, HGB, HCT, PLT in the last 72 hours. No results for input(s): NA, K, CL, CO2, GLUCOSE, BUN, CREATININE, CALCIUM in the last 72 hours. No intake or output data in the 24 hours ending 04/24/18 0820   Physical Exam: Vital Signs Blood pressure (!) 141/62, pulse (!) 52, temperature 97.7 F (36.5 C), resp. rate 18, height 5' 9.5" (1.765 m), weight 79.8 kg, SpO2 94 %.  Physical Exam Constitutional: No distress . Vital signs reviewed. HEENT: EOMI, oral membranes moist Neck: supple Cardiovascular: RRR without murmur. No JVD    Respiratory: CTA Bilaterally without wheezes or rales. Normal effort    GI: BS +, non-tender, non-distended  Musc: No edema or tenderness in extremities. Neurological:Alert, improved awareness Dysarthria Motor: LUE:2-/5 proximal to distal--- mild improvement LLE: 2+/5 HF, KE and 3/5 ADF/PF.  RUE/RLE grossly 4+/5 proximal to distal, stable  MAS 1 in Left finger flexors--no change Skin: Skin iswarm. He isnot diaphoretic.  Psychiatric: He has anormal mood and affect. Hisbehavior is normal.   Assessment/Plan: 1. Functional deficits secondary to RIght BG ICH which require 3+ hours per day of interdisciplinary therapy in a comprehensive inpatient rehab setting.  Physiatrist is providing close team supervision and 24 hour management of active medical problems listed below.  Physiatrist and rehab team continue to assess barriers to discharge/monitor patient progress toward functional and medical goals  Care Tool:  Bathing    Body parts bathed by patient: Chest, Left arm, Abdomen, Front perineal area, Right upper leg, Left upper leg, Face, Buttocks, Right lower leg, Left lower leg   Body parts bathed by helper: Right arm     Bathing assist  Assist Level: Minimal Assistance - Patient > 75%     Upper Body Dressing/Undressing Upper body dressing   What is the patient wearing?: Pull over shirt    Upper body assist Assist Level: Supervision/Verbal cueing    Lower Body Dressing/Undressing Lower body dressing      What is the patient wearing?: Underwear/pull up, Pants     Lower body assist Assist for lower body dressing: Minimal Assistance - Patient > 75%     Toileting Toileting    Toileting assist Assist for toileting: Minimal Assistance - Patient > 75%     Transfers Chair/bed transfer  Transfers assist  Chair/bed transfer activity did not occur: Safety/medical concerns  Chair/bed transfer assist level: Minimal Assistance - Patient > 75%     Locomotion Ambulation   Ambulation assist      Assist level: Minimal Assistance - Patient > 75% Assistive device: Walker-rolling Max distance: 50'   Walk 10 feet activity   Assist     Assist level: Minimal Assistance - Patient > 75% Assistive device: Walker-rolling   Walk 50 feet activity   Assist Walk 50 feet with 2 turns activity did not occur: Safety/medical concerns  Assist level: Minimal Assistance - Patient > 75% Assistive device: Walker-rolling    Walk 150 feet activity   Assist Walk 150 feet activity did not occur: Safety/medical concerns  Assist level: 2 helpers Assistive device: Lite Gait    Walk 10 feet on uneven surface  activity   Assist Walk 10 feet on uneven surfaces activity did not occur: Safety/medical concerns         Wheelchair     Assist Will patient  use wheelchair at discharge?: Yes Type of Wheelchair: Manual    Wheelchair assist level: Supervision/Verbal cueing Max wheelchair distance: 150'    Wheelchair 50 feet with 2 turns activity    Assist        Assist Level: Supervision/Verbal cueing   Wheelchair 150 feet activity     Assist     Assist Level: Supervision/Verbal cueing     Medical Problem List and Plan: 1.  Left side weakness with dysarthria and dysphagia secondary to acute right basal ganglia hemorrhage secondary to hypertensive crisis -continue CIR PT OT speech 2. DVT Prophylaxis/ No Anticoagulation: SCDs 3. Pain Management:  Tylenol as needed, order resting hand splint 4. Mood:  Provide emotional support 5. Neuropsych: This patient is ?  Fully capable of making decisions on his own behalf. 6. Skin/Wound Care:  Routine skin checks  -Continue Weight shifting, OOB, appropriate nutrition 7. Fluids/Electrolytes/Nutrition:  Routine in and out's ,   1174ml                                8. Dysphagia. Dysphagia #3 nectar thick liquids. Follow-up speech therapy  Advance diet as tolerated 9. Hypertension. Lisinopril 2.5 mg (5 mg PTA) daily. Monitor with increased mobility Vitals:   04/23/18 1335 04/24/18 0549  BP: (!) 101/50 (!) 141/62  Pulse: 75 (!) 52  Resp: 18   Temp: 97.7 F (36.5 C)   SpO2: 98% 94%  Some lability remains particularly in systolic blood pressure, no med changes at this point 10. History of gout. Allopurinol 300 mg daily. 11. History of CAD with CABG. No chest pain or shortness of breath 12. CKD stage III. Baseline creatinine 1.40  Creatinine 1.29 on 1/8  Continue to monitor 13.  Cough- improved with RMT and with PPI 14.  Hypoalbuminemia  Supplement initiated on 1/11  16.  Urinary incont- offer urinal or toilet every 3hr while awake    LOS: 17 days A FACE TO [place] Samanatha Brammer 04/24/2018, 8:20 AM

## 2018-04-25 ENCOUNTER — Inpatient Hospital Stay (HOSPITAL_COMMUNITY): Payer: Medicare Other | Admitting: Physical Therapy

## 2018-04-25 NOTE — Progress Notes (Signed)
Jimmy [name] is a 85 y.o. male 06/03/1928 [id number]  Subjective: Patient reports he feels well today.  Enjoys his therapy sessions and is appreciative of attention from staff.  Excited to demonstrate improvement in coordination of left hand and strength of left leg.  Also looking forward to wheelchair dance party tomorrow (Sunday) at 11 AM as he reports these events are fun  Objective: Vital signs in last 24 hours: Temp:  [97.7 F (36.5 C)-98 F (36.7 C)] 98 F (36.7 C) (01/25 1304) Pulse Rate:  [58-90] 68 (01/25 1304) Resp:  [15-18] 16 (01/25 1304) BP: (100-138)/(52-60) 100/52 (01/25 1304) SpO2:  [92 %-99 %] 95 % (01/25 1304) Weight change:  Last BM Date: 04/24/18  Intake/Output from previous day: 01/24 0701 - 01/25 0700 In: 300 [P.O.:300] Out: -   Physical Exam General: NAD Lungs: CTA B CV: RRR Neuro: improving L HP  Lab Results: BMET    Component Value Date/Time   NA 137 04/08/2018 0536   NA 141 06/01/2013 0950   K 3.7 04/08/2018 0536   K 4.8 06/01/2013 0950   CL 105 04/08/2018 0536   CL 106 01/15/2012 1446   CO2 24 04/08/2018 0536   CO2 28 06/01/2013 0950   GLUCOSE 108 (H) 04/08/2018 0536   GLUCOSE 130 06/01/2013 0950   GLUCOSE 143 (H) 01/15/2012 1446   BUN 15 04/08/2018 0536   BUN 22.3 06/01/2013 0950   CREATININE 1.29 (H) 04/08/2018 0536   CREATININE 1.7 (H) 06/01/2013 0950   CALCIUM 9.2 04/08/2018 0536   CALCIUM 9.8 06/01/2013 0950   GFRNONAA 50 (L) 04/08/2018 0536   GFRAA 57 (L) 04/08/2018 0536   CBC    Component Value Date/Time   WBC 7.8 04/13/2018 1358   RBC 4.53 04/13/2018 1358   HGB 14.5 04/13/2018 1358   HGB 11.2 (L) 08/17/2013 1201   HCT 43.4 04/13/2018 1358   HCT 35.0 (L) 08/17/2013 1201   PLT 163 04/13/2018 1358   PLT 76 (L) 08/17/2013 1201   MCV 95.8 04/13/2018 1358   MCV 104.5 (H) 08/17/2013 1201   MCH 32.0 04/13/2018 1358   MCHC 33.4 04/13/2018 1358   RDW 12.3 04/13/2018 1358   RDW 14.8 (H) 08/17/2013 1201   LYMPHSABS 1.1  04/13/2018 1358   LYMPHSABS 51.0 (H) 08/17/2013 1201   MONOABS 0.7 04/13/2018 1358   MONOABS 0.5 08/17/2013 1201   EOSABS 0.2 04/13/2018 1358   EOSABS 0.2 08/17/2013 1201   EOSABS 0.5 10/24/2009 0919   BASOSABS 0.0 04/13/2018 1358   BASOSABS 0.1 08/17/2013 1201   CBG's (last 3):  No results for input(s): GLUCAP in the last 72 hours. LFT's Lab Results  Component Value Date   ALT 17 04/08/2018   AST 19 04/08/2018   ALKPHOS 86 04/08/2018   BILITOT 1.2 04/08/2018    Studies/Results: No results found.  Medications:  I have reviewed the patient's current medications. Scheduled Medications: . allopurinol  300 mg Oral Daily  . lisinopril  5 mg Oral Daily  . pantoprazole  40 mg Oral QHS  . senna-docusate  1 tablet Oral BID  . valACYclovir  500 mg Oral Daily  . vitamin B-12  1,000 mcg Oral Daily   PRN Medications: acetaminophen **OR** acetaminophen (TYLENOL) oral liquid 160 mg/5 mL **OR** acetaminophen, RESOURCE THICKENUP CLEAR, sorbitol  Assessment/Plan: Active Problems:   IVH (intraventricular hemorrhage) (HCC)   Hypoalbuminemia due to protein-calorie malnutrition (HCC)   Hypertension with goal blood pressure less than 120/80   CKD (chronic kidney disease), stage  III ([place])   Dysphagia, post-stroke   Labile blood pressure  1.  Acute right basal ganglia hemorrhage secondary to hypertensive crisis with subsequent left-sided weakness and dysarthria plus dysphasia.  Continue medical management and rehabilitative care with ongoing PT, OT and ST as prescribed. 2.  Hypertension.  Continue medical management with titration of medications as ongoing 3.  Dysphasia.  Continue diet recommendations per speech therapy with reevaluation as scheduled. 4.  History of gout.  No active symptoms.  Continue prophylactic medication with allopurinol daily as prior to admission. 5.  Chronic kidney disease stage III.  At baseline. 6.  History of coronary disease status post CABG.   Asymptomatic.   Length of stay, days: 18  Shihab States A. Asa Lente, MD 04/25/2018, 1:18 PM

## 2018-04-25 NOTE — Progress Notes (Signed)
Physical Therapy Session Note  Patient Details  Name: COLSON BARCO MRN: [id number] Date of Birth: Jul 28, 1928  Today's Date: 04/25/2018 PT Individual Time: 1000-1100 PT Individual Time Calculation (min): 60 min   Short Term Goals: Week 1:  PT Short Term Goal 1 (Week 1): Pt will performed bed<>chair transfers with min assist PT Short Term Goal 2 (Week 1): Pt will performed bed mobility with CGA PT Short Term Goal 3 (Week 1): Pt will ambulate x 50 ft with LRAD and min assist  PT Short Term Goal 3 - Progress (Week 1): Progressing toward goal  Skilled Therapeutic Interventions/Progress Updates:  Pt was seen bedside in the am. Pt shaved and brushed teeth with set up and S. Pt propelled w/c to gym with S and verbal cues due to inattention. In gym treatment focused on NMR with blocked practice sit to stand transfers 3 sets x 5 reps each. While standing worked on weight shifting and midline position. Pt propelled w/c back to room at end of treatment with S and verbal cues. Pt ambulated about 10 feet with rolling walker and min A with verbal cues. Pt left sitting up in recliner with seat belt alarm on and call bell within reach.   Therapy Documentation Precautions:  Precautions Precautions: Fall Precaution Comments: left hemiparesis, left inattention Restrictions Weight Bearing Restrictions: No General:   Pain: No c/o pain.   Therapy/Group: Individual Therapy  Dub Amis 04/25/2018, 12:21 PM

## 2018-04-26 ENCOUNTER — Inpatient Hospital Stay (HOSPITAL_COMMUNITY): Payer: Medicare Other | Admitting: Occupational Therapy

## 2018-04-26 NOTE — Progress Notes (Signed)
Jimmy [name] is a 85 y.o. male 1928/09/06 [id number]  Subjective: Patient reports he feels well today.  looking forward to wheelchair dance party today (Sunday) at 11 AM as he reports these events are fun - wife will also be coming in to participate in the party  Objective: Vital signs in last 24 hours: Temp:  [97.5 F (36.4 C)-98 F (36.7 C)] 97.5 F (36.4 C) (01/26 0617) Pulse Rate:  [62-68] 62 (01/26 0617) Resp:  [16] 16 (01/26 0617) BP: (100-135)/(47-63) 129/47 (01/26 0617) SpO2:  [95 %-98 %] 96 % (01/26 0617) Weight change:  Last BM Date: 04/25/18  Intake/Output from previous day: 01/25 0701 - 01/26 0700 In: 698 [P.O.:698] Out: -   Physical Exam General: NAD Lungs: CTA B CV: RRR Neuro: improving L HP  Lab Results: BMET    Component Value Date/Time   NA 137 04/08/2018 0536   NA 141 06/01/2013 0950   K 3.7 04/08/2018 0536   K 4.8 06/01/2013 0950   CL 105 04/08/2018 0536   CL 106 01/15/2012 1446   CO2 24 04/08/2018 0536   CO2 28 06/01/2013 0950   GLUCOSE 108 (H) 04/08/2018 0536   GLUCOSE 130 06/01/2013 0950   GLUCOSE 143 (H) 01/15/2012 1446   BUN 15 04/08/2018 0536   BUN 22.3 06/01/2013 0950   CREATININE 1.29 (H) 04/08/2018 0536   CREATININE 1.7 (H) 06/01/2013 0950   CALCIUM 9.2 04/08/2018 0536   CALCIUM 9.8 06/01/2013 0950   GFRNONAA 50 (L) 04/08/2018 0536   GFRAA 57 (L) 04/08/2018 0536   CBC    Component Value Date/Time   WBC 7.8 04/13/2018 1358   RBC 4.53 04/13/2018 1358   HGB 14.5 04/13/2018 1358   HGB 11.2 (L) 08/17/2013 1201   HCT 43.4 04/13/2018 1358   HCT 35.0 (L) 08/17/2013 1201   PLT 163 04/13/2018 1358   PLT 76 (L) 08/17/2013 1201   MCV 95.8 04/13/2018 1358   MCV 104.5 (H) 08/17/2013 1201   MCH 32.0 04/13/2018 1358   MCHC 33.4 04/13/2018 1358   RDW 12.3 04/13/2018 1358   RDW 14.8 (H) 08/17/2013 1201   LYMPHSABS 1.1 04/13/2018 1358   LYMPHSABS 51.0 (H) 08/17/2013 1201   MONOABS 0.7 04/13/2018 1358   MONOABS 0.5 08/17/2013 1201    EOSABS 0.2 04/13/2018 1358   EOSABS 0.2 08/17/2013 1201   EOSABS 0.5 10/24/2009 0919   BASOSABS 0.0 04/13/2018 1358   BASOSABS 0.1 08/17/2013 1201   CBG's (last 3):  No results for input(s): GLUCAP in the last 72 hours. LFT's Lab Results  Component Value Date   ALT 17 04/08/2018   AST 19 04/08/2018   ALKPHOS 86 04/08/2018   BILITOT 1.2 04/08/2018    Studies/Results: No results found.  Medications:  I have reviewed the patient's current medications. Scheduled Medications: . allopurinol  300 mg Oral Daily  . lisinopril  5 mg Oral Daily  . pantoprazole  40 mg Oral QHS  . senna-docusate  1 tablet Oral BID  . valACYclovir  500 mg Oral Daily  . vitamin B-12  1,000 mcg Oral Daily   PRN Medications: acetaminophen **OR** acetaminophen (TYLENOL) oral liquid 160 mg/5 mL **OR** acetaminophen, RESOURCE THICKENUP CLEAR, sorbitol  Assessment/Plan: Active Problems:   IVH (intraventricular hemorrhage) (HCC)   Hypoalbuminemia due to protein-calorie malnutrition (HCC)   Hypertension with goal blood pressure less than 120/80   CKD (chronic kidney disease), stage III (HCC)   Dysphagia, post-stroke   Labile blood pressure  1.  Acute right  basal ganglia hemorrhage secondary to hypertensive crisis with subsequent left-sided weakness and dysarthria plus dysphasia.  Continue medical management and rehabilitative care with ongoing PT, OT and ST as prescribed. 2.  Hypertension.  Continue medical management with titration of medications as ongoing 3.  Dysphasia.  Continue diet recommendations per speech therapy with reevaluation as scheduled. 4.  History of gout.  No active symptoms.  Continue prophylactic medication with allopurinol daily as prior to admission. 5.  Chronic kidney disease stage III.  At baseline. 6.  History of coronary disease status post CABG.  Asymptomatic.   Length of stay, days: 19  Trisa Cranor A. Asa Lente, MD 04/26/2018, 10:52 AM

## 2018-04-26 NOTE — Progress Notes (Signed)
Occupational Therapy Session Note  Patient Details  Name: CHRISTROPHER GINTZ MRN: [id number] Date of Birth: 26-Jan-1929  Today's Date: 04/26/2018 OT Individual Time: 1100-1157 OT Individual Time Calculation (min): 57 min    Short Term Goals: Week 3:  OT Short Term Goal 1 (Week 3): STG=LTG secondary to ELOS  Skilled Therapeutic Interventions/Progress Updates:    Pt seen for OT session focusing on functional standing balance/endurance, use and ROM of R UE and functional activity tolerance during music dance group- pt 1:1 during session.   Throughout session, he stood with min A to do dancing activities, able to weight shift R<>L with min A. Stood with wife to dance as well while therapist provided min A. Pt tolerating standing trials of ~1-2 minutes before requiring seated rest breaks Seated in w/c, able to hold therapist's hand and complete functional movements and ROM with min A. Pt smiling and laughing throughout session and socially engaged with other patients on unit.  Pt's wife assisted in returning him to room at w/c level.    Therapy Documentation Precautions:  Precautions Precautions: Fall Precaution Comments: left hemiparesis, left inattention Restrictions Weight Bearing Restrictions: No Pain: Pain Assessment Pain Scale: 0-10 Pain Score: 0-No pain   Therapy/Group: Individual Therapy  Ranika Mcniel L 04/26/2018, 6:46 AM

## 2018-04-27 ENCOUNTER — Inpatient Hospital Stay (HOSPITAL_COMMUNITY): Payer: Medicare Other

## 2018-04-27 ENCOUNTER — Inpatient Hospital Stay (HOSPITAL_COMMUNITY): Payer: Medicare Other | Admitting: Speech Pathology

## 2018-04-27 NOTE — Progress Notes (Signed)
Occupational Therapy Session Note  Patient Details  Name: Jimmy [name] MRN: [id number] Date of Birth: [birth year]  Today's Date: 04/27/2018 OT Individual Time: 7124-5809 OT Individual Time Calculation (min): 75 min    Short Term Goals: Week 3:  OT Short Term Goal 1 (Week 3): STG=LTG secondary to ELOS  Skilled Therapeutic Interventions/Progress Updates:    OT intervention with focus on functional transfers, sitting balance, sit<>stand, standing balance, BADL retraining, LUE NMR, and safety awareness to increase independence with BADLs.  Pt required only min verbal cues this morning to maintain sitting and standing balance with significantly less L lean noted.  Pt continues to initiate use of LUE in all functional tasks and is exhibited increased elbow flexion/extension and grasp and wrist extension.  Pt educated on activities/exercises to perform when resting in room.  See below for BADL assist and LUE NMR. Pt remained in w/c with all needs within reach and wife present.   Therapy Documentation Precautions:  Precautions Precautions: Fall Precaution Comments: left hemiparesis, left inattention Restrictions Weight Bearing Restrictions: No  Pain:  Pt denies pain ADL: ADL Eating: Minimal assistance Grooming: Setup, Supervision/safety Where Assessed-Grooming: Sitting at sink Upper Body Bathing: Minimal assistance, Minimal cueing Where Assessed-Upper Body Bathing: Shower Lower Body Bathing: Minimal assistance, Minimal cueing Where Assessed-Lower Body Bathing: Shower Upper Body Dressing: Supervision/safety Where Assessed-Upper Body Dressing: Sitting at sink Lower Body Dressing: Minimal assistance, Moderate cueing Where Assessed-Lower Body Dressing: Sitting at sink, Standing at sink Toileting: Minimal assistance Social research officer, government: Minimal assistance, Minimal cueing Social research officer, government Method: Stand pivot Youth worker: Grab bars, Transfer tub bench   Other  Treatments: Treatments Neuromuscular Facilitation: Forced use;Left;Activity to increase motor control;Activity to increase timing and sequencing;Activity to increase grading;Activity to increase sustained activation   Therapy/Group: Individual Therapy  [name] 04/27/2018, 12:17 PM

## 2018-04-27 NOTE — Progress Notes (Signed)
Physical Therapy Session Note  Patient Details  Name: Jimmy [name] MRN: [id number] Date of Birth: 1928-10-14  Today's Date: 04/27/2018 PT Individual Time: 1415-1515 PT Individual Time Calculation (min): 60 min   Short Term Goals: Week 1:  PT Short Term Goal 1 (Week 1): Pt will performed bed<>chair transfers with min assist PT Short Term Goal 2 (Week 1): Pt will performed bed mobility with CGA PT Short Term Goal 3 (Week 1): Pt will ambulate x 50 ft with LRAD and min assist  PT Short Term Goal 3 - Progress (Week 1): Progressing toward goal  Skilled Therapeutic Interventions/Progress Updates:    Pt supine in bed upon PT arrival, agreeable to therapy tx and reports L shoulder pain 2/10. Pt transferred to sitting EOB with supervision and increased time, use of bedrails. Pt donned shoes while sitting EOB, verbal cues for techniques. Pt performed stand pivot to w/c with min assist. Pt transported to the gym. Pt performed stand pivot to mat with min assist. Pt performd x 10 sit<>stands without UE support while working on postural control. Pt worked on postural control to performed stepping in place forward/back in place with each LE x 10 and to perform side stepping in place x 10 each side, both without UE support. Pt ambulated 2 x 80 ft this session with RW and min assist, verbal cues for techniques, foot placement and L foot clearance. Pt performed standing hamstring curls with L LE for neuro re-ed and tactile target to hit heel to mat 2 x 10. Pt worked on dynamic standing balance and weightshifting to perform sidestepping in front of the mat without UE support, min assist overall with occasional LOB to the left when side stepping to the left, 8 ft in each direction x 6. Pt performed stepping over object to the left with L foot working on postural control and balance, x 2 trials with min assist. Pt ambulated back to room x 150 ft with RW and min assist, toe cap for foot clearance and verbal cues for  techniques. Pt transferred to bed and left supine with needs in reach and bed alarm set.   Therapy Documentation Precautions:  Precautions Precautions: Fall Precaution Comments: left hemiparesis, left inattention Restrictions Weight Bearing Restrictions: No    Therapy/Group: Individual Therapy  Netta Corrigan, PT, DPT 04/27/2018, 7:43 AM

## 2018-04-27 NOTE — Progress Notes (Signed)
[place] PHYSICAL MEDICINE & REHABILITATION PROGRESS NOTE   Subjective/Complaints: Discussed with SLP, RMT insp and exp, needs full sup for head turn  ROS: Patient denies CP, SOB, N/V/D Objective:   No results found. No results for input(s): WBC, HGB, HCT, PLT in the last 72 hours. No results for input(s): NA, K, CL, CO2, GLUCOSE, BUN, CREATININE, CALCIUM in the last 72 hours.  Intake/Output Summary (Last 24 hours) at 04/27/2018 0818 Last data filed at 04/26/2018 1748 Gross per 24 hour  Intake 480 ml  Output -  Net 480 ml     Physical Exam: Vital Signs Blood pressure (!) 151/64, pulse (!) 55, temperature (!) 97.4 F (36.3 C), temperature source Oral, resp. rate 16, height 5' 9.5" (1.765 m), weight 79.8 kg, SpO2 94 %.  Physical Exam Constitutional: No distress . Vital signs reviewed. HEENT: EOMI, oral membranes moist Neck: supple Cardiovascular: RRR without murmur. No JVD    Respiratory: CTA Bilaterally without wheezes or rales. Normal effort    GI: BS +, non-tender, non-distended  Musc: No edema or tenderness in extremities. Neurological:Alert, improved awareness Dysarthria Motor: LUE:2-/5 proximal to distal--- mild improvement LLE: 2+/5 HF, KE and 3/5 ADF/PF.  RUE/RLE grossly 4+/5 proximal to distal, stable  MAS 1 in Left finger flexors--no change Skin: Skin iswarm. He isnot diaphoretic.  Psychiatric: He has anormal mood and affect. Hisbehavior is normal.   Assessment/Plan: 1. Functional deficits secondary to RIght BG ICH which require 3+ hours per day of interdisciplinary therapy in a comprehensive inpatient rehab setting.  Physiatrist is providing close team supervision and 24 hour management of active medical problems listed below.  Physiatrist and rehab team continue to assess barriers to discharge/monitor patient progress toward functional and medical goals  Care Tool:  Bathing    Body parts bathed by patient: Chest, Left arm, Abdomen, Front perineal  area, Right upper leg, Left upper leg, Face, Buttocks, Right lower leg, Left lower leg   Body parts bathed by helper: Right arm     Bathing assist Assist Level: Minimal Assistance - Patient > 75%     Upper Body Dressing/Undressing Upper body dressing   What is the patient wearing?: Pull over shirt    Upper body assist Assist Level: Supervision/Verbal cueing    Lower Body Dressing/Undressing Lower body dressing      What is the patient wearing?: Underwear/pull up, Pants     Lower body assist Assist for lower body dressing: Minimal Assistance - Patient > 75%     Toileting Toileting    Toileting assist Assist for toileting: Minimal Assistance - Patient > 75%     Transfers Chair/bed transfer  Transfers assist  Chair/bed transfer activity did not occur: Safety/medical concerns  Chair/bed transfer assist level: Minimal Assistance - Patient > 75%     Locomotion Ambulation   Ambulation assist      Assist level: Minimal Assistance - Patient > 75% Assistive device: Walker-rolling Max distance: 10   Walk 10 feet activity   Assist     Assist level: Minimal Assistance - Patient > 75% Assistive device: Walker-rolling   Walk 50 feet activity   Assist Walk 50 feet with 2 turns activity did not occur: Safety/medical concerns  Assist level: Minimal Assistance - Patient > 75% Assistive device: Walker-rolling    Walk 150 feet activity   Assist Walk 150 feet activity did not occur: Safety/medical concerns  Assist level: 2 helpers Assistive device: Lite Gait    Walk 10 feet on uneven surface  activity   Assist Walk 10 feet on uneven surfaces activity did not occur: Safety/medical concerns         Wheelchair     Assist Will patient use wheelchair at discharge?: Yes Type of Wheelchair: Manual    Wheelchair assist level: Supervision/Verbal cueing Max wheelchair distance: 150    Wheelchair 50 feet with 2 turns activity    Assist         Assist Level: Supervision/Verbal cueing   Wheelchair 150 feet activity     Assist     Assist Level: Supervision/Verbal cueing    Medical Problem List and Plan: 1.  Left side weakness with dysarthria and dysphagia secondary to acute right basal ganglia hemorrhage secondary to hypertensive crisis -continue CIR PT OT speech- new plan is for NHP 2. DVT Prophylaxis/ No Anticoagulation: SCDs 3. Pain Management:  Tylenol as needed, order resting hand splint 4. Mood:  Provide emotional support 5. Neuropsych: This patient is ?  Fully capable of making decisions on his own behalf. 6. Skin/Wound Care:  Routine skin checks  -Continue Weight shifting, OOB, appropriate nutrition 7. Fluids/Electrolytes/Nutrition:  Routine in and out's ,   468ml                                8. Dysphagia. Dysphagia #3 nectar thick liquids. Follow-up speech therapy  Advance diet as tolerated 9. Hypertension. Lisinopril 2.5 mg (5 mg PTA) daily. Monitor with increased mobility Vitals:   04/26/18 1944 04/27/18 0441  BP: (!) 152/64 (!) 151/64  Pulse: (!) 56 (!) 55  Resp: 16 16  Temp: 97.6 F (36.4 C) (!) 97.4 F (36.3 C)  SpO2: 97% 94%  Some lability remains particularly in systolic blood pressure, no med changes at this point 10. History of gout. Allopurinol 300 mg daily. 11. History of CAD with CABG. No chest pain or shortness of breath 12. CKD stage III. Baseline creatinine 1.40  Creatinine 1.29 on 1/8  Continue to monitor 13.  Cough- improved with RMT and with PPI 14.  Hypoalbuminemia  Supplement initiated on 1/11  16.  Urinary incont- offer urinal or toilet every 3hr while awake    LOS: 20 days A FACE TO [place] E Kirsteins 04/27/2018, 8:18 AM

## 2018-04-27 NOTE — Progress Notes (Signed)
Speech Language Pathology Daily Session Note  Patient Details  Name: PIERS BAADE MRN: [id number] Date of Birth: Aug 16, 1928  Today's Date: 04/27/2018 SLP Individual Time: 0730-0830 SLP Individual Time Calculation (min): 60 min  Short Term Goals: Week 3: SLP Short Term Goal 1 (Week 3): Patient will consume current diet with minimal overt s/s of aspiration with supervision verbal cues for use of compensatory strategies.  SLP Short Term Goal 2 (Week 3): Patient will perform RMT exericses with Mod I with a self-percieved effort level of 7/10.  SLP Short Term Goal 3 (Week 3): Patient will demonstrate selective attention to functional tasks in a minimally distracting enviornment for 30 minutes with Min A verbal cues for redirection.  SLP Short Term Goal 4 (Week 3): Patient will demonstrate functional problem solving for basic and familiar tasks with Min A verbal cues.  SLP Short Term Goal 5 (Week 3): Patient will attend/scan to left field of enviornment during functional tasks with Supervision verbal cues.   Skilled Therapeutic Interventions: Skilled treatment session focused on dysphagia and cognitive goals. SLP facilitated session by providing Min A verbal cues for use of a head turn to the left and for selective attention to self-feeding in a minimally distracting environment. Patient with intermittent overt s/s of aspiration X 2, suspect due to poor timing of the head turn. Patient was Mod I to complete 25 repetitions of EMST and IMST exercises accurately and required Min A verbal cues to perform pharyngeal contraction exercises accurately. Patient left upright in wheelchair with alarm on and all needs within reach. Continue with current plan of care.      Pain Pain Assessment Pain Scale: 0-10 Pain Score: 0-No pain  Therapy/Group: Individual Therapy  Kamilia Carollo 04/27/2018, 10:59 AM

## 2018-04-28 ENCOUNTER — Inpatient Hospital Stay (HOSPITAL_COMMUNITY): Payer: Self-pay

## 2018-04-28 ENCOUNTER — Inpatient Hospital Stay (HOSPITAL_COMMUNITY): Payer: Medicare Other | Admitting: Speech Pathology

## 2018-04-28 ENCOUNTER — Inpatient Hospital Stay (HOSPITAL_COMMUNITY): Payer: Medicare Other

## 2018-04-28 NOTE — Progress Notes (Signed)
Speech Language Pathology Daily Session Note  Patient Details  Name: BEAUREGARD JARRELLS MRN: [id number] Date of Birth: 12-29-1928  Today's Date: 04/28/2018 SLP Individual Time: 1100-1148 SLP Individual Time Calculation (min): 48 min  Short Term Goals: Week 3: SLP Short Term Goal 1 (Week 3): Patient will consume current diet with minimal overt s/s of aspiration with supervision verbal cues for use of compensatory strategies.  SLP Short Term Goal 2 (Week 3): Patient will perform RMT exericses with Mod I with a self-percieved effort level of 7/10.  SLP Short Term Goal 3 (Week 3): Patient will demonstrate selective attention to functional tasks in a minimally distracting enviornment for 30 minutes with Min A verbal cues for redirection.  SLP Short Term Goal 4 (Week 3): Patient will demonstrate functional problem solving for basic and familiar tasks with Min A verbal cues.  SLP Short Term Goal 5 (Week 3): Patient will attend/scan to left field of enviornment during functional tasks with Supervision verbal cues.   Skilled Therapeutic Interventions:    Skilled treatment session focused on cognition goals. SLP facilitated session by providing question cues for pt to demonstrate anticipatory awareness and recall of deficits related to CVA. Pt able to recall POC including possibility of placement at [facility] for additional rehab services. Pt able to recall information related to this stay including therapists' names and information related to compensatory swallow strategies. Pt able to demonstrate selective attention for ~ 40 minutes in mildly distracting environment with supervision cues. CSW made aware of pt's questions regarding pending discharge to [facility]. Pt left upright in bed, bed alarm on and all needs within reach.   Pain Pain Assessment Pain Scale: 0-10 Pain Score: 0-No pain  Therapy/Group: Individual Therapy  Emma-Lee Oddo 04/28/2018, 1:21 PM

## 2018-04-28 NOTE — Progress Notes (Signed)
PHYSICAL MEDICINE & REHABILITATION PROGRESS NOTE   Subjective/Complaints:  No issues overnite except some Left wrist pain . No injury or prior problems  ROS: Patient denies CP, SOB, N/V/D Objective:   No results found. No results for input(s): WBC, HGB, HCT, PLT in the last 72 hours. No results for input(s): NA, K, CL, CO2, GLUCOSE, BUN, CREATININE, CALCIUM in the last 72 hours. No intake or output data in the 24 hours ending 04/28/18 0802   Physical Exam: Vital Signs Blood pressure (!) 142/61, pulse (!) 51, temperature 97.7 F (36.5 C), temperature source Oral, resp. rate 15, height 5' 9.5" (1.765 m), weight 79.8 kg, SpO2 92 %.  Physical Exam Constitutional: No distress . Vital signs reviewed. HEENT: EOMI, oral membranes moist Neck: supple Cardiovascular: RRR without murmur. No JVD    Respiratory: CTA Bilaterally without wheezes or rales. Normal effort    GI: BS +, non-tender, non-distended  Musc: No edema or tenderness in extremities. Neurological:Alert, improved awareness Dysarthria Motor: LUE:2-/5 proximal to distal--- mild improvement LLE: 2+/5 HF, KE and 3/5 ADF/PF.  RUE/RLE grossly 4+/5 proximal to distal, stable  MAS 1 in Left finger flexors--no change Skin: Skin iswarm. He isnot diaphoretic.  Psychiatric: He has anormal mood and affect. Hisbehavior is normal.   Assessment/Plan: 1. Functional deficits secondary to RIght BG ICH which require 3+ hours per day of interdisciplinary therapy in [facility] comprehensive inpatient rehab setting.  Physiatrist is providing close team supervision and 24 hour management of active medical problems listed below.  Physiatrist and rehab team continue to assess barriers to discharge/monitor patient progress toward functional and medical goals  Care Tool:  Bathing    Body parts bathed by patient: Chest, Left arm, Abdomen, Front perineal area, Right upper leg, Left upper leg, Face, Buttocks, Right lower leg, Left lower  leg   Body parts bathed by helper: Right arm     Bathing assist Assist Level: Minimal Assistance - Patient > 75%     Upper Body Dressing/Undressing Upper body dressing   What is the patient wearing?: Pull over shirt    Upper body assist Assist Level: Supervision/Verbal cueing    Lower Body Dressing/Undressing Lower body dressing      What is the patient wearing?: Underwear/pull up, Pants     Lower body assist Assist for lower body dressing: Minimal Assistance - Patient > 75%     Toileting Toileting    Toileting assist Assist for toileting: Minimal Assistance - Patient > 75%     Transfers Chair/bed transfer  Transfers assist  Chair/bed transfer activity did not occur: Safety/medical concerns  Chair/bed transfer assist level: Minimal Assistance - Patient > 75%     Locomotion Ambulation   Ambulation assist      Assist level: Minimal Assistance - Patient > 75% Assistive device: Walker-rolling Max distance: 80 ft   Walk 10 feet activity   Assist     Assist level: Minimal Assistance - Patient > 75% Assistive device: Walker-rolling   Walk 50 feet activity   Assist Walk 50 feet with 2 turns activity did not occur: Safety/medical concerns  Assist level: Minimal Assistance - Patient > 75% Assistive device: Walker-rolling    Walk 150 feet activity   Assist Walk 150 feet activity did not occur: Safety/medical concerns  Assist level: 2 helpers Assistive device: Lite Gait    Walk 10 feet on uneven surface  activity   Assist Walk 10 feet on uneven surfaces activity did not occur: Safety/medical concerns  Wheelchair     Assist Will patient use wheelchair at discharge?: Yes Type of Wheelchair: Manual    Wheelchair assist level: Supervision/Verbal cueing Max wheelchair distance: 150    Wheelchair 50 feet with 2 turns activity    Assist        Assist Level: Supervision/Verbal cueing   Wheelchair 150 feet activity      Assist     Assist Level: Supervision/Verbal cueing    Medical Problem List and Plan: 1.  Left side weakness with dysarthria and dysphagia secondary to acute right basal ganglia hemorrhage secondary to hypertensive crisis -continue CIR PT OT speech- new plan is for NHP, team conf in am 2. DVT Prophylaxis/ No Anticoagulation: SCDs 3. Pain Management:  Tylenol as needed, order resting hand splint 4. Mood:  Provide emotional support 5. Neuropsych: This patient is ?  Fully capable of making decisions on his own behalf. 6. Skin/Wound Care:  Routine skin checks  -Continue Weight shifting, OOB, appropriate nutrition 7. Fluids/Electrolytes/Nutrition:  Routine in and out's ,   447ml  1/26 not recorded 1/27                              8. Dysphagia. Dysphagia #3 nectar thick liquids. Follow-up speech therapy  Advance diet as tolerated 9. Hypertension. Lisinopril 2.5 mg (5 mg PTA) daily. Monitor with increased mobility Vitals:   04/27/18 1956 04/28/18 0424  BP: (!) 116/58 (!) 142/61  Pulse: 68 (!) 51  Resp: 16 15  Temp: 97.6 F (36.4 C) 97.7 F (36.5 C)  SpO2: 96% 92%  Some lability remains particularly in systolic blood pressure, no med changes at this point 10. History of gout. Allopurinol 300 mg daily. 11. History of CAD with CABG. No chest pain or shortness of breath 12. CKD stage III. Baseline creatinine 1.40  Creatinine 1.29 on 1/8  Continue to monitor 13.  Cough- improved with RMT and with PPI 14.  Hypoalbuminemia  Supplement initiated on 1/11  16.  Urinary incont- offer urinal or toilet every 3hr while awake    LOS: 21 days Jimmy [name] Jimmy [name] 04/28/2018, 8:02 AM

## 2018-04-28 NOTE — Discharge Instructions (Signed)
Inpatient Rehab Discharge Instructions  [place] Discharge date and time: No discharge date for patient encounter.   Activities/Precautions/ Functional Status: Activity: activity as tolerated Diet: dysphagia #3 with thin liquids Wound Care: none needed Functional status:  ___ No restrictions     ___ Walk up steps independently ___ 24/7 supervision/assistance   ___ Walk up steps with assistance ___ Intermittent supervision/assistance  ___ Bathe/dress independently ___ Walk with walker     _x__ Bathe/dress with assistance ___ Walk Independently    ___ Shower independently ___ Walk with assistance    ___ Shower with assistance ___ No alcohol     ___ Return to work/school ________  Special Instructions: No aspirin or ibuprofen or Goody powders   My questions have been answered and I understand these instructions. I will adhere to these goals and the provided educational materials after my discharge from the hospital.  Patient/Caregiver Signature _______________________________ Date __________  Clinician Signature _______________________________________ Date __________  Please bring this form and your medication list with you to all your follow-up doctor's appointments.

## 2018-04-28 NOTE — Progress Notes (Signed)
Occupational Therapy Session Note  Patient Details  Name: Jimmy [name] MRN: [id number] Date of Birth: Sep 28, 1928  Today's Date: 04/28/2018 OT Individual Time: 1224-4975 OT Individual Time Calculation (min): 75 min    Short Term Goals: Week 3:  OT Short Term Goal 1 (Week 3): STG=LTG secondary to ELOS  Skilled Therapeutic Interventions/Progress Updates:    OT intervention with focus on BADL retraining, sitting balance, standing balance, functional transfers, safety awareness, forced use of LUE in bathing/dressing tasks, and activity tolerance to increase independence with BADLs. See below for assist levels in BADLs. Pt maintains sitting balance in shower with occasional verbal cue to correct L lean.  Pt maintains standing balance with CGA and min verbal cues for safety.  Pt continues to initiate use of LUE in all bathing/dressing tasks.  Pt requires more than a reasonable amount of time to complete all tasks.  Pt remained in w/c with all needs within reach and belt alarm activated.   Therapy Documentation Precautions:  Precautions Precautions: Fall Precaution Comments: left hemiparesis, left inattention Restrictions Weight Bearing Restrictions: No Pain:  Pt c/o discomfort in L fingers with PROM extension; educated pt on importance of self range of fingers ADL: ADL Eating: Minimal assistance Grooming: Setup, Supervision/safety Where Assessed-Grooming: Sitting at sink Upper Body Bathing: Minimal assistance, Minimal cueing Where Assessed-Upper Body Bathing: Shower Lower Body Bathing:Contact guard, Minimal cueing Where Assessed-Lower Body Bathing: Shower Upper Body Dressing: Supervision/safety Where Assessed-Upper Body Dressing: Sitting at sink Lower Body Dressing: Minimal assistance, Moderate cueing Where Assessed-Lower Body Dressing: Sitting at sink, Standing at sink Toileting: Minimal assistance Where Assessed-Toileting: Horticulturist, commercial Method: Arts development officer: Psychologist, counselling guard assistance, Minimal cueing Social research officer, government Method: Radiographer, therapeutic: Grab bars, Transfer tub bench   Therapy/Group: Individual Therapy  [name] 04/28/2018, 10:47 AM

## 2018-04-28 NOTE — Discharge Summary (Signed)
NAME: Jimmy [name], CANNATA MEDICAL RECORD [id number] ACCOUNT [id number] DATE OF BIRTH:[birth year] FACILITY: [facility] LOCATION: [id number] PHYSICIAN:ANDREW Letta Pate, MD  DISCHARGE SUMMARY  DATE OF DISCHARGE:  04/29/2018  ADMIT DATE:  04/07/2018  DISCHARGE DATE:  04/29/2018  DISCHARGE DIAGNOSES: 1.  Acute right basal ganglia hemorrhage secondary to hypertensive crisis. 2.  Pain management. 3.  Dysphagia. 4.  Hypertension. 5.  History of gout. 6.  History of coronary artery disease with a coronary artery bypass grafting. 7.  Chronic kidney disease, stage III.  HOSPITAL COURSE:  This is an 85 year old right-handed male with history of CAD with CABG, CLL, CKD stage III, diastolic congestive heart failure, hypertension, CVA 2 years ago.  Initially maintained on Aggrenox.  He lives with spouse.  Presented  04/04/2018 with sudden onset of left-sided weakness and dysarthria.  Blood pressure 152/75.  Cranial CT scan showed an 8.8 mL acute intraparenchymal hematoma centered at the posterior right lentiform nucleus without significant regional mass effect.  MRA  negative.  Initially placed on Cardene drip for blood pressure control.  Mechanical soft diet with nectar thick liquids.  Therapy evaluations completed.  The patient was admitted for comprehensive rehabilitation program.  PAST MEDICAL HISTORY:  See discharge diagnoses.  SOCIAL HISTORY:  Lives with spouse, reportedly independent prior to admission and active.  FUNCTIONAL STATUS:  Upon admission to rehab services was +2 safety for sit to stand, minimal assist side lying to sitting, ambulating 5 feet mod assist, max assist for ADLs.  PHYSICAL EXAMINATION: VITAL SIGNS:  Blood pressure 144/62, pulse 67, temperature 98, respirations 17. GENERAL:  Alert male in no acute distress.  Speech was dysarthric, but intelligible. HEENT:  EOMs intact. NECK:  Supple, nontender, no JVD. CARDIOVASCULAR:  Rate controlled. ABDOMEN:  Soft, nontender, good bowel  sounds. LUNGS:  Clear to auscultation without wheeze.  REHABILITATION HOSPITAL COURSE:  The patient was admitted to inpatient rehabilitation services.  Therapies initiated on a 3-hour daily basis, consisting of physical therapy, occupational therapy, speech therapy and rehabilitation nursing.  The following  issues were addressed:  Pertaining to the patient's acute right basal ganglia hemorrhage secondary to hypertensive crisis remained stable.  Close monitoring of blood pressure with lisinopril.  He would follow up neurology services.  SCDs for DVT  prophylaxis.  No bleeding episodes.  His diet was advanced to mechanical soft, thin liquids.  History of gout.  He remained on Zyloprim.  No gout flareups noted.  History of CAD with CABG.  No chest pain or shortness of breath.  CKD stage III, baseline creatinine 1.40 with latest creatinine  1.29.  The patient received weekly collaborative interdisciplinary team conferences to discuss estimated length of stay, family teaching, any barriers to discharge.  He was ambulating 80 feet x2 rolling walker, minimal assist.  Working with balance.   Performed forward, backward walking without assistive device.  Needed some verbal cues.  Transfers with supervision.  Gathered belongings for activities of daily living and homemaking.  Maintained sitting balance in the shower with occasional verbal cues  to correct a left lean.  It was felt by the family the patient needed skilled nursing facility placement. A bed becoming available 04/29/2018.    DISCHARGE MEDICATIONS:  Included Zyloprim 300 mg p.o. daily, lisinopril 5 mg p.o. daily, Protonix 40 mg p.o. at bedtime, Senokot-S 1 tablet p.o. b.i.d., Valtrex 500 mg p.o. daily, vitamin B12 at 1000 mcg p.o. daily, nitroglycerin sublingual as needed chest pain, vitamin D 2000 units daily, Zocor 10 mg daily at bedtime.  DIET:  Mechanical soft with thin liquids.  FOLLOWUP:  The patient would follow up with Dr. Alysia Penna  at the outpatient rehab service office as directed;  Dr. Antony Contras, neurology services, call for appointment; Dr. Danise Mina, medical management.  TN/NUANCE D:04/28/2018 T:04/28/2018 JOB:[id number]

## 2018-04-28 NOTE — Progress Notes (Signed)
Physical Therapy Session Note  Patient Details  Name: Jimmy [name] MRN: [id number] Date of Birth: [birth year]  Today's Date: 04/28/2018 PT Individual Time: 1301-1415 PT Individual Time Calculation (min): 74 min   Short Term Goals: Week 3:  PT Short Term Goal 1 (Week 3): =LTGs due to ELOS  Skilled Therapeutic Interventions/Progress Updates:   Pt seated in w/c upon PT arrival, agreeable to therapy tx and denies pain. Pt transported to the gym. Pt performed stand pivot to w/c with min assist. Pt performed x 10 sit<>stands this session without UE support and CGA working on balance and symmetric LE weightbearing. Pt worked on postural control in standing while therapist applied manual perturbations, x 2 trials. Pt ambulated 2 x 80 ft this session with RW and min assist, verbal cues for L heel strike and increased L step length, shoe cover over L shoe. Pt worked on sidestepping for neuro re-ed, dynamic balance and hip strengthening 4 x 35ft in each direction. Pt performed forwards/backwards walking without AD 2 x 10 ft in each direction, mod assist for balance, with verbal cues for sequencing. Pt ambulated x 80 ft without AD and min assist, verbal cues for attention to task and L foot clearance/step length. Pt transferred to tall kneeling with mod assist and worked on hip strengthening, NMR and postural control to perform reaching task to complete peg board puzzle, pts R knee became painful and needed +2 to assist back to sitting. Pt ambulated x 150 ft with RW and min assist, cues for L foot length and using shoe cover. Pt transported outside this session, performed transfer from w/c<>bench with min assist. Pt transported back to room and transferred to bed min assist, left supine with needs in reach and bed alarm set.   Therapy Documentation Precautions:  Precautions Precautions: Fall Precaution Comments: left hemiparesis, left inattention Restrictions Weight Bearing Restrictions:  No    Therapy/Group: Individual Therapy  Netta Corrigan, PT, DPT 04/28/2018, 7:48 AM

## 2018-04-28 NOTE — Discharge Summary (Signed)
Discharge summary job # [id number]

## 2018-04-28 NOTE — Progress Notes (Signed)
Social Work [name], male   DOB: 1928/12/28, 86 y.o.   MRN: [id number] Have contacted [facility] and am still awaiting return call regarding available bed for pt. Will continue to try to reach [facility].

## 2018-04-28 NOTE — Plan of Care (Signed)
Goals downgraded JLS

## 2018-04-29 ENCOUNTER — Inpatient Hospital Stay (HOSPITAL_COMMUNITY): Payer: Medicare Other | Admitting: Speech Pathology

## 2018-04-29 ENCOUNTER — Inpatient Hospital Stay (HOSPITAL_COMMUNITY): Payer: Medicare Other

## 2018-04-29 ENCOUNTER — Inpatient Hospital Stay (HOSPITAL_COMMUNITY): Payer: Self-pay

## 2018-04-29 DIAGNOSIS — I69115 Cognitive social or emotional deficit following nontraumatic intracerebral hemorrhage: Secondary | ICD-10-CM | POA: Diagnosis not present

## 2018-04-29 DIAGNOSIS — R1312 Dysphagia, oropharyngeal phase: Secondary | ICD-10-CM | POA: Diagnosis not present

## 2018-04-29 DIAGNOSIS — G8114 Spastic hemiplegia affecting left nondominant side: Secondary | ICD-10-CM | POA: Diagnosis not present

## 2018-04-29 DIAGNOSIS — E785 Hyperlipidemia, unspecified: Secondary | ICD-10-CM | POA: Diagnosis not present

## 2018-04-29 DIAGNOSIS — I25810 Atherosclerosis of coronary artery bypass graft(s) without angina pectoris: Secondary | ICD-10-CM | POA: Diagnosis not present

## 2018-04-29 DIAGNOSIS — I69191 Dysphagia following nontraumatic intracerebral hemorrhage: Secondary | ICD-10-CM | POA: Diagnosis not present

## 2018-04-29 DIAGNOSIS — I615 Nontraumatic intracerebral hemorrhage, intraventricular: Secondary | ICD-10-CM | POA: Diagnosis not present

## 2018-04-29 DIAGNOSIS — I5030 Unspecified diastolic (congestive) heart failure: Secondary | ICD-10-CM | POA: Diagnosis not present

## 2018-04-29 DIAGNOSIS — I69154 Hemiplegia and hemiparesis following nontraumatic intracerebral hemorrhage affecting left non-dominant side: Secondary | ICD-10-CM | POA: Diagnosis not present

## 2018-04-29 DIAGNOSIS — M2550 Pain in unspecified joint: Secondary | ICD-10-CM | POA: Diagnosis not present

## 2018-04-29 DIAGNOSIS — I2510 Atherosclerotic heart disease of native coronary artery without angina pectoris: Secondary | ICD-10-CM | POA: Diagnosis not present

## 2018-04-29 DIAGNOSIS — I959 Hypotension, unspecified: Secondary | ICD-10-CM | POA: Diagnosis not present

## 2018-04-29 DIAGNOSIS — K219 Gastro-esophageal reflux disease without esophagitis: Secondary | ICD-10-CM | POA: Diagnosis not present

## 2018-04-29 DIAGNOSIS — I69122 Dysarthria following nontraumatic intracerebral hemorrhage: Secondary | ICD-10-CM | POA: Diagnosis not present

## 2018-04-29 DIAGNOSIS — E538 Deficiency of other specified B group vitamins: Secondary | ICD-10-CM | POA: Diagnosis not present

## 2018-04-29 DIAGNOSIS — I69398 Other sequelae of cerebral infarction: Secondary | ICD-10-CM | POA: Diagnosis not present

## 2018-04-29 DIAGNOSIS — R1310 Dysphagia, unspecified: Secondary | ICD-10-CM | POA: Diagnosis not present

## 2018-04-29 DIAGNOSIS — Z7401 Bed confinement status: Secondary | ICD-10-CM | POA: Diagnosis not present

## 2018-04-29 DIAGNOSIS — Z951 Presence of aortocoronary bypass graft: Secondary | ICD-10-CM | POA: Diagnosis not present

## 2018-04-29 DIAGNOSIS — M109 Gout, unspecified: Secondary | ICD-10-CM | POA: Diagnosis not present

## 2018-04-29 DIAGNOSIS — I10 Essential (primary) hypertension: Secondary | ICD-10-CM | POA: Diagnosis not present

## 2018-04-29 DIAGNOSIS — D696 Thrombocytopenia, unspecified: Secondary | ICD-10-CM | POA: Diagnosis not present

## 2018-04-29 DIAGNOSIS — R5381 Other malaise: Secondary | ICD-10-CM | POA: Diagnosis not present

## 2018-04-29 DIAGNOSIS — M6281 Muscle weakness (generalized): Secondary | ICD-10-CM | POA: Diagnosis not present

## 2018-04-29 DIAGNOSIS — I6911 Attention and concentration deficit following nontraumatic intracerebral hemorrhage: Secondary | ICD-10-CM | POA: Diagnosis not present

## 2018-04-29 DIAGNOSIS — E559 Vitamin D deficiency, unspecified: Secondary | ICD-10-CM | POA: Diagnosis not present

## 2018-04-29 DIAGNOSIS — R58 Hemorrhage, not elsewhere classified: Secondary | ICD-10-CM | POA: Diagnosis not present

## 2018-04-29 DIAGNOSIS — I69391 Dysphagia following cerebral infarction: Secondary | ICD-10-CM | POA: Diagnosis not present

## 2018-04-29 DIAGNOSIS — B001 Herpesviral vesicular dermatitis: Secondary | ICD-10-CM | POA: Diagnosis not present

## 2018-04-29 DIAGNOSIS — C9110 Chronic lymphocytic leukemia of B-cell type not having achieved remission: Secondary | ICD-10-CM | POA: Diagnosis not present

## 2018-04-29 DIAGNOSIS — N183 Chronic kidney disease, stage 3 (moderate): Secondary | ICD-10-CM | POA: Diagnosis not present

## 2018-04-29 DIAGNOSIS — I130 Hypertensive heart and chronic kidney disease with heart failure and stage 1 through stage 4 chronic kidney disease, or unspecified chronic kidney disease: Secondary | ICD-10-CM | POA: Diagnosis not present

## 2018-04-29 DIAGNOSIS — I5032 Chronic diastolic (congestive) heart failure: Secondary | ICD-10-CM | POA: Diagnosis not present

## 2018-04-29 DIAGNOSIS — Z856 Personal history of leukemia: Secondary | ICD-10-CM | POA: Diagnosis not present

## 2018-04-29 MED ORDER — ACETAMINOPHEN 325 MG PO TABS: 650.0000 mg | ORAL_TABLET | ORAL | Status: AC | PRN

## 2018-04-29 MED ORDER — LISINOPRIL 5 MG PO TABS: 5.0000 mg | ORAL_TABLET | Freq: Every day | ORAL | Status: DC

## 2018-04-29 MED ORDER — VALACYCLOVIR HCL 500 MG PO TABS: 500.0000 mg | ORAL_TABLET | Freq: Every day | ORAL | Status: DC

## 2018-04-29 MED ORDER — PANTOPRAZOLE SODIUM 40 MG PO TBEC: 40.0000 mg | DELAYED_RELEASE_TABLET | Freq: Every day | ORAL | Status: DC

## 2018-04-29 NOTE — Progress Notes (Signed)
Speech Language Pathology Discharge Summary  Patient Details  Name: Jimmy [name] MRN: [id number] Date of Birth: 21-Nov-1928   Patient has met 8 of 8 long term goals.  Patient to discharge at overall Supervision;Min level.   Reasons goals not met: N/A   Clinical Impression/Discharge Summary: Patient has made excellent gains and has met 8 of 8 LTGs this admission. Currently, patient is consuming Dys. 3 textures with thin liquids via straw with minimal overt s/s of aspiration and supervision-Min A verbal cues needed for use of head turn to the left. Patient demonstrates increased speech intelligibility and is ~100% intelligible at the conversation level but continues to demonstrate a hoarse vocal quality. Patient has been utilizing RMT to maximize swallowing and speech function with overall supervision level cues for accuracy. Patient demonstrates improved cognitive functioning but requires overall Min A verbal cues for selective attention, functional problem solving, emergent awareness and recall of functional information. Patient and family education is complete. Patient's family is unable to provide the necessary assistance needed at this time, therefore, patient will discharge to a SNF. Patient would benefit from f/u SLP services in order to maximize his swallowing, speech and cognitive functioning in order to reduce caregiver burden.   Care Partner:  Caregiver Able to Provide Assistance: No  Type of Caregiver Assistance: Physical;Cognitive  Recommendation:  Skilled Nursing facility  Rationale for SLP Follow Up: Maximize swallowing safety;Maximize cognitive function and independence;Reduce caregiver burden   Equipment: EMST and IMST devices    Reasons for discharge: Treatment goals met;Discharged from hospital   Patient/Family Agrees with Progress Made and Goals Achieved: Yes    [place], [place] 04/29/2018, 9:39 AM

## 2018-04-29 NOTE — Progress Notes (Signed)
Social Work  Discharge Note  The overall goal for the admission was met for:   Discharge location: Yes-[facility]-SNF  Length of Stay: Yes-22 DAYS  Discharge activity level: Yes-MIN ASSIST LEVEL  Home/community participation: Yes  Services provided included: MD, RD, PT, OT, SLP, RN, CM, Pharmacy and SW  Financial Services: Medicare and Private Insurance: [employer]  Follow-up services arranged: Other: SHORT TERM NHP  Comments (or additional information):PT NEEDS MORE REHAB BEFORE GOING HOME WITH WIFE.   Patient/Family verbalized understanding of follow-up arrangements: Yes  Individual responsible for coordination of the follow-up plan: [name]-WIFE  Confirmed correct DME delivered: Elease Hashimoto 04/29/2018    Elease Hashimoto

## 2018-04-29 NOTE — Progress Notes (Signed)
Physical Therapy Discharge Summary  Patient Details  Name: Jimmy [name] MRN: [id number] Date of Birth: 1929/02/08    Patient has met 7 of 8 long term goals due to improved activity tolerance, improved balance, improved postural control, increased strength, improved attention and improved awareness.  Patient to discharge at an ambulatory level [place].   Patient's care partner unavailable to provide the necessary physical assistance at discharge. Therefore pt will continue therapy in skilled nursing facility.   Pt did not meet transfer goal as he requires assist to perform transfers.   Recommendation:  Patient will benefit from ongoing skilled PT services in skilled nursing facility setting to continue to advance safe functional mobility, address ongoing impairments in balance, gait, attention, and minimize fall risk.  Equipment: No equipment provided  Reasons for discharge: treatment goals met  Patient/family agrees with progress made and goals achieved: Yes  PT Discharge Precautions/Restrictions Precautions Precautions: Fall Precaution Comments: left hemiparesis, left inattention Restrictions Weight Bearing Restrictions: No Vital Signs Therapy Vitals Temp: 97.6 F (36.4 C) Temp Source: Oral Pulse Rate: 90 Resp: 18 BP: 106/75 Patient Position (if appropriate): Lying Oxygen Therapy SpO2: 97 % O2 Device: Room Air Pain   denies pain Vision/Perception  Perception Perception: Impaired Comments: mild L inattention but improved since eval Praxis Praxis: Intact  Cognition Overall Cognitive Status: Impaired/Different from baseline Arousal/Alertness: Awake/alert Orientation Level: Oriented X4 Attention: Sustained;Selective Sustained Attention: Appears intact Selective Attention: Impaired Selective Attention Impairment: Functional basic Memory: Impaired Awareness: Impaired Awareness Impairment: Emergent impairment Behaviors: Lability Safety/Judgment:  Impaired Sensation Sensation Light Touch: Impaired Detail Hot/Cold: Appears Intact Proprioception: Appears Intact Additional Comments: diminished sensation L side compared to R Coordination Gross Motor Movements are Fluid and Coordinated: No Fine Motor Movements are Fluid and Coordinated: No Coordination and Movement Description: coordination impaired secondary to L hemiparesis Motor  Motor Motor: Hemiplegia Motor - Skilled Clinical Observations: L hemiparesis  Mobility Bed Mobility Bed Mobility: Supine to Sit;Sit to Supine Supine to Sit: Supervision/Verbal cueing Sit to Supine: Supervision/Verbal cueing Transfers Transfers: Stand Pivot Transfers;Sit to Stand;Stand to Sit;Squat Pivot Transfers Sit to Stand: Supervision/Verbal cueing Stand to Sit: Supervision/Verbal cueing Stand Pivot Transfers: Contact Guard/Touching assist Stand Pivot Transfer Details: Verbal cues for technique;Verbal cues for precautions/safety;Verbal cues for safe use of DME/AE Squat Pivot Transfers: Supervision/Verbal cueing Locomotion  Gait Ambulation: Yes Gait Assistance: Contact Guard/Touching assist;Minimal Assistance - Patient > 75% Gait Distance (Feet): 150 Feet Assistive device: Rolling walker(toe cap) Gait Assistance Details: Verbal cues for technique;Verbal cues for precautions/safety;Visual cues for safe use of DME/AE;Verbal cues for sequencing  Trunk/Postural Assessment  Cervical Assessment Cervical Assessment: Within Functional Limits Thoracic Assessment Thoracic Assessment: Exceptions to WFL(rounded shoulders) Lumbar Assessment Lumbar Assessment: Exceptions to WFL(posterior pelvic tilt) Postural Control Postural Control: Deficits on evaluation  Balance Balance Balance Assessed: Yes Static Sitting Balance Static Sitting - Level of Assistance: 5: Stand by assistance Dynamic Sitting Balance Dynamic Sitting - Level of Assistance: 5: Stand by assistance Static Standing Balance Static  Standing - Level of Assistance: 5: Stand by assistance Dynamic Standing Balance Dynamic Standing - Level of Assistance: 4: Min assist Extremity Assessment  RUE Assessment RUE Assessment: Within Functional Limits LUE Assessment LUE Assessment: Exceptions to [facility] Passive Range of Motion (PROM) Comments: WFL Active Range of Motion (AROM) Comments: elbow flexion to 110, trace scapular elevation, minimal wrist flexion/extension and finger flexion/extension LUE Body System: Neuro Brunstrum levels for arm and hand: Arm;Hand Brunstrum level for arm: Stage IV Movement is deviating from synergy Brunstrum level for  hand: Stage III Synergies performed voluntarily LUE Tone LUE Tone: Mild RLE Assessment RLE Assessment: Within Functional Limits LLE Assessment LLE Assessment: Exceptions to [facility] LLE Strength Left Hip Flexion: 3/5 Left Hip Extension: 3/5 Left Knee Flexion: 3+/5 Left Knee Extension: 3+/5 Left Ankle Dorsiflexion: 3/5 Left Ankle Plantar Flexion: 3/5    Netta Corrigan, PT, DPT 04/29/2018, 7:52 AM

## 2018-04-29 NOTE — Progress Notes (Signed)
[facility] PHYSICAL MEDICINE & REHABILITATION PROGRESS NOTE   Subjective/Complaints:  Appreciate SW note Pt without c/os   ROS: Patient denies CP, SOB, N/V/D Objective:   No results found. No results for input(s): WBC, HGB, HCT, PLT in the last 72 hours. No results for input(s): NA, K, CL, CO2, GLUCOSE, BUN, CREATININE, CALCIUM in the last 72 hours.  Intake/Output Summary (Last 24 hours) at 04/29/2018 0929 Last data filed at 04/29/2018 0826 Gross per 24 hour  Intake 720 ml  Output -  Net 720 ml     Physical Exam: Vital Signs Blood pressure 106/75, pulse 90, temperature 97.6 F (36.4 C), temperature source Oral, resp. rate 18, height 5' 9.5" (1.765 m), weight 79.8 kg, SpO2 97 %.  Physical Exam Constitutional: No distress . Vital signs reviewed. HEENT: EOMI, oral membranes moist Neck: supple Cardiovascular: RRR without murmur. No JVD    Respiratory: CTA Bilaterally without wheezes or rales. Normal effort    GI: BS +, non-tender, non-distended  Musc: No edema or tenderness in extremities. Neurological:Alert, improved awareness Dysarthria Motor: LUE:2-/5 proximal to distal--- mild improvement LLE: 2+/5 HF, KE and 3/5 ADF/PF.  RUE/RLE grossly 4+/5 proximal to distal, stable  MAS 1 in Left finger flexors--no change Skin: Skin iswarm. He isnot diaphoretic.  Psychiatric: He has anormal mood and affect. Hisbehavior is normal.   Assessment/Plan: 1. Functional deficits secondary to RIght BG ICH Stable for D/C to SNF today F/u PCP in 3-4 weeks F/u PM&R 2 weeks See D/C summary See D/C instructions Care Tool:  Bathing    Body parts bathed by patient: Chest, Left arm, Abdomen, Front perineal area, Right upper leg, Left upper leg, Face, Buttocks, Right lower leg, Left lower leg   Body parts bathed by helper: Right arm     Bathing assist Assist Level: Minimal Assistance - Patient > 75%     Upper Body Dressing/Undressing Upper body dressing   What is the patient  wearing?: Pull over shirt    Upper body assist Assist Level: Supervision/Verbal cueing    Lower Body Dressing/Undressing Lower body dressing      What is the patient wearing?: Underwear/pull up, Pants     Lower body assist Assist for lower body dressing: Minimal Assistance - Patient > 75%     Toileting Toileting    Toileting assist Assist for toileting: Contact Guard/Touching assist     Transfers Chair/bed transfer  Transfers assist  Chair/bed transfer activity did not occur: Safety/medical concerns  Chair/bed transfer assist level: Minimal Assistance - Patient > 75%     Locomotion Ambulation   Ambulation assist      Assist level: Minimal Assistance - Patient > 75% Assistive device: Walker-rolling Max distance: 150 ft   Walk 10 feet activity   Assist     Assist level: Minimal Assistance - Patient > 75% Assistive device: Walker-rolling   Walk 50 feet activity   Assist Walk 50 feet with 2 turns activity did not occur: Safety/medical concerns  Assist level: Minimal Assistance - Patient > 75% Assistive device: Walker-rolling    Walk 150 feet activity   Assist Walk 150 feet activity did not occur: Safety/medical concerns  Assist level: Minimal Assistance - Patient > 75% Assistive device: Walker-rolling    Walk 10 feet on uneven surface  activity   Assist Walk 10 feet on uneven surfaces activity did not occur: Safety/medical concerns         Wheelchair     Assist Will patient use wheelchair at discharge?: Yes  Type of Wheelchair: Manual    Wheelchair assist level: Supervision/Verbal cueing Max wheelchair distance: 150    Wheelchair 50 feet with 2 turns activity    Assist        Assist Level: Supervision/Verbal cueing   Wheelchair 150 feet activity     Assist     Assist Level: Supervision/Verbal cueing    Medical Problem List and Plan: 1.  Left side weakness with dysarthria and dysphagia secondary to acute  right basal ganglia hemorrhage secondary to hypertensive crisis -continue CIR PT OT speech- new plan is for NHP Bed at [facility] today2. DVT Prophylaxis/ No Anticoagulation: SCDs 3. Pain Management:  Tylenol as needed, order resting hand splint 4. Mood:  Provide emotional support 5. Neuropsych: This patient is ?  Fully capable of making decisions on his own behalf. 6. Skin/Wound Care:  Routine skin checks  -Continue Weight shifting, OOB, appropriate nutrition 7. Fluids/Electrolytes/Nutrition:  Routine in and out's ,   421ml  1/26 not recorded 1/27                              8. Dysphagia. Dysphagia #3 nectar thick liquids. Follow-up speech therapy  Advance diet as tolerated 9. Hypertension. Lisinopril 2.5 mg (5 mg PTA) daily. Monitor with increased mobility Vitals:   04/28/18 1955 04/29/18 0534  BP: 138/74 106/75  Pulse: 75 90  Resp: 17 18  Temp: 98.5 F (36.9 C) 97.6 F (36.4 C)  SpO2: 98% 97%  well controlled on current meds 10. History of gout. Allopurinol 300 mg daily. 11. History of CAD with CABG. No chest pain or shortness of breath 12. CKD stage III. Baseline creatinine 1.40  Creatinine 1.29 on 1/8  Continue to monitor 13.  Cough- improved with RMT and with PPI 14.  Hypoalbuminemia  Supplement initiated on 1/11  16.  Urinary incont- offer urinal or toilet every 3hr while awake    LOS: 22 days A FACE TO [place] Gaylyn Berish 04/29/2018, 9:29 AM

## 2018-04-29 NOTE — Progress Notes (Signed)
Social Work [name], male   DOB: 09/13/1928, 85 y.o.   MRN: [id number] bed offer via [place] will plan for transfer after lunch today. Wife and pt aware along with team and MD.

## 2018-04-29 NOTE — Progress Notes (Signed)
Occupational Therapy Discharge Summary  Patient Details  Name: Jimmy [name] MRN: [id number] Date of Birth: 07-20-1928  Patient has met 12 of 74 long term goals due to improved activity tolerance, improved balance, postural control, ability to compensate for deficits, functional use of  LEFT upper extremity, improved attention, improved awareness and improved coordination.  Pt made steady progress with BADLs and functional transfers during this admission.  Pt requires min A for bathing and LB dressing tasks and CGA for shower transfers.  Pt continues to require min verbal cues for emergent awareness and standing balance when engaged in functional tasks. Pt discharging to SNF for continued rehab. Patient to discharge at [facility] Assist level.  Patient's care partner is not able to provide the physical and cognitive assist pt requires at d/c and therefore pt to d/c to SNF in order to cont to gain independence with ADLs and reduce caregiver burden.   Reasons goals not met: Pt requires mod A to use L UE at stabilizer level.   Recommendation:  Patient will benefit from ongoing skilled OT services in skilled nursing facility setting to continue to advance functional skills in the area of BADL and Reduce care partner burden.  Equipment: No equipment provided Discharge to SNF  Reasons for discharge: treatment goals met and discharge from hospital  Patient/family agrees with progress made and goals achieved: Yes  OT Discharge Perception  Perception: Impaired Comments: mild L inattention but improved since eval Praxis Praxis: Intact Cognition Overall Cognitive Status: Impaired/Different from baseline Arousal/Alertness: Awake/alert Orientation Level: Oriented X4 Attention: Sustained;Selective Sustained Attention: Appears intact Selective Attention: Impaired Selective Attention Impairment: Functional basic Memory: Impaired Awareness: Impaired Awareness Impairment: Emergent  impairment Behaviors: Lability Safety/Judgment: Impaired Sensation Sensation Light Touch: Impaired Detail Hot/Cold: Appears Intact Proprioception: Appears Intact Additional Comments: diminished sensation L side compared to R Coordination Gross Motor Movements are Fluid and Coordinated: No Fine Motor Movements are Fluid and Coordinated: No Coordination and Movement Description: coordination impaired secondary to L hemiparesis Motor  Motor Motor: Hemiplegia Motor - Skilled Clinical Observations: L hemiparesis Mobility  Bed Mobility Bed Mobility: Supine to Sit;Sit to Supine Supine to Sit: Supervision/Verbal cueing Sit to Supine: Supervision/Verbal cueing Transfers Sit to Stand: Supervision/Verbal cueing Stand to Sit: Supervision/Verbal cueing  Trunk/Postural Assessment  Cervical Assessment Cervical Assessment: Within Functional Limits Thoracic Assessment Thoracic Assessment: Exceptions to WFL(rounded shoulders) Lumbar Assessment Lumbar Assessment: Exceptions to WFL(posterior pelvic tilt) Postural Control Postural Control: Deficits on evaluation  Balance Balance Balance Assessed: Yes Static Sitting Balance Static Sitting - Level of Assistance: 5: Stand by assistance Dynamic Sitting Balance Dynamic Sitting - Level of Assistance: 5: Stand by assistance Static Standing Balance Static Standing - Level of Assistance: 5: Stand by assistance Dynamic Standing Balance Dynamic Standing - Level of Assistance: 4: Min assist Extremity/Trunk Assessment RUE Assessment RUE Assessment: Within Functional Limits LUE Assessment LUE Assessment: Exceptions to [facility] Passive Range of Motion (PROM) Comments: WFL Active Range of Motion (AROM) Comments: elbow flexion to 110, trace scapular elevation, minimal wrist flexion/extension and finger flexion/extension LUE Body System: Neuro Brunstrum levels for arm and hand: Arm;Hand Brunstrum level for arm: Stage IV Movement is deviating from  synergy Brunstrum level for hand: Stage III Synergies performed voluntarily LUE Tone LUE Tone: Mild   Jimmy [name] 04/29/2018, 7:55 AM

## 2018-04-30 ENCOUNTER — Inpatient Hospital Stay (HOSPITAL_COMMUNITY): Payer: Medicare Other | Admitting: Physical Therapy

## 2018-04-30 DIAGNOSIS — I5032 Chronic diastolic (congestive) heart failure: Secondary | ICD-10-CM | POA: Diagnosis not present

## 2018-04-30 DIAGNOSIS — C9110 Chronic lymphocytic leukemia of B-cell type not having achieved remission: Secondary | ICD-10-CM | POA: Diagnosis not present

## 2018-04-30 DIAGNOSIS — I10 Essential (primary) hypertension: Secondary | ICD-10-CM | POA: Diagnosis not present

## 2018-04-30 DIAGNOSIS — G8114 Spastic hemiplegia affecting left nondominant side: Secondary | ICD-10-CM | POA: Diagnosis not present

## 2018-04-30 DIAGNOSIS — D696 Thrombocytopenia, unspecified: Secondary | ICD-10-CM | POA: Diagnosis not present

## 2018-04-30 DIAGNOSIS — N183 Chronic kidney disease, stage 3 (moderate): Secondary | ICD-10-CM | POA: Diagnosis not present

## 2018-04-30 DIAGNOSIS — I25810 Atherosclerosis of coronary artery bypass graft(s) without angina pectoris: Secondary | ICD-10-CM | POA: Diagnosis not present

## 2018-05-18 DIAGNOSIS — I130 Hypertensive heart and chronic kidney disease with heart failure and stage 1 through stage 4 chronic kidney disease, or unspecified chronic kidney disease: Secondary | ICD-10-CM | POA: Diagnosis not present

## 2018-05-18 DIAGNOSIS — K219 Gastro-esophageal reflux disease without esophagitis: Secondary | ICD-10-CM | POA: Diagnosis not present

## 2018-05-18 DIAGNOSIS — Z856 Personal history of leukemia: Secondary | ICD-10-CM | POA: Diagnosis not present

## 2018-05-18 DIAGNOSIS — Z951 Presence of aortocoronary bypass graft: Secondary | ICD-10-CM | POA: Diagnosis not present

## 2018-05-18 DIAGNOSIS — I6911 Attention and concentration deficit following nontraumatic intracerebral hemorrhage: Secondary | ICD-10-CM | POA: Diagnosis not present

## 2018-05-18 DIAGNOSIS — Z9181 History of falling: Secondary | ICD-10-CM | POA: Diagnosis not present

## 2018-05-18 DIAGNOSIS — E538 Deficiency of other specified B group vitamins: Secondary | ICD-10-CM | POA: Diagnosis not present

## 2018-05-18 DIAGNOSIS — I2510 Atherosclerotic heart disease of native coronary artery without angina pectoris: Secondary | ICD-10-CM | POA: Diagnosis not present

## 2018-05-18 DIAGNOSIS — R1312 Dysphagia, oropharyngeal phase: Secondary | ICD-10-CM | POA: Diagnosis not present

## 2018-05-18 DIAGNOSIS — D696 Thrombocytopenia, unspecified: Secondary | ICD-10-CM | POA: Diagnosis not present

## 2018-05-18 DIAGNOSIS — I69191 Dysphagia following nontraumatic intracerebral hemorrhage: Secondary | ICD-10-CM | POA: Diagnosis not present

## 2018-05-18 DIAGNOSIS — N183 Chronic kidney disease, stage 3 (moderate): Secondary | ICD-10-CM | POA: Diagnosis not present

## 2018-05-18 DIAGNOSIS — I69115 Cognitive social or emotional deficit following nontraumatic intracerebral hemorrhage: Secondary | ICD-10-CM | POA: Diagnosis not present

## 2018-05-18 DIAGNOSIS — I5032 Chronic diastolic (congestive) heart failure: Secondary | ICD-10-CM | POA: Diagnosis not present

## 2018-05-18 DIAGNOSIS — I69154 Hemiplegia and hemiparesis following nontraumatic intracerebral hemorrhage affecting left non-dominant side: Secondary | ICD-10-CM | POA: Diagnosis not present

## 2018-05-18 DIAGNOSIS — I69122 Dysarthria following nontraumatic intracerebral hemorrhage: Secondary | ICD-10-CM | POA: Diagnosis not present

## 2018-05-18 DIAGNOSIS — R32 Unspecified urinary incontinence: Secondary | ICD-10-CM | POA: Diagnosis not present

## 2018-05-18 DIAGNOSIS — E559 Vitamin D deficiency, unspecified: Secondary | ICD-10-CM | POA: Diagnosis not present

## 2018-05-18 DIAGNOSIS — Z7982 Long term (current) use of aspirin: Secondary | ICD-10-CM | POA: Diagnosis not present

## 2018-05-18 DIAGNOSIS — E785 Hyperlipidemia, unspecified: Secondary | ICD-10-CM | POA: Diagnosis not present

## 2018-05-18 DIAGNOSIS — M109 Gout, unspecified: Secondary | ICD-10-CM | POA: Diagnosis not present

## 2018-05-19 ENCOUNTER — Telehealth: Payer: Self-pay | Admitting: Family Medicine

## 2018-05-19 DIAGNOSIS — I69191 Dysphagia following nontraumatic intracerebral hemorrhage: Secondary | ICD-10-CM | POA: Diagnosis not present

## 2018-05-19 DIAGNOSIS — I69122 Dysarthria following nontraumatic intracerebral hemorrhage: Secondary | ICD-10-CM | POA: Diagnosis not present

## 2018-05-19 DIAGNOSIS — R1312 Dysphagia, oropharyngeal phase: Secondary | ICD-10-CM | POA: Diagnosis not present

## 2018-05-19 DIAGNOSIS — I69154 Hemiplegia and hemiparesis following nontraumatic intracerebral hemorrhage affecting left non-dominant side: Secondary | ICD-10-CM | POA: Diagnosis not present

## 2018-05-19 DIAGNOSIS — I5032 Chronic diastolic (congestive) heart failure: Secondary | ICD-10-CM | POA: Diagnosis not present

## 2018-05-19 DIAGNOSIS — I130 Hypertensive heart and chronic kidney disease with heart failure and stage 1 through stage 4 chronic kidney disease, or unspecified chronic kidney disease: Secondary | ICD-10-CM | POA: Diagnosis not present

## 2018-05-19 NOTE — Telephone Encounter (Signed)
Verbal order given to [facility]

## 2018-05-19 NOTE — Telephone Encounter (Signed)
Please ok that verbal order  

## 2018-05-19 NOTE — Telephone Encounter (Signed)
Need verbal orders for home health nurse   Weekly x 5

## 2018-05-21 DIAGNOSIS — I130 Hypertensive heart and chronic kidney disease with heart failure and stage 1 through stage 4 chronic kidney disease, or unspecified chronic kidney disease: Secondary | ICD-10-CM | POA: Diagnosis not present

## 2018-05-21 DIAGNOSIS — I5032 Chronic diastolic (congestive) heart failure: Secondary | ICD-10-CM | POA: Diagnosis not present

## 2018-05-21 DIAGNOSIS — R1312 Dysphagia, oropharyngeal phase: Secondary | ICD-10-CM | POA: Diagnosis not present

## 2018-05-21 DIAGNOSIS — I69122 Dysarthria following nontraumatic intracerebral hemorrhage: Secondary | ICD-10-CM | POA: Diagnosis not present

## 2018-05-21 DIAGNOSIS — I69191 Dysphagia following nontraumatic intracerebral hemorrhage: Secondary | ICD-10-CM | POA: Diagnosis not present

## 2018-05-21 DIAGNOSIS — I69154 Hemiplegia and hemiparesis following nontraumatic intracerebral hemorrhage affecting left non-dominant side: Secondary | ICD-10-CM | POA: Diagnosis not present

## 2018-05-22 DIAGNOSIS — I130 Hypertensive heart and chronic kidney disease with heart failure and stage 1 through stage 4 chronic kidney disease, or unspecified chronic kidney disease: Secondary | ICD-10-CM | POA: Diagnosis not present

## 2018-05-22 DIAGNOSIS — I69122 Dysarthria following nontraumatic intracerebral hemorrhage: Secondary | ICD-10-CM | POA: Diagnosis not present

## 2018-05-22 DIAGNOSIS — I69191 Dysphagia following nontraumatic intracerebral hemorrhage: Secondary | ICD-10-CM | POA: Diagnosis not present

## 2018-05-22 DIAGNOSIS — I69154 Hemiplegia and hemiparesis following nontraumatic intracerebral hemorrhage affecting left non-dominant side: Secondary | ICD-10-CM | POA: Diagnosis not present

## 2018-05-22 DIAGNOSIS — R1312 Dysphagia, oropharyngeal phase: Secondary | ICD-10-CM | POA: Diagnosis not present

## 2018-05-22 DIAGNOSIS — I5032 Chronic diastolic (congestive) heart failure: Secondary | ICD-10-CM | POA: Diagnosis not present

## 2018-05-23 DIAGNOSIS — I5032 Chronic diastolic (congestive) heart failure: Secondary | ICD-10-CM | POA: Diagnosis not present

## 2018-05-23 DIAGNOSIS — I130 Hypertensive heart and chronic kidney disease with heart failure and stage 1 through stage 4 chronic kidney disease, or unspecified chronic kidney disease: Secondary | ICD-10-CM | POA: Diagnosis not present

## 2018-05-23 DIAGNOSIS — I69122 Dysarthria following nontraumatic intracerebral hemorrhage: Secondary | ICD-10-CM | POA: Diagnosis not present

## 2018-05-23 DIAGNOSIS — I69191 Dysphagia following nontraumatic intracerebral hemorrhage: Secondary | ICD-10-CM | POA: Diagnosis not present

## 2018-05-23 DIAGNOSIS — I69154 Hemiplegia and hemiparesis following nontraumatic intracerebral hemorrhage affecting left non-dominant side: Secondary | ICD-10-CM | POA: Diagnosis not present

## 2018-05-23 DIAGNOSIS — R1312 Dysphagia, oropharyngeal phase: Secondary | ICD-10-CM | POA: Diagnosis not present

## 2018-05-25 ENCOUNTER — Telehealth: Payer: Self-pay | Admitting: Family Medicine

## 2018-05-25 DIAGNOSIS — I69122 Dysarthria following nontraumatic intracerebral hemorrhage: Secondary | ICD-10-CM | POA: Diagnosis not present

## 2018-05-25 DIAGNOSIS — I69154 Hemiplegia and hemiparesis following nontraumatic intracerebral hemorrhage affecting left non-dominant side: Secondary | ICD-10-CM | POA: Diagnosis not present

## 2018-05-25 DIAGNOSIS — R1312 Dysphagia, oropharyngeal phase: Secondary | ICD-10-CM | POA: Diagnosis not present

## 2018-05-25 DIAGNOSIS — I5032 Chronic diastolic (congestive) heart failure: Secondary | ICD-10-CM | POA: Diagnosis not present

## 2018-05-25 DIAGNOSIS — I69191 Dysphagia following nontraumatic intracerebral hemorrhage: Secondary | ICD-10-CM | POA: Diagnosis not present

## 2018-05-25 DIAGNOSIS — I130 Hypertensive heart and chronic kidney disease with heart failure and stage 1 through stage 4 chronic kidney disease, or unspecified chronic kidney disease: Secondary | ICD-10-CM | POA: Diagnosis not present

## 2018-05-25 NOTE — Telephone Encounter (Signed)
Agree with is.  However, I'm not seeing patient until next month. If needs to be seen sooner for certification purposes, may need to send request to PM&R.

## 2018-05-25 NOTE — Telephone Encounter (Signed)
Christine with [facility] called to request verbal for PT for pt.  2x for 1 wk 1x for 2 wks [phone]- Christine's #- can leave voice mail

## 2018-05-25 NOTE — Telephone Encounter (Signed)
Christine advised

## 2018-05-26 ENCOUNTER — Telehealth: Payer: Self-pay

## 2018-05-26 NOTE — Telephone Encounter (Signed)
Agree with this. Thanks.  

## 2018-05-26 NOTE — Telephone Encounter (Signed)
Santiago Glad OT with [facility] request verbal orders for [facility] OT 1 x a wk for 1 wk; 2 x a wk for 2 wks; and 1 x a wk for 1 wk to improve pts strength and endurance, ADL training, transfers and home safety.Please advise.

## 2018-05-27 ENCOUNTER — Inpatient Hospital Stay (HOSPITAL_COMMUNITY)
Admission: RE | Admit: 2018-05-27 | Payer: Medicare Other | Source: Intra-hospital | Admitting: Physical Medicine & Rehabilitation

## 2018-05-27 DIAGNOSIS — I130 Hypertensive heart and chronic kidney disease with heart failure and stage 1 through stage 4 chronic kidney disease, or unspecified chronic kidney disease: Secondary | ICD-10-CM | POA: Diagnosis not present

## 2018-05-27 DIAGNOSIS — R1312 Dysphagia, oropharyngeal phase: Secondary | ICD-10-CM | POA: Diagnosis not present

## 2018-05-27 DIAGNOSIS — I69154 Hemiplegia and hemiparesis following nontraumatic intracerebral hemorrhage affecting left non-dominant side: Secondary | ICD-10-CM | POA: Diagnosis not present

## 2018-05-27 DIAGNOSIS — I69122 Dysarthria following nontraumatic intracerebral hemorrhage: Secondary | ICD-10-CM | POA: Diagnosis not present

## 2018-05-27 DIAGNOSIS — I5032 Chronic diastolic (congestive) heart failure: Secondary | ICD-10-CM | POA: Diagnosis not present

## 2018-05-27 DIAGNOSIS — I69191 Dysphagia following nontraumatic intracerebral hemorrhage: Secondary | ICD-10-CM | POA: Diagnosis not present

## 2018-05-27 NOTE — Telephone Encounter (Signed)
Left message on vm for Santiago Glad, of [facility], informing her Dr. Darnell Level is giving verbal orders for the services she requested.

## 2018-05-28 DIAGNOSIS — I130 Hypertensive heart and chronic kidney disease with heart failure and stage 1 through stage 4 chronic kidney disease, or unspecified chronic kidney disease: Secondary | ICD-10-CM | POA: Diagnosis not present

## 2018-05-28 DIAGNOSIS — I69154 Hemiplegia and hemiparesis following nontraumatic intracerebral hemorrhage affecting left non-dominant side: Secondary | ICD-10-CM | POA: Diagnosis not present

## 2018-05-28 DIAGNOSIS — I69122 Dysarthria following nontraumatic intracerebral hemorrhage: Secondary | ICD-10-CM | POA: Diagnosis not present

## 2018-05-28 DIAGNOSIS — R1312 Dysphagia, oropharyngeal phase: Secondary | ICD-10-CM | POA: Diagnosis not present

## 2018-05-28 DIAGNOSIS — I5032 Chronic diastolic (congestive) heart failure: Secondary | ICD-10-CM | POA: Diagnosis not present

## 2018-05-28 DIAGNOSIS — I69191 Dysphagia following nontraumatic intracerebral hemorrhage: Secondary | ICD-10-CM | POA: Diagnosis not present

## 2018-05-29 ENCOUNTER — Encounter: Payer: Medicare Other | Attending: Physical Medicine & Rehabilitation

## 2018-05-29 ENCOUNTER — Other Ambulatory Visit: Payer: Self-pay

## 2018-05-29 ENCOUNTER — Ambulatory Visit (HOSPITAL_BASED_OUTPATIENT_CLINIC_OR_DEPARTMENT_OTHER): Payer: Medicare Other | Admitting: Physical Medicine & Rehabilitation

## 2018-05-29 ENCOUNTER — Encounter: Payer: Self-pay | Admitting: Physical Medicine & Rehabilitation

## 2018-05-29 VITALS — BP 113/70 | HR 94 | Ht 69.5 in | Wt 157.8 lb

## 2018-05-29 DIAGNOSIS — I69398 Other sequelae of cerebral infarction: Secondary | ICD-10-CM | POA: Insufficient documentation

## 2018-05-29 DIAGNOSIS — I69154 Hemiplegia and hemiparesis following nontraumatic intracerebral hemorrhage affecting left non-dominant side: Secondary | ICD-10-CM | POA: Diagnosis not present

## 2018-05-29 DIAGNOSIS — I69191 Dysphagia following nontraumatic intracerebral hemorrhage: Secondary | ICD-10-CM | POA: Diagnosis not present

## 2018-05-29 DIAGNOSIS — I5032 Chronic diastolic (congestive) heart failure: Secondary | ICD-10-CM | POA: Diagnosis not present

## 2018-05-29 DIAGNOSIS — R269 Unspecified abnormalities of gait and mobility: Secondary | ICD-10-CM | POA: Insufficient documentation

## 2018-05-29 DIAGNOSIS — I130 Hypertensive heart and chronic kidney disease with heart failure and stage 1 through stage 4 chronic kidney disease, or unspecified chronic kidney disease: Secondary | ICD-10-CM | POA: Diagnosis not present

## 2018-05-29 DIAGNOSIS — R1312 Dysphagia, oropharyngeal phase: Secondary | ICD-10-CM | POA: Diagnosis not present

## 2018-05-29 DIAGNOSIS — I69122 Dysarthria following nontraumatic intracerebral hemorrhage: Secondary | ICD-10-CM | POA: Diagnosis not present

## 2018-05-29 NOTE — Patient Instructions (Addendum)
Please slow down especially with bathing and dressing  Will order outpt therapy at next visit

## 2018-05-29 NOTE — Progress Notes (Signed)
Subjective:    [name], male    DOB: [birth year], 85 y.o.   MRN: [id number] 85 year old right-handed male with history of CAD with CABG, CLL, CKD stage III, diastolic congestive heart failure, hypertension, CVA 2 years ago.  Initially maintained on Aggrenox.  He lives with spouse.  Presented  04/04/2018 with sudden onset of left-sided weakness and dysarthria.  Blood pressure 152/75.  Cranial CT scan showed an 8.8 mL acute intraparenchymal hematoma centered at the posterior right lentiform nucleus without significant regional mass effect.  MRA  negative.  Initially placed on Cardene drip for blood pressure control.  Mechanical soft diet with nectar thick liquids. HPI Back at home since D/C 1/29- to SNF until 2/14 Now Receiving home health therapy PT, OT, [facility] this am during dressing, no injury and fell during shower no injury (prior to grab bars being installed) [facility] 2/17 , wife called EMT  Pt feels he falls due to over reaching , wife feels pt is "impatient" No dizziness pr feeling of passing out Pain Inventory Average Pain 0 Pain Right Now 0 My pain is na  In the last 24 hours, has pain interfered with the following? General activity 0 Relation with others 0 Enjoyment of life 0 What TIME of day is your pain at its worst? na Sleep (in general) Good  Pain is worse with: na Pain improves with: na Relief from Meds: na  Mobility walk with assistance use a walker ability to climb steps?  no do you drive?  no  Function retired I need assistance with the following:  dressing  Neuro/Psych weakness numbness trouble walking  Prior Studies Any changes since last visit?  no  Physicians involved in your care Any changes since last visit?  no   Family History  Problem Relation Age of Onset  . Hypertension Mother   . Heart disease Mother   . Hyperlipidemia Mother   . Hypertension Father   . Hyperlipidemia Father   . Kidney cancer Sister    Renal cell cancer  . Alcohol abuse Brother   . Diabetes Brother   . Stroke Brother   . Heart attack Brother        MI  . Diabetes Other        Sister's daughter  . Depression Daughter        Bipolar   Social History   Socioeconomic History  . Marital status: Married    Spouse name: Not on file  . Number of children: Not on file  . Years of education: Not on file  . Highest education level: Not on file  Occupational History  . Occupation: Retired Information systems manager: RETIRED  Social Needs  . Financial resource strain: Not on file  . Food insecurity:    Worry: Not on file    Inability: Not on file  . Transportation needs:    Medical: Not on file    Non-medical: Not on file  Tobacco Use  . Smoking status: Former Smoker    Packs/day: 2.00    Years: 30.00    Pack years: 60.00    Last attempt to quit: 02/28/1979    Years since quitting: 39.2  . Smokeless tobacco: Never Used  . Tobacco comment: quit 1980  Substance and Sexual Activity  . Alcohol use: No    Alcohol/week: 0.0 standard drinks    Comment: occasional wine  . Drug use: Never  . Sexual activity: Yes  Lifestyle  .  Physical activity:    Days per week: Not on file    Minutes per session: Not on file  . Stress: Not on file  Relationships  . Social connections:    Talks on phone: Not on file    Gets together: Not on file    Attends religious service: Not on file    Active member of club or organization: Not on file    Attends meetings of clubs or organizations: Not on file    Relationship status: Not on file  Other Topics Concern  . Not on file  Social History Narrative   Married and lives with wife   1 son died of lung cancer at 13 years old   7 daughter bipolar, lives    Activity: walks 1 mi daily on treadmill    Diet: good water, some fruits/vegetables    Past Surgical History:  Procedure Laterality Date  . ANGIOPLASTY  1998  . CATARACT EXTRACTION, BILATERAL     R 1/09, L 8/09  . COLONOSCOPY   11/29/1987   Normal  . COLONOSCOPY  02/07/2003   Hemm. Internal, focal proctitis, negative biopsy  . COLONOSCOPY  12/12/2004   Internal external hemorrhoids, +proctits, negative biopsy  . CORONARY ANGIOPLASTY  1998  . CORONARY ARTERY BYPASS GRAFT  04/23/2001   x4, Dr. Pia Mau  . ESOPHAGOGASTRODUODENOSCOPY  11/29/1987   Gastritis  . ETT  12/10/2006   Persantine myoview nml  . HEMORROIDECTOMY  1954   Fissure repair, [place]  . HEPATIC ARTERY ANGIOPLASTY  1954   [place], [place]  . KIDNEY STONE SURGERY  1977   Dr Redmond Baseman  . LITHOTRIPSY  1990s   Multiple  . US ECHOCARDIOGRAPHY  07/2011   nl sys fxn, EF 55-60%, grade 1 diast dysfunction, mild AS, mildly elevated PA pressure   Past Medical History:  Diagnosis Date  . CAD (coronary artery disease)    a. 04/2001 S/P CABGx 4;  b. 2008 MV:  EF 63% normal perfusion.  . Carotid stenosis    a. 11/2012 Carotid U/S:  RICA 71-24%, LICA 58-09%.  . Chronic kidney disease, stage 3 ([place])   . CLL (chronic lymphoblastic leukemia) 2009   Stage IV; Dr. Ivor Messier - referred to Dr. Lissa Merlin [facility] 07/2013 now on Rituxan (10/2013) --> stage 0 (09/2014)  . Diastolic CHF ([place])    a. 11/8336 Echo: EF 55-65%, mild conc LVH, Gr 1 DD, mild AS, triv AI, mildly dil Ao root (3.5 cm).  . GERD (gastroesophageal reflux disease) 1990s  . History of CVA (cerebrovascular accident) 2015   by MRI - remote L internal capsule  . History of herpes genitalis    valtrex daily  . Hyperlipemia 2002  . Hypertension 2002  . Mass of submandibular region 2015   referred to gen surg after chemo  . Mild aortic stenosis    a. 07/2011 Echo: Mild AS, triv AI.  [name] Stroke ([place])   . Thrombocytopenia ([place])    outpatient goals Hgb >9, plt >20  . TIA (transient ischemic attack)    04/2016   BP 113/70   Pulse 94   Ht 5' 9.5" (1.765 m)   Wt 157 lb 12.8 oz (71.6 kg)   SpO2 95%   BMI 22.97 kg/m   Opioid Risk Score:   Fall Risk Score:  `1  Depression screen PHQ 2/9  Depression screen [facility]  2/9 05/29/2018 10/21/2017 10/11/2016 09/20/2015 09/16/2014 08/30/2013 08/27/2012  Decreased Interest 0 0 0 0 0 0 0  Down,  Depressed, Hopeless 0 0 0 0 0 0 0  PHQ - 2 Score 0 0 0 0 0 0 0  Altered sleeping - 0 - - - - -  Tired, decreased energy - 0 - - - - -  Change in appetite - 0 - - - - -  Feeling bad or failure about yourself  - 0 - - - - -  Trouble concentrating - 0 - - - - -  Moving slowly or fidgety/restless - 0 - - - - -  Suicidal thoughts - 0 - - - - -  PHQ-9 Score - 0 - - - - -  Difficult doing work/chores - Not difficult at all - - - - -  Some recent data might be hidden    Review of Systems  Constitutional: Positive for unexpected weight change.  HENT: Negative.   Eyes: Negative.   Respiratory: Positive for cough.   Cardiovascular: Negative.   Gastrointestinal: Positive for constipation.  Endocrine: Negative.   Genitourinary: Negative.   Musculoskeletal: Positive for gait problem.  Skin: Negative.   Allergic/Immunologic: Negative.   Neurological: Positive for weakness and numbness.  Hematological: Negative.   Psychiatric/Behavioral: Negative.   All other systems reviewed and are negative.      Objective:   Physical Exam Vitals signs and nursing note reviewed.  Constitutional:      Appearance: Normal appearance.  HENT:     Head: Normocephalic.     Nose: Nose normal.     Mouth/Throat:     Mouth: Mucous membranes are moist.     Pharynx: Oropharynx is clear.  Eyes:     General: No visual field deficit.    Extraocular Movements: Extraocular movements intact.     Conjunctiva/sclera: Conjunctivae normal.     Pupils: Pupils are equal, round, and reactive to light.  Skin:    General: Skin is warm and dry.  Neurological:     Mental Status: He is alert.     Cranial Nerves: No dysarthria or facial asymmetry.     Sensory: Sensation is intact.     Motor: No atrophy, abnormal muscle tone or seizure activity.     Coordination: Impaired rapid alternating movements.      Gait: Gait abnormal.     Comments: Using walker to maintain balance during ambulation  Psychiatric:        Mood and Affect: Mood normal.     Patient exhibits left inattention when he gets up to walk with his walker he does not place his left hand on the walker just the right hand.  He needs cueing for this. Visual confrontation test shows no evidence of double simultaneous extinction.  Also tactile confrontation shows no evidence of extinction.  He does have intact sensation on the left side to light touch in upper and lower limb His motor strength is 5/5 in the right deltoid bicep tricep grip hip flexion extensor ankle dorsiflexion 4+ in the left deltoid bicep tricep grip hip flexion extension ankle dorsiflexion.      Assessment & Plan:  1.  Nontraumatic right lentiform nucleus intracranial bleed with residual left neglect and poor safety awareness. He would benefit from further home health therapy at this point.  I think we can transition to outpatient therapy at next visit.  Return to rehab clinic in 3 weeks. Follow-up with PCP for secondary stroke prevention

## 2018-06-01 ENCOUNTER — Telehealth: Payer: Self-pay

## 2018-06-01 DIAGNOSIS — I69191 Dysphagia following nontraumatic intracerebral hemorrhage: Secondary | ICD-10-CM | POA: Diagnosis not present

## 2018-06-01 DIAGNOSIS — I5032 Chronic diastolic (congestive) heart failure: Secondary | ICD-10-CM | POA: Diagnosis not present

## 2018-06-01 DIAGNOSIS — I69122 Dysarthria following nontraumatic intracerebral hemorrhage: Secondary | ICD-10-CM | POA: Diagnosis not present

## 2018-06-01 DIAGNOSIS — R1312 Dysphagia, oropharyngeal phase: Secondary | ICD-10-CM | POA: Diagnosis not present

## 2018-06-01 DIAGNOSIS — I69154 Hemiplegia and hemiparesis following nontraumatic intracerebral hemorrhage affecting left non-dominant side: Secondary | ICD-10-CM | POA: Diagnosis not present

## 2018-06-01 DIAGNOSIS — I130 Hypertensive heart and chronic kidney disease with heart failure and stage 1 through stage 4 chronic kidney disease, or unspecified chronic kidney disease: Secondary | ICD-10-CM | POA: Diagnosis not present

## 2018-06-01 NOTE — Telephone Encounter (Signed)
Faxed order to [facility] for transport chair and cane.

## 2018-06-03 DIAGNOSIS — I5032 Chronic diastolic (congestive) heart failure: Secondary | ICD-10-CM | POA: Diagnosis not present

## 2018-06-03 DIAGNOSIS — I69191 Dysphagia following nontraumatic intracerebral hemorrhage: Secondary | ICD-10-CM | POA: Diagnosis not present

## 2018-06-03 DIAGNOSIS — I69122 Dysarthria following nontraumatic intracerebral hemorrhage: Secondary | ICD-10-CM | POA: Diagnosis not present

## 2018-06-03 DIAGNOSIS — R1312 Dysphagia, oropharyngeal phase: Secondary | ICD-10-CM | POA: Diagnosis not present

## 2018-06-03 DIAGNOSIS — I69154 Hemiplegia and hemiparesis following nontraumatic intracerebral hemorrhage affecting left non-dominant side: Secondary | ICD-10-CM | POA: Diagnosis not present

## 2018-06-03 DIAGNOSIS — I130 Hypertensive heart and chronic kidney disease with heart failure and stage 1 through stage 4 chronic kidney disease, or unspecified chronic kidney disease: Secondary | ICD-10-CM | POA: Diagnosis not present

## 2018-06-04 DIAGNOSIS — H02052 Trichiasis without entropian right lower eyelid: Secondary | ICD-10-CM | POA: Diagnosis not present

## 2018-06-04 DIAGNOSIS — I69191 Dysphagia following nontraumatic intracerebral hemorrhage: Secondary | ICD-10-CM | POA: Diagnosis not present

## 2018-06-04 DIAGNOSIS — H26492 Other secondary cataract, left eye: Secondary | ICD-10-CM | POA: Diagnosis not present

## 2018-06-04 DIAGNOSIS — Z961 Presence of intraocular lens: Secondary | ICD-10-CM | POA: Diagnosis not present

## 2018-06-04 DIAGNOSIS — R1312 Dysphagia, oropharyngeal phase: Secondary | ICD-10-CM | POA: Diagnosis not present

## 2018-06-04 DIAGNOSIS — I5032 Chronic diastolic (congestive) heart failure: Secondary | ICD-10-CM | POA: Diagnosis not present

## 2018-06-04 DIAGNOSIS — I69154 Hemiplegia and hemiparesis following nontraumatic intracerebral hemorrhage affecting left non-dominant side: Secondary | ICD-10-CM | POA: Diagnosis not present

## 2018-06-04 DIAGNOSIS — I130 Hypertensive heart and chronic kidney disease with heart failure and stage 1 through stage 4 chronic kidney disease, or unspecified chronic kidney disease: Secondary | ICD-10-CM | POA: Diagnosis not present

## 2018-06-04 DIAGNOSIS — H02055 Trichiasis without entropian left lower eyelid: Secondary | ICD-10-CM | POA: Diagnosis not present

## 2018-06-04 DIAGNOSIS — I69122 Dysarthria following nontraumatic intracerebral hemorrhage: Secondary | ICD-10-CM | POA: Diagnosis not present

## 2018-06-05 DIAGNOSIS — I69191 Dysphagia following nontraumatic intracerebral hemorrhage: Secondary | ICD-10-CM | POA: Diagnosis not present

## 2018-06-05 DIAGNOSIS — R1312 Dysphagia, oropharyngeal phase: Secondary | ICD-10-CM | POA: Diagnosis not present

## 2018-06-05 DIAGNOSIS — I130 Hypertensive heart and chronic kidney disease with heart failure and stage 1 through stage 4 chronic kidney disease, or unspecified chronic kidney disease: Secondary | ICD-10-CM | POA: Diagnosis not present

## 2018-06-05 DIAGNOSIS — I69154 Hemiplegia and hemiparesis following nontraumatic intracerebral hemorrhage affecting left non-dominant side: Secondary | ICD-10-CM | POA: Diagnosis not present

## 2018-06-05 DIAGNOSIS — I69122 Dysarthria following nontraumatic intracerebral hemorrhage: Secondary | ICD-10-CM | POA: Diagnosis not present

## 2018-06-05 DIAGNOSIS — I5032 Chronic diastolic (congestive) heart failure: Secondary | ICD-10-CM | POA: Diagnosis not present

## 2018-06-09 ENCOUNTER — Inpatient Hospital Stay: Payer: Medicare Other | Admitting: Neurology

## 2018-06-09 DIAGNOSIS — I5032 Chronic diastolic (congestive) heart failure: Secondary | ICD-10-CM | POA: Diagnosis not present

## 2018-06-09 DIAGNOSIS — R1312 Dysphagia, oropharyngeal phase: Secondary | ICD-10-CM | POA: Diagnosis not present

## 2018-06-09 DIAGNOSIS — I69122 Dysarthria following nontraumatic intracerebral hemorrhage: Secondary | ICD-10-CM | POA: Diagnosis not present

## 2018-06-09 DIAGNOSIS — I130 Hypertensive heart and chronic kidney disease with heart failure and stage 1 through stage 4 chronic kidney disease, or unspecified chronic kidney disease: Secondary | ICD-10-CM | POA: Diagnosis not present

## 2018-06-09 DIAGNOSIS — I69191 Dysphagia following nontraumatic intracerebral hemorrhage: Secondary | ICD-10-CM | POA: Diagnosis not present

## 2018-06-09 DIAGNOSIS — I69154 Hemiplegia and hemiparesis following nontraumatic intracerebral hemorrhage affecting left non-dominant side: Secondary | ICD-10-CM | POA: Diagnosis not present

## 2018-06-10 ENCOUNTER — Encounter: Payer: Self-pay | Admitting: Neurology

## 2018-06-10 ENCOUNTER — Other Ambulatory Visit: Payer: Self-pay

## 2018-06-10 ENCOUNTER — Ambulatory Visit (INDEPENDENT_AMBULATORY_CARE_PROVIDER_SITE_OTHER): Payer: Medicare Other | Admitting: Neurology

## 2018-06-10 VITALS — BP 98/56 | HR 96 | Ht 69.5 in | Wt 159.2 lb

## 2018-06-10 DIAGNOSIS — I610 Nontraumatic intracerebral hemorrhage in hemisphere, subcortical: Secondary | ICD-10-CM | POA: Diagnosis not present

## 2018-06-10 DIAGNOSIS — G8194 Hemiplegia, unspecified affecting left nondominant side: Secondary | ICD-10-CM

## 2018-06-10 NOTE — Patient Instructions (Signed)
I had a long d/w patient about his recent hemorrhagic stroke,left hemiplegia, risk for recurrent stroke/TIAs, personally independently reviewed imaging studies and stroke evaluation results and answered questions.Continue aspirin 81 mg daily  for secondary stroke prevention 1 prior history of ischemic TIAs and maintain strict control of hypertension with blood pressure goal below 130/90, diabetes with hemoglobin A1c goal below 6.5% and lipids with LDL cholesterol goal below 70 mg/dL. I also advised the patient to eat a healthy diet with plenty of whole grains, cereals, fruits and vegetables, exercise regularly and maintain ideal body weight .Recommend outpatient physical and occupational therapy once his home therapy finishes.  Followup in the future with my nurse practitioner Janett Billow in 3 months or call earlier if necessary

## 2018-06-10 NOTE — Progress Notes (Signed)
[facility]  PATIENT: Jimmy [name] DOB: 07-Jan-1929   REASON FOR VISIT: Follow-up for TIA January 2018 HISTORY FROM: Patient    HISTORY OF PRESENT ILLNESS:UPDATE 08/01/2018CM Jimmy [name], 85 year old male returns for follow-up with history of TIA events in January 2018. He is currently on Aggrenox twice daily without recurrent TIA or stroke symptoms. Denies any speech or visual difficulty. He has minimal bruising and no bleeding. Blood pressure in the office today 121/69. His lisinopril was recently decreased to 5 mg by cardiology. In addition he had 30 day event monitoring which did not show any arrhythmias or atrial fibrillation. He remains on Zocor and most recent LDL 49 on 10/11/2016. Most recent hemoglobin A1c 5.9. He continues to exercise riding a recumbent bike. He is driving without difficulty. He has no new neurologic complaints   HISTORY 05/02/16 Jimmy [name] is a 67 year Caucasian male seen today for first office follow-up visit following hospital admission for TIA in January 2018. History is obtained from the patient and review of hospital records. He presented on 04/08/16 with episode of sudden onset of tunnel vision and lightning like  visual disturbances to his the periphery of both eyes. This lasted several minutes subsequently had trouble speaking and expressing himself lasting 30-60 minutes. He knew what he wanted his stay but could not get the words out. He did not lose consciousness. He denied any accompanying headache. Patient states she's had similar episodes of visual disturbance for the last 40 years. They occur at a variable frequency but without any obvious triggers and last only few minutes and this episode in January was much more prolonged. Patient was admitted for a stroke workup. MRI scan the brain obtained was personally reviewed by me did not show an acute stroke but showed an old left basal ganglia infarct. Carotid ultrasound showed 40-59% right  ICA and no significant left ICA stenosis. LDL cholesterol was 46 mg percent and hemoglobin A1c was 5.9. Transthoracic echo showed normal ejection fraction and severe calcification with aortic valve with mild stenosis. Patient had been previously on aspirin and Plavix given his coronary artery disease this was switched to Aggrenox. Patient states that his done well since discharge and had no recurrent visual disturbance or speech problems however he was able to get his Aggrenox filled only few days ago. Is currently taking Aggrenox once a day with instruction to increase it after a week to twice daily if tolerated with headaches. States his blood pressure is under good control today is 112/71. Is tolerating Zocor well without muscle aches or pains. Update 06/10/2018 : Patient returns for follow-up after last visit 2 years ago.  He is accompanied by his wife.  History is obtained from them and review of hospital electronic medical records.  I personally reviewed imaging films in PACS.  Patient was hospitalized on 04/04/2018 with sudden onset of left hemiplegia.  On arrival his blood pressure was found to be slightly elevated at 152/75 and CT scan revealed an acute right basal ganglia hemorrhage with 9 cubic cc volume likely hypertensive in etiology.  He was admitted to the intensive care unit and blood pressure was tightly controlled.  Follow-up scan showed stable appearance of the hematoma.  MRI scan was obtained which confirmed acute basal ganglia hematoma/hemorrhagic infarct without intraventricular extension and midline shift.  MRI of the brain showed no aneurysm or large vessel stenosis or occlusion.  Carotid Doppler showed 40 to 59% right ICA and 1-39% left ICA stenosis.  Patient  was previously on Aggrenox which was held during the hospitalization.  He was seen by physical occupational therapy and transferred to inpatient rehab for 4 weeks and showed gradual improvement.  He is now been started on aspirin 81 mg  daily.  He is getting home physical occupational therapy.  He is able to walk with a cane or walker and needs some assistance to get up from the chair or bed.  He is still has significant weakness in his left upper extremity mostly distal muscles.  His left leg seems to be improving more though he does have left mild foot drop.  He states his blood pressure is usually runs low and today it is 96/53.  Is tolerating aspirin 81 without bruising or bleeding or other side effects.  He prefers to get outpatient physical and occupational therapy in [place].  REVIEW OF SYSTEMS: Full 14 system review of systems performed and notable only for those listed, all others are neg: Snoring, weakness, gait difficulty and all other systems negative    ALLERGIES: Allergies  Allergen Reactions   New Skin Other (See Comments)    unknown   Tape Dermatitis    HOME MEDICATIONS: Outpatient Medications Prior to Visit  Medication Sig Dispense Refill   acetaminophen (TYLENOL) 325 MG tablet Take 2 tablets (650 mg total) by mouth every 4 (four) hours as needed for mild pain (or temp > 37.5 C (99.5 F)).     allopurinol (ZYLOPRIM) 300 MG tablet TAKE 1 TABLET DAILY (Patient taking differently: Take 300 mg by mouth daily. ) 90 tablet 1   cholecalciferol (VITAMIN D) 1000 units tablet Take 2,000 Units by mouth daily.     lisinopril (PRINIVIL,ZESTRIL) 5 MG tablet Take 1 tablet (5 mg total) by mouth daily.     nitroGLYCERIN (NITROSTAT) 0.4 MG SL tablet Place 1 tablet (0.4 mg total) under the tongue every 5 (five) minutes as needed for chest pain (max 3 doses in 15 minutes). 25 tablet 3   simvastatin (ZOCOR) 10 MG tablet TAKE 1 TABLET AT BEDTIME (Patient taking differently: Take 10 mg by mouth daily. ) 90 tablet 3   valACYclovir (VALTREX) 500 MG tablet Take 1 tablet (500 mg total) by mouth daily.     vitamin B-12 (CYANOCOBALAMIN) 1000 MCG tablet Take 1 tablet (1,000 mcg total) by mouth daily.     pantoprazole  (PROTONIX) 40 MG tablet Take 1 tablet (40 mg total) by mouth at bedtime. (Patient not taking: Reported on 06/10/2018)     senna-docusate (SENOKOT-S) 8.6-50 MG tablet Take 1 tablet by mouth 2 (two) times daily. (Patient not taking: Reported on 06/10/2018)     No facility-administered medications prior to visit.     PAST MEDICAL HISTORY: Past Medical History:  Diagnosis Date   CAD (coronary artery disease)    a. 04/2001 S/P CABGx 4;  b. 2008 MV:  EF 63% normal perfusion.   Carotid stenosis    a. 11/2012 Carotid U/S:  RICA 26-94%, LICA 85-46%.   Chronic kidney disease, stage 3 (HCC)    CLL (chronic lymphoblastic leukemia) 2009   Stage IV; Dr. Ivor Messier - referred to Dr. Lissa Merlin [facility] 07/2013 now on Rituxan (10/2013) --> stage 0 (05/7033)   Diastolic CHF ([place])    a. 0/0938 Echo: EF 55-65%, mild conc LVH, Gr 1 DD, mild AS, triv AI, mildly dil Ao root (3.5 cm).   GERD (gastroesophageal reflux disease) 1990s   History of CVA (cerebrovascular accident) 2015   by MRI - remote L  internal capsule   History of herpes genitalis    valtrex daily   Hyperlipemia 2002   Hypertension 2002   Mass of submandibular region 2015   referred to gen surg after chemo   Mild aortic stenosis    a. 07/2011 Echo: Mild AS, triv AI.   Stroke [facility])    Thrombocytopenia [facility])    outpatient goals Hgb >9, plt >20   TIA (transient ischemic attack)    04/2016    PAST SURGICAL HISTORY: Past Surgical History:  Procedure Laterality Date   ANGIOPLASTY  1998   CATARACT EXTRACTION, BILATERAL     R 1/09, L 8/09   COLONOSCOPY  11/29/1987   Normal   COLONOSCOPY  02/07/2003   Hemm. Internal, focal proctitis, negative biopsy   COLONOSCOPY  12/12/2004   Internal external hemorrhoids, +proctits, negative biopsy   CORONARY ANGIOPLASTY  1998   CORONARY ARTERY BYPASS GRAFT  04/23/2001   x4, Dr. Pia Mau   ESOPHAGOGASTRODUODENOSCOPY  11/29/1987   Gastritis   ETT  12/10/2006   Persantine myoview nml    HEMORROIDECTOMY  1954   Fissure repair, [place]   HEPATIC ARTERY ANGIOPLASTY  1954   [place], [place]   KIDNEY STONE SURGERY  1977   Dr Redmond Baseman   LITHOTRIPSY  1990s   Multiple   US ECHOCARDIOGRAPHY  07/2011   nl sys fxn, EF 55-60%, grade 1 diast dysfunction, mild AS, mildly elevated PA pressure    FAMILY HISTORY: Family History  Problem Relation Age of Onset   Hypertension Mother    Heart disease Mother    Hyperlipidemia Mother    Hypertension Father    Hyperlipidemia Father    Kidney cancer Sister        Renal cell cancer   Alcohol abuse Brother    Diabetes Brother    Stroke Brother    Heart attack Brother        MI   Diabetes Other        Sister's daughter   Depression Daughter        Bipolar    SOCIAL HISTORY: Social History   Socioeconomic History   Marital status: Married    Spouse name: Not on file   Number of children: Not on file   Years of education: Not on file   Highest education level: Not on file  Occupational History   Occupation: Retired Information systems manager: RETIRED  Social Designer, fashion/clothing strain: Not on file   Food insecurity:    Worry: Not on file    Inability: Not on file   Transportation needs:    Medical: Not on file    Non-medical: Not on file  Tobacco Use   Smoking status: Former Smoker    Packs/day: 2.00    Years: 30.00    Pack years: 60.00    Last attempt to quit: 02/28/1979    Years since quitting: 39.3   Smokeless tobacco: Never Used   Tobacco comment: quit 1980  Substance and Sexual Activity   Alcohol use: No    Alcohol/week: 0.0 standard drinks    Comment: occasional wine   Drug use: Never   Sexual activity: Yes  Lifestyle   Physical activity:    Days per week: Not on file    Minutes per session: Not on file   Stress: Not on file  Relationships   Social connections:    Talks on phone: Not on file    Gets together: Not on file  Attends religious service: Not on file     Active member of club or organization: Not on file    Attends meetings of clubs or organizations: Not on file    Relationship status: Not on file   Intimate partner violence:    Fear of current or ex partner: Not on file    Emotionally abused: Not on file    Physically abused: Not on file    Forced sexual activity: Not on file  Other Topics Concern   Not on file  Social History Narrative   Married and lives with wife   1 son died of lung cancer at 51 years old   46 daughter bipolar, lives    Activity: walks 1 mi daily on treadmill    Diet: good water, some fruits/vegetables      PHYSICAL EXAM  Vitals:   06/10/18 0855  BP: (!) 98/56  Pulse: 96  Weight: 159 lb 3.2 oz (72.2 kg)  Height: 5' 9.5" (1.765 m)   Body mass index is 23.17 kg/m.  Generalized: Frail elderly Caucasian male in no acute distress  Head: normocephalic and atraumatic,.  Neck: Supple, no carotid bruits  Cardiac: Regular rate rhythm, Musculoskeletal: No deformity   Neurological examination   Mentation: Alert oriented to time, place, history taking. Attention span and concentration appropriate. Recent and remote memory intact.  Follows all commands speech and language fluent.   Cranial nerve II-XII: Pupils were equal round reactive to light extraocular movements were full, visual field were full on confrontational test. Facial sensation was normal. hearing was intact to finger rubbing bilaterally.  Mild left lower facial weakness.  Uvula tongue midline. head turning and shoulder shrug were normal and symmetric.Tongue protrusion into cheek strength was normal. Motor: Left hemiplegia with 3/5 left upper extremity strength with significant weakness of left grip and intrinsic hand muscles.  Left lower extremity strength is 4/5 with weakness of hip flexors and ankle dorsiflexors with mild left foot drop.  Tone is increased on the left compared to the right.  Normal strength on the right side.   Sensory: normal and  symmetric to light touch, pinprick, and  Vibration, in the upper and lower extremities Coordination: finger-nose-finger, heel-to-shin bilaterally, no dysmetria Reflexes: 1+ upper lower and symmetric, plantar responses were flexor bilaterally. Gait and Station: Rising up from seated position with  assistance, walks with a hemiplegic gait with stiffness of the left leg with mild foot drop. DIAGNOSTIC DATA (LABS, IMAGING, TESTING) - I reviewed patient records, labs, notes, testing and imaging myself where available.  Lab Results  Component Value Date   WBC 7.8 04/13/2018   HGB 14.5 04/13/2018   HCT 43.4 04/13/2018   MCV 95.8 04/13/2018   PLT 163 04/13/2018      Component Value Date/Time   NA 137 04/08/2018 0536   NA 141 06/01/2013 0950   K 3.7 04/08/2018 0536   K 4.8 06/01/2013 0950   CL 105 04/08/2018 0536   CL 106 01/15/2012 1446   CO2 24 04/08/2018 0536   CO2 28 06/01/2013 0950   GLUCOSE 108 (H) 04/08/2018 0536   GLUCOSE 130 06/01/2013 0950   GLUCOSE 143 (H) 01/15/2012 1446   BUN 15 04/08/2018 0536   BUN 22.3 06/01/2013 0950   CREATININE 1.29 (H) 04/08/2018 0536   CREATININE 1.7 (H) 06/01/2013 0950   CALCIUM 9.2 04/08/2018 0536   CALCIUM 9.8 06/01/2013 0950   PROT 6.1 (L) 04/08/2018 0536   PROT 6.6 06/01/2013 0950   ALBUMIN 3.4 (  L) 04/08/2018 0536   ALBUMIN 3.9 06/01/2013 0950   AST 19 04/08/2018 0536   AST 43 (H) 06/01/2013 0950   ALT 17 04/08/2018 0536   ALT 33 06/01/2013 0950   ALKPHOS 86 04/08/2018 0536   ALKPHOS 161 (H) 06/01/2013 0950   BILITOT 1.2 04/08/2018 0536   BILITOT 0.58 06/01/2013 0950   GFRNONAA 50 (L) 04/08/2018 0536   GFRAA 57 (L) 04/08/2018 0536   Lab Results  Component Value Date   CHOL 107 10/21/2017   HDL 36.80 (L) 10/21/2017   LDLCALC 39 10/21/2017   TRIG 157.0 (H) 10/21/2017   CHOLHDL 3 10/21/2017   Lab Results  Component Value Date   HGBA1C 6.0 10/21/2017       ASSESSMENT AND PLAN 39 year Caucasian male with transient episode  of expressive language difficulties and visual disturbance possible compromise migraine versus TIA. Long-standing history of multiple episodes of visual disturbances likely ocular migraine. No clinical history of stroke but MRI shows silent left basal ganglia infarct. Vascular risk factors of hypertension hyperlipidemia and cerebrovascular disease, carotid stenosis and coronary artery disease.  Recent admission in January 2020 due to small right basal ganglia hemorrhage with residual left hemiplegia.  PLAN: I had a long d/w patient about his recent hemorrhagic stroke,left hemiplegia, risk for recurrent stroke/TIAs, personally independently reviewed imaging studies and stroke evaluation results and answered questions.Continue aspirin 81 mg daily  for secondary stroke prevention 1 prior history of ischemic TIAs and maintain strict control of hypertension with blood pressure goal below 130/90, diabetes with hemoglobin A1c goal below 6.5% and lipids with LDL cholesterol goal below 70 mg/dL. I also advised the patient to eat a healthy diet with plenty of whole grains, cereals, fruits and vegetables, exercise regularly and maintain ideal body weight .Recommend outpatient physical and occupational therapy once his home therapy finishes.  Followup in the future with my nurse practitioner Janett Billow in 3 months or call earlier if necessary I spent 25 minutes in total face to face time with the patient more than 50% of which was spent counseling and coordination of care about his recent hemorrhagic stroke and discussion about stroke prevention and answering questions, reviewing test results reviewing medications and discussing and reviewing the diagnosis of TIA/stroke and management of risk factors, routine follow-up with primary care and cardiology   Antony Contras, MD [facility] [address], [place],  [address] [phone]

## 2018-06-11 DIAGNOSIS — I130 Hypertensive heart and chronic kidney disease with heart failure and stage 1 through stage 4 chronic kidney disease, or unspecified chronic kidney disease: Secondary | ICD-10-CM | POA: Diagnosis not present

## 2018-06-11 DIAGNOSIS — I69154 Hemiplegia and hemiparesis following nontraumatic intracerebral hemorrhage affecting left non-dominant side: Secondary | ICD-10-CM | POA: Diagnosis not present

## 2018-06-11 DIAGNOSIS — R1312 Dysphagia, oropharyngeal phase: Secondary | ICD-10-CM | POA: Diagnosis not present

## 2018-06-11 DIAGNOSIS — I5032 Chronic diastolic (congestive) heart failure: Secondary | ICD-10-CM | POA: Diagnosis not present

## 2018-06-11 DIAGNOSIS — I69122 Dysarthria following nontraumatic intracerebral hemorrhage: Secondary | ICD-10-CM | POA: Diagnosis not present

## 2018-06-11 DIAGNOSIS — I69191 Dysphagia following nontraumatic intracerebral hemorrhage: Secondary | ICD-10-CM | POA: Diagnosis not present

## 2018-06-12 DIAGNOSIS — I69154 Hemiplegia and hemiparesis following nontraumatic intracerebral hemorrhage affecting left non-dominant side: Secondary | ICD-10-CM | POA: Diagnosis not present

## 2018-06-12 DIAGNOSIS — I5032 Chronic diastolic (congestive) heart failure: Secondary | ICD-10-CM | POA: Diagnosis not present

## 2018-06-12 DIAGNOSIS — I69122 Dysarthria following nontraumatic intracerebral hemorrhage: Secondary | ICD-10-CM | POA: Diagnosis not present

## 2018-06-12 DIAGNOSIS — R1312 Dysphagia, oropharyngeal phase: Secondary | ICD-10-CM | POA: Diagnosis not present

## 2018-06-12 DIAGNOSIS — I69191 Dysphagia following nontraumatic intracerebral hemorrhage: Secondary | ICD-10-CM | POA: Diagnosis not present

## 2018-06-12 DIAGNOSIS — I130 Hypertensive heart and chronic kidney disease with heart failure and stage 1 through stage 4 chronic kidney disease, or unspecified chronic kidney disease: Secondary | ICD-10-CM | POA: Diagnosis not present

## 2018-06-16 ENCOUNTER — Encounter: Payer: Self-pay | Admitting: Family Medicine

## 2018-06-16 ENCOUNTER — Other Ambulatory Visit: Payer: Self-pay

## 2018-06-16 ENCOUNTER — Ambulatory Visit (INDEPENDENT_AMBULATORY_CARE_PROVIDER_SITE_OTHER): Payer: Medicare Other | Admitting: Family Medicine

## 2018-06-16 VITALS — BP 120/66 | HR 90 | Temp 97.6°F | Ht 69.5 in | Wt 157.6 lb

## 2018-06-16 DIAGNOSIS — I69391 Dysphagia following cerebral infarction: Secondary | ICD-10-CM

## 2018-06-16 DIAGNOSIS — R42 Dizziness and giddiness: Secondary | ICD-10-CM | POA: Diagnosis not present

## 2018-06-16 DIAGNOSIS — I10 Essential (primary) hypertension: Secondary | ICD-10-CM

## 2018-06-16 DIAGNOSIS — E782 Mixed hyperlipidemia: Secondary | ICD-10-CM | POA: Diagnosis not present

## 2018-06-16 DIAGNOSIS — I610 Nontraumatic intracerebral hemorrhage in hemisphere, subcortical: Secondary | ICD-10-CM

## 2018-06-16 DIAGNOSIS — R7303 Prediabetes: Secondary | ICD-10-CM | POA: Diagnosis not present

## 2018-06-16 DIAGNOSIS — N183 Chronic kidney disease, stage 3 unspecified: Secondary | ICD-10-CM

## 2018-06-16 LAB — CBC WITH DIFFERENTIAL/PLATELET
Basophils Absolute: 0 10*3/uL (ref 0.0–0.1)
Basophils Relative: 0.4 % (ref 0.0–3.0)
Eosinophils Absolute: 0.2 10*3/uL (ref 0.0–0.7)
Eosinophils Relative: 2.5 % (ref 0.0–5.0)
HCT: 45.2 % (ref 39.0–52.0)
Hemoglobin: 15.4 g/dL (ref 13.0–17.0)
Lymphocytes Relative: 14.7 % (ref 12.0–46.0)
Lymphs Abs: 1.2 10*3/uL (ref 0.7–4.0)
MCHC: 34.1 g/dL (ref 30.0–36.0)
MCV: 93.7 fl (ref 78.0–100.0)
Monocytes Absolute: 0.9 10*3/uL (ref 0.1–1.0)
Monocytes Relative: 11.2 % (ref 3.0–12.0)
Neutro Abs: 5.9 10*3/uL (ref 1.4–7.7)
Neutrophils Relative %: 71.2 % (ref 43.0–77.0)
Platelets: 170 10*3/uL (ref 150.0–400.0)
RBC: 4.83 Mil/uL (ref 4.22–5.81)
RDW: 13.2 % (ref 11.5–15.5)
WBC: 8.3 10*3/uL (ref 4.0–10.5)

## 2018-06-16 LAB — BASIC METABOLIC PANEL
BUN: 22 mg/dL (ref 6–23)
CO2: 28 mEq/L (ref 19–32)
Calcium: 10.1 mg/dL (ref 8.4–10.5)
Chloride: 102 mEq/L (ref 96–112)
Creatinine, Ser: 1.23 mg/dL (ref 0.40–1.50)
GFR: 55.63 mL/min — ABNORMAL LOW (ref >60.00)
Glucose, Bld: 100 mg/dL — ABNORMAL HIGH (ref 70–99)
Potassium: 4.5 mEq/L (ref 3.5–5.1)
Sodium: 138 mEq/L (ref 135–145)

## 2018-06-16 LAB — LIPID PANEL
Cholesterol: 119 mg/dL (ref 0–200)
HDL: 35.8 mg/dL — ABNORMAL LOW (ref >39.00)
LDL Cholesterol: 45 mg/dL (ref 0–99)
NonHDL: 83.28
Total CHOL/HDL Ratio: 3
Triglycerides: 191 mg/dL — ABNORMAL HIGH (ref 0.0–149.0)
VLDL: 38.2 mg/dL (ref 0.0–40.0)

## 2018-06-16 LAB — HEMOGLOBIN A1C: Hgb A1c MFr Bld: 5.6 % (ref 4.6–6.5)

## 2018-06-16 NOTE — Assessment & Plan Note (Signed)
Update labs.  

## 2018-06-16 NOTE — Assessment & Plan Note (Addendum)
Encouraged good hydration status. rec they check BP next dizzy episode. Continue HH PT with transition to outpatient PT when able.

## 2018-06-16 NOTE — Assessment & Plan Note (Signed)
This has significantly improved with speech therapy assistance.

## 2018-06-16 NOTE — Assessment & Plan Note (Deleted)
Update labs.  

## 2018-06-16 NOTE — Patient Instructions (Addendum)
Labs today. Continue current medicines. Keep physical appointment for August.  Good to see you today, call us with questions.

## 2018-06-16 NOTE — Assessment & Plan Note (Addendum)
Recent acute hemorrhagic infarction of R basal ganglia 04/2018 Appreciate PMR and neurology care. Now only on aspirin. Reviewed importance of good blood pressure and cholesterol control. Update labs today.

## 2018-06-16 NOTE — Assessment & Plan Note (Signed)
Chronic, stable. Goal BP <130/90. Continue current regimen.

## 2018-06-16 NOTE — Progress Notes (Signed)
BP 120/66 (BP Location: Right Arm, Patient Position: Sitting, Cuff Size: Normal)   Pulse 90   Temp 97.6 F (36.4 C) (Oral)   Ht 5' 9.5" (1.765 m)   Wt 157 lb 9 oz (71.5 kg)   SpO2 96%   BMI 22.93 kg/m    CC: hosp f/u visit Subjective:    [name], male    DOB: 07-Oct-1928, 85 y.o.   MRN: [id number]  HPI: Jimmy [name] is a 85 y.o. male presenting on 06/16/2018 for Follow-up (Here for skilled nurse f/u.  Pt accompanied by his wife, [name]. )   Recent hospitalization for acute onset L sided hemiparesis and dysarthria found to have 8.87mL acute intraparenchymal hematoma at posterior R lentiform nucleus, MRA negative.   Discharged to CIR from hospital, sent to [facility] 04/29/2018 and home 05/15/2018. Received HH care through [place] (SN and SLP and PT). OT ended last week. Planning outpt PT once [facility] completed. Walking daily by using wheelchair as a walker.   Saw PM&R late last month, note reviewed. Upcoming appt tomorrow.  Saw neurology Dr Leonie Man last week, note reviewed. Planned f/u 3 mo.   Aggrenox was changed to plain 81mg  aspirin. Goal BP <130/90.   Having dizzy episodes last week and again 2d ago described as lightheadedness. No vertigo or syncope/pre-syncope. Did not check BP. May not be staying well hydrated - but stops fluids by 8pm due to nocturia. Persistent L weakness.   Pt has fallen several times since home. Fortunately no injury.  Wife has fallen twice.   No urinary incontinence. Wears depends pull ups.  Having regular constipation, last BM 4d ago. Current bowel regimen is miralax 17gm daily.   Working on long term care insurance - for 2 hours of personal care services.   Date of Admission: 04/04/2018 Date of Discharge: 04/07/2018 Outside of window for TCM visit. Phone call not performed.   Attending Physician:  Garvin Fila, MD, Stroke MD Consultant(s):   Alysia Penna, MD (Physical Medicine & Rehabtilitation)   Discharge Diagnoses:   Principal Problem:   ICH (intracerebral hemorrhage) ([place]) Active Problems:   Mixed hyperlipidemia   Essential hypertension   Coronary atherosclerosis   History of CVA (cerebrovascular accident)   Dysphagia   Family history of stroke     Relevant past medical, surgical, family and social history reviewed and updated as indicated. Interim medical history since our last visit reviewed. Allergies and medications reviewed and updated. Outpatient Medications Prior to Visit  Medication Sig Dispense Refill  . acetaminophen (TYLENOL) 325 MG tablet Take 2 tablets (650 mg total) by mouth every 4 (four) hours as needed for mild pain (or temp > 37.5 C (99.5 F)).    [name] allopurinol (ZYLOPRIM) 300 MG tablet TAKE 1 TABLET DAILY (Patient taking differently: Take 300 mg by mouth daily. ) 90 tablet 1  . cholecalciferol (VITAMIN D) 1000 units tablet Take 2,000 Units by mouth daily.    [name] lisinopril (PRINIVIL,ZESTRIL) 5 MG tablet Take 1 tablet (5 mg total) by mouth daily.    . nitroGLYCERIN (NITROSTAT) 0.4 MG SL tablet Place 1 tablet (0.4 mg total) under the tongue every 5 (five) minutes as needed for chest pain (max 3 doses in 15 minutes). 25 tablet 3  . simvastatin (ZOCOR) 10 MG tablet TAKE 1 TABLET AT BEDTIME (Patient taking differently: Take 10 mg by mouth daily. ) 90 tablet 3  . valACYclovir (VALTREX) 500 MG tablet Take 1 tablet (500 mg total)  by mouth daily.    . vitamin B-12 (CYANOCOBALAMIN) 1000 MCG tablet Take 1 tablet (1,000 mcg total) by mouth daily.    . polyethylene glycol powder (GLYCOLAX/MIRALAX) powder Take 17 g by mouth daily.     No facility-administered medications prior to visit.      Per HPI unless specifically indicated in ROS section below Review of Systems Objective:    BP 120/66 (BP Location: Right Arm, Patient Position: Sitting, Cuff Size: Normal)   Pulse 90   Temp 97.6 F (36.4 C) (Oral)   Ht 5' 9.5" (1.765 m)   Wt 157 lb 9 oz (71.5 kg)   SpO2 96%   BMI 22.93 kg/m   Wt  Readings from Last 3 Encounters:  06/16/18 157 lb 9 oz (71.5 kg)  06/10/18 159 lb 3.2 oz (72.2 kg)  05/29/18 157 lb 12.8 oz (71.6 kg)    Physical Exam Vitals signs and nursing note reviewed.  Constitutional:      Appearance: Normal appearance. He is not ill-appearing.     Comments: In wheelchair  HENT:     Head: Normocephalic and atraumatic.     Mouth/Throat:     Mouth: Mucous membranes are moist.     Pharynx: Oropharynx is clear.  Eyes:     Extraocular Movements: Extraocular movements intact.     Pupils: Pupils are equal, round, and reactive to light.  Cardiovascular:     Rate and Rhythm: Normal rate and regular rhythm.     Pulses: Normal pulses.     Heart sounds: Normal heart sounds. No murmur.  Pulmonary:     Effort: Pulmonary effort is normal. No respiratory distress.     Breath sounds: Normal breath sounds. No wheezing, rhonchi or rales.  Neurological:     Mental Status: He is alert.     Cranial Nerves: No cranial nerve deficit.     Sensory: No sensory deficit.     Motor: Weakness present.     Comments: No facial weakness noted Diminished strength LUE, diminished grip strength on left EOMI  Psychiatric:        Mood and Affect: Mood normal.        Behavior: Behavior normal.       MRI/MRA IMPRESSION: No change or new finding by MRI. Acute hematoma/hemorrhagic infarction in the right basal ganglia with volume of 8.6 cc. Mild surrounding edema. Extensive small vessel ischemic changes elsewhere throughout the brain as outlined above. Negative intracranial MR angiography of the large and medium size vessels. No evidence of high-flow vascular abnormality in the region of hemorrhage. Electronically Signed   By: Nelson Chimes M.D.   On: 04/04/2018 11:56 Assessment & Plan:   Problem List Items Addressed This Visit    Prediabetes   Relevant Orders   Hemoglobin A1c   Mixed hyperlipidemia    Update labs.       Relevant Orders   Lipid panel   Basic metabolic panel    ICH (intracerebral hemorrhage) (HCC) - Primary    Recent acute hemorrhagic infarction of R basal ganglia 04/2018 Appreciate PMR and neurology care. Now only on aspirin. Reviewed importance of good blood pressure and cholesterol control. Update labs today.        Relevant Orders   CBC with Differential/Platelet   Essential hypertension    Chronic, stable. Goal BP <130/90. Continue current regimen.       Dysphagia, post-stroke    This has significantly improved with speech therapy assistance.  Dizziness    Encouraged good hydration status. rec they check BP next dizzy episode. Continue HH PT with transition to outpatient PT when able.      Chronic kidney disease, stage 3 ([place])    Update labs.           No orders of the defined types were placed in this encounter.  Orders Placed This Encounter  Procedures  . Lipid panel  . CBC with Differential/Platelet  . Basic metabolic panel  . Hemoglobin A1c    Follow up plan: No follow-ups on file.  Ria Bush, MD

## 2018-06-17 ENCOUNTER — Encounter: Payer: Medicare Other | Attending: Physical Medicine & Rehabilitation | Admitting: Registered Nurse

## 2018-06-17 VITALS — BP 124/69 | HR 68 | Ht 69.5 in | Wt 158.8 lb

## 2018-06-17 DIAGNOSIS — I69122 Dysarthria following nontraumatic intracerebral hemorrhage: Secondary | ICD-10-CM | POA: Diagnosis not present

## 2018-06-17 DIAGNOSIS — I2510 Atherosclerotic heart disease of native coronary artery without angina pectoris: Secondary | ICD-10-CM | POA: Diagnosis not present

## 2018-06-17 DIAGNOSIS — Z9181 History of falling: Secondary | ICD-10-CM | POA: Diagnosis not present

## 2018-06-17 DIAGNOSIS — I610 Nontraumatic intracerebral hemorrhage in hemisphere, subcortical: Secondary | ICD-10-CM | POA: Diagnosis not present

## 2018-06-17 DIAGNOSIS — I69115 Cognitive social or emotional deficit following nontraumatic intracerebral hemorrhage: Secondary | ICD-10-CM | POA: Diagnosis not present

## 2018-06-17 DIAGNOSIS — I5032 Chronic diastolic (congestive) heart failure: Secondary | ICD-10-CM | POA: Diagnosis not present

## 2018-06-17 DIAGNOSIS — K219 Gastro-esophageal reflux disease without esophagitis: Secondary | ICD-10-CM | POA: Diagnosis not present

## 2018-06-17 DIAGNOSIS — E538 Deficiency of other specified B group vitamins: Secondary | ICD-10-CM | POA: Diagnosis not present

## 2018-06-17 DIAGNOSIS — Z8673 Personal history of transient ischemic attack (TIA), and cerebral infarction without residual deficits: Secondary | ICD-10-CM | POA: Diagnosis not present

## 2018-06-17 DIAGNOSIS — Z951 Presence of aortocoronary bypass graft: Secondary | ICD-10-CM | POA: Diagnosis not present

## 2018-06-17 DIAGNOSIS — D696 Thrombocytopenia, unspecified: Secondary | ICD-10-CM | POA: Diagnosis not present

## 2018-06-17 DIAGNOSIS — I10 Essential (primary) hypertension: Secondary | ICD-10-CM

## 2018-06-17 DIAGNOSIS — I69191 Dysphagia following nontraumatic intracerebral hemorrhage: Secondary | ICD-10-CM | POA: Diagnosis not present

## 2018-06-17 DIAGNOSIS — M109 Gout, unspecified: Secondary | ICD-10-CM | POA: Diagnosis not present

## 2018-06-17 DIAGNOSIS — R1312 Dysphagia, oropharyngeal phase: Secondary | ICD-10-CM | POA: Diagnosis not present

## 2018-06-17 DIAGNOSIS — I69398 Other sequelae of cerebral infarction: Secondary | ICD-10-CM | POA: Diagnosis not present

## 2018-06-17 DIAGNOSIS — R269 Unspecified abnormalities of gait and mobility: Secondary | ICD-10-CM | POA: Insufficient documentation

## 2018-06-17 DIAGNOSIS — I6911 Attention and concentration deficit following nontraumatic intracerebral hemorrhage: Secondary | ICD-10-CM | POA: Diagnosis not present

## 2018-06-17 DIAGNOSIS — E559 Vitamin D deficiency, unspecified: Secondary | ICD-10-CM | POA: Diagnosis not present

## 2018-06-17 DIAGNOSIS — Z856 Personal history of leukemia: Secondary | ICD-10-CM | POA: Diagnosis not present

## 2018-06-17 DIAGNOSIS — I130 Hypertensive heart and chronic kidney disease with heart failure and stage 1 through stage 4 chronic kidney disease, or unspecified chronic kidney disease: Secondary | ICD-10-CM | POA: Diagnosis not present

## 2018-06-17 DIAGNOSIS — R32 Unspecified urinary incontinence: Secondary | ICD-10-CM | POA: Diagnosis not present

## 2018-06-17 DIAGNOSIS — E785 Hyperlipidemia, unspecified: Secondary | ICD-10-CM | POA: Diagnosis not present

## 2018-06-17 DIAGNOSIS — N183 Chronic kidney disease, stage 3 (moderate): Secondary | ICD-10-CM | POA: Diagnosis not present

## 2018-06-17 DIAGNOSIS — I69154 Hemiplegia and hemiparesis following nontraumatic intracerebral hemorrhage affecting left non-dominant side: Secondary | ICD-10-CM | POA: Diagnosis not present

## 2018-06-17 DIAGNOSIS — Z7982 Long term (current) use of aspirin: Secondary | ICD-10-CM | POA: Diagnosis not present

## 2018-06-17 NOTE — Progress Notes (Signed)
Subjective:    [name], male    DOB: [birth year], 85 y.o.   MRN: [id number]  HPI: Jimmy [name] is a 85 y.o. male who returns for follow up appointment for Basal Ganglia Hemorrhage and gait disturbance post- stroke. He states he has  pain in his left shoulder and left buttock, he denies falling, he refuses x-ray . He rates his pain 8. He continues with outpatient Rehabilitation with [facility].  Mrs. Addo states once he completes the Home Therapy he will begin outpatient therapy, she was instructed to call office if any problems arises.   Dr. Letta Pate note was reviewed.     Pain Inventory Average Pain 8 Pain Right Now 8 My pain is aching  In the last 24 hours, has pain interfered with the following? General activity 8 Relation with others 8 Enjoyment of life 8 What TIME of day is your pain at its worst? morning Sleep (in general) Good  Pain is worse with: standing Pain improves with: na Relief from Meds: na  Mobility walk with assistance use a walker ability to climb steps?  no do you drive?  no use a wheelchair needs help with transfers  Function retired I need assistance with the following:  dressing, bathing, meal prep, household duties and shopping  Neuro/Psych trouble walking dizziness loss of taste or smell  Prior Studies Any changes since last visit?  no  Physicians involved in your care Any changes since last visit?  no   Family History  Problem Relation Age of Onset  . Hypertension Mother   . Heart disease Mother   . Hyperlipidemia Mother   . Hypertension Father   . Hyperlipidemia Father   . Kidney cancer Sister        Renal cell cancer  . Alcohol abuse Brother   . Diabetes Brother   . Stroke Brother   . Heart attack Brother        MI  . Diabetes Other        Sister's daughter  . Depression Daughter        Bipolar   Social History   Socioeconomic History  . Marital status: Married    Spouse name:  Not on file  . Number of children: Not on file  . Years of education: Not on file  . Highest education level: Not on file  Occupational History  . Occupation: Retired Information systems manager: RETIRED  Social Needs  . Financial resource strain: Not on file  . Food insecurity:    Worry: Not on file    Inability: Not on file  . Transportation needs:    Medical: Not on file    Non-medical: Not on file  Tobacco Use  . Smoking status: Former Smoker    Packs/day: 2.00    Years: 30.00    Pack years: 60.00    Last attempt to quit: 02/28/1979    Years since quitting: 39.3  . Smokeless tobacco: Never Used  . Tobacco comment: quit 1980  Substance and Sexual Activity  . Alcohol use: No    Alcohol/week: 0.0 standard drinks    Comment: occasional wine  . Drug use: Never  . Sexual activity: Yes  Lifestyle  . Physical activity:    Days per week: Not on file    Minutes per session: Not on file  . Stress: Not on file  Relationships  . Social connections:    Talks on phone: Not on file  Gets together: Not on file    Attends religious service: Not on file    Active member of club or organization: Not on file    Attends meetings of clubs or organizations: Not on file    Relationship status: Not on file  Other Topics Concern  . Not on file  Social History Narrative   Married and lives with wife   1 son died of lung cancer at 58 years old   29 daughter bipolar, lives    Activity: walks 1 mi daily on treadmill    Diet: good water, some fruits/vegetables    Past Surgical History:  Procedure Laterality Date  . ANGIOPLASTY  1998  . CATARACT EXTRACTION, BILATERAL     R 1/09, L 8/09  . COLONOSCOPY  11/29/1987   Normal  . COLONOSCOPY  02/07/2003   Hemm. Internal, focal proctitis, negative biopsy  . COLONOSCOPY  12/12/2004   Internal external hemorrhoids, +proctits, negative biopsy  . CORONARY ANGIOPLASTY  1998  . CORONARY ARTERY BYPASS GRAFT  04/23/2001   x4, Dr. Pia Mau  .  ESOPHAGOGASTRODUODENOSCOPY  11/29/1987   Gastritis  . ETT  12/10/2006   Persantine myoview nml  . HEMORROIDECTOMY  1954   Fissure repair, [place]  . HEPATIC ARTERY ANGIOPLASTY  1954   [place], [place]  . KIDNEY STONE SURGERY  1977   Dr Redmond Baseman  . LITHOTRIPSY  1990s   Multiple  . US ECHOCARDIOGRAPHY  07/2011   nl sys fxn, EF 55-60%, grade 1 diast dysfunction, mild AS, mildly elevated PA pressure   Past Medical History:  Diagnosis Date  . CAD (coronary artery disease)    a. 04/2001 S/P CABGx 4;  b. 2008 MV:  EF 63% normal perfusion.  . Carotid stenosis    a. 11/2012 Carotid U/S:  RICA 27-03%, LICA 50-09%.  . Chronic kidney disease, stage 3 ([place])   . CLL (chronic lymphoblastic leukemia) 2009   Stage IV; Dr. Ivor Messier - referred to Dr. Lissa Merlin [facility] 07/2013 now on Rituxan (10/2013) --> stage 0 (09/2014)  . Diastolic CHF ([place])    a. 05/8180 Echo: EF 55-65%, mild conc LVH, Gr 1 DD, mild AS, triv AI, mildly dil Ao root (3.5 cm).  . GERD (gastroesophageal reflux disease) 1990s  . History of CVA (cerebrovascular accident) 2015   by MRI - remote L internal capsule  . History of herpes genitalis    valtrex daily  . Hyperlipemia 2002  . Hypertension 2002  . Mass of submandibular region 2015   referred to gen surg after chemo  . Mild aortic stenosis    a. 07/2011 Echo: Mild AS, triv AI.  [name] Stroke ([place])   . Thrombocytopenia ([place])    outpatient goals Hgb >9, plt >20  . TIA (transient ischemic attack)    04/2016   BP 124/69   Pulse 68   Ht 5' 9.5" (1.765 m)   Wt 158 lb 12.8 oz (72 kg)   SpO2 94%   BMI 23.11 kg/m   Opioid Risk Score:   Fall Risk Score:  `1  Depression screen PHQ 2/9  Depression screen [facility] 2/9 06/17/2018 05/29/2018 10/21/2017 10/11/2016 09/20/2015 09/16/2014 08/30/2013  Decreased Interest 0 0 0 0 0 0 0  Down, Depressed, Hopeless 0 0 0 0 0 0 0  PHQ - 2 Score 0 0 0 0 0 0 0  Altered sleeping - - 0 - - - -  Tired, decreased energy - - 0 - - - -  Change in  appetite - - 0 - - - -   Feeling bad or failure about yourself  - - 0 - - - -  Trouble concentrating - - 0 - - - -  Moving slowly or fidgety/restless - - 0 - - - -  Suicidal thoughts - - 0 - - - -  PHQ-9 Score - - 0 - - - -  Difficult doing work/chores - - Not difficult at all - - - -  Some recent data might be hidden   Review of Systems  Constitutional: Positive for appetite change and unexpected weight change.  HENT: Negative.   Eyes: Negative.   Respiratory: Positive for cough.   Cardiovascular: Negative.   Gastrointestinal: Positive for constipation.  Endocrine: Negative.   Genitourinary: Negative.   Musculoskeletal: Negative.   Skin: Negative.   Allergic/Immunologic: Negative.   Neurological: Negative.   Hematological: Negative.   Psychiatric/Behavioral: Negative.   All other systems reviewed and are negative.      Objective:   Physical Exam Constitutional:      Appearance: Normal appearance.  Neck:     Musculoskeletal: Normal range of motion and neck supple.  Cardiovascular:     Rate and Rhythm: Normal rate and regular rhythm.     Pulses: Normal pulses.     Heart sounds: Normal heart sounds.  Pulmonary:     Effort: Pulmonary effort is normal.     Breath sounds: Normal breath sounds.  Musculoskeletal:     Comments: Normal Muscle Bulk and Muscle Testing Reveals:  Upper Extremities: Right: Full ROM and Muscle Strength 5/5 Left: Decreased ROM 30 Degrees and Muscle Strength 3/5 Lower Extremities: Right: Full ROM and Muscle Strength 5/5 Left: Decreased ROM and Muscle Strength 4/5 Arrived in wheelchair   Skin:    General: Skin is warm and dry.  Neurological:     Mental Status: He is alert and oriented to person, place, and time.  Psychiatric:        Mood and Affect: Mood normal.        Behavior: Behavior normal.           Assessment & Plan:  1. Basal Ganglia Hemorrhage/ History of CVA: Continue with [place] with [facility], he will be Transitioned to  Outpatient Therapy. 2. Essential Hypertension: Continue current medication regimen. PCP Following.   20 minutes of face to face patient care time was spent during this visit. All questions were encouraged and answered.  F/U with Dr Letta Pate in 4-6 weeks

## 2018-06-18 ENCOUNTER — Encounter: Payer: Self-pay | Admitting: Registered Nurse

## 2018-06-18 ENCOUNTER — Telehealth: Payer: Self-pay | Admitting: Family Medicine

## 2018-06-18 DIAGNOSIS — I69154 Hemiplegia and hemiparesis following nontraumatic intracerebral hemorrhage affecting left non-dominant side: Secondary | ICD-10-CM | POA: Diagnosis not present

## 2018-06-18 DIAGNOSIS — I130 Hypertensive heart and chronic kidney disease with heart failure and stage 1 through stage 4 chronic kidney disease, or unspecified chronic kidney disease: Secondary | ICD-10-CM | POA: Diagnosis not present

## 2018-06-18 DIAGNOSIS — R1312 Dysphagia, oropharyngeal phase: Secondary | ICD-10-CM | POA: Diagnosis not present

## 2018-06-18 DIAGNOSIS — I69122 Dysarthria following nontraumatic intracerebral hemorrhage: Secondary | ICD-10-CM | POA: Diagnosis not present

## 2018-06-18 DIAGNOSIS — I69191 Dysphagia following nontraumatic intracerebral hemorrhage: Secondary | ICD-10-CM | POA: Diagnosis not present

## 2018-06-18 DIAGNOSIS — I5032 Chronic diastolic (congestive) heart failure: Secondary | ICD-10-CM | POA: Diagnosis not present

## 2018-06-18 NOTE — Telephone Encounter (Signed)
FYI: pt is being discharged from [facility].

## 2018-06-18 NOTE — Telephone Encounter (Signed)
noted 

## 2018-06-22 DIAGNOSIS — H903 Sensorineural hearing loss, bilateral: Secondary | ICD-10-CM | POA: Diagnosis not present

## 2018-06-22 DIAGNOSIS — H6123 Impacted cerumen, bilateral: Secondary | ICD-10-CM | POA: Diagnosis not present

## 2018-07-16 ENCOUNTER — Telehealth: Payer: Self-pay | Admitting: Family Medicine

## 2018-07-16 ENCOUNTER — Other Ambulatory Visit: Payer: Self-pay | Admitting: Family Medicine

## 2018-07-16 NOTE — Telephone Encounter (Signed)
Patient's Wife called today stating that the Doctor at the ER wanted the patient to stop the Dipyridamole. . Patient has not taken this medication since January    Patient is also needing a refill VALTREX  He has 2 weeks left of the medication.   [place], [place]

## 2018-07-17 MED ORDER — ASPIRIN 81 MG PO TABS: 81.0000 mg | ORAL_TABLET | Freq: Every day | ORAL | Status: DC

## 2018-07-17 MED ORDER — VALACYCLOVIR HCL 500 MG PO TABS: 500.0000 mg | ORAL_TABLET | Freq: Every day | ORAL | 1 refills | Status: DC

## 2018-07-17 NOTE — Telephone Encounter (Signed)
Patient's wife notified as instructed by telephone and verbalized understanding. Mrs. Docken stated that her husband is taking plain aspirin 81 mg daily. Mrs. Collingsworth wanted to let you know that is weight is down to 150 pounds.

## 2018-07-17 NOTE — Telephone Encounter (Signed)
Noted. Is he taking plain aspirin 81mg  daily?  Ok to be off aggrenox (dipyridamole). Valtrex refilled.

## 2018-08-04 ENCOUNTER — Telehealth: Payer: Self-pay | Admitting: Cardiovascular Disease

## 2018-08-04 NOTE — Telephone Encounter (Signed)
Virtual Visit Pre-Appointment Phone Call  "(Name), I am calling you today to discuss your upcoming appointment. We are currently trying to limit exposure to the virus that causes COVID-19 by seeing patients at home rather than in the office."  1. "What is the BEST phone number to call the day of the visit?" - include this in appointment notes  2. Do you have or have access to (through a family member/friend) a smartphone with video capability that we can use for your visit?" a. If yes - list this number in appt notes as cell (if different from BEST phone #) and list the appointment type as a VIDEO visit in appointment notes b. If no - list the appointment type as a PHONE visit in appointment notes  3. Confirm consent - "In the setting of the current Covid19 crisis, you are scheduled for a (phone or video) visit with your provider on (date) at (time).  Just as we do with many in-office visits, in order for you to participate in this visit, we must obtain consent.  If you'd like, I can send this to your mychart (if signed up) or email for you to review.  Otherwise, I can obtain your verbal consent now.  All virtual visits are billed to your insurance company just like a normal visit would be.  By agreeing to a virtual visit, we'd like you to understand that the technology does not allow for your provider to perform an examination, and thus may limit your provider's ability to fully assess your condition. If your provider identifies any concerns that need to be evaluated in person, we will make arrangements to do so.  Finally, though the technology is pretty good, we cannot assure that it will always work on either your or our end, and in the setting of a video visit, we may have to convert it to a phone-only visit.  In either situation, we cannot ensure that we have a secure connection.  Are you willing to proceed?" STAFF: Did the patient verbally acknowledge consent to telehealth visit? Document  YES/NO here: Yes   4. Advise patient to be prepared - "Two hours prior to your appointment, go ahead and check your blood pressure, pulse, oxygen saturation, and your weight (if you have the equipment to check those) and write them all down. When your visit starts, your provider will ask you for this information. If you have an Apple Watch or Kardia device, please plan to have heart rate information ready on the day of your appointment. Please have a pen and paper handy nearby the day of the visit as well."  5. Give patient instructions for MyChart download to smartphone OR Doximity/Doxy.me as below if video visit (depending on what platform provider is using)  6. Inform patient they will receive a phone call 15 minutes prior to their appointment time (may be from unknown caller ID) so they should be prepared to answer    Jimmy [name] has been deemed a candidate for a follow-up tele-health visit to limit community exposure during the Covid-19 pandemic. I spoke with the patient via phone to ensure availability of phone/video source, confirm preferred email & phone number, and discuss instructions and expectations.  I reminded Jimmy [name] to be prepared with any vital sign and/or heart rhythm information that could potentially be obtained via home monitoring, at the time of his visit. I reminded Jimmy [name] to expect a phone call prior  to his visit.  Jimmy [name] 08/04/2018 11:43 AM   INSTRUCTIONS FOR DOWNLOADING THE MYCHART APP TO SMARTPHONE  - The patient must first make sure to have activated MyChart and know their login information - If Apple, go to [employer] and type in MyChart in the search bar and download the app. If Android, ask patient to go to [employer] and type in [place] in the search bar and download the app. The app is free but as with any other app downloads, their phone may require them to verify saved payment information or  Apple/Android password.  - The patient will need to then log into the app with their MyChart username and password, and select [facility] as their healthcare provider to link the account. When it is time for your visit, go to the MyChart app, find appointments, and click Begin Video Visit. Be sure to Select Allow for your device to access the Microphone and Camera for your visit. You will then be connected, and your provider will be with you shortly.  **If they have any issues connecting, or need assistance please contact MyChart service desk [phone])**  **If using a computer, in order to ensure the best quality for their visit they will need to use either of the following Internet Browsers: [employer], or Google Chrome**  IF USING DOXIMITY or DOXY.ME - The patient will receive a link just prior to their visit by text.     FULL LENGTH CONSENT FOR TELE-HEALTH VISIT   I hereby voluntarily request, consent and authorize [facility] and its employed or contracted physicians, physician assistants, nurse practitioners or other licensed health care professionals (the Practitioner), to provide me with telemedicine health care services (the Services") as deemed necessary by the treating Practitioner. I acknowledge and consent to receive the Services by the Practitioner via telemedicine. I understand that the telemedicine visit will involve communicating with the Practitioner through live audiovisual communication technology and the disclosure of certain medical information by electronic transmission. I acknowledge that I have been given the opportunity to request an in-person assessment or other available alternative prior to the telemedicine visit and am voluntarily participating in the telemedicine visit.  I understand that I have the right to withhold or withdraw my consent to the use of telemedicine in the course of my care at any time, without affecting my right to future care  or treatment, and that the Practitioner or I may terminate the telemedicine visit at any time. I understand that I have the right to inspect all information obtained and/or recorded in the course of the telemedicine visit and may receive copies of available information for a reasonable fee.  I understand that some of the potential risks of receiving the Services via telemedicine include:   Delay or interruption in medical evaluation due to technological equipment failure or disruption;  Information transmitted may not be sufficient (e.g. poor resolution of images) to allow for appropriate medical decision making by the Practitioner; and/or   In rare instances, security protocols could fail, causing a breach of personal health information.  Furthermore, I acknowledge that it is my responsibility to provide information about my medical history, conditions and care that is complete and accurate to the best of my ability. I acknowledge that Practitioner's advice, recommendations, and/or decision may be based on factors not within their control, such as incomplete or inaccurate data provided by me or distortions of diagnostic images or specimens that may result from electronic transmissions. I  understand that the practice of medicine is not an exact science and that Practitioner makes no warranties or guarantees regarding treatment outcomes. I acknowledge that I will receive a copy of this consent concurrently upon execution via email to the email address I last provided but may also request a printed copy by calling the office of [facility].    I understand that my insurance will be billed for this visit.   I have read or had this consent read to me.  I understand the contents of this consent, which adequately explains the benefits and risks of the Services being provided via telemedicine.   I have been provided ample opportunity to ask questions regarding this consent and the Services and have had  my questions answered to my satisfaction.  I give my informed consent for the services to be provided through the use of telemedicine in my medical care  By participating in this telemedicine visit I agree to the above.

## 2018-08-06 ENCOUNTER — Other Ambulatory Visit: Payer: Self-pay

## 2018-08-06 ENCOUNTER — Telehealth (INDEPENDENT_AMBULATORY_CARE_PROVIDER_SITE_OTHER): Payer: Medicare Other | Admitting: Cardiovascular Disease

## 2018-08-06 DIAGNOSIS — C9110 Chronic lymphocytic leukemia of B-cell type not having achieved remission: Secondary | ICD-10-CM

## 2018-08-06 DIAGNOSIS — I739 Peripheral vascular disease, unspecified: Secondary | ICD-10-CM

## 2018-08-06 DIAGNOSIS — I25708 Atherosclerosis of coronary artery bypass graft(s), unspecified, with other forms of angina pectoris: Secondary | ICD-10-CM | POA: Diagnosis not present

## 2018-08-06 DIAGNOSIS — I10 Essential (primary) hypertension: Secondary | ICD-10-CM

## 2018-08-06 DIAGNOSIS — I6389 Other cerebral infarction: Secondary | ICD-10-CM | POA: Diagnosis not present

## 2018-08-06 DIAGNOSIS — E782 Mixed hyperlipidemia: Secondary | ICD-10-CM

## 2018-08-06 DIAGNOSIS — I6529 Occlusion and stenosis of unspecified carotid artery: Secondary | ICD-10-CM | POA: Diagnosis not present

## 2018-08-06 NOTE — Patient Instructions (Addendum)

## 2018-08-06 NOTE — Progress Notes (Signed)
Virtual Visit via Telephone Note   This visit type was conducted due to national recommendations for restrictions regarding the COVID-19 Pandemic (e.g. social distancing) in an effort to limit this patient's exposure and mitigate transmission in our community.  Due to his co-morbid illnesses, this patient is at least at moderate risk for complications without adequate follow up.  This format is felt to be most appropriate for this patient at this time.  The patient did not have access to video technology/had technical difficulties with video requiring transitioning to audio format only (telephone).  All issues noted in this document were discussed and addressed.  No physical exam could be performed with this format.  Please refer to the patient's chart for his  consent to telehealth for [facility].   I connected with  Jimmy [name] on 08/06/18 by a video enabled telemedicine application and verified that I am speaking with the correct person using two identifiers. I discussed the limitations of evaluation and management by telemedicine. The patient expressed understanding and agreed to proceed.   Evaluation Performed:  Follow-up visit  Date:  08/06/2018   ID:  Jimmy [name], DOB 01-22-1929, MRN [id number]  Patient Location:  [place] [address]   Provider location:   [facility], [place] office  PCP:  Ria Bush, MD  Cardiologist:  Patsy Baltimore   Chief Complaint:   Recent CVA, left side weakness   History of Present Illness:    Jimmy [name] is a 85 y.o. male who presents via audio/video conferencing for a telehealth visit today.   The patient does not symptoms concerning for COVID-19 infection (fever, chills, cough, or new SHORTNESS OF BREATH).   Patient has a past medical history of CLL (WBC ~ 50k), completed 6 rounds of treatment, stopped in December 2015. Smoked 28 yrs coronary artery disease, status post bypass  surgery in 2001 Myoview in September 2008,  ejection fraction of 63% with no ischemia or infarct Negative stress test August 2015 hyperlipidemia,  hypertension,  Carotid u/s 12/2015  40 to 59% disease on the right Less than 39% on the left minimal bilateral renal artery stenosis.   6 rounds of treatment of CLL, finished in December.  He presents for followup of his coronary artery disease  On follow-up today he reports having a CVA 04/2018 Acute right basal ganglia hemorrhage secondary to hypertensive crisis 04/04/2018 with sudden onset of left-sided weakness and dysarthria.  Etiology unclear.  Was on 2 Aggrenox per day prior to admission Cranial CT scan showed an 8.8 mL acute intraparenchymal hematoma centered at the posterior right lentiform nucleus without significant regional mass effect.  MRA negative.   Bilateral Carotid Dopplers -04/05/2018 Right Carotid: Velocities in the right ICA are consistent with a 40-59% stenosis. Left Carotid: Velocities in the left ICA are consistent with a 1-39% stenosis. Vertebrals: Bilateral vertebral arteries demonstrate antegrade flow.  On discussion today, he reports consistent left-sided deficits Left hand "useless" Left leg can kick "walks pretty good", still slowly improving Pain in left thigh when standing.  Etiology unclear  BP 118/61 Pulse stable Weight  Down 30 pounds 145 pounds Missed lots of meals, less of an appetite Also has been very strict with his diet  No chest pain, no shortness of breath on exertion  Lipid panel reviewed with him in detail Total chol 121 LDL 40  Other past medical hx reviewed Jan 2018, possible TIA visual problem, Speech issue He  was not given TPA MRI documented stroke, old D/c on 04/09/2016  MRA: Brain  2018 Chronic microvascular ischemia and old left basal ganglia lacunar infarct.  completed 6 rounds of treatment, stopped in December 2015.  Previous echocardiogram showing normal ejection  fraction, mild aortic valve stenosis   Prior CV studies:   The following studies were reviewed today:   Past Medical History:  Diagnosis Date  . CAD (coronary artery disease)    a. 04/2001 S/P CABGx 4;  b. 2008 MV:  EF 63% normal perfusion.  . Carotid stenosis    a. 11/2012 Carotid U/S:  RICA 57-32%, LICA 20-25%.  . Chronic kidney disease, stage 3 ([place])   . CLL (chronic lymphoblastic leukemia) 2009   Stage IV; Dr. Ivor Messier - referred to Dr. Lissa Merlin [facility] 07/2013 now on Rituxan (10/2013) --> stage 0 (09/2014)  . Diastolic CHF ([place])    a. 06/2704 Echo: EF 55-65%, mild conc LVH, Gr 1 DD, mild AS, triv AI, mildly dil Ao root (3.5 cm).  . GERD (gastroesophageal reflux disease) 1990s  . History of CVA (cerebrovascular accident) 2015   by MRI - remote L internal capsule  . History of herpes genitalis    valtrex daily  . Hyperlipemia 2002  . Hypertension 2002  . Mass of submandibular region 2015   referred to gen surg after chemo  . Mild aortic stenosis    a. 07/2011 Echo: Mild AS, triv AI.  [name] Stroke ([place])   . Thrombocytopenia ([place])    outpatient goals Hgb >9, plt >20  . TIA (transient ischemic attack)    04/2016   Past Surgical History:  Procedure Laterality Date  . ANGIOPLASTY  1998  . CATARACT EXTRACTION, BILATERAL     R 1/09, L 8/09  . COLONOSCOPY  11/29/1987   Normal  . COLONOSCOPY  02/07/2003   Hemm. Internal, focal proctitis, negative biopsy  . COLONOSCOPY  12/12/2004   Internal external hemorrhoids, +proctits, negative biopsy  . CORONARY ANGIOPLASTY  1998  . CORONARY ARTERY BYPASS GRAFT  04/23/2001   x4, Dr. Pia Mau  . ESOPHAGOGASTRODUODENOSCOPY  11/29/1987   Gastritis  . ETT  12/10/2006   Persantine myoview nml  . HEMORROIDECTOMY  1954   Fissure repair, [place]  . HEPATIC ARTERY ANGIOPLASTY  1954   [place], [place]  . KIDNEY STONE SURGERY  1977   Dr Redmond Baseman  . LITHOTRIPSY  1990s   Multiple  . US ECHOCARDIOGRAPHY  07/2011   nl sys fxn, EF 55-60%, grade 1 diast  dysfunction, mild AS, mildly elevated PA pressure     No outpatient medications have been marked as taking for the 08/06/18 encounter (Telemedicine) with Minna Merritts, MD.     Allergies:   New skin and Tape   Social History   Tobacco Use  . Smoking status: Former Smoker    Packs/day: 2.00    Years: 30.00    Pack years: 60.00    Last attempt to quit: 02/28/1979    Years since quitting: 39.4  . Smokeless tobacco: Never Used  . Tobacco comment: quit 1980  Substance Use Topics  . Alcohol use: No    Alcohol/week: 0.0 standard drinks    Comment: occasional wine  . Drug use: Never     Current Outpatient Medications on File Prior to Visit  Medication Sig Dispense Refill  . acetaminophen (TYLENOL) 325 MG tablet Take 2 tablets (650 mg total) by mouth every 4 (four) hours as needed for mild pain (or temp >  37.5 C (99.5 F)).    [name] allopurinol (ZYLOPRIM) 300 MG tablet Take 1 tablet (300 mg total) by mouth daily. 90 tablet 1  . aspirin 81 MG tablet Take 1 tablet (81 mg total) by mouth daily.    . cholecalciferol (VITAMIN D) 1000 units tablet Take 2,000 Units by mouth daily.    [name] lisinopril (PRINIVIL,ZESTRIL) 5 MG tablet Take 1 tablet (5 mg total) by mouth daily.    . nitroGLYCERIN (NITROSTAT) 0.4 MG SL tablet Place 1 tablet (0.4 mg total) under the tongue every 5 (five) minutes as needed for chest pain (max 3 doses in 15 minutes). 25 tablet 3  . polyethylene glycol powder (GLYCOLAX/MIRALAX) powder Take 17 g by mouth daily.    . simvastatin (ZOCOR) 10 MG tablet TAKE 1 TABLET AT BEDTIME (Patient taking differently: Take 10 mg by mouth daily. ) 90 tablet 3  . valACYclovir (VALTREX) 500 MG tablet Take 1 tablet (500 mg total) by mouth daily. 90 tablet 1  . vitamin B-12 (CYANOCOBALAMIN) 1000 MCG tablet Take 1 tablet (1,000 mcg total) by mouth daily.     No current facility-administered medications on file prior to visit.      Family Hx: The patient's family history includes Alcohol abuse in  his brother; Depression in his daughter; Diabetes in his brother and another family member; Heart attack in his brother; Heart disease in his mother; Hyperlipidemia in his father and mother; Hypertension in his father and mother; Kidney cancer in his sister; Stroke in his brother.  ROS:   Please see the history of present illness.    Review of Systems  Constitutional: Negative.   HENT: Negative.   Respiratory: Negative.   Cardiovascular: Negative.   Gastrointestinal: Negative.   Musculoskeletal: Negative.   Neurological: Negative.   Psychiatric/Behavioral: Negative.   All other systems reviewed and are negative.    Labs/Other Tests and Data Reviewed:    Recent Labs: 04/08/2018: ALT 17 06/16/2018: BUN 22; Creatinine, Ser 1.23; Hemoglobin 15.4; Platelets 170.0; Potassium 4.5; Sodium 138   Recent Lipid Panel Lab Results  Component Value Date/Time   CHOL 119 06/16/2018 01:11 PM   TRIG 191.0 (H) 06/16/2018 01:11 PM   HDL 35.80 (L) 06/16/2018 01:11 PM   CHOLHDL 3 06/16/2018 01:11 PM   LDLCALC 45 06/16/2018 01:11 PM    Wt Readings from Last 3 Encounters:  06/17/18 158 lb 12.8 oz (72 kg)  06/16/18 157 lb 9 oz (71.5 kg)  06/10/18 159 lb 3.2 oz (72.2 kg)     Exam:    Vital Signs: Vital signs may also be detailed in the HPI There were no vitals taken for this visit.  Wt Readings from Last 3 Encounters:  06/17/18 158 lb 12.8 oz (72 kg)  06/16/18 157 lb 9 oz (71.5 kg)  06/10/18 159 lb 3.2 oz (72.2 kg)   Temp Readings from Last 3 Encounters:  06/16/18 97.6 F (36.4 C) (Oral)  04/29/18 97.6 F (36.4 C) (Oral)  04/07/18 98.2 F (36.8 C) (Oral)   BP Readings from Last 3 Encounters:  06/17/18 124/69  06/16/18 120/66  06/10/18 (!) 98/56   Pulse Readings from Last 3 Encounters:  06/17/18 68  06/16/18 90  06/10/18 96    BP 118/61 Pulse 70s Weight  Down 30 pounds 145 pounds Abrasions 16  Well nourished, well developed male in no acute distress. Constitutional:  oriented  to person, place, and time. No distress.     ASSESSMENT & PLAN:    PAD (peripheral  artery disease) ([place]) Lipid panel at goal, Carotid stenosis stable  Atherosclerosis of coronary artery bypass graft of native heart with stable angina pectoris ([place]) Currently with no symptoms of angina. No further workup at this time. Continue current medication regimen.  Essential hypertension Blood pressure is well controlled on today's visit. No changes made to the medications.  Mixed hyperlipidemia Cholesterol is at goal on the current lipid regimen. No changes to the medications were made.  Chronic lymphocytic leukemia, Rai stage 0 ([place]) Prior chemotherapy Followed by hematology  Stenosis of carotid artery, unspecified laterality Stable disease, no further work-up needed  Cerebrovascular accident (CVA) due to other mechanism [facility]) Prior stroke 2018 with recurrent stroke January 2020 Appears to be hemorrhagic, etiology unclear Was on Aggrenox 2 a day now on low-dose aspirin daily Followed by neurology, working with PT, having good recovery still with left hand left arm deficits, left leg doing better    COVID-19 Education: The signs and symptoms of COVID-19 were discussed with the patient and how to seek care for testing (follow up with PCP or arrange E-visit).  The importance of social distancing was discussed today.  Patient Risk:   After full review of this patients clinical status, I feel that they are at least moderate risk at this time.  Time:   Today, I have spent 25 minutes with the patient with telehealth technology discussing the cardiac and medical problems/diagnoses detailed above   10 min spent reviewing the chart prior to patient visit today   Medication Adjustments/Labs and Tests Ordered: Current medicines are reviewed at length with the patient today.  Concerns regarding medicines are outlined above.   Tests Ordered: No tests ordered   Medication Changes: No  changes made   Disposition: Follow-up in 6 months   Signed, Ida Rogue, MD  08/06/2018 12:23 PM    [place] Office [id number], [place], [place] [address]

## 2018-08-13 ENCOUNTER — Encounter: Payer: Self-pay | Admitting: Physical Medicine & Rehabilitation

## 2018-08-13 ENCOUNTER — Encounter: Payer: Medicare Other | Attending: Physical Medicine & Rehabilitation | Admitting: Physical Medicine & Rehabilitation

## 2018-08-13 ENCOUNTER — Other Ambulatory Visit: Payer: Self-pay

## 2018-08-13 VITALS — BP 91/56 | HR 110 | Temp 98.6°F | Ht 69.5 in | Wt 150.0 lb

## 2018-08-13 DIAGNOSIS — I6529 Occlusion and stenosis of unspecified carotid artery: Secondary | ICD-10-CM

## 2018-08-13 DIAGNOSIS — I69398 Other sequelae of cerebral infarction: Secondary | ICD-10-CM | POA: Diagnosis not present

## 2018-08-13 DIAGNOSIS — M24542 Contracture, left hand: Secondary | ICD-10-CM

## 2018-08-13 DIAGNOSIS — I610 Nontraumatic intracerebral hemorrhage in hemisphere, subcortical: Secondary | ICD-10-CM | POA: Diagnosis not present

## 2018-08-13 DIAGNOSIS — I69954 Hemiplegia and hemiparesis following unspecified cerebrovascular disease affecting left non-dominant side: Secondary | ICD-10-CM | POA: Diagnosis not present

## 2018-08-13 DIAGNOSIS — R269 Unspecified abnormalities of gait and mobility: Secondary | ICD-10-CM | POA: Diagnosis not present

## 2018-08-13 NOTE — Patient Instructions (Signed)
Practice 3 sets of 10 repetitiion sit to stand   Place left hand in warm  (not hot) water for ~49minutes Then push Left hand into a fist and hot for 1 minute  Place hand in warm water for 5 min Then straighten Left fingers x 1 minute

## 2018-08-13 NOTE — Progress Notes (Signed)
Subjective:  85 year old right-handed male with history of CAD with CABG, CLL, CKD stage III, diastolic congestive heart failure, hypertension, CVA 2 years ago. Initially maintained on Aggrenox. He lives with spouse. Presented  04/04/2018 with sudden onset of left-sided weakness and dysarthria. Blood pressure 152/75. Cranial CT scan showed an 8.8 mL acute intraparenchymal hematoma centered at the posterior right lentiform nucleus without significant regional mass effect. MRA negative. Initially placed on Cardene drip for blood pressure control. Mechanical soft diet with nectar thick liquids. HPI Back at home since D/C 1/29- to SNF until 2/14 Now Receiving home health therapy PT, OT, [facility] this am during dressing, no injury and fell during shower no injury (prior to grab bars being installed) [facility] 2/17 , wife called EMT   [name], male    DOB: 09/29/1928, 85 y.o.   MRN: [id number]  HPI Finished with home health therapy No outpt therapy, [facility] closed due to COVID-19, set to reopen June 1  Pt fell while pushing WC, bruised Left knee No other injuries.  His wife and he will walk outside down the street and when he gets tired she will push him in the wheelchair.  He does not take his walker on these walks but instead pushes the wheelchair.  We discussed that the wheelchair has no breaks and that it is less stable than the Rollator walker   On low dose ASA for CVA/CAD prophyllaxis  In WC or amb with rollator in the home Pain Inventory Average Pain 0 Pain Right Now 0 My pain is na  In the last 24 hours, has pain interfered with the following? General activity 0 Relation with others 0 Enjoyment of life 0 What TIME of day is your pain at its worst? na Sleep (in general) Fair  Pain is worse with: inactivity Pain improves with: na Relief from Meds: na  Mobility use a walker ability to climb steps?  no do you drive?  no use a  wheelchair  Function retired  Neuro/Psych No problems in this area  Prior Studies Any changes since last visit?  no  Physicians involved in your care Any changes since last visit?  no   Family History  Problem Relation Age of Onset  . Hypertension Mother   . Heart disease Mother   . Hyperlipidemia Mother   . Hypertension Father   . Hyperlipidemia Father   . Kidney cancer Sister        Renal cell cancer  . Alcohol abuse Brother   . Diabetes Brother   . Stroke Brother   . Heart attack Brother        MI  . Diabetes Other        Sister's daughter  . Depression Daughter        Bipolar   Social History   Socioeconomic History  . Marital status: Married    Spouse name: Not on file  . Number of children: Not on file  . Years of education: Not on file  . Highest education level: Not on file  Occupational History  . Occupation: Retired Information systems manager: RETIRED  Social Needs  . Financial resource strain: Not on file  . Food insecurity:    Worry: Not on file    Inability: Not on file  . Transportation needs:    Medical: Not on file    Non-medical: Not on file  Tobacco Use  . Smoking status: Former Smoker  Packs/day: 2.00    Years: 30.00    Pack years: 60.00    Last attempt to quit: 02/28/1979    Years since quitting: 39.4  . Smokeless tobacco: Never Used  . Tobacco comment: quit 1980  Substance and Sexual Activity  . Alcohol use: No    Alcohol/week: 0.0 standard drinks    Comment: occasional wine  . Drug use: Never  . Sexual activity: Yes  Lifestyle  . Physical activity:    Days per week: Not on file    Minutes per session: Not on file  . Stress: Not on file  Relationships  . Social connections:    Talks on phone: Not on file    Gets together: Not on file    Attends religious service: Not on file    Active member of club or organization: Not on file    Attends meetings of clubs or organizations: Not on file    Relationship status: Not on  file  Other Topics Concern  . Not on file  Social History Narrative   Married and lives with wife   1 son died of lung cancer at 66 years old   42 daughter bipolar, lives    Activity: walks 1 mi daily on treadmill    Diet: good water, some fruits/vegetables    Past Surgical History:  Procedure Laterality Date  . ANGIOPLASTY  1998  . CATARACT EXTRACTION, BILATERAL     R 1/09, L 8/09  . COLONOSCOPY  11/29/1987   Normal  . COLONOSCOPY  02/07/2003   Hemm. Internal, focal proctitis, negative biopsy  . COLONOSCOPY  12/12/2004   Internal external hemorrhoids, +proctits, negative biopsy  . CORONARY ANGIOPLASTY  1998  . CORONARY ARTERY BYPASS GRAFT  04/23/2001   x4, Dr. Pia Mau  . ESOPHAGOGASTRODUODENOSCOPY  11/29/1987   Gastritis  . ETT  12/10/2006   Persantine myoview nml  . HEMORROIDECTOMY  1954   Fissure repair, [place]  . HEPATIC ARTERY ANGIOPLASTY  1954   [place], [place]  . KIDNEY STONE SURGERY  1977   Dr Redmond Baseman  . LITHOTRIPSY  1990s   Multiple  . US ECHOCARDIOGRAPHY  07/2011   nl sys fxn, EF 55-60%, grade 1 diast dysfunction, mild AS, mildly elevated PA pressure   Past Medical History:  Diagnosis Date  . CAD (coronary artery disease)    a. 04/2001 S/P CABGx 4;  b. 2008 MV:  EF 63% normal perfusion.  . Carotid stenosis    a. 11/2012 Carotid U/S:  RICA 18-29%, LICA 93-71%.  . Chronic kidney disease, stage 3 ([place])   . CLL (chronic lymphoblastic leukemia) 2009   Stage IV; Dr. Ivor Messier - referred to Dr. Lissa Merlin [facility] 07/2013 now on Rituxan (10/2013) --> stage 0 (09/2014)  . Diastolic CHF ([place])    a. 08/9676 Echo: EF 55-65%, mild conc LVH, Gr 1 DD, mild AS, triv AI, mildly dil Ao root (3.5 cm).  . GERD (gastroesophageal reflux disease) 1990s  . History of CVA (cerebrovascular accident) 2015   by MRI - remote L internal capsule  . History of herpes genitalis    valtrex daily  . Hyperlipemia 2002  . Hypertension 2002  . Mass of submandibular region 2015   referred to gen surg  after chemo  . Mild aortic stenosis    a. 07/2011 Echo: Mild AS, triv AI.  [name] Stroke ([place])   . Thrombocytopenia ([place])    outpatient goals Hgb >9, plt >20  . TIA (transient ischemic attack)  04/2016   BP (!) 91/56   Pulse (!) 110   Temp 98.6 F (37 C)   Ht 5' 9.5" (1.765 m)   Wt 150 lb (68 kg)   SpO2 96%   BMI 21.83 kg/m   Opioid Risk Score:   Fall Risk Score:  `1  Depression screen PHQ 2/9  Depression screen [facility] 2/9 06/17/2018 05/29/2018 10/21/2017 10/11/2016 09/20/2015 09/16/2014 08/30/2013  Decreased Interest 0 0 0 0 0 0 0  Down, Depressed, Hopeless 0 0 0 0 0 0 0  PHQ - 2 Score 0 0 0 0 0 0 0  Altered sleeping - - 0 - - - -  Tired, decreased energy - - 0 - - - -  Change in appetite - - 0 - - - -  Feeling bad or failure about yourself  - - 0 - - - -  Trouble concentrating - - 0 - - - -  Moving slowly or fidgety/restless - - 0 - - - -  Suicidal thoughts - - 0 - - - -  PHQ-9 Score - - 0 - - - -  Difficult doing work/chores - - Not difficult at all - - - -  Some recent data might be hidden     Review of Systems  Constitutional: Negative.   HENT: Negative.   Eyes: Negative.   Respiratory: Negative.   Cardiovascular: Negative.   Gastrointestinal: Negative.   Endocrine: Negative.   Genitourinary: Negative.   Musculoskeletal: Positive for gait problem.  Skin: Negative.   Allergic/Immunologic: Negative.   Hematological: Negative.   Psychiatric/Behavioral: Negative.   All other systems reviewed and are negative.      Objective:   Physical Exam Vitals signs and nursing note reviewed.  Constitutional:      Appearance: Normal appearance.  HENT:     Head: Normocephalic and atraumatic.     Nose: Nose normal.     Mouth/Throat:     Mouth: Mucous membranes are moist.  Eyes:     General: No visual field deficit.    Extraocular Movements: Extraocular movements intact.     Conjunctiva/sclera: Conjunctivae normal.     Pupils: Pupils are equal, round, and reactive to light.   Cardiovascular:     Rate and Rhythm: Normal rate and regular rhythm.     Heart sounds: Normal heart sounds. No murmur.  Pulmonary:     Effort: Pulmonary effort is normal. No respiratory distress.     Breath sounds: Normal breath sounds. No stridor. No wheezing.  Abdominal:     General: Abdomen is flat. Bowel sounds are normal. There is no distension.     Palpations: Abdomen is soft. There is no mass.     Tenderness: There is no abdominal tenderness.  Musculoskeletal: Normal range of motion.        General: No tenderness.  Skin:    General: Skin is warm and dry.     Coloration: Skin is not jaundiced.  Neurological:     Mental Status: He is alert and oriented to person, place, and time.     Cranial Nerves: No facial asymmetry.     Sensory: No sensory deficit.     Gait: Gait abnormal.     Comments: Motor strength is 5/5 in the right deltoid bicep tricep grip hip flexor knee extensor ankle dorsiflexor 4/5 in left deltoid bicep tricep 3- in the finger flexors and extensors limited by range of motion at the PIP and DIPs of the second third fourth and  fifth digits of the left hand. 4/5 in left hip flexor knee extensor ankle dorsiflexor Sensation normal to light touch bilaterally.  Gait exhibits truncal ataxia, broad-based gait with loss of balance with turns this is with close standby assist with min contact-guard There is also mild dysmetria left finger-nose-finger as well as left heel-to-shin  Psychiatric:        Mood and Affect: Mood normal.        Behavior: Behavior normal.        Thought Content: Thought content normal.        Judgment: Judgment normal.     Left hand contracture digits 2345 at the MCP PIP DIP he is able to achieve approximately 45 degrees of flexion at each of these joints he is -10 degrees from extension in these joints as well. There is no evidence of clonus in the wrist flexor or finger flexor muscles.     Assessment & Plan:  1.  Left hemiparesis and gait  disorder secondary to right basal ganglia hemorrhage, overall making a good recovery.  We discussed with his wife that he should not be ambulating while pushing wheelchair.  He will need his Rollator because it has brakes.  He still has quite a bit of truncal instability. I would recommend outpatient PT to work on gait and he plans to start this in a couple weeks. 2.  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  This is affecting digits 2345.  We discussed soaking his hand in warm water for about 5 minutes then closing his fist and using his right hand to provide some passive finger flexion as well hold it for 1 minute.  Then immerse hand into warm water for 5 more minutes and practice finger extension using the right hand to straighten the digits This does not appear to be muscle spasticity and therefore do not think he would benefit from botulinum toxin.  I will see him back in approximately 6 weeks to follow-up on his outpatient therapy.

## 2018-08-27 ENCOUNTER — Inpatient Hospital Stay: Payer: Medicare Other

## 2018-08-27 ENCOUNTER — Encounter: Payer: Self-pay | Admitting: Emergency Medicine

## 2018-08-27 ENCOUNTER — Other Ambulatory Visit: Payer: Self-pay

## 2018-08-27 ENCOUNTER — Emergency Department: Payer: Medicare Other

## 2018-08-27 ENCOUNTER — Inpatient Hospital Stay
Admission: EM | Admit: 2018-08-27 | Discharge: 2018-08-28 | DRG: 064 | Disposition: A | Discharge status: Home Health | Payer: Medicare Other | Attending: Family Medicine | Admitting: Family Medicine

## 2018-08-27 DIAGNOSIS — D696 Thrombocytopenia, unspecified: Secondary | ICD-10-CM | POA: Diagnosis present

## 2018-08-27 DIAGNOSIS — R4781 Slurred speech: Secondary | ICD-10-CM

## 2018-08-27 DIAGNOSIS — Z8249 Family history of ischemic heart disease and other diseases of the circulatory system: Secondary | ICD-10-CM

## 2018-08-27 DIAGNOSIS — I10 Essential (primary) hypertension: Secondary | ICD-10-CM | POA: Diagnosis not present

## 2018-08-27 DIAGNOSIS — R531 Weakness: Secondary | ICD-10-CM | POA: Diagnosis not present

## 2018-08-27 DIAGNOSIS — Z03818 Encounter for observation for suspected exposure to other biological agents ruled out: Secondary | ICD-10-CM | POA: Diagnosis not present

## 2018-08-27 DIAGNOSIS — Z823 Family history of stroke: Secondary | ICD-10-CM | POA: Diagnosis not present

## 2018-08-27 DIAGNOSIS — N183 Chronic kidney disease, stage 3 (moderate): Secondary | ICD-10-CM | POA: Diagnosis present

## 2018-08-27 DIAGNOSIS — I5033 Acute on chronic diastolic (congestive) heart failure: Secondary | ICD-10-CM | POA: Diagnosis present

## 2018-08-27 DIAGNOSIS — R29705 NIHSS score 5: Secondary | ICD-10-CM | POA: Diagnosis present

## 2018-08-27 DIAGNOSIS — Z79899 Other long term (current) drug therapy: Secondary | ICD-10-CM

## 2018-08-27 DIAGNOSIS — I130 Hypertensive heart and chronic kidney disease with heart failure and stage 1 through stage 4 chronic kidney disease, or unspecified chronic kidney disease: Secondary | ICD-10-CM | POA: Diagnosis present

## 2018-08-27 DIAGNOSIS — K219 Gastro-esophageal reflux disease without esophagitis: Secondary | ICD-10-CM | POA: Diagnosis present

## 2018-08-27 DIAGNOSIS — Z7982 Long term (current) use of aspirin: Secondary | ICD-10-CM | POA: Diagnosis not present

## 2018-08-27 DIAGNOSIS — Z833 Family history of diabetes mellitus: Secondary | ICD-10-CM | POA: Diagnosis not present

## 2018-08-27 DIAGNOSIS — I371 Nonrheumatic pulmonary valve insufficiency: Secondary | ICD-10-CM | POA: Diagnosis not present

## 2018-08-27 DIAGNOSIS — Z1159 Encounter for screening for other viral diseases: Secondary | ICD-10-CM

## 2018-08-27 DIAGNOSIS — R4701 Aphasia: Secondary | ICD-10-CM | POA: Diagnosis present

## 2018-08-27 DIAGNOSIS — Z8051 Family history of malignant neoplasm of kidney: Secondary | ICD-10-CM

## 2018-08-27 DIAGNOSIS — Z87891 Personal history of nicotine dependence: Secondary | ICD-10-CM

## 2018-08-27 DIAGNOSIS — Z811 Family history of alcohol abuse and dependence: Secondary | ICD-10-CM

## 2018-08-27 DIAGNOSIS — Z8349 Family history of other endocrine, nutritional and metabolic diseases: Secondary | ICD-10-CM | POA: Diagnosis not present

## 2018-08-27 DIAGNOSIS — I639 Cerebral infarction, unspecified: Secondary | ICD-10-CM | POA: Diagnosis not present

## 2018-08-27 DIAGNOSIS — I6523 Occlusion and stenosis of bilateral carotid arteries: Secondary | ICD-10-CM | POA: Diagnosis not present

## 2018-08-27 DIAGNOSIS — I5032 Chronic diastolic (congestive) heart failure: Secondary | ICD-10-CM | POA: Diagnosis not present

## 2018-08-27 DIAGNOSIS — I2510 Atherosclerotic heart disease of native coronary artery without angina pectoris: Secondary | ICD-10-CM | POA: Diagnosis present

## 2018-08-27 DIAGNOSIS — E785 Hyperlipidemia, unspecified: Secondary | ICD-10-CM | POA: Diagnosis present

## 2018-08-27 DIAGNOSIS — I351 Nonrheumatic aortic (valve) insufficiency: Secondary | ICD-10-CM | POA: Diagnosis not present

## 2018-08-27 LAB — CBC
HCT: 44.5 % (ref 39.0–52.0)
Hemoglobin: 15 g/dL (ref 13.0–17.0)
MCH: 31.6 pg (ref 26.0–34.0)
MCHC: 33.7 g/dL (ref 30.0–36.0)
MCV: 93.9 fL (ref 80.0–100.0)
Platelets: 156 10*3/uL (ref 150–400)
RBC: 4.74 MIL/uL (ref 4.22–5.81)
RDW: 14.2 % (ref 11.5–15.5)
WBC: 7.2 10*3/uL (ref 4.0–10.5)
nRBC: 0 % (ref 0.0–0.2)

## 2018-08-27 LAB — COMPREHENSIVE METABOLIC PANEL
ALT: 27 U/L (ref 0–44)
AST: 24 U/L (ref 15–41)
Albumin: 4.2 g/dL (ref 3.5–5.0)
Alkaline Phosphatase: 110 U/L (ref 38–126)
Anion gap: 8 (ref 5–15)
BUN: 31 mg/dL — ABNORMAL HIGH (ref 8–23)
CO2: 29 mmol/L (ref 22–32)
Calcium: 9.8 mg/dL (ref 8.9–10.3)
Chloride: 105 mmol/L (ref 98–111)
Creatinine, Ser: 1.25 mg/dL — ABNORMAL HIGH (ref 0.61–1.24)
GFR calc Af Amer: 60 mL/min — ABNORMAL LOW (ref >60)
GFR calc non Af Amer: 51 mL/min — ABNORMAL LOW (ref >60)
Glucose, Bld: 130 mg/dL — ABNORMAL HIGH (ref 70–99)
Potassium: 3.8 mmol/L (ref 3.5–5.1)
Sodium: 142 mmol/L (ref 135–145)
Total Bilirubin: 0.7 mg/dL (ref 0.3–1.2)
Total Protein: 7.3 g/dL (ref 6.5–8.1)

## 2018-08-27 LAB — DIFFERENTIAL
Abs Immature Granulocytes: 0.02 10*3/uL (ref 0.00–0.07)
Basophils Absolute: 0 10*3/uL (ref 0.0–0.1)
Basophils Relative: 0 %
Eosinophils Absolute: 0.2 10*3/uL (ref 0.0–0.5)
Eosinophils Relative: 3 %
Immature Granulocytes: 0 %
Lymphocytes Relative: 18 %
Lymphs Abs: 1.3 10*3/uL (ref 0.7–4.0)
Monocytes Absolute: 0.5 10*3/uL (ref 0.1–1.0)
Monocytes Relative: 7 %
Neutro Abs: 5.1 10*3/uL (ref 1.7–7.7)
Neutrophils Relative %: 72 %

## 2018-08-27 LAB — APTT: aPTT: 31 seconds (ref 24–36)

## 2018-08-27 LAB — SARS CORONAVIRUS 2 BY RT PCR (HOSPITAL ORDER, PERFORMED IN ~~LOC~~ HOSPITAL LAB): SARS Coronavirus 2: NEGATIVE

## 2018-08-27 LAB — PROTIME-INR
INR: 1 (ref 0.8–1.2)
Prothrombin Time: 13.3 seconds (ref 11.4–15.2)

## 2018-08-27 MED ORDER — ASPIRIN EC 81 MG PO TBEC: 81.0000 mg | DELAYED_RELEASE_TABLET | Freq: Every day | ORAL | Status: DC

## 2018-08-27 MED ORDER — ACETAMINOPHEN 650 MG RE SUPP: 650.0000 mg | RECTAL | Status: DC | PRN

## 2018-08-27 MED ORDER — STROKE: EARLY STAGES OF RECOVERY BOOK
Freq: Once | Status: AC
  Administered 2018-08-27: 21:00:00

## 2018-08-27 MED ORDER — ACETAMINOPHEN 325 MG PO TABS: 650.0000 mg | ORAL_TABLET | ORAL | Status: DC | PRN

## 2018-08-27 MED ORDER — SENNOSIDES-DOCUSATE SODIUM 8.6-50 MG PO TABS: 1.0000 | ORAL_TABLET | Freq: Every evening | ORAL | Status: DC | PRN

## 2018-08-27 MED ORDER — SODIUM CHLORIDE 0.9 % IV SOLN
INTRAVENOUS | Status: DC
  Administered 2018-08-27: via INTRAVENOUS

## 2018-08-27 MED ORDER — LISINOPRIL 5 MG PO TABS: 5.0000 mg | ORAL_TABLET | Freq: Every day | ORAL | Status: DC

## 2018-08-27 MED ORDER — SODIUM CHLORIDE 0.9% FLUSH: 3.0000 mL | Freq: Once | INTRAVENOUS | Status: DC

## 2018-08-27 MED ORDER — ACETAMINOPHEN 160 MG/5ML PO SOLN
650.0000 mg | ORAL | Status: DC | PRN
  Filled 2018-08-27: qty 20.3

## 2018-08-27 MED ORDER — VALACYCLOVIR HCL 500 MG PO TABS
500.0000 mg | ORAL_TABLET | Freq: Every day | ORAL | Status: DC | PRN
  Filled 2018-08-27: qty 1

## 2018-08-27 MED ORDER — SIMVASTATIN 20 MG PO TABS: 10.0000 mg | ORAL_TABLET | Freq: Every day | ORAL | Status: DC

## 2018-08-27 MED ORDER — ENOXAPARIN SODIUM 40 MG/0.4ML ~~LOC~~ SOLN
40.0000 mg | SUBCUTANEOUS | Status: DC
  Administered 2018-08-27: 40 mg via SUBCUTANEOUS
  Filled 2018-08-27: qty 0.4

## 2018-08-27 MED ORDER — VITAMIN D3 25 MCG (1000 UNIT) PO TABS
2000.0000 [IU] | ORAL_TABLET | Freq: Every day | ORAL | Status: DC
  Filled 2018-08-27: qty 2

## 2018-08-27 MED ORDER — ALLOPURINOL 100 MG PO TABS: 300.0000 mg | ORAL_TABLET | Freq: Every day | ORAL | Status: DC

## 2018-08-27 MED ORDER — VITAMIN B-12 1000 MCG PO TABS: 1000.0000 ug | ORAL_TABLET | Freq: Every day | ORAL | Status: DC

## 2018-08-27 NOTE — ED Triage Notes (Addendum)
Pt has had slurred speech X 2 days. Notably slurred speech in triage.  Hemorrhagic stroke/ICH this year in in January.  Pt reports his wife thinks he had a TIA.  Left side weakness from previous stroke.  Pt is oriented X 3.  Pt fell 2 weeks ago and he reports it was mechanical, wife reports he seemed to collapse.

## 2018-08-27 NOTE — ED Notes (Signed)
ED TO INPATIENT HANDOFF REPORT  ED Nurse Name and Phone #: Anda Kraft [phone]  S Name/Age/Gender Jimmy [name] 85 y.o. male Room/Bed: ED18A/ED18A  Code Status   Code Status: Prior  Home/SNF/Other Home Patient oriented to: self, place, time and situation Is this baseline? Yes   Triage Complete: Triage complete  Chief Complaint ams  Triage Note Pt has had slurred speech X 2 days. Notably slurred speech in triage.  Hemorrhagic stroke/ICH this year in in January.  Pt reports his wife thinks he had a TIA.  Left side weakness from previous stroke.  Pt is oriented X 3.  Pt fell 2 weeks ago and he reports it was mechanical, wife reports he seemed to collapse.   Allergies Allergies  Allergen Reactions  . New Skin Other (See Comments)    unknown  . Tape Dermatitis    Level of Care/Admitting Diagnosis ED Disposition    ED Disposition Condition [facility] Area: [place] [[id number]]  Level of Care: Med-Surg [16]  Covid Evaluation: Confirmed COVID Negative  Diagnosis: Slurred speech [[id number]]  Admitting Physician: Hyman Bible DODD [[id number]]  Attending Physician: Hyman Bible DODD [[id number]]  Estimated length of stay: past midnight tomorrow  Certification:: I certify this patient will need inpatient services for at least 2 midnights  PT Class (Do Not Modify): Inpatient [101]  PT Acc Code (Do Not Modify): Private [1]       B Medical/Surgery History Past Medical History:  Diagnosis Date  . CAD (coronary artery disease)    a. 04/2001 S/P CABGx 4;  b. 2008 MV:  EF 63% normal perfusion.  . Carotid stenosis    a. 11/2012 Carotid U/S:  RICA 15-17%, LICA 61-60%.  . Chronic kidney disease, stage 3 ([place])   . CLL (chronic lymphoblastic leukemia) 2009   Stage IV; Dr. Ivor Messier - referred to Dr. Lissa Merlin [facility] 07/2013 now on Rituxan (10/2013) --> stage 0 (09/2014)  . Diastolic CHF ([place])    a. 09/3708 Echo: EF 55-65%, mild conc LVH, Gr 1 DD, mild AS, triv AI,  mildly dil Ao root (3.5 cm).  . GERD (gastroesophageal reflux disease) 1990s  . History of CVA (cerebrovascular accident) 2015   by MRI - remote L internal capsule  . History of herpes genitalis    valtrex daily  . Hyperlipemia 2002  . Hypertension 2002  . Mass of submandibular region 2015   referred to gen surg after chemo  . Mild aortic stenosis    a. 07/2011 Echo: Mild AS, triv AI.  [name] Stroke ([place])   . Thrombocytopenia ([place])    outpatient goals Hgb >9, plt >20  . TIA (transient ischemic attack)    04/2016   Past Surgical History:  Procedure Laterality Date  . ANGIOPLASTY  1998  . CATARACT EXTRACTION, BILATERAL     R 1/09, L 8/09  . COLONOSCOPY  11/29/1987   Normal  . COLONOSCOPY  02/07/2003   Hemm. Internal, focal proctitis, negative biopsy  . COLONOSCOPY  12/12/2004   Internal external hemorrhoids, +proctits, negative biopsy  . CORONARY ANGIOPLASTY  1998  . CORONARY ARTERY BYPASS GRAFT  04/23/2001   x4, Dr. Pia Mau  . ESOPHAGOGASTRODUODENOSCOPY  11/29/1987   Gastritis  . ETT  12/10/2006   Persantine myoview nml  . HEMORROIDECTOMY  1954   Fissure repair, [place]  . HEPATIC ARTERY ANGIOPLASTY  1954   [place], [place]  . KIDNEY STONE SURGERY  1977   Dr Redmond Baseman  . LITHOTRIPSY  1990s   Multiple  . US ECHOCARDIOGRAPHY  07/2011   nl sys fxn, EF 55-60%, grade 1 diast dysfunction, mild AS, mildly elevated PA pressure     A IV Location/Drains/Wounds Patient Lines/Drains/Airways Status   Active Line/Drains/Airways    Name:   Placement date:   Placement time:   Site:   Days:   Peripheral IV 08/27/18 Left Forearm   08/27/18    1715    Forearm   less than 1          Intake/Output Last 24 hours No intake or output data in the 24 hours ending 08/27/18 1941  Labs/Imaging Results for orders placed or performed during the hospital encounter of 08/27/18 (from the past 48 hour(s))  Protime-INR     Status: None   Collection Time: 08/27/18  2:36 PM  Result Value Ref Range    Prothrombin Time 13.3 11.4 - 15.2 seconds   INR 1.0 0.8 - 1.2    Comment: (NOTE) INR goal varies based on device and disease states. Performed at [facility], [address] [place], [place] [address]   APTT     Status: None   Collection Time: 08/27/18  2:36 PM  Result Value Ref Range   aPTT 31 24 - 36 seconds    Comment: Performed at [facility], [address] [place], [place] [address]  CBC     Status: None   Collection Time: 08/27/18  2:36 PM  Result Value Ref Range   WBC 7.2 4.0 - 10.5 K/uL   RBC 4.74 4.22 - 5.81 MIL/uL   Hemoglobin 15.0 13.0 - 17.0 g/dL   HCT 44.5 39.0 - 52.0 %   MCV 93.9 80.0 - 100.0 fL   MCH 31.6 26.0 - 34.0 pg   MCHC 33.7 30.0 - 36.0 g/dL   RDW 14.2 11.5 - 15.5 %   Platelets 156 150 - 400 K/uL   nRBC 0.0 0.0 - 0.2 %    Comment: Performed at [facility], [address] [place], [place] [address]  Differential     Status: None   Collection Time: 08/27/18  2:36 PM  Result Value Ref Range   Neutrophils Relative % 72 %   Neutro Abs 5.1 1.7 - 7.7 K/uL   Lymphocytes Relative 18 %   Lymphs Abs 1.3 0.7 - 4.0 K/uL   Monocytes Relative 7 %   Monocytes Absolute 0.5 0.1 - 1.0 K/uL   Eosinophils Relative 3 %   Eosinophils Absolute 0.2 0.0 - 0.5 K/uL   Basophils Relative 0 %   Basophils Absolute 0.0 0.0 - 0.1 K/uL   Immature Granulocytes 0 %   Abs Immature Granulocytes 0.02 0.00 - 0.07 K/uL    Comment: Performed at [facility], [address] [place], [place] [address]  Comprehensive metabolic panel     Status: Abnormal   Collection Time: 08/27/18  2:36 PM  Result Value Ref Range   Sodium 142 135 - 145 mmol/L   Potassium 3.8 3.5 - 5.1 mmol/L   Chloride 105 98 - 111 mmol/L   CO2 29 22 - 32 mmol/L   Glucose, Bld 130 (H) 70 - 99 mg/dL   BUN 31 (H) 8 - 23 mg/dL   Creatinine, Ser 1.25 (H) 0.61 - 1.24 mg/dL   Calcium 9.8 8.9 - 10.3 mg/dL   Total Protein 7.3 6.5 - 8.1 g/dL   Albumin 4.2 3.5 - 5.0 g/dL   AST 24  15 - 41 U/L  ALT 27 0 - 44 U/L   Alkaline Phosphatase 110 38 - 126 U/L   Total Bilirubin 0.7 0.3 - 1.2 mg/dL   GFR calc non Af Amer 51 (L) >60 mL/min   GFR calc Af Amer 60 (L) >60 mL/min   Anion gap 8 5 - 15    Comment: Performed at [facility], [address], [place], [place] [address]  SARS Coronavirus 2 (CEPHEID - Performed in [place] hospital lab), Hosp Order     Status: None   Collection Time: 08/27/18  5:31 PM  Result Value Ref Range   SARS Coronavirus 2 NEGATIVE NEGATIVE    Comment: (NOTE) If result is NEGATIVE SARS-CoV-2 target nucleic acids are NOT DETECTED. The SARS-CoV-2 RNA is generally detectable in upper and lower  respiratory specimens during the acute phase of infection. The lowest  concentration of SARS-CoV-2 viral copies this assay can detect is 250  copies / mL. A negative result does not preclude SARS-CoV-2 infection  and should not be used as the sole basis for treatment or other  patient management decisions.  A negative result may occur with  improper specimen collection / handling, submission of specimen other  than nasopharyngeal swab, presence of viral mutation(s) within the  areas targeted by this assay, and inadequate number of viral copies  (<250 copies / mL). A negative result must be combined with clinical  observations, patient history, and epidemiological information. If result is POSITIVE SARS-CoV-2 target nucleic acids are DETECTED. The SARS-CoV-2 RNA is generally detectable in upper and lower  respiratory specimens dur ing the acute phase of infection.  Positive  results are indicative of active infection with SARS-CoV-2.  Clinical  correlation with patient history and other diagnostic information is  necessary to determine patient infection status.  Positive results do  not rule out bacterial infection or co-infection with other viruses. If result is PRESUMPTIVE POSTIVE SARS-CoV-2 nucleic acids MAY BE PRESENT.   A presumptive  positive result was obtained on the submitted specimen  and confirmed on repeat testing.  While 2019 novel coronavirus  (SARS-CoV-2) nucleic acids may be present in the submitted sample  additional confirmatory testing may be necessary for epidemiological  and / or clinical management purposes  to differentiate between  SARS-CoV-2 and other Sarbecovirus currently known to infect humans.  If clinically indicated additional testing with an alternate test  methodology [id number]) is advised. The SARS-CoV-2 RNA is generally  detectable in upper and lower respiratory sp ecimens during the acute  phase of infection. The expected result is Negative. Fact Sheet for Patients:  StrictlyIdeas.no Fact Sheet for Healthcare Providers: BankingDealers.co.za This test is not yet approved or cleared by the [place] FDA and has been authorized for detection and/or diagnosis of SARS-CoV-2 by FDA under an Emergency Use Authorization (EUA).  This EUA will remain in effect (meaning this test can be used) for the duration of the COVID-19 declaration under Section 564(b)(1) of the Act, 21 U.S.C. section 360bbb-3(b)(1), unless the authorization is terminated or revoked sooner. Performed at [facility], [place], [place] [address]    Ct Head Wo Contrast  Result Date: 08/27/2018 CLINICAL DATA:  Slurred speech for 2 days. EXAM: CT HEAD WITHOUT CONTRAST TECHNIQUE: Contiguous axial images were obtained from the base of the skull through the vertex without intravenous contrast. COMPARISON:  04/05/2018. FINDINGS: Brain: No evidence for acute infarction, hemorrhage, mass lesion, hydrocephalus, or extra-axial fluid. Generalized atrophy. Advanced hypoattenuation of white matter, consistent with  small vessel disease. Numerous areas of prior brain insult, including ischemic lacunar infarct LEFT basal ganglia, and previous RIGHT basal ganglia/centrum  semiovale hemorrhage. Vascular: Calcification of the cavernous internal carotid arteries consistent with cerebrovascular atherosclerotic disease. No signs of intracranial large vessel occlusion. Skull: Calvarium intact. Sinuses/Orbits: No acute finding. Other: None. IMPRESSION: Atrophy and small vessel disease. No acute intracranial findings. Resolved RIGHT basal ganglia/centrum semiovale hemorrhage. Electronically Signed   By: Staci Righter M.D.   On: 08/27/2018 14:54    Pending Labs [employer] (From admission, onward)    Start     Ordered   Signed and Held  Hemoglobin A1c  Tomorrow morning,   R     Signed and Held   Signed and Held  Lipid panel  Tomorrow morning,   R    Comments:  Fasting    Signed and Held   Signed and Held  CBC  Tomorrow morning,   R     Signed and Held   Signed and Held  Basic metabolic panel  Tomorrow morning,   R     Signed and Held          Vitals/Pain Today's Vitals   08/27/18 1700 08/27/18 1730 08/27/18 1800 08/27/18 1830  BP: (!) 147/69 (!) 163/78 (!) 160/74 (!) 162/72  Pulse: 64 61 62 (!) 59  Resp: 15 18 16 17   Temp:      TempSrc:      SpO2: 95% 96% 95% 95%  Weight:      Height:      PainSc:        Isolation Precautions No active isolations  Medications Medications  sodium chloride flush (NS) 0.9 % injection 3 mL (has no administration in time range)    Mobility non-ambulatory High fall risk   Focused Assessments Neuro Assessment Handoff:  Swallow screen pass? No  Cardiac Rhythm: Normal sinus rhythm NIH Stroke Scale ( + Modified Stroke Scale Criteria)  Level of Consciousness (1a.)   : Alert, keenly responsive LOC Questions (1b. )   +: Answers both questions correctly LOC Commands (1c. )   + : Performs both tasks correctly Best Gaze (2. )  +: Normal Visual (3. )  +: No visual loss Facial Palsy (4. )    : Minor paralysis Motor Arm, Left (5a. )   +: Drift Motor Arm, Right (5b. )   +: No drift Motor Leg, Left (6a. )   +: No  drift Motor Leg, Right (6b. )   +: No drift Limb Ataxia (7. ): Present in one limb Sensory (8. )   +: Normal, no sensory loss Best Language (9. )   +: Mild-to-moderate aphasia Dysarthria (10. ): Mild-to-moderate dysarthria, patient slurs at least some words and, at worst, can be understood with some difficulty Extinction/Inattention (11.)   +: No Abnormality Modified SS Total  +: 2 Complete NIHSS TOTAL: 5 Last date known well: 08/25/18   Neuro Assessment: Exceptions to WDL Neuro Checks:      Last Documented NIHSS Modified Score: 2 (08/27/18 1619) Has TPA been given? No If patient is a Neuro Trauma and patient is going to OR before floor call report to [place] nurse: [phone] or [phone]     R Recommendations: See Admitting Provider Note  Report given to:   Additional Notes:

## 2018-08-27 NOTE — H&P (Addendum)
[facility] [place] NAME: Jimmy [name]    MR#:  [id number]  DATE OF BIRTH:  Jan 18, 1929  DATE OF ADMISSION:  08/27/2018  PRIMARY CARE PHYSICIAN: Ria Bush, MD   REQUESTING/REFERRING PHYSICIAN: Lavonia Drafts, MD  CHIEF COMPLAINT:   Chief Complaint  Patient presents with  . Aphasia    HISTORY OF PRESENT ILLNESS:  Jimmy [name]  is a 85 y.o. male with a known history of CAD s/p CABG x4, carotid stenosis, CKD 3, history CLL, chronic diastolic CHF, history of hemorrhagic CVA with left-sided weakness, hypertension, hyperlipidemia who presented to the ED with slurred speech over the last 2 days.  Patient also endorses difficulty swallowing.  He feels like he cannot control his saliva.  He denies any numbness, tingling, or weakness of his extremities compared to baseline (patient has residual left-sided weakness from previous stroke).  He denies any headache or vision changes.  In the ED, he was mildly hypertensive with blood pressures in the 160s/70s.  Labs were significant for creatinine 1.25.  CT head was negative.  COVID testing was negative.  Hospitalists were called for admission.  PAST MEDICAL HISTORY:   Past Medical History:  Diagnosis Date  . CAD (coronary artery disease)    a. 04/2001 S/P CABGx 4;  b. 2008 MV:  EF 63% normal perfusion.  . Carotid stenosis    a. 11/2012 Carotid U/S:  RICA 08-14%, LICA 48-18%.  . Chronic kidney disease, stage 3 ([place])   . CLL (chronic lymphoblastic leukemia) 2009   Stage IV; Dr. Ivor Messier - referred to Dr. Lissa Merlin [facility] 07/2013 now on Rituxan (10/2013) --> stage 0 (09/2014)  . Diastolic CHF ([place])    a. 07/6312 Echo: EF 55-65%, mild conc LVH, Gr 1 DD, mild AS, triv AI, mildly dil Ao root (3.5 cm).  . GERD (gastroesophageal reflux disease) 1990s  . History of CVA (cerebrovascular accident) 2015   by MRI - remote L internal capsule  . History of herpes genitalis    valtrex daily  . Hyperlipemia  2002  . Hypertension 2002  . Mass of submandibular region 2015   referred to gen surg after chemo  . Mild aortic stenosis    a. 07/2011 Echo: Mild AS, triv AI.  [name] Stroke ([place])   . Thrombocytopenia ([place])    outpatient goals Hgb >9, plt >20  . TIA (transient ischemic attack)    04/2016    PAST SURGICAL HISTORY:   Past Surgical History:  Procedure Laterality Date  . ANGIOPLASTY  1998  . CATARACT EXTRACTION, BILATERAL     R 1/09, L 8/09  . COLONOSCOPY  11/29/1987   Normal  . COLONOSCOPY  02/07/2003   Hemm. Internal, focal proctitis, negative biopsy  . COLONOSCOPY  12/12/2004   Internal external hemorrhoids, +proctits, negative biopsy  . CORONARY ANGIOPLASTY  1998  . CORONARY ARTERY BYPASS GRAFT  04/23/2001   x4, Dr. Pia Mau  . ESOPHAGOGASTRODUODENOSCOPY  11/29/1987   Gastritis  . ETT  12/10/2006   Persantine myoview nml  . HEMORROIDECTOMY  1954   Fissure repair, [place]  . HEPATIC ARTERY ANGIOPLASTY  1954   [employer], [place]  . KIDNEY STONE SURGERY  1977   Dr Redmond Baseman  . LITHOTRIPSY  1990s   Multiple  . US ECHOCARDIOGRAPHY  07/2011   nl sys fxn, EF 55-60%, grade 1 diast dysfunction, mild AS, mildly elevated PA pressure    SOCIAL HISTORY:   Social History   Tobacco Use  .  Smoking status: Former Smoker    Packs/day: 2.00    Years: 30.00    Pack years: 60.00    Last attempt to quit: 02/28/1979    Years since quitting: 39.5  . Smokeless tobacco: Never Used  . Tobacco comment: quit 1980  Substance Use Topics  . Alcohol use: No    Alcohol/week: 0.0 standard drinks    Comment: occasional wine    FAMILY HISTORY:   Family History  Problem Relation Age of Onset  . Hypertension Mother   . Heart disease Mother   . Hyperlipidemia Mother   . Hypertension Father   . Hyperlipidemia Father   . Kidney cancer Sister        Renal cell cancer  . Alcohol abuse Brother   . Diabetes Brother   . Stroke Brother   . Heart attack Brother        MI  . Diabetes Other         Sister's daughter  . Depression Daughter        Bipolar    DRUG ALLERGIES:   Allergies  Allergen Reactions  . New Skin Other (See Comments)    unknown  . Tape Dermatitis    REVIEW OF SYSTEMS:   Review of Systems  Constitutional: Negative for chills and fever.  HENT: Negative for congestion and sore throat.   Eyes: Negative for blurred vision and double vision.  Respiratory: Negative for cough.   Cardiovascular: Negative for chest pain and palpitations.  Gastrointestinal: Negative for nausea and vomiting.  Genitourinary: Negative for dysuria and urgency.  Musculoskeletal: Negative for back pain and neck pain.  Neurological: Positive for speech change and focal weakness. Negative for dizziness, tingling, sensory change and headaches.  Psychiatric/Behavioral: Negative for depression. The patient is not nervous/anxious.     MEDICATIONS AT HOME:   Prior to Admission medications   Medication Sig Start Date End Date Taking? Authorizing Provider  acetaminophen (TYLENOL) 325 MG tablet Take 2 tablets (650 mg total) by mouth every 4 (four) hours as needed for mild pain (or temp > 37.5 C (99.5 F)). 04/29/18   Angiulli, Lavon Paganini, PA-C  allopurinol (ZYLOPRIM) 300 MG tablet Take 1 tablet (300 mg total) by mouth daily. 07/17/18   Ria Bush, MD  aspirin 81 MG tablet Take 1 tablet (81 mg total) by mouth daily. 07/17/18   Ria Bush, MD  cholecalciferol (VITAMIN D) 1000 units tablet Take 2,000 Units by mouth daily.    [provider]  lisinopril (PRINIVIL,ZESTRIL) 5 MG tablet Take 1 tablet (5 mg total) by mouth daily. 04/29/18   Angiulli, Lavon Paganini, PA-C  nitroGLYCERIN (NITROSTAT) 0.4 MG SL tablet Place 1 tablet (0.4 mg total) under the tongue every 5 (five) minutes as needed for chest pain (max 3 doses in 15 minutes). 05/18/14   Minna Merritts, MD  polyethylene glycol powder (GLYCOLAX/MIRALAX) powder Take 17 g by mouth daily. 06/16/18   Ria Bush, MD  simvastatin  (ZOCOR) 10 MG tablet TAKE 1 TABLET AT BEDTIME Patient taking differently: Take 10 mg by mouth daily.  01/08/18   Ria Bush, MD  valACYclovir (VALTREX) 500 MG tablet Take 1 tablet (500 mg total) by mouth daily. 07/17/18   Ria Bush, MD  vitamin B-12 (CYANOCOBALAMIN) 1000 MCG tablet Take 1 tablet (1,000 mcg total) by mouth daily. 06/17/17   Ria Bush, MD      VITAL SIGNS:  Blood pressure (!) 147/69, pulse 64, temperature 97.6 F (36.4 C), temperature source Oral, resp. rate  15, height 5' 9.5" (1.765 m), weight 68 kg, SpO2 95 %.  PHYSICAL EXAMINATION:  Physical Exam  GENERAL:  85 y.o.-year-old patient lying in the bed with no acute distress.  EYES: Pupils equal, round, reactive to light and accommodation. No scleral icterus. Extraocular muscles intact.  HEENT: Head atraumatic, normocephalic. Oropharynx and nasopharynx clear.  NECK:  Supple, no jugular venous distention. No thyroid enlargement, no tenderness.  LUNGS: Normal breath sounds bilaterally, no wheezing, rales,rhonchi or crepitation. No use of accessory muscles of respiration.  CARDIOVASCULAR: RRR, S1, S2 normal. No murmurs, rubs, or gallops.  ABDOMEN: Soft, nontender, nondistended. Bowel sounds present. No organomegaly or mass.  EXTREMITIES: No pedal edema, cyanosis, or clubbing.  NEUROLOGIC: Muscle strength 5/5 in the RUE and RLE. 3/5 L grip strength and LUE strength. 4/5 LLE strength. Sensation intact throughout. +slurred speech.  PSYCHIATRIC: The patient is alert and oriented x 3.  SKIN: No obvious rash, lesion, or ulcer.   LABORATORY PANEL:   CBC Recent Labs  Lab 08/27/18 1436  WBC 7.2  HGB 15.0  HCT 44.5  PLT 156   ------------------------------------------------------------------------------------------------------------------  Chemistries  Recent Labs  Lab 08/27/18 1436  NA 142  K 3.8  CL 105  CO2 29  GLUCOSE 130*  BUN 31*  CREATININE 1.25*  CALCIUM 9.8  AST 24  ALT 27  ALKPHOS  110  BILITOT 0.7   ------------------------------------------------------------------------------------------------------------------  Cardiac Enzymes No results for input(s): TROPONINI in the last 168 hours. ------------------------------------------------------------------------------------------------------------------  RADIOLOGY:  Ct Head Wo Contrast  Result Date: 08/27/2018 CLINICAL DATA:  Slurred speech for 2 days. EXAM: CT HEAD WITHOUT CONTRAST TECHNIQUE: Contiguous axial images were obtained from the base of the skull through the vertex without intravenous contrast. COMPARISON:  04/05/2018. FINDINGS: Brain: No evidence for acute infarction, hemorrhage, mass lesion, hydrocephalus, or extra-axial fluid. Generalized atrophy. Advanced hypoattenuation of white matter, consistent with small vessel disease. Numerous areas of prior brain insult, including ischemic lacunar infarct LEFT basal ganglia, and previous RIGHT basal ganglia/centrum semiovale hemorrhage. Vascular: Calcification of the cavernous internal carotid arteries consistent with cerebrovascular atherosclerotic disease. No signs of intracranial large vessel occlusion. Skull: Calvarium intact. Sinuses/Orbits: No acute finding. Other: None. IMPRESSION: Atrophy and small vessel disease. No acute intracranial findings. Resolved RIGHT basal ganglia/centrum semiovale hemorrhage. Electronically Signed   By: Staci Righter M.D.   On: 08/27/2018 14:54      IMPRESSION AND PLAN:   Slurred speech- concern for acute CVA.  CT head was negative.  Patient does have a history of hemorrhagic stroke in 1/20. -MRI brain -ECHO ([facility]) and carotid dopplers -Neurology consult -Neuro checks -Continue home aspirin and statin -Patient failed bedside swallow in the ED.  Will keep patient n.p.o. for now. -PT/OT/SLP evals  Hypertension- blood pressures mildly elevated in the ED.  Will allow for permissive hypertension. -Continue home lisinopril  CKD  3-creatinine at baseline -Avoid nephrotoxic agents -Monitor creatinine  Chronic diastolic congestive heart failure- does not appear volume overloaded. -Continue lisinopril -ECHO ordered -Monitor  History of R carotid stenosis-most recent Doppler carotid with 40-59% stenosis of the right ICA. -Repeat carotid Dopplers  All the records are reviewed and case discussed with ED provider. Management plans discussed with the patient, family and they are in agreement.  CODE STATUS: DNI  TOTAL TIME TAKING CARE OF THIS PATIENT: 45 minutes.    Berna Spare Adren Dollins M.D on 08/27/2018 at 6:26 PM  Between 7am to 6pm - Pager [phone]  After 6pm go to [email or web address] - [place]  [facility]  Office  [phone]  CC: Primary care physician; Ria Bush, MD   Note: This dictation was prepared with Dragon dictation along with smaller phrase technology. Any transcriptional errors that result from this process are unintentional.

## 2018-08-27 NOTE — ED Notes (Signed)
Patient's wife updated, notified that he will be in rm 129

## 2018-08-27 NOTE — Progress Notes (Signed)
Family Meeting Note  Advance Directive:yes  Today a meeting took place with the Patient.  Patient is able to participate.  The following clinical team members were present during this meeting:MD  The following were discussed:Patient's diagnosis: slurred speech, Patient's progosis: Unable to determine and Goals for treatment: DNI   Discussed CODE STATUS in detail.  Patient would like to undergo cardiac resuscitation, but would never want to be intubated under any circumstance. Will make patient DNI.  Additional follow-up to be provided: prn  Time spent during discussion:20 minutes  Evette Doffing, MD

## 2018-08-28 ENCOUNTER — Inpatient Hospital Stay: Payer: Medicare Other

## 2018-08-28 ENCOUNTER — Inpatient Hospital Stay (HOSPITAL_COMMUNITY)
Admit: 2018-08-28 | Discharge: 2018-08-28 | Disposition: A | Discharge status: Home / Self Care | Payer: Medicare Other | Attending: Internal Medicine | Admitting: Internal Medicine

## 2018-08-28 DIAGNOSIS — I351 Nonrheumatic aortic (valve) insufficiency: Secondary | ICD-10-CM

## 2018-08-28 DIAGNOSIS — I639 Cerebral infarction, unspecified: Principal | ICD-10-CM

## 2018-08-28 DIAGNOSIS — I371 Nonrheumatic pulmonary valve insufficiency: Secondary | ICD-10-CM

## 2018-08-28 LAB — BASIC METABOLIC PANEL
Anion gap: 7 (ref 5–15)
BUN: 27 mg/dL — ABNORMAL HIGH (ref 8–23)
CO2: 28 mmol/L (ref 22–32)
Calcium: 9.2 mg/dL (ref 8.9–10.3)
Chloride: 106 mmol/L (ref 98–111)
Creatinine, Ser: 1.14 mg/dL (ref 0.61–1.24)
GFR calc Af Amer: >60 mL/min (ref >60)
GFR calc non Af Amer: 58 mL/min — ABNORMAL LOW (ref >60)
Glucose, Bld: 97 mg/dL (ref 70–99)
Potassium: 3.5 mmol/L (ref 3.5–5.1)
Sodium: 141 mmol/L (ref 135–145)

## 2018-08-28 LAB — CBC
HCT: 41.8 % (ref 39.0–52.0)
Hemoglobin: 14 g/dL (ref 13.0–17.0)
MCH: 30.8 pg (ref 26.0–34.0)
MCHC: 33.5 g/dL (ref 30.0–36.0)
MCV: 91.9 fL (ref 80.0–100.0)
Platelets: 129 10*3/uL — ABNORMAL LOW (ref 150–400)
RBC: 4.55 MIL/uL (ref 4.22–5.81)
RDW: 13.9 % (ref 11.5–15.5)
WBC: 5.4 10*3/uL (ref 4.0–10.5)
nRBC: 0 % (ref 0.0–0.2)

## 2018-08-28 LAB — LIPID PANEL
Cholesterol: 118 mg/dL (ref 0–200)
HDL: 35 mg/dL — ABNORMAL LOW (ref >40)
LDL Cholesterol: 56 mg/dL (ref 0–99)
Total CHOL/HDL Ratio: 3.4 RATIO
Triglycerides: 135 mg/dL (ref <150)
VLDL: 27 mg/dL (ref 0–40)

## 2018-08-28 LAB — ECHOCARDIOGRAM COMPLETE
Height: 69.5 in
Weight: 2400 oz

## 2018-08-28 MED ORDER — DIPHENHYDRAMINE HCL 50 MG/ML IJ SOLN
12.5000 mg | Freq: Once | INTRAMUSCULAR | Status: AC
  Administered 2018-08-28: 12.5 mg via INTRAVENOUS
  Filled 2018-08-28: qty 1

## 2018-08-28 MED ORDER — HYDRALAZINE HCL 20 MG/ML IJ SOLN
5.0000 mg | Freq: Four times a day (QID) | INTRAMUSCULAR | Status: DC | PRN
  Administered 2018-08-28: 5 mg via INTRAVENOUS
  Filled 2018-08-28: qty 1

## 2018-08-28 MED ORDER — ASPIRIN 325 MG PO TBEC: 81.0000 mg | DELAYED_RELEASE_TABLET | Freq: Every day | ORAL | 0 refills | Status: AC

## 2018-08-28 NOTE — Evaluation (Signed)
Physical Therapy Evaluation Patient Details Name: Jimmy [name] MRN: [id number] DOB: 06-Dec-1928 Today's Date: 08/28/2018   History of Present Illness  85 y.o. male with a known history of CAD s/p CABG x4, carotid stenosis, CKD 3, history CLL, chronic diastolic CHF, history of hemorrhagic CVA with left-sided weakness, hypertension, hyperlipidemia who presented to the ED with slurred speech over the last 2 days. Found to have had an acute CVA.  Clinical Impression  Pt eager to work with PT and did relatively well.  He had h/o L sided weakness that he reports is now more profound, clear issues with coordination on the L (UE>LE) but most of the new onset issues are related to speech and swallowing.  Pt reports he has had multiple recent falls in the last week but with walker was able to ambulate < 100 ft but had inconsistent cadence.  He displayed nearly symmetrical cadence with walker while in the open hallway, but displayed uncoordinated L LE control in more confined spaces with poor toe clearance, shortened (almost dragging) step length and increased UE reliance.  Pt also displayed inconsistent sitting balance at EOB, at time maintaining well and then having slow LOBs back to the to the R that he could not self arrest despite R UE use and cues that he was leaning.  Pt was to initiate outpt PT when the Covid situation was starting and he never got in, now needing HHPT.    Follow Up Recommendations Home health PT    Equipment Recommendations  None recommended by PT    Recommendations for Other Services       Precautions / Restrictions Precautions Precautions: Fall Restrictions Weight Bearing Restrictions: No      Mobility  Bed Mobility Overal bed mobility: Modified Independent             General bed mobility comments: needed rails but able to get to sitting w/o assist  Transfers Overall transfer level: Modified independent Equipment used: Rolling walker (2 wheeled)              General transfer comment: Pt needed light cuing to insure appropriate UE use and set up, but able to rise w/o assist  Ambulation/Gait Ambulation/Gait assistance: Supervision Gait Distance (Feet): 150 Feet Assistive device: Rolling walker (2 wheeled)       General Gait Details: Pt with variable ambulation t/o the session, he showed poor coordination with L toes dragging and LE trailing behind in more enclosed environments, once in the more open hallway he was able to take longer and more coordinated steps with L and had much improved cadence.  Once back in more confined room he reverted to poor L LE control and quality of motion  Stairs            Wheelchair Mobility    Modified Rankin (Stroke Patients Only)       Balance Overall balance assessment: Needs assistance Sitting-balance support: Single extremity supported Sitting balance-Leahy Scale: Fair Sitting balance - Comments: Pt able to maintain balance at EOB much of the time, but did have multiple bouts of leaning back and to the R needing assist on a few occasions to remain upright     Standing balance-Leahy Scale: Fair Standing balance comment: Definite need of walker but with b/l UE use was stable`                             Pertinent Vitals/Pain Pain Assessment: No/denies  pain    Home Living Family/patient expects to be discharged to:: Private residence Living Arrangements: Spouse/significant other Available Help at Discharge: Family Type of Home: House Home Access: Stairs to enter Entrance Stairs-Rails: Psychiatric nurse of Steps: 4 in front and in garage, ramp on back Home Layout: One [place]: [place] - 2 wheels;Walker - 4 wheels;Shower seat;Wheelchair - manual;Hand held shower head      Prior Function Level of Independence: Independent         Comments: Pt reports that he tries walk every day the weather permits, uses walker but stays active, has had  5+ falls this year     Hand Dominance        Extremity/Trunk Assessment   Upper Extremity Assessment Upper Extremity Assessment: (R WNL, L grossly 2+/5, changed from basline weakness)    Lower Extremity Assessment Lower Extremity Assessment: (R WNL, L grossly 4/5 with coordination defecits)       Communication   Communication: Expressive difficulties(dysarthria, emotionally labile)  Cognition Arousal/Alertness: Awake/alert Behavior During Therapy: WFL for tasks assessed/performed Overall Cognitive Status: Within Functional Limits for tasks assessed                                        General Comments      Exercises     Assessment/Plan    PT Assessment Patient needs continued PT services  PT Problem List Decreased strength;Decreased balance;Decreased range of motion;Decreased activity tolerance;Decreased mobility;Decreased coordination;Decreased knowledge of use of DME;Decreased safety awareness       PT Treatment Interventions DME instruction;Gait training;Functional mobility training;Therapeutic exercise;Therapeutic activities;Neuromuscular re-education;Balance training;Patient/family education    PT Goals (Current goals can be found in the Care Plan section)  Acute Rehab PT Goals Patient Stated Goal: get back to walking daily PT Goal Formulation: With patient Time For Goal Achievement: 09/11/18 Potential to Achieve Goals: Good    Frequency 7X/week   Barriers to discharge        Co-evaluation               AM-PAC PT "6 Clicks" Mobility  Outcome Measure Help needed turning from your back to your side while in a flat bed without using bedrails?: None Help needed moving from lying on your back to sitting on the side of a flat bed without using bedrails?: A Little Help needed moving to and from a bed to a chair (including a wheelchair)?: A Little Help needed standing up from a chair using your arms (e.g., wheelchair or bedside  chair)?: A Little Help needed to walk in hospital room?: A Little Help needed climbing 3-5 steps with a railing? : A Lot 6 Click Score: 18    End of Session Equipment Utilized During Treatment: Gait belt Activity Tolerance: Patient tolerated treatment well Patient left: with chair alarm set;with call bell/phone within reach Nurse Communication: Mobility status PT Visit Diagnosis: Muscle weakness (generalized) (M62.81);Ataxic gait (R26.0);Hemiplegia and hemiparesis Hemiplegia - Right/Left: Left Hemiplegia - dominant/non-dominant: Non-dominant Hemiplegia - caused by: Cerebral infarction    Time: 0902-0937 PT Time Calculation (min) (ACUTE ONLY): 35 min   Charges:   PT Evaluation $PT Eval Low Complexity: 1 Low PT Treatments $Gait Training: 8-22 mins        Kreg Shropshire, DPT 08/28/2018, 10:47 AM

## 2018-08-28 NOTE — Evaluation (Signed)
Objective Swallowing Evaluation: Type of Study: MBS-Modified Barium Swallow Study   Patient Details  Name: Jimmy [name] MRN: [id number] Date of Birth: Jul 16, 1928  Today's Date: 08/28/2018 Time: SLP Start Time (ACUTE ONLY): 1334 -SLP Stop Time (ACUTE ONLY): 1434  SLP Time Calculation (min) (ACUTE ONLY): 60 min   Past Medical History:  Past Medical History:  Diagnosis Date  . CAD (coronary artery disease)    a. 04/2001 S/P CABGx 4;  b. 2008 MV:  EF 63% normal perfusion.  . Carotid stenosis    a. 11/2012 Carotid U/S:  RICA 46-65%, LICA 99-35%.  . Chronic kidney disease, stage 3 ([place])   . CLL (chronic lymphoblastic leukemia) 2009   Stage IV; Dr. Ivor Messier - referred to Dr. Lissa Merlin [facility] 07/2013 now on Rituxan (10/2013) --> stage 0 (09/2014)  . Diastolic CHF ([place])    a. 09/175 Echo: EF 55-65%, mild conc LVH, Gr 1 DD, mild AS, triv AI, mildly dil Ao root (3.5 cm).  . GERD (gastroesophageal reflux disease) 1990s  . History of CVA (cerebrovascular accident) 2015   by MRI - remote L internal capsule  . History of herpes genitalis    valtrex daily  . Hyperlipemia 2002  . Hypertension 2002  . Mass of submandibular region 2015   referred to gen surg after chemo  . Mild aortic stenosis    a. 07/2011 Echo: Mild AS, triv AI.  [name] Stroke ([place])   . Thrombocytopenia ([place])    outpatient goals Hgb >9, plt >20  . TIA (transient ischemic attack)    04/2016   Past Surgical History:  Past Surgical History:  Procedure Laterality Date  . ANGIOPLASTY  1998  . CATARACT EXTRACTION, BILATERAL     R 1/09, L 8/09  . COLONOSCOPY  11/29/1987   Normal  . COLONOSCOPY  02/07/2003   Hemm. Internal, focal proctitis, negative biopsy  . COLONOSCOPY  12/12/2004   Internal external hemorrhoids, +proctits, negative biopsy  . CORONARY ANGIOPLASTY  1998  . CORONARY ARTERY BYPASS GRAFT  04/23/2001   x4, Dr. Pia Mau  . ESOPHAGOGASTRODUODENOSCOPY  11/29/1987   Gastritis  . ETT  12/10/2006   Persantine myoview  nml  . HEMORROIDECTOMY  1954   Fissure repair, [place]  . HEPATIC ARTERY ANGIOPLASTY  1954   [place]  . KIDNEY STONE SURGERY  1977   Dr Redmond Baseman  . LITHOTRIPSY  1990s   Multiple  . US ECHOCARDIOGRAPHY  07/2011   nl sys fxn, EF 55-60%, grade 1 diast dysfunction, mild AS, mildly elevated PA pressure   HPI: H&P 08/27/2018: "Jimmy [name]  is a 85 y.o. male with a known history of CAD s/p CABG x4, carotid stenosis, CKD 3, history CLL, chronic diastolic CHF, history of hemorrhagic CVA with left-sided weakness, hypertension, hyperlipidemia who presented to the ED with slurred speech over the last 2 days.  Patient also endorses difficulty swallowing.  He feels like he cannot control his saliva.  He denies any numbness, tingling, or weakness of his extremities compared to baseline (patient has residual left-sided weakness from previous stroke).  He denies any headache or vision changes.  In the ED, he was mildly hypertensive with blood pressures in the 160s/70s.  Labs were significant for creatinine 1.25.  CT head was negative.  COVID testing was negative.  Hospitalists were called for admission."    The patient had a stroke in January 2020 with dysphagia.  He had an MBS at [facility] with recommendation for mechanical soft diet, nectar-thick  liquids, and head turn left for swallowing.     Chart review shows patient receiving home health speech therapy as of 08/13/2018.   Subjective: "I haven't thickened liquids since I left the hospital"    Assessment / Plan / Recommendation  CHL IP CLINICAL IMPRESSIONS 08/28/2018  Clinical Impression This 85 year old man; with history of stroke and need for modified diet; is presenting with moderate oropharyngeal dysphagia characterized by delayed pharyngeal swallow initiation, reduced pharyngeal pressure generation, moderate pharyngeal residue, audible gross aspiration nectar-thick liquid without head turn, transient laryngeal penetration with nectar-thick and  thin liquids with left head turn.  Oral control of the bolus including oral hold, rotary mastication, and anterior to posterior transfer is within functional limits (slowed oral management, but no other concerns).   The patient remains at risk for prandial aspiration however risk is greatly reduced with consistent head turn left for all swallows.  The patient is able to state this rule and reports that he uses the strategy for all swallows.  He is able to tell me that if he does not turn his head he will cough.  Recommend mechanical soft diet with thin liquids, head turn left for all swallows.  SLP Visit Diagnosis Dysphagia, oropharyngeal phase (R13.12)  Attention and concentration deficit following --  Frontal lobe and executive function deficit following --  Impact on safety and function Mild aspiration risk;with use of head turn left      CHL IP TREATMENT RECOMMENDATION 08/28/2018  Treatment Recommendations Defer treatment plan to f/u with SLP     Prognosis 08/28/2018  Prognosis for Safe Diet Advancement Good  Barriers to Reach Goals Cognitive deficits  Barriers/Prognosis Comment --    CHL IP DIET RECOMMENDATION 08/28/2018  SLP Diet Recommendations Dysphagia 3 (Mech soft) solids;Thin liquid  Liquid Administration via Cup  Medication Administration Whole meds with puree  Compensations Minimize environmental distractions;Slow rate;Small sips/bites;Other (Comment)  Postural Changes Remain semi-upright after after feeds/meals (Comment);Seated upright at 90 degrees      CHL IP OTHER RECOMMENDATIONS 08/28/2018  Recommended Consults --  Oral Care Recommendations Oral care BID  Other Recommendations --      CHL IP FOLLOW UP RECOMMENDATIONS 08/28/2018  Follow up Recommendations Home health SLP      CHL IP FREQUENCY AND DURATION 04/21/2018  Speech Therapy Frequency (ACUTE ONLY) min 3x week  Treatment Duration 1 week           CHL IP ORAL PHASE 08/28/2018  Oral Phase WFL  Oral - Pudding  Teaspoon --  Oral - Pudding Cup --  Oral - Honey Teaspoon --  Oral - Honey Cup --  Oral - Nectar Teaspoon --  Oral - Nectar Cup --  Oral - Nectar Straw --  Oral - Thin Teaspoon --  Oral - Thin Cup --  Oral - Thin Straw --  Oral - Puree --  Oral - Mech Soft --  Oral - Regular --  Oral - Multi-Consistency --  Oral - Pill --  Oral Phase - Comment --    CHL IP PHARYNGEAL PHASE 08/28/2018  Pharyngeal Phase Impaired  Pharyngeal- Pudding Teaspoon --  Pharyngeal --  Pharyngeal- Pudding Cup --  Pharyngeal --  Pharyngeal- Honey Teaspoon --  Pharyngeal --  Pharyngeal- Honey Cup --  Pharyngeal --  Pharyngeal- Nectar Teaspoon --  Pharyngeal --  Pharyngeal- Nectar Cup --2 sips, 1 with head turn, 1 without head turn  Pharyngeal --  Pharyngeal- Nectar Straw --  Pharyngeal --  Pharyngeal- Thin Teaspoon --  Pharyngeal --  Pharyngeal- Thin Cup --3 sips with head turn left  Pharyngeal --  Pharyngeal- Thin Straw --  Pharyngeal --  Pharyngeal- Puree --2 tsp  Pharyngeal --  Pharyngeal- Mechanical Soft --1/4 graham cracker in applesauce  Pharyngeal --  Pharyngeal- Regular --  Pharyngeal --  Pharyngeal- Multi-consistency --  Pharyngeal --  Pharyngeal- Pill --  Pharyngeal --  Pharyngeal Comment --     No flowsheet data found.  Leroy Sea, [place], [place] 08/28/2018, 2:35 PM

## 2018-08-28 NOTE — Progress Notes (Signed)
OT Cancellation Note  Patient Details Name: Jimmy [name] MRN: [id number] DOB: 06/26/1928   Cancelled Treatment:    Reason Eval/Treat Not Completed: Patient at procedure or test/ unavailable(At the beginning of the session transport arrived to take the patient for testing. Will continues to monitor, and intervene at a later time or date)  Harrel Carina, MS, OTR/L 08/28/2018, 2:04 PM

## 2018-08-28 NOTE — Plan of Care (Signed)
  Problem: Education: Goal: Knowledge of disease or condition will improve Outcome: Progressing Goal: Knowledge of secondary prevention will improve Outcome: Progressing Goal: Knowledge of patient specific risk factors addressed and post discharge goals established will improve Outcome: Progressing   Problem: Coping: Goal: Will verbalize positive feelings about self Outcome: Progressing Goal: Will identify appropriate support needs Outcome: Progressing   Problem: Health Behavior/Discharge Planning: Goal: Ability to manage health-related needs will improve Outcome: Progressing   Problem: Self-Care: Goal: Ability to participate in self-care as condition permits will improve Outcome: Progressing Goal: Verbalization of feelings and concerns over difficulty with self-care will improve Outcome: Progressing Goal: Ability to communicate needs accurately will improve Outcome: Progressing   Problem: Nutrition: Goal: Risk of aspiration will decrease Outcome: Progressing Goal: Dietary intake will improve Outcome: Progressing   Problem: Ischemic Stroke/TIA Tissue Perfusion: Goal: Complications of ischemic stroke/TIA will be minimized Outcome: Progressing   Problem: Education: Goal: Knowledge of General Education information will improve Description: Including pain rating scale, medication(s)/side effects and non-pharmacologic comfort measures Outcome: Progressing   Problem: Health Behavior/Discharge Planning: Goal: Ability to manage health-related needs will improve Outcome: Progressing   Problem: Clinical Measurements: Goal: Ability to maintain clinical measurements within normal limits will improve Outcome: Progressing Goal: Will remain free from infection Outcome: Progressing Goal: Diagnostic test results will improve Outcome: Progressing Goal: Respiratory complications will improve Outcome: Progressing Goal: Cardiovascular complication will be avoided Outcome:  Progressing   Problem: Activity: Goal: Risk for activity intolerance will decrease Outcome: Progressing   Problem: Nutrition: Goal: Adequate nutrition will be maintained Outcome: Progressing   Problem: Coping: Goal: Level of anxiety will decrease Outcome: Progressing   Problem: Elimination: Goal: Will not experience complications related to bowel motility Outcome: Progressing Goal: Will not experience complications related to urinary retention Outcome: Progressing   Problem: Pain Managment: Goal: General experience of comfort will improve Outcome: Progressing   Problem: Safety: Goal: Ability to remain free from injury will improve Outcome: Progressing   Problem: Skin Integrity: Goal: Risk for impaired skin integrity will decrease Outcome: Progressing   

## 2018-08-28 NOTE — Progress Notes (Signed)
Patient discharged home per MD order. Prescription given to patient. All discharge instructions given and all questions answered. Wife called for update and discharge plans.

## 2018-08-28 NOTE — TOC Initial Note (Signed)
Transition of Care [facility]) - Initial/Assessment Note    Patient Details  Name: Jimmy [name] MRN: [id number] Date of Birth: [birth year]  [facility] CM/SW Contact:    Su Hilt, RN Phone Number: 08/28/2018, 2:35 PM  Clinical Narrative:                 Met with the patient and we called his wife while I was in the room, she stated that he was at [facility] with PT but came home in Feb, He is going home with [facility] PT with [place] notified Corene Cornea, Patient has a transport wheelchair, a RW, and a Rolator at home,He has no DME needs other than what he has at home, his wife will provide transportation, She states that she will call Monday and get him appointments with neurology and PCP Dr Danise Mina, uses [facility] and can afford them, I explained that the nurse would review the DC paperwork and that he would get a copy, the wife states understanding  Expected Discharge Plan: [place] Barriers to Discharge: Barriers Resolved   Patient Goals and CMS Choice Patient states their goals for this hospitalization and ongoing recovery are:: go home with his wife CMS Medicare.gov Compare Post Acute Care list provided to:: Patient Choice offered to / list presented to : Patient  Expected Discharge Plan and Services Expected Discharge Plan: [place]   Discharge Planning Services: CM Consult Post Acute Care Choice: [place] arrangements for the past 2 months: Single Family Home Expected Discharge Date: 08/28/18               DME Arranged: (none needed)         HH Arranged: PT HH Agency: [place] ([place]) Date HH Agency Contacted: 08/28/18 Time [place]: 1435 Representative spoke with at [place]: Corene Cornea  Prior Living Arrangements/Services Living arrangements for the past 2 months: [place] Lives with:: Spouse Patient language and need for interpreter reviewed::  No Do you feel safe going back to the place where you live?: Yes      Need for Family Participation in Patient Care: Yes (Comment) Care giver support system in place?: Yes (comment) Current home services: DME(rolling walker, rollator, transport WC) Criminal Activity/Legal Involvement Pertinent to Current Situation/Hospitalization: No - Comment as needed  Activities of Daily Living Home Assistive Devices/Equipment: Gilford Rile (specify type) ADL Screening (condition at time of admission) Patient's cognitive ability adequate to safely complete daily activities?: Yes Is the patient deaf or have difficulty hearing?: No Does the patient have difficulty seeing, even when wearing glasses/contacts?: No Does the patient have difficulty concentrating, remembering, or making decisions?: No Patient able to express need for assistance with ADLs?: Yes Does the patient have difficulty dressing or bathing?: No Independently performs ADLs?: No Communication: Independent Dressing (OT): Independent Grooming: Independent with device (comment) Feeding: Appropriate for developmental age Bathing: Independent with device (comment) Toileting: Independent In/Out Bed: Independent with device (comment) Walks in Home: Independent with device (comment) Does the patient have difficulty walking or climbing stairs?: Yes Weakness of Legs: Left Weakness of Arms/Hands: Left  Permission Sought/Granted Permission sought to share information with : Case Manager Permission granted to share information with : Yes, Verbal Permission Granted              Emotional Assessment Appearance:: Appears stated age Attitude/Demeanor/Rapport: Engaged Affect (typically observed): Accepting Orientation: : Oriented to Self, Oriented to Place, Oriented  to  Time, Oriented to Situation Alcohol / Substance Use: Never Used Psych Involvement: No (comment)  Admission diagnosis:  Cerebrovascular accident (CVA), unspecified mechanism ([place])  [I63.9] Patient Active Problem List   Diagnosis Date Noted  . Slurred speech 08/27/2018  . Labile blood pressure   . Hypoalbuminemia due to protein-calorie malnutrition ([place])   . Hypertension with goal blood pressure less than 120/80   . Dysphagia, post-stroke   . Family history of stroke 04/07/2018  . ICH (intracerebral hemorrhage) ([place]) 04/04/2018  . MCI (mild cognitive impairment) with memory loss 06/17/2017  . Dizziness 06/17/2017  . Diarrhea 10/18/2016  . TIA (transient ischemic attack) 04/08/2016  . Thrombocytopenia ([place]) 04/08/2016  . Leg weakness 11/11/2014  . Advanced care planning/counseling discussion 09/16/2014  . Nocturia 09/16/2014  . Mass of submandibular region   . History of CVA (cerebrovascular accident)   . Chest pressure 11/19/2013  . S/P CABG (coronary artery bypass graft) 11/19/2013  . Chronic kidney disease, stage 3 ([place])   . Aortic stenosis   . Medicare annual wellness visit, subsequent 08/27/2012  . Vitamin D deficiency 08/17/2012  . Dyspnea 06/26/2011  . Erectile dysfunction 09/18/2010  . Carotid artery stenosis 12/01/2009  . UNSPECIFIED SUBJECTIVE VISUAL DISTURBANCE 05/01/2009  . BACK PAIN, LUMBAR 10/24/2008  . Chronic lymphocytic leukemia, Rai stage 0 ([place]) 04/02/2007    Class: Chronic  . DERMATITIS, SEBORRHEIC NOS 12/16/2006  . GENITAL HERPES 12/04/2006  . Mixed hyperlipidemia 12/04/2006  . Essential hypertension 12/04/2006  . Coronary atherosclerosis 12/04/2006  . GERD 12/04/2006  . Prediabetes 12/04/2006  . RENAL CALCULUS, HX OF 12/04/2006   PCP:  Ria Bush, MD Pharmacy:   [facility] [place] [address] [place] [address] Phone: [phone] Fax: [facility] #[id number] [place], [place] - [place] [address] [place] [address] Phone: [phone] Fax: [phone]     Social  Determinants of Health (SDOH) Interventions    Readmission Risk Interventions No flowsheet data found.

## 2018-08-28 NOTE — ED Provider Notes (Signed)
[facility] Emergency Department Provider Note   ____________________________________________    I have reviewed the triage vital signs and the nursing notes.   HISTORY  Chief Complaint Aphasia     HPI Jimmy [name] is a 85 y.o. male who presents with approximately 2 days of slurred speech and reports of difficulty swallowing.  Patient has extensive past medical history as detailed below most notably history of CKD, CLL, CAD as well as a reported stroke with hospitalization in January.  He denies other new neuro deficits.  No reports of fevers or chills.  No cough shortness of breath.  No dysuria  Past Medical History:  Diagnosis Date  . CAD (coronary artery disease)    a. 04/2001 S/P CABGx 4;  b. 2008 MV:  EF 63% normal perfusion.  . Carotid stenosis    a. 11/2012 Carotid U/S:  RICA 52-77%, LICA 82-42%.  . Chronic kidney disease, stage 3 ([place])   . CLL (chronic lymphoblastic leukemia) 2009   Stage IV; Dr. Ivor Messier - referred to Dr. Lissa Merlin [facility] 07/2013 now on Rituxan (10/2013) --> stage 0 (09/2014)  . Diastolic CHF ([place])    a. 05/5359 Echo: EF 55-65%, mild conc LVH, Gr 1 DD, mild AS, triv AI, mildly dil Ao root (3.5 cm).  . GERD (gastroesophageal reflux disease) 1990s  . History of CVA (cerebrovascular accident) 2015   by MRI - remote L internal capsule  . History of herpes genitalis    valtrex daily  . Hyperlipemia 2002  . Hypertension 2002  . Mass of submandibular region 2015   referred to gen surg after chemo  . Mild aortic stenosis    a. 07/2011 Echo: Mild AS, triv AI.  [name] Stroke ([place])   . Thrombocytopenia ([place])    outpatient goals Hgb >9, plt >20  . TIA (transient ischemic attack)    04/2016    Patient Active Problem List   Diagnosis Date Noted  . Slurred speech 08/27/2018  . Labile blood pressure   . Hypoalbuminemia due to protein-calorie malnutrition ([place])   . Hypertension with goal blood pressure less than 120/80   . Dysphagia,  post-stroke   . Family history of stroke 04/07/2018  . ICH (intracerebral hemorrhage) ([place]) 04/04/2018  . MCI (mild cognitive impairment) with memory loss 06/17/2017  . Dizziness 06/17/2017  . Diarrhea 10/18/2016  . TIA (transient ischemic attack) 04/08/2016  . Thrombocytopenia ([place]) 04/08/2016  . Leg weakness 11/11/2014  . Advanced care planning/counseling discussion 09/16/2014  . Nocturia 09/16/2014  . Mass of submandibular region   . History of CVA (cerebrovascular accident)   . Chest pressure 11/19/2013  . S/P CABG (coronary artery bypass graft) 11/19/2013  . Chronic kidney disease, stage 3 ([place])   . Aortic stenosis   . Medicare annual wellness visit, subsequent 08/27/2012  . Vitamin D deficiency 08/17/2012  . Dyspnea 06/26/2011  . Erectile dysfunction 09/18/2010  . Carotid artery stenosis 12/01/2009  . UNSPECIFIED SUBJECTIVE VISUAL DISTURBANCE 05/01/2009  . BACK PAIN, LUMBAR 10/24/2008  . Chronic lymphocytic leukemia, Rai stage 0 ([place]) 04/02/2007    Class: Chronic  . DERMATITIS, SEBORRHEIC NOS 12/16/2006  . GENITAL HERPES 12/04/2006  . Mixed hyperlipidemia 12/04/2006  . Essential hypertension 12/04/2006  . Coronary atherosclerosis 12/04/2006  . GERD 12/04/2006  . Prediabetes 12/04/2006  . RENAL CALCULUS, HX OF 12/04/2006    Past Surgical History:  Procedure Laterality Date  . ANGIOPLASTY  1998  . CATARACT EXTRACTION, BILATERAL     R  1/09, L 8/09  . COLONOSCOPY  11/29/1987   Normal  . COLONOSCOPY  02/07/2003   Hemm. Internal, focal proctitis, negative biopsy  . COLONOSCOPY  12/12/2004   Internal external hemorrhoids, +proctits, negative biopsy  . CORONARY ANGIOPLASTY  1998  . CORONARY ARTERY BYPASS GRAFT  04/23/2001   x4, Dr. Pia Mau  . ESOPHAGOGASTRODUODENOSCOPY  11/29/1987   Gastritis  . ETT  12/10/2006   Persantine myoview nml  . HEMORROIDECTOMY  1954   Fissure repair, [place]  . HEPATIC ARTERY ANGIOPLASTY  1954   [place], [place]  . KIDNEY STONE SURGERY   1977   Dr Redmond Baseman  . LITHOTRIPSY  1990s   Multiple  . US ECHOCARDIOGRAPHY  07/2011   nl sys fxn, EF 55-60%, grade 1 diast dysfunction, mild AS, mildly elevated PA pressure    Prior to Admission medications   Medication Sig Start Date End Date Taking? Authorizing Provider  allopurinol (ZYLOPRIM) 300 MG tablet Take 1 tablet (300 mg total) by mouth daily. 07/17/18  Yes Ria Bush, MD  cholecalciferol (VITAMIN D) 1000 units tablet Take 2,000 Units by mouth daily.   Yes [provider]  lisinopril (PRINIVIL,ZESTRIL) 5 MG tablet Take 1 tablet (5 mg total) by mouth daily. 04/29/18  Yes Angiulli, Lavon Paganini, PA-C  simvastatin (ZOCOR) 10 MG tablet TAKE 1 TABLET AT BEDTIME Patient taking differently: Take 10 mg by mouth daily.  01/08/18  Yes Ria Bush, MD  vitamin B-12 (CYANOCOBALAMIN) 1000 MCG tablet Take 1 tablet (1,000 mcg total) by mouth daily. 06/17/17  Yes Ria Bush, MD  acetaminophen (TYLENOL) 325 MG tablet Take 2 tablets (650 mg total) by mouth every 4 (four) hours as needed for mild pain (or temp > 37.5 C (99.5 F)). 04/29/18   Angiulli, Lavon Paganini, PA-C  aspirin EC 325 MG EC tablet Take 1 tablet (325 mg total) by mouth daily. 08/29/18   Salary, Avel Peace, MD  nitroGLYCERIN (NITROSTAT) 0.4 MG SL tablet Place 1 tablet (0.4 mg total) under the tongue every 5 (five) minutes as needed for chest pain (max 3 doses in 15 minutes). 05/18/14   Minna Merritts, MD  polyethylene glycol powder (GLYCOLAX/MIRALAX) powder Take 17 g by mouth daily. 06/16/18   Ria Bush, MD  valACYclovir (VALTREX) 500 MG tablet Take 1 tablet (500 mg total) by mouth daily. 07/17/18   Ria Bush, MD     Allergies New skin and Tape  Family History  Problem Relation Age of Onset  . Hypertension Mother   . Heart disease Mother   . Hyperlipidemia Mother   . Hypertension Father   . Hyperlipidemia Father   . Kidney cancer Sister        Renal cell cancer  . Alcohol abuse Brother   . Diabetes  Brother   . Stroke Brother   . Heart attack Brother        MI  . Diabetes Other        Sister's daughter  . Depression Daughter        Bipolar    Social History Social History   Tobacco Use  . Smoking status: Former Smoker    Packs/day: 2.00    Years: 30.00    Pack years: 60.00    Last attempt to quit: 02/28/1979    Years since quitting: 39.5  . Smokeless tobacco: Never Used  . Tobacco comment: quit 1980  Substance Use Topics  . Alcohol use: No    Alcohol/week: 0.0 standard drinks    Comment: occasional  wine  . Drug use: Never    Review of Systems  Constitutional: No fever/chills Eyes: No visual changes.  ENT: Some difficulty swallowing Cardiovascular: Denies chest pain. Respiratory: Denies shortness of breath. Gastrointestinal: No abdominal pain.  No nausea, no vomiting.   Genitourinary: Negative for dysuria. Musculoskeletal: Negative for back pain. Skin: Negative for rash. Neurological: Negative for headaches, as above   ____________________________________________   PHYSICAL EXAM:  VITAL SIGNS: ED Triage Vitals  Enc Vitals Group     BP 08/27/18 1427 (!) 111/53     Pulse Rate 08/27/18 1427 86     Resp 08/27/18 1427 18     Temp 08/27/18 1427 97.6 F (36.4 C)     Temp Source 08/27/18 1427 Oral     SpO2 08/27/18 1427 96 %     Weight 08/27/18 1424 68 kg (150 lb)     Height 08/27/18 1424 1.765 m (5' 9.5")     Head Circumference --      Peak Flow --      Pain Score 08/27/18 1424 0     Pain Loc --      Pain Edu? --      Excl. in [place]? --     Constitutional: Alert and oriented.  Eyes: Conjunctivae are normal.   Nose: No congestion/rhinnorhea. Mouth/Throat: Mucous membranes are moist.    Cardiovascular: Normal rate, regular rhythm. Grossly normal heart sounds.  Good peripheral circulation. Respiratory: Normal respiratory effort.  No retractions. Lungs CTAB. Gastrointestinal: Soft and nontender. No distention.  No CVA tenderness.  Musculoskeletal:   Warm and well perfused Neurologic: Slurred speech noted, appears to be tolerating secretions well swallowing mechanism appears intact will have nurse perform a swallowing study Skin:  Skin is warm, dry and intact. No rash noted. Psychiatric: Mood and affect are normal. Speech and behavior are normal.  ____________________________________________   LABS (all labs ordered are listed, but only abnormal results are displayed)  Labs Reviewed  COMPREHENSIVE METABOLIC PANEL - Abnormal; Notable for the following components:      Result Value   Glucose, Bld 130 (*)    BUN 31 (*)    Creatinine, Ser 1.25 (*)    GFR calc non Af Amer 51 (*)    GFR calc Af Amer 60 (*)    All other components within normal limits  LIPID PANEL - Abnormal; Notable for the following components:   HDL 35 (*)    All other components within normal limits  CBC - Abnormal; Notable for the following components:   Platelets 129 (*)    All other components within normal limits  BASIC METABOLIC PANEL - Abnormal; Notable for the following components:   BUN 27 (*)    GFR calc non Af Amer 58 (*)    All other components within normal limits  SARS CORONAVIRUS 2 (HOSPITAL ORDER, [place] [facility])  PROTIME-INR  APTT  CBC  DIFFERENTIAL  HEMOGLOBIN A1C   ____________________________________________  EKG  ED ECG REPORT I, Lavonia Drafts, the attending physician, personally viewed and interpreted this ECG.  Date: 08/28/2018  Rhythm: normal sinus rhythm QRS Axis: normal Intervals: normal ST/T Wave abnormalities: normal Narrative Interpretation: no evidence of acute ischemia  ____________________________________________  RADIOLOGY  CT head unremarkable ____________________________________________   PROCEDURES  Procedure(s) performed: No  Procedures   Critical Care performed: No ____________________________________________   INITIAL IMPRESSION / ASSESSMENT AND PLAN / ED COURSE   Pertinent labs & imaging results that were available during my care of  the patient were reviewed by me and considered in my medical decision making (see chart for details).  Patient presents with slurred speech x2 days, some difficulty swallowing, certainly concerning for new CVA, outside the window for code stroke/TPA.  CT scan overall reassuring, lab work unremarkable.  Patient will require admission for further evaluation of possible CVA    ____________________________________________   FINAL CLINICAL IMPRESSION(S) / ED DIAGNOSES  Final diagnoses:  Cerebrovascular accident (CVA), unspecified mechanism ([place])        Note:  This document was prepared using Dragon voice recognition software and may include unintentional dictation errors.   Lavonia Drafts, MD 08/28/18 [id number]

## 2018-08-28 NOTE — Discharge Summary (Signed)
[facility] [place] NAME: Jimmy [name]    MR#:  [id number]  DATE OF BIRTH:  22-Apr-1928  DATE OF ADMISSION:  08/27/2018 ADMITTING PHYSICIAN: Sela Hua, MD  DATE OF DISCHARGE: No discharge date for patient encounter.  PRIMARY CARE PHYSICIAN: Ria Bush, MD    ADMISSION DIAGNOSIS:  Cerebrovascular accident (CVA), unspecified mechanism ([place]) [I63.9]  DISCHARGE DIAGNOSIS:  Active Problems:   Slurred speech   SECONDARY DIAGNOSIS:   Past Medical History:  Diagnosis Date  . CAD (coronary artery disease)    a. 04/2001 S/P CABGx 4;  b. 2008 MV:  EF 63% normal perfusion.  . Carotid stenosis    a. 11/2012 Carotid U/S:  RICA 03-54%, LICA 65-68%.  . Chronic kidney disease, stage 3 ([place])   . CLL (chronic lymphoblastic leukemia) 2009   Stage IV; Dr. Ivor Messier - referred to Dr. Lissa Merlin [facility] 07/2013 now on Rituxan (10/2013) --> stage 0 (09/2014)  . Diastolic CHF ([place])    a. 04/2749 Echo: EF 55-65%, mild conc LVH, Gr 1 DD, mild AS, triv AI, mildly dil Ao root (3.5 cm).  . GERD (gastroesophageal reflux disease) 1990s  . History of CVA (cerebrovascular accident) 2015   by MRI - remote L internal capsule  . History of herpes genitalis    valtrex daily  . Hyperlipemia 2002  . Hypertension 2002  . Mass of submandibular region 2015   referred to gen surg after chemo  . Mild aortic stenosis    a. 07/2011 Echo: Mild AS, triv AI.  [name] Stroke ([place])   . Thrombocytopenia ([place])    outpatient goals Hgb >9, plt >20  . TIA (transient ischemic attack)    04/2016    HOSPITAL COURSE:  *Acute recurrent left frontal CVA 8 mm in size  Admitted on our CVA protocol, aspirin increased to full dose, continue statin therapy, neurology did see patient while in house-deemed okay for discharge to home, MRA negative for any evidence of stenosis/obstruction, echocardiogram/carotid Dopplers done but results pending at the time of discharge, physical therapy  recommending home health PT/OT status post discharge as patient is ambulating with walker, follow-up with outpatient primary care provider and neurology for reevaluation status post discharge  *Hypertension [place] for permissive hypertension while in house continue home lisinopril  *CKD 3 at baseline  *Chronic diastolic congestive heart failure exacerbation Continue home regiment  *History of R carotid stenosis most recent Doppler carotid with 40-59% stenosis of the right ICA Plan of care as stated above    DISCHARGE CONDITIONS:   stable  CONSULTS OBTAINED:  Treatment Team:  Leotis Pain, MD  DRUG ALLERGIES:   Allergies  Allergen Reactions  . New Skin Other (See Comments)    unknown  . Tape Dermatitis    DISCHARGE MEDICATIONS:   Allergies as of 08/28/2018      Reactions   New Skin Other (See Comments)   unknown   Tape Dermatitis      Medication List    STOP taking these medications   aspirin 81 MG tablet Replaced by:  aspirin 325 MG EC tablet     TAKE these medications   acetaminophen 325 MG tablet Commonly known as:  TYLENOL Take 2 tablets (650 mg total) by mouth every 4 (four) hours as needed for mild pain (or temp > 37.5 C (99.5 F)).   allopurinol 300 MG tablet Commonly known as:  ZYLOPRIM Take 1 tablet (300 mg total) by mouth daily.  aspirin 325 MG EC tablet Take 1 tablet (325 mg total) by mouth daily. Start taking on:  Aug 29, 2018 Replaces:  aspirin 81 MG tablet   cholecalciferol 1000 units tablet Commonly known as:  VITAMIN D Take 2,000 Units by mouth daily.   lisinopril 5 MG tablet Commonly known as:  ZESTRIL Take 1 tablet (5 mg total) by mouth daily.   nitroGLYCERIN 0.4 MG SL tablet Commonly known as:  NITROSTAT Place 1 tablet (0.4 mg total) under the tongue every 5 (five) minutes as needed for chest pain (max 3 doses in 15 minutes).   polyethylene glycol powder 17 GM/SCOOP powder Commonly known as:  GLYCOLAX/MIRALAX Take 17  g by mouth daily.   simvastatin 10 MG tablet Commonly known as:  ZOCOR TAKE 1 TABLET AT BEDTIME What changed:  when to take this   valACYclovir 500 MG tablet Commonly known as:  VALTREX Take 1 tablet (500 mg total) by mouth daily.   vitamin B-12 1000 MCG tablet Commonly known as:  CYANOCOBALAMIN Take 1 tablet (1,000 mcg total) by mouth daily.        DISCHARGE INSTRUCTIONS:    If you experience worsening of your admission symptoms, develop shortness of breath, life threatening emergency, suicidal or homicidal thoughts you must seek medical attention immediately by calling 911 or calling your MD immediately  if symptoms less severe.  You Must read complete instructions/literature along with all the possible adverse reactions/side effects for all the Medicines you take and that have been prescribed to you. Take any new Medicines after you have completely understood and accept all the possible adverse reactions/side effects.   Please note  You were cared for by a hospitalist during your hospital stay. If you have any questions about your discharge medications or the care you received while you were in the hospital after you are discharged, you can call the unit and asked to speak with the hospitalist on call if the hospitalist that took care of you is not available. Once you are discharged, your primary care physician will handle any further medical issues. Please note that NO REFILLS for any discharge medications will be authorized once you are discharged, as it is imperative that you return to your primary care physician (or establish a relationship with a primary care physician if you do not have one) for your aftercare needs so that they can reassess your need for medications and monitor your lab values.    Today   CHIEF COMPLAINT:   Chief Complaint  Patient presents with  . Aphasia    HISTORY OF PRESENT ILLNESS:   85 y.o. male with a known history of CAD s/p CABG x4,  carotid stenosis, CKD 3, history CLL, chronic diastolic CHF, history of hemorrhagic CVA with left-sided weakness, hypertension, hyperlipidemia who presented to the ED with slurred speech over the last 2 days.  Patient also endorses difficulty swallowing.  He feels like he cannot control his saliva.  He denies any numbness, tingling, or weakness of his extremities compared to baseline (patient has residual left-sided weakness from previous stroke).  He denies any headache or vision changes.  In the ED, he was mildly hypertensive with blood pressures in the 160s/70s.  Labs were significant for creatinine 1.25.  CT head was negative.  COVID testing was negative.  Hospitalists were called for admission.   VITAL SIGNS:  Blood pressure (!) 145/81, pulse 70, temperature 98 F (36.7 C), temperature source Oral, resp. rate 16, height 5' 9.5" (1.765 m),  weight 68 kg, SpO2 98 %.  I/O:    Intake/Output Summary (Last 24 hours) at 08/28/2018 1201 Last data filed at 08/28/2018 0400 Gross per 24 hour  Intake 318.7 ml  Output -  Net 318.7 ml    PHYSICAL EXAMINATION:  GENERAL:  85 y.o.-year-old patient lying in the bed with no acute distress.  EYES: Pupils equal, round, reactive to light and accommodation. No scleral icterus. Extraocular muscles intact.  HEENT: Head atraumatic, normocephalic. Oropharynx and nasopharynx clear.  NECK:  Supple, no jugular venous distention. No thyroid enlargement, no tenderness.  LUNGS: Normal breath sounds bilaterally, no wheezing, rales,rhonchi or crepitation. No use of accessory muscles of respiration.  CARDIOVASCULAR: S1, S2 normal. No murmurs, rubs, or gallops.  ABDOMEN: Soft, non-tender, non-distended. Bowel sounds present. No organomegaly or mass.  EXTREMITIES: No pedal edema, cyanosis, or clubbing.  NEUROLOGIC: Cranial nerves II through XII are intact. Muscle strength 5/5 in all extremities. Sensation intact. Gait not checked.  PSYCHIATRIC: The patient is alert and  oriented x 3.  SKIN: No obvious rash, lesion, or ulcer.   DATA REVIEW:   CBC Recent Labs  Lab 08/28/18 0345  WBC 5.4  HGB 14.0  HCT 41.8  PLT 129*    Chemistries  Recent Labs  Lab 08/27/18 1436 08/28/18 0345  NA 142 141  K 3.8 3.5  CL 105 106  CO2 29 28  GLUCOSE 130* 97  BUN 31* 27*  CREATININE 1.25* 1.14  CALCIUM 9.8 9.2  AST 24  --   ALT 27  --   ALKPHOS 110  --   BILITOT 0.7  --     Cardiac Enzymes No results for input(s): TROPONINI in the last 168 hours.  Microbiology Results  Results for orders placed or performed during the hospital encounter of 08/27/18  SARS Coronavirus 2 (CEPHEID - Performed in [facility] hospital lab), Hosp Order     Status: None   Collection Time: 08/27/18  5:31 PM  Result Value Ref Range Status   SARS Coronavirus 2 NEGATIVE NEGATIVE Final    Comment: (NOTE) If result is NEGATIVE SARS-CoV-2 target nucleic acids are NOT DETECTED. The SARS-CoV-2 RNA is generally detectable in upper and lower  respiratory specimens during the acute phase of infection. The lowest  concentration of SARS-CoV-2 viral copies this assay can detect is 250  copies / mL. A negative result does not preclude SARS-CoV-2 infection  and should not be used as the sole basis for treatment or other  patient management decisions.  A negative result may occur with  improper specimen collection / handling, submission of specimen other  than nasopharyngeal swab, presence of viral mutation(s) within the  areas targeted by this assay, and inadequate number of viral copies  (<250 copies / mL). A negative result must be combined with clinical  observations, patient history, and epidemiological information. If result is POSITIVE SARS-CoV-2 target nucleic acids are DETECTED. The SARS-CoV-2 RNA is generally detectable in upper and lower  respiratory specimens dur ing the acute phase of infection.  Positive  results are indicative of active infection with SARS-CoV-2.   Clinical  correlation with patient history and other diagnostic information is  necessary to determine patient infection status.  Positive results do  not rule out bacterial infection or co-infection with other viruses. If result is PRESUMPTIVE POSTIVE SARS-CoV-2 nucleic acids MAY BE PRESENT.   A presumptive positive result was obtained on the submitted specimen  and confirmed on repeat testing.  While 2019 novel coronavirus  (  SARS-CoV-2) nucleic acids may be present in the submitted sample  additional confirmatory testing may be necessary for epidemiological  and / or clinical management purposes  to differentiate between  SARS-CoV-2 and other Sarbecovirus currently known to infect humans.  If clinically indicated additional testing with an alternate test  methodology [phone]) is advised. The SARS-CoV-2 RNA is generally  detectable in upper and lower respiratory sp ecimens during the acute  phase of infection. The expected result is Negative. Fact Sheet for Patients:  StrictlyIdeas.no Fact Sheet for Healthcare Providers: BankingDealers.co.za This test is not yet approved or cleared by the [place] FDA and has been authorized for detection and/or diagnosis of SARS-CoV-2 by FDA under an Emergency Use Authorization (EUA).  This EUA will remain in effect (meaning this test can be used) for the duration of the COVID-19 declaration under Section 564(b)(1) of the Act, 21 U.S.C. section 360bbb-3(b)(1), unless the authorization is terminated or revoked sooner. Performed at [facility], [address], [place], [place] [address]     RADIOLOGY:  Ct Head Wo Contrast  Result Date: 08/27/2018 CLINICAL DATA:  Slurred speech for 2 days. EXAM: CT HEAD WITHOUT CONTRAST TECHNIQUE: Contiguous axial images were obtained from the base of the skull through the vertex without intravenous contrast. COMPARISON:  04/05/2018. FINDINGS: Brain: No  evidence for acute infarction, hemorrhage, mass lesion, hydrocephalus, or extra-axial fluid. Generalized atrophy. Advanced hypoattenuation of white matter, consistent with small vessel disease. Numerous areas of prior brain insult, including ischemic lacunar infarct LEFT basal ganglia, and previous RIGHT basal ganglia/centrum semiovale hemorrhage. Vascular: Calcification of the cavernous internal carotid arteries consistent with cerebrovascular atherosclerotic disease. No signs of intracranial large vessel occlusion. Skull: Calvarium intact. Sinuses/Orbits: No acute finding. Other: None. IMPRESSION: Atrophy and small vessel disease. No acute intracranial findings. Resolved RIGHT basal ganglia/centrum semiovale hemorrhage. Electronically Signed   By: Staci Righter M.D.   On: 08/27/2018 14:54   Mr Brain Wo Contrast  Result Date: 08/27/2018 CLINICAL DATA:  Initial evaluation for acute slurred speech. EXAM: MRI HEAD WITHOUT CONTRAST MRA HEAD WITHOUT CONTRAST TECHNIQUE: Multiplanar, multiecho pulse sequences of the brain and surrounding structures were obtained without intravenous contrast. Angiographic images of the head were obtained using MRA technique without contrast. COMPARISON:  Prior CT from earlier the same day as well as previous MRI from 04/04/2018 FINDINGS: MRI HEAD FINDINGS Brain: Generalized age-related cerebral atrophy with advanced chronic microvascular ischemic disease. Chronic hemorrhagic right basal ganglia lacunar infarct noted. Additional chronic lacunar infarcts noted involving the left basal ganglia, left thalamus, and pons. 8 mm acute ischemic nonhemorrhagic infarct seen involving the subcortical left frontal white matter (series 2, image 39). No other evidence for acute or subacute ischemia. Gray-white matter differentiation otherwise maintained. No acute intracranial hemorrhage. Additional subcentimeter chronic microhemorrhage noted within the pons. No mass lesion, midline shift or mass  effect. No hydrocephalus. No extra-axial fluid collection. Pituitary gland within normal limits. Midline structures intact. Vascular: Major intracranial vascular flow voids maintained. Skull and upper cervical spine: Craniocervical junction normal. Bone marrow signal intensity within normal limits. No scalp soft tissue abnormality. Sinuses/Orbits: Patient status post bilateral ocular lens replacement. Paranasal sinuses are clear. Trace right mastoid effusion noted, of doubtful significance. Inner ear structures normal. Other: None. MRA HEAD FINDINGS ANTERIOR CIRCULATION: Distal cervical segments of the internal carotid arteries are patent with symmetric antegrade flow. Petrous segments widely patent. Scattered atheromatous irregularity within the cavernous/supraclinoid ICAs without hemodynamically significant stenosis. A1 segments patent bilaterally. Right A1 slightly hypoplastic, accounting for  the slightly diminutive right ICA is compared to the left. Anterior communicating artery within normal limits. Anterior cerebral arteries patent to their distal aspects without stenosis. M1 segments widely patent bilaterally. Distal MCA branches well perfused and symmetric. POSTERIOR CIRCULATION: Vertebral arteries widely patent to the vertebrobasilar junction. Right vertebral artery slightly dominant. Left PICA patent proximally. Right PICA not well seen. Basilar widely patent to its distal aspect. Superior cerebral arteries patent bilaterally. Both of the posterior cerebral arteries primarily supplied via the basilar interval perfused to their distal aspects. Short-segment mild to moderate bilateral P2 stenoses noted. No intracranial aneurysm. IMPRESSION: MRI HEAD IMPRESSION: 1. 8 mm acute ischemic nonhemorrhagic infarct involving the subcortical left frontal white matter. 2. Multifocal chronic infarcts involving the bilateral basal ganglia, left thalamus, and pons. 3. Underlying age-related cerebral atrophy with advanced  chronic microvascular ischemic disease. MRA HEAD IMPRESSION: Negative intracranial MRA with no large vessel occlusion or hemodynamically significant or correctable stenosis. Electronically Signed   By: Jeannine Boga M.D.   On: 08/27/2018 22:27   Mr Jodene Nam Head/brain OE Cm  Result Date: 08/27/2018 CLINICAL DATA:  Initial evaluation for acute slurred speech. EXAM: MRI HEAD WITHOUT CONTRAST MRA HEAD WITHOUT CONTRAST TECHNIQUE: Multiplanar, multiecho pulse sequences of the brain and surrounding structures were obtained without intravenous contrast. Angiographic images of the head were obtained using MRA technique without contrast. COMPARISON:  Prior CT from earlier the same day as well as previous MRI from 04/04/2018 FINDINGS: MRI HEAD FINDINGS Brain: Generalized age-related cerebral atrophy with advanced chronic microvascular ischemic disease. Chronic hemorrhagic right basal ganglia lacunar infarct noted. Additional chronic lacunar infarcts noted involving the left basal ganglia, left thalamus, and pons. 8 mm acute ischemic nonhemorrhagic infarct seen involving the subcortical left frontal white matter (series 2, image 39). No other evidence for acute or subacute ischemia. Gray-white matter differentiation otherwise maintained. No acute intracranial hemorrhage. Additional subcentimeter chronic microhemorrhage noted within the pons. No mass lesion, midline shift or mass effect. No hydrocephalus. No extra-axial fluid collection. Pituitary gland within normal limits. Midline structures intact. Vascular: Major intracranial vascular flow voids maintained. Skull and upper cervical spine: Craniocervical junction normal. Bone marrow signal intensity within normal limits. No scalp soft tissue abnormality. Sinuses/Orbits: Patient status post bilateral ocular lens replacement. Paranasal sinuses are clear. Trace right mastoid effusion noted, of doubtful significance. Inner ear structures normal. Other: None. MRA HEAD  FINDINGS ANTERIOR CIRCULATION: Distal cervical segments of the internal carotid arteries are patent with symmetric antegrade flow. Petrous segments widely patent. Scattered atheromatous irregularity within the cavernous/supraclinoid ICAs without hemodynamically significant stenosis. A1 segments patent bilaterally. Right A1 slightly hypoplastic, accounting for the slightly diminutive right ICA is compared to the left. Anterior communicating artery within normal limits. Anterior cerebral arteries patent to their distal aspects without stenosis. M1 segments widely patent bilaterally. Distal MCA branches well perfused and symmetric. POSTERIOR CIRCULATION: Vertebral arteries widely patent to the vertebrobasilar junction. Right vertebral artery slightly dominant. Left PICA patent proximally. Right PICA not well seen. Basilar widely patent to its distal aspect. Superior cerebral arteries patent bilaterally. Both of the posterior cerebral arteries primarily supplied via the basilar interval perfused to their distal aspects. Short-segment mild to moderate bilateral P2 stenoses noted. No intracranial aneurysm. IMPRESSION: MRI HEAD IMPRESSION: 1. 8 mm acute ischemic nonhemorrhagic infarct involving the subcortical left frontal white matter. 2. Multifocal chronic infarcts involving the bilateral basal ganglia, left thalamus, and pons. 3. Underlying age-related cerebral atrophy with advanced chronic microvascular ischemic disease. MRA HEAD IMPRESSION: Negative intracranial MRA with  no large vessel occlusion or hemodynamically significant or correctable stenosis. Electronically Signed   By: Jeannine Boga M.D.   On: 08/27/2018 22:27    EKG:   Orders placed or performed during the hospital encounter of 08/27/18  . EKG 12-Lead  . EKG 12-Lead  . ED EKG  . ED EKG      Management plans discussed with the patient, family and they are in agreement.  CODE STATUS:     Code Status Orders  (From admission, onward)          Start     Ordered   08/27/18 2047  Limited resuscitation (code)  Continuous    Question Answer Comment  In the event of cardiac or respiratory ARREST: Initiate Code Blue, Call Rapid Response Yes   In the event of cardiac or respiratory ARREST: Perform CPR Yes   In the event of cardiac or respiratory ARREST: Perform Intubation/Mechanical Ventilation No   In the event of cardiac or respiratory ARREST: Use NIPPV/BiPAp only if indicated Yes   In the event of cardiac or respiratory ARREST: Administer ACLS medications if indicated Yes   In the event of cardiac or respiratory ARREST: Perform Defibrillation or Cardioversion if indicated Yes      08/27/18 2046        Code Status History    Date Active Date Inactive Code Status Order ID Comments User Context   04/07/2018 1640 04/29/2018 1708 Full Code [id number]  Elizabeth Sauer Inpatient   04/07/2018 1640 04/07/2018 1640 Full Code [id number]  Elizabeth Sauer Inpatient   04/04/2018 0215 04/07/2018 1625 Full Code [id number]  Kerney Elbe, MD ED   04/08/2016 1850 04/09/2016 2329 Full Code [id number]  Lavina Hamman, MD Inpatient    Advance Directive Documentation     Most Recent Value  Type of Advance Directive  Healthcare Power of [place], Living will  Pre-existing out of facility DNR order (yellow form or pink MOST form)  -  "MOST" Form in Place?  -      TOTAL TIME TAKING CARE OF THIS PATIENT: 40 minutes.    Avel Peace Salary M.D on 08/28/2018 at 12:01 PM  Between 7am to 6pm - Pager - [phone]  After 6pm go to [email or web address] - password [email or web address] [facility]  Office  [phone]  CC: Primary care physician; Ria Bush, MD   Note: This dictation was prepared with Dragon dictation along with smaller phrase technology. Any transcriptional errors that result from this process are unintentional.

## 2018-08-28 NOTE — Progress Notes (Signed)
*  PRELIMINARY RESULTS* Echocardiogram 2D Echocardiogram has been performed.  [place] 08/28/2018, 11:40 AM

## 2018-08-28 NOTE — Consult Note (Signed)
Reason for Consult:difficulty swallowing  Referring Physician: Dr. Jerelyn Charles   CC: Difficulty swallowing   HPI: Jimmy [name] is an 85 y.o. male with a known history of CAD s/p CABG x4, carotid stenosis, CKD 3, history CLL, chronic diastolic CHF, history of hemorrhagic CVA with left-sided weakness, hypertension, hyperlipidemia who presented to the ED with slurred speech over the last 2 days.  Patient also endorses difficulty swallowing.  Slurred speech and swallowing has improved.  CTH Pt has L frontal small stroke.    Past Medical History:  Diagnosis Date  . CAD (coronary artery disease)    a. 04/2001 S/P CABGx 4;  b. 2008 MV:  EF 63% normal perfusion.  . Carotid stenosis    a. 11/2012 Carotid U/S:  RICA 46-27%, LICA 03-50%.  . Chronic kidney disease, stage 3 ([place])   . CLL (chronic lymphoblastic leukemia) 2009   Stage IV; Dr. Ivor Messier - referred to Dr. Lissa Merlin [facility] 07/2013 now on Rituxan (10/2013) --> stage 0 (09/2014)  . Diastolic CHF ([place])    a. 0/9381 Echo: EF 55-65%, mild conc LVH, Gr 1 DD, mild AS, triv AI, mildly dil Ao root (3.5 cm).  . GERD (gastroesophageal reflux disease) 1990s  . History of CVA (cerebrovascular accident) 2015   by MRI - remote L internal capsule  . History of herpes genitalis    valtrex daily  . Hyperlipemia 2002  . Hypertension 2002  . Mass of submandibular region 2015   referred to gen surg after chemo  . Mild aortic stenosis    a. 07/2011 Echo: Mild AS, triv AI.  [name] Stroke ([place])   . Thrombocytopenia ([place])    outpatient goals Hgb >9, plt >20  . TIA (transient ischemic attack)    04/2016    Past Surgical History:  Procedure Laterality Date  . ANGIOPLASTY  1998  . CATARACT EXTRACTION, BILATERAL     R 1/09, L 8/09  . COLONOSCOPY  11/29/1987   Normal  . COLONOSCOPY  02/07/2003   Hemm. Internal, focal proctitis, negative biopsy  . COLONOSCOPY  12/12/2004   Internal external hemorrhoids, +proctits, negative biopsy  . CORONARY ANGIOPLASTY  1998  .  CORONARY ARTERY BYPASS GRAFT  04/23/2001   x4, Dr. Pia Mau  . ESOPHAGOGASTRODUODENOSCOPY  11/29/1987   Gastritis  . ETT  12/10/2006   Persantine myoview nml  . HEMORROIDECTOMY  1954   Fissure repair, [place]  . HEPATIC ARTERY ANGIOPLASTY  1954   [place], [place]  . KIDNEY STONE SURGERY  1977   Dr Redmond Baseman  . LITHOTRIPSY  1990s   Multiple  . US ECHOCARDIOGRAPHY  07/2011   nl sys fxn, EF 55-60%, grade 1 diast dysfunction, mild AS, mildly elevated PA pressure    Family History  Problem Relation Age of Onset  . Hypertension Mother   . Heart disease Mother   . Hyperlipidemia Mother   . Hypertension Father   . Hyperlipidemia Father   . Kidney cancer Sister        Renal cell cancer  . Alcohol abuse Brother   . Diabetes Brother   . Stroke Brother   . Heart attack Brother        MI  . Diabetes Other        Sister's daughter  . Depression Daughter        Bipolar    Social History:  reports that he quit smoking about 39 years ago. He has a 60.00 pack-year smoking history. He has never used smokeless tobacco.  He reports that he does not drink alcohol or use drugs.  Allergies  Allergen Reactions  . New Skin Other (See Comments)    unknown  . Tape Dermatitis    Medications: I have reviewed the patient's current medications.  ROS: History obtained from the patient  General ROS: negative for - chills, fatigue, fever, night sweats, weight gain or weight loss Psychological ROS: negative for - behavioral disorder, hallucinations, memory difficulties, mood swings or suicidal ideation Ophthalmic ROS: negative for - blurry vision, double vision, eye pain or loss of vision ENT ROS: negative for - epistaxis, nasal discharge, oral lesions, sore throat, tinnitus or vertigo Allergy and Immunology ROS: negative for - hives or itchy/watery eyes Hematological and Lymphatic ROS: negative for - bleeding problems, bruising or swollen lymph nodes Endocrine ROS: negative for - galactorrhea, hair  pattern changes, polydipsia/polyuria or temperature intolerance Respiratory ROS: negative for - cough, hemoptysis, shortness of breath or wheezing Cardiovascular ROS: cabg Gastrointestinal ROS: negative for - abdominal pain, diarrhea, hematemesis, nausea/vomiting or stool incontinence Genito-Urinary ROS: negative for - dysuria, hematuria, incontinence or urinary frequency/urgency Musculoskeletal ROS: negative for - joint swelling or muscular weakness Neurological ROS: as noted in HPI Dermatological ROS: negative for rash and skin lesion changes  Physical Examination: Blood pressure (!) 146/65, pulse 61, temperature 97.7 F (36.5 C), temperature source Oral, resp. rate 16, height 5' 9.5" (1.765 m), weight 68 kg, SpO2 100 %.    Neurological Examination   Mental Status: Alert, oriented, thought content appropriate.  Speech fluent without evidence of aphasia.  Able to follow 3 step commands without difficulty. Cranial Nerves: II: Discs flat bilaterally; Visual fields grossly normal, pupils equal, round, reactive to light and accommodation III,IV, VI: ptosis not present, extra-ocular motions intact bilaterally V,VII: smile symmetric, facial light touch sensation normal bilaterally VIII: hearing normal bilaterally IX,X: gag reflex present XI: bilateral shoulder shrug XII: midline tongue extension Motor: chronic minor L sided weakness  Tone and bulk:normal tone throughout; no atrophy noted Sensory: Pinprick and light touch intact throughout, bilaterally Deep Tendon Reflexes: 1+ and symmetric throughout Plantars: Right: downgoing   Left: downgoing Cerebellar: Not tested Gait: not tested      Laboratory Studies:   Basic Metabolic Panel: Recent Labs  Lab 08/27/18 1436 08/28/18 0345  NA 142 141  K 3.8 3.5  CL 105 106  CO2 29 28  GLUCOSE 130* 97  BUN 31* 27*  CREATININE 1.25* 1.14  CALCIUM 9.8 9.2    Liver Function Tests: Recent Labs  Lab 08/27/18 1436  AST 24  ALT 27   ALKPHOS 110  BILITOT 0.7  PROT 7.3  ALBUMIN 4.2   No results for input(s): LIPASE, AMYLASE in the last 168 hours. No results for input(s): AMMONIA in the last 168 hours.  CBC: Recent Labs  Lab 08/27/18 1436 08/28/18 0345  WBC 7.2 5.4  NEUTROABS 5.1  --   HGB 15.0 14.0  HCT 44.5 41.8  MCV 93.9 91.9  PLT 156 129*    Cardiac Enzymes: No results for input(s): CKTOTAL, CKMB, CKMBINDEX, TROPONINI in the last 168 hours.  BNP: Invalid input(s): POCBNP  CBG: No results for input(s): GLUCAP in the last 168 hours.  Microbiology: Results for orders placed or performed during the hospital encounter of 08/27/18  SARS Coronavirus 2 (CEPHEID - Performed in [facility] hospital lab), [facility] Order     Status: None   Collection Time: 08/27/18  5:31 PM  Result Value Ref Range Status   SARS Coronavirus 2 NEGATIVE  NEGATIVE Final    Comment: (NOTE) If result is NEGATIVE SARS-CoV-2 target nucleic acids are NOT DETECTED. The SARS-CoV-2 RNA is generally detectable in upper and lower  respiratory specimens during the acute phase of infection. The lowest  concentration of SARS-CoV-2 viral copies this assay can detect is 250  copies / mL. A negative result does not preclude SARS-CoV-2 infection  and should not be used as the sole basis for treatment or other  patient management decisions.  A negative result may occur with  improper specimen collection / handling, submission of specimen other  than nasopharyngeal swab, presence of viral mutation(s) within the  areas targeted by this assay, and inadequate number of viral copies  (<250 copies / mL). A negative result must be combined with clinical  observations, patient history, and epidemiological information. If result is POSITIVE SARS-CoV-2 target nucleic acids are DETECTED. The SARS-CoV-2 RNA is generally detectable in upper and lower  respiratory specimens dur ing the acute phase of infection.  Positive  results are indicative of active  infection with SARS-CoV-2.  Clinical  correlation with patient history and other diagnostic information is  necessary to determine patient infection status.  Positive results do  not rule out bacterial infection or co-infection with other viruses. If result is PRESUMPTIVE POSTIVE SARS-CoV-2 nucleic acids MAY BE PRESENT.   A presumptive positive result was obtained on the submitted specimen  and confirmed on repeat testing.  While 2019 novel coronavirus  (SARS-CoV-2) nucleic acids may be present in the submitted sample  additional confirmatory testing may be necessary for epidemiological  and / or clinical management purposes  to differentiate between  SARS-CoV-2 and other Sarbecovirus currently known to infect humans.  If clinically indicated additional testing with an alternate test  methodology [phone]) is advised. The SARS-CoV-2 RNA is generally  detectable in upper and lower respiratory sp ecimens during the acute  phase of infection. The expected result is Negative. Fact Sheet for Patients:  StrictlyIdeas.no Fact Sheet for Healthcare Providers: BankingDealers.co.za This test is not yet approved or cleared by the [place] FDA and has been authorized for detection and/or diagnosis of SARS-CoV-2 by FDA under an Emergency Use Authorization (EUA).  This EUA will remain in effect (meaning this test can be used) for the duration of the COVID-19 declaration under Section 564(b)(1) of the Act, 21 U.S.C. section 360bbb-3(b)(1), unless the authorization is terminated or revoked sooner. Performed at [facility], [address] [place], [place] [address]     Coagulation Studies: Recent Labs    08/27/18 1436  LABPROT 13.3  INR 1.0    Urinalysis: No results for input(s): COLORURINE, LABSPEC, PHURINE, GLUCOSEU, HGBUR, BILIRUBINUR, KETONESUR, PROTEINUR, UROBILINOGEN, NITRITE, LEUKOCYTESUR in the last 168 hours.  Invalid  input(s): APPERANCEUR  Lipid Panel:     Component Value Date/Time   CHOL 118 08/28/2018 0345   TRIG 135 08/28/2018 0345   HDL 35 (L) 08/28/2018 0345   CHOLHDL 3.4 08/28/2018 0345   VLDL 27 08/28/2018 0345   LDLCALC 56 08/28/2018 0345    HgbA1C:  Lab Results  Component Value Date   HGBA1C 5.6 06/16/2018    Urine Drug Screen:      Component Value Date/Time   LABOPIA NONE DETECTED 04/08/2016 1500   COCAINSCRNUR NONE DETECTED 04/08/2016 1500   LABBENZ NONE DETECTED 04/08/2016 1500   AMPHETMU NONE DETECTED 04/08/2016 1500   THCU NONE DETECTED 04/08/2016 1500   LABBARB NONE DETECTED 04/08/2016 1500    Alcohol Level: No results for  input(s): ETH in the last 168 hours.  Other results: EKG: normal EKG, normal sinus rhythm, unchanged from previous tracings.  Imaging: Ct Head Wo Contrast  Result Date: 08/27/2018 CLINICAL DATA:  Slurred speech for 2 days. EXAM: CT HEAD WITHOUT CONTRAST TECHNIQUE: Contiguous axial images were obtained from the base of the skull through the vertex without intravenous contrast. COMPARISON:  04/05/2018. FINDINGS: Brain: No evidence for acute infarction, hemorrhage, mass lesion, hydrocephalus, or extra-axial fluid. Generalized atrophy. Advanced hypoattenuation of white matter, consistent with small vessel disease. Numerous areas of prior brain insult, including ischemic lacunar infarct LEFT basal ganglia, and previous RIGHT basal ganglia/centrum semiovale hemorrhage. Vascular: Calcification of the cavernous internal carotid arteries consistent with cerebrovascular atherosclerotic disease. No signs of intracranial large vessel occlusion. Skull: Calvarium intact. Sinuses/Orbits: No acute finding. Other: None. IMPRESSION: Atrophy and small vessel disease. No acute intracranial findings. Resolved RIGHT basal ganglia/centrum semiovale hemorrhage. Electronically Signed   By: Staci Righter M.D.   On: 08/27/2018 14:54   Mr Brain Wo Contrast  Result Date:  08/27/2018 CLINICAL DATA:  Initial evaluation for acute slurred speech. EXAM: MRI HEAD WITHOUT CONTRAST MRA HEAD WITHOUT CONTRAST TECHNIQUE: Multiplanar, multiecho pulse sequences of the brain and surrounding structures were obtained without intravenous contrast. Angiographic images of the head were obtained using MRA technique without contrast. COMPARISON:  Prior CT from earlier the same day as well as previous MRI from 04/04/2018 FINDINGS: MRI HEAD FINDINGS Brain: Generalized age-related cerebral atrophy with advanced chronic microvascular ischemic disease. Chronic hemorrhagic right basal ganglia lacunar infarct noted. Additional chronic lacunar infarcts noted involving the left basal ganglia, left thalamus, and pons. 8 mm acute ischemic nonhemorrhagic infarct seen involving the subcortical left frontal white matter (series 2, image 39). No other evidence for acute or subacute ischemia. Gray-white matter differentiation otherwise maintained. No acute intracranial hemorrhage. Additional subcentimeter chronic microhemorrhage noted within the pons. No mass lesion, midline shift or mass effect. No hydrocephalus. No extra-axial fluid collection. Pituitary gland within normal limits. Midline structures intact. Vascular: Major intracranial vascular flow voids maintained. Skull and upper cervical spine: Craniocervical junction normal. Bone marrow signal intensity within normal limits. No scalp soft tissue abnormality. Sinuses/Orbits: Patient status post bilateral ocular lens replacement. Paranasal sinuses are clear. Trace right mastoid effusion noted, of doubtful significance. Inner ear structures normal. Other: None. MRA HEAD FINDINGS ANTERIOR CIRCULATION: Distal cervical segments of the internal carotid arteries are patent with symmetric antegrade flow. Petrous segments widely patent. Scattered atheromatous irregularity within the cavernous/supraclinoid ICAs without hemodynamically significant stenosis. A1 segments  patent bilaterally. Right A1 slightly hypoplastic, accounting for the slightly diminutive right ICA is compared to the left. Anterior communicating artery within normal limits. Anterior cerebral arteries patent to their distal aspects without stenosis. M1 segments widely patent bilaterally. Distal MCA branches well perfused and symmetric. POSTERIOR CIRCULATION: Vertebral arteries widely patent to the vertebrobasilar junction. Right vertebral artery slightly dominant. Left PICA patent proximally. Right PICA not well seen. Basilar widely patent to its distal aspect. Superior cerebral arteries patent bilaterally. Both of the posterior cerebral arteries primarily supplied via the basilar interval perfused to their distal aspects. Short-segment mild to moderate bilateral P2 stenoses noted. No intracranial aneurysm. IMPRESSION: MRI HEAD IMPRESSION: 1. 8 mm acute ischemic nonhemorrhagic infarct involving the subcortical left frontal white matter. 2. Multifocal chronic infarcts involving the bilateral basal ganglia, left thalamus, and pons. 3. Underlying age-related cerebral atrophy with advanced chronic microvascular ischemic disease. MRA HEAD IMPRESSION: Negative intracranial MRA with no large vessel occlusion or hemodynamically significant  or correctable stenosis. Electronically Signed   By: Jeannine Boga M.D.   On: 08/27/2018 22:27   US Carotid Bilateral (at [facility] And [facility] Only)  Result Date: 08/28/2018 CLINICAL DATA:  Slurred speech, hypertension EXAM: BILATERAL CAROTID DUPLEX ULTRASOUND TECHNIQUE: Pearline Cables scale imaging, color Doppler and duplex ultrasound were performed of bilateral carotid and vertebral arteries in the neck. COMPARISON:  None. FINDINGS: Criteria: Quantification of carotid stenosis is based on velocity parameters that correlate the residual internal carotid diameter with NASCET-based stenosis levels, using the diameter of the distal internal carotid lumen as the denominator for stenosis  measurement. The following velocity measurements were obtained: RIGHT ICA: 187/36 cm/sec CCA: 70/62 cm/sec SYSTOLIC ICA/CCA RATIO:  2.9 ECA: 215 cm/sec LEFT ICA: 93/26 cm/sec CCA: 37/6 cm/sec SYSTOLIC ICA/CCA RATIO:  1.2 ECA: 80 cm/sec RIGHT CAROTID ARTERY: Moderate intimal thickening and heterogeneous calcified right carotid bifurcation atherosclerosis. This narrows the proximal ICA lumen by grayscale imaging. In this region there is mild velocity elevation measuring 187/36 centimeters/second with mild turbulent flow. Degree of stenosis estimated at 50-69% by ultrasound criteria. Mild ECA origin stenosis also noted. RIGHT VERTEBRAL ARTERY:  Antegrade LEFT CAROTID ARTERY: Similar scattered minor echogenic plaque formation. No hemodynamically significant left ICA stenosis, velocity elevation, or turbulent flow. LEFT VERTEBRAL ARTERY:  Antegrade IMPRESSION: Moderate right ICA stenosis estimated at 50-69% by ultrasound criteria. Minor left ICA narrowing, less than 50% Patent antegrade vertebral flow bilaterally Electronically Signed   By: Jerilynn Mages.  Shick M.D.   On: 08/28/2018 12:12   Mr Jodene Nam Head/brain EG Cm  Result Date: 08/27/2018 CLINICAL DATA:  Initial evaluation for acute slurred speech. EXAM: MRI HEAD WITHOUT CONTRAST MRA HEAD WITHOUT CONTRAST TECHNIQUE: Multiplanar, multiecho pulse sequences of the brain and surrounding structures were obtained without intravenous contrast. Angiographic images of the head were obtained using MRA technique without contrast. COMPARISON:  Prior CT from earlier the same day as well as previous MRI from 04/04/2018 FINDINGS: MRI HEAD FINDINGS Brain: Generalized age-related cerebral atrophy with advanced chronic microvascular ischemic disease. Chronic hemorrhagic right basal ganglia lacunar infarct noted. Additional chronic lacunar infarcts noted involving the left basal ganglia, left thalamus, and pons. 8 mm acute ischemic nonhemorrhagic infarct seen involving the subcortical left  frontal white matter (series 2, image 39). No other evidence for acute or subacute ischemia. Gray-white matter differentiation otherwise maintained. No acute intracranial hemorrhage. Additional subcentimeter chronic microhemorrhage noted within the pons. No mass lesion, midline shift or mass effect. No hydrocephalus. No extra-axial fluid collection. Pituitary gland within normal limits. Midline structures intact. Vascular: Major intracranial vascular flow voids maintained. Skull and upper cervical spine: Craniocervical junction normal. Bone marrow signal intensity within normal limits. No scalp soft tissue abnormality. Sinuses/Orbits: Patient status post bilateral ocular lens replacement. Paranasal sinuses are clear. Trace right mastoid effusion noted, of doubtful significance. Inner ear structures normal. Other: None. MRA HEAD FINDINGS ANTERIOR CIRCULATION: Distal cervical segments of the internal carotid arteries are patent with symmetric antegrade flow. Petrous segments widely patent. Scattered atheromatous irregularity within the cavernous/supraclinoid ICAs without hemodynamically significant stenosis. A1 segments patent bilaterally. Right A1 slightly hypoplastic, accounting for the slightly diminutive right ICA is compared to the left. Anterior communicating artery within normal limits. Anterior cerebral arteries patent to their distal aspects without stenosis. M1 segments widely patent bilaterally. Distal MCA branches well perfused and symmetric. POSTERIOR CIRCULATION: Vertebral arteries widely patent to the vertebrobasilar junction. Right vertebral artery slightly dominant. Left PICA patent proximally. Right PICA not well seen. Basilar widely patent to its distal aspect.  Superior cerebral arteries patent bilaterally. Both of the posterior cerebral arteries primarily supplied via the basilar interval perfused to their distal aspects. Short-segment mild to moderate bilateral P2 stenoses noted. No intracranial  aneurysm. IMPRESSION: MRI HEAD IMPRESSION: 1. 8 mm acute ischemic nonhemorrhagic infarct involving the subcortical left frontal white matter. 2. Multifocal chronic infarcts involving the bilateral basal ganglia, left thalamus, and pons. 3. Underlying age-related cerebral atrophy with advanced chronic microvascular ischemic disease. MRA HEAD IMPRESSION: Negative intracranial MRA with no large vessel occlusion or hemodynamically significant or correctable stenosis. Electronically Signed   By: Jeannine Boga M.D.   On: 08/27/2018 22:27     Assessment/Plan:  85 y.o. male with a known history of CAD s/p CABG x4, carotid stenosis, CKD 3, history CLL, chronic diastolic CHF, history of hemorrhagic CVA with left-sided weakness, hypertension, hyperlipidemia who presented to the ED with slurred speech over the last 2 days.  Patient also endorses difficulty swallowing.  Slurred speech and swallowing has improved.    - MRI with L frontal stroke taht is small in size - MRA no acute abnormality - Pt was on ASA 325 as no significant intracranial stenosis pt is to con't ASA 325 daily and statin - 2decho - likely d/c today  Jeramyah Goodpasture   08/28/2018, 12:57 PM

## 2018-08-29 LAB — HEMOGLOBIN A1C
Hgb A1c MFr Bld: 5.6 % (ref 4.8–5.6)
Mean Plasma Glucose: 114.02 mg/dL

## 2018-08-30 DIAGNOSIS — I130 Hypertensive heart and chronic kidney disease with heart failure and stage 1 through stage 4 chronic kidney disease, or unspecified chronic kidney disease: Secondary | ICD-10-CM | POA: Diagnosis not present

## 2018-08-30 DIAGNOSIS — I69328 Other speech and language deficits following cerebral infarction: Secondary | ICD-10-CM | POA: Diagnosis not present

## 2018-08-30 DIAGNOSIS — Z87891 Personal history of nicotine dependence: Secondary | ICD-10-CM | POA: Diagnosis not present

## 2018-08-30 DIAGNOSIS — Z951 Presence of aortocoronary bypass graft: Secondary | ICD-10-CM | POA: Diagnosis not present

## 2018-08-30 DIAGNOSIS — N183 Chronic kidney disease, stage 3 (moderate): Secondary | ICD-10-CM | POA: Diagnosis not present

## 2018-08-30 DIAGNOSIS — I69354 Hemiplegia and hemiparesis following cerebral infarction affecting left non-dominant side: Secondary | ICD-10-CM | POA: Diagnosis not present

## 2018-08-30 DIAGNOSIS — I5032 Chronic diastolic (congestive) heart failure: Secondary | ICD-10-CM | POA: Diagnosis not present

## 2018-08-31 ENCOUNTER — Other Ambulatory Visit: Payer: Self-pay

## 2018-08-31 ENCOUNTER — Telehealth: Payer: Self-pay

## 2018-08-31 DIAGNOSIS — I5032 Chronic diastolic (congestive) heart failure: Secondary | ICD-10-CM | POA: Diagnosis not present

## 2018-08-31 DIAGNOSIS — I130 Hypertensive heart and chronic kidney disease with heart failure and stage 1 through stage 4 chronic kidney disease, or unspecified chronic kidney disease: Secondary | ICD-10-CM | POA: Diagnosis not present

## 2018-08-31 DIAGNOSIS — N183 Chronic kidney disease, stage 3 (moderate): Secondary | ICD-10-CM | POA: Diagnosis not present

## 2018-08-31 DIAGNOSIS — Z951 Presence of aortocoronary bypass graft: Secondary | ICD-10-CM | POA: Diagnosis not present

## 2018-08-31 DIAGNOSIS — I69354 Hemiplegia and hemiparesis following cerebral infarction affecting left non-dominant side: Secondary | ICD-10-CM | POA: Diagnosis not present

## 2018-08-31 DIAGNOSIS — I69328 Other speech and language deficits following cerebral infarction: Secondary | ICD-10-CM | POA: Diagnosis not present

## 2018-08-31 NOTE — Telephone Encounter (Signed)
Ria Comment from [facility] called for verbal orders for skilled nursing 2 x week for 2 weeks, 1 x week for 4 weeks, ans once every other week for 3 weeks. Home health aide 2 x week for 5 weeks. Speech Therapy eval for difficulty swallowing.  Call [phone]. Can leave a voicemail.

## 2018-08-31 NOTE — Patient Outreach (Signed)
[facility]) Care Management  08/31/2018  Alphonzo Devera [facility] Apr 15, 1928 [id number]   EMMI- General Discharge RED ON EMMI ALERT Day # 1 Date: 08/30/2018 Red Alert Reason:  Know who to call about changes in condition? No  Scheduled follow-up? No    Outreach attempt:spoke with patient and wife.  Patient able to verify HIPAA. Addressed red alerts with them.  Patient states he understands to call his physician for any changes in his condition.  Patient noted to have appointment with PCP on tomorrow but they are waiting to hear from  PCP if it is a physical visit or telephone visit.  Advised that if they do not hear from PCP office soon to call to verify. They verbalized understanding and decline any further needs.     Plan: RN CM will close case.   Jone Baseman, RN, MSN [facility] Care Management Care Management Coordinator Direct Line [phone] Toll Free: [phone]  Fax: [phone]

## 2018-08-31 NOTE — Telephone Encounter (Signed)
Left message on vm for Ria Comment, of [facility], informing her Dr. Darnell Level is giving verbal orders for all services requested.

## 2018-08-31 NOTE — Telephone Encounter (Signed)
Agree with this. thanks. 

## 2018-08-31 NOTE — Telephone Encounter (Signed)
Attempted to reach patient to complete TCM. Left message with contact info on primary phone.

## 2018-09-01 ENCOUNTER — Telehealth: Payer: Self-pay | Admitting: *Deleted

## 2018-09-01 ENCOUNTER — Telehealth: Payer: Self-pay | Admitting: Adult Health

## 2018-09-01 DIAGNOSIS — I5032 Chronic diastolic (congestive) heart failure: Secondary | ICD-10-CM | POA: Diagnosis not present

## 2018-09-01 DIAGNOSIS — I130 Hypertensive heart and chronic kidney disease with heart failure and stage 1 through stage 4 chronic kidney disease, or unspecified chronic kidney disease: Secondary | ICD-10-CM | POA: Diagnosis not present

## 2018-09-01 DIAGNOSIS — I69354 Hemiplegia and hemiparesis following cerebral infarction affecting left non-dominant side: Secondary | ICD-10-CM | POA: Diagnosis not present

## 2018-09-01 DIAGNOSIS — N183 Chronic kidney disease, stage 3 (moderate): Secondary | ICD-10-CM | POA: Diagnosis not present

## 2018-09-01 DIAGNOSIS — Z951 Presence of aortocoronary bypass graft: Secondary | ICD-10-CM | POA: Diagnosis not present

## 2018-09-01 DIAGNOSIS — I69328 Other speech and language deficits following cerebral infarction: Secondary | ICD-10-CM | POA: Diagnosis not present

## 2018-09-01 NOTE — Telephone Encounter (Signed)
Agree with this order. Thanks.

## 2018-09-01 NOTE — Telephone Encounter (Signed)
Elder Cyphers PT with [facility] left a voicemail requesting verbal orders for PT 2 times a week for two weeks then 1 time a week for 6 weeks. Okay to leave verbal orders on voicemail because it is a secure line.

## 2018-09-01 NOTE — Telephone Encounter (Signed)
See below. Also, received request from [facility] outpatient clinic to draw CBC with plat and diff, CMP, Mg, Phos, LDH and uric acid for upcoming appointment, fax to [phone] attn Georga Kaufmann.  Can pt come for curbside labs or can home health draw these labs? I will order whichever way is needed.

## 2018-09-01 NOTE — Telephone Encounter (Signed)
Pt's wife consented to a Virtual Visit. Link was sent to [phone] [employer]  Pt understands that although there may be some limitations with this type of visit, we will take all precautions to reduce any security or privacy concerns.  Pt understands that this will be treated like an in office visit and we will file with pt's insurance, and there may be a patient responsible charge related to this service.

## 2018-09-01 NOTE — Telephone Encounter (Signed)
Spoke with Jeani Hawking, of [facility], informing her Dr. Darnell Level is giving verbal orders for PT requested.  Expresses her thanks.

## 2018-09-01 NOTE — Telephone Encounter (Signed)
Transitional Care Management Follow-up Telephone Call    Date discharged? 08/28/2018  How have you been since you were released from the hospital? [place].   Any patient concerns? None per spouse   Items Reviewed:  Medications reviewed: Yes  Allergies reviewed: Yes  Dietary changes reviewed: Yes  Referrals reviewed: Yes   Functional Questionnaire:  Independent - I Dependent - D    Activities of Daily Living (ADLs):    Personal hygiene - D Dressing - D Eating - I Maintaining continence - D incontinent of urine/bowels at bedtime Transferring - D - ambulates with walker, rollator   Independent Activities of Daily Living (iADLs): Basic communication skills - D Transportation - D Meal preparation  - D Shopping - D Housework - D Managing medications - D  Managing personal finances - D   Confirmed importance and date/time of follow-up visits scheduled YES  Provider Appointment booked with PCP 09/02/2018 @ 1000  Confirmed with patient if condition begins to worsen call PCP or go to the ER.  Patient was given the office number and encouraged to call back with question or concerns: YES

## 2018-09-02 ENCOUNTER — Other Ambulatory Visit: Payer: Self-pay

## 2018-09-02 ENCOUNTER — Telehealth: Payer: Self-pay | Admitting: Family Medicine

## 2018-09-02 ENCOUNTER — Encounter: Payer: Self-pay | Admitting: Family Medicine

## 2018-09-02 ENCOUNTER — Ambulatory Visit (INDEPENDENT_AMBULATORY_CARE_PROVIDER_SITE_OTHER): Payer: Medicare Other | Admitting: Family Medicine

## 2018-09-02 VITALS — BP 116/62 | HR 93 | Temp 97.7°F | Ht 69.5 in | Wt 154.5 lb

## 2018-09-02 DIAGNOSIS — I69354 Hemiplegia and hemiparesis following cerebral infarction affecting left non-dominant side: Secondary | ICD-10-CM | POA: Diagnosis not present

## 2018-09-02 DIAGNOSIS — I6521 Occlusion and stenosis of right carotid artery: Secondary | ICD-10-CM | POA: Diagnosis not present

## 2018-09-02 DIAGNOSIS — Z8673 Personal history of transient ischemic attack (TIA), and cerebral infarction without residual deficits: Secondary | ICD-10-CM | POA: Diagnosis not present

## 2018-09-02 DIAGNOSIS — N183 Chronic kidney disease, stage 3 unspecified: Secondary | ICD-10-CM

## 2018-09-02 DIAGNOSIS — I130 Hypertensive heart and chronic kidney disease with heart failure and stage 1 through stage 4 chronic kidney disease, or unspecified chronic kidney disease: Secondary | ICD-10-CM | POA: Diagnosis not present

## 2018-09-02 DIAGNOSIS — I10 Essential (primary) hypertension: Secondary | ICD-10-CM

## 2018-09-02 DIAGNOSIS — R4781 Slurred speech: Secondary | ICD-10-CM | POA: Diagnosis not present

## 2018-09-02 DIAGNOSIS — E782 Mixed hyperlipidemia: Secondary | ICD-10-CM

## 2018-09-02 DIAGNOSIS — I639 Cerebral infarction, unspecified: Secondary | ICD-10-CM

## 2018-09-02 DIAGNOSIS — C9110 Chronic lymphocytic leukemia of B-cell type not having achieved remission: Secondary | ICD-10-CM | POA: Diagnosis not present

## 2018-09-02 DIAGNOSIS — I69328 Other speech and language deficits following cerebral infarction: Secondary | ICD-10-CM | POA: Diagnosis not present

## 2018-09-02 DIAGNOSIS — G3184 Mild cognitive impairment, so stated: Secondary | ICD-10-CM | POA: Diagnosis not present

## 2018-09-02 DIAGNOSIS — I5032 Chronic diastolic (congestive) heart failure: Secondary | ICD-10-CM | POA: Diagnosis not present

## 2018-09-02 DIAGNOSIS — Z951 Presence of aortocoronary bypass graft: Secondary | ICD-10-CM | POA: Diagnosis not present

## 2018-09-02 LAB — CBC WITH DIFFERENTIAL/PLATELET
Basophils Absolute: 0 10*3/uL (ref 0.0–0.1)
Basophils Relative: 0.4 % (ref 0.0–3.0)
Eosinophils Absolute: 0.3 10*3/uL (ref 0.0–0.7)
Eosinophils Relative: 4.4 % (ref 0.0–5.0)
HCT: 44.5 % (ref 39.0–52.0)
Hemoglobin: 15.4 g/dL (ref 13.0–17.0)
Lymphocytes Relative: 15.9 % (ref 12.0–46.0)
Lymphs Abs: 1.2 10*3/uL (ref 0.7–4.0)
MCHC: 34.5 g/dL (ref 30.0–36.0)
MCV: 93.3 fl (ref 78.0–100.0)
Monocytes Absolute: 0.7 10*3/uL (ref 0.1–1.0)
Monocytes Relative: 9.4 % (ref 3.0–12.0)
Neutro Abs: 5.1 10*3/uL (ref 1.4–7.7)
Neutrophils Relative %: 69.9 % (ref 43.0–77.0)
Platelets: 156 10*3/uL (ref 150.0–400.0)
RBC: 4.77 Mil/uL (ref 4.22–5.81)
RDW: 15.1 % (ref 11.5–15.5)
WBC: 7.4 10*3/uL (ref 4.0–10.5)

## 2018-09-02 LAB — COMPREHENSIVE METABOLIC PANEL
ALT: 30 U/L (ref 0–53)
AST: 13 U/L (ref 0–37)
Albumin: 4.3 g/dL (ref 3.5–5.2)
Alkaline Phosphatase: 113 U/L (ref 39–117)
BUN: 23 mg/dL (ref 6–23)
CO2: 31 mEq/L (ref 19–32)
Calcium: 10.1 mg/dL (ref 8.4–10.5)
Chloride: 104 mEq/L (ref 96–112)
Creatinine, Ser: 1.2 mg/dL (ref 0.40–1.50)
GFR: 57.21 mL/min — ABNORMAL LOW (ref >60.00)
Glucose, Bld: 82 mg/dL (ref 70–99)
Potassium: 4.3 mEq/L (ref 3.5–5.1)
Sodium: 143 mEq/L (ref 135–145)
Total Bilirubin: 0.7 mg/dL (ref 0.2–1.2)
Total Protein: 6.8 g/dL (ref 6.0–8.3)

## 2018-09-02 LAB — URIC ACID: Uric Acid, Serum: 4.3 mg/dL (ref 4.0–7.8)

## 2018-09-02 LAB — PHOSPHORUS: Phosphorus: 2.6 mg/dL (ref 2.3–4.6)

## 2018-09-02 LAB — MAGNESIUM: Magnesium: 2.3 mg/dL (ref 1.5–2.5)

## 2018-09-02 MED ORDER — NITROGLYCERIN 0.4 MG SL SUBL: 0.4000 mg | SUBLINGUAL_TABLET | SUBLINGUAL | 3 refills | Status: AC | PRN

## 2018-09-02 NOTE — Progress Notes (Signed)
This visit was conducted in person.  BP 116/62 (BP Location: Right Arm, Patient Position: Sitting, Cuff Size: Normal)    Pulse 93    Temp 97.7 F (36.5 C) (Oral)    Ht 5' 9.5" (1.765 m)    Wt 154 lb 8 oz (70.1 kg)    SpO2 97%    BMI 22.49 kg/m    CC: TCM hosp f/u visit Subjective:    Patient ID: MATTY VANROEKEL, male    DOB: Jun 09, 1928, 85 y.o.   MRN: [id number]  HPI: KENDERICK KOBLER is a 85 y.o. male presenting on 09/02/2018 for Hospitalization Follow-up (Pt accomapanied by wife, [name]. )   Recent hospitalization for acute recurrent L frontal stroke, admitted with slurred speech. Neurology consulted with patient. Aspirin increased to full dose. Heart ultrasound and carotid ultrasound (50-69% RICA narrowing) completed. Recommended outpatient neurology evaluation. Prior stroke 04/2018 with residual L sided hemiparesis and dysarthria.   Persistent trouble with speech and with swallow - diet change was not recommended in hospital. Notes ongoing L hand weakness/numbness.   rec permissive hypertension while in the house.  Discharged with home health physical therapy, occupational therapy, nurse, nurse aide and speech therapist.   DATE OF ADMISSION:  08/27/2018   DATE OF DISCHARGE: No discharge date for patient encounter. TCM hospital f/u phone call performed 09/01/2018.   Discharge Dx: Slurred speech See D/C summary for other diagnoses.      Relevant past medical, surgical, family and social history reviewed and updated as indicated. Interim medical history since our last visit reviewed. Allergies and medications reviewed and updated. Outpatient Medications Prior to Visit  Medication Sig Dispense Refill   acetaminophen (TYLENOL) 325 MG tablet Take 2 tablets (650 mg total) by mouth every 4 (four) hours as needed for mild pain (or temp > 37.5 C (99.5 F)).     allopurinol (ZYLOPRIM) 300 MG tablet Take 1 tablet (300 mg total) by mouth daily. 90 tablet 1   aspirin EC 325 MG EC tablet Take  1 tablet (325 mg total) by mouth daily. 180 tablet 0   cholecalciferol (VITAMIN D) 1000 units tablet Take 2,000 Units by mouth daily.     lisinopril (PRINIVIL,ZESTRIL) 5 MG tablet Take 1 tablet (5 mg total) by mouth daily.     polyethylene glycol powder (GLYCOLAX/MIRALAX) powder Take 17 g by mouth daily.     simvastatin (ZOCOR) 10 MG tablet TAKE 1 TABLET AT BEDTIME (Patient taking differently: Take 10 mg by mouth daily. ) 90 tablet 3   valACYclovir (VALTREX) 500 MG tablet Take 1 tablet (500 mg total) by mouth daily. 90 tablet 1   vitamin B-12 (CYANOCOBALAMIN) 1000 MCG tablet Take 1 tablet (1,000 mcg total) by mouth daily.     nitroGLYCERIN (NITROSTAT) 0.4 MG SL tablet Place 1 tablet (0.4 mg total) under the tongue every 5 (five) minutes as needed for chest pain (max 3 doses in 15 minutes). 25 tablet 3   No facility-administered medications prior to visit.      Per HPI unless specifically indicated in ROS section below Review of Systems Objective:    BP 116/62 (BP Location: Right Arm, Patient Position: Sitting, Cuff Size: Normal)    Pulse 93    Temp 97.7 F (36.5 C) (Oral)    Ht 5' 9.5" (1.765 m)    Wt 154 lb 8 oz (70.1 kg)    SpO2 97%    BMI 22.49 kg/m   Wt Readings from Last 3 Encounters:  09/02/18 154  lb 8 oz (70.1 kg)  08/27/18 150 lb (68 kg)  08/13/18 150 lb (68 kg)    Physical Exam Vitals signs and nursing note reviewed.  Constitutional:      Appearance: Normal appearance. He is not ill-appearing.     Comments: thin  HENT:     Head: Normocephalic and atraumatic.     Mouth/Throat:     Mouth: Mucous membranes are moist.     Pharynx: No posterior oropharyngeal erythema.  Neck:     Musculoskeletal: Normal range of motion.     Vascular: Carotid bruit (R sided) present.  Cardiovascular:     Rate and Rhythm: Normal rate and regular rhythm.     Pulses: Normal pulses.     Heart sounds: Normal heart sounds. No murmur.  Pulmonary:     Effort: Pulmonary effort is normal. No  respiratory distress.     Breath sounds: Normal breath sounds. No wheezing, rhonchi or rales.  Musculoskeletal:     Right lower leg: No edema.     Left lower leg: No edema.  Skin:    General: Skin is warm and dry.     Findings: No erythema or rash.  Neurological:     Mental Status: He is alert.     Comments: CN 2-12 intact, EOMI. Dysarthria. Noted mild L sided weakness (grip strength, shoulder elevation, hip flexion)  Psychiatric:        Mood and Affect: Mood normal.        Behavior: Behavior normal.      Results for orders placed or performed in visit on 09/02/18  CBC with Differential/Platelet  Result Value Ref Range   WBC 7.4 4.0 - 10.5 K/uL   RBC 4.77 4.22 - 5.81 Mil/uL   Hemoglobin 15.4 13.0 - 17.0 g/dL   HCT 44.5 39.0 - 52.0 %   MCV 93.3 78.0 - 100.0 fl   MCHC 34.5 30.0 - 36.0 g/dL   RDW 15.1 11.5 - 15.5 %   Platelets 156.0 150.0 - 400.0 K/uL   Neutrophils Relative % 69.9 43.0 - 77.0 %   Lymphocytes Relative 15.9 12.0 - 46.0 %   Monocytes Relative 9.4 3.0 - 12.0 %   Eosinophils Relative 4.4 0.0 - 5.0 %   Basophils Relative 0.4 0.0 - 3.0 %   Neutro Abs 5.1 1.4 - 7.7 K/uL   Lymphs Abs 1.2 0.7 - 4.0 K/uL   Monocytes Absolute 0.7 0.1 - 1.0 K/uL   Eosinophils Absolute 0.3 0.0 - 0.7 K/uL   Basophils Absolute 0.0 0.0 - 0.1 K/uL  Comprehensive metabolic panel  Result Value Ref Range   Sodium 143 135 - 145 mEq/L   Potassium 4.3 3.5 - 5.1 mEq/L   Chloride 104 96 - 112 mEq/L   CO2 31 19 - 32 mEq/L   Glucose, Bld 82 70 - 99 mg/dL   BUN 23 6 - 23 mg/dL   Creatinine, Ser 1.20 0.40 - 1.50 mg/dL   Total Bilirubin 0.7 0.2 - 1.2 mg/dL   Alkaline Phosphatase 113 39 - 117 U/L   AST 13 0 - 37 U/L   ALT 30 0 - 53 U/L   Total Protein 6.8 6.0 - 8.3 g/dL   Albumin 4.3 3.5 - 5.2 g/dL   Calcium 10.1 8.4 - 10.5 mg/dL   GFR 57.21 (L) >60.00 mL/min  Uric acid  Result Value Ref Range   Uric Acid, Serum 4.3 4.0 - 7.8 mg/dL  Lactate dehydrogenase  Result Value Ref Range   LDH  163 120  - 250 U/L  Magnesium  Result Value Ref Range   Magnesium 2.3 1.5 - 2.5 mg/dL  Phosphorus  Result Value Ref Range   Phosphorus 2.6 2.3 - 4.6 mg/dL    Lab Results  Component Value Date   CHOL 118 08/28/2018   HDL 35 (L) 08/28/2018   LDLCALC 56 08/28/2018   TRIG 135 08/28/2018   CHOLHDL 3.4 08/28/2018     ECHOCARDIOGRAM IMPRESSIONS5/29/2020  1. The left ventricle has normal systolic function with an ejection fraction of 60-65%. The cavity size was normal. Left ventricular diastolic Doppler parameters are consistent with impaired relaxation.  2. The right ventricle has normal systolic function. The cavity was normal. There is mildly increased right ventricular wall thickness. Right ventricular systolic pressure could not be assessed.  3. Left atrial size was not well visualized.  4. The mitral valve is degenerative. Mild thickening of the mitral valve leaflet.  5. The aortic valve has an indeterminate number of cusps. Aortic valve regurgitation is mild by color flow Doppler. No stenosis of the aortic valve.  6. The aortic root is normal in size and structure.  MRI HEAD IMPRESSION: 1. 8 mm acute ischemic nonhemorrhagic infarct involving the subcortical left frontal white matter. 2. Multifocal chronic infarcts involving the bilateral basal ganglia, left thalamus, and pons. 3. Underlying age-related cerebral atrophy with advanced chronic microvascular ischemic disease. MRA HEAD IMPRESSION: Negative intracranial MRA with no large vessel occlusion or hemodynamically significant or correctable stenosis. Electronically Signed   By: Jeannine Boga M.D.   On: 08/27/2018 22:27  CAROTID US IMPRESSION: Moderate right ICA stenosis estimated at 50-69% by ultrasound criteria. Minor left ICA narrowing, less than 50% Patent antegrade vertebral flow bilaterally Electronically Signed   By: Jerilynn Mages.  Shick M.D.   On: 08/28/2018 12:12 Assessment & Plan:   Problem List Items Addressed This  Visit      Chronic   Chronic lymphocytic leukemia, Rai stage 0 ([place])    Labs ordered per [facility] onc request - will fax to them.       Relevant Orders   CBC with Differential/Platelet (Completed)   Comprehensive metabolic panel (Completed)   Uric acid (Completed)   Lactate dehydrogenase (Completed)   Magnesium (Completed)   Phosphorus (Completed)     Other   Slurred speech    Pending home ST.      Mixed hyperlipidemia    Continues simvastatin 10mg  daily. LDL remains <70      Relevant Medications   nitroGLYCERIN (NITROSTAT) 0.4 MG SL tablet   MCI (mild cognitive impairment) with memory loss   Essential hypertension    BP stable today.       Relevant Medications   nitroGLYCERIN (NITROSTAT) 0.4 MG SL tablet   CVA (cerebrovascular accident) ([place]) - Primary    Recent repeat stroke despite aspirin 81mg  - this has been increased to 325mg  daily. Prior on aggrenox but that was discontinued after he suffered hemorrhagic stroke 04/2018. Difficult situation - will work towards ongoing sugar, blood pressure, lipid control. Upcoming appt with neurology later this month. HH involved.       Relevant Medications   nitroGLYCERIN (NITROSTAT) 0.4 MG SL tablet   Chronic kidney disease, stage 3 (H[facility])    Stable. Update Cr.       Carotid artery stenosis   Relevant Medications   nitroGLYCERIN (NITROSTAT) 0.4 MG SL tablet       Meds ordered this encounter  Medications   nitroGLYCERIN (NITROSTAT) 0.4 MG SL tablet  Sig: Place 1 tablet (0.4 mg total) under the tongue every 5 (five) minutes as needed for chest pain (max 3 doses in 15 minutes).    Dispense:  25 tablet    Refill:  3   Orders Placed This Encounter  Procedures   CBC with Differential/Platelet   Comprehensive metabolic panel   Uric acid   Lactate dehydrogenase   Magnesium   Phosphorus    Follow up plan: Return keep follow up appointment.  Ria Bush, MD

## 2018-09-02 NOTE — Telephone Encounter (Signed)
Labs drawn at today's OV.

## 2018-09-02 NOTE — Telephone Encounter (Signed)
Copied from [place] [id number]. Topic: Quick Communication - Home Health Verbal Orders >> Sep 02, 2018  4:16 PM Erick Blinks wrote: Caller/Agency: [place] Number: [phone]  Requesting OT/PT/Skilled Nursing/Social Work/Speech Therapy: Requesting OT  Frequency:  2 week 1 09/28/2018 2 week 2  1 week 2

## 2018-09-02 NOTE — Patient Instructions (Signed)
New aspirin dose is 325mg  daily. Continue other medicines. Keep appointment with neurology later this month.  Will be in touch with home health.

## 2018-09-03 ENCOUNTER — Encounter: Payer: Self-pay | Admitting: Family Medicine

## 2018-09-03 DIAGNOSIS — I130 Hypertensive heart and chronic kidney disease with heart failure and stage 1 through stage 4 chronic kidney disease, or unspecified chronic kidney disease: Secondary | ICD-10-CM | POA: Diagnosis not present

## 2018-09-03 DIAGNOSIS — I69328 Other speech and language deficits following cerebral infarction: Secondary | ICD-10-CM | POA: Diagnosis not present

## 2018-09-03 DIAGNOSIS — I69354 Hemiplegia and hemiparesis following cerebral infarction affecting left non-dominant side: Secondary | ICD-10-CM | POA: Diagnosis not present

## 2018-09-03 DIAGNOSIS — N183 Chronic kidney disease, stage 3 (moderate): Secondary | ICD-10-CM | POA: Diagnosis not present

## 2018-09-03 DIAGNOSIS — I5032 Chronic diastolic (congestive) heart failure: Secondary | ICD-10-CM | POA: Diagnosis not present

## 2018-09-03 DIAGNOSIS — Z951 Presence of aortocoronary bypass graft: Secondary | ICD-10-CM | POA: Diagnosis not present

## 2018-09-03 LAB — LACTATE DEHYDROGENASE: LDH: 163 U/L (ref 120–250)

## 2018-09-03 NOTE — Assessment & Plan Note (Signed)
No stenosis on latest [place].

## 2018-09-03 NOTE — Telephone Encounter (Signed)
Spoke with Izora Gala informing her Dr. Darnell Level is giving verbal orders for services requested.

## 2018-09-03 NOTE — Assessment & Plan Note (Addendum)
Continues simvastatin 10mg  daily. LDL remains <70

## 2018-09-03 NOTE — Assessment & Plan Note (Addendum)
Labs ordered per [facility] onc request - will fax to them.

## 2018-09-03 NOTE — Assessment & Plan Note (Signed)
Stable. Update Cr.

## 2018-09-03 NOTE — Assessment & Plan Note (Signed)
BP stable today.

## 2018-09-03 NOTE — Assessment & Plan Note (Addendum)
Recent repeat stroke despite aspirin 81mg  - this has been increased to 325mg  daily. Prior on aggrenox but that was discontinued after he suffered hemorrhagic stroke 04/2018. Difficult situation - will work towards ongoing sugar, blood pressure, lipid control. Upcoming appt with neurology later this month. HH involved.

## 2018-09-03 NOTE — Telephone Encounter (Signed)
Jimmy [name] OT left v/m requesting cb this AM with status of OT HH orders; Jimmy [name] was hoping to go to pts home today at [place] request cb with Verbal orders.Please advise.

## 2018-09-03 NOTE — Telephone Encounter (Signed)
#  [phone] She called requesting orders for this morning to keep from missing a visit this week. Pt and nursing are coming this afternoon, per pt.

## 2018-09-03 NOTE — Assessment & Plan Note (Signed)
Pending home ST.

## 2018-09-03 NOTE — Telephone Encounter (Signed)
I spoke with Jimmy [name] and notified as instructed. Jimmy [name] appreciated the quick cb and understood that it was after hours when request was received on 09/02/18. Nothing further needed.

## 2018-09-03 NOTE — Telephone Encounter (Signed)
Agree with this. Thanks  I just received phone note today

## 2018-09-04 DIAGNOSIS — Z951 Presence of aortocoronary bypass graft: Secondary | ICD-10-CM | POA: Diagnosis not present

## 2018-09-04 DIAGNOSIS — I69354 Hemiplegia and hemiparesis following cerebral infarction affecting left non-dominant side: Secondary | ICD-10-CM | POA: Diagnosis not present

## 2018-09-04 DIAGNOSIS — I5032 Chronic diastolic (congestive) heart failure: Secondary | ICD-10-CM | POA: Diagnosis not present

## 2018-09-04 DIAGNOSIS — N183 Chronic kidney disease, stage 3 (moderate): Secondary | ICD-10-CM | POA: Diagnosis not present

## 2018-09-04 DIAGNOSIS — I130 Hypertensive heart and chronic kidney disease with heart failure and stage 1 through stage 4 chronic kidney disease, or unspecified chronic kidney disease: Secondary | ICD-10-CM | POA: Diagnosis not present

## 2018-09-04 DIAGNOSIS — I69328 Other speech and language deficits following cerebral infarction: Secondary | ICD-10-CM | POA: Diagnosis not present

## 2018-09-07 DIAGNOSIS — I69328 Other speech and language deficits following cerebral infarction: Secondary | ICD-10-CM | POA: Diagnosis not present

## 2018-09-07 DIAGNOSIS — Z8619 Personal history of other infectious and parasitic diseases: Secondary | ICD-10-CM | POA: Diagnosis not present

## 2018-09-07 DIAGNOSIS — Z7902 Long term (current) use of antithrombotics/antiplatelets: Secondary | ICD-10-CM | POA: Diagnosis not present

## 2018-09-07 DIAGNOSIS — I129 Hypertensive chronic kidney disease with stage 1 through stage 4 chronic kidney disease, or unspecified chronic kidney disease: Secondary | ICD-10-CM | POA: Diagnosis not present

## 2018-09-07 DIAGNOSIS — E7800 Pure hypercholesterolemia, unspecified: Secondary | ICD-10-CM | POA: Diagnosis not present

## 2018-09-07 DIAGNOSIS — Z85828 Personal history of other malignant neoplasm of skin: Secondary | ICD-10-CM | POA: Diagnosis not present

## 2018-09-07 DIAGNOSIS — I69354 Hemiplegia and hemiparesis following cerebral infarction affecting left non-dominant side: Secondary | ICD-10-CM | POA: Diagnosis not present

## 2018-09-07 DIAGNOSIS — Z8673 Personal history of transient ischemic attack (TIA), and cerebral infarction without residual deficits: Secondary | ICD-10-CM | POA: Diagnosis not present

## 2018-09-07 DIAGNOSIS — I2510 Atherosclerotic heart disease of native coronary artery without angina pectoris: Secondary | ICD-10-CM | POA: Diagnosis not present

## 2018-09-07 DIAGNOSIS — I5032 Chronic diastolic (congestive) heart failure: Secondary | ICD-10-CM | POA: Diagnosis not present

## 2018-09-07 DIAGNOSIS — Z951 Presence of aortocoronary bypass graft: Secondary | ICD-10-CM | POA: Diagnosis not present

## 2018-09-07 DIAGNOSIS — Z006 Encounter for examination for normal comparison and control in clinical research program: Secondary | ICD-10-CM | POA: Diagnosis not present

## 2018-09-07 DIAGNOSIS — N183 Chronic kidney disease, stage 3 (moderate): Secondary | ICD-10-CM | POA: Diagnosis not present

## 2018-09-07 DIAGNOSIS — N189 Chronic kidney disease, unspecified: Secondary | ICD-10-CM | POA: Diagnosis not present

## 2018-09-07 DIAGNOSIS — Z9221 Personal history of antineoplastic chemotherapy: Secondary | ICD-10-CM | POA: Diagnosis not present

## 2018-09-07 DIAGNOSIS — K118 Other diseases of salivary glands: Secondary | ICD-10-CM | POA: Diagnosis not present

## 2018-09-07 DIAGNOSIS — C9110 Chronic lymphocytic leukemia of B-cell type not having achieved remission: Secondary | ICD-10-CM | POA: Diagnosis not present

## 2018-09-07 DIAGNOSIS — I130 Hypertensive heart and chronic kidney disease with heart failure and stage 1 through stage 4 chronic kidney disease, or unspecified chronic kidney disease: Secondary | ICD-10-CM | POA: Diagnosis not present

## 2018-09-07 DIAGNOSIS — E790 Hyperuricemia without signs of inflammatory arthritis and tophaceous disease: Secondary | ICD-10-CM | POA: Diagnosis not present

## 2018-09-08 DIAGNOSIS — I5032 Chronic diastolic (congestive) heart failure: Secondary | ICD-10-CM | POA: Diagnosis not present

## 2018-09-08 DIAGNOSIS — I130 Hypertensive heart and chronic kidney disease with heart failure and stage 1 through stage 4 chronic kidney disease, or unspecified chronic kidney disease: Secondary | ICD-10-CM | POA: Diagnosis not present

## 2018-09-08 DIAGNOSIS — N183 Chronic kidney disease, stage 3 (moderate): Secondary | ICD-10-CM | POA: Diagnosis not present

## 2018-09-08 DIAGNOSIS — I69354 Hemiplegia and hemiparesis following cerebral infarction affecting left non-dominant side: Secondary | ICD-10-CM | POA: Diagnosis not present

## 2018-09-08 DIAGNOSIS — I69328 Other speech and language deficits following cerebral infarction: Secondary | ICD-10-CM | POA: Diagnosis not present

## 2018-09-08 DIAGNOSIS — Z951 Presence of aortocoronary bypass graft: Secondary | ICD-10-CM | POA: Diagnosis not present

## 2018-09-09 ENCOUNTER — Emergency Department
Admission: EM | Admit: 2018-09-09 | Discharge: 2018-09-09 | Disposition: A | Discharge status: Home / Self Care | Payer: Medicare Other | Attending: Emergency Medicine | Admitting: Emergency Medicine

## 2018-09-09 ENCOUNTER — Other Ambulatory Visit: Payer: Self-pay

## 2018-09-09 ENCOUNTER — Emergency Department: Payer: Medicare Other

## 2018-09-09 ENCOUNTER — Encounter: Payer: Self-pay | Admitting: Emergency Medicine

## 2018-09-09 DIAGNOSIS — R531 Weakness: Secondary | ICD-10-CM | POA: Diagnosis not present

## 2018-09-09 DIAGNOSIS — Z8673 Personal history of transient ischemic attack (TIA), and cerebral infarction without residual deficits: Secondary | ICD-10-CM | POA: Diagnosis not present

## 2018-09-09 DIAGNOSIS — R630 Anorexia: Secondary | ICD-10-CM | POA: Insufficient documentation

## 2018-09-09 DIAGNOSIS — Z87891 Personal history of nicotine dependence: Secondary | ICD-10-CM | POA: Diagnosis not present

## 2018-09-09 DIAGNOSIS — Z03818 Encounter for observation for suspected exposure to other biological agents ruled out: Secondary | ICD-10-CM | POA: Diagnosis not present

## 2018-09-09 DIAGNOSIS — I130 Hypertensive heart and chronic kidney disease with heart failure and stage 1 through stage 4 chronic kidney disease, or unspecified chronic kidney disease: Secondary | ICD-10-CM | POA: Diagnosis not present

## 2018-09-09 DIAGNOSIS — R4182 Altered mental status, unspecified: Secondary | ICD-10-CM | POA: Diagnosis not present

## 2018-09-09 DIAGNOSIS — I5030 Unspecified diastolic (congestive) heart failure: Secondary | ICD-10-CM | POA: Insufficient documentation

## 2018-09-09 DIAGNOSIS — I2510 Atherosclerotic heart disease of native coronary artery without angina pectoris: Secondary | ICD-10-CM | POA: Diagnosis not present

## 2018-09-09 DIAGNOSIS — N183 Chronic kidney disease, stage 3 (moderate): Secondary | ICD-10-CM | POA: Diagnosis not present

## 2018-09-09 DIAGNOSIS — Z951 Presence of aortocoronary bypass graft: Secondary | ICD-10-CM | POA: Diagnosis not present

## 2018-09-09 LAB — CBC
HCT: 47.1 % (ref 39.0–52.0)
Hemoglobin: 15.7 g/dL (ref 13.0–17.0)
MCH: 31.2 pg (ref 26.0–34.0)
MCHC: 33.3 g/dL (ref 30.0–36.0)
MCV: 93.6 fL (ref 80.0–100.0)
Platelets: 176 10*3/uL (ref 150–400)
RBC: 5.03 MIL/uL (ref 4.22–5.81)
RDW: 13.8 % (ref 11.5–15.5)
WBC: 8.7 10*3/uL (ref 4.0–10.5)
nRBC: 0 % (ref 0.0–0.2)

## 2018-09-09 LAB — BASIC METABOLIC PANEL
Anion gap: 9 (ref 5–15)
BUN: 24 mg/dL — ABNORMAL HIGH (ref 8–23)
CO2: 26 mmol/L (ref 22–32)
Calcium: 10.1 mg/dL (ref 8.9–10.3)
Chloride: 104 mmol/L (ref 98–111)
Creatinine, Ser: 1.33 mg/dL — ABNORMAL HIGH (ref 0.61–1.24)
GFR calc Af Amer: 55 mL/min — ABNORMAL LOW (ref >60)
GFR calc non Af Amer: 48 mL/min — ABNORMAL LOW (ref >60)
Glucose, Bld: 158 mg/dL — ABNORMAL HIGH (ref 70–99)
Potassium: 4 mmol/L (ref 3.5–5.1)
Sodium: 139 mmol/L (ref 135–145)

## 2018-09-09 LAB — GLUCOSE, CAPILLARY: Glucose-Capillary: 146 mg/dL — ABNORMAL HIGH (ref 70–99)

## 2018-09-09 MED ORDER — SODIUM CHLORIDE 0.9% FLUSH: 3.0000 mL | Freq: Once | INTRAVENOUS | Status: DC

## 2018-09-09 MED ORDER — DRONABINOL 2.5 MG PO CAPS: 2.5000 mg | ORAL_CAPSULE | Freq: Every day | ORAL | 1 refills | Status: DC

## 2018-09-09 NOTE — ED Triage Notes (Signed)
Pt states he does not know why he here that his wofe called because he was not "reacting to her". Pt slow to answer. Pt denies pain

## 2018-09-09 NOTE — ED Notes (Signed)
Assisted pt to try to urinate in the urinal- pt was unsuccessful and states he does not feel the urge to go right now

## 2018-09-09 NOTE — ED Provider Notes (Signed)
[facility] Emergency Department Provider Note       Time seen: ----------------------------------------- 6:38 PM on 09/09/2018 -----------------------------------------   I have reviewed the triage vital signs and the nursing notes.  HISTORY   Chief Complaint Altered Mental Status and Weakness    HPI Jimmy [name] is a 85 y.o. male with a history of coronary artery disease, chronic kidney disease, CLL, GERD, hyperlipidemia, hypertension who presents to the ED for altered mental status.  Patient states he does not know why he is here and his wife called EMS because he was not acting properly.  She thinks he may have had a stroke but he denies any complaints.  He has had some numbness in his left arm which is old.  He has some slurred speech that the wife states is normal.  He denies any recent illness or other complaints.  Past Medical History:  Diagnosis Date  . CAD (coronary artery disease)    a. 04/2001 S/P CABGx 4;  b. 2008 MV:  EF 63% normal perfusion.  . Carotid stenosis    a. 11/2012 Carotid U/S:  RICA 99-83%, LICA 38-25%.  . Chronic kidney disease, stage 3 ([place])   . CLL (chronic lymphoblastic leukemia) 2009   Stage IV; Dr. Ivor Messier - referred to Dr. Lissa Merlin [facility] 07/2013 now on Rituxan (10/2013) --> stage 0 (09/2014)  . Diastolic CHF ([place])    a. 0/5397 Echo: EF 55-65%, mild conc LVH, Gr 1 DD, mild AS, triv AI, mildly dil Ao root (3.5 cm).  . GERD (gastroesophageal reflux disease) 1990s  . History of CVA (cerebrovascular accident) 2015   by MRI - remote L internal capsule  . History of herpes genitalis    valtrex daily  . Hyperlipemia 2002  . Hypertension 2002  . Mass of submandibular region 2015   referred to gen surg after chemo  . Mild aortic stenosis    a. 07/2011 Echo: Mild AS, triv AI.  [name] Stroke ([place])   . Thrombocytopenia ([place])    outpatient goals Hgb >9, plt >20  . TIA (transient ischemic attack)    04/2016    Patient Active Problem  List   Diagnosis Date Noted  . CVA (cerebrovascular accident) ([place]) 09/03/2018  . Slurred speech 08/27/2018  . Hypoalbuminemia due to protein-calorie malnutrition ([place])   . Hypertension with goal blood pressure less than 120/80   . Dysphagia, post-stroke   . ICH (intracerebral hemorrhage) ([place]) 04/04/2018  . MCI (mild cognitive impairment) with memory loss 06/17/2017  . Dizziness 06/17/2017  . Diarrhea 10/18/2016  . TIA (transient ischemic attack) 04/08/2016  . Thrombocytopenia ([place]) 04/08/2016  . Leg weakness 11/11/2014  . Advanced care planning/counseling discussion 09/16/2014  . Nocturia 09/16/2014  . Mass of submandibular region   . History of CVA (cerebrovascular accident)   . Chest pressure 11/19/2013  . S/P CABG (coronary artery bypass graft) 11/19/2013  . Chronic kidney disease, stage 3 ([place])   . Aortic stenosis   . Medicare annual wellness visit, subsequent 08/27/2012  . Vitamin D deficiency 08/17/2012  . Dyspnea 06/26/2011  . Erectile dysfunction 09/18/2010  . Carotid artery stenosis 12/01/2009  . UNSPECIFIED SUBJECTIVE VISUAL DISTURBANCE 05/01/2009  . BACK PAIN, LUMBAR 10/24/2008  . Chronic lymphocytic leukemia, Rai stage 0 ([place]) 04/02/2007    Class: Chronic  . DERMATITIS, SEBORRHEIC NOS 12/16/2006  . GENITAL HERPES 12/04/2006  . Mixed hyperlipidemia 12/04/2006  . Essential hypertension 12/04/2006  . Coronary atherosclerosis 12/04/2006  . GERD 12/04/2006  .  Prediabetes 12/04/2006  . RENAL CALCULUS, HX OF 12/04/2006    Past Surgical History:  Procedure Laterality Date  . ANGIOPLASTY  1998  . CATARACT EXTRACTION, BILATERAL     R 1/09, L 8/09  . COLONOSCOPY  11/29/1987   Normal  . COLONOSCOPY  02/07/2003   Hemm. Internal, focal proctitis, negative biopsy  . COLONOSCOPY  12/12/2004   Internal external hemorrhoids, +proctits, negative biopsy  . CORONARY ANGIOPLASTY  1998  . CORONARY ARTERY BYPASS GRAFT  04/23/2001   x4, Dr. Pia Mau  .  ESOPHAGOGASTRODUODENOSCOPY  11/29/1987   Gastritis  . ETT  12/10/2006   Persantine myoview nml  . HEMORROIDECTOMY  1954   Fissure repair, [place]  . HEPATIC ARTERY ANGIOPLASTY  1954   [place]  . KIDNEY STONE SURGERY  1977   Dr Redmond Baseman  . LITHOTRIPSY  1990s   Multiple  . US ECHOCARDIOGRAPHY  07/2011   nl sys fxn, EF 55-60%, grade 1 diast dysfunction, mild AS, mildly elevated PA pressure    Allergies New skin and Tape  Social History Social History   Tobacco Use  . Smoking status: Former Smoker    Packs/day: 2.00    Years: 30.00    Pack years: 60.00    Last attempt to quit: 02/28/1979    Years since quitting: 39.5  . Smokeless tobacco: Never Used  . Tobacco comment: quit 1980  Substance Use Topics  . Alcohol use: No    Alcohol/week: 0.0 standard drinks    Comment: occasional wine  . Drug use: Never   Review of Systems Constitutional: Negative for fever. Cardiovascular: Negative for chest pain. Respiratory: Negative for shortness of breath. Gastrointestinal: Negative for abdominal pain, vomiting and diarrhea. Musculoskeletal: Negative for back pain. Skin: Negative for rash. Neurological: Negative for headaches, focal weakness or acute numbness.  All systems negative/normal/unremarkable except as stated in the HPI  ____________________________________________   PHYSICAL EXAM:  VITAL SIGNS: ED Triage Vitals  Enc Vitals Group     BP 09/09/18 1417 (!) 97/52     Pulse Rate 09/09/18 1417 98     Resp 09/09/18 1417 16     Temp 09/09/18 1417 98 F (36.7 C)     Temp Source 09/09/18 1417 Oral     SpO2 09/09/18 1417 95 %     Weight 09/09/18 1405 150 lb (68 kg)     Height 09/09/18 1405 5\' 6"  (1.676 m)     Head Circumference --      Peak Flow --      Pain Score 09/09/18 1405 0     Pain Loc --      Pain Edu? --      Excl. in [place]? --    Constitutional: Alert and oriented.  No acute distress Eyes: Conjunctivae are normal. Normal extraocular movements. ENT       Head: Normocephalic and atraumatic.      Nose: No congestion/rhinnorhea.      Mouth/Throat: Mucous membranes are moist.      Neck: No stridor. Cardiovascular: Normal rate, regular rhythm. No murmurs, rubs, or gallops. Respiratory: Normal respiratory effort without tachypnea nor retractions. Breath sounds are clear and equal bilaterally. No wheezes/rales/rhonchi. Gastrointestinal: Soft and nontender. Normal bowel sounds Musculoskeletal: Nontender with normal range of motion in extremities. No lower extremity tenderness nor edema. Neurologic:  Normal speech and language.  Some numbness is noted to the left hand which he states is chronic Skin:  Skin is warm, dry and intact. No rash noted. Psychiatric:  Mood and affect are normal.  Slightly slurred speech, tearful ____________________________________________  EKG: Interpreted by me.  Sinus rhythm with rate of 96 bpm, normal PR interval, normal QRS, normal QT  ____________________________________________  ED COURSE:  As part of my medical decision making, I reviewed the following data within the [place] History obtained from family if available, nursing notes, old chart and ekg, as well as notes from prior ED visits. Patient presented for altered mental status, we will assess with labs and imaging as indicated at this time.   Procedures  Jimmy [name] was evaluated in Emergency Department on 09/09/2018 for the symptoms described in the history of present illness. He was evaluated in the context of the global COVID-19 pandemic, which necessitated consideration that the patient might be at risk for infection with the SARS-CoV-2 virus that causes COVID-19. Institutional protocols and algorithms that pertain to the evaluation of patients at risk for COVID-19 are in a state of rapid change based on information released by regulatory bodies including the CDC and federal and state organizations. These policies and algorithms were  followed during the patient's care in the ED.  ____________________________________________   LABS (pertinent positives/negatives)  Labs Reviewed  BASIC METABOLIC PANEL - Abnormal; Notable for the following components:      Result Value   Glucose, Bld 158 (*)    BUN 24 (*)    Creatinine, Ser 1.33 (*)    GFR calc non Af Amer 48 (*)    GFR calc Af Amer 55 (*)    All other components within normal limits  GLUCOSE, CAPILLARY - Abnormal; Notable for the following components:   Glucose-Capillary 146 (*)    All other components within normal limits  CBC  URINALYSIS, COMPLETE (UACMP) WITH MICROSCOPIC    RADIOLOGY CT head IMPRESSION: 1. No CT evidence for acute intracranial abnormality. 2. Atrophy and small vessel ischemic changes of the white matter. Multiple bilateral chronic appearing infarcts.  ____________________________________________   DIFFERENTIAL DIAGNOSIS   CVA, confusion, dehydration, electrolyte abnormality, occult infection  FINAL ASSESSMENT AND PLAN  Altered mental status   Plan: The patient had presented for a period of altered mental status. Patient's labs were reassuring. Patient's imaging not reveal any acute process.  He has no acute neurologic symptoms or abnormalities on my examination.  Patient states he wants to go home when he has no complaints other than poor appetite.  I will prescribe Marinol for appetite stimulation.   Laurence Aly, MD    Note: This note was generated in part or whole with voice recognition software. Voice recognition is usually quite accurate but there are transcription errors that can and very often do occur. I apologize for any typographical errors that were not detected and corrected.     Earleen Newport, MD 09/09/18 Valerie Roys

## 2018-09-09 NOTE — ED Notes (Signed)
Pt states his eustachian tube is backed up- pt is alert and oriented x4- answers questions appropriately

## 2018-09-09 NOTE — ED Notes (Signed)
Pts wife called and given an update with verbal permission from pt [phone]

## 2018-09-09 NOTE — ED Triage Notes (Signed)
Pt in [facility] from home with c/o possible CVA. EMS reports per wife, pt stopped talking to her at 12pm and she thinks he may be having a stroke. EMS reports no new neuro deficits from previous stroke on the left side of his body. Pt with slurred speech that wife reports is normal. BP's 130's , HR 90's.

## 2018-09-09 NOTE — ED Notes (Signed)
PT telling this RN that he does not believe his speech is normal but when asked when he felt "normal" pt reports "6 hours ago." Pt also reports having fallen from bed this morning but denies hitting head.   Pt has no new neuro deficits upon this RNs assessment and is answering all questions appropriately.

## 2018-09-09 NOTE — ED Notes (Signed)
Called wife for transport

## 2018-09-10 ENCOUNTER — Other Ambulatory Visit: Payer: Self-pay | Admitting: Family Medicine

## 2018-09-10 DIAGNOSIS — Z951 Presence of aortocoronary bypass graft: Secondary | ICD-10-CM | POA: Diagnosis not present

## 2018-09-10 DIAGNOSIS — I69328 Other speech and language deficits following cerebral infarction: Secondary | ICD-10-CM | POA: Diagnosis not present

## 2018-09-10 DIAGNOSIS — I5032 Chronic diastolic (congestive) heart failure: Secondary | ICD-10-CM | POA: Diagnosis not present

## 2018-09-10 DIAGNOSIS — I130 Hypertensive heart and chronic kidney disease with heart failure and stage 1 through stage 4 chronic kidney disease, or unspecified chronic kidney disease: Secondary | ICD-10-CM | POA: Diagnosis not present

## 2018-09-10 DIAGNOSIS — I69354 Hemiplegia and hemiparesis following cerebral infarction affecting left non-dominant side: Secondary | ICD-10-CM | POA: Diagnosis not present

## 2018-09-10 DIAGNOSIS — N183 Chronic kidney disease, stage 3 (moderate): Secondary | ICD-10-CM | POA: Diagnosis not present

## 2018-09-10 MED ORDER — DIPHENHYDRAMINE HCL 25 MG PO TABS: 25.0000 mg | ORAL_TABLET | Freq: Every evening | ORAL | Status: DC | PRN

## 2018-09-11 ENCOUNTER — Ambulatory Visit: Payer: Medicare Other | Admitting: Adult Health

## 2018-09-14 ENCOUNTER — Telehealth: Payer: Self-pay

## 2018-09-14 DIAGNOSIS — Z951 Presence of aortocoronary bypass graft: Secondary | ICD-10-CM | POA: Diagnosis not present

## 2018-09-14 DIAGNOSIS — N183 Chronic kidney disease, stage 3 (moderate): Secondary | ICD-10-CM | POA: Diagnosis not present

## 2018-09-14 DIAGNOSIS — I5032 Chronic diastolic (congestive) heart failure: Secondary | ICD-10-CM | POA: Diagnosis not present

## 2018-09-14 DIAGNOSIS — I69354 Hemiplegia and hemiparesis following cerebral infarction affecting left non-dominant side: Secondary | ICD-10-CM | POA: Diagnosis not present

## 2018-09-14 DIAGNOSIS — I69328 Other speech and language deficits following cerebral infarction: Secondary | ICD-10-CM | POA: Diagnosis not present

## 2018-09-14 DIAGNOSIS — I130 Hypertensive heart and chronic kidney disease with heart failure and stage 1 through stage 4 chronic kidney disease, or unspecified chronic kidney disease: Secondary | ICD-10-CM | POA: Diagnosis not present

## 2018-09-14 NOTE — Telephone Encounter (Signed)
Amy speech therapist with [facility] left v/m requesting verbal orders for [facility] speech therapy 2 x a wk for 4 wks.Please advise.

## 2018-09-14 NOTE — Telephone Encounter (Signed)
Agree with this. Thanks.  

## 2018-09-15 NOTE — Telephone Encounter (Signed)
Spoke with Amy, of [facility], informing her Dr. Darnell Level is giving verbal orders for requested services.  Verbalizes understanding.

## 2018-09-17 DIAGNOSIS — I69328 Other speech and language deficits following cerebral infarction: Secondary | ICD-10-CM | POA: Diagnosis not present

## 2018-09-17 DIAGNOSIS — I5032 Chronic diastolic (congestive) heart failure: Secondary | ICD-10-CM | POA: Diagnosis not present

## 2018-09-17 DIAGNOSIS — I69354 Hemiplegia and hemiparesis following cerebral infarction affecting left non-dominant side: Secondary | ICD-10-CM | POA: Diagnosis not present

## 2018-09-17 DIAGNOSIS — N183 Chronic kidney disease, stage 3 (moderate): Secondary | ICD-10-CM | POA: Diagnosis not present

## 2018-09-17 DIAGNOSIS — I130 Hypertensive heart and chronic kidney disease with heart failure and stage 1 through stage 4 chronic kidney disease, or unspecified chronic kidney disease: Secondary | ICD-10-CM | POA: Diagnosis not present

## 2018-09-17 DIAGNOSIS — Z951 Presence of aortocoronary bypass graft: Secondary | ICD-10-CM | POA: Diagnosis not present

## 2018-09-18 DIAGNOSIS — I69354 Hemiplegia and hemiparesis following cerebral infarction affecting left non-dominant side: Secondary | ICD-10-CM | POA: Diagnosis not present

## 2018-09-18 DIAGNOSIS — I5032 Chronic diastolic (congestive) heart failure: Secondary | ICD-10-CM | POA: Diagnosis not present

## 2018-09-18 DIAGNOSIS — Z951 Presence of aortocoronary bypass graft: Secondary | ICD-10-CM | POA: Diagnosis not present

## 2018-09-18 DIAGNOSIS — I63412 Cerebral infarction due to embolism of left middle cerebral artery: Secondary | ICD-10-CM | POA: Diagnosis not present

## 2018-09-18 DIAGNOSIS — I130 Hypertensive heart and chronic kidney disease with heart failure and stage 1 through stage 4 chronic kidney disease, or unspecified chronic kidney disease: Secondary | ICD-10-CM | POA: Diagnosis not present

## 2018-09-18 DIAGNOSIS — I69328 Other speech and language deficits following cerebral infarction: Secondary | ICD-10-CM | POA: Diagnosis not present

## 2018-09-18 DIAGNOSIS — N183 Chronic kidney disease, stage 3 (moderate): Secondary | ICD-10-CM | POA: Diagnosis not present

## 2018-09-21 DIAGNOSIS — N183 Chronic kidney disease, stage 3 (moderate): Secondary | ICD-10-CM | POA: Diagnosis not present

## 2018-09-21 DIAGNOSIS — I5032 Chronic diastolic (congestive) heart failure: Secondary | ICD-10-CM | POA: Diagnosis not present

## 2018-09-21 DIAGNOSIS — I69328 Other speech and language deficits following cerebral infarction: Secondary | ICD-10-CM | POA: Diagnosis not present

## 2018-09-21 DIAGNOSIS — I69354 Hemiplegia and hemiparesis following cerebral infarction affecting left non-dominant side: Secondary | ICD-10-CM | POA: Diagnosis not present

## 2018-09-21 DIAGNOSIS — I130 Hypertensive heart and chronic kidney disease with heart failure and stage 1 through stage 4 chronic kidney disease, or unspecified chronic kidney disease: Secondary | ICD-10-CM | POA: Diagnosis not present

## 2018-09-21 DIAGNOSIS — Z951 Presence of aortocoronary bypass graft: Secondary | ICD-10-CM

## 2018-09-21 DIAGNOSIS — Z87891 Personal history of nicotine dependence: Secondary | ICD-10-CM | POA: Diagnosis not present

## 2018-09-22 DIAGNOSIS — Z951 Presence of aortocoronary bypass graft: Secondary | ICD-10-CM | POA: Diagnosis not present

## 2018-09-22 DIAGNOSIS — I5032 Chronic diastolic (congestive) heart failure: Secondary | ICD-10-CM | POA: Diagnosis not present

## 2018-09-22 DIAGNOSIS — I130 Hypertensive heart and chronic kidney disease with heart failure and stage 1 through stage 4 chronic kidney disease, or unspecified chronic kidney disease: Secondary | ICD-10-CM | POA: Diagnosis not present

## 2018-09-22 DIAGNOSIS — N183 Chronic kidney disease, stage 3 (moderate): Secondary | ICD-10-CM | POA: Diagnosis not present

## 2018-09-22 DIAGNOSIS — I69328 Other speech and language deficits following cerebral infarction: Secondary | ICD-10-CM | POA: Diagnosis not present

## 2018-09-22 DIAGNOSIS — I69354 Hemiplegia and hemiparesis following cerebral infarction affecting left non-dominant side: Secondary | ICD-10-CM | POA: Diagnosis not present

## 2018-09-23 DIAGNOSIS — N183 Chronic kidney disease, stage 3 (moderate): Secondary | ICD-10-CM | POA: Diagnosis not present

## 2018-09-23 DIAGNOSIS — I69354 Hemiplegia and hemiparesis following cerebral infarction affecting left non-dominant side: Secondary | ICD-10-CM | POA: Diagnosis not present

## 2018-09-23 DIAGNOSIS — Z951 Presence of aortocoronary bypass graft: Secondary | ICD-10-CM | POA: Diagnosis not present

## 2018-09-23 DIAGNOSIS — I5032 Chronic diastolic (congestive) heart failure: Secondary | ICD-10-CM | POA: Diagnosis not present

## 2018-09-23 DIAGNOSIS — I69328 Other speech and language deficits following cerebral infarction: Secondary | ICD-10-CM | POA: Diagnosis not present

## 2018-09-23 DIAGNOSIS — I130 Hypertensive heart and chronic kidney disease with heart failure and stage 1 through stage 4 chronic kidney disease, or unspecified chronic kidney disease: Secondary | ICD-10-CM | POA: Diagnosis not present

## 2018-09-24 ENCOUNTER — Other Ambulatory Visit: Payer: Self-pay

## 2018-09-24 ENCOUNTER — Encounter: Payer: Medicare Other | Attending: Physical Medicine & Rehabilitation | Admitting: Physical Medicine & Rehabilitation

## 2018-09-24 ENCOUNTER — Encounter: Payer: Self-pay | Admitting: Physical Medicine & Rehabilitation

## 2018-09-24 VITALS — BP 113/72 | HR 75 | Temp 97.9°F

## 2018-09-24 DIAGNOSIS — N183 Chronic kidney disease, stage 3 (moderate): Secondary | ICD-10-CM | POA: Diagnosis not present

## 2018-09-24 DIAGNOSIS — I69354 Hemiplegia and hemiparesis following cerebral infarction affecting left non-dominant side: Secondary | ICD-10-CM | POA: Diagnosis not present

## 2018-09-24 DIAGNOSIS — I5032 Chronic diastolic (congestive) heart failure: Secondary | ICD-10-CM | POA: Diagnosis not present

## 2018-09-24 DIAGNOSIS — I130 Hypertensive heart and chronic kidney disease with heart failure and stage 1 through stage 4 chronic kidney disease, or unspecified chronic kidney disease: Secondary | ICD-10-CM | POA: Diagnosis not present

## 2018-09-24 DIAGNOSIS — I69398 Other sequelae of cerebral infarction: Secondary | ICD-10-CM | POA: Diagnosis not present

## 2018-09-24 DIAGNOSIS — Z8673 Personal history of transient ischemic attack (TIA), and cerebral infarction without residual deficits: Secondary | ICD-10-CM

## 2018-09-24 DIAGNOSIS — R269 Unspecified abnormalities of gait and mobility: Secondary | ICD-10-CM | POA: Diagnosis not present

## 2018-09-24 DIAGNOSIS — I69328 Other speech and language deficits following cerebral infarction: Secondary | ICD-10-CM | POA: Diagnosis not present

## 2018-09-24 DIAGNOSIS — I6521 Occlusion and stenosis of right carotid artery: Secondary | ICD-10-CM | POA: Diagnosis not present

## 2018-09-24 DIAGNOSIS — Z951 Presence of aortocoronary bypass graft: Secondary | ICD-10-CM | POA: Diagnosis not present

## 2018-09-24 NOTE — Progress Notes (Signed)
Subjective:    [name], male    DOB: 15-May-1928, 85 y.o.   MRN: [id number]  HPI 85 year old male with history of coronary artery disease status post bypass, chronic kidney disease, CLL, diastolic CHF and left internal capsule infarct suffered a right lentiform hemorrhage on 04/04/2018.  Last seen in clinic approximately 6 weeks ago.  Left hemiparesis has been improving.  No further falls. Left hand seems to be loosening up. Interval medical history ED visit for altered mental status on 09/09/2018, repeat CT scan showed no new infarct. Patient is independent with his dressing for the most part. Still receiving home health therapy Pain Inventory Average Pain 4 Pain Right Now 0 My pain is sharp  In the last 24 hours, has pain interfered with the following? General activity 0 Relation with others 0 Enjoyment of life 0 What TIME of day is your pain at its worst? varies Sleep (in general) Good  Pain is worse with: standing Pain improves with: rest Relief from Meds: na  Mobility walk with assistance use a walker ability to climb steps?  no do you drive?  no  Function retired I need assistance with the following:  dressing, bathing and meal prep  Neuro/Psych weakness trouble walking  Prior Studies CT/MRI  Physicians involved in your care Primary care Kathlene Cote, MD Neurologist Dr. Brigitte Pulse   Family History  Problem Relation Age of Onset  . Hypertension Mother   . Heart disease Mother   . Hyperlipidemia Mother   . Hypertension Father   . Hyperlipidemia Father   . Kidney cancer Sister        Renal cell cancer  . Alcohol abuse Brother   . Diabetes Brother   . Stroke Brother   . Heart attack Brother        MI  . Diabetes Other        Sister's daughter  . Depression Daughter        Bipolar   Social History   Socioeconomic History  . Marital status: Married    Spouse name: Not on file  . Number of children: Not on file  . Years of  education: Not on file  . Highest education level: Not on file  Occupational History  . Occupation: Retired Information systems manager: RETIRED  Social Needs  . Financial resource strain: Not on file  . Food insecurity    Worry: Not on file    Inability: Not on file  . Transportation needs    Medical: Not on file    Non-medical: Not on file  Tobacco Use  . Smoking status: Former Smoker    Packs/day: 2.00    Years: 30.00    Pack years: 60.00    Quit date: 02/28/1979    Years since quitting: 39.5  . Smokeless tobacco: Never Used  . Tobacco comment: quit 1980  Substance and Sexual Activity  . Alcohol use: No    Alcohol/week: 0.0 standard drinks    Comment: occasional wine  . Drug use: Never  . Sexual activity: Yes  Lifestyle  . Physical activity    Days per week: Not on file    Minutes per session: Not on file  . Stress: Not on file  Relationships  . Social Herbalist on phone: Not on file    Gets together: Not on file    Attends religious service: Not on file    Active member of club or organization: Not  on file    Attends meetings of clubs or organizations: Not on file    Relationship status: Not on file  Other Topics Concern  . Not on file  Social History Narrative   Married and lives with wife   1 son died of lung cancer at 4 years old   85 daughter bipolar, lives    Activity: walks 1 mi daily on treadmill    Diet: good water, some fruits/vegetables    Past Surgical History:  Procedure Laterality Date  . ANGIOPLASTY  1998  . CATARACT EXTRACTION, BILATERAL     R 1/09, L 8/09  . COLONOSCOPY  11/29/1987   Normal  . COLONOSCOPY  02/07/2003   Hemm. Internal, focal proctitis, negative biopsy  . COLONOSCOPY  12/12/2004   Internal external hemorrhoids, +proctits, negative biopsy  . CORONARY ANGIOPLASTY  1998  . CORONARY ARTERY BYPASS GRAFT  04/23/2001   x4, Dr. Pia Mau  . ESOPHAGOGASTRODUODENOSCOPY  11/29/1987   Gastritis  . ETT  12/10/2006    Persantine myoview nml  . HEMORROIDECTOMY  1954   Fissure repair, [place]  . HEPATIC ARTERY ANGIOPLASTY  1954   [place], [place]  . KIDNEY STONE SURGERY  1977   Dr Redmond Baseman  . LITHOTRIPSY  1990s   Multiple  . US ECHOCARDIOGRAPHY  07/2011   nl sys fxn, EF 55-60%, grade 1 diast dysfunction, mild AS, mildly elevated PA pressure   Past Medical History:  Diagnosis Date  . CAD (coronary artery disease)    a. 04/2001 S/P CABGx 4;  b. 2008 MV:  EF 63% normal perfusion.  . Carotid stenosis    a. 11/2012 Carotid U/S:  RICA 50-38%, LICA 88-28%.  . Chronic kidney disease, stage 3 ([place])   . CLL (chronic lymphoblastic leukemia) 2009   Stage IV; Dr. Ivor Messier - referred to Dr. Lissa Merlin [facility] 07/2013 now on Rituxan (10/2013) --> stage 0 (09/2014)  . Diastolic CHF ([place])    a. 0/0349 Echo: EF 55-65%, mild conc LVH, Gr 1 DD, mild AS, triv AI, mildly dil Ao root (3.5 cm).  . GERD (gastroesophageal reflux disease) 1990s  . History of CVA (cerebrovascular accident) 2015   by MRI - remote L internal capsule  . History of herpes genitalis    valtrex daily  . Hyperlipemia 2002  . Hypertension 2002  . Mass of submandibular region 2015   referred to gen surg after chemo  . Mild aortic stenosis    a. 07/2011 Echo: Mild AS, triv AI.  [name] Stroke ([place])   . Thrombocytopenia ([place])    outpatient goals Hgb >9, plt >20  . TIA (transient ischemic attack)    04/2016   BP 113/72   Pulse 75   Temp 97.9 F (36.6 C)   SpO2 97%   Opioid Risk Score:   Fall Risk Score:  `1  Depression screen PHQ 2/9  Depression screen [facility] 2/9 06/17/2018 05/29/2018 10/21/2017 10/11/2016 09/20/2015 09/16/2014 08/30/2013  Decreased Interest 0 0 0 0 0 0 0  Down, Depressed, Hopeless 0 0 0 0 0 0 0  PHQ - 2 Score 0 0 0 0 0 0 0  Altered sleeping - - 0 - - - -  Tired, decreased energy - - 0 - - - -  Change in appetite - - 0 - - - -  Feeling bad or failure about yourself  - - 0 - - - -  Trouble concentrating - - 0 - - - -  Moving slowly or  fidgety/restless - - 0 - - - -  Suicidal thoughts - - 0 - - - -  PHQ-9 Score - - 0 - - - -  Difficult doing work/chores - - Not difficult at all - - - -  Some recent data might be hidden    Review of Systems  Constitutional: Negative.   HENT: Negative.   Eyes: Negative.   Respiratory: Positive for cough.   Cardiovascular: Negative.   Gastrointestinal: Negative.   Endocrine: Negative.   Genitourinary: Negative.   Musculoskeletal: Negative.   Skin: Negative.   Allergic/Immunologic: Negative.   Neurological: Negative.   Hematological: Negative.   Psychiatric/Behavioral: Negative.   All other systems reviewed and are negative.      Objective:   Physical Exam Constitutional:      Appearance: He is normal weight.  HENT:     Head: Normocephalic and atraumatic.     Mouth/Throat:     Mouth: Mucous membranes are dry.  Cardiovascular:     Rate and Rhythm: Normal rate and regular rhythm.     Heart sounds: No murmur.  Pulmonary:     Effort: Pulmonary effort is normal. No respiratory distress.     Breath sounds: Normal breath sounds. No stridor. No wheezing or rhonchi.  Abdominal:     General: Abdomen is flat. Bowel sounds are normal. There is no distension.     Palpations: Abdomen is soft.  Neurological:     Mental Status: He is alert and oriented to person, place, and time.     Cranial Nerves: No dysarthria.     Motor: Tremor present.     Coordination: Coordination normal.     Gait: Gait abnormal.     Comments: There is mild tremor in both upper extremities.  Psychiatric:        Mood and Affect: Mood normal.        Behavior: Behavior normal.    Motor strength is 5/5 bilateral deltoid bicep tricep grip hip flexor knee extensor ankle dorsiflexion His balance is poor he tends to lean backward has a widened base support.       Assessment & Plan:  1.  History of right lentiform hemorrhage, last CT showed resolution but does have some residual balance deficits.  His  strength has largely improved. Overall has made a good recovery although given his age and the fact that his hemorrhage was approximately 6 months ago I do not think he will get back to premorbid functional status. Continue home health therapy, this will be followed up by outpatient therapy I will see him back in approximately 6 to 8 weeks. I discussed my impressions with both the patient and his wife.

## 2018-09-24 NOTE — Patient Instructions (Signed)
Disability parking placard form for permanent completed today

## 2018-09-25 DIAGNOSIS — I130 Hypertensive heart and chronic kidney disease with heart failure and stage 1 through stage 4 chronic kidney disease, or unspecified chronic kidney disease: Secondary | ICD-10-CM | POA: Diagnosis not present

## 2018-09-25 DIAGNOSIS — I69354 Hemiplegia and hemiparesis following cerebral infarction affecting left non-dominant side: Secondary | ICD-10-CM | POA: Diagnosis not present

## 2018-09-25 DIAGNOSIS — I69328 Other speech and language deficits following cerebral infarction: Secondary | ICD-10-CM | POA: Diagnosis not present

## 2018-09-25 DIAGNOSIS — N183 Chronic kidney disease, stage 3 (moderate): Secondary | ICD-10-CM | POA: Diagnosis not present

## 2018-09-25 DIAGNOSIS — Z951 Presence of aortocoronary bypass graft: Secondary | ICD-10-CM | POA: Diagnosis not present

## 2018-09-25 DIAGNOSIS — I5032 Chronic diastolic (congestive) heart failure: Secondary | ICD-10-CM | POA: Diagnosis not present

## 2018-09-28 DIAGNOSIS — I69354 Hemiplegia and hemiparesis following cerebral infarction affecting left non-dominant side: Secondary | ICD-10-CM | POA: Diagnosis not present

## 2018-09-28 DIAGNOSIS — I69328 Other speech and language deficits following cerebral infarction: Secondary | ICD-10-CM | POA: Diagnosis not present

## 2018-09-28 DIAGNOSIS — N183 Chronic kidney disease, stage 3 (moderate): Secondary | ICD-10-CM | POA: Diagnosis not present

## 2018-09-28 DIAGNOSIS — I130 Hypertensive heart and chronic kidney disease with heart failure and stage 1 through stage 4 chronic kidney disease, or unspecified chronic kidney disease: Secondary | ICD-10-CM | POA: Diagnosis not present

## 2018-09-28 DIAGNOSIS — I5032 Chronic diastolic (congestive) heart failure: Secondary | ICD-10-CM | POA: Diagnosis not present

## 2018-09-28 DIAGNOSIS — Z951 Presence of aortocoronary bypass graft: Secondary | ICD-10-CM | POA: Diagnosis not present

## 2018-09-29 ENCOUNTER — Ambulatory Visit: Payer: Medicare Other | Admitting: Adult Health

## 2018-09-29 DIAGNOSIS — I5032 Chronic diastolic (congestive) heart failure: Secondary | ICD-10-CM | POA: Diagnosis not present

## 2018-09-29 DIAGNOSIS — Z951 Presence of aortocoronary bypass graft: Secondary | ICD-10-CM | POA: Diagnosis not present

## 2018-09-29 DIAGNOSIS — I69328 Other speech and language deficits following cerebral infarction: Secondary | ICD-10-CM | POA: Diagnosis not present

## 2018-09-29 DIAGNOSIS — N183 Chronic kidney disease, stage 3 (moderate): Secondary | ICD-10-CM | POA: Diagnosis not present

## 2018-09-29 DIAGNOSIS — I130 Hypertensive heart and chronic kidney disease with heart failure and stage 1 through stage 4 chronic kidney disease, or unspecified chronic kidney disease: Secondary | ICD-10-CM | POA: Diagnosis not present

## 2018-09-29 DIAGNOSIS — Z87891 Personal history of nicotine dependence: Secondary | ICD-10-CM | POA: Diagnosis not present

## 2018-09-29 DIAGNOSIS — I69354 Hemiplegia and hemiparesis following cerebral infarction affecting left non-dominant side: Secondary | ICD-10-CM | POA: Diagnosis not present

## 2018-09-30 ENCOUNTER — Telehealth: Payer: Self-pay

## 2018-09-30 DIAGNOSIS — N183 Chronic kidney disease, stage 3 (moderate): Secondary | ICD-10-CM | POA: Diagnosis not present

## 2018-09-30 DIAGNOSIS — Z951 Presence of aortocoronary bypass graft: Secondary | ICD-10-CM | POA: Diagnosis not present

## 2018-09-30 DIAGNOSIS — I69354 Hemiplegia and hemiparesis following cerebral infarction affecting left non-dominant side: Secondary | ICD-10-CM | POA: Diagnosis not present

## 2018-09-30 DIAGNOSIS — I130 Hypertensive heart and chronic kidney disease with heart failure and stage 1 through stage 4 chronic kidney disease, or unspecified chronic kidney disease: Secondary | ICD-10-CM | POA: Diagnosis not present

## 2018-09-30 DIAGNOSIS — I69328 Other speech and language deficits following cerebral infarction: Secondary | ICD-10-CM | POA: Diagnosis not present

## 2018-09-30 DIAGNOSIS — I5032 Chronic diastolic (congestive) heart failure: Secondary | ICD-10-CM | POA: Diagnosis not present

## 2018-09-30 NOTE — Telephone Encounter (Signed)
Noted. Thanks.

## 2018-09-30 NOTE — Telephone Encounter (Signed)
Jimmy [name] OT with [facility] left v/m;pts wife declined [place] OT on 10/01/18 due to holiday.will resume [facility] OT next wk.Jimmy [name] notified we received this phone note. Now Jimmy [name] does not need cb unless PCP has orders. FYI to Dr Darnell Level.

## 2018-10-01 DIAGNOSIS — I69328 Other speech and language deficits following cerebral infarction: Secondary | ICD-10-CM | POA: Diagnosis not present

## 2018-10-01 DIAGNOSIS — I5032 Chronic diastolic (congestive) heart failure: Secondary | ICD-10-CM | POA: Diagnosis not present

## 2018-10-01 DIAGNOSIS — I130 Hypertensive heart and chronic kidney disease with heart failure and stage 1 through stage 4 chronic kidney disease, or unspecified chronic kidney disease: Secondary | ICD-10-CM | POA: Diagnosis not present

## 2018-10-01 DIAGNOSIS — N183 Chronic kidney disease, stage 3 (moderate): Secondary | ICD-10-CM | POA: Diagnosis not present

## 2018-10-01 DIAGNOSIS — Z951 Presence of aortocoronary bypass graft: Secondary | ICD-10-CM | POA: Diagnosis not present

## 2018-10-01 DIAGNOSIS — I69354 Hemiplegia and hemiparesis following cerebral infarction affecting left non-dominant side: Secondary | ICD-10-CM | POA: Diagnosis not present

## 2018-10-02 DIAGNOSIS — I69354 Hemiplegia and hemiparesis following cerebral infarction affecting left non-dominant side: Secondary | ICD-10-CM | POA: Diagnosis not present

## 2018-10-02 DIAGNOSIS — N183 Chronic kidney disease, stage 3 (moderate): Secondary | ICD-10-CM | POA: Diagnosis not present

## 2018-10-02 DIAGNOSIS — Z951 Presence of aortocoronary bypass graft: Secondary | ICD-10-CM | POA: Diagnosis not present

## 2018-10-02 DIAGNOSIS — I69328 Other speech and language deficits following cerebral infarction: Secondary | ICD-10-CM | POA: Diagnosis not present

## 2018-10-02 DIAGNOSIS — I130 Hypertensive heart and chronic kidney disease with heart failure and stage 1 through stage 4 chronic kidney disease, or unspecified chronic kidney disease: Secondary | ICD-10-CM | POA: Diagnosis not present

## 2018-10-02 DIAGNOSIS — I5032 Chronic diastolic (congestive) heart failure: Secondary | ICD-10-CM | POA: Diagnosis not present

## 2018-10-07 DIAGNOSIS — I130 Hypertensive heart and chronic kidney disease with heart failure and stage 1 through stage 4 chronic kidney disease, or unspecified chronic kidney disease: Secondary | ICD-10-CM | POA: Diagnosis not present

## 2018-10-07 DIAGNOSIS — I69354 Hemiplegia and hemiparesis following cerebral infarction affecting left non-dominant side: Secondary | ICD-10-CM | POA: Diagnosis not present

## 2018-10-07 DIAGNOSIS — I69328 Other speech and language deficits following cerebral infarction: Secondary | ICD-10-CM | POA: Diagnosis not present

## 2018-10-07 DIAGNOSIS — N183 Chronic kidney disease, stage 3 (moderate): Secondary | ICD-10-CM | POA: Diagnosis not present

## 2018-10-07 DIAGNOSIS — I5032 Chronic diastolic (congestive) heart failure: Secondary | ICD-10-CM | POA: Diagnosis not present

## 2018-10-07 DIAGNOSIS — Z951 Presence of aortocoronary bypass graft: Secondary | ICD-10-CM | POA: Diagnosis not present

## 2018-10-08 ENCOUNTER — Telehealth: Payer: Self-pay

## 2018-10-08 DIAGNOSIS — I69328 Other speech and language deficits following cerebral infarction: Secondary | ICD-10-CM | POA: Diagnosis not present

## 2018-10-08 DIAGNOSIS — N183 Chronic kidney disease, stage 3 (moderate): Secondary | ICD-10-CM | POA: Diagnosis not present

## 2018-10-08 DIAGNOSIS — I5032 Chronic diastolic (congestive) heart failure: Secondary | ICD-10-CM | POA: Diagnosis not present

## 2018-10-08 DIAGNOSIS — I69354 Hemiplegia and hemiparesis following cerebral infarction affecting left non-dominant side: Secondary | ICD-10-CM | POA: Diagnosis not present

## 2018-10-08 DIAGNOSIS — I130 Hypertensive heart and chronic kidney disease with heart failure and stage 1 through stage 4 chronic kidney disease, or unspecified chronic kidney disease: Secondary | ICD-10-CM | POA: Diagnosis not present

## 2018-10-08 DIAGNOSIS — Z951 Presence of aortocoronary bypass graft: Secondary | ICD-10-CM | POA: Diagnosis not present

## 2018-10-08 NOTE — Telephone Encounter (Signed)
Spoke with [place] asking about pt.  Basically stated same info in previous message:  95% OX, told pt had croup-like cough, temp normal, vitals good.

## 2018-10-08 NOTE — Telephone Encounter (Signed)
Called, unable to reach [place]. Left message. plz touch base with RN regarding patient.

## 2018-10-08 NOTE — Telephone Encounter (Signed)
[place] RN Modena Slater went to see him today and was told he a croup like cough. Lungs sounded clear 95% pulse OX, Wanted to talk to Dr Darnell Level about him.

## 2018-10-09 ENCOUNTER — Telehealth: Payer: Self-pay | Admitting: Family Medicine

## 2018-10-09 DIAGNOSIS — I5032 Chronic diastolic (congestive) heart failure: Secondary | ICD-10-CM | POA: Diagnosis not present

## 2018-10-09 DIAGNOSIS — Z951 Presence of aortocoronary bypass graft: Secondary | ICD-10-CM | POA: Diagnosis not present

## 2018-10-09 DIAGNOSIS — I69354 Hemiplegia and hemiparesis following cerebral infarction affecting left non-dominant side: Secondary | ICD-10-CM | POA: Diagnosis not present

## 2018-10-09 DIAGNOSIS — I130 Hypertensive heart and chronic kidney disease with heart failure and stage 1 through stage 4 chronic kidney disease, or unspecified chronic kidney disease: Secondary | ICD-10-CM | POA: Diagnosis not present

## 2018-10-09 DIAGNOSIS — N183 Chronic kidney disease, stage 3 (moderate): Secondary | ICD-10-CM | POA: Diagnosis not present

## 2018-10-09 DIAGNOSIS — I69328 Other speech and language deficits following cerebral infarction: Secondary | ICD-10-CM | POA: Diagnosis not present

## 2018-10-09 NOTE — Telephone Encounter (Signed)
Left a message as FYI  on voicemail today in addition to RN notes below [place] [phone] [facility] Calling to make Dr G aware that the patient had 1 missed visit for this week for [facility] services.  Pt is supposed to have 2 visits per week and the patient wife cancelled one visit this week d/t scheduling conflict.

## 2018-10-09 NOTE — Telephone Encounter (Signed)
Best number [phone] Clarise Cruz [facility] wanting verbal orders for speech therapy  1 x week for 3 week

## 2018-10-09 NOTE — Telephone Encounter (Signed)
Noted.  Again left message for [place] as well. Advised monitor cough for now, let us know if fever or productive cough develops or worsens - update [place] next week.

## 2018-10-09 NOTE — Telephone Encounter (Signed)
Agree with this.  

## 2018-10-09 NOTE — Telephone Encounter (Signed)
Jimmy [name] with [facility] calling for verbal orders for [facility] services.  Orders for [facility] 1 x wk for 3 weeks Call back is [phone]

## 2018-10-12 DIAGNOSIS — I130 Hypertensive heart and chronic kidney disease with heart failure and stage 1 through stage 4 chronic kidney disease, or unspecified chronic kidney disease: Secondary | ICD-10-CM | POA: Diagnosis not present

## 2018-10-12 DIAGNOSIS — I69328 Other speech and language deficits following cerebral infarction: Secondary | ICD-10-CM | POA: Diagnosis not present

## 2018-10-12 DIAGNOSIS — Z951 Presence of aortocoronary bypass graft: Secondary | ICD-10-CM | POA: Diagnosis not present

## 2018-10-12 DIAGNOSIS — N183 Chronic kidney disease, stage 3 (moderate): Secondary | ICD-10-CM | POA: Diagnosis not present

## 2018-10-12 DIAGNOSIS — I69354 Hemiplegia and hemiparesis following cerebral infarction affecting left non-dominant side: Secondary | ICD-10-CM | POA: Diagnosis not present

## 2018-10-12 DIAGNOSIS — I5032 Chronic diastolic (congestive) heart failure: Secondary | ICD-10-CM | POA: Diagnosis not present

## 2018-10-12 NOTE — Telephone Encounter (Signed)
Spoke with Clarise Cruz informing her Dr. Darnell Level is giving verbal orders for ST requested.  Verbalizes understanding.

## 2018-10-12 NOTE — Telephone Encounter (Signed)
Left message on vm for [facility] informing her Dr. Darnell Level is giving verbal orders for services requested.

## 2018-10-13 ENCOUNTER — Telehealth: Payer: Self-pay

## 2018-10-13 DIAGNOSIS — I5032 Chronic diastolic (congestive) heart failure: Secondary | ICD-10-CM | POA: Diagnosis not present

## 2018-10-13 DIAGNOSIS — I130 Hypertensive heart and chronic kidney disease with heart failure and stage 1 through stage 4 chronic kidney disease, or unspecified chronic kidney disease: Secondary | ICD-10-CM | POA: Diagnosis not present

## 2018-10-13 DIAGNOSIS — I69328 Other speech and language deficits following cerebral infarction: Secondary | ICD-10-CM | POA: Diagnosis not present

## 2018-10-13 DIAGNOSIS — Z951 Presence of aortocoronary bypass graft: Secondary | ICD-10-CM | POA: Diagnosis not present

## 2018-10-13 DIAGNOSIS — I69354 Hemiplegia and hemiparesis following cerebral infarction affecting left non-dominant side: Secondary | ICD-10-CM | POA: Diagnosis not present

## 2018-10-13 DIAGNOSIS — N183 Chronic kidney disease, stage 3 (moderate): Secondary | ICD-10-CM | POA: Diagnosis not present

## 2018-10-13 NOTE — Telephone Encounter (Signed)
Elder Cyphers PT with [facility] left v/m requesting verbal orders to extend [facility] PT for 5 more visits; one visit the wk of 10/26/18 and 4 visits during the month of August.Please advise.

## 2018-10-13 NOTE — Telephone Encounter (Signed)
Spoke with Jeani Hawking informing her Dr. Darnell Level is giving verbal orders for services requested.  She verbalizes understanding.

## 2018-10-13 NOTE — Telephone Encounter (Signed)
Agree with this. Thank you.  

## 2018-10-14 ENCOUNTER — Telehealth: Payer: Self-pay

## 2018-10-14 DIAGNOSIS — Z951 Presence of aortocoronary bypass graft: Secondary | ICD-10-CM | POA: Diagnosis not present

## 2018-10-14 DIAGNOSIS — I69328 Other speech and language deficits following cerebral infarction: Secondary | ICD-10-CM | POA: Diagnosis not present

## 2018-10-14 DIAGNOSIS — I130 Hypertensive heart and chronic kidney disease with heart failure and stage 1 through stage 4 chronic kidney disease, or unspecified chronic kidney disease: Secondary | ICD-10-CM | POA: Diagnosis not present

## 2018-10-14 DIAGNOSIS — N183 Chronic kidney disease, stage 3 (moderate): Secondary | ICD-10-CM | POA: Diagnosis not present

## 2018-10-14 DIAGNOSIS — I69354 Hemiplegia and hemiparesis following cerebral infarction affecting left non-dominant side: Secondary | ICD-10-CM | POA: Diagnosis not present

## 2018-10-14 DIAGNOSIS — I5032 Chronic diastolic (congestive) heart failure: Secondary | ICD-10-CM | POA: Diagnosis not present

## 2018-10-14 NOTE — Telephone Encounter (Signed)
Izora Gala, OT with [facility] called to ask for an order for a soft resting hand and wrist splint and asks that it get done at [facility] in [place]. They have to have a referral/order before they will set up anything with the pt. Their number is [phone]. Nancy's number is [phone].

## 2018-10-14 NOTE — Telephone Encounter (Signed)
Order written and in in Lisa's box.

## 2018-10-15 DIAGNOSIS — I69354 Hemiplegia and hemiparesis following cerebral infarction affecting left non-dominant side: Secondary | ICD-10-CM | POA: Diagnosis not present

## 2018-10-15 DIAGNOSIS — N183 Chronic kidney disease, stage 3 (moderate): Secondary | ICD-10-CM | POA: Diagnosis not present

## 2018-10-15 DIAGNOSIS — I69328 Other speech and language deficits following cerebral infarction: Secondary | ICD-10-CM | POA: Diagnosis not present

## 2018-10-15 DIAGNOSIS — Z951 Presence of aortocoronary bypass graft: Secondary | ICD-10-CM | POA: Diagnosis not present

## 2018-10-15 DIAGNOSIS — I130 Hypertensive heart and chronic kidney disease with heart failure and stage 1 through stage 4 chronic kidney disease, or unspecified chronic kidney disease: Secondary | ICD-10-CM | POA: Diagnosis not present

## 2018-10-15 DIAGNOSIS — I5032 Chronic diastolic (congestive) heart failure: Secondary | ICD-10-CM | POA: Diagnosis not present

## 2018-10-15 NOTE — Telephone Encounter (Signed)
Faxed written order to [facility] at [phone].

## 2018-10-20 ENCOUNTER — Telehealth: Payer: Self-pay | Admitting: Family Medicine

## 2018-10-20 DIAGNOSIS — I130 Hypertensive heart and chronic kidney disease with heart failure and stage 1 through stage 4 chronic kidney disease, or unspecified chronic kidney disease: Secondary | ICD-10-CM | POA: Diagnosis not present

## 2018-10-20 DIAGNOSIS — Z951 Presence of aortocoronary bypass graft: Secondary | ICD-10-CM | POA: Diagnosis not present

## 2018-10-20 DIAGNOSIS — I5032 Chronic diastolic (congestive) heart failure: Secondary | ICD-10-CM | POA: Diagnosis not present

## 2018-10-20 DIAGNOSIS — I69354 Hemiplegia and hemiparesis following cerebral infarction affecting left non-dominant side: Secondary | ICD-10-CM | POA: Diagnosis not present

## 2018-10-20 DIAGNOSIS — I69328 Other speech and language deficits following cerebral infarction: Secondary | ICD-10-CM | POA: Diagnosis not present

## 2018-10-20 DIAGNOSIS — N183 Chronic kidney disease, stage 3 (moderate): Secondary | ICD-10-CM | POA: Diagnosis not present

## 2018-10-20 NOTE — Telephone Encounter (Signed)
[name] with [facility] stated that the patient was suppose to have an appointment one more time this week with the speech therapist. The therapist was unavailable this week . Buelah Manis with Mrs Nygard and they are in agreement to move this appointment to next week.   Rip Harbour also stated she spoke with Mr Caughlin and he sounded really good.    [facility] C/B #  [phone]

## 2018-10-20 NOTE — Telephone Encounter (Signed)
Noted. Thanks.

## 2018-10-21 DIAGNOSIS — N183 Chronic kidney disease, stage 3 (moderate): Secondary | ICD-10-CM | POA: Diagnosis not present

## 2018-10-21 DIAGNOSIS — I69328 Other speech and language deficits following cerebral infarction: Secondary | ICD-10-CM | POA: Diagnosis not present

## 2018-10-21 DIAGNOSIS — I130 Hypertensive heart and chronic kidney disease with heart failure and stage 1 through stage 4 chronic kidney disease, or unspecified chronic kidney disease: Secondary | ICD-10-CM | POA: Diagnosis not present

## 2018-10-21 DIAGNOSIS — I5032 Chronic diastolic (congestive) heart failure: Secondary | ICD-10-CM | POA: Diagnosis not present

## 2018-10-21 DIAGNOSIS — Z951 Presence of aortocoronary bypass graft: Secondary | ICD-10-CM | POA: Diagnosis not present

## 2018-10-21 DIAGNOSIS — I69354 Hemiplegia and hemiparesis following cerebral infarction affecting left non-dominant side: Secondary | ICD-10-CM | POA: Diagnosis not present

## 2018-10-24 ENCOUNTER — Other Ambulatory Visit: Payer: Self-pay | Admitting: Family Medicine

## 2018-10-24 DIAGNOSIS — E559 Vitamin D deficiency, unspecified: Secondary | ICD-10-CM

## 2018-10-24 DIAGNOSIS — C9110 Chronic lymphocytic leukemia of B-cell type not having achieved remission: Secondary | ICD-10-CM

## 2018-10-24 DIAGNOSIS — N183 Chronic kidney disease, stage 3 unspecified: Secondary | ICD-10-CM

## 2018-10-24 DIAGNOSIS — D696 Thrombocytopenia, unspecified: Secondary | ICD-10-CM

## 2018-10-24 DIAGNOSIS — R7303 Prediabetes: Secondary | ICD-10-CM

## 2018-10-24 DIAGNOSIS — E782 Mixed hyperlipidemia: Secondary | ICD-10-CM

## 2018-10-27 ENCOUNTER — Other Ambulatory Visit (INDEPENDENT_AMBULATORY_CARE_PROVIDER_SITE_OTHER): Payer: Medicare Other

## 2018-10-27 DIAGNOSIS — N183 Chronic kidney disease, stage 3 (moderate): Secondary | ICD-10-CM | POA: Diagnosis not present

## 2018-10-27 DIAGNOSIS — E782 Mixed hyperlipidemia: Secondary | ICD-10-CM

## 2018-10-27 DIAGNOSIS — I69328 Other speech and language deficits following cerebral infarction: Secondary | ICD-10-CM | POA: Diagnosis not present

## 2018-10-27 DIAGNOSIS — I69354 Hemiplegia and hemiparesis following cerebral infarction affecting left non-dominant side: Secondary | ICD-10-CM | POA: Diagnosis not present

## 2018-10-27 DIAGNOSIS — C9110 Chronic lymphocytic leukemia of B-cell type not having achieved remission: Secondary | ICD-10-CM | POA: Diagnosis not present

## 2018-10-27 DIAGNOSIS — E559 Vitamin D deficiency, unspecified: Secondary | ICD-10-CM | POA: Diagnosis not present

## 2018-10-27 DIAGNOSIS — I5032 Chronic diastolic (congestive) heart failure: Secondary | ICD-10-CM | POA: Diagnosis not present

## 2018-10-27 DIAGNOSIS — Z951 Presence of aortocoronary bypass graft: Secondary | ICD-10-CM | POA: Diagnosis not present

## 2018-10-27 DIAGNOSIS — I130 Hypertensive heart and chronic kidney disease with heart failure and stage 1 through stage 4 chronic kidney disease, or unspecified chronic kidney disease: Secondary | ICD-10-CM | POA: Diagnosis not present

## 2018-10-27 LAB — TSH: TSH: 4.33 u[IU]/mL (ref 0.35–4.50)

## 2018-10-27 LAB — COMPREHENSIVE METABOLIC PANEL
ALT: 13 U/L (ref 0–53)
AST: 13 U/L (ref 0–37)
Albumin: 4.1 g/dL (ref 3.5–5.2)
Alkaline Phosphatase: 119 U/L — ABNORMAL HIGH (ref 39–117)
BUN: 22 mg/dL (ref 6–23)
CO2: 31 mEq/L (ref 19–32)
Calcium: 9.9 mg/dL (ref 8.4–10.5)
Chloride: 103 mEq/L (ref 96–112)
Creatinine, Ser: 1.25 mg/dL (ref 0.40–1.50)
GFR: 54.56 mL/min — ABNORMAL LOW (ref >60.00)
Glucose, Bld: 142 mg/dL — ABNORMAL HIGH (ref 70–99)
Potassium: 4.1 mEq/L (ref 3.5–5.1)
Sodium: 141 mEq/L (ref 135–145)
Total Bilirubin: 0.6 mg/dL (ref 0.2–1.2)
Total Protein: 6.3 g/dL (ref 6.0–8.3)

## 2018-10-27 LAB — URIC ACID: Uric Acid, Serum: 4.5 mg/dL (ref 4.0–7.8)

## 2018-10-27 LAB — VITAMIN D 25 HYDROXY (VIT D DEFICIENCY, FRACTURES): VITD: 65.82 ng/mL (ref 30.00–100.00)

## 2018-10-28 ENCOUNTER — Ambulatory Visit: Payer: Medicare Other

## 2018-10-28 ENCOUNTER — Telehealth: Payer: Self-pay | Admitting: Family Medicine

## 2018-10-28 DIAGNOSIS — I5032 Chronic diastolic (congestive) heart failure: Secondary | ICD-10-CM | POA: Diagnosis not present

## 2018-10-28 DIAGNOSIS — Z951 Presence of aortocoronary bypass graft: Secondary | ICD-10-CM | POA: Diagnosis not present

## 2018-10-28 DIAGNOSIS — I130 Hypertensive heart and chronic kidney disease with heart failure and stage 1 through stage 4 chronic kidney disease, or unspecified chronic kidney disease: Secondary | ICD-10-CM | POA: Diagnosis not present

## 2018-10-28 DIAGNOSIS — N183 Chronic kidney disease, stage 3 (moderate): Secondary | ICD-10-CM | POA: Diagnosis not present

## 2018-10-28 DIAGNOSIS — I69354 Hemiplegia and hemiparesis following cerebral infarction affecting left non-dominant side: Secondary | ICD-10-CM | POA: Diagnosis not present

## 2018-10-28 DIAGNOSIS — I69328 Other speech and language deficits following cerebral infarction: Secondary | ICD-10-CM | POA: Diagnosis not present

## 2018-10-28 NOTE — Telephone Encounter (Signed)
Spoke with Jimmy [name] informing her Dr. Darnell Level is giving verbal orders for services requested.  Verbalizes understanding.

## 2018-10-28 NOTE — Telephone Encounter (Signed)
Jimmy [name], Jimmy [name], called.  She's requesting Home Health Aid 1 x a wk 1 wk, 2 x wk for 3 wks.  1 x wk 1 wk.. Jimmy [name]'s voice mail is secure, so you can leave a detailed message.

## 2018-10-29 DIAGNOSIS — R7303 Prediabetes: Secondary | ICD-10-CM | POA: Diagnosis not present

## 2018-10-29 DIAGNOSIS — I2510 Atherosclerotic heart disease of native coronary artery without angina pectoris: Secondary | ICD-10-CM | POA: Diagnosis not present

## 2018-10-29 DIAGNOSIS — Z7982 Long term (current) use of aspirin: Secondary | ICD-10-CM | POA: Diagnosis not present

## 2018-10-29 DIAGNOSIS — E782 Mixed hyperlipidemia: Secondary | ICD-10-CM | POA: Diagnosis not present

## 2018-10-29 DIAGNOSIS — Z87891 Personal history of nicotine dependence: Secondary | ICD-10-CM | POA: Diagnosis not present

## 2018-10-29 DIAGNOSIS — I130 Hypertensive heart and chronic kidney disease with heart failure and stage 1 through stage 4 chronic kidney disease, or unspecified chronic kidney disease: Secondary | ICD-10-CM | POA: Diagnosis not present

## 2018-10-29 DIAGNOSIS — I69328 Other speech and language deficits following cerebral infarction: Secondary | ICD-10-CM | POA: Diagnosis not present

## 2018-10-29 DIAGNOSIS — N183 Chronic kidney disease, stage 3 (moderate): Secondary | ICD-10-CM | POA: Diagnosis not present

## 2018-10-29 DIAGNOSIS — I69391 Dysphagia following cerebral infarction: Secondary | ICD-10-CM | POA: Diagnosis not present

## 2018-10-29 DIAGNOSIS — I69354 Hemiplegia and hemiparesis following cerebral infarction affecting left non-dominant side: Secondary | ICD-10-CM | POA: Diagnosis not present

## 2018-10-29 DIAGNOSIS — I5032 Chronic diastolic (congestive) heart failure: Secondary | ICD-10-CM | POA: Diagnosis not present

## 2018-10-29 DIAGNOSIS — Z951 Presence of aortocoronary bypass graft: Secondary | ICD-10-CM | POA: Diagnosis not present

## 2018-10-29 DIAGNOSIS — C9110 Chronic lymphocytic leukemia of B-cell type not having achieved remission: Secondary | ICD-10-CM | POA: Diagnosis not present

## 2018-10-29 DIAGNOSIS — R1310 Dysphagia, unspecified: Secondary | ICD-10-CM | POA: Diagnosis not present

## 2018-10-30 ENCOUNTER — Telehealth: Payer: Self-pay

## 2018-10-30 ENCOUNTER — Ambulatory Visit (INDEPENDENT_AMBULATORY_CARE_PROVIDER_SITE_OTHER): Payer: Medicare Other

## 2018-10-30 VITALS — BP 130/70 | Ht 69.5 in | Wt 155.0 lb

## 2018-10-30 DIAGNOSIS — Z0000 Encounter for general adult medical examination without abnormal findings: Secondary | ICD-10-CM | POA: Diagnosis not present

## 2018-10-30 DIAGNOSIS — I130 Hypertensive heart and chronic kidney disease with heart failure and stage 1 through stage 4 chronic kidney disease, or unspecified chronic kidney disease: Secondary | ICD-10-CM | POA: Diagnosis not present

## 2018-10-30 DIAGNOSIS — I2510 Atherosclerotic heart disease of native coronary artery without angina pectoris: Secondary | ICD-10-CM | POA: Diagnosis not present

## 2018-10-30 DIAGNOSIS — I69391 Dysphagia following cerebral infarction: Secondary | ICD-10-CM | POA: Diagnosis not present

## 2018-10-30 DIAGNOSIS — I69328 Other speech and language deficits following cerebral infarction: Secondary | ICD-10-CM | POA: Diagnosis not present

## 2018-10-30 DIAGNOSIS — I69354 Hemiplegia and hemiparesis following cerebral infarction affecting left non-dominant side: Secondary | ICD-10-CM | POA: Diagnosis not present

## 2018-10-30 DIAGNOSIS — R1310 Dysphagia, unspecified: Secondary | ICD-10-CM | POA: Diagnosis not present

## 2018-10-30 NOTE — Telephone Encounter (Addendum)
Pt called and just missed phone call. Pt will wait for cb.call may have come from [employer]. I spoke with Dr Valinda Hoar CMA and Rosaria Ferries [facility] and no one tried to call pt.

## 2018-10-30 NOTE — Progress Notes (Signed)
Subjective:   Jimmy [name] is a 85 y.o. male who presents for Medicare Annual/Subsequent preventive examination.  This visit type was conducted due to national recommendations for restrictions regarding the COVID-19 Pandemic (e.g. social distancing). This format is felt to be most appropriate for this patient at this time. All issues noted in this document were discussed and addressed. No physical exam was performed (except for noted visual exam findings with Video Visits). This patient, Jimmy [name], has given permission to perform this visit via telephone. Vital signs may be absent or patient reported.  Patient location:  Nurse location:  Office     Review of Systems:  n/a Cardiac Risk Factors include: advanced age (>57men, >53 women);dyslipidemia;hypertension;male gender     Objective:    Vitals: BP 130/70 Comment: per patient  Ht 5' 9.5" (1.765 m) Comment: per patient  Wt 155 lb (70.3 kg) Comment: per patient  BMI 22.56 kg/m   Body mass index is 22.56 kg/m.  Advanced Directives 10/30/2018 08/28/2018 08/27/2018 04/07/2018 04/06/2018 10/21/2017 10/11/2016  Does Patient Have a Medical Advance Directive? Yes Yes No Yes Yes Yes Yes  Type of Paramedic of [place];Living will [place];Living will - Living will Living will [place];Living will [place];Living will  Does patient want to make changes to medical advance directive? - No - Patient declined - No - Patient declined No - Patient declined - No - Patient declined  Copy of [place] in Chart? - No - copy requested - - - No - copy requested Yes    Tobacco Social History   Tobacco Use  Smoking Status Former Smoker  . Packs/day: 2.00  . Years: 30.00  . Pack years: 60.00  . Quit date: 02/28/1979  . Years since quitting: 39.6  Smokeless Tobacco Never Used  Tobacco Comment   quit 1980     Counseling given: Not  Answered Comment: quit 1980   Clinical Intake:  Pre-visit preparation completed: Yes  Pain : No/denies pain     Nutritional Status: BMI of 19-24  Normal Nutritional Risks: None Diabetes: No  How often do you need to have someone help you when you read instructions, pamphlets, or other written materials from your doctor or pharmacy?: 1 - Never What is the last grade level you completed in school?: 1 year college  Interpreter Needed?: No  Information entered by :: NAllen LPN  Past Medical History:  Diagnosis Date  . CAD (coronary artery disease)    a. 04/2001 S/P CABGx 4;  b. 2008 MV:  EF 63% normal perfusion.  . Carotid stenosis    a. 11/2012 Carotid U/S:  RICA 46-27%, LICA 03-50%.  . Chronic kidney disease, stage 3 ([place])   . CLL (chronic lymphoblastic leukemia) 2009   Stage IV; Dr. Ivor Messier - referred to Dr. Lissa Merlin [facility] 07/2013 now on Rituxan (10/2013) --> stage 0 (09/2014)  . Diastolic CHF ([place])    a. 0/9381 Echo: EF 55-65%, mild conc LVH, Gr 1 DD, mild AS, triv AI, mildly dil Ao root (3.5 cm).  . GERD (gastroesophageal reflux disease) 1990s  . History of CVA (cerebrovascular accident) 2015   by MRI - remote L internal capsule  . History of herpes genitalis    valtrex daily  . Hyperlipemia 2002  . Hypertension 2002  . Mass of submandibular region 2015   referred to gen surg after chemo  . Mild aortic stenosis  a. 07/2011 Echo: Mild AS, triv AI.  [name] Stroke ([place])   . Thrombocytopenia ([place])    outpatient goals Hgb >9, plt >20  . TIA (transient ischemic attack)    04/2016   Past Surgical History:  Procedure Laterality Date  . ANGIOPLASTY  1998  . CATARACT EXTRACTION, BILATERAL     R 1/09, L 8/09  . COLONOSCOPY  11/29/1987   Normal  . COLONOSCOPY  02/07/2003   Hemm. Internal, focal proctitis, negative biopsy  . COLONOSCOPY  12/12/2004   Internal external hemorrhoids, +proctits, negative biopsy  . CORONARY ANGIOPLASTY  1998  . CORONARY ARTERY BYPASS GRAFT   04/23/2001   x4, Dr. Pia Mau  . ESOPHAGOGASTRODUODENOSCOPY  11/29/1987   Gastritis  . ETT  12/10/2006   Persantine myoview nml  . HEMORROIDECTOMY  1954   Fissure repair, [place]  . HEPATIC ARTERY ANGIOPLASTY  1954   [place]  . KIDNEY STONE SURGERY  1977   Dr Redmond Baseman  . LITHOTRIPSY  1990s   Multiple  . US ECHOCARDIOGRAPHY  07/2011   nl sys fxn, EF 55-60%, grade 1 diast dysfunction, mild AS, mildly elevated PA pressure   Family History  Problem Relation Age of Onset  . Hypertension Mother   . Heart disease Mother   . Hyperlipidemia Mother   . Hypertension Father   . Hyperlipidemia Father   . Kidney cancer Sister        Renal cell cancer  . Alcohol abuse Brother   . Diabetes Brother   . Stroke Brother   . Heart attack Brother        MI  . Diabetes Other        Sister's daughter  . Depression Daughter        Bipolar   Social History   Socioeconomic History  . Marital status: Married    Spouse name: Not on file  . Number of children: Not on file  . Years of education: Not on file  . Highest education level: Not on file  Occupational History  . Occupation: Retired Information systems manager: RETIRED  Social Needs  . Financial resource strain: Not hard at all  . Food insecurity    Worry: Never true    Inability: Never true  . Transportation needs    Medical: No    Non-medical: No  Tobacco Use  . Smoking status: Former Smoker    Packs/day: 2.00    Years: 30.00    Pack years: 60.00    Quit date: 02/28/1979    Years since quitting: 39.6  . Smokeless tobacco: Never Used  . Tobacco comment: quit 1980  Substance and Sexual Activity  . Alcohol use: No    Alcohol/week: 0.0 standard drinks    Comment: occasional wine  . Drug use: Never  . Sexual activity: Yes  Lifestyle  . Physical activity    Days per week: 5 days    Minutes per session: 30 min  . Stress: Not at all  Relationships  . Social Herbalist on phone: Not on file    Gets together: Not  on file    Attends religious service: Not on file    Active member of club or organization: Not on file    Attends meetings of clubs or organizations: Not on file    Relationship status: Not on file  Other Topics Concern  . Not on file  Social History Narrative   Married and lives with wife  1 son died of lung cancer at 64 years old   15 daughter bipolar, lives    Activity: walks 1 mi daily on treadmill    Diet: good water, some fruits/vegetables     Outpatient Encounter Medications as of 10/30/2018  Medication Sig  . acetaminophen (TYLENOL) 325 MG tablet Take 2 tablets (650 mg total) by mouth every 4 (four) hours as needed for mild pain (or temp > 37.5 C (99.5 F)).  [name] allopurinol (ZYLOPRIM) 300 MG tablet Take 1 tablet (300 mg total) by mouth daily.  [name] aspirin EC 325 MG EC tablet Take 1 tablet (325 mg total) by mouth daily.  . cholecalciferol (VITAMIN D) 1000 units tablet Take 2,000 Units by mouth daily.  . diphenhydrAMINE (BENADRYL ALLERGY) 25 MG tablet Take 1 tablet (25 mg total) by mouth at bedtime as needed for sleep.  [name] lisinopril (PRINIVIL,ZESTRIL) 5 MG tablet Take 1 tablet (5 mg total) by mouth daily.  . nitroGLYCERIN (NITROSTAT) 0.4 MG SL tablet Place 1 tablet (0.4 mg total) under the tongue every 5 (five) minutes as needed for chest pain (max 3 doses in 15 minutes).  . polyethylene glycol powder (GLYCOLAX/MIRALAX) powder Take 17 g by mouth daily.  . simvastatin (ZOCOR) 10 MG tablet TAKE 1 TABLET AT BEDTIME (Patient taking differently: Take 10 mg by mouth daily. )  . valACYclovir (VALTREX) 500 MG tablet Take 1 tablet (500 mg total) by mouth daily.  . vitamin B-12 (CYANOCOBALAMIN) 1000 MCG tablet Take 1 tablet (1,000 mcg total) by mouth daily.  [name] dronabinol (MARINOL) 2.5 MG capsule Take 1 capsule (2.5 mg total) by mouth at bedtime. (Patient not taking: Reported on 10/30/2018)   No facility-administered encounter medications on file as of 10/30/2018.     Activities of Daily  Living In your present state of health, do you have any difficulty performing the following activities: 10/30/2018 08/28/2018  Hearing? Y N  Comment wears hearing aides -  Vision? Y N  Comment slightly -  Difficulty concentrating or making decisions? Y N  Comment short term memory loss -  Walking or climbing stairs? Y Y  Comment uses walker -  Dressing or bathing? N N  Doing errands, shopping? Tempie Donning  Comment has someone with him -  Conservation officer, nature and eating ? Y -  Comment has assistance -  Using the Toilet? N -  In the past six months, have you accidently leaked urine? N -  Do you have problems with loss of bowel control? Y -  Comment had to accidents -  Managing your Medications? Y -  Comment wife assists -  Managing your Finances? N -  Housekeeping or managing your Housekeeping? Y -  Comment someone else does -  Some recent data might be hidden    Patient Care Team: Ria Bush, MD as PCP - General (Family Medicine) Rockey Situ, Kathlene November, MD as Consulting Physician (Cardiology) Ralene Bathe, MD as Consulting Physician (Dermatology) Luci Bank, MD as Consulting Physician (Oncology) Garvin Fila, MD as Consulting Physician (Neurology)   Assessment:   This is a routine wellness examination for Jimmy [name].  Exercise Activities and Dietary recommendations Current Exercise Habits: Home exercise routine, Time (Minutes): 30, Frequency (Times/Week): 5, Weekly Exercise (Minutes/Week): 150  Goals    . Increase physical activity     Starting 10/11/2016, I will continue to exercise for at least 30 min 6-7 days per week.     . Patient Stated     Starting 10/21/2017, I  will continue to take medications as prescribed.     . Patient Stated     10/30/2018, wants to get back on his feet       Fall Risk Fall Risk  10/30/2018 09/24/2018 08/13/2018 06/17/2018 06/10/2018  Falls in the past year? 1 0 1 1 1   Comment - - - - -  Number falls in past yr: 1 - 0 1 1  Comment has had 7  falls - - - 5 falls with walker  Injury with Fall? 0 - 0 - 0  Comment slipped out of bed on to the floor - - - -  Risk for fall due to : Impaired balance/gait;Impaired mobility;Medication side effect - Impaired balance/gait;Impaired mobility - -  Follow up Falls evaluation completed;Falls prevention discussed - Falls prevention discussed - -   Is the patient's home free of loose throw rugs in walkways, pet beds, electrical cords, etc?   yes      Grab bars in the bathroom? yes      Handrails on the stairs?   N/a      Adequate lighting?   yes  Timed Get Up and Go Performed: n/a  Depression Screen PHQ 2/9 Scores 10/30/2018 06/17/2018 05/29/2018 10/21/2017  PHQ - 2 Score 0 0 0 0  PHQ- 9 Score 3 - - 0    Cognitive Function MMSE - Mini Mental State Exam 10/30/2018 10/21/2017 10/11/2016 09/20/2015  Not completed: Refused - - -  Orientation to time - 5 5 5   Orientation to Place - 5 5 5   Registration - 3 3 3   Attention/ Calculation - 0 0 0  Recall - 3 2 3   Language- name 2 objects - 0 0 0  Language- repeat - 1 1 1   Language- follow 3 step command - 3 3 3   Language- read & follow direction - 0 0 0  Write a sentence - 0 0 0  Copy design - 0 0 0  Total score - 20 19 20    Mini Cog  Mini-Cog screen was completed. Maximum score is 22. A value of 0 denotes this part of the MMSE was not completed or the patient failed this part of the Mini-Cog screening.       Immunization History  Administered Date(s) Administered  . Influenza Split 03/01/2011  . Influenza Whole 01/09/2004, 01/16/2007, 12/21/2007, 01/04/2010  . Influenza,inj,Quad PF,6+ Mos 01/07/2013, 12/29/2013, 01/02/2015, 01/01/2016, 12/31/2016, 01/15/2018  . Pneumococcal Conjugate-13 08/30/2013  . Pneumococcal Polysaccharide-23 04/01/1997  . Td 04/02/1995, 12/04/2005  . Zoster 09/19/2006    Qualifies for Shingles Vaccine? yes  Screening Tests Health Maintenance  Topic Date Due  . DTaP/Tdap/Td (1 - Tdap) 03/07/1948  .  TETANUS/TDAP  12/05/2015  . INFLUENZA VACCINE  10/31/2018  . PNA vac Low Risk Adult  Completed   Cancer Screenings: Lung: Low Dose CT Chest recommended if Age 43-80 years, 30 pack-year currently smoking OR have quit w/in 15years. Patient does not qualify. Colorectal: not required  Additional Screenings:  Hepatitis C Screening:      Plan:   Mini-cog refused. States had one not too long ago. Goal to get back on his feet.   I have personally reviewed and noted the following in the patient's chart:   . Medical and social history . Use of alcohol, tobacco or illicit drugs  . Current medications and supplements . Functional ability and status . Nutritional status . Physical activity . Advanced directives . List of other physicians . Hospitalizations, surgeries, and ER  visits in previous 12 months . Vitals . Screenings to include cognitive, depression, and falls . Referrals and appointments  In addition, I have reviewed and discussed with patient certain preventive protocols, quality metrics, and best practice recommendations. A written personalized care plan for preventive services as well as general preventive health recommendations were provided to patient.     Kellie Simmering, LPN  [birth year]01

## 2018-10-30 NOTE — Patient Instructions (Signed)
[name] , Thank you for taking time to come for your Medicare Wellness Visit. I appreciate your ongoing commitment to your health goals. Please review the following plan we discussed and let me know if I can assist you in the future.   Screening recommendations/referrals: Colonoscopy: not required Recommended yearly ophthalmology/optometry visit for glaucoma screening and checkup Recommended yearly dental visit for hygiene and checkup  Vaccinations: Influenza vaccine: 12/2017 Pneumococcal vaccine: 08/2013 Tdap vaccine: due Shingles vaccine: discussed    Advanced directives: copy in chart  Conditions/risks identified: HTN  Next appointment: 11/04/2018 at 10:30  Preventive Care 83 Years and Older, Male Preventive care refers to lifestyle choices and visits with your health care provider that can promote health and wellness. What does preventive care include?  A yearly physical exam. This is also called an annual well check.  Dental exams once or twice a year.  Routine eye exams. Ask your health care provider how often you should have your eyes checked.  Personal lifestyle choices, including:  Daily care of your teeth and gums.  Regular physical activity.  Eating a healthy diet.  Avoiding tobacco and drug use.  Limiting alcohol use.  Practicing safe sex.  Taking low doses of aspirin every day.  Taking vitamin and mineral supplements as recommended by your health care provider. What happens during an annual well check? The services and screenings done by your health care provider during your annual well check will depend on your age, overall health, lifestyle risk factors, and family history of disease. Counseling  Your health care provider may ask you questions about your:  Alcohol use.  Tobacco use.  Drug use.  Emotional well-being.  Home and relationship well-being.  Sexual activity.  Eating habits.  History of falls.  Memory and ability to  understand (cognition).  Work and work Statistician. Screening  You may have the following tests or measurements:  Height, weight, and BMI.  Blood pressure.  Lipid and cholesterol levels. These may be checked every 5 years, or more frequently if you are over 83 years old.  Skin check.  Lung cancer screening. You may have this screening every year starting at age 83 if you have a 30-pack-year history of smoking and currently smoke or have quit within the past 15 years.  Fecal occult blood test (FOBT) of the stool. You may have this test every year starting at age 83.  Flexible sigmoidoscopy or colonoscopy. You may have a sigmoidoscopy every 5 years or a colonoscopy every 10 years starting at age 83.  Prostate cancer screening. Recommendations will vary depending on your family history and other risks.  Hepatitis C blood test.  Hepatitis B blood test.  Sexually transmitted disease (STD) testing.  Diabetes screening. This is done by checking your blood sugar (glucose) after you have not eaten for a while (fasting). You may have this done every 1-3 years.  Abdominal aortic aneurysm (AAA) screening. You may need this if you are a current or former smoker.  Osteoporosis. You may be screened starting at age 83 if you are at high risk. Talk with your health care provider about your test results, treatment options, and if necessary, the need for more tests. Vaccines  Your health care provider may recommend certain vaccines, such as:  Influenza vaccine. This is recommended every year.  Tetanus, diphtheria, and acellular pertussis (Tdap, Td) vaccine. You may need a Td booster every 10 years.  Zoster vaccine. You may need this after age 38.  Pneumococcal 13-valent  conjugate (PCV13) vaccine. One dose is recommended after age 57.  Pneumococcal polysaccharide (PPSV23) vaccine. One dose is recommended after age 36. Talk to your health care provider about which screenings and vaccines you  need and how often you need them. This information is not intended to replace advice given to you by your health care provider. Make sure you discuss any questions you have with your health care provider. Document Released: 04/14/2015 Document Revised: 12/06/2015 Document Reviewed: 01/17/2015 Elsevier Interactive Patient Education  2017 [employer] [facility] Prevention in the Home Falls can cause injuries. They can happen to people of all ages. There are many things you can do to make your home safe and to help prevent falls. What can I do on the outside of my home?  Regularly fix the edges of walkways and driveways and fix any cracks.  Remove anything that might make you trip as you walk through a door, such as a raised step or threshold.  Trim any bushes or trees on the path to your home.  Use bright outdoor lighting.  Clear any walking paths of anything that might make someone trip, such as rocks or tools.  Regularly check to see if handrails are loose or broken. Make sure that both sides of any steps have handrails.  Any raised decks and porches should have guardrails on the edges.  Have any leaves, snow, or ice cleared regularly.  Use sand or salt on walking paths during winter.  Clean up any spills in your garage right away. This includes oil or grease spills. What can I do in the bathroom?  Use night lights.  Install grab bars by the toilet and in the tub and shower. Do not use towel bars as grab bars.  Use non-skid mats or decals in the tub or shower.  If you need to sit down in the shower, use a plastic, non-slip stool.  Keep the floor dry. Clean up any water that spills on the floor as soon as it happens.  Remove soap buildup in the tub or shower regularly.  Attach bath mats securely with double-sided non-slip rug tape.  Do not have throw rugs and other things on the floor that can make you trip. What can I do in the bedroom?  Use night lights.  Make sure  that you have a light by your bed that is easy to reach.  Do not use any sheets or blankets that are too big for your bed. They should not hang down onto the floor.  Have a firm chair that has side arms. You can use this for support while you get dressed.  Do not have throw rugs and other things on the floor that can make you trip. What can I do in the kitchen?  Clean up any spills right away.  Avoid walking on wet floors.  Keep items that you use a lot in easy-to-reach places.  If you need to reach something above you, use a strong step stool that has a grab bar.  Keep electrical cords out of the way.  Do not use floor polish or wax that makes floors slippery. If you must use wax, use non-skid floor wax.  Do not have throw rugs and other things on the floor that can make you trip. What can I do with my stairs?  Do not leave any items on the stairs.  Make sure that there are handrails on both sides of the stairs and use them. Fix handrails  that are broken or loose. Make sure that handrails are as long as the stairways.  Check any carpeting to make sure that it is firmly attached to the stairs. Fix any carpet that is loose or worn.  Avoid having throw rugs at the top or bottom of the stairs. If you do have throw rugs, attach them to the floor with carpet tape.  Make sure that you have a light switch at the top of the stairs and the bottom of the stairs. If you do not have them, ask someone to add them for you. What else can I do to help prevent falls?  Wear shoes that:  Do not have high heels.  Have rubber bottoms.  Are comfortable and fit you well.  Are closed at the toe. Do not wear sandals.  If you use a stepladder:  Make sure that it is fully opened. Do not climb a closed stepladder.  Make sure that both sides of the stepladder are locked into place.  Ask someone to hold it for you, if possible.  Clearly mark and make sure that you can see:  Any grab bars or  handrails.  First and last steps.  Where the edge of each step is.  Use tools that help you move around (mobility aids) if they are needed. These include:  Canes.  Walkers.  Scooters.  Crutches.  Turn on the lights when you go into a dark area. Replace any light bulbs as soon as they burn out.  Set up your furniture so you have a clear path. Avoid moving your furniture around.  If any of your floors are uneven, fix them.  If there are any pets around you, be aware of where they are.  Review your medicines with your doctor. Some medicines can make you feel dizzy. This can increase your chance of falling. Ask your doctor what other things that you can do to help prevent falls. This information is not intended to replace advice given to you by your health care provider. Make sure you discuss any questions you have with your health care provider. Document Released: 01/12/2009 Document Revised: 08/24/2015 Document Reviewed: 04/22/2014 Elsevier Interactive Patient Education  2017 [employer].

## 2018-10-30 NOTE — Progress Notes (Signed)
PCP notes:  Health Maintenance:  Tetanus is due.  Abnormal Screenings:  Had 7 falls. Mostly slipping out of the bed  Patient concerns:  None  Nurse concerns:  Patient has had a lot of falls.  Next PCP appt.: 11/04/2018 at 10:30

## 2018-10-30 NOTE — Telephone Encounter (Signed)
Agree with this. Thanks.  

## 2018-10-30 NOTE — Telephone Encounter (Signed)
Almyra Free from [facility] called for verbal orders to extend speech therapy 1 x weekly for 4 weeks. Please call verbal orders to [phone]

## 2018-10-30 NOTE — Telephone Encounter (Signed)
I have not called him yet. Not due to call until 2pm

## 2018-11-02 DIAGNOSIS — I69391 Dysphagia following cerebral infarction: Secondary | ICD-10-CM | POA: Diagnosis not present

## 2018-11-02 DIAGNOSIS — I69328 Other speech and language deficits following cerebral infarction: Secondary | ICD-10-CM | POA: Diagnosis not present

## 2018-11-02 DIAGNOSIS — I2510 Atherosclerotic heart disease of native coronary artery without angina pectoris: Secondary | ICD-10-CM | POA: Diagnosis not present

## 2018-11-02 DIAGNOSIS — I69354 Hemiplegia and hemiparesis following cerebral infarction affecting left non-dominant side: Secondary | ICD-10-CM | POA: Diagnosis not present

## 2018-11-02 DIAGNOSIS — I130 Hypertensive heart and chronic kidney disease with heart failure and stage 1 through stage 4 chronic kidney disease, or unspecified chronic kidney disease: Secondary | ICD-10-CM | POA: Diagnosis not present

## 2018-11-02 DIAGNOSIS — R1310 Dysphagia, unspecified: Secondary | ICD-10-CM | POA: Diagnosis not present

## 2018-11-02 NOTE — Telephone Encounter (Signed)
Spoke with Jimmy [name] of [facility] informing her Dr. Darnell Level is giving verbal orders for therapy requested.  Verbalizes understanding.

## 2018-11-03 DIAGNOSIS — I69354 Hemiplegia and hemiparesis following cerebral infarction affecting left non-dominant side: Secondary | ICD-10-CM | POA: Diagnosis not present

## 2018-11-03 DIAGNOSIS — R1310 Dysphagia, unspecified: Secondary | ICD-10-CM | POA: Diagnosis not present

## 2018-11-03 DIAGNOSIS — I2510 Atherosclerotic heart disease of native coronary artery without angina pectoris: Secondary | ICD-10-CM | POA: Diagnosis not present

## 2018-11-03 DIAGNOSIS — I69391 Dysphagia following cerebral infarction: Secondary | ICD-10-CM | POA: Diagnosis not present

## 2018-11-03 DIAGNOSIS — I130 Hypertensive heart and chronic kidney disease with heart failure and stage 1 through stage 4 chronic kidney disease, or unspecified chronic kidney disease: Secondary | ICD-10-CM | POA: Diagnosis not present

## 2018-11-03 DIAGNOSIS — I69328 Other speech and language deficits following cerebral infarction: Secondary | ICD-10-CM | POA: Diagnosis not present

## 2018-11-04 ENCOUNTER — Ambulatory Visit (INDEPENDENT_AMBULATORY_CARE_PROVIDER_SITE_OTHER): Payer: Medicare Other | Admitting: Family Medicine

## 2018-11-04 ENCOUNTER — Encounter: Payer: Self-pay | Admitting: Family Medicine

## 2018-11-04 ENCOUNTER — Other Ambulatory Visit: Payer: Self-pay

## 2018-11-04 VITALS — BP 112/60 | HR 92 | Temp 98.3°F | Ht 69.5 in | Wt 155.2 lb

## 2018-11-04 DIAGNOSIS — C9110 Chronic lymphocytic leukemia of B-cell type not having achieved remission: Secondary | ICD-10-CM

## 2018-11-04 DIAGNOSIS — I6930 Unspecified sequelae of cerebral infarction: Secondary | ICD-10-CM

## 2018-11-04 DIAGNOSIS — E782 Mixed hyperlipidemia: Secondary | ICD-10-CM

## 2018-11-04 DIAGNOSIS — N183 Chronic kidney disease, stage 3 unspecified: Secondary | ICD-10-CM

## 2018-11-04 DIAGNOSIS — G3184 Mild cognitive impairment, so stated: Secondary | ICD-10-CM

## 2018-11-04 DIAGNOSIS — I69391 Dysphagia following cerebral infarction: Secondary | ICD-10-CM

## 2018-11-04 DIAGNOSIS — I10 Essential (primary) hypertension: Secondary | ICD-10-CM

## 2018-11-04 DIAGNOSIS — I6521 Occlusion and stenosis of right carotid artery: Secondary | ICD-10-CM

## 2018-11-04 DIAGNOSIS — E559 Vitamin D deficiency, unspecified: Secondary | ICD-10-CM

## 2018-11-04 DIAGNOSIS — Z951 Presence of aortocoronary bypass graft: Secondary | ICD-10-CM

## 2018-11-04 NOTE — Assessment & Plan Note (Signed)
Reviewed with patient. Encouraged good hydration status and avoiding NSAIDs.

## 2018-11-04 NOTE — Assessment & Plan Note (Signed)
Stable period.  

## 2018-11-04 NOTE — Patient Instructions (Signed)
You are doing well today Continue working with home health.  Consider carotid ultrasound in November or December.  Return as needed or in 6 months for follow up visit.

## 2018-11-04 NOTE — Assessment & Plan Note (Addendum)
Notes ongoing trouble with rough foods. No trouble with liquids or pills (which he takes with apple sauce).

## 2018-11-04 NOTE — Assessment & Plan Note (Signed)
Well controlled on low dose simvastatin.  The ASCVD Risk score Mikey Bussing DC Jr., et al., 2013) failed to calculate for the following reasons:   The 2013 ASCVD risk score is only valid for ages 13 to 8   The patient has a prior MI or stroke diagnosis

## 2018-11-04 NOTE — Assessment & Plan Note (Signed)
CBC stable. Upcoming [facility] appt with labs later this month.

## 2018-11-04 NOTE — Progress Notes (Signed)
This visit was conducted in person.  BP 112/60 (BP Location: Right Arm, Patient Position: Sitting, Cuff Size: Normal)   Pulse 92   Temp 98.3 F (36.8 C) (Temporal)   Ht 5' 9.5" (1.765 m)   Wt 155 lb 3 oz (70.4 kg)   SpO2 96%   BMI 22.59 kg/m    CC: AMW f/u visit Subjective:    [name], male    DOB: [birth year], 85 y.o.   MRN: [id number]  HPI: Jimmy [name] is a 85 y.o. male presenting on 11/04/2018 for Annual Exam (Pt 2.  Pt accompanied by wife, [name]. )   Saw health advisor last week for medicare wellness visit. Note reviewed.  Multiple falls noted after 04/2018 stroke. No falls in the past month.   CLL stage 0 - followed by [facility]. Stable period.  Known CKD, CAD s/p 4v CABG 2001, HLD and HTN, prior TIA initially on plavix, changed to aggrenox after TIA recurrence, unfortunately has had recurrent strokes (04/2018 hemorrhagic, 07/2018 ischemic) with residual L sided hemiparesis dysphagia and dysarthria. Currently on full dose aspirin. About to complete [facility] therapies. Awaiting L arm brace.   Uses wheelchair for stability, then when he gets tired sits down.   Preventative: Aged out of screens Immunizations UTD Shingrix - discussed, declines.  Advanced directive planning: Form reviewed and scanned into chart - brings living will desiring no extraordinary life prolonging measures (08/2014). Would want wife [name] to be HCPOA.  Seat belt use discussed.  Sunscreen use discussed (never outside), no changing moles on skin. Sees dermatologist tomorrow.  Ex smoker - quit 1980 Alcohol - none Dentist - Q6 mo Eye exam - yearly  Bowel - no constipation or diarrhea Bladder - no incontinence  Married and lives with wife 1 son died of lung cancer at 18 years old 29 daughter bipolar, lives  Activity: stationary bicycle 20 min/day  Diet: good water, some fruits/vegetables      Relevant past medical, surgical, family and social history reviewed and updated as indicated.  Interim medical history since our last visit reviewed. Allergies and medications reviewed and updated. Outpatient Medications Prior to Visit  Medication Sig Dispense Refill  . acetaminophen (TYLENOL) 325 MG tablet Take 2 tablets (650 mg total) by mouth every 4 (four) hours as needed for mild pain (or temp > 37.5 C (99.5 F)).    [name] allopurinol (ZYLOPRIM) 300 MG tablet Take 1 tablet (300 mg total) by mouth daily. 90 tablet 1  . aspirin EC 325 MG EC tablet Take 1 tablet (325 mg total) by mouth daily. 180 tablet 0  . cholecalciferol (VITAMIN D) 1000 units tablet Take 2,000 Units by mouth daily.    . diphenhydrAMINE (BENADRYL ALLERGY) 25 MG tablet Take 1 tablet (25 mg total) by mouth at bedtime as needed for sleep.    [name] dronabinol (MARINOL) 2.5 MG capsule Take 1 capsule (2.5 mg total) by mouth at bedtime. 30 capsule 1  . lisinopril (PRINIVIL,ZESTRIL) 5 MG tablet Take 1 tablet (5 mg total) by mouth daily.    . nitroGLYCERIN (NITROSTAT) 0.4 MG SL tablet Place 1 tablet (0.4 mg total) under the tongue every 5 (five) minutes as needed for chest pain (max 3 doses in 15 minutes). 25 tablet 3  . polyethylene glycol powder (GLYCOLAX/MIRALAX) powder Take 17 g by mouth daily.    . simvastatin (ZOCOR) 10 MG tablet TAKE 1 TABLET AT BEDTIME (Patient taking differently: Take 10 mg by mouth daily. ) 90 tablet  3  . valACYclovir (VALTREX) 500 MG tablet Take 1 tablet (500 mg total) by mouth daily. 90 tablet 1  . vitamin B-12 (CYANOCOBALAMIN) 1000 MCG tablet Take 1 tablet (1,000 mcg total) by mouth daily.     No facility-administered medications prior to visit.      Per HPI unless specifically indicated in ROS section below Review of Systems Objective:    BP 112/60 (BP Location: Right Arm, Patient Position: Sitting, Cuff Size: Normal)   Pulse 92   Temp 98.3 F (36.8 C) (Temporal)   Ht 5' 9.5" (1.765 m)   Wt 155 lb 3 oz (70.4 kg)   SpO2 96%   BMI 22.59 kg/m   Wt Readings from Last 3 Encounters:  11/04/18 155  lb 3 oz (70.4 kg)  10/30/18 155 lb (70.3 kg)  09/09/18 150 lb (68 kg)    Physical Exam Vitals signs and nursing note reviewed.  Constitutional:      General: He is not in acute distress.    Appearance: He is well-developed.  HENT:     Head: Normocephalic and atraumatic.     Right Ear: Hearing and external ear normal.     Left Ear: Hearing and external ear normal.     Nose: Nose normal.     Mouth/Throat:     Mouth: Mucous membranes are moist.     Pharynx: Uvula midline. No oropharyngeal exudate or posterior oropharyngeal erythema.  Eyes:     General: No scleral icterus.    Extraocular Movements: Extraocular movements intact.     Conjunctiva/sclera: Conjunctivae normal.     Pupils: Pupils are equal, round, and reactive to light.  Neck:     Musculoskeletal: Normal range of motion and neck supple.     Vascular: Carotid bruit (referred from murmur) present.  Cardiovascular:     Rate and Rhythm: Normal rate and regular rhythm.     Pulses: Normal pulses.          Radial pulses are 2+ on the right side and 2+ on the left side.     Heart sounds: Normal heart sounds. No murmur (4/6 systolic throughout).  Pulmonary:     Effort: Pulmonary effort is normal. No respiratory distress.     Breath sounds: Normal breath sounds. No wheezing, rhonchi or rales.  Abdominal:     General: Abdomen is flat. Bowel sounds are normal. There is no distension.     Palpations: Abdomen is soft. There is no mass.     Tenderness: There is no abdominal tenderness. There is no guarding or rebound.     Hernia: No hernia is present.  Musculoskeletal: Normal range of motion.     Right lower leg: No edema.     Left lower leg: No edema.  Lymphadenopathy:     Cervical: No cervical adenopathy.  Skin:    General: Skin is warm and dry.     Findings: No rash.  Neurological:     Mental Status: He is alert. Mental status is at baseline.     Comments: In wheelchair.   Psychiatric:        Mood and Affect: Mood normal.         Behavior: Behavior normal.        Thought Content: Thought content normal.        Judgment: Judgment normal.       Results for orders placed or performed in visit on 10/27/18  TSH  Result Value Ref Range   TSH 4.33 0.35 -  4.50 uIU/mL  Uric acid  Result Value Ref Range   Uric Acid, Serum 4.5 4.0 - 7.8 mg/dL  VITAMIN D 25 Hydroxy (Vit-D Deficiency, Fractures)  Result Value Ref Range   VITD 65.82 30.00 - 100.00 ng/mL  Comprehensive metabolic panel  Result Value Ref Range   Sodium 141 135 - 145 mEq/L   Potassium 4.1 3.5 - 5.1 mEq/L   Chloride 103 96 - 112 mEq/L   CO2 31 19 - 32 mEq/L   Glucose, Bld 142 (H) 70 - 99 mg/dL   BUN 22 6 - 23 mg/dL   Creatinine, Ser 1.25 0.40 - 1.50 mg/dL   Total Bilirubin 0.6 0.2 - 1.2 mg/dL   Alkaline Phosphatase 119 (H) 39 - 117 U/L   AST 13 0 - 37 U/L   ALT 13 0 - 53 U/L   Total Protein 6.3 6.0 - 8.3 g/dL   Albumin 4.1 3.5 - 5.2 g/dL   Calcium 9.9 8.4 - 10.5 mg/dL   GFR 54.56 (L) >60.00 mL/min   Lab Results  Component Value Date   WBC 8.7 09/09/2018   HGB 15.7 09/09/2018   HCT 47.1 09/09/2018   MCV 93.6 09/09/2018   PLT 176 09/09/2018   Lab Results  Component Value Date   CHOL 118 08/28/2018   HDL 35 (L) 08/28/2018   LDLCALC 56 08/28/2018   TRIG 135 08/28/2018   CHOLHDL 3.4 08/28/2018    Lab Results  Component Value Date   HGBA1C 5.6 08/28/2018    Assessment & Plan:   Problem List Items Addressed This Visit      Chronic   Chronic lymphocytic leukemia, Rai stage 0 (HCC)    CBC stable. Upcoming [facility] appt with labs later this month.        Other   Vitamin D deficiency    Levels well controlled on 2000 IU daily.       S/P CABG (coronary artery bypass graft)   Mixed hyperlipidemia    Well controlled on low dose simvastatin.  The ASCVD Risk score Mikey Bussing DC Jr., et al., 2013) failed to calculate for the following reasons:   The 2013 ASCVD risk score is only valid for ages 50 to 70   The patient has a prior MI  or stroke diagnosis       MCI (mild cognitive impairment) with memory loss    Stable period.       History of stroke with residual deficit - Primary    H/o TIAs, remote lacunar infacts, h/o hemorrhagic stroke 04/2018 and ischemic stroke 07/2018. Difficult situation. Currently on full dose aspirin. Continue this and statin. Continues HH PT/OT/ST.       Essential hypertension    Chronic, stable on low dose lisinopril.       Dysphagia, post-stroke    Notes ongoing trouble with rough foods. No trouble with liquids or pills (which he takes with apple sauce).       Chronic kidney disease, stage 3 ([place])    Reviewed with patient. Encouraged good hydration status and avoiding NSAIDs.       Carotid artery stenosis    Reviewed with patient. Consider rpt 01/2019 given progression of stenosis and recent CVAs.           No orders of the defined types were placed in this encounter.  No orders of the defined types were placed in this encounter.   Patient Instructions  You are doing well today Continue working with home health.  Consider carotid ultrasound in November or December.  Return as needed or in 6 months for follow up visit.    Follow up plan: Return in about 6 months (around 05/07/2019) for follow up visit.  Ria Bush, MD

## 2018-11-04 NOTE — Assessment & Plan Note (Addendum)
H/o TIAs, remote lacunar infacts, h/o hemorrhagic stroke 04/2018 and ischemic stroke 07/2018. Difficult situation. Currently on full dose aspirin. Continue this and statin. Continues HH PT/OT/ST.

## 2018-11-04 NOTE — Assessment & Plan Note (Signed)
Reviewed with patient. Consider rpt 01/2019 given progression of stenosis and recent CVAs.

## 2018-11-04 NOTE — Assessment & Plan Note (Signed)
Chronic, stable on low dose lisinopril.

## 2018-11-04 NOTE — Assessment & Plan Note (Signed)
Levels well controlled on 2000 IU daily.

## 2018-11-05 DIAGNOSIS — I130 Hypertensive heart and chronic kidney disease with heart failure and stage 1 through stage 4 chronic kidney disease, or unspecified chronic kidney disease: Secondary | ICD-10-CM | POA: Diagnosis not present

## 2018-11-05 DIAGNOSIS — I69328 Other speech and language deficits following cerebral infarction: Secondary | ICD-10-CM | POA: Diagnosis not present

## 2018-11-05 DIAGNOSIS — I2510 Atherosclerotic heart disease of native coronary artery without angina pectoris: Secondary | ICD-10-CM | POA: Diagnosis not present

## 2018-11-05 DIAGNOSIS — I69354 Hemiplegia and hemiparesis following cerebral infarction affecting left non-dominant side: Secondary | ICD-10-CM | POA: Diagnosis not present

## 2018-11-05 DIAGNOSIS — I69391 Dysphagia following cerebral infarction: Secondary | ICD-10-CM | POA: Diagnosis not present

## 2018-11-05 DIAGNOSIS — R1310 Dysphagia, unspecified: Secondary | ICD-10-CM | POA: Diagnosis not present

## 2018-11-06 DIAGNOSIS — I69328 Other speech and language deficits following cerebral infarction: Secondary | ICD-10-CM | POA: Diagnosis not present

## 2018-11-06 DIAGNOSIS — I69354 Hemiplegia and hemiparesis following cerebral infarction affecting left non-dominant side: Secondary | ICD-10-CM | POA: Diagnosis not present

## 2018-11-06 DIAGNOSIS — I130 Hypertensive heart and chronic kidney disease with heart failure and stage 1 through stage 4 chronic kidney disease, or unspecified chronic kidney disease: Secondary | ICD-10-CM | POA: Diagnosis not present

## 2018-11-06 DIAGNOSIS — I69391 Dysphagia following cerebral infarction: Secondary | ICD-10-CM | POA: Diagnosis not present

## 2018-11-06 DIAGNOSIS — I2510 Atherosclerotic heart disease of native coronary artery without angina pectoris: Secondary | ICD-10-CM | POA: Diagnosis not present

## 2018-11-06 DIAGNOSIS — R1310 Dysphagia, unspecified: Secondary | ICD-10-CM | POA: Diagnosis not present

## 2018-11-10 DIAGNOSIS — I2510 Atherosclerotic heart disease of native coronary artery without angina pectoris: Secondary | ICD-10-CM | POA: Diagnosis not present

## 2018-11-10 DIAGNOSIS — R1310 Dysphagia, unspecified: Secondary | ICD-10-CM | POA: Diagnosis not present

## 2018-11-10 DIAGNOSIS — I130 Hypertensive heart and chronic kidney disease with heart failure and stage 1 through stage 4 chronic kidney disease, or unspecified chronic kidney disease: Secondary | ICD-10-CM | POA: Diagnosis not present

## 2018-11-10 DIAGNOSIS — I69354 Hemiplegia and hemiparesis following cerebral infarction affecting left non-dominant side: Secondary | ICD-10-CM | POA: Diagnosis not present

## 2018-11-10 DIAGNOSIS — I69328 Other speech and language deficits following cerebral infarction: Secondary | ICD-10-CM | POA: Diagnosis not present

## 2018-11-10 DIAGNOSIS — I69391 Dysphagia following cerebral infarction: Secondary | ICD-10-CM | POA: Diagnosis not present

## 2018-11-11 ENCOUNTER — Other Ambulatory Visit: Payer: Self-pay

## 2018-11-11 DIAGNOSIS — I69354 Hemiplegia and hemiparesis following cerebral infarction affecting left non-dominant side: Secondary | ICD-10-CM | POA: Diagnosis not present

## 2018-11-11 DIAGNOSIS — I2510 Atherosclerotic heart disease of native coronary artery without angina pectoris: Secondary | ICD-10-CM | POA: Diagnosis not present

## 2018-11-11 DIAGNOSIS — R1310 Dysphagia, unspecified: Secondary | ICD-10-CM | POA: Diagnosis not present

## 2018-11-11 DIAGNOSIS — I69391 Dysphagia following cerebral infarction: Secondary | ICD-10-CM | POA: Diagnosis not present

## 2018-11-11 DIAGNOSIS — I130 Hypertensive heart and chronic kidney disease with heart failure and stage 1 through stage 4 chronic kidney disease, or unspecified chronic kidney disease: Secondary | ICD-10-CM | POA: Diagnosis not present

## 2018-11-11 DIAGNOSIS — I69328 Other speech and language deficits following cerebral infarction: Secondary | ICD-10-CM | POA: Diagnosis not present

## 2018-11-11 NOTE — Telephone Encounter (Signed)
Filled and in Lisa's box 

## 2018-11-11 NOTE — Telephone Encounter (Signed)
Faxed form.

## 2018-11-11 NOTE — Telephone Encounter (Signed)
Received faxed Rx- Letter/Cert of Medical Necessity from [facility].  Placed rx in Dr. Synthia Innocent box.

## 2018-11-13 ENCOUNTER — Telehealth: Payer: Self-pay

## 2018-11-13 ENCOUNTER — Encounter: Payer: Medicare Other | Attending: Physical Medicine & Rehabilitation | Admitting: Physical Medicine & Rehabilitation

## 2018-11-13 ENCOUNTER — Other Ambulatory Visit: Payer: Self-pay

## 2018-11-13 ENCOUNTER — Encounter: Payer: Self-pay | Admitting: Physical Medicine & Rehabilitation

## 2018-11-13 VITALS — BP 116/71 | HR 70 | Temp 98.1°F | Ht 69.0 in | Wt 155.0 lb

## 2018-11-13 DIAGNOSIS — I69398 Other sequelae of cerebral infarction: Secondary | ICD-10-CM | POA: Insufficient documentation

## 2018-11-13 DIAGNOSIS — I6521 Occlusion and stenosis of right carotid artery: Secondary | ICD-10-CM

## 2018-11-13 DIAGNOSIS — R269 Unspecified abnormalities of gait and mobility: Secondary | ICD-10-CM | POA: Diagnosis not present

## 2018-11-13 DIAGNOSIS — I69954 Hemiplegia and hemiparesis following unspecified cerebrovascular disease affecting left non-dominant side: Secondary | ICD-10-CM

## 2018-11-13 DIAGNOSIS — I2510 Atherosclerotic heart disease of native coronary artery without angina pectoris: Secondary | ICD-10-CM | POA: Diagnosis not present

## 2018-11-13 DIAGNOSIS — I130 Hypertensive heart and chronic kidney disease with heart failure and stage 1 through stage 4 chronic kidney disease, or unspecified chronic kidney disease: Secondary | ICD-10-CM | POA: Diagnosis not present

## 2018-11-13 DIAGNOSIS — R1310 Dysphagia, unspecified: Secondary | ICD-10-CM | POA: Diagnosis not present

## 2018-11-13 DIAGNOSIS — I69354 Hemiplegia and hemiparesis following cerebral infarction affecting left non-dominant side: Secondary | ICD-10-CM | POA: Diagnosis not present

## 2018-11-13 DIAGNOSIS — I69328 Other speech and language deficits following cerebral infarction: Secondary | ICD-10-CM | POA: Diagnosis not present

## 2018-11-13 DIAGNOSIS — I69391 Dysphagia following cerebral infarction: Secondary | ICD-10-CM | POA: Diagnosis not present

## 2018-11-13 NOTE — Progress Notes (Signed)
CC: stroke f/u   HPI:   Jimmy [name] is a 85 y.o. with a hx noted below who presents for follow up of R lentiform hemorraghic stroke on 04/04/2018. He has residual LUE>RUE weakness. Currently receiving home health therapies.  His wife says that she is concerned about the next week, since she has to have cataract surgery and will not be able to physically lift anything for at least a week after.  Overall, the patient says his weakness is about the same as it has been but therapies have been going well.  Of note, the patient had a fall yesterday that resulted in abrasions on his elbow and knee.  He did not hit his head or have any other injuries.   Past Medical History:  Diagnosis Date  . CAD (coronary artery disease)    a. 04/2001 S/P CABGx 4;  b. 2008 MV:  EF 63% normal perfusion.  . Carotid stenosis    a. 11/2012 Carotid U/S:  RICA 52-84%, LICA 13-24%.  . Chronic kidney disease, stage 3 ([place])   . CLL (chronic lymphoblastic leukemia) 2009   Stage IV; Dr. Ivor Messier - referred to Dr. Lissa Merlin [facility] 07/2013 now on Rituxan (10/2013) --> stage 0 (09/2014)  . Diastolic CHF ([place])    a. 06/100 Echo: EF 55-65%, mild conc LVH, Gr 1 DD, mild AS, triv AI, mildly dil Ao root (3.5 cm).  . GERD (gastroesophageal reflux disease) 1990s  . History of CVA (cerebrovascular accident) 2015   by MRI - remote L internal capsule  . History of herpes genitalis    valtrex daily  . Hyperlipemia 2002  . Hypertension 2002  . Mass of submandibular region 2015   referred to gen surg after chemo  . Mild aortic stenosis    a. 07/2011 Echo: Mild AS, triv AI.  [name] Stroke ([place])   . Thrombocytopenia ([place])    outpatient goals Hgb >9, plt >20  . TIA (transient ischemic attack)    04/2016   Review of Systems: Systems have been reviewed and are otherwise negative unless mentioned in the HPI  Physical Exam:  Vitals:   11/13/18 1319  BP: 116/71  Pulse: 70  Temp: 98.1 F (36.7 C)  SpO2: 97%  Weight: 70.3 kg   Height: 5\' 9"  (1.753 m)   Physical Exam Vitals signs reviewed.  Constitutional:      General: He is not in acute distress.    Appearance: He is not toxic-appearing.     Comments: Thin appearing   HENT:     Head: Normocephalic and atraumatic.  Eyes:     General: No scleral icterus.       Right eye: No discharge.        Left eye: No discharge.     Conjunctiva/sclera: Conjunctivae normal.  Cardiovascular:     Comments: Warm and well-perfused Pulmonary:     Effort: Pulmonary effort is normal.  Musculoskeletal:     Comments: RUE: 5/5 strength with flexion and extension, hand grip strength 5/5  LUE: 3/5 strength with flexion and extension, and grip strength 3/5  RLE: 5/5 strength with hip,knee and ankle flexion and extension  LLE: 3/5 strength with hip flexion and extension 4/5 with knee flexion and extension 5/5 ankle flexion and extension  Skin:    Comments: Bandage over abrasions of the left knee and left elbow  Neurological:     Mental Status: He is alert.      Assessment & Plan:   In  summary, Jimmy [name] is a 85 y.o m who presents for follow up of R lentiform hemorraghic stroke on 04/04/2018. He has residual LUE>RUE weakness.  #R lentiform hemorraghic stroke #L sided weakness  -Continue home health PTand OT -Counseled patient to be careful to avoid falls in the future since him and his wife live by themselves - f/u in 4 weeks  Patient seen with Dr. Aretta Nip

## 2018-11-13 NOTE — Telephone Encounter (Signed)
Spoke with Sharee Pimple of [facility] informing her Dr. Darnell Level is giving verbal orders for services requested.

## 2018-11-13 NOTE — Telephone Encounter (Signed)
Sharee Pimple with [facility] left v/m requesting additional [place] aide visit from 2 x a wk to 3 x the wk of 11/16/18 due to pts wife having eye surgery next wk and will not be able to bathe pt that day. Sharee Pimple request cb.

## 2018-11-13 NOTE — Telephone Encounter (Signed)
Agree with this.  

## 2018-11-13 NOTE — Progress Notes (Signed)
Subjective:    [name], male    DOB: 1928-05-04, 85 y.o.   MRN: [id number]  HPI 85 year old male with history of coronary artery disease status post bypass, chronic kidney disease CLL diastolic CHF left internal capsule infarct who suffered a right lentiform hemorrhage 04/04/2018.  He has residual left upper extremity greater than left lower extremity weakness.  He is receiving home health therapy. His wife is concerned because she is having right eye cataract surgery next week and will be able to lift for a week afterwards.  She plans to get left eye cataract removed in September. He is independent with dressing he uses a walker for ambulation. Pain Inventory Average Pain 2 Pain Right Now 2 My pain is aching  In the last 24 hours, has pain interfered with the following? General activity 2 Relation with others 2 Enjoyment of life 2 What TIME of day is your pain at its worst? morning Sleep (in general) Fair  Pain is worse with: inactivity Pain improves with: rest Relief from Meds: 6  Mobility walk with assistance use a walker ability to climb steps?  no do you drive?  no use a wheelchair  Function retired I need assistance with the following:  feeding, dressing and bathing  Neuro/Psych trouble walking depression  Prior Studies Any changes since last visit?  no  Physicians involved in your care Any changes since last visit?  no   Family History  Problem Relation Age of Onset  . Hypertension Mother   . Heart disease Mother   . Hyperlipidemia Mother   . Hypertension Father   . Hyperlipidemia Father   . Kidney cancer Sister        Renal cell cancer  . Alcohol abuse Brother   . Diabetes Brother   . Stroke Brother   . Heart attack Brother        MI  . Diabetes Other        Sister's daughter  . Depression Daughter        Bipolar   Social History   Socioeconomic History  . Marital status: Married    Spouse name: Not on file  . Number  of children: Not on file  . Years of education: Not on file  . Highest education level: Not on file  Occupational History  . Occupation: Retired Information systems manager: RETIRED  Social Needs  . Financial resource strain: Not hard at all  . Food insecurity    Worry: Never true    Inability: Never true  . Transportation needs    Medical: No    Non-medical: No  Tobacco Use  . Smoking status: Former Smoker    Packs/day: 2.00    Years: 30.00    Pack years: 60.00    Quit date: 02/28/1979    Years since quitting: 39.7  . Smokeless tobacco: Never Used  . Tobacco comment: quit 1980  Substance and Sexual Activity  . Alcohol use: No    Alcohol/week: 0.0 standard drinks    Comment: occasional wine  . Drug use: Never  . Sexual activity: Yes  Lifestyle  . Physical activity    Days per week: 5 days    Minutes per session: 30 min  . Stress: Not at all  Relationships  . Social Herbalist on phone: Not on file    Gets together: Not on file    Attends religious service: Not on file    Active  member of club or organization: Not on file    Attends meetings of clubs or organizations: Not on file    Relationship status: Not on file  Other Topics Concern  . Not on file  Social History Narrative   Married and lives with wife   1 son died of lung cancer at 4 years old   74 daughter bipolar, lives    Activity: walks 1 mi daily on treadmill    Diet: good water, some fruits/vegetables    Past Surgical History:  Procedure Laterality Date  . ANGIOPLASTY  1998  . CATARACT EXTRACTION, BILATERAL     R 1/09, L 8/09  . COLONOSCOPY  11/29/1987   Normal  . COLONOSCOPY  02/07/2003   Hemm. Internal, focal proctitis, negative biopsy  . COLONOSCOPY  12/12/2004   Internal external hemorrhoids, +proctits, negative biopsy  . CORONARY ANGIOPLASTY  1998  . CORONARY ARTERY BYPASS GRAFT  04/23/2001   x4, Dr. Pia Mau  . ESOPHAGOGASTRODUODENOSCOPY  11/29/1987   Gastritis  . ETT  12/10/2006    Persantine myoview nml  . HEMORROIDECTOMY  1954   Fissure repair, [place]  . HEPATIC ARTERY ANGIOPLASTY  1954   [place], [place]  . KIDNEY STONE SURGERY  1977   Dr Redmond Baseman  . LITHOTRIPSY  1990s   Multiple  . US ECHOCARDIOGRAPHY  07/2011   nl sys fxn, EF 55-60%, grade 1 diast dysfunction, mild AS, mildly elevated PA pressure   Past Medical History:  Diagnosis Date  . CAD (coronary artery disease)    a. 04/2001 S/P CABGx 4;  b. 2008 MV:  EF 63% normal perfusion.  . Carotid stenosis    a. 11/2012 Carotid U/S:  RICA 29-51%, LICA 88-41%.  . Chronic kidney disease, stage 3 ([place])   . CLL (chronic lymphoblastic leukemia) 2009   Stage IV; Dr. Ivor Messier - referred to Dr. Lissa Merlin [facility] 07/2013 now on Rituxan (10/2013) --> stage 0 (09/2014)  . Diastolic CHF ([place])    a. 08/6061 Echo: EF 55-65%, mild conc LVH, Gr 1 DD, mild AS, triv AI, mildly dil Ao root (3.5 cm).  . GERD (gastroesophageal reflux disease) 1990s  . History of CVA (cerebrovascular accident) 2015   by MRI - remote L internal capsule  . History of herpes genitalis    valtrex daily  . Hyperlipemia 2002  . Hypertension 2002  . Mass of submandibular region 2015   referred to gen surg after chemo  . Mild aortic stenosis    a. 07/2011 Echo: Mild AS, triv AI.  [name] Stroke ([place])   . Thrombocytopenia ([place])    outpatient goals Hgb >9, plt >20  . TIA (transient ischemic attack)    04/2016   BP 116/71   Pulse 70   Temp 98.1 F (36.7 C)   Ht 5\' 9"  (1.753 m)   Wt 155 lb (70.3 kg)   SpO2 97%   BMI 22.89 kg/m   Opioid Risk Score:   Fall Risk Score:  `1  Depression screen PHQ 2/9  Depression screen [facility] 2/9 10/30/2018 06/17/2018 05/29/2018 10/21/2017 10/11/2016 09/20/2015 09/16/2014  Decreased Interest 0 0 0 0 0 0 0  Down, Depressed, Hopeless 0 0 0 0 0 0 0  PHQ - 2 Score 0 0 0 0 0 0 0  Altered sleeping 0 - - 0 - - -  Tired, decreased energy 0 - - 0 - - -  Change in appetite 0 - - 0 - - -  Feeling bad or failure  about yourself  0 - - 0 - - -   Trouble concentrating 3 - - 0 - - -  Moving slowly or fidgety/restless 0 - - 0 - - -  Suicidal thoughts 0 - - 0 - - -  PHQ-9 Score 3 - - 0 - - -  Difficult doing work/chores Not difficult at all - - Not difficult at all - - -  Some recent data might be hidden     Review of Systems  Constitutional: Negative.   HENT: Negative.   Eyes: Negative.   Respiratory: Positive for cough.   Cardiovascular: Negative.   Gastrointestinal: Positive for constipation.  Endocrine: Negative.   Genitourinary: Negative.   Musculoskeletal: Positive for gait problem.  Skin: Negative.   Allergic/Immunologic: Negative.   Hematological: Negative.   Psychiatric/Behavioral: Positive for dysphoric mood.  All other systems reviewed and are negative.      Objective:   Physical Exam Vitals signs and nursing note reviewed.  Constitutional:      Appearance: Normal appearance.  Eyes:     Extraocular Movements: Extraocular movements intact.     Pupils: Pupils are equal, round, and reactive to light.  Skin:    General: Skin is warm and dry.  Neurological:     General: No focal deficit present.     Mental Status: He is alert and oriented to person, place, and time.     Comments: Motor strength is 5/5 in the right deltoid bicep tricep grip hip flexor knee extensor ankle dorsiflexor Sensation intact to light touch bilateral upper and lower limbs Left upper tremor 3- at the deltoid biceps triceps finger flexors and extensors wrist flexors and extensors Left lower extremity 4/5 in left hip flexor 5/5 in the knee extensor and ankle dorsiflexor.  Unable to perform cerebellar testing secondary to weakness in left upper extremity. Gait using walker wide-based support no evidence of toe drag or knee instability  Psychiatric:        Mood and Affect: Mood normal.        Behavior: Behavior normal.           Assessment & Plan:  1.  Right lentiform nucleus hemorrhage with residual left upper extremity right  lower extremity weakness.  Overall he is doing well with his recovery.  He is modified independent with his walker on level surfaces household distances Continue home health therapy return to clinic in 1 month  We discussed time course of recovery and that his improvements should be leveling off at this point

## 2018-11-16 DIAGNOSIS — I2510 Atherosclerotic heart disease of native coronary artery without angina pectoris: Secondary | ICD-10-CM | POA: Diagnosis not present

## 2018-11-16 DIAGNOSIS — I69391 Dysphagia following cerebral infarction: Secondary | ICD-10-CM | POA: Diagnosis not present

## 2018-11-16 DIAGNOSIS — I130 Hypertensive heart and chronic kidney disease with heart failure and stage 1 through stage 4 chronic kidney disease, or unspecified chronic kidney disease: Secondary | ICD-10-CM | POA: Diagnosis not present

## 2018-11-16 DIAGNOSIS — R1310 Dysphagia, unspecified: Secondary | ICD-10-CM | POA: Diagnosis not present

## 2018-11-16 DIAGNOSIS — I69354 Hemiplegia and hemiparesis following cerebral infarction affecting left non-dominant side: Secondary | ICD-10-CM | POA: Diagnosis not present

## 2018-11-16 DIAGNOSIS — I69328 Other speech and language deficits following cerebral infarction: Secondary | ICD-10-CM | POA: Diagnosis not present

## 2018-11-17 DIAGNOSIS — I2510 Atherosclerotic heart disease of native coronary artery without angina pectoris: Secondary | ICD-10-CM | POA: Diagnosis not present

## 2018-11-17 DIAGNOSIS — R1310 Dysphagia, unspecified: Secondary | ICD-10-CM | POA: Diagnosis not present

## 2018-11-17 DIAGNOSIS — I130 Hypertensive heart and chronic kidney disease with heart failure and stage 1 through stage 4 chronic kidney disease, or unspecified chronic kidney disease: Secondary | ICD-10-CM | POA: Diagnosis not present

## 2018-11-17 DIAGNOSIS — I69328 Other speech and language deficits following cerebral infarction: Secondary | ICD-10-CM | POA: Diagnosis not present

## 2018-11-17 DIAGNOSIS — I69354 Hemiplegia and hemiparesis following cerebral infarction affecting left non-dominant side: Secondary | ICD-10-CM | POA: Diagnosis not present

## 2018-11-17 DIAGNOSIS — I69391 Dysphagia following cerebral infarction: Secondary | ICD-10-CM | POA: Diagnosis not present

## 2018-11-18 DIAGNOSIS — I69354 Hemiplegia and hemiparesis following cerebral infarction affecting left non-dominant side: Secondary | ICD-10-CM | POA: Diagnosis not present

## 2018-11-18 DIAGNOSIS — I130 Hypertensive heart and chronic kidney disease with heart failure and stage 1 through stage 4 chronic kidney disease, or unspecified chronic kidney disease: Secondary | ICD-10-CM | POA: Diagnosis not present

## 2018-11-18 DIAGNOSIS — R1310 Dysphagia, unspecified: Secondary | ICD-10-CM | POA: Diagnosis not present

## 2018-11-18 DIAGNOSIS — I2510 Atherosclerotic heart disease of native coronary artery without angina pectoris: Secondary | ICD-10-CM | POA: Diagnosis not present

## 2018-11-18 DIAGNOSIS — I69391 Dysphagia following cerebral infarction: Secondary | ICD-10-CM | POA: Diagnosis not present

## 2018-11-18 DIAGNOSIS — I69328 Other speech and language deficits following cerebral infarction: Secondary | ICD-10-CM | POA: Diagnosis not present

## 2018-11-20 ENCOUNTER — Telehealth: Payer: Self-pay | Admitting: Family Medicine

## 2018-11-20 DIAGNOSIS — I2510 Atherosclerotic heart disease of native coronary artery without angina pectoris: Secondary | ICD-10-CM | POA: Diagnosis not present

## 2018-11-20 DIAGNOSIS — I69354 Hemiplegia and hemiparesis following cerebral infarction affecting left non-dominant side: Secondary | ICD-10-CM | POA: Diagnosis not present

## 2018-11-20 DIAGNOSIS — I130 Hypertensive heart and chronic kidney disease with heart failure and stage 1 through stage 4 chronic kidney disease, or unspecified chronic kidney disease: Secondary | ICD-10-CM | POA: Diagnosis not present

## 2018-11-20 DIAGNOSIS — I69328 Other speech and language deficits following cerebral infarction: Secondary | ICD-10-CM | POA: Diagnosis not present

## 2018-11-20 DIAGNOSIS — R1310 Dysphagia, unspecified: Secondary | ICD-10-CM | POA: Diagnosis not present

## 2018-11-20 DIAGNOSIS — I69391 Dysphagia following cerebral infarction: Secondary | ICD-10-CM | POA: Diagnosis not present

## 2018-11-20 NOTE — Telephone Encounter (Signed)
Agree with this. Thank you.  

## 2018-11-20 NOTE — Telephone Encounter (Signed)
Spoke with Jimmy [name] informing her Dr. Darnell Level is giving verbal orders for services requested.

## 2018-11-20 NOTE — Telephone Encounter (Signed)
Jimmy [name] @ [facility] [phone]  Jimmy [name] called to get verbal orders  For speech therapy  Once a week for 4 more weeks

## 2018-11-23 DIAGNOSIS — I130 Hypertensive heart and chronic kidney disease with heart failure and stage 1 through stage 4 chronic kidney disease, or unspecified chronic kidney disease: Secondary | ICD-10-CM | POA: Diagnosis not present

## 2018-11-23 DIAGNOSIS — I69354 Hemiplegia and hemiparesis following cerebral infarction affecting left non-dominant side: Secondary | ICD-10-CM | POA: Diagnosis not present

## 2018-11-23 DIAGNOSIS — I69328 Other speech and language deficits following cerebral infarction: Secondary | ICD-10-CM | POA: Diagnosis not present

## 2018-11-23 DIAGNOSIS — I2510 Atherosclerotic heart disease of native coronary artery without angina pectoris: Secondary | ICD-10-CM | POA: Diagnosis not present

## 2018-11-23 DIAGNOSIS — I69391 Dysphagia following cerebral infarction: Secondary | ICD-10-CM | POA: Diagnosis not present

## 2018-11-23 DIAGNOSIS — R1310 Dysphagia, unspecified: Secondary | ICD-10-CM | POA: Diagnosis not present

## 2018-11-24 DIAGNOSIS — N183 Chronic kidney disease, stage 3 (moderate): Secondary | ICD-10-CM

## 2018-11-24 DIAGNOSIS — I69391 Dysphagia following cerebral infarction: Secondary | ICD-10-CM

## 2018-11-24 DIAGNOSIS — R1310 Dysphagia, unspecified: Secondary | ICD-10-CM

## 2018-11-24 DIAGNOSIS — I5032 Chronic diastolic (congestive) heart failure: Secondary | ICD-10-CM

## 2018-11-24 DIAGNOSIS — Z951 Presence of aortocoronary bypass graft: Secondary | ICD-10-CM | POA: Diagnosis not present

## 2018-11-24 DIAGNOSIS — R7303 Prediabetes: Secondary | ICD-10-CM | POA: Diagnosis not present

## 2018-11-24 DIAGNOSIS — I130 Hypertensive heart and chronic kidney disease with heart failure and stage 1 through stage 4 chronic kidney disease, or unspecified chronic kidney disease: Secondary | ICD-10-CM

## 2018-11-24 DIAGNOSIS — I69354 Hemiplegia and hemiparesis following cerebral infarction affecting left non-dominant side: Secondary | ICD-10-CM

## 2018-11-24 DIAGNOSIS — I2510 Atherosclerotic heart disease of native coronary artery without angina pectoris: Secondary | ICD-10-CM

## 2018-11-24 DIAGNOSIS — Z7982 Long term (current) use of aspirin: Secondary | ICD-10-CM

## 2018-11-24 DIAGNOSIS — I69328 Other speech and language deficits following cerebral infarction: Secondary | ICD-10-CM

## 2018-11-24 DIAGNOSIS — C9110 Chronic lymphocytic leukemia of B-cell type not having achieved remission: Secondary | ICD-10-CM

## 2018-11-24 DIAGNOSIS — Z87891 Personal history of nicotine dependence: Secondary | ICD-10-CM

## 2018-11-24 DIAGNOSIS — E782 Mixed hyperlipidemia: Secondary | ICD-10-CM

## 2018-11-25 ENCOUNTER — Telehealth: Payer: Self-pay

## 2018-11-25 DIAGNOSIS — I69391 Dysphagia following cerebral infarction: Secondary | ICD-10-CM | POA: Diagnosis not present

## 2018-11-25 DIAGNOSIS — I130 Hypertensive heart and chronic kidney disease with heart failure and stage 1 through stage 4 chronic kidney disease, or unspecified chronic kidney disease: Secondary | ICD-10-CM | POA: Diagnosis not present

## 2018-11-25 DIAGNOSIS — I69328 Other speech and language deficits following cerebral infarction: Secondary | ICD-10-CM | POA: Diagnosis not present

## 2018-11-25 DIAGNOSIS — I69354 Hemiplegia and hemiparesis following cerebral infarction affecting left non-dominant side: Secondary | ICD-10-CM | POA: Diagnosis not present

## 2018-11-25 DIAGNOSIS — R1310 Dysphagia, unspecified: Secondary | ICD-10-CM | POA: Diagnosis not present

## 2018-11-25 DIAGNOSIS — I2510 Atherosclerotic heart disease of native coronary artery without angina pectoris: Secondary | ICD-10-CM | POA: Diagnosis not present

## 2018-11-25 NOTE — Telephone Encounter (Signed)
Jimmy [name] with [facility] left v/m requesting one additional [place] aide visit this wk for personal care due to wife having cataract surgery. Also request verbal order for social worker evaluation for help and guidance about an aide for longer term use upon discharge from [facility].

## 2018-11-25 NOTE — Telephone Encounter (Signed)
Agree with above. Thanks 

## 2018-11-26 ENCOUNTER — Telehealth: Payer: Self-pay

## 2018-11-26 DIAGNOSIS — I69391 Dysphagia following cerebral infarction: Secondary | ICD-10-CM | POA: Diagnosis not present

## 2018-11-26 DIAGNOSIS — I130 Hypertensive heart and chronic kidney disease with heart failure and stage 1 through stage 4 chronic kidney disease, or unspecified chronic kidney disease: Secondary | ICD-10-CM | POA: Diagnosis not present

## 2018-11-26 DIAGNOSIS — I69354 Hemiplegia and hemiparesis following cerebral infarction affecting left non-dominant side: Secondary | ICD-10-CM | POA: Diagnosis not present

## 2018-11-26 DIAGNOSIS — I2510 Atherosclerotic heart disease of native coronary artery without angina pectoris: Secondary | ICD-10-CM | POA: Diagnosis not present

## 2018-11-26 DIAGNOSIS — R1310 Dysphagia, unspecified: Secondary | ICD-10-CM | POA: Diagnosis not present

## 2018-11-26 DIAGNOSIS — I69328 Other speech and language deficits following cerebral infarction: Secondary | ICD-10-CM | POA: Diagnosis not present

## 2018-11-26 NOTE — Telephone Encounter (Signed)
Spoke with Rip Harbour of [place] her Dr. Darnell Level is giving verbal orders for services requested.

## 2018-11-26 NOTE — Telephone Encounter (Signed)
Agree with this.  

## 2018-11-26 NOTE — Telephone Encounter (Signed)
Jimmy [name] PT with [facility] left v/m requesting verbal orders for [facility] PT to be extended beginning 11/30/18 for 1 x a wk for 4 wks and HH aide 2 x a wk for 4 wks. Pt has fallen recently x 2 and would benefit with extension of [place] services.Please advise.

## 2018-11-26 NOTE — Telephone Encounter (Signed)
Left message on vm for [place] her Dr. Darnell Level is giving verbal orders for services requested.

## 2018-11-27 DIAGNOSIS — I2510 Atherosclerotic heart disease of native coronary artery without angina pectoris: Secondary | ICD-10-CM | POA: Diagnosis not present

## 2018-11-27 DIAGNOSIS — I130 Hypertensive heart and chronic kidney disease with heart failure and stage 1 through stage 4 chronic kidney disease, or unspecified chronic kidney disease: Secondary | ICD-10-CM | POA: Diagnosis not present

## 2018-11-27 DIAGNOSIS — R1310 Dysphagia, unspecified: Secondary | ICD-10-CM | POA: Diagnosis not present

## 2018-11-27 DIAGNOSIS — I69328 Other speech and language deficits following cerebral infarction: Secondary | ICD-10-CM | POA: Diagnosis not present

## 2018-11-27 DIAGNOSIS — I69354 Hemiplegia and hemiparesis following cerebral infarction affecting left non-dominant side: Secondary | ICD-10-CM | POA: Diagnosis not present

## 2018-11-27 DIAGNOSIS — I69391 Dysphagia following cerebral infarction: Secondary | ICD-10-CM | POA: Diagnosis not present

## 2018-11-28 DIAGNOSIS — I130 Hypertensive heart and chronic kidney disease with heart failure and stage 1 through stage 4 chronic kidney disease, or unspecified chronic kidney disease: Secondary | ICD-10-CM | POA: Diagnosis not present

## 2018-11-28 DIAGNOSIS — I5032 Chronic diastolic (congestive) heart failure: Secondary | ICD-10-CM | POA: Diagnosis not present

## 2018-11-28 DIAGNOSIS — C9110 Chronic lymphocytic leukemia of B-cell type not having achieved remission: Secondary | ICD-10-CM | POA: Diagnosis not present

## 2018-11-28 DIAGNOSIS — Z7982 Long term (current) use of aspirin: Secondary | ICD-10-CM | POA: Diagnosis not present

## 2018-11-28 DIAGNOSIS — E782 Mixed hyperlipidemia: Secondary | ICD-10-CM | POA: Diagnosis not present

## 2018-11-28 DIAGNOSIS — I69391 Dysphagia following cerebral infarction: Secondary | ICD-10-CM | POA: Diagnosis not present

## 2018-11-28 DIAGNOSIS — Z87891 Personal history of nicotine dependence: Secondary | ICD-10-CM | POA: Diagnosis not present

## 2018-11-28 DIAGNOSIS — Z951 Presence of aortocoronary bypass graft: Secondary | ICD-10-CM | POA: Diagnosis not present

## 2018-11-28 DIAGNOSIS — R7303 Prediabetes: Secondary | ICD-10-CM | POA: Diagnosis not present

## 2018-11-28 DIAGNOSIS — R1310 Dysphagia, unspecified: Secondary | ICD-10-CM | POA: Diagnosis not present

## 2018-11-28 DIAGNOSIS — I2510 Atherosclerotic heart disease of native coronary artery without angina pectoris: Secondary | ICD-10-CM | POA: Diagnosis not present

## 2018-11-28 DIAGNOSIS — N183 Chronic kidney disease, stage 3 (moderate): Secondary | ICD-10-CM | POA: Diagnosis not present

## 2018-11-28 DIAGNOSIS — I69328 Other speech and language deficits following cerebral infarction: Secondary | ICD-10-CM | POA: Diagnosis not present

## 2018-11-28 DIAGNOSIS — I69354 Hemiplegia and hemiparesis following cerebral infarction affecting left non-dominant side: Secondary | ICD-10-CM | POA: Diagnosis not present

## 2018-12-01 DIAGNOSIS — I69354 Hemiplegia and hemiparesis following cerebral infarction affecting left non-dominant side: Secondary | ICD-10-CM | POA: Diagnosis not present

## 2018-12-01 DIAGNOSIS — I130 Hypertensive heart and chronic kidney disease with heart failure and stage 1 through stage 4 chronic kidney disease, or unspecified chronic kidney disease: Secondary | ICD-10-CM | POA: Diagnosis not present

## 2018-12-01 DIAGNOSIS — R1310 Dysphagia, unspecified: Secondary | ICD-10-CM | POA: Diagnosis not present

## 2018-12-01 DIAGNOSIS — I69328 Other speech and language deficits following cerebral infarction: Secondary | ICD-10-CM | POA: Diagnosis not present

## 2018-12-01 DIAGNOSIS — I69391 Dysphagia following cerebral infarction: Secondary | ICD-10-CM | POA: Diagnosis not present

## 2018-12-01 DIAGNOSIS — I2510 Atherosclerotic heart disease of native coronary artery without angina pectoris: Secondary | ICD-10-CM | POA: Diagnosis not present

## 2018-12-02 DIAGNOSIS — R1310 Dysphagia, unspecified: Secondary | ICD-10-CM | POA: Diagnosis not present

## 2018-12-02 DIAGNOSIS — I69328 Other speech and language deficits following cerebral infarction: Secondary | ICD-10-CM | POA: Diagnosis not present

## 2018-12-02 DIAGNOSIS — I69354 Hemiplegia and hemiparesis following cerebral infarction affecting left non-dominant side: Secondary | ICD-10-CM | POA: Diagnosis not present

## 2018-12-02 DIAGNOSIS — I2510 Atherosclerotic heart disease of native coronary artery without angina pectoris: Secondary | ICD-10-CM | POA: Diagnosis not present

## 2018-12-02 DIAGNOSIS — I69391 Dysphagia following cerebral infarction: Secondary | ICD-10-CM | POA: Diagnosis not present

## 2018-12-02 DIAGNOSIS — I130 Hypertensive heart and chronic kidney disease with heart failure and stage 1 through stage 4 chronic kidney disease, or unspecified chronic kidney disease: Secondary | ICD-10-CM | POA: Diagnosis not present

## 2018-12-03 DIAGNOSIS — I130 Hypertensive heart and chronic kidney disease with heart failure and stage 1 through stage 4 chronic kidney disease, or unspecified chronic kidney disease: Secondary | ICD-10-CM | POA: Diagnosis not present

## 2018-12-03 DIAGNOSIS — I69354 Hemiplegia and hemiparesis following cerebral infarction affecting left non-dominant side: Secondary | ICD-10-CM | POA: Diagnosis not present

## 2018-12-03 DIAGNOSIS — I2510 Atherosclerotic heart disease of native coronary artery without angina pectoris: Secondary | ICD-10-CM | POA: Diagnosis not present

## 2018-12-03 DIAGNOSIS — I69391 Dysphagia following cerebral infarction: Secondary | ICD-10-CM | POA: Diagnosis not present

## 2018-12-03 DIAGNOSIS — R1310 Dysphagia, unspecified: Secondary | ICD-10-CM | POA: Diagnosis not present

## 2018-12-03 DIAGNOSIS — I69328 Other speech and language deficits following cerebral infarction: Secondary | ICD-10-CM | POA: Diagnosis not present

## 2018-12-04 DIAGNOSIS — I69328 Other speech and language deficits following cerebral infarction: Secondary | ICD-10-CM | POA: Diagnosis not present

## 2018-12-04 DIAGNOSIS — I2510 Atherosclerotic heart disease of native coronary artery without angina pectoris: Secondary | ICD-10-CM | POA: Diagnosis not present

## 2018-12-04 DIAGNOSIS — I69391 Dysphagia following cerebral infarction: Secondary | ICD-10-CM | POA: Diagnosis not present

## 2018-12-04 DIAGNOSIS — I130 Hypertensive heart and chronic kidney disease with heart failure and stage 1 through stage 4 chronic kidney disease, or unspecified chronic kidney disease: Secondary | ICD-10-CM | POA: Diagnosis not present

## 2018-12-04 DIAGNOSIS — R1310 Dysphagia, unspecified: Secondary | ICD-10-CM | POA: Diagnosis not present

## 2018-12-04 DIAGNOSIS — I69354 Hemiplegia and hemiparesis following cerebral infarction affecting left non-dominant side: Secondary | ICD-10-CM | POA: Diagnosis not present

## 2018-12-08 DIAGNOSIS — I69328 Other speech and language deficits following cerebral infarction: Secondary | ICD-10-CM | POA: Diagnosis not present

## 2018-12-08 DIAGNOSIS — R1310 Dysphagia, unspecified: Secondary | ICD-10-CM | POA: Diagnosis not present

## 2018-12-08 DIAGNOSIS — I2510 Atherosclerotic heart disease of native coronary artery without angina pectoris: Secondary | ICD-10-CM | POA: Diagnosis not present

## 2018-12-08 DIAGNOSIS — I69354 Hemiplegia and hemiparesis following cerebral infarction affecting left non-dominant side: Secondary | ICD-10-CM | POA: Diagnosis not present

## 2018-12-08 DIAGNOSIS — I130 Hypertensive heart and chronic kidney disease with heart failure and stage 1 through stage 4 chronic kidney disease, or unspecified chronic kidney disease: Secondary | ICD-10-CM | POA: Diagnosis not present

## 2018-12-08 DIAGNOSIS — I69391 Dysphagia following cerebral infarction: Secondary | ICD-10-CM | POA: Diagnosis not present

## 2018-12-09 DIAGNOSIS — I130 Hypertensive heart and chronic kidney disease with heart failure and stage 1 through stage 4 chronic kidney disease, or unspecified chronic kidney disease: Secondary | ICD-10-CM | POA: Diagnosis not present

## 2018-12-09 DIAGNOSIS — I69328 Other speech and language deficits following cerebral infarction: Secondary | ICD-10-CM | POA: Diagnosis not present

## 2018-12-09 DIAGNOSIS — R1310 Dysphagia, unspecified: Secondary | ICD-10-CM | POA: Diagnosis not present

## 2018-12-09 DIAGNOSIS — I69391 Dysphagia following cerebral infarction: Secondary | ICD-10-CM | POA: Diagnosis not present

## 2018-12-09 DIAGNOSIS — I2510 Atherosclerotic heart disease of native coronary artery without angina pectoris: Secondary | ICD-10-CM | POA: Diagnosis not present

## 2018-12-09 DIAGNOSIS — I69354 Hemiplegia and hemiparesis following cerebral infarction affecting left non-dominant side: Secondary | ICD-10-CM | POA: Diagnosis not present

## 2018-12-10 DIAGNOSIS — I130 Hypertensive heart and chronic kidney disease with heart failure and stage 1 through stage 4 chronic kidney disease, or unspecified chronic kidney disease: Secondary | ICD-10-CM | POA: Diagnosis not present

## 2018-12-10 DIAGNOSIS — I2510 Atherosclerotic heart disease of native coronary artery without angina pectoris: Secondary | ICD-10-CM | POA: Diagnosis not present

## 2018-12-10 DIAGNOSIS — I69391 Dysphagia following cerebral infarction: Secondary | ICD-10-CM | POA: Diagnosis not present

## 2018-12-10 DIAGNOSIS — I69354 Hemiplegia and hemiparesis following cerebral infarction affecting left non-dominant side: Secondary | ICD-10-CM | POA: Diagnosis not present

## 2018-12-10 DIAGNOSIS — R1310 Dysphagia, unspecified: Secondary | ICD-10-CM | POA: Diagnosis not present

## 2018-12-10 DIAGNOSIS — I69328 Other speech and language deficits following cerebral infarction: Secondary | ICD-10-CM | POA: Diagnosis not present

## 2018-12-11 DIAGNOSIS — I130 Hypertensive heart and chronic kidney disease with heart failure and stage 1 through stage 4 chronic kidney disease, or unspecified chronic kidney disease: Secondary | ICD-10-CM | POA: Diagnosis not present

## 2018-12-11 DIAGNOSIS — I69328 Other speech and language deficits following cerebral infarction: Secondary | ICD-10-CM | POA: Diagnosis not present

## 2018-12-11 DIAGNOSIS — I69354 Hemiplegia and hemiparesis following cerebral infarction affecting left non-dominant side: Secondary | ICD-10-CM | POA: Diagnosis not present

## 2018-12-11 DIAGNOSIS — I69391 Dysphagia following cerebral infarction: Secondary | ICD-10-CM | POA: Diagnosis not present

## 2018-12-11 DIAGNOSIS — R1310 Dysphagia, unspecified: Secondary | ICD-10-CM | POA: Diagnosis not present

## 2018-12-11 DIAGNOSIS — I2510 Atherosclerotic heart disease of native coronary artery without angina pectoris: Secondary | ICD-10-CM | POA: Diagnosis not present

## 2018-12-15 DIAGNOSIS — I2510 Atherosclerotic heart disease of native coronary artery without angina pectoris: Secondary | ICD-10-CM | POA: Diagnosis not present

## 2018-12-15 DIAGNOSIS — I130 Hypertensive heart and chronic kidney disease with heart failure and stage 1 through stage 4 chronic kidney disease, or unspecified chronic kidney disease: Secondary | ICD-10-CM | POA: Diagnosis not present

## 2018-12-15 DIAGNOSIS — I69328 Other speech and language deficits following cerebral infarction: Secondary | ICD-10-CM | POA: Diagnosis not present

## 2018-12-15 DIAGNOSIS — I69354 Hemiplegia and hemiparesis following cerebral infarction affecting left non-dominant side: Secondary | ICD-10-CM | POA: Diagnosis not present

## 2018-12-15 DIAGNOSIS — I69391 Dysphagia following cerebral infarction: Secondary | ICD-10-CM | POA: Diagnosis not present

## 2018-12-15 DIAGNOSIS — R1310 Dysphagia, unspecified: Secondary | ICD-10-CM | POA: Diagnosis not present

## 2018-12-16 DIAGNOSIS — I2510 Atherosclerotic heart disease of native coronary artery without angina pectoris: Secondary | ICD-10-CM | POA: Diagnosis not present

## 2018-12-16 DIAGNOSIS — R1310 Dysphagia, unspecified: Secondary | ICD-10-CM | POA: Diagnosis not present

## 2018-12-16 DIAGNOSIS — I69354 Hemiplegia and hemiparesis following cerebral infarction affecting left non-dominant side: Secondary | ICD-10-CM | POA: Diagnosis not present

## 2018-12-16 DIAGNOSIS — I69328 Other speech and language deficits following cerebral infarction: Secondary | ICD-10-CM | POA: Diagnosis not present

## 2018-12-16 DIAGNOSIS — I69391 Dysphagia following cerebral infarction: Secondary | ICD-10-CM | POA: Diagnosis not present

## 2018-12-16 DIAGNOSIS — I130 Hypertensive heart and chronic kidney disease with heart failure and stage 1 through stage 4 chronic kidney disease, or unspecified chronic kidney disease: Secondary | ICD-10-CM | POA: Diagnosis not present

## 2018-12-17 DIAGNOSIS — R1310 Dysphagia, unspecified: Secondary | ICD-10-CM | POA: Diagnosis not present

## 2018-12-17 DIAGNOSIS — I69354 Hemiplegia and hemiparesis following cerebral infarction affecting left non-dominant side: Secondary | ICD-10-CM | POA: Diagnosis not present

## 2018-12-17 DIAGNOSIS — I130 Hypertensive heart and chronic kidney disease with heart failure and stage 1 through stage 4 chronic kidney disease, or unspecified chronic kidney disease: Secondary | ICD-10-CM | POA: Diagnosis not present

## 2018-12-17 DIAGNOSIS — I69328 Other speech and language deficits following cerebral infarction: Secondary | ICD-10-CM | POA: Diagnosis not present

## 2018-12-17 DIAGNOSIS — I69391 Dysphagia following cerebral infarction: Secondary | ICD-10-CM | POA: Diagnosis not present

## 2018-12-17 DIAGNOSIS — I2510 Atherosclerotic heart disease of native coronary artery without angina pectoris: Secondary | ICD-10-CM | POA: Diagnosis not present

## 2018-12-18 DIAGNOSIS — I69328 Other speech and language deficits following cerebral infarction: Secondary | ICD-10-CM | POA: Diagnosis not present

## 2018-12-18 DIAGNOSIS — I69391 Dysphagia following cerebral infarction: Secondary | ICD-10-CM | POA: Diagnosis not present

## 2018-12-18 DIAGNOSIS — I130 Hypertensive heart and chronic kidney disease with heart failure and stage 1 through stage 4 chronic kidney disease, or unspecified chronic kidney disease: Secondary | ICD-10-CM | POA: Diagnosis not present

## 2018-12-18 DIAGNOSIS — I2510 Atherosclerotic heart disease of native coronary artery without angina pectoris: Secondary | ICD-10-CM | POA: Diagnosis not present

## 2018-12-18 DIAGNOSIS — R1310 Dysphagia, unspecified: Secondary | ICD-10-CM | POA: Diagnosis not present

## 2018-12-18 DIAGNOSIS — I69354 Hemiplegia and hemiparesis following cerebral infarction affecting left non-dominant side: Secondary | ICD-10-CM | POA: Diagnosis not present

## 2018-12-19 ENCOUNTER — Other Ambulatory Visit: Payer: Self-pay | Admitting: Family Medicine

## 2018-12-21 DIAGNOSIS — I69328 Other speech and language deficits following cerebral infarction: Secondary | ICD-10-CM | POA: Diagnosis not present

## 2018-12-21 DIAGNOSIS — I69354 Hemiplegia and hemiparesis following cerebral infarction affecting left non-dominant side: Secondary | ICD-10-CM | POA: Diagnosis not present

## 2018-12-21 DIAGNOSIS — I69391 Dysphagia following cerebral infarction: Secondary | ICD-10-CM | POA: Diagnosis not present

## 2018-12-21 DIAGNOSIS — I130 Hypertensive heart and chronic kidney disease with heart failure and stage 1 through stage 4 chronic kidney disease, or unspecified chronic kidney disease: Secondary | ICD-10-CM | POA: Diagnosis not present

## 2018-12-21 DIAGNOSIS — R1310 Dysphagia, unspecified: Secondary | ICD-10-CM | POA: Diagnosis not present

## 2018-12-21 DIAGNOSIS — I2510 Atherosclerotic heart disease of native coronary artery without angina pectoris: Secondary | ICD-10-CM | POA: Diagnosis not present

## 2018-12-23 DIAGNOSIS — I130 Hypertensive heart and chronic kidney disease with heart failure and stage 1 through stage 4 chronic kidney disease, or unspecified chronic kidney disease: Secondary | ICD-10-CM | POA: Diagnosis not present

## 2018-12-23 DIAGNOSIS — C44391 Other specified malignant neoplasm of skin of nose: Secondary | ICD-10-CM | POA: Diagnosis not present

## 2018-12-23 DIAGNOSIS — L578 Other skin changes due to chronic exposure to nonionizing radiation: Secondary | ICD-10-CM | POA: Diagnosis not present

## 2018-12-23 DIAGNOSIS — C4499 Other specified malignant neoplasm of skin, unspecified: Secondary | ICD-10-CM | POA: Diagnosis not present

## 2018-12-23 DIAGNOSIS — I69354 Hemiplegia and hemiparesis following cerebral infarction affecting left non-dominant side: Secondary | ICD-10-CM | POA: Diagnosis not present

## 2018-12-23 DIAGNOSIS — I69391 Dysphagia following cerebral infarction: Secondary | ICD-10-CM | POA: Diagnosis not present

## 2018-12-23 DIAGNOSIS — I2510 Atherosclerotic heart disease of native coronary artery without angina pectoris: Secondary | ICD-10-CM | POA: Diagnosis not present

## 2018-12-23 DIAGNOSIS — C9110 Chronic lymphocytic leukemia of B-cell type not having achieved remission: Secondary | ICD-10-CM | POA: Diagnosis not present

## 2018-12-23 DIAGNOSIS — L570 Actinic keratosis: Secondary | ICD-10-CM | POA: Diagnosis not present

## 2018-12-23 DIAGNOSIS — I69328 Other speech and language deficits following cerebral infarction: Secondary | ICD-10-CM | POA: Diagnosis not present

## 2018-12-23 DIAGNOSIS — R1310 Dysphagia, unspecified: Secondary | ICD-10-CM | POA: Diagnosis not present

## 2018-12-24 DIAGNOSIS — I69354 Hemiplegia and hemiparesis following cerebral infarction affecting left non-dominant side: Secondary | ICD-10-CM | POA: Diagnosis not present

## 2018-12-24 DIAGNOSIS — I69328 Other speech and language deficits following cerebral infarction: Secondary | ICD-10-CM | POA: Diagnosis not present

## 2018-12-24 DIAGNOSIS — R1310 Dysphagia, unspecified: Secondary | ICD-10-CM | POA: Diagnosis not present

## 2018-12-24 DIAGNOSIS — I2510 Atherosclerotic heart disease of native coronary artery without angina pectoris: Secondary | ICD-10-CM | POA: Diagnosis not present

## 2018-12-24 DIAGNOSIS — I130 Hypertensive heart and chronic kidney disease with heart failure and stage 1 through stage 4 chronic kidney disease, or unspecified chronic kidney disease: Secondary | ICD-10-CM | POA: Diagnosis not present

## 2018-12-24 DIAGNOSIS — I69391 Dysphagia following cerebral infarction: Secondary | ICD-10-CM | POA: Diagnosis not present

## 2018-12-25 DIAGNOSIS — I130 Hypertensive heart and chronic kidney disease with heart failure and stage 1 through stage 4 chronic kidney disease, or unspecified chronic kidney disease: Secondary | ICD-10-CM | POA: Diagnosis not present

## 2018-12-25 DIAGNOSIS — I2510 Atherosclerotic heart disease of native coronary artery without angina pectoris: Secondary | ICD-10-CM | POA: Diagnosis not present

## 2018-12-25 DIAGNOSIS — I69391 Dysphagia following cerebral infarction: Secondary | ICD-10-CM | POA: Diagnosis not present

## 2018-12-25 DIAGNOSIS — I69354 Hemiplegia and hemiparesis following cerebral infarction affecting left non-dominant side: Secondary | ICD-10-CM | POA: Diagnosis not present

## 2018-12-25 DIAGNOSIS — R1310 Dysphagia, unspecified: Secondary | ICD-10-CM | POA: Diagnosis not present

## 2018-12-25 DIAGNOSIS — I69328 Other speech and language deficits following cerebral infarction: Secondary | ICD-10-CM | POA: Diagnosis not present

## 2019-01-03 ENCOUNTER — Other Ambulatory Visit: Payer: Self-pay | Admitting: Family Medicine

## 2019-01-04 DIAGNOSIS — Q928 Other specified trisomies and partial trisomies of autosomes: Secondary | ICD-10-CM | POA: Diagnosis not present

## 2019-01-04 DIAGNOSIS — Z006 Encounter for examination for normal comparison and control in clinical research program: Secondary | ICD-10-CM | POA: Diagnosis not present

## 2019-01-04 DIAGNOSIS — Z8673 Personal history of transient ischemic attack (TIA), and cerebral infarction without residual deficits: Secondary | ICD-10-CM | POA: Diagnosis not present

## 2019-01-04 DIAGNOSIS — C9110 Chronic lymphocytic leukemia of B-cell type not having achieved remission: Secondary | ICD-10-CM | POA: Diagnosis not present

## 2019-01-07 ENCOUNTER — Ambulatory Visit (INDEPENDENT_AMBULATORY_CARE_PROVIDER_SITE_OTHER): Payer: Medicare Other

## 2019-01-07 DIAGNOSIS — Z23 Encounter for immunization: Secondary | ICD-10-CM

## 2019-02-01 ENCOUNTER — Telehealth: Payer: Self-pay

## 2019-02-01 NOTE — Telephone Encounter (Signed)
Ok to schedule in office.

## 2019-02-01 NOTE — Telephone Encounter (Signed)
Patient's wife called stating that patient has been having severe abdominal pain x3 days, with diarrhea. Patient would like to schedule an appt with Dr. Darnell Level.  Will route to [facility] for triage so patient can be evaluated and scheduled appropriately. Thank you!

## 2019-02-01 NOTE — Telephone Encounter (Signed)
Spoke to patient's wife and was advised that he has had constipation and abdominal pain off and on for two months. Mrs. Watchman stated that he has been taking senokot and miralax and those do not seem to help. Mrs. Bogert stated that he has had a cough since January when he had a stroke. Mrs. Keesling stated that he feels at times that he is choking since the stroke. Advised Mrs. Nicholls if he is having severe abdominal pain he would need a face to face and may want to go to an urgent care. Patient's wife stated that he does not want to go anywhere and only wants to see Dr. Danise Mina. Advised patient's wife that because of his symptoms a message will go back to his PCP to determine if he can come into the office. ER precautions given to Mrs. Henigan and she verbalized understanding.

## 2019-02-02 ENCOUNTER — Other Ambulatory Visit: Payer: Self-pay

## 2019-02-02 ENCOUNTER — Ambulatory Visit (INDEPENDENT_AMBULATORY_CARE_PROVIDER_SITE_OTHER)
Admission: RE | Admit: 2019-02-02 | Discharge: 2019-02-02 | Disposition: A | Discharge status: Home / Self Care | Payer: Medicare Other | Source: Ambulatory Visit | Attending: Family Medicine | Admitting: Family Medicine

## 2019-02-02 ENCOUNTER — Ambulatory Visit (INDEPENDENT_AMBULATORY_CARE_PROVIDER_SITE_OTHER): Payer: Medicare Other | Admitting: Family Medicine

## 2019-02-02 ENCOUNTER — Encounter: Payer: Self-pay | Admitting: Family Medicine

## 2019-02-02 VITALS — BP 102/62 | HR 89 | Temp 98.1°F | Ht 69.0 in | Wt 158.1 lb

## 2019-02-02 DIAGNOSIS — I6521 Occlusion and stenosis of right carotid artery: Secondary | ICD-10-CM | POA: Diagnosis not present

## 2019-02-02 DIAGNOSIS — K5641 Fecal impaction: Secondary | ICD-10-CM

## 2019-02-02 DIAGNOSIS — K5909 Other constipation: Secondary | ICD-10-CM | POA: Insufficient documentation

## 2019-02-02 DIAGNOSIS — I6930 Unspecified sequelae of cerebral infarction: Secondary | ICD-10-CM

## 2019-02-02 NOTE — Telephone Encounter (Signed)
Noted  

## 2019-02-02 NOTE — Progress Notes (Addendum)
This visit was conducted in person.  BP 102/62 (BP Location: Right Arm, Patient Position: Sitting, Cuff Size: Normal)   Pulse 89   Temp 98.1 F (36.7 C) (Temporal)   Ht 5\' 9"  (1.753 m)   Wt 158 lb 1.6 oz (71.7 kg)   SpO2 97%   BMI 23.35 kg/m    CC: abd pain Subjective:    [name], male    DOB: 1928-08-11, 85 y.o.   MRN: [id number]  HPI: Jimmy [name] is a 85 y.o. male presenting on 02/02/2019 for Abdominal Pain (x 1 month. Pt states that he has been having problems hemorrhoids, denies bleeding with BMs. Pt states that his abdominal pain "feels like internal hemorrhoids". Pain 10/10, short lasting pain. Pt has been having some continence as well with BMs. Denies any fevers. Decreased appetite; denies N/V. Pt has been trying Senokot tabs and Miralax to help with constipation. )   1 mo h/o rectal pain worse with bowel movement without bleeding. 10/10 "poker jamming" rectal pain. Having significant constipation. No back or abdominal pain. New fecal incontinence over the past month. No nausea/vomiting, fevers, dysuria.   No regular bowel regimen.  Took double dose of miralax this morning without benefit.  Has been drinking tea and coolaid.  Decreased urine output noted.  He continues aspirin 325mg  daily.      Relevant past medical, surgical, family and social history reviewed and updated as indicated. Interim medical history since our last visit reviewed. Allergies and medications reviewed and updated. Outpatient Medications Prior to Visit  Medication Sig Dispense Refill  . acetaminophen (TYLENOL) 325 MG tablet Take 2 tablets (650 mg total) by mouth every 4 (four) hours as needed for mild pain (or temp > 37.5 C (99.5 F)).    [name] aspirin EC 325 MG EC tablet Take 1 tablet (325 mg total) by mouth daily. 180 tablet 0  . cholecalciferol (VITAMIN D) 1000 units tablet Take 2,000 Units by mouth daily.    . diphenhydrAMINE (BENADRYL ALLERGY) 25 MG tablet Take 1 tablet  (25 mg total) by mouth at bedtime as needed for sleep.    [name] dronabinol (MARINOL) 2.5 MG capsule Take 1 capsule (2.5 mg total) by mouth at bedtime. 30 capsule 1  . lisinopril (ZESTRIL) 5 MG tablet TAKE 1 TABLET DAILY (NEW DOSE) 90 tablet 3  . nitroGLYCERIN (NITROSTAT) 0.4 MG SL tablet Place 1 tablet (0.4 mg total) under the tongue every 5 (five) minutes as needed for chest pain (max 3 doses in 15 minutes). 25 tablet 3  . polyethylene glycol powder (GLYCOLAX/MIRALAX) powder Take 17 g by mouth daily.    . simvastatin (ZOCOR) 10 MG tablet TAKE 1 TABLET AT BEDTIME 90 tablet 3  . valACYclovir (VALTREX) 500 MG tablet Take 1 tablet (500 mg total) by mouth daily. 90 tablet 1  . vitamin B-12 (CYANOCOBALAMIN) 1000 MCG tablet Take 1 tablet (1,000 mcg total) by mouth daily.    [name] allopurinol (ZYLOPRIM) 300 MG tablet Take 1 tablet (300 mg total) by mouth daily. 90 tablet 1   No facility-administered medications prior to visit.     Per HPI unless specifically indicated in ROS section below Review of Systems Objective:    BP 102/62 (BP Location: Right Arm, Patient Position: Sitting, Cuff Size: Normal)   Pulse 89   Temp 98.1 F (36.7 C) (Temporal)   Ht 5\' 9"  (1.753 m)   Wt 158 lb 1.6 oz (71.7 kg)   SpO2 97%  BMI 23.35 kg/m   Wt Readings from Last 3 Encounters:  02/16/19 158 lb (71.7 kg)  02/02/19 158 lb 1.6 oz (71.7 kg)  11/13/18 155 lb (70.3 kg)    Physical Exam Vitals signs and nursing note reviewed.  Constitutional:      General: He is not in acute distress.    Appearance: Normal appearance. He is well-developed. He is ill-appearing (chronic).     Comments: In transport chair  HENT:     Mouth/Throat:     Mouth: Mucous membranes are moist.     Pharynx: Oropharynx is clear. No posterior oropharyngeal erythema.  Abdominal:     General: Abdomen is flat. Bowel sounds are increased.     Palpations: Abdomen is soft. There is no mass.     Tenderness: There is no abdominal tenderness. There is  no guarding or rebound. Negative signs include Murphy's sign.     Hernia: No hernia is present.     Comments: Tight abdomen  Genitourinary:    Rectum: Tenderness present. No anal fissure, external hemorrhoid or internal hemorrhoid. Normal anal tone.     Comments: Large amount of soft stool in rectal vault, fecal disimpaction attempted unsuccessfully Neurological:     Mental Status: He is alert.  Psychiatric:        Mood and Affect: Mood normal.        Behavior: Behavior normal.       Assessment & Plan:   Problem List Items Addressed This Visit    History of stroke with residual deficit    ADDENDUM ==> h/o stroke with residual deficit including L hemiparesis. See below for hospital bed request by family.       Fecal impaction ([place]) - Primary    Anticipate cause of rectal pain and fecal urgency/incontinence. Manual disimpaction attempted in office with poor results. Will increase miralax to 1 packet twice daily for next 3 days and reassess in office at that time. Check abd xray today - large stool throughout.       Relevant Orders   DG Abd 2 Views (Completed)      ADDENDUM==>  Due to prior hemorrhagic and ischemic strokes with residual L hemiparesis patient requires frequent changes in body position.  AND: . The patient has a medical condition which requires positioning of the body in ways not feasible with an ordinary bed (03/24/2019)   No orders of the defined types were placed in this encounter.  Orders Placed This Encounter  Procedures  . DG Abd 2 Views    Standing Status:   Future    Number of Occurrences:   1    Standing Expiration Date:   04/03/2020    Order Specific Question:   Reason for Exam (SYMPTOM  OR DIAGNOSIS REQUIRED)    Answer:   constipation, fecal impaction    Order Specific Question:   Preferred imaging location?    Answer:   [facility]    Order Specific Question:   Radiology Contrast Protocol - do NOT remove file path    Answer:    [email or web address]    Patient Instructions  Xray today I think you have fecal impaction Treat with miralax 1 packet twice daily for the next 3 days, then return for recheck.   Follow up plan: Return in about 3 days (around 02/05/2019) for follow up visit.  Ria Bush, MD

## 2019-02-02 NOTE — Telephone Encounter (Signed)
Spoke to patient and appointment scheduled today at 2:45 pm in office.

## 2019-02-02 NOTE — Assessment & Plan Note (Signed)
Anticipate cause of rectal pain and fecal urgency/incontinence. Manual disimpaction attempted in office with poor results. Will increase miralax to 1 packet twice daily for next 3 days and reassess in office at that time. Check abd xray today - large stool throughout.

## 2019-02-02 NOTE — Patient Instructions (Addendum)
Xray today I think you have fecal impaction Treat with miralax 1 packet twice daily for the next 3 days, then return for recheck.

## 2019-02-03 ENCOUNTER — Telehealth: Payer: Self-pay | Admitting: Family Medicine

## 2019-02-03 NOTE — Telephone Encounter (Signed)
Let's keep appt for now, if doing much better by tomorrow, they can call tomorrow and cancel appointment. Continue BID miralax.

## 2019-02-03 NOTE — Telephone Encounter (Signed)
Patient's wife stated they received results about the xray and spoke with the nurse They would like to know if you would still like to see the patient on 11/6 for that app

## 2019-02-03 NOTE — Telephone Encounter (Signed)
Spoke with pt's wife, [name] (on dpr), relaying Dr. Synthia Innocent message.  Verbalizes understanding.

## 2019-02-03 NOTE — Telephone Encounter (Signed)
error 

## 2019-02-05 ENCOUNTER — Ambulatory Visit: Payer: Medicare Other | Admitting: Family Medicine

## 2019-02-05 NOTE — Telephone Encounter (Signed)
Left message on vm per dpr asking pt/wife, [name], to call back to let us know if he is doing better today.  If so, per Dr. Darnell Level, today's OV can be canceled.  If not, we'll see pt at 2:30.

## 2019-02-05 NOTE — Telephone Encounter (Signed)
Noted.  OV c/x.  Fyi to Dr. Darnell Level.

## 2019-02-05 NOTE — Telephone Encounter (Signed)
Spoke with patient he is feeling fine and does not need to come in today.

## 2019-02-16 ENCOUNTER — Encounter: Payer: Self-pay | Admitting: Physical Medicine & Rehabilitation

## 2019-02-16 ENCOUNTER — Other Ambulatory Visit: Payer: Self-pay

## 2019-02-16 ENCOUNTER — Encounter: Payer: Medicare Other | Attending: Physical Medicine & Rehabilitation | Admitting: Physical Medicine & Rehabilitation

## 2019-02-16 VITALS — BP 115/64 | HR 67 | Temp 98.0°F | Ht 69.0 in | Wt 158.0 lb

## 2019-02-16 DIAGNOSIS — R269 Unspecified abnormalities of gait and mobility: Secondary | ICD-10-CM | POA: Diagnosis not present

## 2019-02-16 DIAGNOSIS — I69954 Hemiplegia and hemiparesis following unspecified cerebrovascular disease affecting left non-dominant side: Secondary | ICD-10-CM

## 2019-02-16 DIAGNOSIS — I69398 Other sequelae of cerebral infarction: Secondary | ICD-10-CM | POA: Diagnosis not present

## 2019-02-16 DIAGNOSIS — I6521 Occlusion and stenosis of right carotid artery: Secondary | ICD-10-CM

## 2019-02-16 NOTE — Progress Notes (Signed)
Subjective:    [name], male    DOB: 1928-08-28, 85 y.o.   MRN: [id number] 85 y.o m who presents for follow up of R lentiform hemorrhagic stroke on 04/04/2018. HPI PT and OT have completed. Not doing HEP, we discussed the importance of continuing this Needs help with standing during dressing  Bowel accidents with Miralax trying to take this more on a as needed basis as stools have been a little bit on the loose side.  His wife needs to clean him up. Pain Inventory Average Pain 0 Pain Right Now 0 My pain is na  In the last 24 hours, has pain interfered with the following? General activity 0 Relation with others 0 Enjoyment of life 0 What TIME of day is your pain at its worst? na Sleep (in general) Good  Pain is worse with: na Pain improves with: na Relief from Meds: na  Mobility use a walker ability to climb steps?  no use a wheelchair  Function retired  Neuro/Psych bladder control problems bowel control problems trouble walking  Prior Studies Any changes since last visit?  no  Physicians involved in your care Any changes since last visit?  no   Family History  Problem Relation Age of Onset  . Hypertension Mother   . Heart disease Mother   . Hyperlipidemia Mother   . Hypertension Father   . Hyperlipidemia Father   . Kidney cancer Sister        Renal cell cancer  . Alcohol abuse Brother   . Diabetes Brother   . Stroke Brother   . Heart attack Brother        MI  . Diabetes Other        Sister's daughter  . Depression Daughter        Bipolar   Social History   Socioeconomic History  . Marital status: Married    Spouse name: Not on file  . Number of children: Not on file  . Years of education: Not on file  . Highest education level: Not on file  Occupational History  . Occupation: Retired Information systems manager: RETIRED  Social Needs  . Financial resource strain: Not hard at all  . Food insecurity    Worry: Never true   Inability: Never true  . Transportation needs    Medical: No    Non-medical: No  Tobacco Use  . Smoking status: Former Smoker    Packs/day: 2.00    Years: 30.00    Pack years: 60.00    Quit date: 02/28/1979    Years since quitting: 39.9  . Smokeless tobacco: Never Used  . Tobacco comment: quit 1980  Substance and Sexual Activity  . Alcohol use: No    Alcohol/week: 0.0 standard drinks    Comment: occasional wine  . Drug use: Never  . Sexual activity: Yes  Lifestyle  . Physical activity    Days per week: 5 days    Minutes per session: 30 min  . Stress: Not at all  Relationships  . Social Herbalist on phone: Not on file    Gets together: Not on file    Attends religious service: Not on file    Active member of club or organization: Not on file    Attends meetings of clubs or organizations: Not on file    Relationship status: Not on file  Other Topics Concern  . Not on file  Social History Narrative  Married and lives with wife   1 son died of lung cancer at 84 years old   34 daughter bipolar, lives    Activity: walks 1 mi daily on treadmill    Diet: good water, some fruits/vegetables    Past Surgical History:  Procedure Laterality Date  . ANGIOPLASTY  1998  . CATARACT EXTRACTION, BILATERAL     R 1/09, L 8/09  . COLONOSCOPY  11/29/1987   Normal  . COLONOSCOPY  02/07/2003   Hemm. Internal, focal proctitis, negative biopsy  . COLONOSCOPY  12/12/2004   Internal external hemorrhoids, +proctits, negative biopsy  . CORONARY ANGIOPLASTY  1998  . CORONARY ARTERY BYPASS GRAFT  04/23/2001   x4, Dr. Pia Mau  . ESOPHAGOGASTRODUODENOSCOPY  11/29/1987   Gastritis  . ETT  12/10/2006   Persantine myoview nml  . HEMORROIDECTOMY  1954   Fissure repair, [place]  . HEPATIC ARTERY ANGIOPLASTY  1954   [place], [place]  . KIDNEY STONE SURGERY  1977   Dr Redmond Baseman  . LITHOTRIPSY  1990s   Multiple  . US ECHOCARDIOGRAPHY  07/2011   nl sys fxn, EF 55-60%, grade 1 diast  dysfunction, mild AS, mildly elevated PA pressure   Past Medical History:  Diagnosis Date  . CAD (coronary artery disease)    a. 04/2001 S/P CABGx 4;  b. 2008 MV:  EF 63% normal perfusion.  . Carotid stenosis    a. 11/2012 Carotid U/S:  RICA [id number], LICA [id number].  . Chronic kidney disease, stage 3   . CLL (chronic lymphoblastic leukemia) 2009   Stage IV; Dr. Ivor Messier - referred to Dr. Lissa Merlin [facility] 07/2013 now on Rituxan (10/2013) --> stage 0 (09/2014)  . Diastolic CHF ([place])    a. [id number] Echo: EF 55-65%, mild conc LVH, Gr 1 DD, mild AS, triv AI, mildly dil Ao root (3.5 cm).  . GERD (gastroesophageal reflux disease) 1990s  . History of CVA (cerebrovascular accident) 2015   by MRI - remote L internal capsule  . History of herpes genitalis    valtrex daily  . Hyperlipemia 2002  . Hypertension 2002  . Mass of submandibular region 2015   referred to gen surg after chemo  . Mild aortic stenosis    a. 07/2011 Echo: Mild AS, triv AI.  [name] Stroke ([place])   . Thrombocytopenia ([place])    outpatient goals Hgb >9, plt >20  . TIA (transient ischemic attack)    04/2016   BP 115/64   Pulse 67   Temp 98 F (36.7 C)   Ht 5\' 9"  (1.753 m)   Wt 158 lb (71.7 kg)   SpO2 95%   BMI 23.33 kg/m   Opioid Risk Score:   Fall Risk Score:  `1  Depression screen PHQ 2/9  Depression screen [facility] 2/9 10/30/2018 06/17/2018 05/29/2018 10/21/2017 10/11/2016 09/20/2015 09/16/2014  Decreased Interest 0 0 0 0 0 0 0  Down, Depressed, Hopeless 0 0 0 0 0 0 0  PHQ - 2 Score 0 0 0 0 0 0 0  Altered sleeping 0 - - 0 - - -  Tired, decreased energy 0 - - 0 - - -  Change in appetite 0 - - 0 - - -  Feeling bad or failure about yourself  0 - - 0 - - -  Trouble concentrating 3 - - 0 - - -  Moving slowly or fidgety/restless 0 - - 0 - - -  Suicidal thoughts 0 - - 0 - - -  PHQ-9  Score 3 - - 0 - - -  Difficult doing work/chores Not difficult at all - - Not difficult at all - - -  Some recent data might be hidden     Review of Systems   Constitutional: Negative.   HENT: Negative.   Eyes: Negative.   Respiratory: Negative.   Cardiovascular: Negative.   Gastrointestinal: Negative.   Endocrine: Negative.   Genitourinary: Positive for difficulty urinating.  Musculoskeletal: Positive for gait problem.  Skin: Negative.   Allergic/Immunologic: Negative.   Hematological: Negative.   Psychiatric/Behavioral: Negative.   All other systems reviewed and are negative.      Objective:   Physical Exam Vitals signs and nursing note reviewed.  Constitutional:      General: He is not in acute distress.    Appearance: Normal appearance.  HENT:     Head: Normocephalic and atraumatic.  Eyes:     Extraocular Movements: Extraocular movements intact.     Conjunctiva/sclera: Conjunctivae normal.     Pupils: Pupils are equal, round, and reactive to light.  Skin:    General: Skin is warm and dry.  Neurological:     Mental Status: He is alert and oriented to person, place, and time.     Comments: Motor strength is 3 - at the left deltoid bicep tricep finger flexors and extensors. 4 at the left hip flexor knee extensor ankle dorsiflexor 5 at the right deltoid by stress of grip 5/5 in the right hip flexor knee extensor ankle dorsiflexor Sensation intact bilaterally to pinprick and light touch in the upper extremities. Speech without dysarthria or aphasia Patient does have some delay in terms of response time.  Psychiatric:        Mood and Affect: Mood normal.        Behavior: Behavior normal.           Assessment & Plan:  #1.  History of lentiform nucleus hemorrhage on the right with chronic left hemiparesis.  He has made good recovery overall.  We discussed the importance of PT OT home exercise program.  We discussed that failure to continue this may result in a loss of functional gains. He does not require a physical medicine rehab follow-up visit. He will need to follow-up with his primary care physician  Discussed with  patient and his wife agree with plan

## 2019-02-18 DIAGNOSIS — H04123 Dry eye syndrome of bilateral lacrimal glands: Secondary | ICD-10-CM | POA: Diagnosis not present

## 2019-02-18 DIAGNOSIS — H524 Presbyopia: Secondary | ICD-10-CM | POA: Diagnosis not present

## 2019-02-18 DIAGNOSIS — Z961 Presence of intraocular lens: Secondary | ICD-10-CM | POA: Diagnosis not present

## 2019-02-23 ENCOUNTER — Other Ambulatory Visit: Payer: Self-pay | Admitting: Family Medicine

## 2019-02-23 NOTE — Telephone Encounter (Signed)
Allopurinol Last filled:  12/14/18, #90 Last OV:  02/02/19, acute fecal impaction Next OV:  05/07/19, 6 mo f/u

## 2019-03-15 DIAGNOSIS — Z79899 Other long term (current) drug therapy: Secondary | ICD-10-CM | POA: Diagnosis not present

## 2019-03-15 DIAGNOSIS — I129 Hypertensive chronic kidney disease with stage 1 through stage 4 chronic kidney disease, or unspecified chronic kidney disease: Secondary | ICD-10-CM | POA: Diagnosis not present

## 2019-03-15 DIAGNOSIS — E538 Deficiency of other specified B group vitamins: Secondary | ICD-10-CM | POA: Diagnosis not present

## 2019-03-15 DIAGNOSIS — I2510 Atherosclerotic heart disease of native coronary artery without angina pectoris: Secondary | ICD-10-CM | POA: Diagnosis not present

## 2019-03-15 DIAGNOSIS — Z85828 Personal history of other malignant neoplasm of skin: Secondary | ICD-10-CM | POA: Diagnosis not present

## 2019-03-15 DIAGNOSIS — Z8673 Personal history of transient ischemic attack (TIA), and cerebral infarction without residual deficits: Secondary | ICD-10-CM | POA: Diagnosis not present

## 2019-03-15 DIAGNOSIS — Z7902 Long term (current) use of antithrombotics/antiplatelets: Secondary | ICD-10-CM | POA: Diagnosis not present

## 2019-03-15 DIAGNOSIS — Z951 Presence of aortocoronary bypass graft: Secondary | ICD-10-CM | POA: Diagnosis not present

## 2019-03-15 DIAGNOSIS — I6782 Cerebral ischemia: Secondary | ICD-10-CM | POA: Diagnosis not present

## 2019-03-15 DIAGNOSIS — N189 Chronic kidney disease, unspecified: Secondary | ICD-10-CM | POA: Diagnosis not present

## 2019-03-15 DIAGNOSIS — E7800 Pure hypercholesterolemia, unspecified: Secondary | ICD-10-CM | POA: Diagnosis not present

## 2019-03-15 DIAGNOSIS — C9110 Chronic lymphocytic leukemia of B-cell type not having achieved remission: Secondary | ICD-10-CM | POA: Diagnosis not present

## 2019-03-15 DIAGNOSIS — E785 Hyperlipidemia, unspecified: Secondary | ICD-10-CM | POA: Diagnosis not present

## 2019-03-22 ENCOUNTER — Telehealth: Payer: Self-pay

## 2019-03-22 DIAGNOSIS — I6930 Unspecified sequelae of cerebral infarction: Secondary | ICD-10-CM

## 2019-03-22 DIAGNOSIS — G8929 Other chronic pain: Secondary | ICD-10-CM

## 2019-03-22 DIAGNOSIS — R29898 Other symptoms and signs involving the musculoskeletal system: Secondary | ICD-10-CM

## 2019-03-22 DIAGNOSIS — M545 Low back pain, unspecified: Secondary | ICD-10-CM

## 2019-03-22 NOTE — Telephone Encounter (Signed)
Jimmy [name] (DPR signed) left v/m that pt needs a hospital bed with railings. Pt last seen 02/02/19.Please advise.

## 2019-03-23 DIAGNOSIS — Z951 Presence of aortocoronary bypass graft: Secondary | ICD-10-CM | POA: Diagnosis not present

## 2019-03-23 DIAGNOSIS — Z79899 Other long term (current) drug therapy: Secondary | ICD-10-CM | POA: Diagnosis not present

## 2019-03-23 DIAGNOSIS — Z7982 Long term (current) use of aspirin: Secondary | ICD-10-CM | POA: Diagnosis not present

## 2019-03-23 DIAGNOSIS — I69354 Hemiplegia and hemiparesis following cerebral infarction affecting left non-dominant side: Secondary | ICD-10-CM | POA: Diagnosis not present

## 2019-03-23 DIAGNOSIS — I69391 Dysphagia following cerebral infarction: Secondary | ICD-10-CM | POA: Diagnosis not present

## 2019-03-23 DIAGNOSIS — I2510 Atherosclerotic heart disease of native coronary artery without angina pectoris: Secondary | ICD-10-CM | POA: Diagnosis not present

## 2019-03-23 DIAGNOSIS — C9111 Chronic lymphocytic leukemia of B-cell type in remission: Secondary | ICD-10-CM | POA: Diagnosis not present

## 2019-03-23 DIAGNOSIS — R1310 Dysphagia, unspecified: Secondary | ICD-10-CM | POA: Diagnosis not present

## 2019-03-23 DIAGNOSIS — I5032 Chronic diastolic (congestive) heart failure: Secondary | ICD-10-CM | POA: Diagnosis not present

## 2019-03-23 DIAGNOSIS — I130 Hypertensive heart and chronic kidney disease with heart failure and stage 1 through stage 4 chronic kidney disease, or unspecified chronic kidney disease: Secondary | ICD-10-CM | POA: Diagnosis not present

## 2019-03-23 DIAGNOSIS — D696 Thrombocytopenia, unspecified: Secondary | ICD-10-CM | POA: Diagnosis not present

## 2019-03-23 DIAGNOSIS — N183 Chronic kidney disease, stage 3 unspecified: Secondary | ICD-10-CM | POA: Diagnosis not present

## 2019-03-23 DIAGNOSIS — Z8679 Personal history of other diseases of the circulatory system: Secondary | ICD-10-CM | POA: Diagnosis not present

## 2019-03-23 NOTE — Addendum Note (Signed)
Addended by: Ria Bush on: 03/23/2019 08:16 AM   Modules accepted: Orders

## 2019-03-23 NOTE — Telephone Encounter (Addendum)
I placed order through epic for home DME.  It printed an order which I placed in Lisa's box.

## 2019-03-23 NOTE — Telephone Encounter (Signed)
Community message sent to [facility]) advising them of order in Epic for hospital bed.

## 2019-03-23 NOTE — Telephone Encounter (Signed)
Per [facility]:  In order for [place] to process this order for the semi electric bed and gel overlay we need the below addended into the nov 3 note.   "Due to (insert DX here) patient requires frequent changes in body position AND/OR has an immediate need for a change in body position"  AND ONE (OR MORE) OF THE FOLLOWING:  . "The patient has a medical condition which requires positioning of the body in ways not feasible with an ordinary bed", or  . "The patient requires positioning of the body in ways not feasible with an ordinary bed in order to alleviate pain",  or . "The patient requires the head of the bed to be elevated more than 30 degrees most of the time due to congestive heart failure, chronic pulmonary disease, or problems with  aspiration",   AND  A gel overlay is required due to:"  Completely immobile OR limited mobility (One of these conditions must be listed)  AND ONE (OR MORE) OF THE FOLLOWING:  . Pressure ulcer on trunk or pelvis  . Impaired nutrition status  . Incontinence  . Altered sensory perception  . Compromised circulatory status   Please note--- Please note Myself, Melissa, and Sonia Baller will be out of the office on Thursday the 24th and Friday the 25th. Any urgent referrals will need to be faxed to [phone].

## 2019-03-24 NOTE — Assessment & Plan Note (Signed)
ADDENDUM ==> h/o stroke with residual deficit including L hemiparesis. See below for hospital bed request by family.

## 2019-03-24 NOTE — Addendum Note (Signed)
Addended by: Ria Bush on: 03/24/2019 08:21 AM   Modules accepted: Orders

## 2019-03-24 NOTE — Telephone Encounter (Signed)
Faxed notes and order to [facility].

## 2019-03-24 NOTE — Telephone Encounter (Addendum)
Latest OV printed and in Lisa's box with requested changes below.  New order for hospital bed placed, without gel overlay.

## 2019-03-30 ENCOUNTER — Telehealth: Payer: Self-pay | Admitting: Family Medicine

## 2019-03-30 DIAGNOSIS — I5032 Chronic diastolic (congestive) heart failure: Secondary | ICD-10-CM | POA: Diagnosis not present

## 2019-03-30 DIAGNOSIS — I69354 Hemiplegia and hemiparesis following cerebral infarction affecting left non-dominant side: Secondary | ICD-10-CM | POA: Diagnosis not present

## 2019-03-30 DIAGNOSIS — I130 Hypertensive heart and chronic kidney disease with heart failure and stage 1 through stage 4 chronic kidney disease, or unspecified chronic kidney disease: Secondary | ICD-10-CM | POA: Diagnosis not present

## 2019-03-30 DIAGNOSIS — C9111 Chronic lymphocytic leukemia of B-cell type in remission: Secondary | ICD-10-CM | POA: Diagnosis not present

## 2019-03-30 DIAGNOSIS — N183 Chronic kidney disease, stage 3 unspecified: Secondary | ICD-10-CM | POA: Diagnosis not present

## 2019-03-30 DIAGNOSIS — I2510 Atherosclerotic heart disease of native coronary artery without angina pectoris: Secondary | ICD-10-CM | POA: Diagnosis not present

## 2019-03-30 NOTE — Telephone Encounter (Signed)
Ok to do this - I believe we previously sent in order for DME through Epic for this.

## 2019-03-30 NOTE — Telephone Encounter (Signed)
Spoke with pt/pt's wife, [name] (on dpr), notifying them the order was sent to [facility].  Says she will contact them to see if they are handling it.  If not, she will call back.

## 2019-03-30 NOTE — Telephone Encounter (Signed)
Patient's wife called She stated that [facility] home care is needing an approval from his primary in order for him to receive a hospital bed from them  [facility]- Phone- [phone]

## 2019-03-31 NOTE — Telephone Encounter (Signed)
Patient's wife called  She would like to know if we have contacted [facility] to see how soon the patient could get his hospital bed

## 2019-03-31 NOTE — Telephone Encounter (Signed)
Spoke with pt/pt's wife, [name] notifying we are working with [facility] to get hospital bed and supplies for the pt.  They should be hearing from [facility] about when to expect items.

## 2019-04-01 DIAGNOSIS — I2510 Atherosclerotic heart disease of native coronary artery without angina pectoris: Secondary | ICD-10-CM | POA: Diagnosis not present

## 2019-04-01 DIAGNOSIS — I130 Hypertensive heart and chronic kidney disease with heart failure and stage 1 through stage 4 chronic kidney disease, or unspecified chronic kidney disease: Secondary | ICD-10-CM | POA: Diagnosis not present

## 2019-04-01 DIAGNOSIS — C9111 Chronic lymphocytic leukemia of B-cell type in remission: Secondary | ICD-10-CM | POA: Diagnosis not present

## 2019-04-01 DIAGNOSIS — I69354 Hemiplegia and hemiparesis following cerebral infarction affecting left non-dominant side: Secondary | ICD-10-CM | POA: Diagnosis not present

## 2019-04-01 DIAGNOSIS — N183 Chronic kidney disease, stage 3 unspecified: Secondary | ICD-10-CM | POA: Diagnosis not present

## 2019-04-01 DIAGNOSIS — I5032 Chronic diastolic (congestive) heart failure: Secondary | ICD-10-CM | POA: Diagnosis not present

## 2019-04-06 DIAGNOSIS — I2510 Atherosclerotic heart disease of native coronary artery without angina pectoris: Secondary | ICD-10-CM | POA: Diagnosis not present

## 2019-04-06 DIAGNOSIS — I5032 Chronic diastolic (congestive) heart failure: Secondary | ICD-10-CM | POA: Diagnosis not present

## 2019-04-06 DIAGNOSIS — I130 Hypertensive heart and chronic kidney disease with heart failure and stage 1 through stage 4 chronic kidney disease, or unspecified chronic kidney disease: Secondary | ICD-10-CM | POA: Diagnosis not present

## 2019-04-06 DIAGNOSIS — C9111 Chronic lymphocytic leukemia of B-cell type in remission: Secondary | ICD-10-CM | POA: Diagnosis not present

## 2019-04-06 DIAGNOSIS — N183 Chronic kidney disease, stage 3 unspecified: Secondary | ICD-10-CM | POA: Diagnosis not present

## 2019-04-06 DIAGNOSIS — I69354 Hemiplegia and hemiparesis following cerebral infarction affecting left non-dominant side: Secondary | ICD-10-CM | POA: Diagnosis not present

## 2019-04-07 DIAGNOSIS — N183 Chronic kidney disease, stage 3 unspecified: Secondary | ICD-10-CM | POA: Diagnosis not present

## 2019-04-07 DIAGNOSIS — C9111 Chronic lymphocytic leukemia of B-cell type in remission: Secondary | ICD-10-CM | POA: Diagnosis not present

## 2019-04-07 DIAGNOSIS — I69354 Hemiplegia and hemiparesis following cerebral infarction affecting left non-dominant side: Secondary | ICD-10-CM | POA: Diagnosis not present

## 2019-04-07 DIAGNOSIS — I2510 Atherosclerotic heart disease of native coronary artery without angina pectoris: Secondary | ICD-10-CM | POA: Diagnosis not present

## 2019-04-07 DIAGNOSIS — I130 Hypertensive heart and chronic kidney disease with heart failure and stage 1 through stage 4 chronic kidney disease, or unspecified chronic kidney disease: Secondary | ICD-10-CM | POA: Diagnosis not present

## 2019-04-07 DIAGNOSIS — I5032 Chronic diastolic (congestive) heart failure: Secondary | ICD-10-CM | POA: Diagnosis not present

## 2019-04-09 DIAGNOSIS — N183 Chronic kidney disease, stage 3 unspecified: Secondary | ICD-10-CM | POA: Diagnosis not present

## 2019-04-09 DIAGNOSIS — I5032 Chronic diastolic (congestive) heart failure: Secondary | ICD-10-CM | POA: Diagnosis not present

## 2019-04-09 DIAGNOSIS — C9111 Chronic lymphocytic leukemia of B-cell type in remission: Secondary | ICD-10-CM | POA: Diagnosis not present

## 2019-04-09 DIAGNOSIS — I69354 Hemiplegia and hemiparesis following cerebral infarction affecting left non-dominant side: Secondary | ICD-10-CM | POA: Diagnosis not present

## 2019-04-09 DIAGNOSIS — I130 Hypertensive heart and chronic kidney disease with heart failure and stage 1 through stage 4 chronic kidney disease, or unspecified chronic kidney disease: Secondary | ICD-10-CM | POA: Diagnosis not present

## 2019-04-09 DIAGNOSIS — I2510 Atherosclerotic heart disease of native coronary artery without angina pectoris: Secondary | ICD-10-CM | POA: Diagnosis not present

## 2019-04-12 DIAGNOSIS — C9111 Chronic lymphocytic leukemia of B-cell type in remission: Secondary | ICD-10-CM | POA: Diagnosis not present

## 2019-04-12 DIAGNOSIS — I5032 Chronic diastolic (congestive) heart failure: Secondary | ICD-10-CM | POA: Diagnosis not present

## 2019-04-12 DIAGNOSIS — I130 Hypertensive heart and chronic kidney disease with heart failure and stage 1 through stage 4 chronic kidney disease, or unspecified chronic kidney disease: Secondary | ICD-10-CM | POA: Diagnosis not present

## 2019-04-12 DIAGNOSIS — I2510 Atherosclerotic heart disease of native coronary artery without angina pectoris: Secondary | ICD-10-CM | POA: Diagnosis not present

## 2019-04-12 DIAGNOSIS — I69354 Hemiplegia and hemiparesis following cerebral infarction affecting left non-dominant side: Secondary | ICD-10-CM | POA: Diagnosis not present

## 2019-04-12 DIAGNOSIS — N183 Chronic kidney disease, stage 3 unspecified: Secondary | ICD-10-CM | POA: Diagnosis not present

## 2019-04-13 DIAGNOSIS — I2510 Atherosclerotic heart disease of native coronary artery without angina pectoris: Secondary | ICD-10-CM | POA: Diagnosis not present

## 2019-04-13 DIAGNOSIS — Z79899 Other long term (current) drug therapy: Secondary | ICD-10-CM | POA: Diagnosis not present

## 2019-04-13 DIAGNOSIS — I130 Hypertensive heart and chronic kidney disease with heart failure and stage 1 through stage 4 chronic kidney disease, or unspecified chronic kidney disease: Secondary | ICD-10-CM | POA: Diagnosis not present

## 2019-04-13 DIAGNOSIS — I5032 Chronic diastolic (congestive) heart failure: Secondary | ICD-10-CM | POA: Diagnosis not present

## 2019-04-13 DIAGNOSIS — C9111 Chronic lymphocytic leukemia of B-cell type in remission: Secondary | ICD-10-CM | POA: Diagnosis not present

## 2019-04-13 DIAGNOSIS — I69354 Hemiplegia and hemiparesis following cerebral infarction affecting left non-dominant side: Secondary | ICD-10-CM | POA: Diagnosis not present

## 2019-04-13 DIAGNOSIS — N183 Chronic kidney disease, stage 3 unspecified: Secondary | ICD-10-CM | POA: Diagnosis not present

## 2019-04-14 ENCOUNTER — Telehealth: Payer: Self-pay | Admitting: Family Medicine

## 2019-04-14 DIAGNOSIS — I69354 Hemiplegia and hemiparesis following cerebral infarction affecting left non-dominant side: Secondary | ICD-10-CM | POA: Diagnosis not present

## 2019-04-14 DIAGNOSIS — I2510 Atherosclerotic heart disease of native coronary artery without angina pectoris: Secondary | ICD-10-CM | POA: Diagnosis not present

## 2019-04-14 DIAGNOSIS — C9111 Chronic lymphocytic leukemia of B-cell type in remission: Secondary | ICD-10-CM | POA: Diagnosis not present

## 2019-04-14 DIAGNOSIS — I130 Hypertensive heart and chronic kidney disease with heart failure and stage 1 through stage 4 chronic kidney disease, or unspecified chronic kidney disease: Secondary | ICD-10-CM | POA: Diagnosis not present

## 2019-04-14 DIAGNOSIS — I5032 Chronic diastolic (congestive) heart failure: Secondary | ICD-10-CM | POA: Diagnosis not present

## 2019-04-14 DIAGNOSIS — N183 Chronic kidney disease, stage 3 unspecified: Secondary | ICD-10-CM | POA: Diagnosis not present

## 2019-04-14 NOTE — Telephone Encounter (Signed)
Elta Guadeloupe, Physical Therapist from [facility] home [facility] went out today to see the patient . He stated that the patient fell yesterday. He was in his chair going out the back door to head to the hospital for a blood draw and fell.  Patient's Wife was able to help him up, no injuries. Just bursing on left arm    [facility]- [phone]

## 2019-04-14 NOTE — Telephone Encounter (Signed)
Noted  On aspirin 325mg  for h/o strokes.  Will continue at this time.

## 2019-04-15 DIAGNOSIS — Z79899 Other long term (current) drug therapy: Secondary | ICD-10-CM | POA: Diagnosis not present

## 2019-04-15 DIAGNOSIS — I5032 Chronic diastolic (congestive) heart failure: Secondary | ICD-10-CM | POA: Diagnosis not present

## 2019-04-15 DIAGNOSIS — I69354 Hemiplegia and hemiparesis following cerebral infarction affecting left non-dominant side: Secondary | ICD-10-CM | POA: Diagnosis not present

## 2019-04-15 DIAGNOSIS — C9111 Chronic lymphocytic leukemia of B-cell type in remission: Secondary | ICD-10-CM | POA: Diagnosis not present

## 2019-04-15 DIAGNOSIS — I2510 Atherosclerotic heart disease of native coronary artery without angina pectoris: Secondary | ICD-10-CM | POA: Diagnosis not present

## 2019-04-15 DIAGNOSIS — I130 Hypertensive heart and chronic kidney disease with heart failure and stage 1 through stage 4 chronic kidney disease, or unspecified chronic kidney disease: Secondary | ICD-10-CM | POA: Diagnosis not present

## 2019-04-15 DIAGNOSIS — N183 Chronic kidney disease, stage 3 unspecified: Secondary | ICD-10-CM | POA: Diagnosis not present

## 2019-04-16 DIAGNOSIS — I5032 Chronic diastolic (congestive) heart failure: Secondary | ICD-10-CM | POA: Diagnosis not present

## 2019-04-16 DIAGNOSIS — I130 Hypertensive heart and chronic kidney disease with heart failure and stage 1 through stage 4 chronic kidney disease, or unspecified chronic kidney disease: Secondary | ICD-10-CM | POA: Diagnosis not present

## 2019-04-16 DIAGNOSIS — I69354 Hemiplegia and hemiparesis following cerebral infarction affecting left non-dominant side: Secondary | ICD-10-CM | POA: Diagnosis not present

## 2019-04-16 DIAGNOSIS — I2510 Atherosclerotic heart disease of native coronary artery without angina pectoris: Secondary | ICD-10-CM | POA: Diagnosis not present

## 2019-04-16 DIAGNOSIS — N183 Chronic kidney disease, stage 3 unspecified: Secondary | ICD-10-CM | POA: Diagnosis not present

## 2019-04-16 DIAGNOSIS — C9111 Chronic lymphocytic leukemia of B-cell type in remission: Secondary | ICD-10-CM | POA: Diagnosis not present

## 2019-04-19 NOTE — Progress Notes (Signed)
Cardiology Office Note    Date:  04/21/2019   ID:  Jimmy [name], DOB Aug 15, 1928, MRN [id number]  PCP:  Ria Bush, MD  Cardiologist:  Ida Rogue, MD  Electrophysiologist:  None   Chief Complaint: Follow-up  History of Present Illness:   Jimmy [name] is a 86 y.o. male with history of CAD status post four-vessel CABG in 04/2001, HFpEF, CLL, TIA initially on Plavix though changed to Aggrenox following recurrent symptoms, stroke noted on imaging in 2018 with hemorrhagic CVA in 04/2018 in the setting of hypertensive crisis as well as Aggrenox with recurrent ischemic CVA in 07/2018 with residual left-sided hemiparesis, dysphagia, and dysarthria along with gait disturbance, mild to moderate carotid artery disease, HTN, HLD, and prior tobacco use who presents for follow-up of his CAD.  Nuclear stress test in 11/2016 showed no evidence of ischemia or infarct with an EF of 63%.  Most recent nuclear stress test in 10/2013 showed no significant ischemia with EF greater than 55%.  Echo in 04/2016 showed normal LV systolic function with severe focal basal hypertrophy, no regional wall motion normalities, grade 1 diastolic dysfunction, severe diffuse thickening and calcification involving the right coronary cusp and noncoronary cusp of the aortic valve with very mild aortic stenosis and mild aortic regurgitation, trivial TR/PR.  Outpatient cardiac monitoring in 07/2016 showed normal sinus rhythm with no significant arrhythmia noted.  He was most recently seen virtually in early 07/2018 noted continued left-sided deficits.  He has been working with PT following his recurrent CVAs.  More recently, he was admitted to the hospital in 07/2018 with ischemic infarct involving the subcortical left frontal lobe with noted multifocal chronic infarcts involving the bilateral basal ganglia, left thalamus, and pons.  MRA head was negative for large vessel occlusion or hemodynamically significant or correctable  stenosis.  Carotid artery ultrasound showed 50 to 69% RICA stenosis with less than A999333 LICA stenosis with vertebral arteries demonstrating antegrade flow bilaterally.  Echo during that admission showed an EF of 60 to [id number], diastolic dysfunction, normal RV systolic function and cavity size, degenerative mitral valve, mild aortic regurgitation with no stenosis noted, normal size aortic root.  Following this, he was seen in the ED in 08/2018 with weakness and AMS of uncertain etiology.  CT head was negative for acute intracranial abnormality.  BP was soft in the 90s over 50s.  He comes in doing well from a cardiac perspective.  He denies any chest pain, shortness of breath, palpitations, dizziness, presyncope, syncope.  No lower extremity swelling, abdominal distention, orthopnea, PND, or early satiety.  He continues to deal with dysphagia, dysarthria, and left-sided hemiparesis stemming from his multiple CVAs.  He is tolerating full dose aspirin without issues.  No falls, hematochezia, or melena.  Blood pressure overall remains well controlled with patient's wife reporting 3 isolated readings in the 160s since 07/2018.  Thus far, he has been unable to procure an appointment for his COVID-19 vaccine.   Labs independently reviewed: 04/2019 - Hgb 15.0, PLT 177, potassium 3.8, BUN 28, serum creatinine 1.2, AST/ALT normal, albumin 4.0 09/2018 - TSH normal 08/2018 - magnesium 2.3 07/2018 - TC 118, TG 135, HDL 35, LDL 56, A1c 5.6  Past Medical History:  Diagnosis Date  . CAD (coronary artery disease)    a. 04/2001 S/P CABGx 4;  b. 2008 MV:  EF 63% normal perfusion.  . Carotid stenosis    a. 11/2012 Carotid U/S:  RICA A999333, LICA 123456.  . Chronic kidney disease,  stage 3   . CLL (chronic lymphoblastic leukemia) 2009   Stage IV; Dr. Ivor Messier - referred to Dr. Lissa Merlin [facility] 07/2013 now on Rituxan (10/2013) --> stage 0 (09/2014)  . Diastolic CHF ([place])    a. [id number] Echo: EF 55-65%, mild conc LVH, Gr 1 DD, mild AS, triv  AI, mildly dil Ao root (3.5 cm).  . GERD (gastroesophageal reflux disease) 1990s  . History of CVA (cerebrovascular accident) 2015   by MRI - remote L internal capsule  . History of herpes genitalis    valtrex daily  . Hyperlipemia 2002  . Hypertension 2002  . Mass of submandibular region 2015   referred to gen surg after chemo  . Mild aortic stenosis    a. 07/2011 Echo: Mild AS, triv AI.  [name] Stroke ([place])   . Thrombocytopenia ([place])    outpatient goals Hgb >9, plt >20  . TIA (transient ischemic attack)    04/2016    Past Surgical History:  Procedure Laterality Date  . ANGIOPLASTY  1998  . CATARACT EXTRACTION, BILATERAL     R 1/09, L 8/09  . COLONOSCOPY  11/29/1987   Normal  . COLONOSCOPY  02/07/2003   Hemm. Internal, focal proctitis, negative biopsy  . COLONOSCOPY  12/12/2004   Internal external hemorrhoids, +proctits, negative biopsy  . CORONARY ANGIOPLASTY  1998  . CORONARY ARTERY BYPASS GRAFT  04/23/2001   x4, Dr. Pia Mau  . ESOPHAGOGASTRODUODENOSCOPY  11/29/1987   Gastritis  . ETT  12/10/2006   Persantine myoview nml  . HEMORROIDECTOMY  1954   Fissure repair, [place]  . HEPATIC ARTERY ANGIOPLASTY  1954   [place], [place]  . KIDNEY STONE SURGERY  1977   Dr Redmond Baseman  . LITHOTRIPSY  1990s   Multiple  . US ECHOCARDIOGRAPHY  07/2011   nl sys fxn, EF 55-60%, grade 1 diast dysfunction, mild AS, mildly elevated PA pressure    Current Medications: No outpatient medications have been marked as taking for the 04/21/19 encounter (Office Visit) with Rise Mu, PA-C.    Allergies:   New skin and Tape   Social History   Socioeconomic History  . Marital status: Married    Spouse name: Not on file  . Number of children: Not on file  . Years of education: Not on file  . Highest education level: Not on file  Occupational History  . Occupation: Retired Information systems manager: RETIRED  Tobacco Use  . Smoking status: Former Smoker    Packs/day: 2.00    Years: 30.00    Pack  years: 60.00    Quit date: 02/28/1979    Years since quitting: 40.1  . Smokeless tobacco: Never Used  . Tobacco comment: quit 1980  Substance and Sexual Activity  . Alcohol use: No    Alcohol/week: 0.0 standard drinks    Comment: occasional wine  . Drug use: Never  . Sexual activity: Yes  Other Topics Concern  . Not on file  Social History Narrative   Married and lives with wife   1 son died of lung cancer at 32 years old   51 daughter bipolar, lives    Activity: walks 1 mi daily on treadmill    Diet: good water, some fruits/vegetables    Social Determinants of Health   Financial Resource Strain: Low Risk   . Difficulty of Paying Living Expenses: Not hard at all  Food Insecurity: No Food Insecurity  . Worried About Charity fundraiser in the  Last Year: Never true  . Ran Out of Food in the Last Year: Never true  Transportation Needs: No Transportation Needs  . Lack of Transportation (Medical): No  . Lack of Transportation (Non-Medical): No  Physical Activity: Sufficiently Active  . Days of Exercise per Week: 5 days  . Minutes of Exercise per Session: 30 min  Stress: No Stress Concern Present  . Feeling of Stress : Not at all  Social Connections:   . Frequency of Communication with Friends and Family: Not on file  . Frequency of Social Gatherings with Friends and Family: Not on file  . Attends Religious Services: Not on file  . Active Member of Clubs or Organizations: Not on file  . Attends Archivist Meetings: Not on file  . Marital Status: Not on file     Family History:  The patient's family history includes Alcohol abuse in his brother; Depression in his daughter; Diabetes in his brother and another family member; Heart attack in his brother; Heart disease in his mother; Hyperlipidemia in his father and mother; Hypertension in his father and mother; Kidney cancer in his sister; Stroke in his brother.  ROS:   Review of Systems  Constitutional: Positive for  malaise/fatigue. Negative for chills, diaphoresis, fever and weight loss.  HENT: Negative for congestion.   Eyes: Negative for discharge and redness.  Respiratory: Negative for cough, hemoptysis, sputum production, shortness of breath and wheezing.   Cardiovascular: Negative for chest pain, palpitations, orthopnea, claudication, leg swelling and PND.  Gastrointestinal: Negative for abdominal pain, blood in stool, heartburn, melena, nausea and vomiting.       Dysphagia  Genitourinary: Negative for hematuria.  Musculoskeletal: Negative for falls and myalgias.  Skin: Negative for rash.  Neurological: Positive for focal weakness and weakness. Negative for dizziness, tingling, tremors, sensory change, speech change and loss of consciousness.       Dysarthria Left-sided hemiparesis  Endo/Heme/Allergies: Does not bruise/bleed easily.  Psychiatric/Behavioral: Negative for substance abuse. The patient is not nervous/anxious.   All other systems reviewed and are negative.    EKGs/Labs/Other Studies Reviewed:    Studies reviewed were summarized above. The additional studies were reviewed today: As above.   EKG:  EKG is ordered today.  The EKG ordered today demonstrates NSR, 76 bpm, poor R wave progression along the precordial leads, prior inferior infarct, no acute ST-T changes, largely unchanged from prior study  Recent Labs: 09/02/2018: Magnesium 2.3 09/09/2018: Hemoglobin 15.7; Platelets 176 10/27/2018: ALT 13; BUN 22; Creatinine, Ser 1.25; Potassium 4.1; Sodium 141; TSH 4.33  Recent Lipid Panel    Component Value Date/Time   CHOL 118 08/28/2018 0345   TRIG 135 08/28/2018 0345   HDL 35 (L) 08/28/2018 0345   CHOLHDL 3.4 08/28/2018 0345   VLDL 27 08/28/2018 0345   LDLCALC 56 08/28/2018 0345    PHYSICAL EXAM:    VS:  BP 110/68 (BP Location: Right Arm, Patient Position: Sitting, Cuff Size: Normal)   Pulse 76   Ht 5\' 9"  (1.753 m)   Wt 156 lb (70.8 kg)   BMI 23.04 kg/m   BMI: Body mass  index is 23.04 kg/m.  Physical Exam  Constitutional: He is oriented to person, place, and time. He appears well-developed and well-nourished.  HENT:  Head: Normocephalic and atraumatic.  Eyes: Right eye exhibits no discharge. Left eye exhibits no discharge.  Neck: No JVD present.  Cardiovascular: Normal rate, regular rhythm, S1 normal, S2 normal and normal heart sounds. Exam reveals no distant  heart sounds, no friction rub, no midsystolic click and no opening snap.  No murmur heard. Pulses:      Posterior tibial pulses are 2+ on the right side and 2+ on the left side.  Pulmonary/Chest: Effort normal and breath sounds normal. No respiratory distress. He has no decreased breath sounds. He has no wheezes. He has no rales. He exhibits no tenderness.  Abdominal: Soft. He exhibits no distension. There is no abdominal tenderness.  Musculoskeletal:        General: No edema.     Cervical back: Normal range of motion.  Neurological: He is alert and oriented to person, place, and time.  Skin: Skin is warm and dry. No cyanosis. Nails show no clubbing.  Psychiatric: He has a normal mood and affect. His speech is normal and behavior is normal. Judgment and thought content normal.    Wt Readings from Last 3 Encounters:  04/21/19 156 lb (70.8 kg)  02/16/19 158 lb (71.7 kg)  02/02/19 158 lb 1.6 oz (71.7 kg)     ASSESSMENT & PLAN:   1. CAD status post CABG without angina: He is doing well without any symptoms concerning for angina.  Patient is on full dose aspirin given recurrent strokes as outlined below per neurology.  Continue lisinopril and simvastatin as outlined below.  No plans for ischemic evaluation at this time.  2. Carotid artery disease: Slight progression of RICA disease on most recent ultrasound from 07/2018 with 50 to 69% stenosis which had progressed from 40 to 59% stenosis in 04/2018.  Less than A999333 LICA stenosis in [id number].  Recommend follow-up carotid artery ultrasound in 07/2019.   Remains on aspirin and statin as outlined below.  3. Recurrent ischemic CVA with history of hemorrhagic stroke: With residual left-sided hemiparesis, dysphagia, and dysarthria.  Remains on full dose aspirin and simvastatin per neurology.  Has been referred to home PT by neurology, follow-up as directed.  Optimal blood pressure and lipid control recommended as outlined below.  4. HTN: Blood pressure is well controlled.  Remains on lisinopril with stable renal function on recent check this month.  5. HLD: LDL of 56 from 07/2018 with normal liver function in 04/2019.  Remains on simvastatin.  6. COVID-19 pandemic: He has struggled with attempts and getting scheduled for his initial vaccine.  We have gotten him set up with the [facility] waiting list.   Disposition: F/u with Dr. Rockey Situ or an APP in 6 months, following carotid artery ultrasound.   Medication Adjustments/Labs and Tests Ordered: Current medicines are reviewed at length with the patient today.  Concerns regarding medicines are outlined above. Medication changes, Labs and Tests ordered today are summarized above and listed in the Patient Instructions accessible in Encounters.   Signed, Christell Faith, PA-C 04/21/2019 3:13 PM     [address], [place] [address] [phone]

## 2019-04-20 DIAGNOSIS — I5032 Chronic diastolic (congestive) heart failure: Secondary | ICD-10-CM | POA: Diagnosis not present

## 2019-04-20 DIAGNOSIS — C9111 Chronic lymphocytic leukemia of B-cell type in remission: Secondary | ICD-10-CM | POA: Diagnosis not present

## 2019-04-20 DIAGNOSIS — I69354 Hemiplegia and hemiparesis following cerebral infarction affecting left non-dominant side: Secondary | ICD-10-CM | POA: Diagnosis not present

## 2019-04-20 DIAGNOSIS — I2510 Atherosclerotic heart disease of native coronary artery without angina pectoris: Secondary | ICD-10-CM | POA: Diagnosis not present

## 2019-04-20 DIAGNOSIS — I130 Hypertensive heart and chronic kidney disease with heart failure and stage 1 through stage 4 chronic kidney disease, or unspecified chronic kidney disease: Secondary | ICD-10-CM | POA: Diagnosis not present

## 2019-04-20 DIAGNOSIS — N183 Chronic kidney disease, stage 3 unspecified: Secondary | ICD-10-CM | POA: Diagnosis not present

## 2019-04-21 ENCOUNTER — Ambulatory Visit (INDEPENDENT_AMBULATORY_CARE_PROVIDER_SITE_OTHER): Payer: Medicare Other | Admitting: Physician Assistant

## 2019-04-21 ENCOUNTER — Other Ambulatory Visit: Payer: Self-pay

## 2019-04-21 ENCOUNTER — Encounter: Payer: Self-pay | Admitting: Physician Assistant

## 2019-04-21 VITALS — BP 110/68 | HR 76 | Ht 69.0 in | Wt 156.0 lb

## 2019-04-21 DIAGNOSIS — E785 Hyperlipidemia, unspecified: Secondary | ICD-10-CM | POA: Diagnosis not present

## 2019-04-21 DIAGNOSIS — N183 Chronic kidney disease, stage 3 unspecified: Secondary | ICD-10-CM | POA: Diagnosis not present

## 2019-04-21 DIAGNOSIS — I619 Nontraumatic intracerebral hemorrhage, unspecified: Secondary | ICD-10-CM

## 2019-04-21 DIAGNOSIS — I639 Cerebral infarction, unspecified: Secondary | ICD-10-CM

## 2019-04-21 DIAGNOSIS — I6521 Occlusion and stenosis of right carotid artery: Secondary | ICD-10-CM | POA: Diagnosis not present

## 2019-04-21 DIAGNOSIS — I2510 Atherosclerotic heart disease of native coronary artery without angina pectoris: Secondary | ICD-10-CM

## 2019-04-21 DIAGNOSIS — I130 Hypertensive heart and chronic kidney disease with heart failure and stage 1 through stage 4 chronic kidney disease, or unspecified chronic kidney disease: Secondary | ICD-10-CM | POA: Diagnosis not present

## 2019-04-21 DIAGNOSIS — I69354 Hemiplegia and hemiparesis following cerebral infarction affecting left non-dominant side: Secondary | ICD-10-CM | POA: Diagnosis not present

## 2019-04-21 DIAGNOSIS — Z951 Presence of aortocoronary bypass graft: Secondary | ICD-10-CM | POA: Diagnosis not present

## 2019-04-21 DIAGNOSIS — I10 Essential (primary) hypertension: Secondary | ICD-10-CM

## 2019-04-21 DIAGNOSIS — I5032 Chronic diastolic (congestive) heart failure: Secondary | ICD-10-CM | POA: Diagnosis not present

## 2019-04-21 DIAGNOSIS — C9111 Chronic lymphocytic leukemia of B-cell type in remission: Secondary | ICD-10-CM | POA: Diagnosis not present

## 2019-04-21 NOTE — Patient Instructions (Signed)
Medication Instructions:  Your physician recommends that you continue on your current medications as directed. Please refer to the Current Medication list given to you today.  *If you need a refill on your cardiac medications before your next appointment, please call your pharmacy*  Lab Work: None ordered  If you have labs (blood work) drawn today and your tests are completely normal, you will receive your results only by: [name] MyChart Message (if you have MyChart) OR . A paper copy in the mail If you have any lab test that is abnormal or we need to change your treatment, we will call you to review the results.  Testing/Procedures: (May 2021) 1- Your physician has requested that you have a carotid duplex. This test is an ultrasound of the carotid arteries in your neck. It looks at blood flow through these arteries that supply the brain with blood. Allow one hour for this exam. There are no restrictions or special instructions.    Follow-Up: At [facility], you and your health needs are our priority.  As part of our continuing mission to provide you with exceptional heart care, we have created designated Provider Care Teams.  These Care Teams include your primary Cardiologist (physician) and Advanced Practice Providers (APPs -  Physician Assistants and Nurse Practitioners) who all work together to provide you with the care you need, when you need it.  Your next appointment:   6 month(s)  The format for your next appointment:   In Person  Provider:    You may see Ida Rogue, MD or Christell Faith, PA-C.

## 2019-04-22 DIAGNOSIS — R1310 Dysphagia, unspecified: Secondary | ICD-10-CM | POA: Diagnosis not present

## 2019-04-22 DIAGNOSIS — C9111 Chronic lymphocytic leukemia of B-cell type in remission: Secondary | ICD-10-CM | POA: Diagnosis not present

## 2019-04-22 DIAGNOSIS — I2510 Atherosclerotic heart disease of native coronary artery without angina pectoris: Secondary | ICD-10-CM | POA: Diagnosis not present

## 2019-04-22 DIAGNOSIS — I69354 Hemiplegia and hemiparesis following cerebral infarction affecting left non-dominant side: Secondary | ICD-10-CM | POA: Diagnosis not present

## 2019-04-22 DIAGNOSIS — Z8679 Personal history of other diseases of the circulatory system: Secondary | ICD-10-CM | POA: Diagnosis not present

## 2019-04-22 DIAGNOSIS — I130 Hypertensive heart and chronic kidney disease with heart failure and stage 1 through stage 4 chronic kidney disease, or unspecified chronic kidney disease: Secondary | ICD-10-CM | POA: Diagnosis not present

## 2019-04-22 DIAGNOSIS — Z79899 Other long term (current) drug therapy: Secondary | ICD-10-CM | POA: Diagnosis not present

## 2019-04-22 DIAGNOSIS — D696 Thrombocytopenia, unspecified: Secondary | ICD-10-CM | POA: Diagnosis not present

## 2019-04-22 DIAGNOSIS — Z7982 Long term (current) use of aspirin: Secondary | ICD-10-CM | POA: Diagnosis not present

## 2019-04-22 DIAGNOSIS — Z951 Presence of aortocoronary bypass graft: Secondary | ICD-10-CM | POA: Diagnosis not present

## 2019-04-22 DIAGNOSIS — N183 Chronic kidney disease, stage 3 unspecified: Secondary | ICD-10-CM | POA: Diagnosis not present

## 2019-04-22 DIAGNOSIS — I69391 Dysphagia following cerebral infarction: Secondary | ICD-10-CM | POA: Diagnosis not present

## 2019-04-22 DIAGNOSIS — I5032 Chronic diastolic (congestive) heart failure: Secondary | ICD-10-CM | POA: Diagnosis not present

## 2019-04-23 DIAGNOSIS — N183 Chronic kidney disease, stage 3 unspecified: Secondary | ICD-10-CM | POA: Diagnosis not present

## 2019-04-23 DIAGNOSIS — I5032 Chronic diastolic (congestive) heart failure: Secondary | ICD-10-CM | POA: Diagnosis not present

## 2019-04-23 DIAGNOSIS — C9111 Chronic lymphocytic leukemia of B-cell type in remission: Secondary | ICD-10-CM | POA: Diagnosis not present

## 2019-04-23 DIAGNOSIS — I2510 Atherosclerotic heart disease of native coronary artery without angina pectoris: Secondary | ICD-10-CM | POA: Diagnosis not present

## 2019-04-23 DIAGNOSIS — I69354 Hemiplegia and hemiparesis following cerebral infarction affecting left non-dominant side: Secondary | ICD-10-CM | POA: Diagnosis not present

## 2019-04-23 DIAGNOSIS — I130 Hypertensive heart and chronic kidney disease with heart failure and stage 1 through stage 4 chronic kidney disease, or unspecified chronic kidney disease: Secondary | ICD-10-CM | POA: Diagnosis not present

## 2019-04-24 DIAGNOSIS — I130 Hypertensive heart and chronic kidney disease with heart failure and stage 1 through stage 4 chronic kidney disease, or unspecified chronic kidney disease: Secondary | ICD-10-CM | POA: Diagnosis not present

## 2019-04-24 DIAGNOSIS — I69354 Hemiplegia and hemiparesis following cerebral infarction affecting left non-dominant side: Secondary | ICD-10-CM | POA: Diagnosis not present

## 2019-04-24 DIAGNOSIS — I2510 Atherosclerotic heart disease of native coronary artery without angina pectoris: Secondary | ICD-10-CM | POA: Diagnosis not present

## 2019-04-24 DIAGNOSIS — C9111 Chronic lymphocytic leukemia of B-cell type in remission: Secondary | ICD-10-CM | POA: Diagnosis not present

## 2019-04-24 DIAGNOSIS — N183 Chronic kidney disease, stage 3 unspecified: Secondary | ICD-10-CM | POA: Diagnosis not present

## 2019-04-24 DIAGNOSIS — I5032 Chronic diastolic (congestive) heart failure: Secondary | ICD-10-CM | POA: Diagnosis not present

## 2019-04-26 DIAGNOSIS — I69354 Hemiplegia and hemiparesis following cerebral infarction affecting left non-dominant side: Secondary | ICD-10-CM | POA: Diagnosis not present

## 2019-04-26 DIAGNOSIS — I5032 Chronic diastolic (congestive) heart failure: Secondary | ICD-10-CM | POA: Diagnosis not present

## 2019-04-26 DIAGNOSIS — N183 Chronic kidney disease, stage 3 unspecified: Secondary | ICD-10-CM | POA: Diagnosis not present

## 2019-04-26 DIAGNOSIS — I2510 Atherosclerotic heart disease of native coronary artery without angina pectoris: Secondary | ICD-10-CM | POA: Diagnosis not present

## 2019-04-26 DIAGNOSIS — I130 Hypertensive heart and chronic kidney disease with heart failure and stage 1 through stage 4 chronic kidney disease, or unspecified chronic kidney disease: Secondary | ICD-10-CM | POA: Diagnosis not present

## 2019-04-26 DIAGNOSIS — C9111 Chronic lymphocytic leukemia of B-cell type in remission: Secondary | ICD-10-CM | POA: Diagnosis not present

## 2019-04-27 DIAGNOSIS — C9111 Chronic lymphocytic leukemia of B-cell type in remission: Secondary | ICD-10-CM | POA: Diagnosis not present

## 2019-04-27 DIAGNOSIS — I69354 Hemiplegia and hemiparesis following cerebral infarction affecting left non-dominant side: Secondary | ICD-10-CM | POA: Diagnosis not present

## 2019-04-27 DIAGNOSIS — N183 Chronic kidney disease, stage 3 unspecified: Secondary | ICD-10-CM | POA: Diagnosis not present

## 2019-04-27 DIAGNOSIS — I130 Hypertensive heart and chronic kidney disease with heart failure and stage 1 through stage 4 chronic kidney disease, or unspecified chronic kidney disease: Secondary | ICD-10-CM | POA: Diagnosis not present

## 2019-04-27 DIAGNOSIS — I2510 Atherosclerotic heart disease of native coronary artery without angina pectoris: Secondary | ICD-10-CM | POA: Diagnosis not present

## 2019-04-27 DIAGNOSIS — I5032 Chronic diastolic (congestive) heart failure: Secondary | ICD-10-CM | POA: Diagnosis not present

## 2019-04-28 DIAGNOSIS — I5032 Chronic diastolic (congestive) heart failure: Secondary | ICD-10-CM | POA: Diagnosis not present

## 2019-04-28 DIAGNOSIS — I2510 Atherosclerotic heart disease of native coronary artery without angina pectoris: Secondary | ICD-10-CM | POA: Diagnosis not present

## 2019-04-28 DIAGNOSIS — C9111 Chronic lymphocytic leukemia of B-cell type in remission: Secondary | ICD-10-CM | POA: Diagnosis not present

## 2019-04-28 DIAGNOSIS — I69354 Hemiplegia and hemiparesis following cerebral infarction affecting left non-dominant side: Secondary | ICD-10-CM | POA: Diagnosis not present

## 2019-04-28 DIAGNOSIS — I130 Hypertensive heart and chronic kidney disease with heart failure and stage 1 through stage 4 chronic kidney disease, or unspecified chronic kidney disease: Secondary | ICD-10-CM | POA: Diagnosis not present

## 2019-04-28 DIAGNOSIS — N183 Chronic kidney disease, stage 3 unspecified: Secondary | ICD-10-CM | POA: Diagnosis not present

## 2019-04-29 DIAGNOSIS — I5032 Chronic diastolic (congestive) heart failure: Secondary | ICD-10-CM | POA: Diagnosis not present

## 2019-04-29 DIAGNOSIS — I2510 Atherosclerotic heart disease of native coronary artery without angina pectoris: Secondary | ICD-10-CM | POA: Diagnosis not present

## 2019-04-29 DIAGNOSIS — C9111 Chronic lymphocytic leukemia of B-cell type in remission: Secondary | ICD-10-CM | POA: Diagnosis not present

## 2019-04-29 DIAGNOSIS — I130 Hypertensive heart and chronic kidney disease with heart failure and stage 1 through stage 4 chronic kidney disease, or unspecified chronic kidney disease: Secondary | ICD-10-CM | POA: Diagnosis not present

## 2019-04-29 DIAGNOSIS — N183 Chronic kidney disease, stage 3 unspecified: Secondary | ICD-10-CM | POA: Diagnosis not present

## 2019-04-29 DIAGNOSIS — I69354 Hemiplegia and hemiparesis following cerebral infarction affecting left non-dominant side: Secondary | ICD-10-CM | POA: Diagnosis not present

## 2019-05-03 DIAGNOSIS — I130 Hypertensive heart and chronic kidney disease with heart failure and stage 1 through stage 4 chronic kidney disease, or unspecified chronic kidney disease: Secondary | ICD-10-CM | POA: Diagnosis not present

## 2019-05-03 DIAGNOSIS — I69354 Hemiplegia and hemiparesis following cerebral infarction affecting left non-dominant side: Secondary | ICD-10-CM | POA: Diagnosis not present

## 2019-05-03 DIAGNOSIS — I5032 Chronic diastolic (congestive) heart failure: Secondary | ICD-10-CM | POA: Diagnosis not present

## 2019-05-03 DIAGNOSIS — N183 Chronic kidney disease, stage 3 unspecified: Secondary | ICD-10-CM | POA: Diagnosis not present

## 2019-05-03 DIAGNOSIS — C9111 Chronic lymphocytic leukemia of B-cell type in remission: Secondary | ICD-10-CM | POA: Diagnosis not present

## 2019-05-03 DIAGNOSIS — I2510 Atherosclerotic heart disease of native coronary artery without angina pectoris: Secondary | ICD-10-CM | POA: Diagnosis not present

## 2019-05-04 DIAGNOSIS — I130 Hypertensive heart and chronic kidney disease with heart failure and stage 1 through stage 4 chronic kidney disease, or unspecified chronic kidney disease: Secondary | ICD-10-CM | POA: Diagnosis not present

## 2019-05-04 DIAGNOSIS — I5032 Chronic diastolic (congestive) heart failure: Secondary | ICD-10-CM | POA: Diagnosis not present

## 2019-05-04 DIAGNOSIS — N183 Chronic kidney disease, stage 3 unspecified: Secondary | ICD-10-CM | POA: Diagnosis not present

## 2019-05-04 DIAGNOSIS — I2510 Atherosclerotic heart disease of native coronary artery without angina pectoris: Secondary | ICD-10-CM | POA: Diagnosis not present

## 2019-05-04 DIAGNOSIS — I69354 Hemiplegia and hemiparesis following cerebral infarction affecting left non-dominant side: Secondary | ICD-10-CM | POA: Diagnosis not present

## 2019-05-04 DIAGNOSIS — C9111 Chronic lymphocytic leukemia of B-cell type in remission: Secondary | ICD-10-CM | POA: Diagnosis not present

## 2019-05-05 DIAGNOSIS — C9111 Chronic lymphocytic leukemia of B-cell type in remission: Secondary | ICD-10-CM | POA: Diagnosis not present

## 2019-05-05 DIAGNOSIS — I2510 Atherosclerotic heart disease of native coronary artery without angina pectoris: Secondary | ICD-10-CM | POA: Diagnosis not present

## 2019-05-05 DIAGNOSIS — N183 Chronic kidney disease, stage 3 unspecified: Secondary | ICD-10-CM | POA: Diagnosis not present

## 2019-05-05 DIAGNOSIS — I5032 Chronic diastolic (congestive) heart failure: Secondary | ICD-10-CM | POA: Diagnosis not present

## 2019-05-05 DIAGNOSIS — I130 Hypertensive heart and chronic kidney disease with heart failure and stage 1 through stage 4 chronic kidney disease, or unspecified chronic kidney disease: Secondary | ICD-10-CM | POA: Diagnosis not present

## 2019-05-05 DIAGNOSIS — I69354 Hemiplegia and hemiparesis following cerebral infarction affecting left non-dominant side: Secondary | ICD-10-CM | POA: Diagnosis not present

## 2019-05-06 ENCOUNTER — Telehealth: Payer: Self-pay | Admitting: *Deleted

## 2019-05-06 DIAGNOSIS — I69354 Hemiplegia and hemiparesis following cerebral infarction affecting left non-dominant side: Secondary | ICD-10-CM | POA: Diagnosis not present

## 2019-05-06 DIAGNOSIS — I2510 Atherosclerotic heart disease of native coronary artery without angina pectoris: Secondary | ICD-10-CM | POA: Diagnosis not present

## 2019-05-06 DIAGNOSIS — N183 Chronic kidney disease, stage 3 unspecified: Secondary | ICD-10-CM | POA: Diagnosis not present

## 2019-05-06 DIAGNOSIS — I130 Hypertensive heart and chronic kidney disease with heart failure and stage 1 through stage 4 chronic kidney disease, or unspecified chronic kidney disease: Secondary | ICD-10-CM | POA: Diagnosis not present

## 2019-05-06 DIAGNOSIS — I5032 Chronic diastolic (congestive) heart failure: Secondary | ICD-10-CM | POA: Diagnosis not present

## 2019-05-06 DIAGNOSIS — C9111 Chronic lymphocytic leukemia of B-cell type in remission: Secondary | ICD-10-CM | POA: Diagnosis not present

## 2019-05-06 NOTE — Telephone Encounter (Signed)
[place] Therapist with [facility] called triage. She wanted to relay a message to PCP. Pt is being d/c from speech therapy but she wanted to let Dr. Darnell Level know that pt has been coughing more after he eats, also he is coughing more at night when he lays down. He advised the speech therapist that he had some GI issues and a surgery a long time ago but really couldn't tell her more about his GI issues. She thinks pt might benefit from a GI referral but she just wanted to let Dr. Darnell Level be aware because pt has a f/u scheduled tomorrow with Dr. Darnell Level at 2pm.   ?? If pt can come in office with cough (only after eating and night) they think it's related to GI issues and no other sxs  [place] said if you need anything else from her, her CB [phone]

## 2019-05-07 ENCOUNTER — Ambulatory Visit (INDEPENDENT_AMBULATORY_CARE_PROVIDER_SITE_OTHER): Payer: Medicare Other | Admitting: Family Medicine

## 2019-05-07 ENCOUNTER — Encounter: Payer: Self-pay | Admitting: Family Medicine

## 2019-05-07 ENCOUNTER — Other Ambulatory Visit: Payer: Self-pay

## 2019-05-07 VITALS — BP 134/72 | HR 85 | Temp 98.0°F | Ht 69.0 in | Wt 152.3 lb

## 2019-05-07 DIAGNOSIS — I69391 Dysphagia following cerebral infarction: Secondary | ICD-10-CM

## 2019-05-07 DIAGNOSIS — R4781 Slurred speech: Secondary | ICD-10-CM | POA: Diagnosis not present

## 2019-05-07 DIAGNOSIS — K5909 Other constipation: Secondary | ICD-10-CM

## 2019-05-07 DIAGNOSIS — K219 Gastro-esophageal reflux disease without esophagitis: Secondary | ICD-10-CM

## 2019-05-07 DIAGNOSIS — I6930 Unspecified sequelae of cerebral infarction: Secondary | ICD-10-CM

## 2019-05-07 DIAGNOSIS — I69359 Hemiplegia and hemiparesis following cerebral infarction affecting unspecified side: Secondary | ICD-10-CM

## 2019-05-07 DIAGNOSIS — I6521 Occlusion and stenosis of right carotid artery: Secondary | ICD-10-CM | POA: Diagnosis not present

## 2019-05-07 DIAGNOSIS — G3184 Mild cognitive impairment, so stated: Secondary | ICD-10-CM

## 2019-05-07 DIAGNOSIS — C9110 Chronic lymphocytic leukemia of B-cell type not having achieved remission: Secondary | ICD-10-CM

## 2019-05-07 MED ORDER — OMEPRAZOLE 20 MG PO CPDR: 20.0000 mg | DELAYED_RELEASE_CAPSULE | Freq: Every day | ORAL | 3 refills | Status: DC

## 2019-05-07 MED ORDER — SENNA 8.6 MG PO TABS: 1.0000 | ORAL_TABLET | Freq: Every day | ORAL | 1 refills | Status: DC

## 2019-05-07 NOTE — Progress Notes (Signed)
This visit was conducted in person.  BP 134/72 (BP Location: Right Arm, Patient Position: Sitting, Cuff Size: Normal)   Pulse 85   Temp 98 F (36.7 C) (Temporal)   Ht 5\' 9"  (1.753 m)   Wt 152 lb 5 oz (69.1 kg)   SpO2 95%   BMI 22.49 kg/m    CC: 6 mo f/u visit Subjective:    [name], male    DOB: 1928/06/08, 86 y.o.   MRN: [id number]  HPI: Jimmy [name] is a 86 y.o. male presenting on 05/07/2019 for Follow-up (Here for 6 mo f/u.  Pt accompanied by wife, [name]- temp 97.6.)   Received note from [place] speech therapist about discharge - but patient having ongoing cough with eating/swallowing, worse when laying down. Bad choking episode this morning with emesis. Ongoing trouble with this since initial stroke ~2017.   H/o EGD 1989 with gastritis.   Seen 01/2019 with fecal impaction - miralax increased to BID - but this causes worsening stool incontinence. He is currently taking miralax once weekly, not taking sennakot. Ongoing tenesmus - with incomplete emptying.   Sees cardiology and neurology for h/o CAD and recurrent CVA. Continues aspirin, statin.   Last fall was 04/2019.  Aricept started 04/19/2019 due to new visual hallucinations. Seems to be tolerating med well.      Relevant past medical, surgical, family and social history reviewed and updated as indicated. Interim medical history since our last visit reviewed. Allergies and medications reviewed and updated. Outpatient Medications Prior to Visit  Medication Sig Dispense Refill  . acetaminophen (TYLENOL) 325 MG tablet Take 2 tablets (650 mg total) by mouth every 4 (four) hours as needed for mild pain (or temp > 37.5 C (99.5 F)).    [name] allopurinol (ZYLOPRIM) 300 MG tablet TAKE 1 TABLET DAILY 90 tablet 3  . aspirin EC 325 MG EC tablet Take 1 tablet (325 mg total) by mouth daily. 180 tablet 0  . cholecalciferol (VITAMIN D) 1000 units tablet Take 2,000 Units by mouth daily.    . diphenhydrAMINE (BENADRYL  ALLERGY) 25 MG tablet Take 1 tablet (25 mg total) by mouth at bedtime as needed for sleep.    [name] donepezil (ARICEPT) 5 MG tablet Take 1 tablet by mouth daily.    [name] dronabinol (MARINOL) 2.5 MG capsule Take 1 capsule (2.5 mg total) by mouth at bedtime. 30 capsule 1  . lisinopril (ZESTRIL) 5 MG tablet TAKE 1 TABLET DAILY (NEW DOSE) 90 tablet 3  . nitroGLYCERIN (NITROSTAT) 0.4 MG SL tablet Place 1 tablet (0.4 mg total) under the tongue every 5 (five) minutes as needed for chest pain (max 3 doses in 15 minutes). 25 tablet 3  . polyethylene glycol powder (GLYCOLAX/MIRALAX) powder Take 17 g by mouth daily.    . simvastatin (ZOCOR) 10 MG tablet TAKE 1 TABLET AT BEDTIME 90 tablet 3  . valACYclovir (VALTREX) 500 MG tablet Take 1 tablet (500 mg total) by mouth daily. 90 tablet 1  . vitamin B-12 (CYANOCOBALAMIN) 1000 MCG tablet Take 1 tablet (1,000 mcg total) by mouth daily.     No facility-administered medications prior to visit.     Per HPI unless specifically indicated in ROS section below Review of Systems Objective:    BP 134/72 (BP Location: Right Arm, Patient Position: Sitting, Cuff Size: Normal)   Pulse 85   Temp 98 F (36.7 C) (Temporal)   Ht 5\' 9"  (1.753 m)   Wt 152 lb 5 oz (69.1  kg)   SpO2 95%   BMI 22.49 kg/m   Wt Readings from Last 3 Encounters:  05/07/19 152 lb 5 oz (69.1 kg)  04/21/19 156 lb (70.8 kg)  02/16/19 158 lb (71.7 kg)    Physical Exam Vitals and nursing note reviewed.  Constitutional:      Appearance: Normal appearance. He is not ill-appearing.     Comments: In wheelchair  Eyes:     Extraocular Movements: Extraocular movements intact.     Pupils: Pupils are equal, round, and reactive to light.  Cardiovascular:     Rate and Rhythm: Normal rate and regular rhythm.     Pulses: Normal pulses.     Heart sounds: Normal heart sounds. No murmur.  Pulmonary:     Effort: Pulmonary effort is normal. No respiratory distress.     Breath sounds: Normal breath sounds. No  wheezing, rhonchi or rales.  Abdominal:     General: Abdomen is flat. Bowel sounds are normal. There is no distension.     Palpations: Abdomen is soft. There is no mass.     Tenderness: There is no abdominal tenderness. There is no guarding or rebound.  Musculoskeletal:     Right lower leg: No edema.     Left lower leg: No edema.  Neurological:     Mental Status: He is alert.  Psychiatric:        Mood and Affect: Mood normal.        Behavior: Behavior normal.       Results for orders placed or performed in visit on 10/27/18  TSH  Result Value Ref Range   TSH 4.33 0.35 - 4.50 uIU/mL  Uric acid  Result Value Ref Range   Uric Acid, Serum 4.5 4.0 - 7.8 mg/dL  VITAMIN D 25 Hydroxy (Vit-D Deficiency, Fractures)  Result Value Ref Range   VITD 65.82 30.00 - 100.00 ng/mL  Comprehensive metabolic panel  Result Value Ref Range   Sodium 141 135 - 145 mEq/L   Potassium 4.1 3.5 - 5.1 mEq/L   Chloride 103 96 - 112 mEq/L   CO2 31 19 - 32 mEq/L   Glucose, Bld 142 (H) 70 - 99 mg/dL   BUN 22 6 - 23 mg/dL   Creatinine, Ser 1.25 0.40 - 1.50 mg/dL   Total Bilirubin 0.6 0.2 - 1.2 mg/dL   Alkaline Phosphatase 119 (H) 39 - 117 U/L   AST 13 0 - 37 U/L   ALT 13 0 - 53 U/L   Total Protein 6.3 6.0 - 8.3 g/dL   Albumin 4.1 3.5 - 5.2 g/dL   Calcium 9.9 8.4 - 10.5 mg/dL   GFR 54.56 (L) >60.00 mL/min   Lab Results  Component Value Date   WBC 8.7 09/09/2018   HGB 15.7 09/09/2018   HCT 47.1 09/09/2018   MCV 93.6 09/09/2018   PLT 176 09/09/2018    Assessment & Plan:  This visit occurred during the SARS-CoV-2 public health emergency.  Safety protocols were in place, including screening questions prior to the visit, additional usage of staff PPE, and extensive cleaning of exam room while observing appropriate contact time as indicated for disinfecting solutions.   Problem List Items Addressed This Visit      Chronic   Chronic lymphocytic leukemia, Rai stage 0 ([place])    Sees [facility] onc -  appreciate onc care.         Other   Slurred speech    Appreciate HH ST care.  MCI (mild cognitive impairment) with memory loss    Recently started on aricept (neuro Dr Manuella Ghazi)      History of stroke with residual deficit    Continues full dose aspirin.       History of hemorrhagic stroke with residual hemiparesis ([place])    Hemorrhagic stroke 2020 now off aggrenox, only on full dose aspirin.       GERD    H/o this, EGD 1989 showed gastritis. No recent evaluation. With recent description of coughing/choking with eating, concern for aspiration risk. Start daily PPI and refer to GI.  Recently started aricept could contribute to worsening GI symptoms.       Relevant Medications   senna (SENOKOT) 8.6 MG TABS tablet   omeprazole (PRILOSEC) 20 MG capsule   Other Relevant Orders   Ambulatory referral to Gastroenterology   Dysphagia, post-stroke - Primary    Ongoing trouble with post prandial choking, cough, dysphagia for several years, more recently progressively worsening. Will start PPI and refer to GI for further evaluation. Pt agrees with plan.       Relevant Orders   Ambulatory referral to Gastroenterology   Chronic constipation with overflow incontinence    Ongoing trouble associated with rectal pain and episodes of stool incontinence.  Regular miralax seems to have worsened incontinence episodes - rec change to PRN and instead start daily sennakot. Update with effect. Refer to GI for further evaluation       Relevant Orders   Ambulatory referral to Gastroenterology       Meds ordered this encounter  Medications  . senna (SENOKOT) 8.6 MG TABS tablet    Sig: Take 1 tablet (8.6 mg total) by mouth daily.    Dispense:  90 tablet    Refill:  1  . omeprazole (PRILOSEC) 20 MG capsule    Sig: Take 1 capsule (20 mg total) by mouth daily.    Dispense:  30 capsule    Refill:  3   Orders Placed This Encounter  Procedures  . Ambulatory referral to Gastroenterology     Referral Priority:   Routine    Referral Type:   Consultation    Referral Reason:   Specialty Services Required    Number of Visits Requested:   1    Patient Instructions  Start omeprazole 20mg  daily for 3 weeks then as needed to see if cough improves. I will also refer you to GI for further evaluation. I wonder if aricept is causing worsening GI symptoms presenting with cough.  Try sennakot daily - with miralax as needed for worsening constipation.    Follow up plan: Return in about 3 months (around 08/04/2019) for follow up visit.  Ria Bush, MD

## 2019-05-07 NOTE — Patient Instructions (Addendum)
Start omeprazole 20mg  daily for 3 weeks then as needed to see if cough improves. I will also refer you to GI for further evaluation. I wonder if aricept is causing worsening GI symptoms presenting with cough.  Try sennakot daily - with miralax as needed for worsening constipation.

## 2019-05-07 NOTE — Telephone Encounter (Signed)
Noted. Will see today.  

## 2019-05-09 NOTE — Assessment & Plan Note (Addendum)
Ongoing trouble with post prandial choking, cough, dysphagia for several years, more recently progressively worsening. Will start PPI and refer to GI for further evaluation. Pt agrees with plan.

## 2019-05-09 NOTE — Assessment & Plan Note (Signed)
Appreciate HH ST care.

## 2019-05-09 NOTE — Assessment & Plan Note (Signed)
Ongoing trouble associated with rectal pain and episodes of stool incontinence.  Regular miralax seems to have worsened incontinence episodes - rec change to PRN and instead start daily sennakot. Update with effect. Refer to GI for further evaluation

## 2019-05-09 NOTE — Assessment & Plan Note (Signed)
Continues full dose aspirin.

## 2019-05-09 NOTE — Assessment & Plan Note (Signed)
Sees [facility] - appreciate onc care.

## 2019-05-09 NOTE — Assessment & Plan Note (Signed)
Recently started on aricept (neuro Dr Manuella Ghazi)

## 2019-05-09 NOTE — Assessment & Plan Note (Addendum)
H/o this, EGD 1989 showed gastritis. No recent evaluation. With recent description of coughing/choking with eating, concern for aspiration risk. Start daily PPI and refer to GI.  Recently started aricept could contribute to worsening GI symptoms.

## 2019-05-09 NOTE — Assessment & Plan Note (Signed)
Hemorrhagic stroke 2020 now off aggrenox, only on full dose aspirin.

## 2019-05-10 ENCOUNTER — Encounter: Payer: Self-pay | Admitting: Internal Medicine

## 2019-05-13 DIAGNOSIS — I5032 Chronic diastolic (congestive) heart failure: Secondary | ICD-10-CM | POA: Diagnosis not present

## 2019-05-13 DIAGNOSIS — C9111 Chronic lymphocytic leukemia of B-cell type in remission: Secondary | ICD-10-CM | POA: Diagnosis not present

## 2019-05-13 DIAGNOSIS — N183 Chronic kidney disease, stage 3 unspecified: Secondary | ICD-10-CM | POA: Diagnosis not present

## 2019-05-13 DIAGNOSIS — I69354 Hemiplegia and hemiparesis following cerebral infarction affecting left non-dominant side: Secondary | ICD-10-CM | POA: Diagnosis not present

## 2019-05-13 DIAGNOSIS — I130 Hypertensive heart and chronic kidney disease with heart failure and stage 1 through stage 4 chronic kidney disease, or unspecified chronic kidney disease: Secondary | ICD-10-CM | POA: Diagnosis not present

## 2019-05-13 DIAGNOSIS — I2510 Atherosclerotic heart disease of native coronary artery without angina pectoris: Secondary | ICD-10-CM | POA: Diagnosis not present

## 2019-05-17 DIAGNOSIS — L578 Other skin changes due to chronic exposure to nonionizing radiation: Secondary | ICD-10-CM | POA: Diagnosis not present

## 2019-05-17 DIAGNOSIS — C44391 Other specified malignant neoplasm of skin of nose: Secondary | ICD-10-CM | POA: Diagnosis not present

## 2019-05-17 DIAGNOSIS — L821 Other seborrheic keratosis: Secondary | ICD-10-CM | POA: Diagnosis not present

## 2019-05-17 DIAGNOSIS — C4499 Other specified malignant neoplasm of skin, unspecified: Secondary | ICD-10-CM

## 2019-05-17 DIAGNOSIS — L820 Inflamed seborrheic keratosis: Secondary | ICD-10-CM | POA: Diagnosis not present

## 2019-05-17 DIAGNOSIS — Z85828 Personal history of other malignant neoplasm of skin: Secondary | ICD-10-CM | POA: Diagnosis not present

## 2019-05-17 HISTORY — DX: Other specified malignant neoplasm of skin, unspecified: C44.99

## 2019-05-21 DIAGNOSIS — I69354 Hemiplegia and hemiparesis following cerebral infarction affecting left non-dominant side: Secondary | ICD-10-CM | POA: Diagnosis not present

## 2019-05-21 DIAGNOSIS — N183 Chronic kidney disease, stage 3 unspecified: Secondary | ICD-10-CM | POA: Diagnosis not present

## 2019-05-21 DIAGNOSIS — I130 Hypertensive heart and chronic kidney disease with heart failure and stage 1 through stage 4 chronic kidney disease, or unspecified chronic kidney disease: Secondary | ICD-10-CM | POA: Diagnosis not present

## 2019-05-21 DIAGNOSIS — C9111 Chronic lymphocytic leukemia of B-cell type in remission: Secondary | ICD-10-CM | POA: Diagnosis not present

## 2019-05-21 DIAGNOSIS — I5032 Chronic diastolic (congestive) heart failure: Secondary | ICD-10-CM | POA: Diagnosis not present

## 2019-05-21 DIAGNOSIS — I2510 Atherosclerotic heart disease of native coronary artery without angina pectoris: Secondary | ICD-10-CM | POA: Diagnosis not present

## 2019-05-22 DIAGNOSIS — I130 Hypertensive heart and chronic kidney disease with heart failure and stage 1 through stage 4 chronic kidney disease, or unspecified chronic kidney disease: Secondary | ICD-10-CM | POA: Diagnosis not present

## 2019-05-22 DIAGNOSIS — Z951 Presence of aortocoronary bypass graft: Secondary | ICD-10-CM | POA: Diagnosis not present

## 2019-05-22 DIAGNOSIS — I5032 Chronic diastolic (congestive) heart failure: Secondary | ICD-10-CM | POA: Diagnosis not present

## 2019-05-22 DIAGNOSIS — Z79899 Other long term (current) drug therapy: Secondary | ICD-10-CM | POA: Diagnosis not present

## 2019-05-22 DIAGNOSIS — N183 Chronic kidney disease, stage 3 unspecified: Secondary | ICD-10-CM | POA: Diagnosis not present

## 2019-05-22 DIAGNOSIS — C9111 Chronic lymphocytic leukemia of B-cell type in remission: Secondary | ICD-10-CM | POA: Diagnosis not present

## 2019-05-22 DIAGNOSIS — I69354 Hemiplegia and hemiparesis following cerebral infarction affecting left non-dominant side: Secondary | ICD-10-CM | POA: Diagnosis not present

## 2019-05-22 DIAGNOSIS — R1310 Dysphagia, unspecified: Secondary | ICD-10-CM | POA: Diagnosis not present

## 2019-05-22 DIAGNOSIS — Z7982 Long term (current) use of aspirin: Secondary | ICD-10-CM | POA: Diagnosis not present

## 2019-05-22 DIAGNOSIS — I69391 Dysphagia following cerebral infarction: Secondary | ICD-10-CM | POA: Diagnosis not present

## 2019-05-22 DIAGNOSIS — Z8679 Personal history of other diseases of the circulatory system: Secondary | ICD-10-CM | POA: Diagnosis not present

## 2019-05-22 DIAGNOSIS — I2510 Atherosclerotic heart disease of native coronary artery without angina pectoris: Secondary | ICD-10-CM | POA: Diagnosis not present

## 2019-05-22 DIAGNOSIS — D696 Thrombocytopenia, unspecified: Secondary | ICD-10-CM | POA: Diagnosis not present

## 2019-05-25 DIAGNOSIS — I130 Hypertensive heart and chronic kidney disease with heart failure and stage 1 through stage 4 chronic kidney disease, or unspecified chronic kidney disease: Secondary | ICD-10-CM | POA: Diagnosis not present

## 2019-05-25 DIAGNOSIS — I5032 Chronic diastolic (congestive) heart failure: Secondary | ICD-10-CM | POA: Diagnosis not present

## 2019-05-25 DIAGNOSIS — N183 Chronic kidney disease, stage 3 unspecified: Secondary | ICD-10-CM | POA: Diagnosis not present

## 2019-05-25 DIAGNOSIS — C9111 Chronic lymphocytic leukemia of B-cell type in remission: Secondary | ICD-10-CM | POA: Diagnosis not present

## 2019-05-25 DIAGNOSIS — I69354 Hemiplegia and hemiparesis following cerebral infarction affecting left non-dominant side: Secondary | ICD-10-CM | POA: Diagnosis not present

## 2019-05-25 DIAGNOSIS — I2510 Atherosclerotic heart disease of native coronary artery without angina pectoris: Secondary | ICD-10-CM | POA: Diagnosis not present

## 2019-05-28 ENCOUNTER — Telehealth: Payer: Self-pay | Admitting: Family Medicine

## 2019-05-28 DIAGNOSIS — I5032 Chronic diastolic (congestive) heart failure: Secondary | ICD-10-CM | POA: Diagnosis not present

## 2019-05-28 DIAGNOSIS — N183 Chronic kidney disease, stage 3 unspecified: Secondary | ICD-10-CM | POA: Diagnosis not present

## 2019-05-28 DIAGNOSIS — C9111 Chronic lymphocytic leukemia of B-cell type in remission: Secondary | ICD-10-CM | POA: Diagnosis not present

## 2019-05-28 DIAGNOSIS — Z66 Do not resuscitate: Secondary | ICD-10-CM | POA: Insufficient documentation

## 2019-05-28 DIAGNOSIS — I69354 Hemiplegia and hemiparesis following cerebral infarction affecting left non-dominant side: Secondary | ICD-10-CM | POA: Diagnosis not present

## 2019-05-28 DIAGNOSIS — I130 Hypertensive heart and chronic kidney disease with heart failure and stage 1 through stage 4 chronic kidney disease, or unspecified chronic kidney disease: Secondary | ICD-10-CM | POA: Diagnosis not present

## 2019-05-28 DIAGNOSIS — I2510 Atherosclerotic heart disease of native coronary artery without angina pectoris: Secondary | ICD-10-CM | POA: Diagnosis not present

## 2019-05-28 DIAGNOSIS — Z7189 Other specified counseling: Secondary | ICD-10-CM

## 2019-05-28 NOTE — Telephone Encounter (Signed)
Spoke with wife [name].  Yesterday afternoon Jimmy [name] had episode of word babble making no sense, could not tell the time, wife gave him 325mg  aspirin. Symptoms now fully back to normal.  BP running well controlled.  Next neurology f/u scheduled for 09/16/2019.   Wife asks about DNR order for home.  Spoke with patient Jimmy [name], confirmed his desire for DNR I filled form and placed in Lisa's box. Wife will come by next week to pick up DNR order form.

## 2019-05-28 NOTE — Telephone Encounter (Signed)
Elta Guadeloupe, PT with [facility] went out to see the patient today He stated that Per the Wife she stated the patient had signs of a mini stroke.  She stated that during the time he was speaking unintelligently and lost half of his vision.  She told mark that if it had last more than 15 mins she was going to call the EMS but patient came out of it okay. Elta Guadeloupe stated he is there with the patient now and he is acting the same he did a few days ago when he went out. Elta Guadeloupe has not yet got the patient up walking to see how he is doing there.    Mark's Phone # [phone]

## 2019-05-31 NOTE — Telephone Encounter (Signed)
Spoke with pt/pt's wife, [name], notifying them DNR is ready to pick up.  Verbalizes understanding.   [Placed form at front office- yellow folders.]

## 2019-06-01 DIAGNOSIS — Z85828 Personal history of other malignant neoplasm of skin: Secondary | ICD-10-CM | POA: Diagnosis not present

## 2019-06-01 DIAGNOSIS — C44391 Other specified malignant neoplasm of skin of nose: Secondary | ICD-10-CM | POA: Diagnosis not present

## 2019-06-02 DIAGNOSIS — N183 Chronic kidney disease, stage 3 unspecified: Secondary | ICD-10-CM | POA: Diagnosis not present

## 2019-06-02 DIAGNOSIS — I2510 Atherosclerotic heart disease of native coronary artery without angina pectoris: Secondary | ICD-10-CM | POA: Diagnosis not present

## 2019-06-02 DIAGNOSIS — I5032 Chronic diastolic (congestive) heart failure: Secondary | ICD-10-CM | POA: Diagnosis not present

## 2019-06-02 DIAGNOSIS — I130 Hypertensive heart and chronic kidney disease with heart failure and stage 1 through stage 4 chronic kidney disease, or unspecified chronic kidney disease: Secondary | ICD-10-CM | POA: Diagnosis not present

## 2019-06-02 DIAGNOSIS — I69354 Hemiplegia and hemiparesis following cerebral infarction affecting left non-dominant side: Secondary | ICD-10-CM | POA: Diagnosis not present

## 2019-06-02 DIAGNOSIS — C9111 Chronic lymphocytic leukemia of B-cell type in remission: Secondary | ICD-10-CM | POA: Diagnosis not present

## 2019-06-04 DIAGNOSIS — I130 Hypertensive heart and chronic kidney disease with heart failure and stage 1 through stage 4 chronic kidney disease, or unspecified chronic kidney disease: Secondary | ICD-10-CM | POA: Diagnosis not present

## 2019-06-04 DIAGNOSIS — C9111 Chronic lymphocytic leukemia of B-cell type in remission: Secondary | ICD-10-CM | POA: Diagnosis not present

## 2019-06-04 DIAGNOSIS — N183 Chronic kidney disease, stage 3 unspecified: Secondary | ICD-10-CM | POA: Diagnosis not present

## 2019-06-04 DIAGNOSIS — I2510 Atherosclerotic heart disease of native coronary artery without angina pectoris: Secondary | ICD-10-CM | POA: Diagnosis not present

## 2019-06-04 DIAGNOSIS — I5032 Chronic diastolic (congestive) heart failure: Secondary | ICD-10-CM | POA: Diagnosis not present

## 2019-06-04 DIAGNOSIS — I69354 Hemiplegia and hemiparesis following cerebral infarction affecting left non-dominant side: Secondary | ICD-10-CM | POA: Diagnosis not present

## 2019-06-08 ENCOUNTER — Telehealth: Payer: Self-pay

## 2019-06-08 ENCOUNTER — Other Ambulatory Visit: Payer: Self-pay

## 2019-06-08 ENCOUNTER — Emergency Department
Admission: EM | Admit: 2019-06-08 | Discharge: 2019-06-08 | Disposition: A | Discharge status: Home / Self Care | Payer: Medicare Other | Attending: Emergency Medicine | Admitting: Emergency Medicine

## 2019-06-08 ENCOUNTER — Emergency Department: Payer: Medicare Other

## 2019-06-08 DIAGNOSIS — R29898 Other symptoms and signs involving the musculoskeletal system: Secondary | ICD-10-CM

## 2019-06-08 DIAGNOSIS — R410 Disorientation, unspecified: Secondary | ICD-10-CM | POA: Diagnosis not present

## 2019-06-08 DIAGNOSIS — W19XXXA Unspecified fall, initial encounter: Secondary | ICD-10-CM | POA: Diagnosis not present

## 2019-06-08 DIAGNOSIS — R279 Unspecified lack of coordination: Secondary | ICD-10-CM | POA: Diagnosis not present

## 2019-06-08 DIAGNOSIS — I5030 Unspecified diastolic (congestive) heart failure: Secondary | ICD-10-CM | POA: Diagnosis not present

## 2019-06-08 DIAGNOSIS — I6930 Unspecified sequelae of cerebral infarction: Secondary | ICD-10-CM

## 2019-06-08 DIAGNOSIS — G8929 Other chronic pain: Secondary | ICD-10-CM

## 2019-06-08 DIAGNOSIS — Z79899 Other long term (current) drug therapy: Secondary | ICD-10-CM | POA: Diagnosis not present

## 2019-06-08 DIAGNOSIS — R531 Weakness: Secondary | ICD-10-CM | POA: Diagnosis not present

## 2019-06-08 DIAGNOSIS — R4182 Altered mental status, unspecified: Secondary | ICD-10-CM | POA: Diagnosis not present

## 2019-06-08 DIAGNOSIS — Z743 Need for continuous supervision: Secondary | ICD-10-CM | POA: Diagnosis not present

## 2019-06-08 DIAGNOSIS — Z8673 Personal history of transient ischemic attack (TIA), and cerebral infarction without residual deficits: Secondary | ICD-10-CM | POA: Insufficient documentation

## 2019-06-08 DIAGNOSIS — I69359 Hemiplegia and hemiparesis following cerebral infarction affecting unspecified side: Secondary | ICD-10-CM

## 2019-06-08 DIAGNOSIS — I130 Hypertensive heart and chronic kidney disease with heart failure and stage 1 through stage 4 chronic kidney disease, or unspecified chronic kidney disease: Secondary | ICD-10-CM | POA: Insufficient documentation

## 2019-06-08 DIAGNOSIS — C9110 Chronic lymphocytic leukemia of B-cell type not having achieved remission: Secondary | ICD-10-CM

## 2019-06-08 DIAGNOSIS — R0902 Hypoxemia: Secondary | ICD-10-CM | POA: Diagnosis not present

## 2019-06-08 DIAGNOSIS — M545 Low back pain, unspecified: Secondary | ICD-10-CM

## 2019-06-08 DIAGNOSIS — N183 Chronic kidney disease, stage 3 unspecified: Secondary | ICD-10-CM | POA: Diagnosis not present

## 2019-06-08 LAB — URINALYSIS, COMPLETE (UACMP) WITH MICROSCOPIC
Bacteria, UA: NONE SEEN
Bilirubin Urine: NEGATIVE
Glucose, UA: NEGATIVE mg/dL
Hgb urine dipstick: NEGATIVE
Ketones, ur: NEGATIVE mg/dL
Leukocytes,Ua: NEGATIVE
Nitrite: NEGATIVE
Protein, ur: NEGATIVE mg/dL
Specific Gravity, Urine: 1.024 (ref 1.005–1.030)
Squamous Epithelial / HPF: NONE SEEN (ref 0–5)
pH: 5 (ref 5.0–8.0)

## 2019-06-08 LAB — CBC WITH DIFFERENTIAL/PLATELET
Abs Immature Granulocytes: 0.02 10*3/uL (ref 0.00–0.07)
Basophils Absolute: 0 10*3/uL (ref 0.0–0.1)
Basophils Relative: 1 %
Eosinophils Absolute: 0.1 10*3/uL (ref 0.0–0.5)
Eosinophils Relative: 2 %
HCT: 41 % (ref 39.0–52.0)
Hemoglobin: 14.2 g/dL (ref 13.0–17.0)
Immature Granulocytes: 0 %
Lymphocytes Relative: 21 %
Lymphs Abs: 1.3 10*3/uL (ref 0.7–4.0)
MCH: 31.8 pg (ref 26.0–34.0)
MCHC: 34.6 g/dL (ref 30.0–36.0)
MCV: 91.7 fL (ref 80.0–100.0)
Monocytes Absolute: 0.6 10*3/uL (ref 0.1–1.0)
Monocytes Relative: 9 %
Neutro Abs: 4.1 10*3/uL (ref 1.7–7.7)
Neutrophils Relative %: 67 %
Platelets: 165 10*3/uL (ref 150–400)
RBC: 4.47 MIL/uL (ref 4.22–5.81)
RDW: 13.6 % (ref 11.5–15.5)
WBC: 6.2 10*3/uL (ref 4.0–10.5)
nRBC: 0 % (ref 0.0–0.2)

## 2019-06-08 LAB — COMPREHENSIVE METABOLIC PANEL
ALT: 24 U/L (ref 0–44)
AST: 23 U/L (ref 15–41)
Albumin: 3.9 g/dL (ref 3.5–5.0)
Alkaline Phosphatase: 105 U/L (ref 38–126)
Anion gap: 5 (ref 5–15)
BUN: 30 mg/dL — ABNORMAL HIGH (ref 8–23)
CO2: 27 mmol/L (ref 22–32)
Calcium: 9.6 mg/dL (ref 8.9–10.3)
Chloride: 105 mmol/L (ref 98–111)
Creatinine, Ser: 1.21 mg/dL (ref 0.61–1.24)
GFR calc Af Amer: >60 mL/min (ref >60)
GFR calc non Af Amer: 53 mL/min — ABNORMAL LOW (ref >60)
Glucose, Bld: 162 mg/dL — ABNORMAL HIGH (ref 70–99)
Potassium: 3.7 mmol/L (ref 3.5–5.1)
Sodium: 137 mmol/L (ref 135–145)
Total Bilirubin: 0.8 mg/dL (ref 0.3–1.2)
Total Protein: 6.7 g/dL (ref 6.5–8.1)

## 2019-06-08 MED ORDER — LACTATED RINGERS IV BOLUS
1000.0000 mL | Freq: Once | INTRAVENOUS | Status: AC
  Administered 2019-06-08: 20:00:00 1000 mL via INTRAVENOUS

## 2019-06-08 NOTE — ED Notes (Signed)
PT taken to CT.

## 2019-06-08 NOTE — ED Provider Notes (Signed)
[facility] Emergency Department Provider Note   ____________________________________________   First MD Initiated Contact with Patient 06/08/19 1749     (approximate)  I have reviewed the triage vital signs and the nursing notes.   HISTORY  Chief Complaint Weakness and Altered Mental Status    HPI RAYYAAN BEGGS is a 86 y.o. male with past with history of CAD, COPD, CKD, hypertension, hyperlipidemia who presents to the ED for altered mental status.  Per EMS, patient has been increasingly weak and confused over the past 3 days with a fall while attempting to use his walker earlier today.  Patient currently denies any complaints and states "my wife is the one who is confused".  He denies any fevers, cough, chest pain, shortness of breath, dysuria, or hematuria.  He denies any pain following his fall earlier today.  Per EMS, patient has baseline left-sided deficits from prior stroke.        Past Medical History:  Diagnosis Date  . CAD (coronary artery disease)    a. 04/2001 S/P CABGx 4;  b. 2008 MV:  EF 63% normal perfusion.  . Carotid stenosis    a. 11/2012 Carotid U/S:  RICA [id number], LICA [id number].  . Chronic kidney disease, stage 3   . CLL (chronic lymphoblastic leukemia) 2009   Stage IV; Dr. Ivor Messier - referred to Dr. Lissa Merlin [facility] 07/2013 now on Rituxan (10/2013) --> stage 0 (09/2014)  . Diastolic CHF ([place])    a. [id number] Echo: EF 55-65%, mild conc LVH, Gr 1 DD, mild AS, triv AI, mildly dil Ao root (3.5 cm).  . GERD (gastroesophageal reflux disease) 1990s  . History of CVA (cerebrovascular accident) 2015   by MRI - remote L internal capsule  . History of herpes genitalis    valtrex daily  . Hyperlipemia 2002  . Hypertension 2002  . Mass of submandibular region 2015   referred to gen surg after chemo  . Mild aortic stenosis    a. 07/2011 Echo: Mild AS, triv AI.  [name] Stroke ([place])   . Thrombocytopenia ([place])    outpatient goals Hgb >9, plt >20  . TIA  (transient ischemic attack)    04/2016    Patient Active Problem List   Diagnosis Date Noted  . DNR (do not resuscitate) 05/28/2019  . Chronic constipation with overflow incontinence 02/02/2019  . Slurred speech 08/27/2018  . Hypertension with goal blood pressure less than 120/80   . Dysphagia, post-stroke   . History of hemorrhagic stroke with residual hemiparesis ([place]) 04/04/2018  . MCI (mild cognitive impairment) with memory loss 06/17/2017  . TIA (transient ischemic attack) 04/08/2016  . Thrombocytopenia ([place]) 04/08/2016  . Leg weakness 11/11/2014  . Advanced care planning/counseling discussion 09/16/2014  . Nocturia 09/16/2014  . Mass of submandibular region   . History of stroke with residual deficit   . Chest pressure 11/19/2013  . S/P CABG (coronary artery bypass graft) 11/19/2013  . Chronic kidney disease, stage 3   . Aortic stenosis   . Medicare annual wellness visit, subsequent 08/27/2012  . Vitamin D deficiency 08/17/2012  . Dyspnea 06/26/2011  . Erectile dysfunction 09/18/2010  . Carotid artery stenosis 12/01/2009  . UNSPECIFIED SUBJECTIVE VISUAL DISTURBANCE 05/01/2009  . BACK PAIN, LUMBAR 10/24/2008  . Chronic lymphocytic leukemia, Rai stage 0 ([place]) 04/02/2007    Class: Chronic  . DERMATITIS, SEBORRHEIC NOS 12/16/2006  . GENITAL HERPES 12/04/2006  . Mixed hyperlipidemia 12/04/2006  . Essential hypertension 12/04/2006  . Coronary atherosclerosis  12/04/2006  . GERD 12/04/2006  . Prediabetes 12/04/2006  . RENAL CALCULUS, HX OF 12/04/2006    Past Surgical History:  Procedure Laterality Date  . ANGIOPLASTY  1998  . CATARACT EXTRACTION, BILATERAL     R 1/09, L 8/09  . COLONOSCOPY  11/29/1987   Normal  . COLONOSCOPY  02/07/2003   Hemm. Internal, focal proctitis, negative biopsy  . COLONOSCOPY  12/12/2004   Internal external hemorrhoids, +proctits, negative biopsy  . CORONARY ANGIOPLASTY  1998  . CORONARY ARTERY BYPASS GRAFT  04/23/2001   x4, Dr. Pia Mau    . ESOPHAGOGASTRODUODENOSCOPY  11/29/1987   Gastritis  . ETT  12/10/2006   Persantine myoview nml  . HEMORROIDECTOMY  1954   Fissure repair, [place]  . HEPATIC ARTERY ANGIOPLASTY  1954   [place], [place]  . KIDNEY STONE SURGERY  1977   Dr Redmond Baseman  . LITHOTRIPSY  1990s   Multiple  . US ECHOCARDIOGRAPHY  07/2011   nl sys fxn, EF 55-60%, grade 1 diast dysfunction, mild AS, mildly elevated PA pressure    Prior to Admission medications   Medication Sig Start Date End Date Taking? Authorizing Provider  acetaminophen (TYLENOL) 325 MG tablet Take 2 tablets (650 mg total) by mouth every 4 (four) hours as needed for mild pain (or temp > 37.5 C (99.5 F)). 04/29/18   Angiulli, Lavon Paganini, PA-C  allopurinol (ZYLOPRIM) 300 MG tablet TAKE 1 TABLET DAILY 02/24/19   Ria Bush, MD  aspirin EC 325 MG EC tablet Take 1 tablet (325 mg total) by mouth daily. 08/29/18   Salary, Holly Bodily D, MD  cholecalciferol (VITAMIN D) 1000 units tablet Take 2,000 Units by mouth daily.    [provider]  diphenhydrAMINE (BENADRYL ALLERGY) 25 MG tablet Take 1 tablet (25 mg total) by mouth at bedtime as needed for sleep. 09/10/18   Ria Bush, MD  donepezil (ARICEPT) 5 MG tablet Take 1 tablet by mouth daily. 04/19/19 04/18/20  [provider]  dronabinol (MARINOL) 2.5 MG capsule Take 1 capsule (2.5 mg total) by mouth at bedtime. 09/09/18   Earleen Newport, MD  lisinopril (ZESTRIL) 5 MG tablet TAKE 1 TABLET DAILY (NEW DOSE) 12/21/18   Ria Bush, MD  nitroGLYCERIN (NITROSTAT) 0.4 MG SL tablet Place 1 tablet (0.4 mg total) under the tongue every 5 (five) minutes as needed for chest pain (max 3 doses in 15 minutes). 09/02/18   Ria Bush, MD  omeprazole (PRILOSEC) 20 MG capsule Take 1 capsule (20 mg total) by mouth daily. 05/07/19   Ria Bush, MD  polyethylene glycol powder (GLYCOLAX/MIRALAX) powder Take 17 g by mouth daily. 06/16/18   Ria Bush, MD  senna (SENOKOT) 8.6 MG TABS  tablet Take 1 tablet (8.6 mg total) by mouth daily. 05/07/19   Ria Bush, MD  simvastatin (ZOCOR) 10 MG tablet TAKE 1 TABLET AT BEDTIME 01/04/19   Ria Bush, MD  valACYclovir (VALTREX) 500 MG tablet Take 1 tablet (500 mg total) by mouth daily. 07/17/18   Ria Bush, MD  vitamin B-12 (CYANOCOBALAMIN) 1000 MCG tablet Take 1 tablet (1,000 mcg total) by mouth daily. 06/17/17   Ria Bush, MD    Allergies New skin and Tape  Family History  Problem Relation Age of Onset  . Hypertension Mother   . Heart disease Mother   . Hyperlipidemia Mother   . Hypertension Father   . Hyperlipidemia Father   . Kidney cancer Sister        Renal cell cancer  . Alcohol  abuse Brother   . Diabetes Brother   . Stroke Brother   . Heart attack Brother        MI  . Diabetes Other        Sister's daughter  . Depression Daughter        Bipolar    Social History Social History   Tobacco Use  . Smoking status: Former Smoker    Packs/day: 2.00    Years: 30.00    Pack years: 60.00    Quit date: 02/28/1979    Years since quitting: 40.3  . Smokeless tobacco: Never Used  . Tobacco comment: quit 1980  Substance Use Topics  . Alcohol use: No    Alcohol/week: 0.0 standard drinks    Comment: occasional wine  . Drug use: Never    Review of Systems  Constitutional: No fever/chills.  Positive for generalized weakness. Eyes: No visual changes. ENT: No sore throat. Cardiovascular: Denies chest pain. Respiratory: Denies shortness of breath. Gastrointestinal: No abdominal pain.  No nausea, no vomiting.  No diarrhea.  No constipation. Genitourinary: Negative for dysuria. Musculoskeletal: Negative for back pain. Skin: Negative for rash. Neurological: Negative for headaches, focal weakness or numbness.  ____________________________________________   PHYSICAL EXAM:  VITAL SIGNS: ED Triage Vitals  Enc Vitals Group     BP 06/08/19 1754 (!) 159/81     Pulse Rate 06/08/19 1754 69      Resp 06/08/19 1754 18     Temp 06/08/19 1754 (!) 97.5 F (36.4 C)     Temp Source 06/08/19 1754 Oral     SpO2 06/08/19 1754 97 %     Weight --      Height --      Head Circumference --      Peak Flow --      Pain Score 06/08/19 1755 0     Pain Loc --      Pain Edu? --      Excl. in [place]? --     Constitutional: Alert and oriented to person, place, time, and situation. Eyes: Conjunctivae are normal. Head: Atraumatic. Nose: No congestion/rhinnorhea. Mouth/Throat: Mucous membranes are moist. Neck: Normal ROM Cardiovascular: Normal rate, regular rhythm. Grossly normal heart sounds. Respiratory: Normal respiratory effort.  No retractions. Lungs CTAB. Gastrointestinal: Soft and nontender. No distention. Genitourinary: deferred Musculoskeletal: No lower extremity tenderness nor edema. Neurologic:  Normal speech and language.  Strength 4/5 in left upper and lower extremities, 5/5 in right upper and lower extremities, at patient's reported baseline. Skin:  Skin is warm, dry and intact. No rash noted. Psychiatric: Mood and affect are normal. Speech and behavior are normal.  ____________________________________________   LABS (all labs ordered are listed, but only abnormal results are displayed)  Labs Reviewed  COMPREHENSIVE METABOLIC PANEL - Abnormal; Notable for the following components:      Result Value   Glucose, Bld 162 (*)    BUN 30 (*)    GFR calc non Af Amer 53 (*)    All other components within normal limits  URINALYSIS, COMPLETE (UACMP) WITH MICROSCOPIC - Abnormal; Notable for the following components:   Color, Urine YELLOW (*)    APPearance CLEAR (*)    All other components within normal limits  CBC WITH DIFFERENTIAL/PLATELET   ____________________________________________  EKG  ED ECG REPORT I, Blake Divine, the attending physician, personally viewed and interpreted this ECG.   Date: 06/08/2019  EKG Time: 18:31  Rate: 62  Rhythm: normal sinus rhythm   Axis: Normal  Intervals:none  ST&T Change: Normal   PROCEDURES  Procedure(s) performed (including Critical Care):  Procedures   ____________________________________________   INITIAL IMPRESSION / ASSESSMENT AND PLAN / ED COURSE       86 year old male presents to the ED after calling EMS for increased weakness and confusion over the past 3 days.  Patient is alert and oriented x4 with no acute changes in his baseline neurologic exam. He denies any pain or other complaints and no apparent traumatic injury following reported fall. We will check CT head, labs, and UA for potential explanation for his weakness.  Work-up is unremarkable, CT head negative for acute process and patient remains alert and oriented with no focal deficits.  Lab work is also unremarkable and UA shows no evidence of UTI.  I would question if there is some underlying dementia as patient has previously been prescribed donepezil by neurology with initiation of dementia work-up.  He is appropriate for discharge home with PCP and neurology follow-up and I spoke with his wife on phone, who agrees with plan.      ____________________________________________   FINAL CLINICAL IMPRESSION(S) / ED DIAGNOSES  Final diagnoses:  Confusion  Generalized weakness     ED Discharge Orders    None       Note:  This document was prepared using Dragon voice recognition software and may include unintentional dictation errors.   Blake Divine, MD 06/08/19 2104

## 2019-06-08 NOTE — Telephone Encounter (Signed)
Mark PT with [facility] is at pts home and has seen major change in pts condition; concerned pt has had CVA. Mrs Street said she saw a change in pt on 06/05/19. On 06/06/19 pt slept all day and did not eat. On 06/07/19 pt was able to walk to toilet that morning but was very shaky. Last night pt had difficulty in walking and was on rolator walker trying to get to bathroom and pt fell off walker. Mark PT said lt side is affected; when sitting pt leans to the left and pts speech is slurred.pt has hx of CVA & TIA. Mrs Dudzik asked me to call 911 to take pt to [place]. I called 911 and they are going to pts home now. I offered to stay on phone with pts wife but she said Elta Guadeloupe PT said he would stay with her until EMT's get there so call was ended. Mrs Sipos was advised if pt condition were to change or worsen prior to EMT arrival to call 911 back. Mrs Skemp voiced understanding. FYI to DR G.

## 2019-06-08 NOTE — Telephone Encounter (Signed)
Noted thank you. plz call for an update tomorrow.

## 2019-06-08 NOTE — ED Triage Notes (Signed)
PT to ED via EMS from home. Wife called out c/o increased weakness and confusion since Saturday. PT has been talking to people who are not there and had a fall with is walker this morning. PT is alert and oriented x4. No known dementia. HX of R sided hemorrhagic stroke and left sided stroke related deficits.

## 2019-06-09 NOTE — Telephone Encounter (Signed)
Patient's wife returned call  She stated that the aide came this morning and gave Jimmy [name] a shower and was able to get him back into his chair so he could eat breakfast. Wife stated that he has been eating today . And was eating at the time of our call. She stated that he is still very Weak, and needs assistance to and from the bathroom  She stated that patient can get up and on the [place] for a little bit but gets tired very easily. He made it to the bathroom on the rolator today but it was very difficult to get him back up because he is so weak and was worn out when he got to the restroom .  Patient's wife stated the patient has an appointment next week with [facility] but stated it is very hard to get him in and out of a car so they may see if they are able to do that appointment virtually .

## 2019-06-09 NOTE — Telephone Encounter (Signed)
Lvm for pt's wife, [name] (on dpr), to call back.  Need to get an update on pt.

## 2019-06-11 DIAGNOSIS — C9111 Chronic lymphocytic leukemia of B-cell type in remission: Secondary | ICD-10-CM | POA: Diagnosis not present

## 2019-06-11 DIAGNOSIS — I2510 Atherosclerotic heart disease of native coronary artery without angina pectoris: Secondary | ICD-10-CM | POA: Diagnosis not present

## 2019-06-11 DIAGNOSIS — I69354 Hemiplegia and hemiparesis following cerebral infarction affecting left non-dominant side: Secondary | ICD-10-CM | POA: Diagnosis not present

## 2019-06-11 DIAGNOSIS — I130 Hypertensive heart and chronic kidney disease with heart failure and stage 1 through stage 4 chronic kidney disease, or unspecified chronic kidney disease: Secondary | ICD-10-CM | POA: Diagnosis not present

## 2019-06-11 DIAGNOSIS — N183 Chronic kidney disease, stage 3 unspecified: Secondary | ICD-10-CM | POA: Diagnosis not present

## 2019-06-11 DIAGNOSIS — I5032 Chronic diastolic (congestive) heart failure: Secondary | ICD-10-CM | POA: Diagnosis not present

## 2019-06-16 DIAGNOSIS — C44391 Other specified malignant neoplasm of skin of nose: Secondary | ICD-10-CM | POA: Diagnosis not present

## 2019-06-17 ENCOUNTER — Telehealth: Payer: Self-pay

## 2019-06-17 DIAGNOSIS — I5032 Chronic diastolic (congestive) heart failure: Secondary | ICD-10-CM | POA: Diagnosis not present

## 2019-06-17 DIAGNOSIS — I130 Hypertensive heart and chronic kidney disease with heart failure and stage 1 through stage 4 chronic kidney disease, or unspecified chronic kidney disease: Secondary | ICD-10-CM | POA: Diagnosis not present

## 2019-06-17 DIAGNOSIS — I69354 Hemiplegia and hemiparesis following cerebral infarction affecting left non-dominant side: Secondary | ICD-10-CM | POA: Diagnosis not present

## 2019-06-17 DIAGNOSIS — I2510 Atherosclerotic heart disease of native coronary artery without angina pectoris: Secondary | ICD-10-CM | POA: Diagnosis not present

## 2019-06-17 DIAGNOSIS — C9111 Chronic lymphocytic leukemia of B-cell type in remission: Secondary | ICD-10-CM | POA: Diagnosis not present

## 2019-06-17 DIAGNOSIS — N183 Chronic kidney disease, stage 3 unspecified: Secondary | ICD-10-CM | POA: Diagnosis not present

## 2019-06-17 NOTE — Telephone Encounter (Addendum)
Spoke with wife.  They have aide coming MWF to help shower. This aide will also start coming out M-F at night.  Discussed palliative care eval. Wife is interested in this, but she nor husband are yet ready for hospice evaluation or discussion.   By the way - patient will need to have skin surgery to nose (07/21/2019).

## 2019-06-17 NOTE — Telephone Encounter (Signed)
Telephone call to patient to schedule palliative care visit.  Patient did not answer phone, RN left message requesting cal back to schedule palliative care visit.

## 2019-06-17 NOTE — Telephone Encounter (Signed)
Telephone call to patients wife to schedule palliative care visit.  Wife in agreement with RN making home visit 06/18/19 a 1:00 PM.

## 2019-06-17 NOTE — Addendum Note (Signed)
Addended by: Ria Bush on: 06/17/2019 02:00 PM   Modules accepted: Orders

## 2019-06-18 ENCOUNTER — Other Ambulatory Visit: Payer: Medicare Other

## 2019-06-18 ENCOUNTER — Other Ambulatory Visit: Payer: Self-pay

## 2019-06-18 DIAGNOSIS — Z515 Encounter for palliative care: Secondary | ICD-10-CM

## 2019-06-18 NOTE — Progress Notes (Signed)
PATIENT NAME: Jimmy [name] DOB: [birth year] MRN: [id number]  PRIMARY CARE PROVIDER: Ria Bush, MD  RESPONSIBLE PARTY:  Acct ID - Guarantor Home Phone Work Phone Relationship Acct Type  [id number] Carmelina [phone]  Self P/F     [address], [place], [place] [address]    PLAN OF CARE and INTERVENTIONS:               1.  GOALS OF CARE/ ADVANCE CARE PLANNING:  Patient wants to get stronger and be able to move better.                  2.  PATIENT/CAREGIVER EDUCATION:  Education on fall precautions, education on s/s of infection, reviewed meds, support               3.  DISEASE STATUS:RN made scheduled palliative care home visit. Nurse met with patient and patients wife Jimmy [name]. Patient was referred to palliative care by Dr. Danise Mina. Patient's past medical history includes but not limited to leukemia that wife reports is 99% cured, hypertension, carotid artery stenosis, aortic stenosis, coronary atherosclerosis, history of TIA's, GERD, dysphagia, constipation, mild cognitive impairment, chronic kidney disease stage 3, genital herpes, hyperlipidemia, vitamin D deficiency, left-sided weakness, nocturia, thrombocytopenia and slurred speech at times. Patient is alert and oriented. Patient denies having any pain at the present time. Patient has left-sided weakness and  requires max assist. Patient cannot go from sitting to transfer to wheelchair and requires assist from wife.   Patient denies having any shortness of breath but reports he has a productive cough of white sputum. Wife reports patient appetite is fair.  Patients current weight 150 pounds. Patient has RX for Marinol to increase appetite. Patient receiving PT Home Health through [facility] 2 times per week. Patient suffered a TIA 3 weeks ago per wife. Patient also slid down from chair and wife had to call EMTs to get patient up. Patient vital signs are stable. Patient reports he has been sleeping well. Patient has no open areas  of skin breakdown. Wife reports patient uses urinal that night as she has difficulty getting patient up.   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 Wife would like to discuss Aid and attendant services to see if patient may qualify. Nurse will request social worker talk with wife about aid and attendance service. Patient has a DNR in the Home. Patient has hospital bed, shower chair, transfer wheelchair, gait belt and other assisted devices in the home. Nurse reviewed patient's medications with wife. Patient is not currently getting treatment for leukemia. Patient is under observation for leukemia. Patient and wife in agreement with palliative care services. Patient and wife encouraged to contact palliative care with questions or concerns.      HISTORY OF PRESENT ILLNESS:  Patient is a 86 year old male who resides in home with his wife Jimmy [name].  Patient and wife open to palliative care services.  Patient will be seen monthly and PRN by palliative care team.  CODE STATUS: DNR  ADVANCED DIRECTIVES: Y MOST FORM: No PPS: 40%   PHYSICAL EXAM:   VITALS: Today's Vitals   06/18/19 1333 06/18/19 1345  BP:  (!) 142/70  Pulse:  72  Resp: 16 16  Temp:  97.8 F (36.6 C)  TempSrc:  Temporal  SpO2:  97%  Weight: 150 lb (68 kg) 150 lb (68 kg)  Height: 5' 9"  (1.753 m) 5' 9"  (1.753 m)  PainSc: 0-No pain 0-No  pain    LUNGS: clear to auscultation  CARDIAC: Cor RRR  EXTREMITIES: none edema SKIN: no visible open areas of skin breakdown.  NEURO: positive for gait problems, memory problems and weakness       Nilda Simmer, RN

## 2019-06-21 DIAGNOSIS — Z7982 Long term (current) use of aspirin: Secondary | ICD-10-CM | POA: Diagnosis not present

## 2019-06-21 DIAGNOSIS — I69391 Dysphagia following cerebral infarction: Secondary | ICD-10-CM | POA: Diagnosis not present

## 2019-06-21 DIAGNOSIS — Z79899 Other long term (current) drug therapy: Secondary | ICD-10-CM | POA: Diagnosis not present

## 2019-06-21 DIAGNOSIS — Z8679 Personal history of other diseases of the circulatory system: Secondary | ICD-10-CM | POA: Diagnosis not present

## 2019-06-21 DIAGNOSIS — I5032 Chronic diastolic (congestive) heart failure: Secondary | ICD-10-CM | POA: Diagnosis not present

## 2019-06-21 DIAGNOSIS — I130 Hypertensive heart and chronic kidney disease with heart failure and stage 1 through stage 4 chronic kidney disease, or unspecified chronic kidney disease: Secondary | ICD-10-CM | POA: Diagnosis not present

## 2019-06-21 DIAGNOSIS — D696 Thrombocytopenia, unspecified: Secondary | ICD-10-CM | POA: Diagnosis not present

## 2019-06-21 DIAGNOSIS — C9111 Chronic lymphocytic leukemia of B-cell type in remission: Secondary | ICD-10-CM | POA: Diagnosis not present

## 2019-06-21 DIAGNOSIS — N183 Chronic kidney disease, stage 3 unspecified: Secondary | ICD-10-CM | POA: Diagnosis not present

## 2019-06-21 DIAGNOSIS — I2510 Atherosclerotic heart disease of native coronary artery without angina pectoris: Secondary | ICD-10-CM | POA: Diagnosis not present

## 2019-06-21 DIAGNOSIS — I69354 Hemiplegia and hemiparesis following cerebral infarction affecting left non-dominant side: Secondary | ICD-10-CM | POA: Diagnosis not present

## 2019-06-21 DIAGNOSIS — R1310 Dysphagia, unspecified: Secondary | ICD-10-CM | POA: Diagnosis not present

## 2019-06-21 DIAGNOSIS — Z951 Presence of aortocoronary bypass graft: Secondary | ICD-10-CM | POA: Diagnosis not present

## 2019-06-22 DIAGNOSIS — I69354 Hemiplegia and hemiparesis following cerebral infarction affecting left non-dominant side: Secondary | ICD-10-CM | POA: Diagnosis not present

## 2019-06-22 DIAGNOSIS — N183 Chronic kidney disease, stage 3 unspecified: Secondary | ICD-10-CM | POA: Diagnosis not present

## 2019-06-22 DIAGNOSIS — C9111 Chronic lymphocytic leukemia of B-cell type in remission: Secondary | ICD-10-CM | POA: Diagnosis not present

## 2019-06-22 DIAGNOSIS — I130 Hypertensive heart and chronic kidney disease with heart failure and stage 1 through stage 4 chronic kidney disease, or unspecified chronic kidney disease: Secondary | ICD-10-CM | POA: Diagnosis not present

## 2019-06-22 DIAGNOSIS — I5032 Chronic diastolic (congestive) heart failure: Secondary | ICD-10-CM | POA: Diagnosis not present

## 2019-06-22 DIAGNOSIS — I2510 Atherosclerotic heart disease of native coronary artery without angina pectoris: Secondary | ICD-10-CM | POA: Diagnosis not present

## 2019-06-23 ENCOUNTER — Encounter: Payer: Self-pay | Admitting: Internal Medicine

## 2019-06-23 ENCOUNTER — Ambulatory Visit (INDEPENDENT_AMBULATORY_CARE_PROVIDER_SITE_OTHER): Payer: Medicare Other | Admitting: Internal Medicine

## 2019-06-23 VITALS — BP 114/66 | HR 72 | Temp 98.2°F | Ht 69.5 in

## 2019-06-23 DIAGNOSIS — I6521 Occlusion and stenosis of right carotid artery: Secondary | ICD-10-CM | POA: Diagnosis not present

## 2019-06-23 DIAGNOSIS — K5909 Other constipation: Secondary | ICD-10-CM

## 2019-06-23 DIAGNOSIS — K5641 Fecal impaction: Secondary | ICD-10-CM | POA: Diagnosis not present

## 2019-06-23 DIAGNOSIS — I6930 Unspecified sequelae of cerebral infarction: Secondary | ICD-10-CM | POA: Diagnosis not present

## 2019-06-23 NOTE — Progress Notes (Signed)
Jimmy [name] 86 y.o. September 21, 1928 [id number]  Assessment & Plan:   Encounter Diagnoses  Name Primary?  . Fecal impaction ([place]) Yes  . Chronic constipation with overflow incontinence   . History of stroke with residual deficit     We will purge with Plenvu samples and then daily MiraLAX.  If this fails to work he may need intermittent enemas, addition of stimulant laxatives.  I have explained to the patient and his wife that the impaction is the problem and diarrhea occurs in this setting due to overflow around the impaction.  I will see him back as needed.   Note that he has considerable difficulty with ambulation and staff members had to help him get into his car and apparently the [place] man helped his wife get him out of the car.  Question if he needs more assistance or she needs more assistance in his care.  I know primary care is aware of this difficult situation.  Cc;Gutierrez, Garlon Hatchet, MD   Subjective:   Chief Complaint: Constipation  HPI 86 year old white man here with his wife, having constipation difficulty exacerbated by 2 strokes in 2020, the for stroke was in January and then in May he had another stroke and has had limited mobility since then.  MiraLAX has been used but not consistently, was trying to take it every other day his wife says sometimes she does not remember to give it to them.  Lately he has been actually having some diarrhea but at baseline goes for days without significant defecation and has lower abdominal discomfort.  2 view abdomen in November 2020 showed a large stool burden. Allergies  Allergen Reactions  . New Skin Other (See Comments)    unknown  . Tape Dermatitis   Current Meds  Medication Sig  . acetaminophen (TYLENOL) 325 MG tablet Take 2 tablets (650 mg total) by mouth every 4 (four) hours as needed for mild pain (or temp > 37.5 C (99.5 F)).  [name] allopurinol (ZYLOPRIM) 300 MG tablet TAKE 1 TABLET DAILY  . aspirin EC 325 MG EC tablet Take 1  tablet (325 mg total) by mouth daily.  . cholecalciferol (VITAMIN D) 1000 units tablet Take 2,000 Units by mouth daily.  . diphenhydrAMINE (BENADRYL ALLERGY) 25 MG tablet Take 1 tablet (25 mg total) by mouth at bedtime as needed for sleep.  [name] lisinopril (ZESTRIL) 5 MG tablet TAKE 1 TABLET DAILY (NEW DOSE)  . nitroGLYCERIN (NITROSTAT) 0.4 MG SL tablet Place 1 tablet (0.4 mg total) under the tongue every 5 (five) minutes as needed for chest pain (max 3 doses in 15 minutes).  . simvastatin (ZOCOR) 10 MG tablet TAKE 1 TABLET AT BEDTIME  . valACYclovir (VALTREX) 500 MG tablet Take 1 tablet (500 mg total) by mouth daily.  . vitamin B-12 (CYANOCOBALAMIN) 1000 MCG tablet Take 1 tablet (1,000 mcg total) by mouth daily.   Past Medical History:  Diagnosis Date  . Actinic keratosis   . Basal cell carcinoma 02/15/2016   Right lateral neck infraauricular of SCC scar  . Basal cell carcinoma 02/15/2016   Left chin  . Basal cell carcinoma 03/08/2014   Right of midline forehead  . CAD (coronary artery disease)    a. 04/2001 S/P CABGx 4;  b. 2008 MV:  EF 63% normal perfusion.  . Carotid stenosis    a. 11/2012 Carotid U/S:  RICA [id number], LICA 123456.  . Chronic kidney disease, stage 3   . CLL (chronic lymphoblastic leukemia) 2009  Stage IV; Dr. Ivor Messier - referred to Dr. Lissa [facility] 07/2013 now on Rituxan (10/2013) --> stage 0 (09/2014)  . Diastolic CHF ([place])    a. [id number] Echo: EF 55-65%, mild conc LVH, Gr 1 DD, mild AS, triv AI, mildly dil Ao root (3.5 cm).  . GERD (gastroesophageal reflux disease) 1990s  . History of CVA (cerebrovascular accident) 2015   by MRI - remote L internal capsule  . History of herpes genitalis    valtrex daily  . Hyperlipemia 2002  . Hypertension 2002  . Mass of submandibular region 2015   referred to gen surg after chemo  . Mild aortic stenosis    a. 07/2011 Echo: Mild AS, triv AI.  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 Skin cancer   . Squamous  cell carcinoma of skin 10/05/2014   Right mid posterior ear  . Stroke ([place])   . Thrombocytopenia ([place])    outpatient goals Hgb >9, plt >20  . TIA (transient ischemic attack)    04/2016   Past Surgical History:  Procedure Laterality Date  . ANGIOPLASTY  1998  . CATARACT EXTRACTION, BILATERAL     R 1/09, L 8/09  . COLONOSCOPY  11/29/1987   Normal  . COLONOSCOPY  02/07/2003   Hemm. Internal, focal proctitis, negative biopsy  . COLONOSCOPY  12/12/2004   Internal external hemorrhoids, +proctits, negative biopsy  . CORONARY ANGIOPLASTY  1998  . CORONARY ARTERY BYPASS GRAFT  04/23/2001   x4, Dr. Pia Mau  . ESOPHAGOGASTRODUODENOSCOPY  11/29/1987   Gastritis  . ETT  12/10/2006   Persantine myoview nml  . HEMORROIDECTOMY  1954   Fissure repair, [place]  . HEPATIC ARTERY ANGIOPLASTY  1954   [place], [place]  . KIDNEY STONE SURGERY  1977   Dr Redmond Baseman  . LITHOTRIPSY  1990s   Multiple  . US ECHOCARDIOGRAPHY  07/2011   nl sys fxn, EF 55-60%, grade 1 diast dysfunction, mild AS, mildly elevated PA pressure   Social History   Social History Narrative   Married and lives with wife   1 son died of lung cancer at 42 years old   1 daughter bipolar, lives    Activity: walks 1 mi daily on treadmill    Diet: good water, some fruits/vegetables    family history includes Alcohol abuse in his brother; Depression in his daughter; Diabetes in his brother and another family member; Heart attack in his brother; Heart disease in his mother; Hyperlipidemia in his father and mother; Hypertension in his father and mother; Kidney cancer in his sister; Stroke in his brother.   Review of Systems As above Limited ambulation, after stroke patient in wheelchair is requiring significant care from his wife, some dyspnea and hearing problems back pain vision changes and confusion and cough reported.  Objective:   Physical Exam BP 114/66   Pulse 72   Temp 98.2 F (36.8 C)   Ht 5' 9.5" (1.765 m)   BMI 21.83  kg/m  Elderly white man in a wheelchair with a left hemiparesis Rectal exam shows a large fecal impaction stool is soft/formed not hard

## 2019-06-23 NOTE — Patient Instructions (Addendum)
  Please use the Plenvu bowel prep kit you have been given today to cleanse out his bowels. Use over a 4 hour period as instructed.   After the purge take a dose of Miralax daily.    I appreciate the opportunity to care for you. Silvano Rusk, MD, [facility]

## 2019-06-24 ENCOUNTER — Ambulatory Visit: Payer: Medicare Other | Admitting: Dermatology

## 2019-06-25 DIAGNOSIS — I69354 Hemiplegia and hemiparesis following cerebral infarction affecting left non-dominant side: Secondary | ICD-10-CM | POA: Diagnosis not present

## 2019-06-25 DIAGNOSIS — C9111 Chronic lymphocytic leukemia of B-cell type in remission: Secondary | ICD-10-CM | POA: Diagnosis not present

## 2019-06-25 DIAGNOSIS — N183 Chronic kidney disease, stage 3 unspecified: Secondary | ICD-10-CM | POA: Diagnosis not present

## 2019-06-25 DIAGNOSIS — I130 Hypertensive heart and chronic kidney disease with heart failure and stage 1 through stage 4 chronic kidney disease, or unspecified chronic kidney disease: Secondary | ICD-10-CM | POA: Diagnosis not present

## 2019-06-25 DIAGNOSIS — I2510 Atherosclerotic heart disease of native coronary artery without angina pectoris: Secondary | ICD-10-CM | POA: Diagnosis not present

## 2019-06-25 DIAGNOSIS — I5032 Chronic diastolic (congestive) heart failure: Secondary | ICD-10-CM | POA: Diagnosis not present

## 2019-06-28 ENCOUNTER — Other Ambulatory Visit: Payer: Self-pay

## 2019-06-28 ENCOUNTER — Ambulatory Visit (INDEPENDENT_AMBULATORY_CARE_PROVIDER_SITE_OTHER): Payer: Medicare Other | Admitting: Dermatology

## 2019-06-28 DIAGNOSIS — L570 Actinic keratosis: Secondary | ICD-10-CM

## 2019-06-28 DIAGNOSIS — L821 Other seborrheic keratosis: Secondary | ICD-10-CM

## 2019-06-28 DIAGNOSIS — L820 Inflamed seborrheic keratosis: Secondary | ICD-10-CM

## 2019-06-28 DIAGNOSIS — L578 Other skin changes due to chronic exposure to nonionizing radiation: Secondary | ICD-10-CM | POA: Diagnosis not present

## 2019-06-28 DIAGNOSIS — C4499 Other specified malignant neoplasm of skin, unspecified: Secondary | ICD-10-CM

## 2019-06-28 DIAGNOSIS — I6521 Occlusion and stenosis of right carotid artery: Secondary | ICD-10-CM | POA: Diagnosis not present

## 2019-06-28 NOTE — Progress Notes (Signed)
   Follow-Up Visit   Subjective  Jimmy [name] is a 86 y.o. male who presents for the following: Follow-up (Inflamed SK follow up - scalp. 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  He has history of precancerous actinic keratosis of the face and scalp and history of skin cancer treated in the past.  Patient had a recent TIA since his last visit here and has been more confused lately according to wife.  The patient's wife is present with him and answers all his questions today..  The following portions of the chart were reviewed this encounter and updated as appropriate:     Review of Systems: No other skin or systemic complaints.  Objective  Well appearing patient in no apparent distress; mood and affect are within normal limits.  A focused examination was performed including face, scalp. Relevant physical exam findings are noted in the Assessment and Plan.  Objective  Scalp (19): Erythematous thin papules/macules with gritty scale.   Objective  Face and scalp: Actinic changes  Objective  Scalp (7): Erythematous keratotic or waxy stuck-on papule or plaque.   Objective  Right Ant Nasal Ala: Healing biopsy site.  Objective  Scalp: Stuck-on, waxy, tan-brown papule or plaque --Discussed benign etiology and prognosis.   Assessment & Plan  AK (actinic keratosis) (19) Scalp  Destruction of lesion - Scalp Complexity: simple   Destruction method: cryotherapy   Informed consent: discussed and consent obtained   Timeout:  patient name, date of birth, surgical site, and procedure verified Lesion destroyed using liquid nitrogen: Yes   Region frozen until ice ball extended beyond lesion: Yes   Outcome: patient tolerated procedure well with no complications   Post-procedure details: wound care instructions given    Actinic skin damage Face and scalp  Recommend broad spectrum SPF and  photoprotection    Inflamed seborrheic keratosis (7) Scalp  Destruction of lesion - Scalp Complexity: simple   Destruction method: cryotherapy   Informed consent: discussed and consent obtained   Timeout:  patient name, date of birth, surgical site, and procedure verified Lesion destroyed using liquid nitrogen: Yes   Region frozen until ice ball extended beyond lesion: Yes   Outcome: patient tolerated procedure well with no complications   Post-procedure details: wound care instructions given    Sebaceous carcinoma Right Ant Nasal Ala  Patient is scheduled for [facility] surgery 07/21/19.  Seborrheic keratosis Scalp  Observe   Return in about 3 months (around 09/28/2019).   I, Ashok Cordia, CMA, am acting as scribe for Sarina Ser, MD .

## 2019-06-28 NOTE — Patient Instructions (Signed)

## 2019-06-29 ENCOUNTER — Telehealth: Payer: Self-pay

## 2019-06-29 DIAGNOSIS — I130 Hypertensive heart and chronic kidney disease with heart failure and stage 1 through stage 4 chronic kidney disease, or unspecified chronic kidney disease: Secondary | ICD-10-CM | POA: Diagnosis not present

## 2019-06-29 DIAGNOSIS — I69354 Hemiplegia and hemiparesis following cerebral infarction affecting left non-dominant side: Secondary | ICD-10-CM | POA: Diagnosis not present

## 2019-06-29 DIAGNOSIS — C9111 Chronic lymphocytic leukemia of B-cell type in remission: Secondary | ICD-10-CM | POA: Diagnosis not present

## 2019-06-29 DIAGNOSIS — I5032 Chronic diastolic (congestive) heart failure: Secondary | ICD-10-CM | POA: Diagnosis not present

## 2019-06-29 DIAGNOSIS — I2510 Atherosclerotic heart disease of native coronary artery without angina pectoris: Secondary | ICD-10-CM | POA: Diagnosis not present

## 2019-06-29 DIAGNOSIS — N183 Chronic kidney disease, stage 3 unspecified: Secondary | ICD-10-CM | POA: Diagnosis not present

## 2019-06-29 NOTE — Telephone Encounter (Signed)
Mrs Ritsema said pt either had TIA or seizure on 06/23/19; pt did not want [name] to call EMS;pt did not go anywhere to be evaluated on 06/23/19 pt did not know where he was or who Mrs Mcgilvery was. Pt was seeing people in the room that was not there. Then pt went to sleep for 2 hours and pt was still confused. Now [facility] PT is with pt; pt is still confused and Mrs Franczak wants to know what to do. Pt is sleeping a lot; No H/A, no dizziness, no CP or SOB; pt has  weakness in lt arm and leg;but weakness is worse than usual. HH PT said today BP 144/76 P 78 R 18 T 97.6. Pt has no covid symptoms except a dry cough pt has had since pt had stroke Jan 2020., no travel and no known exposure to + covid. Mrs Moriyama said pt does not want to go to ED and Dr Darnell Level said OK to schedule pt in office. Offered appt on 06/30/19 but Mrs Peloso wants pt to see Dr Darnell Level only. scheduled 30' appt on 07/01/19 at 10:45. ED precautions given and Mrs Mergel voiced understanding.

## 2019-06-29 NOTE — Telephone Encounter (Signed)
Noted. They declined ER eval.

## 2019-07-01 ENCOUNTER — Ambulatory Visit (INDEPENDENT_AMBULATORY_CARE_PROVIDER_SITE_OTHER): Payer: Medicare Other | Admitting: Family Medicine

## 2019-07-01 ENCOUNTER — Other Ambulatory Visit: Payer: Self-pay

## 2019-07-01 ENCOUNTER — Encounter: Payer: Self-pay | Admitting: Family Medicine

## 2019-07-01 VITALS — BP 122/78 | HR 66 | Temp 97.7°F | Ht 69.5 in | Wt 150.6 lb

## 2019-07-01 DIAGNOSIS — R410 Disorientation, unspecified: Secondary | ICD-10-CM | POA: Diagnosis not present

## 2019-07-01 DIAGNOSIS — I69359 Hemiplegia and hemiparesis following cerebral infarction affecting unspecified side: Secondary | ICD-10-CM

## 2019-07-01 DIAGNOSIS — K5909 Other constipation: Secondary | ICD-10-CM

## 2019-07-01 DIAGNOSIS — Z515 Encounter for palliative care: Secondary | ICD-10-CM

## 2019-07-01 DIAGNOSIS — E782 Mixed hyperlipidemia: Secondary | ICD-10-CM

## 2019-07-01 DIAGNOSIS — I2510 Atherosclerotic heart disease of native coronary artery without angina pectoris: Secondary | ICD-10-CM | POA: Diagnosis not present

## 2019-07-01 DIAGNOSIS — R441 Visual hallucinations: Secondary | ICD-10-CM | POA: Diagnosis not present

## 2019-07-01 DIAGNOSIS — K5641 Fecal impaction: Secondary | ICD-10-CM

## 2019-07-01 DIAGNOSIS — G3184 Mild cognitive impairment, so stated: Secondary | ICD-10-CM

## 2019-07-01 DIAGNOSIS — G459 Transient cerebral ischemic attack, unspecified: Secondary | ICD-10-CM

## 2019-07-01 DIAGNOSIS — I130 Hypertensive heart and chronic kidney disease with heart failure and stage 1 through stage 4 chronic kidney disease, or unspecified chronic kidney disease: Secondary | ICD-10-CM | POA: Diagnosis not present

## 2019-07-01 DIAGNOSIS — I69354 Hemiplegia and hemiparesis following cerebral infarction affecting left non-dominant side: Secondary | ICD-10-CM | POA: Diagnosis not present

## 2019-07-01 DIAGNOSIS — C9111 Chronic lymphocytic leukemia of B-cell type in remission: Secondary | ICD-10-CM | POA: Diagnosis not present

## 2019-07-01 DIAGNOSIS — I6521 Occlusion and stenosis of right carotid artery: Secondary | ICD-10-CM

## 2019-07-01 DIAGNOSIS — I69391 Dysphagia following cerebral infarction: Secondary | ICD-10-CM | POA: Diagnosis not present

## 2019-07-01 DIAGNOSIS — I6930 Unspecified sequelae of cerebral infarction: Secondary | ICD-10-CM | POA: Diagnosis not present

## 2019-07-01 DIAGNOSIS — N183 Chronic kidney disease, stage 3 unspecified: Secondary | ICD-10-CM | POA: Diagnosis not present

## 2019-07-01 DIAGNOSIS — I5032 Chronic diastolic (congestive) heart failure: Secondary | ICD-10-CM | POA: Diagnosis not present

## 2019-07-01 MED ORDER — SIMVASTATIN 20 MG PO TABS: 20.0000 mg | ORAL_TABLET | Freq: Every day | ORAL | 1 refills | Status: DC

## 2019-07-01 NOTE — Progress Notes (Signed)
This visit was conducted in person.  BP 122/78 (BP Location: Right Arm, Patient Position: Sitting, Cuff Size: Normal)   Pulse 66   Temp 97.7 F (36.5 C) (Temporal)   Ht 5' 9.5" (1.765 m)   Wt 150 lb 9 oz (68.3 kg)   SpO2 97%   BMI 21.92 kg/m    CC: memory change, worsening weakness Subjective:    [name], male    DOB: 10-May-1928, 86 y.o.   MRN: [id number]  HPI: EDGEL VI is a 86 y.o. male presenting on 07/01/2019 for Altered Mental Status (Per pt's wife, pt had TIA on 06/23/19 that lasted for 1.5. Did not give any meds and did not call EMS.  Pt was confused and still is.  Pt accompanied by wife, [name]- temp 98.0.)   Here today in transport chair - their lawn mower man Coralyn Mark helps get him in and out of the car.   Known recurrent strokes (L MCA territory anterior ischemic infarct involved L frontal subcortical white matter, h/o multiple previous chronic infarcts, h/o chronic R basal ganglia hemorrhage), latest presumed earlier 2021 due to increased level of care needs noted afterwards. Aricept started 04/2019 for new visual hallucinations. Last brain imaging done 07/2018. Currently on aspirin 325mg  and simvastatin 10mg  daily.   Concern for rpt TIA 06/23/2019 - that morning when he woke up was much more confused than normal, with worsened visual hallucinations. Wife called our office on 06/29/2019, declined ER evaluation. Here today for further evaluation. 3 separate episodes this past month with progressive loss of function noted each time. Wife describes episodes lasting 73min - 1.5 hours where he stares into distance, is less responsive and can answer questions wrong. Sometimes associated with visual hallucinations. Each time he comes out of episode, he is more confused, especially worse after last episode. Patient denies any noted trouble. He does note increased weakness - now unable to walk.   Denies headache, vision changes, dizziness, chest pain, dyspnea.  Known  chronic L hemiparesis, weakness is worse than normal.   Recent fecal impaction saw GI Carlean Purl) s/p Plenvu purge then daily miralax to prevent recurrence.   Palliative care involved - seeing if he qualifies for aid and attendant service. 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   HHPT involved - full assist for ambulation, recently neurology provided them with Rx for AFO.      Relevant past medical, surgical, family and social history reviewed and updated as indicated. Interim medical history since our last visit reviewed. Allergies and medications reviewed and updated. Outpatient Medications Prior to Visit  Medication Sig Dispense Refill  . acetaminophen (TYLENOL) 325 MG tablet Take 2 tablets (650 mg total) by mouth every 4 (four) hours as needed for mild pain (or temp > 37.5 C (99.5 F)).    [name] allopurinol (ZYLOPRIM) 300 MG tablet TAKE 1 TABLET DAILY 90 tablet 3  . aspirin EC 325 MG EC tablet Take 1 tablet (325 mg total) by mouth daily. 180 tablet 0  . cholecalciferol (VITAMIN D) 1000 units tablet Take 2,000 Units by mouth daily.    . diphenhydrAMINE (BENADRYL ALLERGY) 25 MG tablet Take 1 tablet (25 mg total) by mouth at bedtime as needed for sleep.    [name] lisinopril (ZESTRIL) 5 MG tablet TAKE 1 TABLET DAILY (NEW DOSE) 90 tablet 3  . nitroGLYCERIN (NITROSTAT) 0.4 MG SL tablet Place 1 tablet (0.4 mg total) under the tongue every 5 (five) minutes as needed for chest  pain (max 3 doses in 15 minutes). 25 tablet 3  . polyethylene glycol powder (GLYCOLAX/MIRALAX) powder Take 17 g by mouth every other day.     . valACYclovir (VALTREX) 500 MG tablet Take 1 tablet (500 mg total) by mouth daily. 90 tablet 1  . vitamin B-12 (CYANOCOBALAMIN) 1000 MCG tablet Take 1 tablet (1,000 mcg total) by mouth daily.    . simvastatin (ZOCOR) 10 MG tablet TAKE 1 TABLET AT BEDTIME 90 tablet 3   No facility-administered medications prior to visit.     Per HPI unless specifically indicated in ROS section  below Review of Systems Objective:    BP 122/78 (BP Location: Right Arm, Patient Position: Sitting, Cuff Size: Normal)   Pulse 66   Temp 97.7 F (36.5 C) (Temporal)   Ht 5' 9.5" (1.765 m)   Wt 150 lb 9 oz (68.3 kg)   SpO2 97%   BMI 21.92 kg/m   Wt Readings from Last 3 Encounters:  07/01/19 150 lb 9 oz (68.3 kg)  06/18/19 150 lb (68 kg)  06/08/19 152 lb (68.9 kg)    Physical Exam Vitals and nursing note reviewed.  Constitutional:      Appearance: Normal appearance. He is not ill-appearing.     Comments: Sitting in wheelchair  HENT:     Head: Normocephalic and atraumatic.  Cardiovascular:     Rate and Rhythm: Normal rate and regular rhythm.     Pulses: Normal pulses.     Heart sounds: Normal heart sounds. No murmur.  Pulmonary:     Effort: Pulmonary effort is normal. No respiratory distress.     Breath sounds: Normal breath sounds. No wheezing, rhonchi or rales.  Musculoskeletal:     Right lower leg: No edema.     Left lower leg: No edema.  Skin:    General: Skin is warm.  Neurological:     Mental Status: He is alert. Mental status is at baseline.     Sensory: Sensation is intact.     Motor: Weakness (L hemiparesis) present.     Comments:  A&Ox2, states today's date is 07/01/2018, [facility]  Grip strength diminished on left        Lab Results  Component Value Date   CHOL 118 08/28/2018   HDL 35 (L) 08/28/2018   LDLCALC 56 08/28/2018   TRIG 135 08/28/2018   CHOLHDL 3.4 08/28/2018    Assessment & Plan:  This visit occurred during the SARS-CoV-2 public health emergency.  Safety protocols were in place, including screening questions prior to the visit, additional usage of staff PPE, and extensive cleaning of exam room while observing appropriate contact time as indicated for disinfecting solutions.   Problem List Items Addressed This Visit    Visual hallucinations   Relevant Orders   MR Brain Wo Contrast   TIA (transient ischemic attack) - Primary     Concern for recurrent TIAs with progressive L hemiparesis, associated with visual hallucinations and increased confusion endorsed by wife. Check MRI and refer back to neurology to r/o seizures.       Relevant Medications   simvastatin (ZOCOR) 20 MG tablet   Other Relevant Orders   Ambulatory referral to Neurology   VAS US CAROTID   MR Brain Wo Contrast   Palliative care patient    Appreciate palliative care assistance.  Pending eval for extra assistance through [place].       Mixed hyperlipidemia    In setting of recent episodes, will increase simvastatin to  20mg  daily.  The ASCVD Risk score Mikey Bussing DC Jr., et al., 2013) failed to calculate for the following reasons:   The 2013 ASCVD risk score is only valid for ages 34 to 17   The patient has a prior MI or stroke diagnosis       Relevant Medications   simvastatin (ZOCOR) 20 MG tablet   MCI (mild cognitive impairment) with memory loss   Relevant Orders   Ambulatory referral to Neurology   History of stroke with residual deficit   Relevant Orders   Ambulatory referral to Neurology   MR Brain Wo Contrast   History of hemorrhagic stroke with residual hemiparesis (HCC)    Concern for recurrent strokes with episodes of acute worsening functional status noted by wife, r/o seizure. Will check MRI and refer sooner back to neurology.       Relevant Orders   Ambulatory referral to Neurology   Fecal impaction [facility])    Appreciate GI care. Managing with daily miralax, s/p Plenvu purge.       Dysphagia, post-stroke   Chronic constipation with overflow incontinence    Appreciate GI care.       Carotid artery stenosis    Rpt carotid US scheduled for 07/2019 - will update carotid US sooner with recent symptoms.       Relevant Medications   simvastatin (ZOCOR) 20 MG tablet   Other Relevant Orders   Ambulatory referral to Neurology   VAS US CAROTID    Other Visit Diagnoses    Disorientation       Relevant Orders   MR Brain Wo Contrast        Meds ordered this encounter  Medications  . simvastatin (ZOCOR) 20 MG tablet    Sig: Take 1 tablet (20 mg total) by mouth at bedtime.    Dispense:  90 tablet    Refill:  1    Note increased dose   Orders Placed This Encounter  Procedures  . MR Brain Wo Contrast    Standing Status:   Future    Standing Expiration Date:   09/01/2020    Order Specific Question:   ** REASON FOR EXAM (FREE TEXT)    Answer:   episodes of unresponsiveness, hallucinations, worsening weakness, in h/o strokes    Order Specific Question:   What is the patient's sedation requirement?    Answer:   No Sedation    Order Specific Question:   Does the patient have a pacemaker or implanted devices?    Answer:   No    Order Specific Question:   Preferred imaging location?    Answer:   [facility] (table limit-400lbs)    Order Specific Question:   Radiology Contrast Protocol - do NOT remove file path    Answer:   [email or web address]  . Ambulatory referral to Neurology    Referral Priority:   Routine    Referral Type:   Consultation    Referral Reason:   Specialty Services Required    Requested Specialty:   Neurology    Number of Visits Requested:   1    Patient Instructions  We will bump up cholesterol medicine dose to zocor 20mg  daily.  I want you to see neurology sooner than June - we will get this scheduled.  Repeat brain MRI as well  We will call you with these appointments.    Follow up plan: Return if symptoms worsen or fail to improve.  Ria Bush, MD

## 2019-07-01 NOTE — Patient Instructions (Addendum)
We will bump up cholesterol medicine dose to zocor 20mg  daily.  I want you to see neurology sooner than June - we will get this scheduled.  Repeat brain MRI as well  We will call you with these appointments.

## 2019-07-03 ENCOUNTER — Encounter: Payer: Self-pay | Admitting: Family Medicine

## 2019-07-03 DIAGNOSIS — Z515 Encounter for palliative care: Secondary | ICD-10-CM | POA: Insufficient documentation

## 2019-07-03 DIAGNOSIS — R441 Visual hallucinations: Secondary | ICD-10-CM | POA: Insufficient documentation

## 2019-07-03 NOTE — Assessment & Plan Note (Signed)
Concern for recurrent TIAs with progressive L hemiparesis, associated with visual hallucinations and increased confusion endorsed by wife. Check MRI and refer back to neurology to r/o seizures.

## 2019-07-03 NOTE — Assessment & Plan Note (Addendum)
Appreciate GI care. Managing with daily miralax, s/p Plenvu purge.

## 2019-07-03 NOTE — Assessment & Plan Note (Signed)
Concern for recurrent strokes with episodes of acute worsening functional status noted by wife, r/o seizure. Will check MRI and refer sooner back to neurology.

## 2019-07-03 NOTE — Assessment & Plan Note (Signed)
Appreciate palliative care assistance.  Pending eval for extra assistance through [place].

## 2019-07-03 NOTE — Assessment & Plan Note (Signed)
In setting of recent episodes, will increase simvastatin to 20mg  daily.  The ASCVD Risk score Mikey Bussing DC Jr., et al., 2013) failed to calculate for the following reasons:   The 2013 ASCVD risk score is only valid for ages 5 to 96   The patient has a prior MI or stroke diagnosis

## 2019-07-03 NOTE — Assessment & Plan Note (Signed)
Rpt carotid US scheduled for 07/2019 - will update carotid US sooner with recent symptoms.

## 2019-07-03 NOTE — Assessment & Plan Note (Signed)
Appreciate GI care.  

## 2019-07-06 ENCOUNTER — Telehealth: Payer: Self-pay

## 2019-07-06 DIAGNOSIS — N183 Chronic kidney disease, stage 3 unspecified: Secondary | ICD-10-CM | POA: Diagnosis not present

## 2019-07-06 DIAGNOSIS — I69354 Hemiplegia and hemiparesis following cerebral infarction affecting left non-dominant side: Secondary | ICD-10-CM | POA: Diagnosis not present

## 2019-07-06 DIAGNOSIS — I130 Hypertensive heart and chronic kidney disease with heart failure and stage 1 through stage 4 chronic kidney disease, or unspecified chronic kidney disease: Secondary | ICD-10-CM | POA: Diagnosis not present

## 2019-07-06 DIAGNOSIS — I2510 Atherosclerotic heart disease of native coronary artery without angina pectoris: Secondary | ICD-10-CM | POA: Diagnosis not present

## 2019-07-06 DIAGNOSIS — I5032 Chronic diastolic (congestive) heart failure: Secondary | ICD-10-CM | POA: Diagnosis not present

## 2019-07-06 DIAGNOSIS — C9111 Chronic lymphocytic leukemia of B-cell type in remission: Secondary | ICD-10-CM | POA: Diagnosis not present

## 2019-07-06 NOTE — Telephone Encounter (Signed)
Jimmy [name] with [facility] left v/m that pt fell while transferring from toilet to transfer chair on 07/05/19 with no apparent injury; no abrasions or contusions. Neighbor helped pt up. This is FYI only to Dr Danise Mina due to pt falling.

## 2019-07-06 NOTE — Telephone Encounter (Signed)
Noted  

## 2019-07-08 DIAGNOSIS — I69354 Hemiplegia and hemiparesis following cerebral infarction affecting left non-dominant side: Secondary | ICD-10-CM | POA: Diagnosis not present

## 2019-07-08 DIAGNOSIS — C9111 Chronic lymphocytic leukemia of B-cell type in remission: Secondary | ICD-10-CM | POA: Diagnosis not present

## 2019-07-08 DIAGNOSIS — I5032 Chronic diastolic (congestive) heart failure: Secondary | ICD-10-CM | POA: Diagnosis not present

## 2019-07-08 DIAGNOSIS — I130 Hypertensive heart and chronic kidney disease with heart failure and stage 1 through stage 4 chronic kidney disease, or unspecified chronic kidney disease: Secondary | ICD-10-CM | POA: Diagnosis not present

## 2019-07-08 DIAGNOSIS — I2510 Atherosclerotic heart disease of native coronary artery without angina pectoris: Secondary | ICD-10-CM | POA: Diagnosis not present

## 2019-07-08 DIAGNOSIS — N183 Chronic kidney disease, stage 3 unspecified: Secondary | ICD-10-CM | POA: Diagnosis not present

## 2019-07-12 ENCOUNTER — Other Ambulatory Visit: Payer: Self-pay | Admitting: Family Medicine

## 2019-07-13 ENCOUNTER — Telehealth: Payer: Self-pay

## 2019-07-13 DIAGNOSIS — I2510 Atherosclerotic heart disease of native coronary artery without angina pectoris: Secondary | ICD-10-CM | POA: Diagnosis not present

## 2019-07-13 DIAGNOSIS — I130 Hypertensive heart and chronic kidney disease with heart failure and stage 1 through stage 4 chronic kidney disease, or unspecified chronic kidney disease: Secondary | ICD-10-CM | POA: Diagnosis not present

## 2019-07-13 DIAGNOSIS — C9111 Chronic lymphocytic leukemia of B-cell type in remission: Secondary | ICD-10-CM | POA: Diagnosis not present

## 2019-07-13 DIAGNOSIS — I69354 Hemiplegia and hemiparesis following cerebral infarction affecting left non-dominant side: Secondary | ICD-10-CM | POA: Diagnosis not present

## 2019-07-13 DIAGNOSIS — I5032 Chronic diastolic (congestive) heart failure: Secondary | ICD-10-CM | POA: Diagnosis not present

## 2019-07-13 DIAGNOSIS — N183 Chronic kidney disease, stage 3 unspecified: Secondary | ICD-10-CM | POA: Diagnosis not present

## 2019-07-13 NOTE — Telephone Encounter (Signed)
Telephone call to patient to schedule palliative care visit with patient. Patient/family in agreement with home visit on 07-15-19 at 11:00AM.

## 2019-07-15 ENCOUNTER — Other Ambulatory Visit: Payer: Medicare Other

## 2019-07-15 ENCOUNTER — Other Ambulatory Visit: Payer: Self-pay

## 2019-07-15 DIAGNOSIS — Z515 Encounter for palliative care: Secondary | ICD-10-CM

## 2019-07-15 NOTE — Progress Notes (Signed)
COMMUNITY PALLIATIVE CARE SW NOTE  PATIENT NAME: Jimmy [name] DOB: 1928/06/01 MRN: [id number]  PRIMARY CARE PROVIDER: Ria Bush, MD  RESPONSIBLE PARTY:  Acct ID - Guarantor Home Phone Work Phone Relationship Acct Type  [id number] Jimmy [id number] [phone]  Self P/F     Jimmy [name], Jimmy [name], Jimmy [name] [id number]     PLAN OF CARE and INTERVENTIONS:             1. GOALS OF CARE/ ADVANCE CARE PLANNING:  Patient is DNR, form in home. Goal is to keep patient at home and cared for.  2. SOCIAL/EMOTIONAL/SPIRITUAL ASSESSMENT/ INTERVENTIONS:  SW and RN met with patient and Jimmy [name] (patient's wife) in the home. Patient's nephew was also present but not involved. He is visiting from out of town. Jimmy [name] provided update on patient care. Patient has had two falls, no injuries. Jimmy [name] said her neighbor helps pick-up him up and put him in bed. Patient is eating well. Patient is not sleeping well, Jimmy [name] has discussed with PCP. Main concern is care in the home and plans for care as patient declines. SW provided education on palliative care, discussed goals, discussed care options and resources, and used active and reflective listening. 3. PATIENT/CAREGIVER EDUCATION/ COPING:  Patient is alert. Patient is forgetful and confused at times. Jimmy [name] said patient is experiencing more vivid hallucinations (I.e.-seeing elephant in the home, thinking he is back in war). Patient was calm during visit, pleasant. Jimmy [name] is very supportive. Patient has friends that check-in often, but limited social support. 4. PERSONAL EMERGENCY PLAN:  Family will call 9-1-1 for emergencies. 5. COMMUNITY RESOURCES COORDINATION/ HEALTH CARE NAVIGATION:  Jimmy [name] manages care. Patient has a hired Engineer, production that comes for one hour in morning, one hour in the evening. Patient is receiving home health PT twice a week with [facility]. Patient is scheduled MRI on Friday, and follow-up with Dr. Manuella Ghazi on 4/29. [facility] [place], they advised that  patient/family call to discuss eligibility and benefits. 6. FINANCIAL/LEGAL CONCERNS/INTERVENTIONS:  Patient has a LTC policy, Jimmy [name] is going to look into this further.     SOCIAL HX:  Social History   Tobacco Use  . Smoking status: Former Smoker    Packs/day: 2.00    Years: 30.00    Pack years: 60.00    Quit date: 02/28/1979    Years since quitting: 40.4  . Smokeless tobacco: Never Used  . Tobacco comment: quit 1980  Substance Use Topics  . Alcohol use: No    Alcohol/week: 0.0 standard drinks    Comment: occasional wine    CODE STATUS: DNR ADVANCED DIRECTIVES: Y MOST FORM COMPLETE:  No. HOSPICE EDUCATION PROVIDED: None.  PPS: Patient is mostly dependent of ADLs. Patient is doing transfers only.   I spent 30 minutes with patient/family, from 11:00-11:30a providing education, support and consultation.   Jimmy Lombard, LCSW

## 2019-07-16 ENCOUNTER — Ambulatory Visit
Admission: RE | Admit: 2019-07-16 | Discharge: 2019-07-16 | Disposition: A | Discharge status: Home / Self Care | Payer: Medicare Other | Source: Ambulatory Visit | Attending: Family Medicine | Admitting: Family Medicine

## 2019-07-16 ENCOUNTER — Other Ambulatory Visit: Payer: Self-pay

## 2019-07-16 DIAGNOSIS — R441 Visual hallucinations: Secondary | ICD-10-CM | POA: Diagnosis not present

## 2019-07-16 DIAGNOSIS — I639 Cerebral infarction, unspecified: Secondary | ICD-10-CM | POA: Diagnosis not present

## 2019-07-16 DIAGNOSIS — R410 Disorientation, unspecified: Secondary | ICD-10-CM

## 2019-07-16 DIAGNOSIS — I6930 Unspecified sequelae of cerebral infarction: Secondary | ICD-10-CM | POA: Diagnosis not present

## 2019-07-16 DIAGNOSIS — G459 Transient cerebral ischemic attack, unspecified: Secondary | ICD-10-CM | POA: Diagnosis not present

## 2019-07-19 ENCOUNTER — Telehealth: Payer: Self-pay

## 2019-07-19 DIAGNOSIS — I6930 Unspecified sequelae of cerebral infarction: Secondary | ICD-10-CM

## 2019-07-19 NOTE — Telephone Encounter (Signed)
[place] Night - Client TELEPHONE ADVICE RECORD Access[facility]urse Patient Name: KENDALL DEMAREST Gender: Male DOB: 1928-06-16 Age: 86 Y 71 M 10 D Return Phone Number: Address: City/State/Zip: [place] Client [place] Night - Client Client Site Stella Physician Ria Bush - MD Contact Type Call Who Is Calling Lab Lab Name [facility] Lab Phone Number [phone] Lab Tech Name [facility] Lab Reference Number N/A Chief Complaint Lab Result (Critical or Stat) Call Type Lab Send to RN Reason for Call Report lab results Initial Comment Caller states she is calling to report a stat radiology report. Additional Comment Translation No Nurse Assessment Nurse: Rock Nephew, RN, Juliann Pulse Date/Time (Eastern Time): 07/17/2019 9:44:58 AM Is there an on-call provider listed? ---Yes Please list name of person reporting value (Lab Employee) and a contact number. ---Erline Levine from [facility] Radiology [phone] Please document the following items: Lab name Lab value (read back to lab to verify) Reference range for lab value Date and time blood was drawn Collect time of birth for bilirubin results ---MRI was done 07/16/2019 MRI shows small acute or early subacute infarct to left front subcortical white matter Guidelines Guideline Title Affirmed Question Affirmed Notes Nurse Date/Time (Eastern Time) Disp. Time Eilene Ghazi Time) Disposition Final User 07/17/2019 9:44:39 AM Paged On Call back to [facility], RN, Juliann Pulse 07/17/2019 9:47:20 AM Paged On Call back to [facility], RN, Juliann Pulse 07/17/2019 10:33:18 AM Paged On Call back to [facility], RN, Juliann Pulse 07/17/2019 11:01:21 AM Paged On Call back to [facility], RN, Juliann Pulse 07/17/2019 9:47:16 AM Clinical Call Yes [place] Nephew, RN, Juliann Pulse Comments User: Valetta Mole, RN Date/Time Eilene Ghazi Time): 07/17/2019 10:33:56 AMPLEASE NOTE: All timestamps contained within this report  are represented as [place] Standard Time. CONFIDENTIALTY NOTICE: This fax transmission is intended only for the addressee. It contains information that is legally privileged, confidential or otherwise protected from use or disclosure. If you are not the intended recipient, you are strictly prohibited from reviewing, disclosing, copying using or disseminating any of this information or taking any action in reliance on or regarding this information. If you have received this fax in error, please notify us immediately by telephone so that we can arrange for its return to [place]. Phone: [phone], Toll-Free: [phone], Fax: [phone] Page: 2 of 2 Call Id: [id number] Comments Unable to leave on call MD ( Dr. Eliezer Lofts) a message on cell phone since her mail box is full User: Valetta Mole, RN Date/Time Eilene Ghazi Time): 07/17/2019 11:02:41 AM There was no on call provider listed for 8am-1pm today but since Dr. Diona Browner was on call provider for this am and then again at 1300 I attempted to reach her but was unable. Paging DoctorName Phone DateTime Result/Outcome Message Type Notes Eliezer Lofts - MD [id number] 07/17/2019 9:44:39 AM Paged On Call Back to Call Center Doctor Paged please call Nelida Gores at [facility] [phone], thanks! Eliezer Lofts - MD [id number] 07/17/2019 10:33:18 AM Called on Call provider - No message left Doctor Paged Eliezer Lofts - MD [id number] 07/17/2019 11:01:21 AM Called on Call provider - No message left Doctor Paged Eliezer Lofts - MD 07/17/2019 11:01:33 AM Unable to Reach on call - Max Attempts

## 2019-07-19 NOTE — Telephone Encounter (Signed)
Noted. MRI showing chronic remote infarct changes (white matter changes due to chronic ischemic microangiopathy, old L basal ganglia infarct, chronic microhemorrhage L pons, R basal ganglia hemosiderin deposit) without acute hemorrhage/mass effect MRI also showing small acute or early subacute infarct in L frontal subcortical white matter (?if truly new as he had acute L frontal ischemic infarcto back in 07/2018).  Difficult situation given h/o both cerebral hemorrhage and infarcts. Continue aspirin 325mg . Has neuro f/u planned later this month. Will await further recommendations by neuro.   plz call pt/wife - MRI showing possible new strokes to L frontal region of brain. Difficult situation given h/o hemorrhage so don't want to add second blood thinner at this time but rather wait for neurology evaluation later this month. In interim, recommend ensure good BP control - can they check this daily and notify me of readings? BP Readings from Last 3 Encounters:  07/01/19 122/78  06/23/19 114/66  06/18/19 (!) 142/70

## 2019-07-20 DIAGNOSIS — I2510 Atherosclerotic heart disease of native coronary artery without angina pectoris: Secondary | ICD-10-CM | POA: Diagnosis not present

## 2019-07-20 DIAGNOSIS — N183 Chronic kidney disease, stage 3 unspecified: Secondary | ICD-10-CM | POA: Diagnosis not present

## 2019-07-20 DIAGNOSIS — I130 Hypertensive heart and chronic kidney disease with heart failure and stage 1 through stage 4 chronic kidney disease, or unspecified chronic kidney disease: Secondary | ICD-10-CM | POA: Diagnosis not present

## 2019-07-20 DIAGNOSIS — I69354 Hemiplegia and hemiparesis following cerebral infarction affecting left non-dominant side: Secondary | ICD-10-CM | POA: Diagnosis not present

## 2019-07-20 DIAGNOSIS — C9111 Chronic lymphocytic leukemia of B-cell type in remission: Secondary | ICD-10-CM | POA: Diagnosis not present

## 2019-07-20 DIAGNOSIS — I5032 Chronic diastolic (congestive) heart failure: Secondary | ICD-10-CM | POA: Diagnosis not present

## 2019-07-20 NOTE — Telephone Encounter (Signed)
Contacted pt's wife, [name], and advised. She was with the pt's therapist and the therapist advised pt's BP has been very good over the last month. Today the BP is 136/93. Advised to keep readings and f/u with clinic with the readings. Advised if over 140/90 to contact the office. [name] verbalized understanding.

## 2019-07-21 DIAGNOSIS — Z8679 Personal history of other diseases of the circulatory system: Secondary | ICD-10-CM | POA: Diagnosis not present

## 2019-07-21 DIAGNOSIS — N183 Chronic kidney disease, stage 3 unspecified: Secondary | ICD-10-CM | POA: Diagnosis not present

## 2019-07-21 DIAGNOSIS — Z79899 Other long term (current) drug therapy: Secondary | ICD-10-CM | POA: Diagnosis not present

## 2019-07-21 DIAGNOSIS — I130 Hypertensive heart and chronic kidney disease with heart failure and stage 1 through stage 4 chronic kidney disease, or unspecified chronic kidney disease: Secondary | ICD-10-CM | POA: Diagnosis not present

## 2019-07-21 DIAGNOSIS — Z7982 Long term (current) use of aspirin: Secondary | ICD-10-CM | POA: Diagnosis not present

## 2019-07-21 DIAGNOSIS — Z951 Presence of aortocoronary bypass graft: Secondary | ICD-10-CM | POA: Diagnosis not present

## 2019-07-21 DIAGNOSIS — D696 Thrombocytopenia, unspecified: Secondary | ICD-10-CM | POA: Diagnosis not present

## 2019-07-21 DIAGNOSIS — I69354 Hemiplegia and hemiparesis following cerebral infarction affecting left non-dominant side: Secondary | ICD-10-CM | POA: Diagnosis not present

## 2019-07-21 DIAGNOSIS — C44311 Basal cell carcinoma of skin of nose: Secondary | ICD-10-CM | POA: Diagnosis not present

## 2019-07-21 DIAGNOSIS — C9111 Chronic lymphocytic leukemia of B-cell type in remission: Secondary | ICD-10-CM | POA: Diagnosis not present

## 2019-07-21 DIAGNOSIS — I5032 Chronic diastolic (congestive) heart failure: Secondary | ICD-10-CM | POA: Diagnosis not present

## 2019-07-21 DIAGNOSIS — I2510 Atherosclerotic heart disease of native coronary artery without angina pectoris: Secondary | ICD-10-CM | POA: Diagnosis not present

## 2019-07-21 DIAGNOSIS — I69391 Dysphagia following cerebral infarction: Secondary | ICD-10-CM | POA: Diagnosis not present

## 2019-07-26 DIAGNOSIS — N183 Chronic kidney disease, stage 3 unspecified: Secondary | ICD-10-CM | POA: Diagnosis not present

## 2019-07-26 DIAGNOSIS — I5032 Chronic diastolic (congestive) heart failure: Secondary | ICD-10-CM | POA: Diagnosis not present

## 2019-07-26 DIAGNOSIS — I130 Hypertensive heart and chronic kidney disease with heart failure and stage 1 through stage 4 chronic kidney disease, or unspecified chronic kidney disease: Secondary | ICD-10-CM | POA: Diagnosis not present

## 2019-07-26 DIAGNOSIS — I69354 Hemiplegia and hemiparesis following cerebral infarction affecting left non-dominant side: Secondary | ICD-10-CM | POA: Diagnosis not present

## 2019-07-26 DIAGNOSIS — C9111 Chronic lymphocytic leukemia of B-cell type in remission: Secondary | ICD-10-CM | POA: Diagnosis not present

## 2019-07-26 DIAGNOSIS — I2510 Atherosclerotic heart disease of native coronary artery without angina pectoris: Secondary | ICD-10-CM | POA: Diagnosis not present

## 2019-07-28 ENCOUNTER — Telehealth: Payer: Self-pay

## 2019-07-28 DIAGNOSIS — N183 Chronic kidney disease, stage 3 unspecified: Secondary | ICD-10-CM | POA: Diagnosis not present

## 2019-07-28 DIAGNOSIS — I130 Hypertensive heart and chronic kidney disease with heart failure and stage 1 through stage 4 chronic kidney disease, or unspecified chronic kidney disease: Secondary | ICD-10-CM | POA: Diagnosis not present

## 2019-07-28 DIAGNOSIS — I2510 Atherosclerotic heart disease of native coronary artery without angina pectoris: Secondary | ICD-10-CM | POA: Diagnosis not present

## 2019-07-28 DIAGNOSIS — C9111 Chronic lymphocytic leukemia of B-cell type in remission: Secondary | ICD-10-CM | POA: Diagnosis not present

## 2019-07-28 DIAGNOSIS — I69354 Hemiplegia and hemiparesis following cerebral infarction affecting left non-dominant side: Secondary | ICD-10-CM | POA: Diagnosis not present

## 2019-07-28 DIAGNOSIS — I5032 Chronic diastolic (congestive) heart failure: Secondary | ICD-10-CM | POA: Diagnosis not present

## 2019-07-28 NOTE — Telephone Encounter (Signed)
Carl PTA with [facility] left message that pt has fallen couple of times recently with no apparent injury.FYI to Dr Darnell Level.

## 2019-07-29 DIAGNOSIS — I63412 Cerebral infarction due to embolism of left middle cerebral artery: Secondary | ICD-10-CM | POA: Diagnosis not present

## 2019-07-29 DIAGNOSIS — R410 Disorientation, unspecified: Secondary | ICD-10-CM | POA: Diagnosis not present

## 2019-07-29 DIAGNOSIS — I610 Nontraumatic intracerebral hemorrhage in hemisphere, subcortical: Secondary | ICD-10-CM | POA: Diagnosis not present

## 2019-07-29 DIAGNOSIS — R2681 Unsteadiness on feet: Secondary | ICD-10-CM | POA: Diagnosis not present

## 2019-07-29 DIAGNOSIS — I6522 Occlusion and stenosis of left carotid artery: Secondary | ICD-10-CM | POA: Diagnosis not present

## 2019-07-29 DIAGNOSIS — I6521 Occlusion and stenosis of right carotid artery: Secondary | ICD-10-CM | POA: Diagnosis not present

## 2019-08-02 DIAGNOSIS — I5032 Chronic diastolic (congestive) heart failure: Secondary | ICD-10-CM | POA: Diagnosis not present

## 2019-08-02 DIAGNOSIS — N183 Chronic kidney disease, stage 3 unspecified: Secondary | ICD-10-CM | POA: Diagnosis not present

## 2019-08-02 DIAGNOSIS — I69354 Hemiplegia and hemiparesis following cerebral infarction affecting left non-dominant side: Secondary | ICD-10-CM | POA: Diagnosis not present

## 2019-08-02 DIAGNOSIS — I2510 Atherosclerotic heart disease of native coronary artery without angina pectoris: Secondary | ICD-10-CM | POA: Diagnosis not present

## 2019-08-02 DIAGNOSIS — I130 Hypertensive heart and chronic kidney disease with heart failure and stage 1 through stage 4 chronic kidney disease, or unspecified chronic kidney disease: Secondary | ICD-10-CM | POA: Diagnosis not present

## 2019-08-02 DIAGNOSIS — C9111 Chronic lymphocytic leukemia of B-cell type in remission: Secondary | ICD-10-CM | POA: Diagnosis not present

## 2019-08-04 DIAGNOSIS — N183 Chronic kidney disease, stage 3 unspecified: Secondary | ICD-10-CM | POA: Diagnosis not present

## 2019-08-04 DIAGNOSIS — I5032 Chronic diastolic (congestive) heart failure: Secondary | ICD-10-CM | POA: Diagnosis not present

## 2019-08-04 DIAGNOSIS — I69354 Hemiplegia and hemiparesis following cerebral infarction affecting left non-dominant side: Secondary | ICD-10-CM | POA: Diagnosis not present

## 2019-08-04 DIAGNOSIS — I2510 Atherosclerotic heart disease of native coronary artery without angina pectoris: Secondary | ICD-10-CM | POA: Diagnosis not present

## 2019-08-04 DIAGNOSIS — C9111 Chronic lymphocytic leukemia of B-cell type in remission: Secondary | ICD-10-CM | POA: Diagnosis not present

## 2019-08-04 DIAGNOSIS — I130 Hypertensive heart and chronic kidney disease with heart failure and stage 1 through stage 4 chronic kidney disease, or unspecified chronic kidney disease: Secondary | ICD-10-CM | POA: Diagnosis not present

## 2019-08-09 DIAGNOSIS — I5032 Chronic diastolic (congestive) heart failure: Secondary | ICD-10-CM | POA: Diagnosis not present

## 2019-08-09 DIAGNOSIS — I130 Hypertensive heart and chronic kidney disease with heart failure and stage 1 through stage 4 chronic kidney disease, or unspecified chronic kidney disease: Secondary | ICD-10-CM | POA: Diagnosis not present

## 2019-08-09 DIAGNOSIS — I2510 Atherosclerotic heart disease of native coronary artery without angina pectoris: Secondary | ICD-10-CM | POA: Diagnosis not present

## 2019-08-09 DIAGNOSIS — N183 Chronic kidney disease, stage 3 unspecified: Secondary | ICD-10-CM | POA: Diagnosis not present

## 2019-08-09 DIAGNOSIS — C9111 Chronic lymphocytic leukemia of B-cell type in remission: Secondary | ICD-10-CM | POA: Diagnosis not present

## 2019-08-09 DIAGNOSIS — I69354 Hemiplegia and hemiparesis following cerebral infarction affecting left non-dominant side: Secondary | ICD-10-CM | POA: Diagnosis not present

## 2019-08-10 ENCOUNTER — Other Ambulatory Visit: Payer: Self-pay

## 2019-08-10 ENCOUNTER — Ambulatory Visit (INDEPENDENT_AMBULATORY_CARE_PROVIDER_SITE_OTHER): Payer: Medicare Other

## 2019-08-10 DIAGNOSIS — I6521 Occlusion and stenosis of right carotid artery: Secondary | ICD-10-CM

## 2019-08-10 DIAGNOSIS — G459 Transient cerebral ischemic attack, unspecified: Secondary | ICD-10-CM | POA: Diagnosis not present

## 2019-08-11 DIAGNOSIS — H903 Sensorineural hearing loss, bilateral: Secondary | ICD-10-CM | POA: Diagnosis not present

## 2019-08-11 DIAGNOSIS — H6123 Impacted cerumen, bilateral: Secondary | ICD-10-CM | POA: Diagnosis not present

## 2019-08-13 DIAGNOSIS — I5032 Chronic diastolic (congestive) heart failure: Secondary | ICD-10-CM | POA: Diagnosis not present

## 2019-08-13 DIAGNOSIS — I69354 Hemiplegia and hemiparesis following cerebral infarction affecting left non-dominant side: Secondary | ICD-10-CM | POA: Diagnosis not present

## 2019-08-13 DIAGNOSIS — N183 Chronic kidney disease, stage 3 unspecified: Secondary | ICD-10-CM | POA: Diagnosis not present

## 2019-08-13 DIAGNOSIS — C9111 Chronic lymphocytic leukemia of B-cell type in remission: Secondary | ICD-10-CM | POA: Diagnosis not present

## 2019-08-13 DIAGNOSIS — I130 Hypertensive heart and chronic kidney disease with heart failure and stage 1 through stage 4 chronic kidney disease, or unspecified chronic kidney disease: Secondary | ICD-10-CM | POA: Diagnosis not present

## 2019-08-13 DIAGNOSIS — I2510 Atherosclerotic heart disease of native coronary artery without angina pectoris: Secondary | ICD-10-CM | POA: Diagnosis not present

## 2019-08-17 ENCOUNTER — Telehealth: Payer: Self-pay

## 2019-08-17 DIAGNOSIS — N183 Chronic kidney disease, stage 3 unspecified: Secondary | ICD-10-CM | POA: Diagnosis not present

## 2019-08-17 DIAGNOSIS — C9111 Chronic lymphocytic leukemia of B-cell type in remission: Secondary | ICD-10-CM | POA: Diagnosis not present

## 2019-08-17 DIAGNOSIS — I130 Hypertensive heart and chronic kidney disease with heart failure and stage 1 through stage 4 chronic kidney disease, or unspecified chronic kidney disease: Secondary | ICD-10-CM | POA: Diagnosis not present

## 2019-08-17 DIAGNOSIS — I2510 Atherosclerotic heart disease of native coronary artery without angina pectoris: Secondary | ICD-10-CM | POA: Diagnosis not present

## 2019-08-17 DIAGNOSIS — I5032 Chronic diastolic (congestive) heart failure: Secondary | ICD-10-CM | POA: Diagnosis not present

## 2019-08-17 DIAGNOSIS — I69354 Hemiplegia and hemiparesis following cerebral infarction affecting left non-dominant side: Secondary | ICD-10-CM | POA: Diagnosis not present

## 2019-08-17 NOTE — Telephone Encounter (Signed)
Brad PT asst with [facility] left v/m that pt fell earlier today; pt was being assisted from bathroom and lost control of each other and pt was slowly lowered to floor. Brad PT asst assisted pt up off floor with no injury. FYI to Dr Darnell Level.

## 2019-08-18 DIAGNOSIS — C9111 Chronic lymphocytic leukemia of B-cell type in remission: Secondary | ICD-10-CM | POA: Diagnosis not present

## 2019-08-18 DIAGNOSIS — I5032 Chronic diastolic (congestive) heart failure: Secondary | ICD-10-CM | POA: Diagnosis not present

## 2019-08-18 DIAGNOSIS — I69354 Hemiplegia and hemiparesis following cerebral infarction affecting left non-dominant side: Secondary | ICD-10-CM | POA: Diagnosis not present

## 2019-08-18 DIAGNOSIS — I2510 Atherosclerotic heart disease of native coronary artery without angina pectoris: Secondary | ICD-10-CM | POA: Diagnosis not present

## 2019-08-18 DIAGNOSIS — I130 Hypertensive heart and chronic kidney disease with heart failure and stage 1 through stage 4 chronic kidney disease, or unspecified chronic kidney disease: Secondary | ICD-10-CM | POA: Diagnosis not present

## 2019-08-18 DIAGNOSIS — N183 Chronic kidney disease, stage 3 unspecified: Secondary | ICD-10-CM | POA: Diagnosis not present

## 2019-08-19 ENCOUNTER — Telehealth: Payer: Self-pay

## 2019-08-19 NOTE — Telephone Encounter (Signed)
[name] returned call and in agreement with RN making home visit 08/25/19 at 1:00 PM.

## 2019-08-19 NOTE — Telephone Encounter (Signed)
Telephone call to schedule palliative care visit.  RN left message requesting call back to schedule palliative care visit. 

## 2019-08-20 DIAGNOSIS — I130 Hypertensive heart and chronic kidney disease with heart failure and stage 1 through stage 4 chronic kidney disease, or unspecified chronic kidney disease: Secondary | ICD-10-CM | POA: Diagnosis not present

## 2019-08-20 DIAGNOSIS — I5032 Chronic diastolic (congestive) heart failure: Secondary | ICD-10-CM | POA: Diagnosis not present

## 2019-08-20 DIAGNOSIS — I69354 Hemiplegia and hemiparesis following cerebral infarction affecting left non-dominant side: Secondary | ICD-10-CM | POA: Diagnosis not present

## 2019-08-20 DIAGNOSIS — N183 Chronic kidney disease, stage 3 unspecified: Secondary | ICD-10-CM | POA: Diagnosis not present

## 2019-08-20 DIAGNOSIS — I69391 Dysphagia following cerebral infarction: Secondary | ICD-10-CM | POA: Diagnosis not present

## 2019-08-20 DIAGNOSIS — Z79899 Other long term (current) drug therapy: Secondary | ICD-10-CM | POA: Diagnosis not present

## 2019-08-20 DIAGNOSIS — Z8679 Personal history of other diseases of the circulatory system: Secondary | ICD-10-CM | POA: Diagnosis not present

## 2019-08-20 DIAGNOSIS — C9111 Chronic lymphocytic leukemia of B-cell type in remission: Secondary | ICD-10-CM | POA: Diagnosis not present

## 2019-08-20 DIAGNOSIS — D696 Thrombocytopenia, unspecified: Secondary | ICD-10-CM | POA: Diagnosis not present

## 2019-08-20 DIAGNOSIS — Z951 Presence of aortocoronary bypass graft: Secondary | ICD-10-CM | POA: Diagnosis not present

## 2019-08-20 DIAGNOSIS — I2510 Atherosclerotic heart disease of native coronary artery without angina pectoris: Secondary | ICD-10-CM | POA: Diagnosis not present

## 2019-08-20 DIAGNOSIS — Z7982 Long term (current) use of aspirin: Secondary | ICD-10-CM | POA: Diagnosis not present

## 2019-08-23 ENCOUNTER — Encounter: Payer: Self-pay | Admitting: Emergency Medicine

## 2019-08-23 ENCOUNTER — Emergency Department: Payer: Medicare Other

## 2019-08-23 ENCOUNTER — Emergency Department
Admission: EM | Admit: 2019-08-23 | Discharge: 2019-08-23 | Disposition: A | Discharge status: Home / Self Care | Payer: Medicare Other | Attending: Emergency Medicine | Admitting: Emergency Medicine

## 2019-08-23 ENCOUNTER — Other Ambulatory Visit: Payer: Self-pay

## 2019-08-23 DIAGNOSIS — R4182 Altered mental status, unspecified: Secondary | ICD-10-CM | POA: Diagnosis not present

## 2019-08-23 DIAGNOSIS — Z5321 Procedure and treatment not carried out due to patient leaving prior to being seen by health care provider: Secondary | ICD-10-CM | POA: Insufficient documentation

## 2019-08-23 DIAGNOSIS — R55 Syncope and collapse: Secondary | ICD-10-CM | POA: Diagnosis not present

## 2019-08-23 DIAGNOSIS — R4020 Unspecified coma: Secondary | ICD-10-CM | POA: Diagnosis not present

## 2019-08-23 LAB — COMPREHENSIVE METABOLIC PANEL
ALT: 16 U/L (ref 0–44)
AST: 16 U/L (ref 15–41)
Albumin: 4.1 g/dL (ref 3.5–5.0)
Alkaline Phosphatase: 131 U/L — ABNORMAL HIGH (ref 38–126)
Anion gap: 9 (ref 5–15)
BUN: 19 mg/dL (ref 8–23)
CO2: 29 mmol/L (ref 22–32)
Calcium: 9.8 mg/dL (ref 8.9–10.3)
Chloride: 101 mmol/L (ref 98–111)
Creatinine, Ser: 1.32 mg/dL — ABNORMAL HIGH (ref 0.61–1.24)
GFR calc Af Amer: 55 mL/min — ABNORMAL LOW (ref >60)
GFR calc non Af Amer: 48 mL/min — ABNORMAL LOW (ref >60)
Glucose, Bld: 101 mg/dL — ABNORMAL HIGH (ref 70–99)
Potassium: 3.8 mmol/L (ref 3.5–5.1)
Sodium: 139 mmol/L (ref 135–145)
Total Bilirubin: 0.9 mg/dL (ref 0.3–1.2)
Total Protein: 7.1 g/dL (ref 6.5–8.1)

## 2019-08-23 LAB — CBC
HCT: 41.4 % (ref 39.0–52.0)
Hemoglobin: 14.2 g/dL (ref 13.0–17.0)
MCH: 31.8 pg (ref 26.0–34.0)
MCHC: 34.3 g/dL (ref 30.0–36.0)
MCV: 92.8 fL (ref 80.0–100.0)
Platelets: 176 10*3/uL (ref 150–400)
RBC: 4.46 MIL/uL (ref 4.22–5.81)
RDW: 12.9 % (ref 11.5–15.5)
WBC: 7.3 10*3/uL (ref 4.0–10.5)
nRBC: 0 % (ref 0.0–0.2)

## 2019-08-23 LAB — DIFFERENTIAL
Abs Immature Granulocytes: 0.03 10*3/uL (ref 0.00–0.07)
Basophils Absolute: 0 10*3/uL (ref 0.0–0.1)
Basophils Relative: 1 %
Eosinophils Absolute: 0.3 10*3/uL (ref 0.0–0.5)
Eosinophils Relative: 4 %
Immature Granulocytes: 0 %
Lymphocytes Relative: 15 %
Lymphs Abs: 1.1 10*3/uL (ref 0.7–4.0)
Monocytes Absolute: 0.7 10*3/uL (ref 0.1–1.0)
Monocytes Relative: 9 %
Neutro Abs: 5.2 10*3/uL (ref 1.7–7.7)
Neutrophils Relative %: 71 %

## 2019-08-23 LAB — PROTIME-INR
INR: 1 (ref 0.8–1.2)
Prothrombin Time: 13 seconds (ref 11.4–15.2)

## 2019-08-23 LAB — APTT: aPTT: 32 seconds (ref 24–36)

## 2019-08-23 NOTE — ED Triage Notes (Signed)
Patient presents to the ED via EMS from home.  Per patient's wife, patient had an episode of unresponsiveness that lasted at least 20 min.  Patient's wife states patient was using his exercise bike and she left him for a few minutes and when she came back patient was slumped over on the bike and unresponsive.  Patient had a similar episode in March that was diagnosed as a TIA.  Patient has history of multiple strokes.  Patient is alert and oriented to self, time and place.  He knew the year but not the month.  Per EMS staff, they are familiar with patient and state patient seems to be at his baseline.  Patient has history of left sided deficits.

## 2019-08-24 ENCOUNTER — Telehealth: Payer: Self-pay | Admitting: Emergency Medicine

## 2019-08-24 DIAGNOSIS — I69354 Hemiplegia and hemiparesis following cerebral infarction affecting left non-dominant side: Secondary | ICD-10-CM | POA: Diagnosis not present

## 2019-08-24 DIAGNOSIS — I2510 Atherosclerotic heart disease of native coronary artery without angina pectoris: Secondary | ICD-10-CM | POA: Diagnosis not present

## 2019-08-24 DIAGNOSIS — I130 Hypertensive heart and chronic kidney disease with heart failure and stage 1 through stage 4 chronic kidney disease, or unspecified chronic kidney disease: Secondary | ICD-10-CM | POA: Diagnosis not present

## 2019-08-24 DIAGNOSIS — I5032 Chronic diastolic (congestive) heart failure: Secondary | ICD-10-CM | POA: Diagnosis not present

## 2019-08-24 DIAGNOSIS — C9111 Chronic lymphocytic leukemia of B-cell type in remission: Secondary | ICD-10-CM | POA: Diagnosis not present

## 2019-08-24 DIAGNOSIS — N183 Chronic kidney disease, stage 3 unspecified: Secondary | ICD-10-CM | POA: Diagnosis not present

## 2019-08-24 NOTE — Telephone Encounter (Signed)
Called patient due to lwot to inquire about condition and follow up plans. Spoke with fam member.  She says they are at the [facility] and have appt with pcp tomorrow.  I told her to bring him back if he has another episode prior to tomorrow and I will send message to pcp to inform of the CT scan/labs available.

## 2019-08-25 ENCOUNTER — Telehealth: Payer: Self-pay

## 2019-08-25 ENCOUNTER — Encounter: Payer: Self-pay | Admitting: Family Medicine

## 2019-08-25 ENCOUNTER — Other Ambulatory Visit: Payer: Medicare Other

## 2019-08-25 ENCOUNTER — Ambulatory Visit (INDEPENDENT_AMBULATORY_CARE_PROVIDER_SITE_OTHER): Payer: Medicare Other | Admitting: Family Medicine

## 2019-08-25 ENCOUNTER — Other Ambulatory Visit: Payer: Self-pay

## 2019-08-25 VITALS — BP 134/66 | HR 75 | Temp 98.0°F | Ht 69.5 in | Wt 147.0 lb

## 2019-08-25 DIAGNOSIS — I6930 Unspecified sequelae of cerebral infarction: Secondary | ICD-10-CM | POA: Diagnosis not present

## 2019-08-25 DIAGNOSIS — R531 Weakness: Secondary | ICD-10-CM

## 2019-08-25 DIAGNOSIS — Z515 Encounter for palliative care: Secondary | ICD-10-CM | POA: Diagnosis not present

## 2019-08-25 DIAGNOSIS — G309 Alzheimer's disease, unspecified: Secondary | ICD-10-CM | POA: Diagnosis not present

## 2019-08-25 DIAGNOSIS — I10 Essential (primary) hypertension: Secondary | ICD-10-CM

## 2019-08-25 DIAGNOSIS — N1831 Chronic kidney disease, stage 3a: Secondary | ICD-10-CM | POA: Diagnosis not present

## 2019-08-25 DIAGNOSIS — I6521 Occlusion and stenosis of right carotid artery: Secondary | ICD-10-CM

## 2019-08-25 DIAGNOSIS — F0280 Dementia in other diseases classified elsewhere without behavioral disturbance: Secondary | ICD-10-CM | POA: Diagnosis not present

## 2019-08-25 DIAGNOSIS — R4189 Other symptoms and signs involving cognitive functions and awareness: Secondary | ICD-10-CM | POA: Insufficient documentation

## 2019-08-25 DIAGNOSIS — F0150 Vascular dementia without behavioral disturbance: Secondary | ICD-10-CM | POA: Diagnosis not present

## 2019-08-25 DIAGNOSIS — R404 Transient alteration of awareness: Secondary | ICD-10-CM | POA: Insufficient documentation

## 2019-08-25 DIAGNOSIS — I69359 Hemiplegia and hemiparesis following cerebral infarction affecting unspecified side: Secondary | ICD-10-CM

## 2019-08-25 NOTE — Assessment & Plan Note (Signed)
Latest [place] improvement noted. Cards f/u scheduled for 09/2019. Continue aspirin, statin.

## 2019-08-25 NOTE — Progress Notes (Signed)
PATIENT NAME: Jimmy [name] DOB: 1928-05-28 MRN: [id number]  PRIMARY CARE PROVIDER: Ria Bush, MD  RESPONSIBLE PARTY:  Acct ID - Guarantor Home Phone Work Phone Relationship Acct Type  [id number] - Carmelina [id number] [phone]  Self P/F     [address], [place], [place] [address]    PLAN OF CARE and INTERVENTIONS:               1.  GOALS OF CARE/ ADVANCE CARE PLANNING:  Remain in home with wife, get stronger and be able to move better.                 2.  PATIENT/CAREGIVER EDUCATION:  Education on s/s of TIA's, education on fall precautions, education on s/s of infection, support               3.  DISEASE STATUS:  RN made scheduled palliative care home visit. Nurse met with patient and patients wife [name]. Patient sitting in recliner chair alert and oriented. Wife reports patient had ER visit on  on Monday. Wife reports patient was on patio exercising.  Wife states she went out to check on patient and patients eyes roll back in his head and patient was out. Wife states she called neighbor and they made  decision patient should go to ER.  Wife reports they were at ER from 3:30 PM until 10 PM and still had not seen MD.  Wife states she made  decision to bring patient home and patient saw primary care provider today. Patients CT scan showed patient did not suffer a CVA.  Dr. Danise Mina feels patient likely suffered another TIA.  Education to wife patient will likely suffer other TIA's.  Wife reports patient has carotid study last week.  Wife reports Dr Danise Mina wants to know if patient suffer another likely TIA episode.  Patient denies having pain at the present time. Patient states he is hungry and requesting wife go pick up lunch.   Wife reports patient has not suffered any recent falls.  Wife wants to talk with social worker about aid and attendance service for patient through the [place]. Nurse sent email to [facility] to follow up with wife regarding aid and attendance service.  Patients current weight is 150 lbs. Patient reports his appetite is good. Patient denies having any nausea or vomiting. Patient denies having any blurred vision or increased weakness from TIA. Patient requires moderate assist with ADL's. Patient denies shortness of breath. Wife reports patient continues to have a cough that is worse after eating and at night. Patient has no edema in his lower extremities. Patients vital signs are stable. Patient has had no changes in current medications. Patient wife remain in agreement with palliative care services. Nurse schedule follow-up nursing visit with social worker June 11th 2021 at 2:30. RN called and left detailed message for Dr Anselmo Pickler office on details of visit today per wifes request.  Patient and wife encouraged to contact palliative care with questions or concerns.    HISTORY OF PRESENT ILLNESS: Patient is a 86 year old patient who resides in home with his wife [name].  Patient is followed by palliative care.  Patient is seen monthly and PRN.   CODE STATUS: DNR   ADVANCED DIRECTIVES: Y MOST FORM: No PPS: 40%   PHYSICAL EXAM:   VITALS: Today's Vitals   08/25/19 1349  BP: 100/70  Pulse: 63  Resp: 16  Temp: 98.4 F (36.9 C)  TempSrc: Temporal  SpO2: 98%  Weight: 150 lb (68 kg)  PainSc: 0-No pain    LUNGS: clear to auscultation  CARDIAC: Cor RRR  EXTREMITIES: no edema SKIN: Skin color, texture, turgor normal. No rashes or lesions  NEURO: positive for gait problems and weakness        , RN 

## 2019-08-25 NOTE — Assessment & Plan Note (Signed)
Reviewed latest labs from ER with pt and wife. Encouraged good hydration status.

## 2019-08-25 NOTE — Progress Notes (Signed)
This visit was conducted in person.  BP 134/66 (BP Location: Left Arm, Patient Position: Sitting, Cuff Size: Normal)   Pulse 75   Temp 98 F (36.7 C) (Temporal)   Ht 5' 9.5" (1.765 m)   Wt 147 lb (66.7 kg)   SpO2 97%   BMI 21.40 kg/m    CC: ER f/u visit  Subjective:    [name], male    DOB: 1928/12/16, 86 y.o.   MRN: [id number]  HPI: Jimmy [name] is a 86 y.o. male presenting on 08/25/2019 for Hospitalization Follow-up (Pt accompanied by wife, [name]- temp 97.6.)   Recent ER visit after unresponsive episode that lasted 20 min, while using exercise bike, left without being evaluated due to prolonged wait. Records reviewed including labs (CBC, CMP, PT/INR) and imaging. No new obvious stroke on CT (stable age related atrophy, ventriculomegaly and periventricular white matter disease with remote lacunar basal ganglia infarcts). H/o recurrent TIAs and strokes (hemorrhagic and ischemic) in the past with residual L sided deficit (L MCA territory anterior division ischemic infarct involving L frontal subcortical white matter - latest stroke). Continues aspirin and statin. Most recently aspirin increased to 325mg  daily. H/o plavix intolerance. History of chronic R basal ganglia hemorrhage due to high blood pressure. Aricept 5mg  was restarted for probable mixed dementia (Alz + vascular).   Unresponsive episode happened while on exercise bicycle. Denies premonitory symptoms. This was 4th day in a row he had used exercise bicycle - on gazebo. They are planning to move exercise bike indoors. Denies orthostasis.   He and wife feel otherwise he's been doing well. Now able to walk with walker unassisted indoors and get in and out of car with more ease. They feel HH PT is helping.   Sees neurology Manuella Ghazi) last seen 07/2019.  MRI HEAD WITHOUT CONTRAST 07/17/2019: Small acute or early subacute infarct in the left frontal subcortical white matter. No acute hemorrhage or mass effect.  Generalized advanced atrophy with severe sequelae of chronic ischemic microangiopathy.   Multiple recurrent falls over the last few months.  HH PT involved.  Palliative care involved - seeing if qualifies for aid and attendant service given h/o military service. F/u visit scheduled today.      Relevant past medical, surgical, family and social history reviewed and updated as indicated. Interim medical history since our last visit reviewed. Allergies and medications reviewed and updated. Outpatient Medications Prior to Visit  Medication Sig Dispense Refill  . acetaminophen (TYLENOL) 325 MG tablet Take 2 tablets (650 mg total) by mouth every 4 (four) hours as needed for mild pain (or temp > 37.5 C (99.5 F)).    [name] allopurinol (ZYLOPRIM) 300 MG tablet TAKE 1 TABLET DAILY (Patient taking differently: Take 300 mg by mouth daily. ) 90 tablet 3  . aspirin EC 325 MG EC tablet Take 1 tablet (325 mg total) by mouth daily. 180 tablet 0  . cholecalciferol (VITAMIN D) 1000 units tablet Take 2,000 Units by mouth daily.    . diphenhydrAMINE (BENADRYL ALLERGY) 25 MG tablet Take 1 tablet (25 mg total) by mouth at bedtime as needed for sleep.    [name] donepezil (ARICEPT) 5 MG tablet Take 5 mg by mouth daily.    [name] lisinopril (ZESTRIL) 5 MG tablet TAKE 1 TABLET DAILY (NEW DOSE) (Patient taking differently: Take 5 mg by mouth daily. ) 90 tablet 3  . nitroGLYCERIN (NITROSTAT) 0.4 MG SL tablet Place 1 tablet (0.4 mg total) under the tongue every  5 (five) minutes as needed for chest pain (max 3 doses in 15 minutes). 25 tablet 3  . polyethylene glycol powder (GLYCOLAX/MIRALAX) powder Take 17 g by mouth every other day.     . simvastatin (ZOCOR) 20 MG tablet Take 1 tablet (20 mg total) by mouth at bedtime. 90 tablet 1  . valACYclovir (VALTREX) 500 MG tablet TAKE 1 TABLET DAILY (Patient taking differently: Take 500 mg by mouth daily. ) 90 tablet 3  . vitamin B-12 (CYANOCOBALAMIN) 1000 MCG tablet Take 1 tablet (1,000 mcg  total) by mouth daily.     No facility-administered medications prior to visit.     Per HPI unless specifically indicated in ROS section below Review of Systems Objective:  BP 134/66 (BP Location: Left Arm, Patient Position: Sitting, Cuff Size: Normal)   Pulse 75   Temp 98 F (36.7 C) (Temporal)   Ht 5' 9.5" (1.765 m)   Wt 147 lb (66.7 kg)   SpO2 97%   BMI 21.40 kg/m   Wt Readings from Last 3 Encounters:  08/25/19 147 lb (66.7 kg)  08/23/19 139 lb (63 kg)  07/01/19 150 lb 9 oz (68.3 kg)      Physical Exam Vitals and nursing note reviewed.  Constitutional:      Appearance: Normal appearance. He is not ill-appearing.     Comments: In transport chair today  Eyes:     Extraocular Movements: Extraocular movements intact.     Pupils: Pupils are equal, round, and reactive to light.  Cardiovascular:     Rate and Rhythm: Normal rate and regular rhythm.     Pulses: Normal pulses.     Heart sounds: Normal heart sounds. No murmur.  Pulmonary:     Effort: Pulmonary effort is normal. No respiratory distress.     Breath sounds: Normal breath sounds. No wheezing, rhonchi or rales.  Musculoskeletal:     Right lower leg: No edema.     Left lower leg: No edema.  Skin:    General: Skin is warm and dry.  Neurological:     Mental Status: He is alert.     Motor: Weakness present.     Comments:  Right handed Diminished LLE strength, grip strength 5/5 strength BLE  Psychiatric:        Mood and Affect: Mood normal.        Behavior: Behavior normal.       Results for orders placed or performed during the hospital encounter of 08/23/19  CBC  Result Value Ref Range   WBC 7.3 4.0 - 10.5 K/uL   RBC 4.46 4.22 - 5.81 MIL/uL   Hemoglobin 14.2 13.0 - 17.0 g/dL   HCT 41.4 39.0 - 52.0 %   MCV 92.8 80.0 - 100.0 fL   MCH 31.8 26.0 - 34.0 pg   MCHC 34.3 30.0 - 36.0 g/dL   RDW 12.9 11.5 - 15.5 %   Platelets 176 150 - 400 K/uL   nRBC 0.0 0.0 - 0.2 %  Protime-INR  Result Value Ref Range    Prothrombin Time 13.0 11.4 - 15.2 seconds   INR 1.0 0.8 - 1.2  APTT  Result Value Ref Range   aPTT 32 24 - 36 seconds  Differential  Result Value Ref Range   Neutrophils Relative % 71 %   Neutro Abs 5.2 1.7 - 7.7 K/uL   Lymphocytes Relative 15 %   Lymphs Abs 1.1 0.7 - 4.0 K/uL   Monocytes Relative 9 %   Monocytes Absolute  0.7 0.1 - 1.0 K/uL   Eosinophils Relative 4 %   Eosinophils Absolute 0.3 0.0 - 0.5 K/uL   Basophils Relative 1 %   Basophils Absolute 0.0 0.0 - 0.1 K/uL   Immature Granulocytes 0 %   Abs Immature Granulocytes 0.03 0.00 - 0.07 K/uL  Comprehensive metabolic panel  Result Value Ref Range   Sodium 139 135 - 145 mmol/L   Potassium 3.8 3.5 - 5.1 mmol/L   Chloride 101 98 - 111 mmol/L   CO2 29 22 - 32 mmol/L   Glucose, Bld 101 (H) 70 - 99 mg/dL   BUN 19 8 - 23 mg/dL   Creatinine, Ser 1.32 (H) 0.61 - 1.24 mg/dL   Calcium 9.8 8.9 - 10.3 mg/dL   Total Protein 7.1 6.5 - 8.1 g/dL   Albumin 4.1 3.5 - 5.0 g/dL   AST 16 15 - 41 U/L   ALT 16 0 - 44 U/L   Alkaline Phosphatase 131 (H) 38 - 126 U/L   Total Bilirubin 0.9 0.3 - 1.2 mg/dL   GFR calc non Af Amer 48 (L) >60 mL/min   GFR calc Af Amer 55 (L) >60 mL/min   Anion gap 9 5 - 15   Assessment & Plan:  This visit occurred during the SARS-CoV-2 public health emergency.  Safety protocols were in place, including screening questions prior to the visit, additional usage of staff PPE, and extensive cleaning of exam room while observing appropriate contact time as indicated for disinfecting solutions.   Problem List Items Addressed This Visit    Unresponsive episode - Primary    Recent episode of unclear etiology, possibly related to working out in the heat (bike was outdoors) - they are planning to bring exercise bicycle indoors with summer months coming up. Workup overall reassuring by ER yesterday. Update if recurrent episode despite bringing exercise indoors.       Palliative care patient    Awaiting evaluation for  further assistance through [place].       Mixed Alzheimer's and vascular dementia ([employer])    aricept recently started by neurology for mixed dementia. They decline higher dose at this time. Seem to be tolerating well.       History of stroke with residual deficit    Residual LUE hemiparesis. Recently saw neurology, continues full dose aspirin with statin.       History of hemorrhagic stroke with residual hemiparesis (HCC)   General weakness    Improvement noted with HH PT - continue this. No restrictions for now, but I did ask PT to check vitals before and after therapy sessions - and if normal, continue without restrictions.       Essential hypertension    Chronic, stable on current regimen.       Chronic kidney disease, stage 3    Reviewed latest labs from ER with pt and wife. Encouraged good hydration status.       Carotid artery stenosis    Latest [place] improvement noted. Cards f/u scheduled for 09/2019. Continue aspirin, statin.           No orders of the defined types were placed in this encounter.  No orders of the defined types were placed in this encounter.   Patient Instructions  Blood pressures are looking good today. Continue current medicines including aricept (donepezil) 5mg  daily and aspirin 325mg  daily. Call to ensure dermatology and neurology have cancelled appointments in June. Return in 4 months for follow up visit.  Follow up plan: Return in about 4 months (around 12/26/2019) for follow up visit.  Ria Bush, MD

## 2019-08-25 NOTE — Assessment & Plan Note (Addendum)
Residual LUE hemiparesis. Recently saw neurology, continues full dose aspirin with statin.

## 2019-08-25 NOTE — Assessment & Plan Note (Signed)
aricept recently started by neurology for mixed dementia. They decline higher dose at this time. Seem to be tolerating well.

## 2019-08-25 NOTE — Patient Instructions (Addendum)
Blood pressures are looking good today. Continue current medicines including aricept (donepezil) 5mg  daily and aspirin 325mg  daily. Call to ensure dermatology and neurology have cancelled appointments in June. Return in 4 months for follow up visit.

## 2019-08-25 NOTE — Telephone Encounter (Signed)
I see where pt had appt with Dr Darnell Level today. Vergie nurse with Palliative care left v/m that she was to call with update on pt. Today pt was alert, orientated and ready to eat lunch.  TPR 98.4 - 63 - 16; BP 110/70 pulse ox at room air was 98 %. No blurred vision,. No residual effects from possible TIA note. Wonders if pt overheated while exercising. Next month Darcus Pester will do FU with pt and speak with social worker to see if pt qualifies for [place] aide and attended services. FYI to Dr Darnell Level.

## 2019-08-25 NOTE — Assessment & Plan Note (Signed)
Recent episode of unclear etiology, possibly related to working out in the heat (bike was outdoors) - they are planning to bring exercise bicycle indoors with summer months coming up. Workup overall reassuring by ER yesterday. Update if recurrent episode despite bringing exercise indoors.

## 2019-08-25 NOTE — Assessment & Plan Note (Signed)
Awaiting evaluation for further assistance through [place].

## 2019-08-25 NOTE — Assessment & Plan Note (Signed)
Chronic, stable on current regimen.  

## 2019-08-25 NOTE — Assessment & Plan Note (Signed)
Improvement noted with HH PT - continue this. No restrictions for now, but I did ask PT to check vitals before and after therapy sessions - and if normal, continue without restrictions.

## 2019-08-25 NOTE — Telephone Encounter (Signed)
Noted! Thank you

## 2019-08-27 DIAGNOSIS — C9111 Chronic lymphocytic leukemia of B-cell type in remission: Secondary | ICD-10-CM | POA: Diagnosis not present

## 2019-08-27 DIAGNOSIS — I2510 Atherosclerotic heart disease of native coronary artery without angina pectoris: Secondary | ICD-10-CM | POA: Diagnosis not present

## 2019-08-27 DIAGNOSIS — I130 Hypertensive heart and chronic kidney disease with heart failure and stage 1 through stage 4 chronic kidney disease, or unspecified chronic kidney disease: Secondary | ICD-10-CM | POA: Diagnosis not present

## 2019-08-27 DIAGNOSIS — N183 Chronic kidney disease, stage 3 unspecified: Secondary | ICD-10-CM | POA: Diagnosis not present

## 2019-08-27 DIAGNOSIS — I5032 Chronic diastolic (congestive) heart failure: Secondary | ICD-10-CM | POA: Diagnosis not present

## 2019-08-27 DIAGNOSIS — I69354 Hemiplegia and hemiparesis following cerebral infarction affecting left non-dominant side: Secondary | ICD-10-CM | POA: Diagnosis not present

## 2019-08-31 ENCOUNTER — Encounter: Payer: Self-pay | Admitting: Podiatry

## 2019-08-31 ENCOUNTER — Ambulatory Visit (INDEPENDENT_AMBULATORY_CARE_PROVIDER_SITE_OTHER): Payer: Medicare Other | Admitting: Podiatry

## 2019-08-31 ENCOUNTER — Other Ambulatory Visit: Payer: Medicare Other

## 2019-08-31 ENCOUNTER — Other Ambulatory Visit: Payer: Self-pay

## 2019-08-31 DIAGNOSIS — L03031 Cellulitis of right toe: Secondary | ICD-10-CM | POA: Diagnosis not present

## 2019-08-31 DIAGNOSIS — L03032 Cellulitis of left toe: Secondary | ICD-10-CM

## 2019-08-31 DIAGNOSIS — Z515 Encounter for palliative care: Secondary | ICD-10-CM

## 2019-08-31 MED ORDER — GENTAMICIN SULFATE 0.1 % EX CREA
1.0000 | TOPICAL_CREAM | Freq: Two times a day (BID) | CUTANEOUS | 1 refills | Status: DC
Start: 2019-08-31 — End: 2019-09-01

## 2019-08-31 MED ORDER — DOXYCYCLINE HYCLATE 100 MG PO TABS
100.0000 mg | ORAL_TABLET | Freq: Two times a day (BID) | ORAL | 0 refills | Status: DC
Start: 2019-08-31 — End: 2019-09-01

## 2019-08-31 NOTE — Progress Notes (Signed)
HPI: 86 y.o. male presenting today with his wife for evaluation of redness to the bilateral great toes.  This is been ongoing for approximately the last 6 months now.  The patient does have some pain associated with the toenails.  The patient's wife states that approximately 6 months ago he went to go get a pedicure and that is when the nail started to become red with drainage.  He also noticed that a new nail is growing underneath an old nail.  They present for further treatment evaluation  Past Medical History:  Diagnosis Date  . Actinic keratosis   . Basal cell carcinoma 02/15/2016   Right lateral neck infraauricular of SCC scar  . Basal cell carcinoma 02/15/2016   Left chin  . Basal cell carcinoma 03/08/2014   Right of midline forehead  . CAD (coronary artery disease)    a. 04/2001 S/P CABGx 4;  b. 2008 MV:  EF 63% normal perfusion.  . Carotid stenosis    a. 11/2012 Carotid U/S:  RICA [id number], LICA 123456.  . Chronic kidney disease, stage 3   . CLL (chronic lymphoblastic leukemia) 2009   Stage IV; Dr. Ivor Messier - referred to Dr. Lissa Merlin [facility] 07/2013 now on Rituxan (10/2013) --> stage 0 (09/2014)  . Diastolic CHF ([place])    a. [id number] Echo: EF 55-65%, mild conc LVH, Gr 1 DD, mild AS, triv AI, mildly dil Ao root (3.5 cm).  . GERD (gastroesophageal reflux disease) 1990s  . History of CVA (cerebrovascular accident) 2015   by MRI - remote L internal capsule  . History of herpes genitalis    valtrex daily  . Hyperlipemia 2002  . Hypertension 2002  . Mass of submandibular region 2015   referred to gen surg after chemo  . Mild aortic stenosis    a. 07/2011 Echo: Mild AS, triv AI.  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 Skin cancer   . Squamous cell carcinoma of skin 10/05/2014   Right mid posterior ear  . Stroke ([place])   . Thrombocytopenia ([place])    outpatient goals Hgb >9, plt >20  . TIA (transient ischemic attack)    04/2016     Physical Exam: General: The  patient is alert and oriented x3 in no acute distress.  Dermatology: Dystrophic nails noted to the bilateral great toes with evidence of a new nail growth that is pushing out old nail growth.  The base of the nail plates bilateral do appear to be erythematous.  There is actually some drainage noted to the left hallux nail plate base.  Skin is warm, dry and supple bilateral lower extremities.   Vascular: Palpable pedal pulses bilaterally. Capillary refill within normal limits.  Neurological: Epicritic and protective threshold grossly intact bilaterally.   Musculoskeletal Exam: Range of motion within normal limits to all pedal and ankle joints bilateral. Muscle strength 5/5 in all groups bilateral.   Assessment: 1.  Subungual abscess base of the left hallux nail plate 2.  Infection of toenail bilateral great toes.  Left greater than right   Plan of Care:  1. Patient evaluated.  2.  Today explained to the patient and wife that it would be in his best interest to remove the nail plate to the left hallux.  The patient and spouse agree.  The toe was prepped in aseptic manner and digital block was performed consisting of 3 mL of 2% lidocaine plain.  Left hallux nail plate was removed in  toto and dry sterile dressing was applied.  Post care instructions were provided 3.  Prescription for gentamicin cream to apply daily 4.  Prescription for doxycycline 100 mg #20 5.  Return to clinic in 3 weeks.  If there is still some redness with swelling to the right hallux nail plate base it may be in his best interest to have that one removed as well.  We will reassess in 3 weeks.      Edrick Kins, DPM [facility]  Dr. Edrick Kins, DPM    [address], [place] [address]                Office [phone]  Fax [phone]

## 2019-09-01 ENCOUNTER — Telehealth: Payer: Self-pay | Admitting: Podiatry

## 2019-09-01 DIAGNOSIS — I69354 Hemiplegia and hemiparesis following cerebral infarction affecting left non-dominant side: Secondary | ICD-10-CM | POA: Diagnosis not present

## 2019-09-01 DIAGNOSIS — N183 Chronic kidney disease, stage 3 unspecified: Secondary | ICD-10-CM | POA: Diagnosis not present

## 2019-09-01 DIAGNOSIS — I130 Hypertensive heart and chronic kidney disease with heart failure and stage 1 through stage 4 chronic kidney disease, or unspecified chronic kidney disease: Secondary | ICD-10-CM | POA: Diagnosis not present

## 2019-09-01 DIAGNOSIS — C9111 Chronic lymphocytic leukemia of B-cell type in remission: Secondary | ICD-10-CM | POA: Diagnosis not present

## 2019-09-01 DIAGNOSIS — I2510 Atherosclerotic heart disease of native coronary artery without angina pectoris: Secondary | ICD-10-CM | POA: Diagnosis not present

## 2019-09-01 DIAGNOSIS — I5032 Chronic diastolic (congestive) heart failure: Secondary | ICD-10-CM | POA: Diagnosis not present

## 2019-09-01 MED ORDER — DOXYCYCLINE HYCLATE 100 MG PO TABS: 100.0000 mg | ORAL_TABLET | Freq: Two times a day (BID) | ORAL | 0 refills | Status: DC

## 2019-09-01 MED ORDER — GENTAMICIN SULFATE 0.1 % EX CREA: 1.0000 "application " | TOPICAL_CREAM | Freq: Two times a day (BID) | CUTANEOUS | 1 refills | Status: AC

## 2019-09-01 NOTE — Telephone Encounter (Signed)
Pt called stating rx's needed to be [facility] [address] due to [facility] being closed this week.  Prescription for gentamicin cream to apply daily & doxycycline 100 mg #20.

## 2019-09-01 NOTE — Addendum Note (Signed)
Addended by: Graceann Congress D on: 09/01/2019 03:27 PM   Modules accepted: Orders

## 2019-09-02 NOTE — Progress Notes (Signed)
COMMUNITY PALLIATIVE CARE SW NOTE  PATIENT NAME: Jimmy [name] DOB: June 19, 1928 MRN: [id number]  PRIMARY CARE PROVIDER: Ria Bush, MD  RESPONSIBLE PARTY:  Acct ID - Guarantor Home Phone Work Phone Relationship Acct Type  [id number] - Carmelina [phone]  Self P/F     [address], [place], [place] [address]     PLAN OF CARE and INTERVENTIONS:             1. GOALS OF CARE/ ADVANCE CARE PLANNING:  Goal is for patient to keep patient at home where he will be cared for by his wife and private sitter. Patient is a DNR, form is requested. 2. SOCIAL/EMOTIONAL/SPIRITUAL ASSESSMENT/ INTERVENTIONS: SW completed face-to-face visit with patient and his wife-[name] at their home. SW obtained information regarding patient's current status with [place]. SW provided education and literature to patient's wife around [place] benefits for caregivers and spouses. SW assisted patient's wife with completing an on-line [place] application for assistance with private caregivers. Patient and his wife provided social history, medical background and military experience with SW. [name] expressed her goal is to keep patient and home and patient also expressed his desire to remain at home. SW provided supportive presence, active listening, assistance with application process and provided reassurance of support.  3. PATIENT/CAREGIVER EDUCATION/ COPING:  Education provided around [place] benefits. Assistance provided with application process for VA benefits completed. 4. PERSONAL EMERGENCY PLAN:  911 can be 5. COMMUNITY RESOURCES COORDINATION/ HEALTH CARE NAVIGATION:  [name] manages care. Patient has a hired Engineer, production that comes for one hour in morning, one hour in the evening everyday. Patient also has weekly bible study with his friends. Patient's church community and friends have been support.  6. FINANCIAL/LEGAL CONCERNS/INTERVENTIONS:  No financial or legal concerns. However the patient's wife is hoping that patient can receive  caregiver assistance through the [place].      SOCIAL HX:  Social History   Tobacco Use  . Smoking status: Former Smoker    Packs/day: 2.00    Years: 30.00    Pack years: 60.00    Quit date: 02/28/1979    Years since quitting: 40.5  . Smokeless tobacco: Never Used  . Tobacco comment: quit 1980  Substance Use Topics  . Alcohol use: No    Alcohol/week: 0.0 standard drinks    Comment: occasional wine    CODE STATUS: DNR  ADVANCED DIRECTIVES: Yes MOST FORM COMPLETE:  No HOSPICE EDUCATION PROVIDED: No  PPS: Patient is dependent for personal care needs. He can transfer with a hoyer lift. Patient has periods of tearfulness that is able to quickly recover from.  Visit Duration and documentation: 75 minutes.       [address] [place], [place]

## 2019-09-02 NOTE — Telephone Encounter (Signed)
Left message on pt's wife's phone that the [id number] 09/01/2019 at 3:24pm.

## 2019-09-03 DIAGNOSIS — N183 Chronic kidney disease, stage 3 unspecified: Secondary | ICD-10-CM | POA: Diagnosis not present

## 2019-09-03 DIAGNOSIS — C9111 Chronic lymphocytic leukemia of B-cell type in remission: Secondary | ICD-10-CM | POA: Diagnosis not present

## 2019-09-03 DIAGNOSIS — I130 Hypertensive heart and chronic kidney disease with heart failure and stage 1 through stage 4 chronic kidney disease, or unspecified chronic kidney disease: Secondary | ICD-10-CM | POA: Diagnosis not present

## 2019-09-03 DIAGNOSIS — I5032 Chronic diastolic (congestive) heart failure: Secondary | ICD-10-CM | POA: Diagnosis not present

## 2019-09-03 DIAGNOSIS — I2510 Atherosclerotic heart disease of native coronary artery without angina pectoris: Secondary | ICD-10-CM | POA: Diagnosis not present

## 2019-09-03 DIAGNOSIS — I69354 Hemiplegia and hemiparesis following cerebral infarction affecting left non-dominant side: Secondary | ICD-10-CM | POA: Diagnosis not present

## 2019-09-07 DIAGNOSIS — I2510 Atherosclerotic heart disease of native coronary artery without angina pectoris: Secondary | ICD-10-CM | POA: Diagnosis not present

## 2019-09-07 DIAGNOSIS — I5032 Chronic diastolic (congestive) heart failure: Secondary | ICD-10-CM | POA: Diagnosis not present

## 2019-09-07 DIAGNOSIS — I130 Hypertensive heart and chronic kidney disease with heart failure and stage 1 through stage 4 chronic kidney disease, or unspecified chronic kidney disease: Secondary | ICD-10-CM | POA: Diagnosis not present

## 2019-09-07 DIAGNOSIS — N183 Chronic kidney disease, stage 3 unspecified: Secondary | ICD-10-CM | POA: Diagnosis not present

## 2019-09-07 DIAGNOSIS — C9111 Chronic lymphocytic leukemia of B-cell type in remission: Secondary | ICD-10-CM | POA: Diagnosis not present

## 2019-09-07 DIAGNOSIS — I69354 Hemiplegia and hemiparesis following cerebral infarction affecting left non-dominant side: Secondary | ICD-10-CM | POA: Diagnosis not present

## 2019-09-09 MED ORDER — MIDAZOLAM HCL 2 MG/2ML IJ SOLN
INTRAMUSCULAR | Status: AC
  Filled 2019-09-09: qty 2

## 2019-09-09 MED ORDER — FENTANYL CITRATE (PF) 100 MCG/2ML IJ SOLN
INTRAMUSCULAR | Status: AC
  Filled 2019-09-09: qty 2

## 2019-09-09 MED ORDER — PROPOFOL 10 MG/ML IV BOLUS
INTRAVENOUS | Status: AC
  Filled 2019-09-09: qty 20

## 2019-09-10 ENCOUNTER — Other Ambulatory Visit: Payer: Medicare Other

## 2019-09-10 ENCOUNTER — Other Ambulatory Visit: Payer: Self-pay

## 2019-09-10 DIAGNOSIS — I2510 Atherosclerotic heart disease of native coronary artery without angina pectoris: Secondary | ICD-10-CM | POA: Diagnosis not present

## 2019-09-10 DIAGNOSIS — N183 Chronic kidney disease, stage 3 unspecified: Secondary | ICD-10-CM | POA: Diagnosis not present

## 2019-09-10 DIAGNOSIS — I69354 Hemiplegia and hemiparesis following cerebral infarction affecting left non-dominant side: Secondary | ICD-10-CM | POA: Diagnosis not present

## 2019-09-10 DIAGNOSIS — I130 Hypertensive heart and chronic kidney disease with heart failure and stage 1 through stage 4 chronic kidney disease, or unspecified chronic kidney disease: Secondary | ICD-10-CM | POA: Diagnosis not present

## 2019-09-10 DIAGNOSIS — C9111 Chronic lymphocytic leukemia of B-cell type in remission: Secondary | ICD-10-CM | POA: Diagnosis not present

## 2019-09-10 DIAGNOSIS — I5032 Chronic diastolic (congestive) heart failure: Secondary | ICD-10-CM | POA: Diagnosis not present

## 2019-09-10 NOTE — Progress Notes (Unsigned)
PATIENT NAME: Jimmy [name] DOB: 04/02/1928 MRN: [id number]  PRIMARY CARE PROVIDER: Ria Bush, MD  RESPONSIBLE PARTY:  Acct ID - Guarantor Home Phone Work Phone Relationship Acct Type  [id number] - Jimmy Noun678 [phone]  Self P/F     [address], [place], [place] [address]    PLAN OF CARE and INTERVENTIONS:               1.  GOALS OF CARE/ ADVANCE CARE PLANNING:  Remain at home with wife as long as patients care can be managed.                  2.  PATIENT/CAREGIVER EDUCATION:  Education on fall precautions, education on s/s of infection, reviewed meds, support               3.  DISEASE STATUS: SW [place] and RN made scheduled palliative care home visit. Palliative care team met with patient and patients wife Jimmy [name]. Patient sitting in his recliner chair alert and oriented.  Wife reports [facility] informed her today that patients  PT and OT would be stopping.  Wife states [facility] told her that patient needed a break for 3 or 4 weeks. Wife was instructed should she feel patient need to resume PT and OT services she would need to get a new order from MD. Patient denies having pain at the present time. Patient saw Dr. Amalia [name] and had left great toenail removed. [facility] went [facility] per Dr Jimmy [name] recommendation and purchased device to keep patient from dragging left foot. Wife states patient is to see MD again in 2 weeks and patient will have right toenail removed. Patients  appetite is good in patients current weight is 150 lbs. Patient has been sleeping well and patient reports he sleeps a lot during the day. Patients vital signs are stable. Wife reports she is scheduled to make contact with person in [place] who works for the [employer] regarding aid and attendance service for patient. Wife has been paying privately for a caregiver to come in for 1 hour in the AM and PM to assist with getting patient up and getting ready for bed.  Patient reports having shortness of breath  on exertion and a productive cough up clear sputum. Patient has no edema his lower extremities. Wife reports patient was started on doxycycline 100 mg b i d for 10 days due to having toenail removed. Patient has had no other changes in medications per wife. Patient and wife remain in agreement with palliative care services. Patient and wife encouraged to contact palliative care with questions or concerns.                   HISTORY OF PRESENT ILLNESS:  Patient is a 86 year old who resides in home with wife Jimmy [name].  Patient is followed by palliative care and is seen monthly and PRN.  CODE STATUS: DNR  ADVANCED DIRECTIVES: Y MOST FORM: No PPS: 40%   PHYSICAL EXAM:   VITALS: Today's Vitals   09/10/19 1457  BP: 128/68  Pulse: 60  Resp: 16  Temp: 97.8 F (36.6 C)  TempSrc: Temporal  SpO2: 97%  Weight: 150 lb (68 kg)  PainSc: 0-No pain    LUNGS: clear to auscultation  CARDIAC: Cor RRR  EXTREMITIES: none edema SKIN: Skin color, texture, turgor normal. No rashes or lesions  NEURO: positive for gait problems and weakness       Jimmy Simmer, RN

## 2019-09-23 ENCOUNTER — Ambulatory Visit: Payer: Medicare Other | Admitting: Dermatology

## 2019-09-28 ENCOUNTER — Ambulatory Visit (INDEPENDENT_AMBULATORY_CARE_PROVIDER_SITE_OTHER): Payer: Medicare Other | Admitting: Podiatry

## 2019-09-28 ENCOUNTER — Encounter: Payer: Self-pay | Admitting: Podiatry

## 2019-09-28 ENCOUNTER — Other Ambulatory Visit: Payer: Self-pay

## 2019-09-28 DIAGNOSIS — L03031 Cellulitis of right toe: Secondary | ICD-10-CM | POA: Diagnosis not present

## 2019-09-28 DIAGNOSIS — M79676 Pain in unspecified toe(s): Secondary | ICD-10-CM | POA: Diagnosis not present

## 2019-09-28 DIAGNOSIS — B351 Tinea unguium: Secondary | ICD-10-CM

## 2019-09-28 DIAGNOSIS — L03032 Cellulitis of left toe: Secondary | ICD-10-CM

## 2019-09-28 NOTE — Progress Notes (Signed)
HPI: 86 y.o. male presenting today for follow-up evaluation of a total temporary nail avulsion to the left hallux.  Patient states that he is doing much better.  He completed his oral antibiotic, doxycycline 100 mg, as prescribed.  He is also been applying the antibiotic cream gentamicin as prescribed.  Patient states improvement with pain.  He presents for further treatment evaluation  Past Medical History:  Diagnosis Date  . Actinic keratosis   . Basal cell carcinoma 02/15/2016   Right lateral neck infraauricular of SCC scar  . Basal cell carcinoma 02/15/2016   Left chin  . Basal cell carcinoma 03/08/2014   Right of midline forehead  . CAD (coronary artery disease)    a. 04/2001 S/P CABGx 4;  b. 2008 MV:  EF 63% normal perfusion.  . Carotid stenosis    a. 11/2012 Carotid U/S:  RICA 84-13%, LICA 24-40%.  . Chronic kidney disease, stage 3   . CLL (chronic lymphoblastic leukemia) 2009   Stage IV; Dr. Ivor Messier - referred to Dr. Lissa Merlin [facility] 07/2013 now on Rituxan (10/2013) --> stage 0 (09/2014)  . Diastolic CHF ([place])    a. 04/270 Echo: EF 55-65%, mild conc LVH, Gr 1 DD, mild AS, triv AI, mildly dil Ao root (3.5 cm).  . GERD (gastroesophageal reflux disease) 1990s  . History of CVA (cerebrovascular accident) 2015   by MRI - remote L internal capsule  . History of herpes genitalis    valtrex daily  . Hyperlipemia 2002  . Hypertension 2002  . Mass of submandibular region 2015   referred to gen surg after chemo  . Mild aortic stenosis    a. 07/2011 Echo: Mild AS, triv AI.  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 Skin cancer   . Squamous cell carcinoma of skin 10/05/2014   Right mid posterior ear  . Stroke ([place])   . Thrombocytopenia ([place])    outpatient goals Hgb >9, plt >20  . TIA (transient ischemic attack)    04/2016     Physical Exam: General: The patient is alert and oriented x3 in no acute distress.  Dermatology: Skin is warm, dry and supple bilateral  lower extremities. Negative for open lesions or macerations.  Vascular: Palpable pedal pulses bilaterally. No edema or erythema noted. Capillary refill within normal limits.  Evidence of total temporary nail avulsion that was performed last visit, 08/31/2019, to the left hallux.  Appears to be healing routinely and as expected.  Improvement of the periungual cellulitis.  The erythema and edema and cellulitis to the right hallux periungual borders do appear to be significantly improved.  Neurological: Epicritic and protective threshold grossly intact bilaterally.   Musculoskeletal Exam: Range of motion within normal limits to all pedal and ankle joints bilateral. Muscle strength 5/5 in all groups bilateral.   Assessment: 1.  Status post total temporary nail avulsion left hallux 2.  Cellulitis right hallux-resolved   Plan of Care:  1. Patient evaluated. .  2.  Continue antibiotic gentamicin cream daily to the bilateral great toes for an additional week 3.  We will continue to simply observe the right hallux nail plate and right great toe since it is improved today 4.  Mechanical debridement of nails 1-5 was performed using a nail nipper without incident or bleeding 5.  Return to clinic in 3 months      Edrick Kins, DPM [facility]  Dr. Edrick Kins, DPM  [address], [place] [address]                Office [phone]  Fax [phone]

## 2019-10-07 ENCOUNTER — Telehealth: Payer: Self-pay | Admitting: Family Medicine

## 2019-10-07 MED ORDER — SIMVASTATIN 20 MG PO TABS: 20.0000 mg | ORAL_TABLET | Freq: Every day | ORAL | 0 refills | Status: AC

## 2019-10-07 NOTE — Telephone Encounter (Signed)
Pt's wife needs a refill for pt's Zocor prescription. She said that there was a change from 10mg  to 20mg  that she wasn't aware of but she still needs a new prescription sent to [facility].

## 2019-10-07 NOTE — Telephone Encounter (Signed)
E-scribed refill.  Plz schedule wellness, cpe and lab visits.  

## 2019-10-07 NOTE — Addendum Note (Signed)
Addended by: Brenton Grills on: 10/31/7076 67:54 PM   Modules accepted: Orders

## 2019-10-11 ENCOUNTER — Telehealth: Payer: Self-pay | Admitting: Family Medicine

## 2019-10-11 ENCOUNTER — Other Ambulatory Visit: Payer: Medicare Other

## 2019-10-11 ENCOUNTER — Other Ambulatory Visit: Payer: Self-pay

## 2019-10-11 DIAGNOSIS — F0150 Vascular dementia without behavioral disturbance: Secondary | ICD-10-CM

## 2019-10-11 DIAGNOSIS — G8114 Spastic hemiplegia affecting left nondominant side: Secondary | ICD-10-CM

## 2019-10-11 DIAGNOSIS — I69359 Hemiplegia and hemiparesis following cerebral infarction affecting unspecified side: Secondary | ICD-10-CM

## 2019-10-11 DIAGNOSIS — Z515 Encounter for palliative care: Secondary | ICD-10-CM

## 2019-10-11 DIAGNOSIS — I6930 Unspecified sequelae of cerebral infarction: Secondary | ICD-10-CM

## 2019-10-11 DIAGNOSIS — R296 Repeated falls: Secondary | ICD-10-CM

## 2019-10-11 DIAGNOSIS — R531 Weakness: Secondary | ICD-10-CM

## 2019-10-11 NOTE — Progress Notes (Signed)
COMMUNITY PALLIATIVE CARE SW NOTE  PATIENT NAME: Jimmy [name] DOB: [birth year] MRN: [id number]  PRIMARY CARE PROVIDER: Ria Bush, MD  RESPONSIBLE PARTY:  Acct ID - Guarantor Home Phone Work Phone Relationship Acct Type  [id number] - Carmelina [id number]  Self P/F     [address], [place], [place] [address]     PLAN OF CARE and INTERVENTIONS:             1. GOALS OF CARE/ ADVANCE CARE PLANNING:  Goal is for patient to keep patient at home where he will be cared for by his wife and private sitter. Patient is a DNR. 2. SOCIAL/EMOTIONAL/SPIRITUAL ASSESSMENT/ INTERVENTIONS: SW met with patient and patients wife, [name] for scheduled visit. Patient was sitting up in recliner chair. Wife shared that she outreached patients PCP this morning to request a new home health PT referral due to patient declining physically again. Wife shared that patients private aid assists patient with exercising on in home bicycle every night before assisting patient to bed. Patient shared that he has a good appetite and eats fine, but does have some coughing while eating. Patient sleeps well and when shares that he snores and only wakes up during the night to use the urinal. Patient shared that he is not in any pain. Patient has completed ABT for R toe infection and puts a cream on it daily after showering. Patient shared that his L great toe nail is improving. Wife shares no recent medication changes. Wife is still working on aid and Careers adviser and is waiting on a call from [place] at [place] to schedule an appointment to finish process. Wife shared that she and patient has a Biochemist, clinical through [employer] and inquired about assistance in gaining assistance from them. SW called insurance policy with patient and wife and was told that at a claim had been placed Sept 2020 - wife confirmed this and stated that she never heard anything after submitting the claim for reimbursement  assistance. Insurance rep stated that their system  Was down at the moment and she will continue to see if the original claim can be reopened or if a new claim will need to be made, they will outreach wife once system is back up with decision. SW informed wife that [employer] policy should reimburse patient and wife for paying out of pocket for personal care aid. SW informed wife that SW will assist with process if needed, wife shared that she will outreach SW if needed. Patient and wife had no other concerns for SW at this time and are encouraged to outreach palliative care if needed. Will continue to follow.  3. PATIENT/CAREGIVER EDUCATION/ COPING:  Patient was alert and engaged in visit. Patient has periods of tearfulness that is able to quickly recover from.Patient enjoys bible study with wife. Wife and church family are supportive. 4. PERSONAL EMERGENCY PLAN:  Patient will call 9-1-1 if emergency.  5. COMMUNITY RESOURCES COORDINATION/ HEALTH CARE NAVIGATION:  Wife [name] manages care. Patient has personal care aide daily in the AM and PM. Wife has made request for continued home health PT services.  6. FINANCIAL/LEGAL CONCERNS/INTERVENTIONS:  None. Wife has completed [place]attendance [place] application to assist with paying for private duty aid care. Patient and wife are also in the process of filing a claim through their [employer] policy to obtain reimbursement guidance on paying out of pocket for private aide.      SOCIAL HX:  Social History   Tobacco Use  .  Smoking status: Former Smoker    Packs/day: 2.00    Years: 30.00    Pack years: 60.00    Quit date: 02/28/1979    Years since quitting: 40.6  . Smokeless tobacco: Never Used  . Tobacco comment: quit 1980  Substance Use Topics  . Alcohol use: No    Alcohol/week: 0.0 standard drinks    Comment: occasional wine    CODE STATUS: DNR  ADVANCED DIRECTIVES: N MOST FORM COMPLETE: N HOSPICE EDUCATION PROVIDED: N   PPS: Patient is dependent for personal  care needs. He can transfer with a hoyer lift and SPT with RW. Wife uses transfer [facility] with patient.    Time spent: vists lasted 65 min.   Doreene Eland, [place]

## 2019-10-11 NOTE — Telephone Encounter (Signed)
Noted  

## 2019-10-11 NOTE — Telephone Encounter (Signed)
Pt's wife states [facility] stopped coming for physical therapy. They were unaware that the certification period ended in June. She is wondering if there is a way to re-certify. She states he was doing so well while doing PT but has gotten worse since they are no longer helping him. Please advise.

## 2019-10-11 NOTE — Telephone Encounter (Signed)
Called and got patient scheduled for CPE, AWV and labs. 

## 2019-10-12 NOTE — Addendum Note (Signed)
Addended by: Ria Bush on: 10/12/2019 05:04 PM   Modules accepted: Orders

## 2019-10-12 NOTE — Telephone Encounter (Addendum)
Will see if we can extend HHPT due to worsening symptoms per wife.  New order placed

## 2019-10-13 NOTE — Telephone Encounter (Signed)
Spoke with pt's wife, [name] (on dpr), relaying Dr. Synthia Innocent message.  She expresses her thanks.

## 2019-10-14 DIAGNOSIS — F0150 Vascular dementia without behavioral disturbance: Secondary | ICD-10-CM | POA: Diagnosis not present

## 2019-10-14 DIAGNOSIS — I69311 Memory deficit following cerebral infarction: Secondary | ICD-10-CM | POA: Diagnosis not present

## 2019-10-14 DIAGNOSIS — R296 Repeated falls: Secondary | ICD-10-CM | POA: Diagnosis not present

## 2019-10-14 DIAGNOSIS — I69354 Hemiplegia and hemiparesis following cerebral infarction affecting left non-dominant side: Secondary | ICD-10-CM | POA: Diagnosis not present

## 2019-10-14 DIAGNOSIS — I129 Hypertensive chronic kidney disease with stage 1 through stage 4 chronic kidney disease, or unspecified chronic kidney disease: Secondary | ICD-10-CM | POA: Diagnosis not present

## 2019-10-14 DIAGNOSIS — N183 Chronic kidney disease, stage 3 unspecified: Secondary | ICD-10-CM | POA: Diagnosis not present

## 2019-10-14 DIAGNOSIS — G309 Alzheimer's disease, unspecified: Secondary | ICD-10-CM | POA: Diagnosis not present

## 2019-10-18 DIAGNOSIS — F0150 Vascular dementia without behavioral disturbance: Secondary | ICD-10-CM | POA: Diagnosis not present

## 2019-10-18 DIAGNOSIS — I129 Hypertensive chronic kidney disease with stage 1 through stage 4 chronic kidney disease, or unspecified chronic kidney disease: Secondary | ICD-10-CM | POA: Diagnosis not present

## 2019-10-18 DIAGNOSIS — G309 Alzheimer's disease, unspecified: Secondary | ICD-10-CM | POA: Diagnosis not present

## 2019-10-18 DIAGNOSIS — R296 Repeated falls: Secondary | ICD-10-CM | POA: Diagnosis not present

## 2019-10-18 DIAGNOSIS — I69311 Memory deficit following cerebral infarction: Secondary | ICD-10-CM | POA: Diagnosis not present

## 2019-10-18 DIAGNOSIS — I69354 Hemiplegia and hemiparesis following cerebral infarction affecting left non-dominant side: Secondary | ICD-10-CM | POA: Diagnosis not present

## 2019-10-18 NOTE — Progress Notes (Signed)
Date:  10/18/2019   ID:  Jimmy [name], DOB 05-14-1928, MRN [id number]  Patient Location:  [place] [address]   Provider location:   [facility], [place] office  PCP:  Ria Bush, MD  Cardiologist:  [facility]   Chief Complaint  Patient presents with  . other    6 month follow up. Meds reviewed by the pt.'s wife. "doing well."      History of Present Illness:    Jimmy [name] is a 86 y.o. male  past medical history of CLL (WBC ~ 50k), completed 6 rounds of treatment, stopped in December 2015. Smoked 28 yrs coronary artery disease, status post bypass surgery in 2001 Myoview in September 2008,  ejection fraction of 63% with no ischemia or infarct Negative stress test August 2015 hyperlipidemia,  hypertension,  Carotid u/s 12/2015  40 to 59% disease on the right Less than 39% on the left minimal bilateral renal artery stenosis.   6 rounds of treatment of CLL, finished in December.  CVA 04/2018 He presents for followup of his coronary artery disease  On his prior clinic visit reported having stroke January 2020 Acute right basal ganglia hemorrhage secondary to hypertensive crisis April 04, 2018 Symptoms of sudden onset of left-sided weakness and dysarthria.  Cranial CT scan showed an 8.8 mL acute intraparenchymal hematoma centered at the posterior right lentiform nucleus without significant regional mass effect.  MRA negative.   At that time he had left-sided deficits, left hand was useless, left leg could kick out, was walking okay but slowly, pain in the left thigh when standing  His weight had dropped 30 pounds, blood pressure pulse stable Was missing a lot of meals, poor appetite Was being strict with his diet  Presents with his wife today, reports gait instability, recent falls, some bruising, no traumatic trauma requiring evaluation Better movement of his left hand with physical therapy Works with an  aide every night does exercises Wife frustrated at times he will sleep till 1:00, not do his exercises on a regular basis She reports 1 time seemed like he wanted to give up  Appears to have 10 pound weight gain over the past 6 months May be missing meals,  Maintained on high-dose aspirin 325, denies any GI symptoms  Denies any leg swelling, abdominal bloating, PND orthopnea No orthostasis symptoms  Labs reviewed with him Total cholesterol 118  EKG personally reviewed by myself on todays visit Shows normal sinus rhythm, old inferior MI, No change from prior EKGs  Past medical history reviewed Bilateral Carotid Dopplers -04/05/2018 Right Carotid: Velocities in the right ICA are consistent with a 40-59% stenosis. Left Carotid: Velocities in the left ICA are consistent with a 1-39% stenosis. Vertebrals: Bilateral vertebral arteries demonstrate antegrade flow.  Jan 2018, possible TIA visual problem, Speech issue He was not given TPA MRI documented stroke, old D/c on 04/09/2016  MRA: Brain  2018 Chronic microvascular ischemia and old left basal ganglia lacunar infarct.  completed 6 rounds of treatment, stopped in December 2015.  Previous echocardiogram showing normal ejection fraction, mild aortic valve stenosis   Prior CV studies:   The following studies were reviewed today:   Past Medical History:  Diagnosis Date  . Actinic keratosis   . Basal cell carcinoma 02/15/2016   Right lateral neck infraauricular of SCC scar  . Basal cell carcinoma 02/15/2016   Left chin  . Basal cell carcinoma 03/08/2014   Right of  midline forehead  . CAD (coronary artery disease)    a. 04/2001 S/P CABGx 4;  b. 2008 MV:  EF 63% normal perfusion.  . Carotid stenosis    a. 11/2012 Carotid U/S:  RICA 12-87%, LICA 86-76%.  . Chronic kidney disease, stage 3   . CLL (chronic lymphoblastic leukemia) 2009   Stage IV; Dr. Ivor Messier - referred to Dr. Lissa Merlin [facility] 07/2013 now on Rituxan (10/2013) -->  stage 0 (09/2014)  . Diastolic CHF ([place])    a. 09/2092 Echo: EF 55-65%, mild conc LVH, Gr 1 DD, mild AS, triv AI, mildly dil Ao root (3.5 cm).  . GERD (gastroesophageal reflux disease) 1990s  . History of CVA (cerebrovascular accident) 2015   by MRI - remote L internal capsule  . History of herpes genitalis    valtrex daily  . Hyperlipemia 2002  . Hypertension 2002  . Mass of submandibular region 2015   referred to gen surg after chemo  . Mild aortic stenosis    a. 07/2011 Echo: Mild AS, triv AI.  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 Skin cancer   . Squamous cell carcinoma of skin 10/05/2014   Right mid posterior ear  . Stroke ([place])   . Thrombocytopenia ([place])    outpatient goals Hgb >9, plt >20  . TIA (transient ischemic attack)    04/2016   Past Surgical History:  Procedure Laterality Date  . ANGIOPLASTY  1998  . CATARACT EXTRACTION, BILATERAL     R 1/09, L 8/09  . COLONOSCOPY  11/29/1987   Normal  . COLONOSCOPY  02/07/2003   Hemm. Internal, focal proctitis, negative biopsy  . COLONOSCOPY  12/12/2004   Internal external hemorrhoids, +proctits, negative biopsy  . CORONARY ANGIOPLASTY  1998  . CORONARY ARTERY BYPASS GRAFT  04/23/2001   x4, Dr. Pia Mau  . ESOPHAGOGASTRODUODENOSCOPY  11/29/1987   Gastritis  . ETT  12/10/2006   Persantine myoview nml  . HEMORROIDECTOMY  1954   Fissure repair, [place]  . HEPATIC ARTERY ANGIOPLASTY  1954   [place], [place]  . KIDNEY STONE SURGERY  1977   Dr Redmond Baseman  . LITHOTRIPSY  1990s   Multiple  . US ECHOCARDIOGRAPHY  07/2011   nl sys fxn, EF 55-60%, grade 1 diast dysfunction, mild AS, mildly elevated PA pressure     No outpatient medications have been marked as taking for the 10/19/19 encounter (Appointment) with Minna Merritts, MD.     Allergies:   Collodion, New skin, and Tape   Social History   Tobacco Use  . Smoking status: Former Smoker    Packs/day: 2.00    Years: 30.00    Pack years: 60.00     Quit date: 02/28/1979    Years since quitting: 40.6  . Smokeless tobacco: Never Used  . Tobacco comment: quit 1980  Vaping Use  . Vaping Use: Never used  Substance Use Topics  . Alcohol use: No    Alcohol/week: 0.0 standard drinks    Comment: occasional wine  . Drug use: Never     Current Outpatient Medications on File Prior to Visit  Medication Sig Dispense Refill  . acetaminophen (TYLENOL) 325 MG tablet Take 2 tablets (650 mg total) by mouth every 4 (four) hours as needed for mild pain (or temp > 37.5 C (99.5 F)).    [name] allopurinol (ZYLOPRIM) 300 MG tablet TAKE 1 TABLET DAILY (Patient taking differently: Take 300 mg by mouth daily. ) 90 tablet  3  . aspirin EC 325 MG EC tablet Take 1 tablet (325 mg total) by mouth daily. 180 tablet 0  . cholecalciferol (VITAMIN D) 1000 units tablet Take 2,000 Units by mouth daily.    . diphenhydrAMINE (BENADRYL ALLERGY) 25 MG tablet Take 1 tablet (25 mg total) by mouth at bedtime as needed for sleep.    [name] donepezil (ARICEPT) 5 MG tablet Take 5 mg by mouth daily.    [name] doxycycline (VIBRA-TABS) 100 MG tablet Take 1 tablet (100 mg total) by mouth 2 (two) times daily. 20 tablet 0  . gentamicin cream (GARAMYCIN) 0.1 % Apply 1 application topically 2 (two) times daily. 30 g 1  . lisinopril (ZESTRIL) 5 MG tablet TAKE 1 TABLET DAILY (NEW DOSE) (Patient taking differently: Take 5 mg by mouth daily. ) 90 tablet 3  . nitroGLYCERIN (NITROSTAT) 0.4 MG SL tablet Place 1 tablet (0.4 mg total) under the tongue every 5 (five) minutes as needed for chest pain (max 3 doses in 15 minutes). 25 tablet 3  . polyethylene glycol powder (GLYCOLAX/MIRALAX) powder Take 17 g by mouth every other day.     . simvastatin (ZOCOR) 20 MG tablet Take 1 tablet (20 mg total) by mouth at bedtime. 90 tablet 0  . valACYclovir (VALTREX) 500 MG tablet TAKE 1 TABLET DAILY (Patient taking differently: Take 500 mg by mouth daily. ) 90 tablet 3  . vitamin B-12 (CYANOCOBALAMIN) 1000 MCG tablet Take 1  tablet (1,000 mcg total) by mouth daily.     No current facility-administered medications on file prior to visit.     Family Hx: The patient's family history includes Alcohol abuse in his brother; Depression in his daughter; Diabetes in his brother and another family member; Heart attack in his brother; Heart disease in his mother; Hyperlipidemia in his father and mother; Hypertension in his father and mother; Kidney cancer in his sister; Stroke in his brother.  ROS:   Please see the history of present illness.    Review of Systems  Constitutional: Negative.   HENT: Negative.   Respiratory: Negative.   Cardiovascular: Negative.   Gastrointestinal: Negative.   Musculoskeletal: Negative.   Neurological: Negative.   Psychiatric/Behavioral: Negative.   All other systems reviewed and are negative.    Labs/Other Tests and Data Reviewed:    Recent Labs: 10/27/2018: TSH 4.33 08/23/2019: ALT 16; BUN 19; Creatinine, Ser 1.32; Hemoglobin 14.2; Platelets 176; Potassium 3.8; Sodium 139   Recent Lipid Panel Lab Results  Component Value Date/Time   CHOL 118 08/28/2018 03:45 AM   TRIG 135 08/28/2018 03:45 AM   HDL 35 (L) 08/28/2018 03:45 AM   CHOLHDL 3.4 08/28/2018 03:45 AM   LDLCALC 56 08/28/2018 03:45 AM    Wt Readings from Last 3 Encounters:  09/10/19 150 lb (68 kg)  08/25/19 150 lb (68 kg)  08/25/19 147 lb (66.7 kg)     Exam:    Vital Signs: Vital signs may also be detailed in the HPI BP 122/68 (BP Location: Left Arm, Patient Position: Sitting, Cuff Size: Normal)   Pulse 80   Ht 5\' 10"  (1.778 m)   Wt 144 lb (65.3 kg)   SpO2 98%   BMI 20.66 kg/m  Constitutional:  oriented to person, place, and time. No distress.  Thin, presenting in a wheelchair HENT:  Head: Grossly normal Eyes:  no discharge. No scleral icterus.  Neck: No JVD, no carotid bruits  Cardiovascular: Regular rate and rhythm, no murmurs appreciated Pulmonary/Chest: Clear to auscultation bilaterally,  no wheezes  or rails Abdominal: Soft.  no distension.  no tenderness.  Musculoskeletal: Normal range of motion Neurological:  normal muscle tone. Coordination normal. No atrophy Weakness left hand, left leg Skin: Skin warm and dry Psychiatric: normal affect, pleasant   ASSESSMENT & PLAN:    PAD (peripheral artery disease) (HCC) Lipids at goal, mild to moderate carotid disease Maintained on high-dose aspirin for TIA/ stroke x4 Denies GI issues  Atherosclerosis of coronary artery bypass graft of native heart with stable angina pectoris ([place]) Currently with no symptoms of angina. No further workup at this time. Continue current medication regimen. Not very active at baseline, overall everything stable  Essential hypertension Blood pressure is well controlled on today's visit. No changes made to the medications.  With weight loss will watch for hypotension  Mixed hyperlipidemia Numbers at goal, no medication changes made  Chronic lymphocytic leukemia, Rai stage 0 ([place]) Prior chemotherapy Followed by hematology  Cerebrovascular accident (CVA) due to other mechanism [facility]) Prior stroke 2018 with recurrent stroke January 2020 Continues with physical therapy, discussed his recovery in detail Discussed strategies for maintaining his strength, and ability to walk Encouraged need to do exercises daily Currently working with an aide every evening Likely some component of depression, Encouraged him not to give up in 2 keep pursuing exercises daily    Total encounter time more than 25 minutes  Greater than 50% was spent in counseling and coordination of care with the patient   Signed, Ida Rogue, MD  10/18/2019 6:54 PM    [place] Office [address], [place], [place] [address]

## 2019-10-19 ENCOUNTER — Ambulatory Visit (INDEPENDENT_AMBULATORY_CARE_PROVIDER_SITE_OTHER): Payer: Medicare Other | Admitting: Cardiovascular Disease

## 2019-10-19 ENCOUNTER — Encounter: Payer: Self-pay | Admitting: Cardiovascular Disease

## 2019-10-19 ENCOUNTER — Other Ambulatory Visit: Payer: Self-pay

## 2019-10-19 VITALS — BP 122/68 | HR 80 | Ht 70.0 in | Wt 144.0 lb

## 2019-10-19 DIAGNOSIS — I25118 Atherosclerotic heart disease of native coronary artery with other forms of angina pectoris: Secondary | ICD-10-CM

## 2019-10-19 DIAGNOSIS — I10 Essential (primary) hypertension: Secondary | ICD-10-CM | POA: Diagnosis not present

## 2019-10-19 DIAGNOSIS — I739 Peripheral vascular disease, unspecified: Secondary | ICD-10-CM

## 2019-10-19 DIAGNOSIS — I5032 Chronic diastolic (congestive) heart failure: Secondary | ICD-10-CM

## 2019-10-19 DIAGNOSIS — I6521 Occlusion and stenosis of right carotid artery: Secondary | ICD-10-CM | POA: Diagnosis not present

## 2019-10-19 DIAGNOSIS — Z951 Presence of aortocoronary bypass graft: Secondary | ICD-10-CM

## 2019-10-19 DIAGNOSIS — I639 Cerebral infarction, unspecified: Secondary | ICD-10-CM | POA: Diagnosis not present

## 2019-10-19 NOTE — Patient Instructions (Signed)

## 2019-10-20 DIAGNOSIS — I129 Hypertensive chronic kidney disease with stage 1 through stage 4 chronic kidney disease, or unspecified chronic kidney disease: Secondary | ICD-10-CM | POA: Diagnosis not present

## 2019-10-20 DIAGNOSIS — I69354 Hemiplegia and hemiparesis following cerebral infarction affecting left non-dominant side: Secondary | ICD-10-CM | POA: Diagnosis not present

## 2019-10-20 DIAGNOSIS — F0150 Vascular dementia without behavioral disturbance: Secondary | ICD-10-CM | POA: Diagnosis not present

## 2019-10-20 DIAGNOSIS — G309 Alzheimer's disease, unspecified: Secondary | ICD-10-CM | POA: Diagnosis not present

## 2019-10-20 DIAGNOSIS — R296 Repeated falls: Secondary | ICD-10-CM | POA: Diagnosis not present

## 2019-10-20 DIAGNOSIS — I69311 Memory deficit following cerebral infarction: Secondary | ICD-10-CM | POA: Diagnosis not present

## 2019-10-21 DIAGNOSIS — I129 Hypertensive chronic kidney disease with stage 1 through stage 4 chronic kidney disease, or unspecified chronic kidney disease: Secondary | ICD-10-CM | POA: Diagnosis not present

## 2019-10-21 DIAGNOSIS — G309 Alzheimer's disease, unspecified: Secondary | ICD-10-CM | POA: Diagnosis not present

## 2019-10-21 DIAGNOSIS — N183 Chronic kidney disease, stage 3 unspecified: Secondary | ICD-10-CM

## 2019-10-21 DIAGNOSIS — I69311 Memory deficit following cerebral infarction: Secondary | ICD-10-CM | POA: Diagnosis not present

## 2019-10-21 DIAGNOSIS — F0150 Vascular dementia without behavioral disturbance: Secondary | ICD-10-CM | POA: Diagnosis not present

## 2019-10-21 DIAGNOSIS — I69354 Hemiplegia and hemiparesis following cerebral infarction affecting left non-dominant side: Secondary | ICD-10-CM | POA: Diagnosis not present

## 2019-10-21 DIAGNOSIS — R296 Repeated falls: Secondary | ICD-10-CM | POA: Diagnosis not present

## 2019-10-25 DIAGNOSIS — R296 Repeated falls: Secondary | ICD-10-CM | POA: Diagnosis not present

## 2019-10-25 DIAGNOSIS — I69354 Hemiplegia and hemiparesis following cerebral infarction affecting left non-dominant side: Secondary | ICD-10-CM | POA: Diagnosis not present

## 2019-10-25 DIAGNOSIS — I129 Hypertensive chronic kidney disease with stage 1 through stage 4 chronic kidney disease, or unspecified chronic kidney disease: Secondary | ICD-10-CM | POA: Diagnosis not present

## 2019-10-25 DIAGNOSIS — G309 Alzheimer's disease, unspecified: Secondary | ICD-10-CM | POA: Diagnosis not present

## 2019-10-25 DIAGNOSIS — I69311 Memory deficit following cerebral infarction: Secondary | ICD-10-CM | POA: Diagnosis not present

## 2019-10-25 DIAGNOSIS — F0150 Vascular dementia without behavioral disturbance: Secondary | ICD-10-CM | POA: Diagnosis not present

## 2019-10-27 DIAGNOSIS — I69311 Memory deficit following cerebral infarction: Secondary | ICD-10-CM | POA: Diagnosis not present

## 2019-10-27 DIAGNOSIS — G309 Alzheimer's disease, unspecified: Secondary | ICD-10-CM | POA: Diagnosis not present

## 2019-10-27 DIAGNOSIS — F0150 Vascular dementia without behavioral disturbance: Secondary | ICD-10-CM | POA: Diagnosis not present

## 2019-10-27 DIAGNOSIS — R296 Repeated falls: Secondary | ICD-10-CM | POA: Diagnosis not present

## 2019-10-27 DIAGNOSIS — I69354 Hemiplegia and hemiparesis following cerebral infarction affecting left non-dominant side: Secondary | ICD-10-CM | POA: Diagnosis not present

## 2019-10-27 DIAGNOSIS — I129 Hypertensive chronic kidney disease with stage 1 through stage 4 chronic kidney disease, or unspecified chronic kidney disease: Secondary | ICD-10-CM | POA: Diagnosis not present

## 2019-11-01 DIAGNOSIS — G309 Alzheimer's disease, unspecified: Secondary | ICD-10-CM | POA: Diagnosis not present

## 2019-11-01 DIAGNOSIS — F0150 Vascular dementia without behavioral disturbance: Secondary | ICD-10-CM | POA: Diagnosis not present

## 2019-11-01 DIAGNOSIS — R296 Repeated falls: Secondary | ICD-10-CM | POA: Diagnosis not present

## 2019-11-01 DIAGNOSIS — I129 Hypertensive chronic kidney disease with stage 1 through stage 4 chronic kidney disease, or unspecified chronic kidney disease: Secondary | ICD-10-CM | POA: Diagnosis not present

## 2019-11-01 DIAGNOSIS — I69354 Hemiplegia and hemiparesis following cerebral infarction affecting left non-dominant side: Secondary | ICD-10-CM | POA: Diagnosis not present

## 2019-11-01 DIAGNOSIS — I69311 Memory deficit following cerebral infarction: Secondary | ICD-10-CM | POA: Diagnosis not present

## 2019-11-03 DIAGNOSIS — I69311 Memory deficit following cerebral infarction: Secondary | ICD-10-CM | POA: Diagnosis not present

## 2019-11-03 DIAGNOSIS — F0150 Vascular dementia without behavioral disturbance: Secondary | ICD-10-CM | POA: Diagnosis not present

## 2019-11-03 DIAGNOSIS — G309 Alzheimer's disease, unspecified: Secondary | ICD-10-CM | POA: Diagnosis not present

## 2019-11-03 DIAGNOSIS — R296 Repeated falls: Secondary | ICD-10-CM | POA: Diagnosis not present

## 2019-11-03 DIAGNOSIS — I129 Hypertensive chronic kidney disease with stage 1 through stage 4 chronic kidney disease, or unspecified chronic kidney disease: Secondary | ICD-10-CM | POA: Diagnosis not present

## 2019-11-03 DIAGNOSIS — I69354 Hemiplegia and hemiparesis following cerebral infarction affecting left non-dominant side: Secondary | ICD-10-CM | POA: Diagnosis not present

## 2019-11-09 ENCOUNTER — Telehealth: Payer: Self-pay

## 2019-11-09 NOTE — Telephone Encounter (Signed)
RN returned call to wife whom thought palliative sw was visiting today.  This RN unable to confirm appt for today.  Spoke with wife who states that patient was tired and weak last weekend and she thinks he had a TIA. Continues to be weaker than normal. Wife called PCP office and they advised her to take patient to ER, wife reports that patient refuses. Encouraged wife to take patient to ER if he is not showing signs of improvement. Tentatively scheduled appt with palliative RN for 9/16 at 2 pm virtually or in person depending on need, wife in agreement with plan

## 2019-11-09 NOTE — Telephone Encounter (Signed)
Noted.  plz call and touch base with wife on current symptoms of increasing weakness, falls. Would suggest she also call palliative care team to see if they can come out this month for re evaluation. Is he still getting HHPT?

## 2019-11-09 NOTE — Telephone Encounter (Signed)
Spoke with pt's wife, [name] (on dpr), asking how pt is doing.  States pt seems to be getting weaker.  It's taking longer for him to do daily tasks.  Palliative care social worker is coming to the home today.  PT was supposed to be Mondays and Wednesday.  However, she states no one showed up yesterday.

## 2019-11-09 NOTE — Telephone Encounter (Signed)
I spoke with pts wife; [name] said that pt is not going; pt refuses to go to ED or UC. Pt told wife he just wants to sleep; [name] has errands to take care of and friend of pt is sitting with him until [name]'s return. [name] will talk with pt more about going to be seen at ED or UC. [name] said takes at least 51 ' to get pt on commode and this has been going on for 1 wk. FYI to Dr Darnell Level.

## 2019-11-09 NOTE — Telephone Encounter (Signed)
[facility] - Client TELEPHONE ADVICE RECORD [facility] Patient Name: Jimmy [name] Gender: Male DOB: February 12, 1929 Age: 86 Y 44 M 3 D Return Phone Number: [phone] (Primary) [place] [address] Client [place] [facility] - Client Client Site [place] Physician Ria Bush - MD Contact Type Call Who Is Calling Patient / Member / Family / Caregiver Call Type Triage / Clinical Caller Name [name] Relationship To Patient Spouse Return Phone Number [phone] (Primary) Chief Complaint Weakness, Generalized Reason for Call Symptomatic / Request for [place] states her husband is experiencing falls, lethargy, weakness [place] Not Listed [facility] via car Translation No Nurse Assessment Nurse: Chestine Spore, RN, [place] Date/Time (Eastern Time): 11/09/2019 9:10:39 AM Confirm and document reason for call. If symptomatic, describe symptoms. ---caller states having increased falls in last 3 days. tired. HX CVA Has the patient had close contact with a person known or suspected to have the novel coronavirus illness OR traveled / lives in area with major community spread (including international travel) in the last 14 days from the onset of symptoms? * If Asymptomatic, screen for exposure and travel within the last 14 days. ---No Does the patient have any new or worsening symptoms? ---Yes Will a triage be completed? ---Yes Related visit to physician within the last 2 weeks? ---No Does the PT have any chronic conditions? (i.e. diabetes, asthma, this includes High risk factors for pregnancy, etc.) ---Yes List chronic conditions. ---HX CVA Is this a behavioral health or substance abuse call? ---No Guidelines Guideline Title Affirmed Question Affirmed Notes Nurse Date/Time (Eastern Time) Weakness (Generalized) and Fatigue [1] SEVERE  weakness (i.e., unable to walk or barely able to walk, requires support) Chestine Spore, RN, [place] 11/09/2019 9:15:05 AM PLEASE NOTE: All timestamps contained within this report are represented as [place] Standard Time. CONFIDENTIALTY NOTICE: This fax transmission is intended only for the addressee. It contains information that is legally privileged, confidential or otherwise protected from use or disclosure. If you are not the intended recipient, you are strictly prohibited from reviewing, disclosing, copying using or disseminating any of this information or taking any action in reliance on or regarding this information. If you have received this fax in error, please notify us immediately by telephone so that we can arrange for its return to [place]. Phone: [phone], Toll-Free: [phone], Fax: [phone] Page: 2 of 2 Call Id: [id number] Guidelines Guideline Title Affirmed Question Affirmed Notes Nurse Date/Time Eilene Ghazi Time) AND [2] new-onset or worsening Disp. Time Eilene Ghazi Time) Disposition Final User 11/09/2019 9:19:37 AM 911 Outcome Documentation [place], RN, [place] Reason: refusing 911 will go to ER via car 11/09/2019 9:18:00 AM Call EMS 911 Now Yes Chestine Spore, RN, Silvestre Moment Disagree/Comply Disagree Caller Understands Yes PreDisposition Call Doctor Care Advice Given Per Guideline CALL EMS 911 NOW: * Immediate medical attention is needed. You need to hang up and call 911 (or an ambulance). CARE ADVICE given per Weakness and Fatigue (Adult) guideline. Referrals GO TO FACILITY OTHER - SPECIFY

## 2019-11-10 DIAGNOSIS — R296 Repeated falls: Secondary | ICD-10-CM | POA: Diagnosis not present

## 2019-11-10 DIAGNOSIS — F0150 Vascular dementia without behavioral disturbance: Secondary | ICD-10-CM | POA: Diagnosis not present

## 2019-11-10 DIAGNOSIS — I69311 Memory deficit following cerebral infarction: Secondary | ICD-10-CM | POA: Diagnosis not present

## 2019-11-10 DIAGNOSIS — I129 Hypertensive chronic kidney disease with stage 1 through stage 4 chronic kidney disease, or unspecified chronic kidney disease: Secondary | ICD-10-CM | POA: Diagnosis not present

## 2019-11-10 DIAGNOSIS — G309 Alzheimer's disease, unspecified: Secondary | ICD-10-CM | POA: Diagnosis not present

## 2019-11-10 DIAGNOSIS — I69354 Hemiplegia and hemiparesis following cerebral infarction affecting left non-dominant side: Secondary | ICD-10-CM | POA: Diagnosis not present

## 2019-11-12 DIAGNOSIS — F0150 Vascular dementia without behavioral disturbance: Secondary | ICD-10-CM | POA: Diagnosis not present

## 2019-11-12 DIAGNOSIS — I129 Hypertensive chronic kidney disease with stage 1 through stage 4 chronic kidney disease, or unspecified chronic kidney disease: Secondary | ICD-10-CM | POA: Diagnosis not present

## 2019-11-12 DIAGNOSIS — I69311 Memory deficit following cerebral infarction: Secondary | ICD-10-CM | POA: Diagnosis not present

## 2019-11-12 DIAGNOSIS — R296 Repeated falls: Secondary | ICD-10-CM | POA: Diagnosis not present

## 2019-11-12 DIAGNOSIS — G309 Alzheimer's disease, unspecified: Secondary | ICD-10-CM | POA: Diagnosis not present

## 2019-11-12 DIAGNOSIS — I69354 Hemiplegia and hemiparesis following cerebral infarction affecting left non-dominant side: Secondary | ICD-10-CM | POA: Diagnosis not present

## 2019-11-13 DIAGNOSIS — N183 Chronic kidney disease, stage 3 unspecified: Secondary | ICD-10-CM | POA: Diagnosis not present

## 2019-11-13 DIAGNOSIS — I129 Hypertensive chronic kidney disease with stage 1 through stage 4 chronic kidney disease, or unspecified chronic kidney disease: Secondary | ICD-10-CM | POA: Diagnosis not present

## 2019-11-13 DIAGNOSIS — I69311 Memory deficit following cerebral infarction: Secondary | ICD-10-CM | POA: Diagnosis not present

## 2019-11-13 DIAGNOSIS — G309 Alzheimer's disease, unspecified: Secondary | ICD-10-CM | POA: Diagnosis not present

## 2019-11-13 DIAGNOSIS — F0150 Vascular dementia without behavioral disturbance: Secondary | ICD-10-CM | POA: Diagnosis not present

## 2019-11-13 DIAGNOSIS — R296 Repeated falls: Secondary | ICD-10-CM | POA: Diagnosis not present

## 2019-11-13 DIAGNOSIS — I69354 Hemiplegia and hemiparesis following cerebral infarction affecting left non-dominant side: Secondary | ICD-10-CM | POA: Diagnosis not present

## 2019-11-15 ENCOUNTER — Other Ambulatory Visit: Payer: Medicare Other

## 2019-11-15 ENCOUNTER — Other Ambulatory Visit: Payer: Self-pay

## 2019-11-15 VITALS — BP 132/82 | HR 67 | Temp 98.4°F | Resp 16 | Ht 70.0 in | Wt 144.0 lb

## 2019-11-15 DIAGNOSIS — I129 Hypertensive chronic kidney disease with stage 1 through stage 4 chronic kidney disease, or unspecified chronic kidney disease: Secondary | ICD-10-CM | POA: Diagnosis not present

## 2019-11-15 DIAGNOSIS — G309 Alzheimer's disease, unspecified: Secondary | ICD-10-CM | POA: Diagnosis not present

## 2019-11-15 DIAGNOSIS — I69311 Memory deficit following cerebral infarction: Secondary | ICD-10-CM | POA: Diagnosis not present

## 2019-11-15 DIAGNOSIS — Z515 Encounter for palliative care: Secondary | ICD-10-CM

## 2019-11-15 DIAGNOSIS — R296 Repeated falls: Secondary | ICD-10-CM | POA: Diagnosis not present

## 2019-11-15 DIAGNOSIS — I69354 Hemiplegia and hemiparesis following cerebral infarction affecting left non-dominant side: Secondary | ICD-10-CM | POA: Diagnosis not present

## 2019-11-15 DIAGNOSIS — F0150 Vascular dementia without behavioral disturbance: Secondary | ICD-10-CM | POA: Diagnosis not present

## 2019-11-15 NOTE — Progress Notes (Signed)
PATIENT NAME: Jimmy [name] DOB: June 01, 1928 MRN: [id number]  PRIMARY CARE PROVIDER: Ria Bush, MD  RESPONSIBLE PARTY:  Acct ID - Guarantor Home Phone Work Phone Relationship Acct Type  [id number] - Carmelina [phone]  Self P/F     [address], [place], [place] [address]    PLAN OF CARE and INTERVENTIONS:               1.  GOALS OF CARE/ ADVANCE CARE PLANNING:  Remain at home with wife and be comfortable.               2.  PATIENT/CAREGIVER EDUCATION:  Education on fall precautions, education on s/s of infection, reviewed meds, support               3.  DISEASE STATUS:RN made schedule palliative care home visit. Nurse met with patient and his wife Jimmy [name]. Patient sitting in recliner eating lunch. Patient reports he does have some coughing after eating and drinking. Patient did not cough with eating and drinking during lunch meal today. Patient denies having any pain at the present time. Wife reports patient has slipped off the toilet twice in the past 2 months. Wife states she does not call [facility] to get patient up as they want patient to go to the hospital and patient does not want to go to the hospital. Wife states she called maintenance man who helps her and he was able to get patient off the floor. Wife reports they were scheduled for a [facility] appointment in [place] but did not attend it as [place] representative was out sick. Wife would like social worker to attempt to set up time for VA rep to come to them to see if patient could qualify for aid and attendance services. RN will request [facility] follow up.  Patient remains able to feed himself. Wife reports patient had diarrhea yesterday. Patient reports he has frequent loose stools. Patient continues to take Tylenol as needed if he has pain. Patient reports he typically hurts in rectal area if he does have pain. Patient has no open areas of skin breakdown at the present time. Patient denies having any nausea or vomiting.  Patient reports he sleeps well at night. Wife reports patient was able to walk from den to bathroom and she walk behind him with wheelchair in case he got tired or needed to sit down. Patients vital signs are stable. Patients heart rate remains irregular. Patients breath sounds are diminished throughout. Patient has no edema in his lower extremities. Wife reports she suffered a fall 2 weeks ago and bruised herself on hips and upper legs. Wife said she did not go to the hospital. Nurse reviewed patient's medications with wife. Patient and wife remain in agreement with palliative care services. Patient and wife encouraged to contact palliative care with questions or concerns.     HISTORY OF PRESENT ILLNESS:  Patient is a 86 year old male who resides in home with wife Jimmy [name].  Patient is followed by palliative care and is seen monthly and PRN.    CODE STATUS: DNR  ADVANCED DIRECTIVES: Y Living Will MOST FORM: No PPS: 40%   PHYSICAL EXAM:   VITALS: Today's Vitals   11/15/19 1404  PainSc: 0-No pain    LUNGS: decreased breath sounds CARDIAC: Cor irreg, irreg RRR  EXTREMITIES: none edema SKIN: denies having any open areas of skin breakdown  NEURO: positive for gait problems, memory problems and weakness       Fran Neiswonger  Rolland Porter, RN

## 2019-11-17 ENCOUNTER — Telehealth: Payer: Self-pay

## 2019-11-17 DIAGNOSIS — I129 Hypertensive chronic kidney disease with stage 1 through stage 4 chronic kidney disease, or unspecified chronic kidney disease: Secondary | ICD-10-CM | POA: Diagnosis not present

## 2019-11-17 DIAGNOSIS — I69311 Memory deficit following cerebral infarction: Secondary | ICD-10-CM | POA: Diagnosis not present

## 2019-11-17 DIAGNOSIS — G309 Alzheimer's disease, unspecified: Secondary | ICD-10-CM | POA: Diagnosis not present

## 2019-11-17 DIAGNOSIS — R296 Repeated falls: Secondary | ICD-10-CM | POA: Diagnosis not present

## 2019-11-17 DIAGNOSIS — I69354 Hemiplegia and hemiparesis following cerebral infarction affecting left non-dominant side: Secondary | ICD-10-CM | POA: Diagnosis not present

## 2019-11-17 DIAGNOSIS — F0150 Vascular dementia without behavioral disturbance: Secondary | ICD-10-CM | POA: Diagnosis not present

## 2019-11-17 NOTE — Telephone Encounter (Signed)
SW returned call to patient's wife-Jimmy [name] regarding as she had questions about the [place]. Because patient is homebound, other accommodations need to be made to complete his [place] paperwork. Also the person he is scheduled to meet with his out of the office for 2 months. SW advised her to call the [place] office Jimmy [name]) to request a different representative who could help them now, also update that representative on patient's situation- that he is homebound, and request other accomodation for him to complete the process such as sending a VA representative to the home, virtual visit via zoom or could Jimmy [name] come with necessary paperwork to complete the process without patient. Jimmy [name] verbalized appreciation for the suggestions and stated that she would follow-up.

## 2019-11-18 DIAGNOSIS — R569 Unspecified convulsions: Secondary | ICD-10-CM | POA: Diagnosis not present

## 2019-11-26 DIAGNOSIS — I69354 Hemiplegia and hemiparesis following cerebral infarction affecting left non-dominant side: Secondary | ICD-10-CM | POA: Diagnosis not present

## 2019-11-26 DIAGNOSIS — I69311 Memory deficit following cerebral infarction: Secondary | ICD-10-CM | POA: Diagnosis not present

## 2019-11-26 DIAGNOSIS — R296 Repeated falls: Secondary | ICD-10-CM | POA: Diagnosis not present

## 2019-11-26 DIAGNOSIS — I129 Hypertensive chronic kidney disease with stage 1 through stage 4 chronic kidney disease, or unspecified chronic kidney disease: Secondary | ICD-10-CM | POA: Diagnosis not present

## 2019-11-26 DIAGNOSIS — F0150 Vascular dementia without behavioral disturbance: Secondary | ICD-10-CM | POA: Diagnosis not present

## 2019-11-26 DIAGNOSIS — G309 Alzheimer's disease, unspecified: Secondary | ICD-10-CM | POA: Diagnosis not present

## 2019-11-29 NOTE — Telephone Encounter (Signed)
HHPT to extend services another 2 wks

## 2019-11-30 DIAGNOSIS — F0150 Vascular dementia without behavioral disturbance: Secondary | ICD-10-CM | POA: Diagnosis not present

## 2019-11-30 DIAGNOSIS — G309 Alzheimer's disease, unspecified: Secondary | ICD-10-CM | POA: Diagnosis not present

## 2019-11-30 DIAGNOSIS — I129 Hypertensive chronic kidney disease with stage 1 through stage 4 chronic kidney disease, or unspecified chronic kidney disease: Secondary | ICD-10-CM | POA: Diagnosis not present

## 2019-11-30 DIAGNOSIS — I69354 Hemiplegia and hemiparesis following cerebral infarction affecting left non-dominant side: Secondary | ICD-10-CM | POA: Diagnosis not present

## 2019-11-30 DIAGNOSIS — R296 Repeated falls: Secondary | ICD-10-CM | POA: Diagnosis not present

## 2019-11-30 DIAGNOSIS — I69311 Memory deficit following cerebral infarction: Secondary | ICD-10-CM | POA: Diagnosis not present

## 2019-12-03 DIAGNOSIS — G309 Alzheimer's disease, unspecified: Secondary | ICD-10-CM | POA: Diagnosis not present

## 2019-12-03 DIAGNOSIS — I129 Hypertensive chronic kidney disease with stage 1 through stage 4 chronic kidney disease, or unspecified chronic kidney disease: Secondary | ICD-10-CM | POA: Diagnosis not present

## 2019-12-03 DIAGNOSIS — I69354 Hemiplegia and hemiparesis following cerebral infarction affecting left non-dominant side: Secondary | ICD-10-CM | POA: Diagnosis not present

## 2019-12-03 DIAGNOSIS — F0150 Vascular dementia without behavioral disturbance: Secondary | ICD-10-CM | POA: Diagnosis not present

## 2019-12-03 DIAGNOSIS — R296 Repeated falls: Secondary | ICD-10-CM | POA: Diagnosis not present

## 2019-12-03 DIAGNOSIS — I69311 Memory deficit following cerebral infarction: Secondary | ICD-10-CM | POA: Diagnosis not present

## 2019-12-04 ENCOUNTER — Emergency Department (HOSPITAL_COMMUNITY)
Admission: EM | Admit: 2019-12-04 | Discharge: 2019-12-05 | Disposition: A | Discharge status: Home / Self Care | Payer: Medicare Other | Attending: Emergency Medicine | Admitting: Emergency Medicine

## 2019-12-04 ENCOUNTER — Encounter (HOSPITAL_COMMUNITY): Payer: Self-pay | Admitting: Emergency Medicine

## 2019-12-04 ENCOUNTER — Emergency Department (HOSPITAL_COMMUNITY): Payer: Medicare Other

## 2019-12-04 ENCOUNTER — Other Ambulatory Visit: Payer: Self-pay

## 2019-12-04 DIAGNOSIS — I6381 Other cerebral infarction due to occlusion or stenosis of small artery: Secondary | ICD-10-CM | POA: Diagnosis not present

## 2019-12-04 DIAGNOSIS — E785 Hyperlipidemia, unspecified: Secondary | ICD-10-CM | POA: Insufficient documentation

## 2019-12-04 DIAGNOSIS — M6281 Muscle weakness (generalized): Secondary | ICD-10-CM | POA: Insufficient documentation

## 2019-12-04 DIAGNOSIS — R05 Cough: Secondary | ICD-10-CM | POA: Insufficient documentation

## 2019-12-04 DIAGNOSIS — T68XXXA Hypothermia, initial encounter: Secondary | ICD-10-CM | POA: Diagnosis not present

## 2019-12-04 DIAGNOSIS — R4781 Slurred speech: Secondary | ICD-10-CM | POA: Insufficient documentation

## 2019-12-04 DIAGNOSIS — R531 Weakness: Secondary | ICD-10-CM | POA: Diagnosis not present

## 2019-12-04 DIAGNOSIS — C4490 Unspecified malignant neoplasm of skin, unspecified: Secondary | ICD-10-CM | POA: Insufficient documentation

## 2019-12-04 DIAGNOSIS — Z20822 Contact with and (suspected) exposure to covid-19: Secondary | ICD-10-CM | POA: Insufficient documentation

## 2019-12-04 DIAGNOSIS — I10 Essential (primary) hypertension: Secondary | ICD-10-CM | POA: Diagnosis not present

## 2019-12-04 DIAGNOSIS — Z79899 Other long term (current) drug therapy: Secondary | ICD-10-CM | POA: Insufficient documentation

## 2019-12-04 DIAGNOSIS — Z7982 Long term (current) use of aspirin: Secondary | ICD-10-CM | POA: Insufficient documentation

## 2019-12-04 DIAGNOSIS — I130 Hypertensive heart and chronic kidney disease with heart failure and stage 1 through stage 4 chronic kidney disease, or unspecified chronic kidney disease: Secondary | ICD-10-CM | POA: Insufficient documentation

## 2019-12-04 DIAGNOSIS — G9389 Other specified disorders of brain: Secondary | ICD-10-CM | POA: Diagnosis not present

## 2019-12-04 DIAGNOSIS — Z87891 Personal history of nicotine dependence: Secondary | ICD-10-CM | POA: Diagnosis not present

## 2019-12-04 DIAGNOSIS — I509 Heart failure, unspecified: Secondary | ICD-10-CM | POA: Diagnosis not present

## 2019-12-04 DIAGNOSIS — N183 Chronic kidney disease, stage 3 unspecified: Secondary | ICD-10-CM | POA: Insufficient documentation

## 2019-12-04 DIAGNOSIS — R5382 Chronic fatigue, unspecified: Secondary | ICD-10-CM | POA: Insufficient documentation

## 2019-12-04 DIAGNOSIS — R29810 Facial weakness: Secondary | ICD-10-CM | POA: Diagnosis not present

## 2019-12-04 LAB — COMPREHENSIVE METABOLIC PANEL
ALT: 21 U/L (ref 0–44)
AST: 22 U/L (ref 15–41)
Albumin: 3.9 g/dL (ref 3.5–5.0)
Alkaline Phosphatase: 108 U/L (ref 38–126)
Anion gap: 9 (ref 5–15)
BUN: 25 mg/dL — ABNORMAL HIGH (ref 8–23)
CO2: 25 mmol/L (ref 22–32)
Calcium: 9.9 mg/dL (ref 8.9–10.3)
Chloride: 106 mmol/L (ref 98–111)
Creatinine, Ser: 1.13 mg/dL (ref 0.61–1.24)
GFR calc Af Amer: >60 mL/min (ref >60)
GFR calc non Af Amer: 58 mL/min — ABNORMAL LOW (ref >60)
Glucose, Bld: 112 mg/dL — ABNORMAL HIGH (ref 70–99)
Potassium: 4.4 mmol/L (ref 3.5–5.1)
Sodium: 140 mmol/L (ref 135–145)
Total Bilirubin: 1.1 mg/dL (ref 0.3–1.2)
Total Protein: 6.7 g/dL (ref 6.5–8.1)

## 2019-12-04 LAB — CBC WITH DIFFERENTIAL/PLATELET
Abs Immature Granulocytes: 0.03 10*3/uL (ref 0.00–0.07)
Basophils Absolute: 0 10*3/uL (ref 0.0–0.1)
Basophils Relative: 0 %
Eosinophils Absolute: 0.1 10*3/uL (ref 0.0–0.5)
Eosinophils Relative: 1 %
HCT: 44.7 % (ref 39.0–52.0)
Hemoglobin: 14.4 g/dL (ref 13.0–17.0)
Immature Granulocytes: 1 %
Lymphocytes Relative: 15 %
Lymphs Abs: 1 10*3/uL (ref 0.7–4.0)
MCH: 30.3 pg (ref 26.0–34.0)
MCHC: 32.2 g/dL (ref 30.0–36.0)
MCV: 93.9 fL (ref 80.0–100.0)
Monocytes Absolute: 0.5 10*3/uL (ref 0.1–1.0)
Monocytes Relative: 7 %
Neutro Abs: 5 10*3/uL (ref 1.7–7.7)
Neutrophils Relative %: 76 %
Platelets: 161 10*3/uL (ref 150–400)
RBC: 4.76 MIL/uL (ref 4.22–5.81)
RDW: 13.3 % (ref 11.5–15.5)
WBC: 6.6 10*3/uL (ref 4.0–10.5)
nRBC: 0 % (ref 0.0–0.2)

## 2019-12-04 LAB — ETHANOL: Alcohol, Ethyl (B): <10 mg/dL (ref <10)

## 2019-12-04 LAB — SARS CORONAVIRUS 2 BY RT PCR (HOSPITAL ORDER, PERFORMED IN ~~LOC~~ HOSPITAL LAB): SARS Coronavirus 2: NEGATIVE

## 2019-12-04 LAB — PROTIME-INR
INR: 1 (ref 0.8–1.2)
Prothrombin Time: 13 seconds (ref 11.4–15.2)

## 2019-12-04 MED ORDER — SODIUM CHLORIDE 0.9 % IV BOLUS
1000.0000 mL | Freq: Once | INTRAVENOUS | Status: AC
  Administered 2019-12-04: 1000 mL via INTRAVENOUS

## 2019-12-04 NOTE — ED Provider Notes (Signed)
Medical screening examination/treatment/procedure(s) were conducted as a shared visit with non-physician practitioner(s) and myself.  I personally evaluated the patient during the encounter. Briefly, the patient is a 86 y.o. male with history of CAD, reflux, high cholesterol, hypertension, stroke who presents to the ED with generalized weakness for the last several days to weeks.  Unremarkable vitals.  No fever.  Appears weaker today.  Had an event where he was too weak to get up off of the commode.  Did not lose consciousness.  Appears dry on exam.  Has not had much of an appetite.  Does not appear to have any focal neurological findings on exam.  Patient does have home health aide.  Does have wheelchair and other assistive devices at home.  Will give IV fluids and check basic labs, urinalysis, Covid test, chest x-ray, CT head.  EKG shows sinus rhythm.  He is not having any pain or discomfort.  Overall suspect chronic deconditioning but will look for any acute causes of further weakness today.  Anticipate discharge if lab work unremarkable.  This chart was dictated using voice recognition software.  Despite best efforts to proofread,  errors can occur which can change the documentation meaning.     EKG Interpretation  Date/Time:  Saturday December 04 2019 20:55:27 EDT Ventricular Rate:  79 PR Interval:    QRS Duration: 83 QT Interval:  371 QTC Calculation: 426 R Axis:   47 Text Interpretation: Sinus rhythm Consider right atrial enlargement Consider anterior infarct Artifact in lead(s) I III aVR aVL aVF Confirmed by Lennice Sites [phone]) on 12/04/2019 9:12:51 PM          Lennice Sites, DO 12/04/19 2126

## 2019-12-04 NOTE — ED Notes (Signed)
Called CT, pt is ready for them

## 2019-12-04 NOTE — Discharge Instructions (Signed)
Your lab work up was reassuring.  Your Chest XR did not show any signs of pneumonia.  Your COVID test was negative.  Follow up with your primary care doctor.

## 2019-12-04 NOTE — ED Provider Notes (Signed)
[facility] EMERGENCY DEPARTMENT Provider Note   CSN: [id number] Arrival date & time: 12/04/19  2025     History Chief Complaint  Patient presents with   Weakness    Jimmy [name] is a 86 y.o. male with PMH/o basal cell carcinoma, CAD, carotid stenosis, chronic kidney disease, CVA with left-sided deficits who presents for evaluation of slurred speech, generalized weakness.  Wife is at bedside who provides most of the history.  She reports that about 6 PM, patient went to the bathroom.  She states she came back on and checked him in about 15 minutes later and patient was weak and was having difficulty getting up from the toilet.  They called the neighbor over to help remove him from the toilet and was placed on the bed.  Wife states that then she started noticing that he was having "gibberish speech."  She states that his speech was slurred and he was not making sense.  No tonic-clonic shaking during this time.  No vomiting.  He never lost consciousness.  She states she called EMS.  She states that when EMS got there, it started getting better.  She estimates that this lasted a total of 45 minutes.  He reports that patient has been weak and more fatigued over the last day or so.  She states that he has had a slight cough that is nonproductive.  No fevers.  No vomiting.  The history is provided by the patient.       Past Medical History:  Diagnosis Date   Actinic keratosis    Basal cell carcinoma 02/15/2016   Right lateral neck infraauricular of SCC scar   Basal cell carcinoma 02/15/2016   Left chin   Basal cell carcinoma 03/08/2014   Right of midline forehead   CAD (coronary artery disease)    a. 04/2001 S/P CABGx 4;  b. 2008 MV:  EF 63% normal perfusion.   Carotid stenosis    a. 11/2012 Carotid U/S:  RICA 83-15%, LICA 17-61%.   Chronic kidney disease, stage 3    CLL (chronic lymphoblastic leukemia) 2009   Stage IV; Dr. Ivor Messier - referred to Dr. Lissa Merlin  [facility] 07/2013 now on Rituxan (10/2013) --> stage 0 (08/735)   Diastolic CHF ([place])    a. 04/624 Echo: EF 55-65%, mild conc LVH, Gr 1 DD, mild AS, triv AI, mildly dil Ao root (3.5 cm).   GERD (gastroesophageal reflux disease) 1990s   History of CVA (cerebrovascular accident) 2015   by MRI - remote L internal capsule   History of herpes genitalis    valtrex daily   Hyperlipemia 2002   Hypertension 2002   Mass of submandibular region 2015   referred to gen surg after chemo   Mild aortic stenosis    a. 07/2011 Echo: Mild AS, triv AI.   Sebaceous carcinoma 05/17/2019   Right anterior nasal ala   Skin cancer    Squamous cell carcinoma of skin 10/05/2014   Right mid posterior ear   Stroke ([place])    Thrombocytopenia (HCC)    outpatient goals Hgb >9, plt >20   TIA (transient ischemic attack)    04/2016    Patient Active Problem List   Diagnosis Date Noted   Unresponsive episode 08/25/2019   Palliative care patient 07/03/2019   Visual hallucinations 07/03/2019   Fecal impaction ([place]) 06/23/2019   DNR (do not resuscitate) 05/28/2019   Chronic constipation with overflow incontinence 02/02/2019   Slurred speech 08/27/2018  Chronic diastolic CHF (congestive heart failure) ([place]) 04/30/2018   Left spastic hemiparesis ([place]) 04/30/2018   Hypertension with goal blood pressure less than 120/80    Dysphagia, post-stroke    History of hemorrhagic stroke with residual hemiparesis ([place]) 04/04/2018   Mixed Alzheimer's and vascular dementia ([place]) 06/17/2017   TIA (transient ischemic attack) 04/08/2016   Thrombocytopenia ([place]) 04/08/2016   General weakness 11/11/2014   Advanced care planning/counseling discussion 09/16/2014   Nocturia 09/16/2014   Mass of submandibular region    History of stroke with residual deficit    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   Chest pressure 11/19/2013   S/P CABG (coronary artery bypass graft) 11/19/2013   Chronic  kidney disease, stage 3    Aortic stenosis    Medicare annual wellness visit, subsequent 08/27/2012   Vitamin D deficiency 08/17/2012   Dyspnea 06/26/2011   Erectile dysfunction 09/18/2010   Carotid artery stenosis 12/01/2009   UNSPECIFIED SUBJECTIVE VISUAL DISTURBANCE 05/01/2009   BACK PAIN, LUMBAR 10/24/2008   Chronic lymphocytic leukemia, Rai stage 0 ([place]) 04/02/2007    Class: Chronic   DERMATITIS, SEBORRHEIC NOS 12/16/2006   GENITAL HERPES 12/04/2006   Mixed hyperlipidemia 12/04/2006   Essential hypertension 12/04/2006   Coronary atherosclerosis 12/04/2006   GERD 12/04/2006   Prediabetes 12/04/2006   RENAL CALCULUS, HX OF 12/04/2006    Past Surgical History:  Procedure Laterality Date   ANGIOPLASTY  1998   CATARACT EXTRACTION, BILATERAL     R 1/09, L 8/09   COLONOSCOPY  11/29/1987   Normal   COLONOSCOPY  02/07/2003   Hemm. Internal, focal proctitis, negative biopsy   COLONOSCOPY  12/12/2004   Internal external hemorrhoids, +proctits, negative biopsy   CORONARY ANGIOPLASTY  1998   CORONARY ARTERY BYPASS GRAFT  04/23/2001   x4, Dr. Pia Mau   ESOPHAGOGASTRODUODENOSCOPY  11/29/1987   Gastritis   ETT  12/10/2006   Persantine myoview nml   HEMORROIDECTOMY  1954   Fissure repair, [place]   HEPATIC ARTERY ANGIOPLASTY  1954   [place]   KIDNEY STONE SURGERY  1977   Dr Redmond Baseman   LITHOTRIPSY  1990s   Multiple   US ECHOCARDIOGRAPHY  07/2011   nl sys fxn, EF 55-60%, grade 1 diast dysfunction, mild AS, mildly elevated PA pressure       Family History  Problem Relation Age of Onset   Hypertension Mother    Heart disease Mother    Hyperlipidemia Mother    Hypertension Father    Hyperlipidemia Father    Kidney cancer Sister        Renal cell cancer   Alcohol abuse Brother    Diabetes Brother    Stroke Brother    Heart attack Brother        MI   Diabetes Other        Sister's daughter   Depression Daughter         Bipolar    Social History   Tobacco Use   Smoking status: Former Smoker    Packs/day: 2.00    Years: 30.00    Pack years: 60.00    Quit date: 02/28/1979    Years since quitting: 40.7   Smokeless tobacco: Never Used   Tobacco comment: quit 1980  Vaping Use   Vaping Use: Never used  Substance Use Topics   Alcohol use: No    Alcohol/week: 0.0 standard drinks    Comment: occasional wine   Drug use: Never    Home Medications Prior to  Admission medications   Medication Sig Start Date End Date Taking? Authorizing Provider  acetaminophen (TYLENOL) 650 MG CR tablet Take 650-1,300 mg by mouth See admin instructions. Take one tablet (650 mg) by mouth in the morning and two tablets (1300 mg) at night - as needed for pain/headache   Yes [provider]  allopurinol (ZYLOPRIM) 300 MG tablet TAKE 1 TABLET DAILY Patient taking differently: Take 300 mg by mouth at bedtime.  02/24/19  Yes Ria Bush, MD  aspirin EC 325 MG EC tablet Take 1 tablet (325 mg total) by mouth daily. 08/29/18  Yes Salary, Avel Peace, MD  diphenhydrAMINE (BENADRYL ALLERGY) 25 MG tablet Take 1 tablet (25 mg total) by mouth at bedtime as needed for sleep. Patient taking differently: Take 50 mg by mouth at bedtime.  09/10/18  Yes Ria Bush, MD  DM-Phenylephrine-Acetaminophen [facility] DAYQUIL COLD & FLU PO) Take 15 mLs by mouth daily as needed (cough).   Yes [provider]  donepezil (ARICEPT) 5 MG tablet Take 5 mg by mouth daily. 07/29/19  Yes [provider]  gentamicin cream (GARAMYCIN) 0.1 % Apply 1 application topically 2 (two) times daily. Patient taking differently: Apply 1 application topically See admin instructions. Apply topically to left big toe after showering until healed 09/01/19  Yes Edrick Kins, DPM  lisinopril (ZESTRIL) 5 MG tablet TAKE 1 TABLET DAILY (NEW DOSE) Patient taking differently: Take 5 mg by mouth daily.  12/21/18  Yes Ria Bush, MD  nitroGLYCERIN  (NITROSTAT) 0.4 MG SL tablet Place 1 tablet (0.4 mg total) under the tongue every 5 (five) minutes as needed for chest pain (max 3 doses in 15 minutes). 09/02/18  Yes Ria Bush, MD  simvastatin (ZOCOR) 20 MG tablet Take 1 tablet (20 mg total) by mouth at bedtime. 10/07/19  Yes Ria Bush, MD  valACYclovir (VALTREX) 500 MG tablet TAKE 1 TABLET DAILY Patient taking differently: Take 500 mg by mouth daily.  07/12/19  Yes Ria Bush, MD  vitamin B-12 (CYANOCOBALAMIN) 1000 MCG tablet Take 1 tablet (1,000 mcg total) by mouth daily. 06/17/17  Yes Ria Bush, MD  VITAMIN D PO Take 1 tablet by mouth daily.    Yes [provider]  acetaminophen (TYLENOL) 325 MG tablet Take 2 tablets (650 mg total) by mouth every 4 (four) hours as needed for mild pain (or temp > 37.5 C (99.5 F)). Patient not taking: Reported on 12/04/2019 04/29/18   Angiulli, Lavon Paganini, PA-C    Allergies    Collodion, New skin, and Tape  Review of Systems   Review of Systems  Constitutional: Positive for fatigue. Negative for fever.  Respiratory: Positive for cough. Negative for shortness of breath.   Cardiovascular: Negative for chest pain.  Gastrointestinal: Negative for abdominal pain, nausea and vomiting.  Genitourinary: Negative for dysuria and hematuria.  Neurological: Positive for speech difficulty and weakness (generalized). Negative for headaches.  All other systems reviewed and are negative.   Physical Exam Updated Vital Signs BP (!) 184/82 (BP Location: Right Arm)    Pulse 67    Temp 97.8 F (36.6 C) (Oral)    Resp 18    Ht 5\' 10"  (1.778 m)    Wt 65.3 kg    SpO2 99%    BMI 20.66 kg/m   Physical Exam Vitals and nursing note reviewed.  Constitutional:      Appearance: Normal appearance. He is well-developed.  HENT:     Head: Normocephalic and atraumatic.  Eyes:  General: Lids are normal.     Conjunctiva/sclera: Conjunctivae normal.     Pupils: Pupils are equal, round, and reactive  to light.     Comments: PERRL. EOMs intact. No nystagmus. No neglect.   Cardiovascular:     Rate and Rhythm: Normal rate and regular rhythm.     Pulses: Normal pulses.     Heart sounds: Normal heart sounds. No murmur heard.  No friction rub. No gallop.   Pulmonary:     Effort: Pulmonary effort is normal.     Breath sounds: Normal breath sounds.     Comments: Lungs clear to auscultation bilaterally.  Symmetric chest rise.  No wheezing, rales, rhonchi. Abdominal:     Palpations: Abdomen is soft. Abdomen is not rigid.     Tenderness: There is no abdominal tenderness. There is no guarding.     Comments: Abdomen is soft, non-distended, non-tender. No rigidity, No guarding. No peritoneal signs.  Musculoskeletal:        General: Normal range of motion.     Cervical back: Full passive range of motion without pain.  Skin:    General: Skin is warm and dry.     Capillary Refill: Capillary refill takes less than 2 seconds.  Neurological:     Mental Status: He is alert and oriented to person, place, and time.     Comments: Cranial nerves III-XII intact Follows commands, Moves all extremities  5/5 strength to RUE and RLE, 4/5 strength noted to LUE and LLE which is baseline Sensation intact throughout all major nerve distributions Normal finger to nose. No dysdiadochokinesia. Slight pronator drift on the left.  No slurred speech. No facial droop.   Psychiatric:        Speech: Speech normal.     ED Results / Procedures / Treatments   Labs (all labs ordered are listed, but only abnormal results are displayed) Labs Reviewed  COMPREHENSIVE METABOLIC PANEL - Abnormal; Notable for the following components:      Result Value   Glucose, Bld 112 (*)    BUN 25 (*)    GFR calc non Af Amer 58 (*)    All other components within normal limits  URINALYSIS, ROUTINE W REFLEX MICROSCOPIC - Abnormal; Notable for the following components:   Ketones, ur 5 (*)    All other components within normal limits    SARS CORONAVIRUS 2 BY RT PCR (HOSPITAL ORDER, [place] [facility])  CBC WITH DIFFERENTIAL/PLATELET  ETHANOL  RAPID URINE DRUG SCREEN, HOSP PERFORMED  PROTIME-INR    EKG EKG Interpretation  Date/Time:  Saturday December 04 2019 20:55:27 EDT Ventricular Rate:  79 PR Interval:    QRS Duration: 83 QT Interval:  371 QTC Calculation: 426 R Axis:   47 Text Interpretation: Sinus rhythm Consider right atrial enlargement Consider anterior infarct Artifact in lead(s) I III aVR aVL aVF Confirmed by Lennice Sites [phone]) on 12/04/2019 9:12:51 PM   Radiology CT Head Wo Contrast  Result Date: 12/04/2019 CLINICAL DATA:  Transient ischemic attack, remote cerebral infarct EXAM: CT HEAD WITHOUT CONTRAST TECHNIQUE: Contiguous axial images were obtained from the base of the skull through the vertex without intravenous contrast. COMPARISON:  None. FINDINGS: Brain: Mild parenchymal volume loss is commensurate with the patient's age. Extensive, confluent bilateral periventricular and subcortical white matter changes are present likely reflecting the sequela of small vessel ischemia. Multiple remote lacunar infarcts are noted within the basal ganglia bilaterally and thalami bilaterally. Remote infarcts noted within the pons bilaterally.  No abnormal intra or extra-axial mass lesion or fluid collection. No abnormal mass effect or midline shift. No evidence of acute intracranial hemorrhage or infarct. Ventricular size is normal. Cerebellum unremarkable. Vascular: Unremarkable Skull: Intact Sinuses/Orbits: Paranasal sinuses are clear. Orbits are unremarkable. Other: Mastoid air cells and middle ear cavities are clear. IMPRESSION: 1. No acute intracranial abnormality. 2. Extensive bilateral periventricular and subcortical white matter changes likely reflecting the sequela of small vessel ischemia. 3. Multiple remote lacunar infarcts as above. Electronically Signed   By: Fidela Salisbury MD   On: 12/04/2019  22:23   DG Chest Portable 1 View  Result Date: 12/04/2019 CLINICAL DATA:  Cough EXAM: PORTABLE CHEST 1 VIEW COMPARISON:  04/04/2018 FINDINGS: Prior CABG. Heart and mediastinal contours are within normal limits. No focal opacities or effusions. No acute bony abnormality. IMPRESSION: No active disease. Electronically Signed   By: Rolm Baptise M.D.   On: 12/04/2019 21:51    Procedures Procedures (including critical care time)  Medications Ordered in ED Medications  sodium chloride 0.9 % bolus 1,000 mL (0 mLs Intravenous Stopped 12/05/19 0259)  sodium chloride 0.9 % bolus 500 mL (0 mLs Intravenous Stopped 12/05/19 0304)    ED Course  I have reviewed the triage vital signs and the nursing notes.  Pertinent labs & imaging results that were available during my care of the patient were reviewed by me and considered in my medical decision making (see chart for details).    MDM Rules/Calculators/A&P                          86 y.o.  M with PMH/o CVA, TIAs (Currently on ASA because cannot tolerate Plavix) who presents for evaluation of generalized weakness and slurred speech that occurred this evening. Patient was using the restroom and then was too weak to get up. Additoinally wife noted some "gibberish talk" that lasted about 45 minutes. It started to clear up once EMS arrived. She reports it is better now. Patient has been generalized weak and fatigued over the last week.  He has also had a slight cough.  No fevers.  He is currently on aspirin because he cannot tolerate Plavix.  Caryl Pina arrival, he is afebrile nontoxic-appearing.  Vital signs are stable.  On exam, he is able to answer my questions appropriately and is alert and oriented x3.  No evidence of new deficits.  He has history of baseline left-sided deficits secondary to history of CVAs.  He does have a home health aide at home as well as wheelchair and other assistive eyes.  We will plan to check labs, chest x-ray, CT head for evaluation of any  acute abnormality that would be contributing to his symptoms.  At this time, his symptoms are resolved.  Wife states his speech is back to baseline. Suspect had an overall vagal episode while on the commode.  Doubt acute CVA given resolution of his symptoms.  Review of records.  Patient with history of multiple strokes.  Not able to tolerate Plavix and is only on ASA   CBC without any significant cytosis or anemia.  CMP shows BUN of 25, creatinine 1.13.  INR is 1.0.  Ethanol is unremarkable.  Chest x-ray negative for any acute infectious etiology.  CT head negative for any acute abnormality.  Reevaluation.  Patient still at baseline per wife.  She does report he is more quiet than normal.  Discussed with him that at this time given his resolution  of symptoms, my suspicion for stroke is low.  Suspect his symptoms are most likely related to vagal episode while on the toilet.  Patient is not able to tolerate Plavix and is on aspirin.  I discussed this with wife.  At this time, given my low suspicion for acute CVA do not feel that MRI is indicated in this instance.  Wife is in agreement.  Patient signed out to Joline Maxcy, PA-C with urine pending.   Portions of this note were generated with [employer]obbyist. Dictation errors may occur despite best attempts at proofreading.   Final Clinical Impression(s) / ED Diagnoses Final diagnoses:  Generalized weakness    Rx / DC Orders ED Discharge Orders    None       Volanda Napoleon, PA-C 12/05/19 [place], [place], DO 12/05/19 1857

## 2019-12-04 NOTE — ED Provider Notes (Signed)
86 year old male received at sign out from [place] pending UA. Per her HPI:   "Jimmy [name] is a 86 y.o. male with PMH/o basal cell carcinoma, CAD, carotid stenosis, chronic kidney disease, CVA with left-sided deficits who presents for evaluation of slurred speech, generalized weakness.  Wife is at bedside who provides most of the history.  She reports that about 6 PM, patient went to the bathroom.  She states she came back on and checked him in about 15 minutes later and patient was weak and was having difficulty getting up from the toilet.  They called the neighbor over to help remove him from the toilet and was placed on the bed.  Wife states that then she started noticing that he was having "gibberish speech."  She states that his speech was slurred and he was not making sense.  No tonic-clonic shaking during this time.  No vomiting.  He never lost consciousness.  She states she called EMS.  She states that when EMS got there, it started getting better.  She estimates that this lasted a total of 45 minutes.  He reports that patient has been weak and more fatigued over the last day or so.  She states that he has had a slight cough that is nonproductive.  No fevers.  No vomiting.  The history is provided by the patient."   Physical Exam  BP (!) 197/74   Pulse 68   Temp 98.5 F (36.9 C) (Rectal)   Resp 17   Ht 5\' 10"  (1.778 m)   Wt 65.3 kg   SpO2 98%   BMI 20.66 kg/m   Physical Exam Constitutional:      Appearance: He is well-developed. He is not ill-appearing or toxic-appearing.     Comments: Pleasant, thin elderly male.  No acute distress.  HENT:     Head: Normocephalic and atraumatic.     Mouth/Throat:     Mouth: Mucous membranes are dry.  Cardiovascular:     Rate and Rhythm: Normal rate.  Pulmonary:     Effort: Pulmonary effort is normal.  Abdominal:     Tenderness: There is no abdominal tenderness.  Musculoskeletal:     Cervical back: Neck supple.  Neurological:      Mental Status: He is alert.     Cranial Nerves: No cranial nerve deficit.  Psychiatric:        Mood and Affect: Mood normal.     ED Course/Procedures     Procedures  MDM   86 year old male received a signout from [place] pending urinalysis.  Please see her note for further work-up and medical decision making.  In brief, this is a gentleman with previous strokes who is brought in for slurred speech, left-sided deficits and generalized weakness that resolved in route.  Patient is on maximum dose of aspirin and is unable to tolerate Plavix.  Urinalysis with minimal ketonuria, but no evidence of infection.  Of note, patient did not void after receiving 1 L of IV fluids.  He was bladder scanned with only 300 cc in the urinary bladder, concerning for dehydration.  He received another 500 cc and has now voided twice since he was in the ER.  Patient's wife is at bedside.  He was ambulated with a walker by staff.  Staff noted that patient seemed very unsteady on his feet.  However, patient's wife reports that this is the patient's current baseline.  She has home health and a private aide in place and  if the patient is able to be returned home by PTR with assistance to the home she feels safe taking him home at this time.  ER return precautions given.  He is hemodynamically stable and in no acute distress.  Safe for discharge home with outpatient follow-up as indicated.    Joline Maxcy A, PA-C 12/05/19 [id number]    Ripley Fraise, MD 12/05/19 [phone]

## 2019-12-04 NOTE — ED Triage Notes (Signed)
Per [facility], pt from home w/ [name] (wife) and caregiver at the time.  He is able to get around the house w/ a lot of assistance however he felt much weaker than normal when he was using the toliet and was unable to get off.  Wife stated his speech was "not clear" but has resolved.  Hx of past stroke X2 w/ left sided deficits.  Both wife and caregiver agree that he was at baseline when EMS arrived.    While in route w/ EMS, pt became very tearful stating he wanted [place] to "Please ignore the DNR.... [name] is not my wife, she cut me loose and is my power of attorney...she wants my DNR not me."  Pt is oriented X4.  Provider aware.   CBG 119 97% on RA HR 80 RR 14 179/87  20G Left forearm

## 2019-12-05 DIAGNOSIS — M6281 Muscle weakness (generalized): Secondary | ICD-10-CM | POA: Diagnosis not present

## 2019-12-05 DIAGNOSIS — M2550 Pain in unspecified joint: Secondary | ICD-10-CM | POA: Diagnosis not present

## 2019-12-05 DIAGNOSIS — R531 Weakness: Secondary | ICD-10-CM | POA: Diagnosis not present

## 2019-12-05 DIAGNOSIS — Z7401 Bed confinement status: Secondary | ICD-10-CM | POA: Diagnosis not present

## 2019-12-05 DIAGNOSIS — I10 Essential (primary) hypertension: Secondary | ICD-10-CM | POA: Diagnosis not present

## 2019-12-05 LAB — RAPID URINE DRUG SCREEN, HOSP PERFORMED
Amphetamines: NOT DETECTED
Barbiturates: NOT DETECTED
Benzodiazepines: NOT DETECTED
Cocaine: NOT DETECTED
Opiates: NOT DETECTED
Tetrahydrocannabinol: NOT DETECTED

## 2019-12-05 LAB — URINALYSIS, ROUTINE W REFLEX MICROSCOPIC
Bilirubin Urine: NEGATIVE
Glucose, UA: NEGATIVE mg/dL
Hgb urine dipstick: NEGATIVE
Ketones, ur: 5 mg/dL — AB
Leukocytes,Ua: NEGATIVE
Nitrite: NEGATIVE
Protein, ur: NEGATIVE mg/dL
Specific Gravity, Urine: 1.014 (ref 1.005–1.030)
pH: 6 (ref 5.0–8.0)

## 2019-12-05 MED ORDER — SODIUM CHLORIDE 0.9 % IV BOLUS
500.0000 mL | Freq: Once | INTRAVENOUS | Status: AC
  Administered 2019-12-05: 500 mL via INTRAVENOUS

## 2019-12-05 NOTE — ED Notes (Signed)
Pt has urinated in the bed, RN and tec changed linens and gown for pt.

## 2019-12-05 NOTE — ED Notes (Signed)
Ptar called 

## 2019-12-05 NOTE — ED Notes (Signed)
Pt's daughter called and w/ pt's permission RN gave update

## 2019-12-05 NOTE — ED Notes (Signed)
Pt was very weak and needed two person assistance to get him back in the bed however wife reports he is at baseline.  Provider aware.

## 2019-12-07 ENCOUNTER — Telehealth: Payer: Self-pay

## 2019-12-07 NOTE — Telephone Encounter (Signed)
Pt went to ER Sat. PM and came home Sun. AM. BP was up in the ER. He has been very weak, lethargic, not talking much. Would not take his medication this morning. Would not eat or drink much yesterday. Had a BM last night. She is asking what she needs to do. [phone].

## 2019-12-08 NOTE — Telephone Encounter (Signed)
Recent ER note reviewed. Overall reassuring eval.  Recently saw neuro for tele health visit as well, concern for new seizure but pt/wife declined AED, aspirin changed to 325mg  daily.   HEAD CT IMPRESSION: 1. No acute intracranial abnormality. 2. Extensive bilateral periventricular and subcortical white matter changes likely reflecting the sequela of small vessel ischemia. 3. Multiple remote lacunar infarcts as above.  Called wife, left message. Will try again later.

## 2019-12-08 NOTE — Telephone Encounter (Signed)
Jimmy [name] returned your call Best number [phone]

## 2019-12-09 ENCOUNTER — Other Ambulatory Visit: Payer: Self-pay

## 2019-12-09 ENCOUNTER — Encounter: Payer: Self-pay | Admitting: Emergency Medicine

## 2019-12-09 ENCOUNTER — Emergency Department: Payer: Medicare Other

## 2019-12-09 ENCOUNTER — Inpatient Hospital Stay
Admission: EM | Admit: 2019-12-09 | Discharge: 2019-12-14 | DRG: 884 | Disposition: A | Discharge status: Hospice | Payer: Medicare Other | Attending: Internal Medicine | Admitting: Internal Medicine

## 2019-12-09 DIAGNOSIS — G309 Alzheimer's disease, unspecified: Secondary | ICD-10-CM | POA: Diagnosis present

## 2019-12-09 DIAGNOSIS — C9110 Chronic lymphocytic leukemia of B-cell type not having achieved remission: Secondary | ICD-10-CM | POA: Diagnosis present

## 2019-12-09 DIAGNOSIS — R627 Adult failure to thrive: Secondary | ICD-10-CM | POA: Diagnosis present

## 2019-12-09 DIAGNOSIS — Z87448 Personal history of other diseases of urinary system: Secondary | ICD-10-CM

## 2019-12-09 DIAGNOSIS — R4182 Altered mental status, unspecified: Secondary | ICD-10-CM

## 2019-12-09 DIAGNOSIS — G9349 Other encephalopathy: Secondary | ICD-10-CM | POA: Diagnosis not present

## 2019-12-09 DIAGNOSIS — I739 Peripheral vascular disease, unspecified: Secondary | ICD-10-CM | POA: Diagnosis not present

## 2019-12-09 DIAGNOSIS — I69311 Memory deficit following cerebral infarction: Secondary | ICD-10-CM | POA: Diagnosis not present

## 2019-12-09 DIAGNOSIS — I129 Hypertensive chronic kidney disease with stage 1 through stage 4 chronic kidney disease, or unspecified chronic kidney disease: Secondary | ICD-10-CM | POA: Diagnosis not present

## 2019-12-09 DIAGNOSIS — I2510 Atherosclerotic heart disease of native coronary artery without angina pectoris: Secondary | ICD-10-CM | POA: Diagnosis not present

## 2019-12-09 DIAGNOSIS — Z66 Do not resuscitate: Secondary | ICD-10-CM | POA: Diagnosis not present

## 2019-12-09 DIAGNOSIS — D631 Anemia in chronic kidney disease: Secondary | ICD-10-CM | POA: Diagnosis present

## 2019-12-09 DIAGNOSIS — I10 Essential (primary) hypertension: Secondary | ICD-10-CM | POA: Diagnosis not present

## 2019-12-09 DIAGNOSIS — E785 Hyperlipidemia, unspecified: Secondary | ICD-10-CM | POA: Diagnosis present

## 2019-12-09 DIAGNOSIS — I69354 Hemiplegia and hemiparesis following cerebral infarction affecting left non-dominant side: Secondary | ICD-10-CM | POA: Diagnosis not present

## 2019-12-09 DIAGNOSIS — D696 Thrombocytopenia, unspecified: Secondary | ICD-10-CM | POA: Diagnosis present

## 2019-12-09 DIAGNOSIS — I6523 Occlusion and stenosis of bilateral carotid arteries: Secondary | ICD-10-CM | POA: Diagnosis present

## 2019-12-09 DIAGNOSIS — Z6822 Body mass index (BMI) 22.0-22.9, adult: Secondary | ICD-10-CM

## 2019-12-09 DIAGNOSIS — R296 Repeated falls: Secondary | ICD-10-CM | POA: Diagnosis not present

## 2019-12-09 DIAGNOSIS — M79602 Pain in left arm: Secondary | ICD-10-CM | POA: Diagnosis present

## 2019-12-09 DIAGNOSIS — K219 Gastro-esophageal reflux disease without esophagitis: Secondary | ICD-10-CM | POA: Diagnosis present

## 2019-12-09 DIAGNOSIS — Z83438 Family history of other disorder of lipoprotein metabolism and other lipidemia: Secondary | ICD-10-CM

## 2019-12-09 DIAGNOSIS — Z9861 Coronary angioplasty status: Secondary | ICD-10-CM

## 2019-12-09 DIAGNOSIS — I110 Hypertensive heart disease with heart failure: Secondary | ICD-10-CM | POA: Diagnosis present

## 2019-12-09 DIAGNOSIS — G319 Degenerative disease of nervous system, unspecified: Secondary | ICD-10-CM | POA: Diagnosis not present

## 2019-12-09 DIAGNOSIS — Z8249 Family history of ischemic heart disease and other diseases of the circulatory system: Secondary | ICD-10-CM

## 2019-12-09 DIAGNOSIS — I959 Hypotension, unspecified: Secondary | ICD-10-CM | POA: Diagnosis not present

## 2019-12-09 DIAGNOSIS — I248 Other forms of acute ischemic heart disease: Secondary | ICD-10-CM | POA: Diagnosis present

## 2019-12-09 DIAGNOSIS — R634 Abnormal weight loss: Secondary | ICD-10-CM | POA: Diagnosis present

## 2019-12-09 DIAGNOSIS — Z833 Family history of diabetes mellitus: Secondary | ICD-10-CM

## 2019-12-09 DIAGNOSIS — I5032 Chronic diastolic (congestive) heart failure: Secondary | ICD-10-CM | POA: Diagnosis present

## 2019-12-09 DIAGNOSIS — I69322 Dysarthria following cerebral infarction: Secondary | ICD-10-CM

## 2019-12-09 DIAGNOSIS — R531 Weakness: Secondary | ICD-10-CM | POA: Diagnosis present

## 2019-12-09 DIAGNOSIS — Z85828 Personal history of other malignant neoplasm of skin: Secondary | ICD-10-CM

## 2019-12-09 DIAGNOSIS — F0280 Dementia in other diseases classified elsewhere without behavioral disturbance: Secondary | ICD-10-CM | POA: Diagnosis present

## 2019-12-09 DIAGNOSIS — I6381 Other cerebral infarction due to occlusion or stenosis of small artery: Secondary | ICD-10-CM | POA: Diagnosis not present

## 2019-12-09 DIAGNOSIS — I350 Nonrheumatic aortic (valve) stenosis: Secondary | ICD-10-CM | POA: Diagnosis present

## 2019-12-09 DIAGNOSIS — R778 Other specified abnormalities of plasma proteins: Secondary | ICD-10-CM

## 2019-12-09 DIAGNOSIS — Z951 Presence of aortocoronary bypass graft: Secondary | ICD-10-CM

## 2019-12-09 DIAGNOSIS — R0902 Hypoxemia: Secondary | ICD-10-CM | POA: Diagnosis not present

## 2019-12-09 DIAGNOSIS — F0150 Vascular dementia without behavioral disturbance: Principal | ICD-10-CM | POA: Diagnosis present

## 2019-12-09 DIAGNOSIS — Z79899 Other long term (current) drug therapy: Secondary | ICD-10-CM

## 2019-12-09 DIAGNOSIS — M109 Gout, unspecified: Secondary | ICD-10-CM | POA: Diagnosis present

## 2019-12-09 DIAGNOSIS — I69391 Dysphagia following cerebral infarction: Secondary | ICD-10-CM

## 2019-12-09 DIAGNOSIS — G459 Transient cerebral ischemic attack, unspecified: Secondary | ICD-10-CM | POA: Diagnosis present

## 2019-12-09 DIAGNOSIS — E876 Hypokalemia: Secondary | ICD-10-CM | POA: Diagnosis present

## 2019-12-09 DIAGNOSIS — Z87891 Personal history of nicotine dependence: Secondary | ICD-10-CM

## 2019-12-09 DIAGNOSIS — Z20822 Contact with and (suspected) exposure to covid-19: Secondary | ICD-10-CM | POA: Diagnosis present

## 2019-12-09 DIAGNOSIS — N179 Acute kidney failure, unspecified: Secondary | ICD-10-CM | POA: Diagnosis present

## 2019-12-09 DIAGNOSIS — Z7982 Long term (current) use of aspirin: Secondary | ICD-10-CM

## 2019-12-09 DIAGNOSIS — I25118 Atherosclerotic heart disease of native coronary artery with other forms of angina pectoris: Secondary | ICD-10-CM | POA: Diagnosis present

## 2019-12-09 DIAGNOSIS — H9319 Tinnitus, unspecified ear: Secondary | ICD-10-CM | POA: Diagnosis present

## 2019-12-09 DIAGNOSIS — Z87442 Personal history of urinary calculi: Secondary | ICD-10-CM

## 2019-12-09 DIAGNOSIS — G9340 Encephalopathy, unspecified: Secondary | ICD-10-CM | POA: Diagnosis present

## 2019-12-09 DIAGNOSIS — R1310 Dysphagia, unspecified: Secondary | ICD-10-CM | POA: Diagnosis present

## 2019-12-09 DIAGNOSIS — R269 Unspecified abnormalities of gait and mobility: Secondary | ICD-10-CM | POA: Diagnosis present

## 2019-12-09 DIAGNOSIS — H748X1 Other specified disorders of right middle ear and mastoid: Secondary | ICD-10-CM | POA: Diagnosis not present

## 2019-12-09 DIAGNOSIS — I69318 Other symptoms and signs involving cognitive functions following cerebral infarction: Secondary | ICD-10-CM

## 2019-12-09 DIAGNOSIS — R4781 Slurred speech: Secondary | ICD-10-CM | POA: Diagnosis present

## 2019-12-09 DIAGNOSIS — Z823 Family history of stroke: Secondary | ICD-10-CM

## 2019-12-09 DIAGNOSIS — Z8051 Family history of malignant neoplasm of kidney: Secondary | ICD-10-CM

## 2019-12-09 LAB — CBC WITH DIFFERENTIAL/PLATELET
Abs Immature Granulocytes: 0.05 10*3/uL (ref 0.00–0.07)
Basophils Absolute: 0.1 10*3/uL (ref 0.0–0.1)
Basophils Relative: 1 %
Eosinophils Absolute: 0.1 10*3/uL (ref 0.0–0.5)
Eosinophils Relative: 1 %
HCT: 44.6 % (ref 39.0–52.0)
Hemoglobin: 15.3 g/dL (ref 13.0–17.0)
Immature Granulocytes: 1 %
Lymphocytes Relative: 14 %
Lymphs Abs: 1.5 10*3/uL (ref 0.7–4.0)
MCH: 31.5 pg (ref 26.0–34.0)
MCHC: 34.3 g/dL (ref 30.0–36.0)
MCV: 91.8 fL (ref 80.0–100.0)
Monocytes Absolute: 1 10*3/uL (ref 0.1–1.0)
Monocytes Relative: 9 %
Neutro Abs: 8.1 10*3/uL — ABNORMAL HIGH (ref 1.7–7.7)
Neutrophils Relative %: 74 %
Platelets: 211 10*3/uL (ref 150–400)
RBC: 4.86 MIL/uL (ref 4.22–5.81)
RDW: 13.5 % (ref 11.5–15.5)
WBC: 10.8 10*3/uL — ABNORMAL HIGH (ref 4.0–10.5)
nRBC: 0 % (ref 0.0–0.2)

## 2019-12-09 LAB — COMPREHENSIVE METABOLIC PANEL
ALT: 27 U/L (ref 0–44)
AST: 28 U/L (ref 15–41)
Albumin: 4.3 g/dL (ref 3.5–5.0)
Alkaline Phosphatase: 110 U/L (ref 38–126)
Anion gap: 12 (ref 5–15)
BUN: 39 mg/dL — ABNORMAL HIGH (ref 8–23)
CO2: 28 mmol/L (ref 22–32)
Calcium: 10.1 mg/dL (ref 8.9–10.3)
Chloride: 100 mmol/L (ref 98–111)
Creatinine, Ser: 1.37 mg/dL — ABNORMAL HIGH (ref 0.61–1.24)
GFR calc Af Amer: 53 mL/min — ABNORMAL LOW (ref >60)
GFR calc non Af Amer: 46 mL/min — ABNORMAL LOW (ref >60)
Glucose, Bld: 145 mg/dL — ABNORMAL HIGH (ref 70–99)
Potassium: 3.8 mmol/L (ref 3.5–5.1)
Sodium: 140 mmol/L (ref 135–145)
Total Bilirubin: 1.2 mg/dL (ref 0.3–1.2)
Total Protein: 7.5 g/dL (ref 6.5–8.1)

## 2019-12-09 LAB — TROPONIN I (HIGH SENSITIVITY): Troponin I (High Sensitivity): 445 ng/L (ref <18)

## 2019-12-09 MED ORDER — ACETAMINOPHEN 325 MG PO TABS: 650.0000 mg | ORAL_TABLET | ORAL | Status: DC | PRN

## 2019-12-09 MED ORDER — ACETAMINOPHEN 160 MG/5ML PO SOLN
650.0000 mg | ORAL | Status: DC | PRN
  Filled 2019-12-09: qty 20.3

## 2019-12-09 MED ORDER — ENOXAPARIN SODIUM 40 MG/0.4ML ~~LOC~~ SOLN
40.0000 mg | SUBCUTANEOUS | Status: DC
  Administered 2019-12-10 – 2019-12-14 (×5): 40 mg via SUBCUTANEOUS
  Filled 2019-12-09 (×5): qty 0.4

## 2019-12-09 MED ORDER — ASPIRIN EC 325 MG PO TBEC
325.0000 mg | DELAYED_RELEASE_TABLET | Freq: Every day | ORAL | Status: DC
  Administered 2019-12-10 – 2019-12-14 (×5): 325 mg via ORAL
  Filled 2019-12-09 (×5): qty 1

## 2019-12-09 MED ORDER — ACETAMINOPHEN 650 MG RE SUPP: 650.0000 mg | RECTAL | Status: DC | PRN

## 2019-12-09 MED ORDER — STROKE: EARLY STAGES OF RECOVERY BOOK: Freq: Once | Status: DC

## 2019-12-09 MED ORDER — SODIUM CHLORIDE 0.9 % IV SOLN: INTRAVENOUS | Status: DC

## 2019-12-09 MED ORDER — SENNOSIDES-DOCUSATE SODIUM 8.6-50 MG PO TABS: 1.0000 | ORAL_TABLET | Freq: Every evening | ORAL | Status: DC | PRN

## 2019-12-09 MED ORDER — ASPIRIN 300 MG RE SUPP
300.0000 mg | Freq: Every day | RECTAL | Status: DC
  Filled 2019-12-09 (×4): qty 1

## 2019-12-09 MED ORDER — NITROGLYCERIN 0.4 MG SL SUBL: 0.4000 mg | SUBLINGUAL_TABLET | SUBLINGUAL | Status: DC | PRN

## 2019-12-09 MED ORDER — MORPHINE SULFATE (PF) 2 MG/ML IV SOLN: 1.0000 mg | INTRAVENOUS | Status: DC | PRN

## 2019-12-09 NOTE — ED Triage Notes (Signed)
Pt in via EMS home with c/o altered mental status. Pt was staring off into space for home health RN. BP 373 systolic. Pt with no complaints. Pt hx of CVA in 2020.

## 2019-12-09 NOTE — Telephone Encounter (Addendum)
Dr Damita Dunnings advised pt should stop lisinopril but also needs to be evaluated at ED. Mark PTA said pts BP went up a little bit 95/65 and pt walked a few feet with walker. Dr Damita Dunnings said pt still needs eval. I spoke with Mrs Meiner and she asked me to call 911 and take pt to [facility] ED; EMS will need to go thru gate and go to ramp at back of house for entrance. Mrs Sangalang said pt is acting like a 86 y.o. advised Ms Selover that EMS is on the way and if any change in pts condition before EMS arrival to call 911. Mrs Dishiman voiced understanding.FYI to DR Damita Dunnings and G. Mrs Servantes added at the end of the call that Adult Protective Services had just left their house and she was not sure who called APS.

## 2019-12-09 NOTE — Telephone Encounter (Signed)
Lottie Rater PTA with [facility] left v/m that pt is more lethargic today; definite decline in function since seen at ED on 12/04/19. Pt is taking lisinopril 5 mg taking one tab daily.. today pts BP 80/50 x 2 Elta Guadeloupe wants to know if pt should stop the lisinopril. Dr Darnell Level is out of office and sending note to DR G as PCP and Dr Damita Dunnings who is in office.

## 2019-12-09 NOTE — ED Triage Notes (Signed)
Pt arrived to ED via EMS from home with c/o altered mental status. Pt was seen by home health nurse who was concnered about pt staring off into space. Pt currently has no complaints. Pt denies abd pain, weakness, chest pain, or shortness of breath. Pt unable to answer questions as a complete thought. Pleasant with no acute distress noted.

## 2019-12-09 NOTE — H&P (Signed)
[place]   PATIENT NAME: Jimmy [name]    MR#:  [id number]  DATE OF BIRTH:  Jan 16, 1929  DATE OF ADMISSION:  12/09/2019  PRIMARY CARE PHYSICIAN: Ria Bush, MD   REQUESTING/REFERRING PHYSICIAN: Nance Pear, MD  CHIEF COMPLAINT:   Chief Complaint  Patient presents with  . Altered Mental Status    HISTORY OF PRESENT ILLNESS:  Jimmy [name]  is a 86 y.o. Caucasian male with a known history of coronary artery disease status post CABG, peripheral vascular disease, diastolic CHF, hypertension, dyslipidemia and gout, who presented to the emergency room with acute onset of altered mental status since Saturday with more confusion, staring and decreased responsiveness without witnessed shaking or seizures.  He denied any headache or dizziness or blurred vision.  He has chronic tinnitus and denies any vertigo.  No paresthesias or focal muscle weakness.  Upon presentation to the emergency room, vital signs were within normal.  Labs revealed a BUN of 39 creatinine 1.37, high-sensitivity troponin I of 445 and later CBC showed no leukocytosis of 10.8.  Noncontrasted head CT scan revealed atrophy and chronic microvascular disease with no acute intracranial normalities.  Brain MRI showed moderately advanced age-related cerebral atrophy with chronic small vessel ischemic disease and multiple remote lacunar infarcts involving both basal ganglia, thalami and pons with no acute intracranial normality. EKG showed normal sinus rhythm with rate of 85 with Q waves anteriorly and inferiorly.  The patient will be admitted to a medical monitored observation bed for further evaluation and management. PAST MEDICAL HISTORY:   Past Medical History:  Diagnosis Date  . Actinic keratosis   . Basal cell carcinoma 02/15/2016   Right lateral neck infraauricular of SCC scar  . Basal cell carcinoma 02/15/2016   Left chin  . Basal cell carcinoma 03/08/2014   Right of midline forehead  . CAD  (coronary artery disease)    a. 04/2001 S/P CABGx 4;  b. 2008 MV:  EF 63% normal perfusion.  . Carotid stenosis    a. 11/2012 Carotid U/S:  RICA 40-98%, LICA 11-91%.  . Chronic kidney disease, stage 3   . CLL (chronic lymphoblastic leukemia) 2009   Stage IV; Dr. Ivor Messier - referred to Dr. Lissa Merlin [facility] 07/2013 now on Rituxan (10/2013) --> stage 0 (09/2014)  . Diastolic CHF ([place])    a. 07/7827 Echo: EF 55-65%, mild conc LVH, Gr 1 DD, mild AS, triv AI, mildly dil Ao root (3.5 cm).  . GERD (gastroesophageal reflux disease) 1990s  . History of CVA (cerebrovascular accident) 2015   by MRI - remote L internal capsule  . History of herpes genitalis    valtrex daily  . Hyperlipemia 2002  . Hypertension 2002  . Mass of submandibular region 2015   referred to gen surg after chemo  . Mild aortic stenosis    a. 07/2011 Echo: Mild AS, triv AI.  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 Skin cancer   . Squamous cell carcinoma of skin 10/05/2014   Right mid posterior ear  . Stroke ([place])   . Thrombocytopenia ([place])    outpatient goals Hgb >9, plt >20  . TIA (transient ischemic attack)    04/2016    PAST SURGICAL HISTORY:   Past Surgical History:  Procedure Laterality Date  . ANGIOPLASTY  1998  . CATARACT EXTRACTION, BILATERAL     R 1/09, L 8/09  . COLONOSCOPY  11/29/1987   Normal  . COLONOSCOPY  02/07/2003  Hemm. Internal, focal proctitis, negative biopsy  . COLONOSCOPY  12/12/2004   Internal external hemorrhoids, +proctits, negative biopsy  . CORONARY ANGIOPLASTY  1998  . CORONARY ARTERY BYPASS GRAFT  04/23/2001   x4, Dr. Pia Mau  . ESOPHAGOGASTRODUODENOSCOPY  11/29/1987   Gastritis  . ETT  12/10/2006   Persantine myoview nml  . HEMORROIDECTOMY  1954   Fissure repair, [place]  . HEPATIC ARTERY ANGIOPLASTY  1954   [place], [place]  . KIDNEY STONE SURGERY  1977   Dr Redmond Baseman  . LITHOTRIPSY  1990s   Multiple  . US ECHOCARDIOGRAPHY  07/2011   nl sys fxn, EF 55-60%,  grade 1 diast dysfunction, mild AS, mildly elevated PA pressure    SOCIAL HISTORY:   Social History   Tobacco Use  . Smoking status: Former Smoker    Packs/day: 2.00    Years: 30.00    Pack years: 60.00    Quit date: 02/28/1979    Years since quitting: 40.8  . Smokeless tobacco: Never Used  . Tobacco comment: quit 1980  Substance Use Topics  . Alcohol use: No    Alcohol/week: 0.0 standard drinks    Comment: occasional wine    FAMILY HISTORY:   Family History  Problem Relation Age of Onset  . Hypertension Mother   . Heart disease Mother   . Hyperlipidemia Mother   . Hypertension Father   . Hyperlipidemia Father   . Kidney cancer Sister        Renal cell cancer  . Alcohol abuse Brother   . Diabetes Brother   . Stroke Brother   . Heart attack Brother        MI  . Diabetes Other        Sister's daughter  . Depression Daughter        Bipolar    DRUG ALLERGIES:   Allergies  Allergen Reactions  . Collodion Other (See Comments)    Unknown reaction  . New Skin Other (See Comments)    Unknown reaction  . Tape Dermatitis    REVIEW OF SYSTEMS:   ROS As per history of present illness. All pertinent systems were reviewed above. Constitutional, HEENT, cardiovascular, respiratory, GI, GU, musculoskeletal, neuro, psychiatric, endocrine, integumentary and hematologic systems were reviewed and are otherwise negative/unremarkable except for positive findings mentioned above in the HPI.   MEDICATIONS AT HOME:   Prior to Admission medications   Medication Sig Start Date End Date Taking? Authorizing Provider  acetaminophen (TYLENOL) 325 MG tablet Take 2 tablets (650 mg total) by mouth every 4 (four) hours as needed for mild pain (or temp > 37.5 C (99.5 F)). Patient not taking: Reported on 12/04/2019 04/29/18   Angiulli, Lavon Paganini, PA-C  acetaminophen (TYLENOL) 650 MG CR tablet Take 650-1,300 mg by mouth See admin instructions. Take one tablet (650 mg) by mouth in the morning  and two tablets (1300 mg) at night - as needed for pain/headache    [provider]  allopurinol (ZYLOPRIM) 300 MG tablet TAKE 1 TABLET DAILY Patient taking differently: Take 300 mg by mouth at bedtime.  02/24/19   Ria Bush, MD  aspirin EC 325 MG EC tablet Take 1 tablet (325 mg total) by mouth daily. 08/29/18   Salary, Avel Peace, MD  diphenhydrAMINE (BENADRYL ALLERGY) 25 MG tablet Take 1 tablet (25 mg total) by mouth at bedtime as needed for sleep. Patient taking differently: Take 50 mg by mouth at bedtime.  09/10/18   Ria Bush, MD  DM-Phenylephrine-Acetaminophen (  VICKS DAYQUIL COLD & FLU PO) Take 15 mLs by mouth daily as needed (cough).    [provider]  donepezil (ARICEPT) 5 MG tablet Take 5 mg by mouth daily. 07/29/19   [provider]  gentamicin cream (GARAMYCIN) 0.1 % Apply 1 application topically 2 (two) times daily. Patient taking differently: Apply 1 application topically See admin instructions. Apply topically to left big toe after showering until healed 09/01/19   Edrick Kins, DPM  lisinopril (ZESTRIL) 5 MG tablet TAKE 1 TABLET DAILY (NEW DOSE) Patient taking differently: Take 5 mg by mouth daily.  12/21/18   Ria Bush, MD  nitroGLYCERIN (NITROSTAT) 0.4 MG SL tablet Place 1 tablet (0.4 mg total) under the tongue every 5 (five) minutes as needed for chest pain (max 3 doses in 15 minutes). 09/02/18   Ria Bush, MD  simvastatin (ZOCOR) 20 MG tablet Take 1 tablet (20 mg total) by mouth at bedtime. 10/07/19   Ria Bush, MD  valACYclovir (VALTREX) 500 MG tablet TAKE 1 TABLET DAILY Patient taking differently: Take 500 mg by mouth daily.  07/12/19   Ria Bush, MD  vitamin B-12 (CYANOCOBALAMIN) 1000 MCG tablet Take 1 tablet (1,000 mcg total) by mouth daily. 06/17/17   Ria Bush, MD  VITAMIN D PO Take 1 tablet by mouth daily.     [provider]      VITAL SIGNS:  Blood pressure 109/65, pulse 90, resp. rate  18, height 5\' 9"  (1.753 m), weight 68 kg, SpO2 96 %.  PHYSICAL EXAMINATION:  Physical Exam  GENERAL:  86 y.o.-year-old Caucasian male patient lying in the bed with no acute distress.  He was fairly anxious and mildly tearful. EYES: Pupils equal, round, reactive to light and accommodation. No scleral icterus. Extraocular muscles intact.  HEENT: Head atraumatic, normocephalic. Oropharynx and nasopharynx clear.  NECK:  Supple, no jugular venous distention. No thyroid enlargement, no tenderness.  LUNGS: Normal breath sounds bilaterally, no wheezing, rales,rhonchi or crepitation. No use of accessory muscles of respiration.  CARDIOVASCULAR: Regular rate and rhythm, S1, S2 normal. No murmurs, rubs, or gallops.  ABDOMEN: Soft, nondistended, nontender. Bowel sounds present. No organomegaly or mass.  EXTREMITIES: No pedal edema, cyanosis, or clubbing.  NEUROLOGIC: Cranial nerves II through XII are intact. Muscle strength 5/5 in all extremities except in the left upper extremity where muscle strength is 3/5. Sensation intact to light touch. Gait not checked.  PSYCHIATRIC: The patient is alert and oriented x 3.  Normal affect and good eye contact. SKIN: No obvious rash, lesion, or ulcer.   LABORATORY PANEL:   CBC Recent Labs  Lab 12/09/19 1959  WBC 10.8*  HGB 15.3  HCT 44.6  PLT 211   ------------------------------------------------------------------------------------------------------------------  Chemistries  Recent Labs  Lab 12/09/19 1959  NA 140  K 3.8  CL 100  CO2 28  GLUCOSE 145*  BUN 39*  CREATININE 1.37*  CALCIUM 10.1  AST 28  ALT 27  ALKPHOS 110  BILITOT 1.2   ------------------------------------------------------------------------------------------------------------------  Cardiac Enzymes No results for input(s): TROPONINI in the last 168  hours. ------------------------------------------------------------------------------------------------------------------  RADIOLOGY:  CT Head Wo Contrast  Result Date: 12/09/2019 CLINICAL DATA:  Mental status changes EXAM: CT HEAD WITHOUT CONTRAST TECHNIQUE: Contiguous axial images were obtained from the base of the skull through the vertex without intravenous contrast. COMPARISON:  12/04/2019 FINDINGS: Brain: There is atrophy and chronic small vessel disease changes. Old bilateral basal ganglia lacunar infarcts. No acute intracranial abnormality. Specifically, no hemorrhage, hydrocephalus, mass lesion, acute infarction,  or significant intracranial injury. Vascular: No hyperdense vessel or unexpected calcification. Skull: No acute calvarial abnormality. Sinuses/Orbits: Visualized paranasal sinuses and mastoids clear. Orbital soft tissues unremarkable. Other: None IMPRESSION: Atrophy, chronic microvascular disease. No acute intracranial abnormality. Electronically Signed   By: Rolm Baptise M.D.   On: 12/09/2019 20:20      IMPRESSION AND PLAN:   1.  Acute encephalopathy/altered mental status. -The patient will be admitted to an observation medically monitored bed. -Differential diagnosis would include TIA and possibly non-ST elevation MI given elevated troponin I.  -We will follow neuro checks every 4 hours to 24 hours. -We will obtain bilateral carotid Doppler and 2D echo for further assessment. -The patient will be continued on aspirin and will add Plavix. -Neurology consultation will be obtained.  2.  Elevated troponin I concerning for possible non-ST elevation MI with history of coronary artery disease status post CABG. -We will follow serial troponin I's. -We will obtain a 2D echo and a cardiology consultation in a.m. -I notified Dr. Lovena Le about the patient. -The patient will be on aspirin and Plavix. -We will continue statin therapy and start small dose beta-blocker therapy.  3.  Mild  acute kidney injury. -We will hold off lisinopril and hydrate the patient with IV normal saline. -BMP will be followed.  4.  Mild dementia. -Aricept to be continued.  5.  Gout. -We will continue allopurinol.  6.  DVT prophylaxis. -Subcutaneous Lovenox.   All the records are reviewed and case discussed with ED provider. The plan of care was discussed in details with the patient (and family). I answered all questions. The patient agreed to proceed with the above mentioned plan. Further management will depend upon hospital course.   CODE STATUS: Full code  Status is: Observation  The patient remains OBS appropriate and will d/c before 2 midnights.  Dispo: The patient is from: Home              Anticipated d/c is to: Home              Anticipated d/c date is: 1 day              Patient currently is not medically stable to d/c.   TOTAL TIME TAKING CARE OF THIS PATIENT: 55 minutes.    Christel Mormon M.D on 12/09/2019 at 11:21 PM  [facility]   From 7 PM-7 AM, contact night-coverage [email or web address]  CC: Primary care physician; Ria Bush, MD   Note: This dictation was prepared with Dragon dictation along with smaller phrase technology. Any transcriptional typo errors that result from this process are unintentional.

## 2019-12-09 NOTE — ED Provider Notes (Signed)
[facility] Emergency Department Provider Note   ____________________________________________   I have reviewed the triage vital signs and the nursing notes.   HISTORY  Chief Complaint Altered Mental Status   History limited by and level 5 caveat due to: Altered Mental Status, history primarily obtained from wife and also caregiver over the phone   HPI Jimmy [name] is a 86 y.o. male who presents to the emergency department today accompanied by wife because of concern for altered mental status. Patient is unable to give any significant history.  Per wife the patient was seen in the emergency department a few days ago after a possible TIA.  Since then he has been more altered and confused than he normally is.  Caregiver states that she also also noticed since that time the patient will have brief episodes where he will stare into space.  He will go somewhat slack daughter and also drool.  She is never noticed the symptoms before.  They have not noticed any new shortness of breath or cough.  They have not noticed any fevers. Caregiver has not noticed that the patient has been in any pain recently, has not been clutching at his chest.    Records reviewed. Per medical record review patient has a history of cad, hld, htn  Past Medical History:  Diagnosis Date  . Actinic keratosis   . Basal cell carcinoma 02/15/2016   Right lateral neck infraauricular of SCC scar  . Basal cell carcinoma 02/15/2016   Left chin  . Basal cell carcinoma 03/08/2014   Right of midline forehead  . CAD (coronary artery disease)    a. 04/2001 S/P CABGx 4;  b. 2008 MV:  EF 63% normal perfusion.  . Carotid stenosis    a. 11/2012 Carotid U/S:  RICA 51-02%, LICA 58-52%.  . Chronic kidney disease, stage 3   . CLL (chronic lymphoblastic leukemia) 2009   Stage IV; Dr. Ivor Messier - referred to Dr. Lissa Merlin [facility] 07/2013 now on Rituxan (10/2013) --> stage 0 (09/2014)  . Diastolic CHF ([place])    a.  07/2011 Echo: EF 55-65%, mild conc LVH, Gr 1 DD, mild AS, triv AI, mildly dil Ao root (3.5 cm).  . GERD (gastroesophageal reflux disease) 1990s  . History of CVA (cerebrovascular accident) 2015   by MRI - remote L internal capsule  . History of herpes genitalis    valtrex daily  . Hyperlipemia 2002  . Hypertension 2002  . Mass of submandibular region 2015   referred to gen surg after chemo  . Mild aortic stenosis    a. 07/2011 Echo: Mild AS, triv AI.  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 Skin cancer   . Squamous cell carcinoma of skin 10/05/2014   Right mid posterior ear  . Stroke ([place])   . Thrombocytopenia ([place])    outpatient goals Hgb >9, plt >20  . TIA (transient ischemic attack)    04/2016    Patient Active Problem List   Diagnosis Date Noted  . Unresponsive episode 08/25/2019  . Palliative care patient 07/03/2019  . Visual hallucinations 07/03/2019  . Fecal impaction ([place]) 06/23/2019  . DNR (do not resuscitate) 05/28/2019  . Chronic constipation with overflow incontinence 02/02/2019  . Slurred speech 08/27/2018  . Chronic diastolic CHF (congestive heart failure) ([place]) 04/30/2018  . Left spastic hemiparesis ([place]) 04/30/2018  . Hypertension with goal blood pressure less than 120/80   . Dysphagia, post-stroke   . History  of hemorrhagic stroke with residual hemiparesis ([place]) 04/04/2018  . Mixed Alzheimer's and vascular dementia ([place]) 06/17/2017  . TIA (transient ischemic attack) 04/08/2016  . Thrombocytopenia ([place]) 04/08/2016  . General weakness 11/11/2014  . Advanced care planning/counseling discussion 09/16/2014  . Nocturia 09/16/2014  . Mass of submandibular region   . History of stroke with residual deficit   . 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 Chest pressure 11/19/2013  . S/P CABG (coronary artery bypass graft) 11/19/2013  . Chronic kidney disease, stage 3   . Aortic stenosis   . Medicare annual wellness visit, subsequent  08/27/2012  . Vitamin D deficiency 08/17/2012  . Dyspnea 06/26/2011  . Erectile dysfunction 09/18/2010  . Carotid artery stenosis 12/01/2009  . UNSPECIFIED SUBJECTIVE VISUAL DISTURBANCE 05/01/2009  . BACK PAIN, LUMBAR 10/24/2008  . Chronic lymphocytic leukemia, Rai stage 0 ([place]) 04/02/2007    Class: Chronic  . DERMATITIS, SEBORRHEIC NOS 12/16/2006  . GENITAL HERPES 12/04/2006  . Mixed hyperlipidemia 12/04/2006  . Essential hypertension 12/04/2006  . Coronary atherosclerosis 12/04/2006  . GERD 12/04/2006  . Prediabetes 12/04/2006  . RENAL CALCULUS, HX OF 12/04/2006    Past Surgical History:  Procedure Laterality Date  . ANGIOPLASTY  1998  . CATARACT EXTRACTION, BILATERAL     R 1/09, L 8/09  . COLONOSCOPY  11/29/1987   Normal  . COLONOSCOPY  02/07/2003   Hemm. Internal, focal proctitis, negative biopsy  . COLONOSCOPY  12/12/2004   Internal external hemorrhoids, +proctits, negative biopsy  . CORONARY ANGIOPLASTY  1998  . CORONARY ARTERY BYPASS GRAFT  04/23/2001   x4, Dr. Pia Mau  . ESOPHAGOGASTRODUODENOSCOPY  11/29/1987   Gastritis  . ETT  12/10/2006   Persantine myoview nml  . HEMORROIDECTOMY  1954   Fissure repair, [place]  . HEPATIC ARTERY ANGIOPLASTY  1954   [place]  . KIDNEY STONE SURGERY  1977   Dr Redmond Baseman  . LITHOTRIPSY  1990s   Multiple  . US ECHOCARDIOGRAPHY  07/2011   nl sys fxn, EF 55-60%, grade 1 diast dysfunction, mild AS, mildly elevated PA pressure    Prior to Admission medications   Medication Sig Start Date End Date Taking? Authorizing Provider  acetaminophen (TYLENOL) 325 MG tablet Take 2 tablets (650 mg total) by mouth every 4 (four) hours as needed for mild pain (or temp > 37.5 C (99.5 F)). Patient not taking: Reported on 12/04/2019 04/29/18   Angiulli, Lavon Paganini, PA-C  acetaminophen (TYLENOL) 650 MG CR tablet Take 650-1,300 mg by mouth See admin instructions. Take one tablet (650 mg) by mouth in the morning and two tablets (1300 mg) at night - as  needed for pain/headache    [provider]  allopurinol (ZYLOPRIM) 300 MG tablet TAKE 1 TABLET DAILY Patient taking differently: Take 300 mg by mouth at bedtime.  02/24/19   Ria Bush, MD  aspirin EC 325 MG EC tablet Take 1 tablet (325 mg total) by mouth daily. 08/29/18   Salary, Avel Peace, MD  diphenhydrAMINE (BENADRYL ALLERGY) 25 MG tablet Take 1 tablet (25 mg total) by mouth at bedtime as needed for sleep. Patient taking differently: Take 50 mg by mouth at bedtime.  09/10/18   Ria Bush, MD  DM-Phenylephrine-Acetaminophen [facility] DAYQUIL COLD & FLU PO) Take 15 mLs by mouth daily as needed (cough).    [provider]  donepezil (ARICEPT) 5 MG tablet Take 5 mg by mouth daily. 07/29/19   [provider]  gentamicin cream (GARAMYCIN) 0.1 % Apply 1 application  topically 2 (two) times daily. Patient taking differently: Apply 1 application topically See admin instructions. Apply topically to left big toe after showering until healed 09/01/19   Edrick Kins, DPM  lisinopril (ZESTRIL) 5 MG tablet TAKE 1 TABLET DAILY (NEW DOSE) Patient taking differently: Take 5 mg by mouth daily.  12/21/18   Ria Bush, MD  nitroGLYCERIN (NITROSTAT) 0.4 MG SL tablet Place 1 tablet (0.4 mg total) under the tongue every 5 (five) minutes as needed for chest pain (max 3 doses in 15 minutes). 09/02/18   Ria Bush, MD  simvastatin (ZOCOR) 20 MG tablet Take 1 tablet (20 mg total) by mouth at bedtime. 10/07/19   Ria Bush, MD  valACYclovir (VALTREX) 500 MG tablet TAKE 1 TABLET DAILY Patient taking differently: Take 500 mg by mouth daily.  07/12/19   Ria Bush, MD  vitamin B-12 (CYANOCOBALAMIN) 1000 MCG tablet Take 1 tablet (1,000 mcg total) by mouth daily. 06/17/17   Ria Bush, MD  VITAMIN D PO Take 1 tablet by mouth daily.     [provider]    Allergies Collodion, New skin, and Tape  Family History  Problem Relation Age of Onset  .  Hypertension Mother   . Heart disease Mother   . Hyperlipidemia Mother   . Hypertension Father   . Hyperlipidemia Father   . Kidney cancer Sister        Renal cell cancer  . Alcohol abuse Brother   . Diabetes Brother   . Stroke Brother   . Heart attack Brother        MI  . Diabetes Other        Sister's daughter  . Depression Daughter        Bipolar    Social History Social History   Tobacco Use  . Smoking status: Former Smoker    Packs/day: 2.00    Years: 30.00    Pack years: 60.00    Quit date: 02/28/1979    Years since quitting: 40.8  . Smokeless tobacco: Never Used  . Tobacco comment: quit 1980  Vaping Use  . Vaping Use: Never used  Substance Use Topics  . Alcohol use: No    Alcohol/week: 0.0 standard drinks    Comment: occasional wine  . Drug use: Never    Review of Systems Unable to obtain secondary to altered mental status.  ____________________________________________   PHYSICAL EXAM:  VITAL SIGNS: ED Triage Vitals [12/09/19 1922]  Enc Vitals Group     BP 109/65     Pulse Rate 90     Resp 18     Temp      Temp src      SpO2 96 %     Weight 150 lb (68 kg)     Height 5\' 9"  (1.753 m)     Head Circumference      Peak Flow      Pain Score 0   Constitutional: Awake and alert.  Eyes: Conjunctivae are normal.  ENT      Head: Normocephalic and atraumatic.      Nose: No congestion/rhinnorhea.      Mouth/Throat: Mucous membranes are moist.      Neck: No stridor. Hematological/Lymphatic/Immunilogical: No cervical lymphadenopathy. Cardiovascular: Normal rate, regular rhythm.  No murmurs, rubs, or gallops Respiratory: Normal respiratory effort without tachypnea nor retractions. Breath sounds are clear and equal bilaterally. No wheezes/rales/rhonchi. Gastrointestinal: Soft and non tender. No rebound. No guarding.  Genitourinary: Deferred Musculoskeletal: Normal range of motion in  all extremities. No lower extremity edema. Neurologic:  Awake and  alert. Minimally verbally responsive.  Skin:  Skin is warm, dry and intact. No rash noted.  ____________________________________________    LABS (pertinent positives/negatives)  Trop hs 445 CBC wbc 10.8, hgb 15.3, plt 211 CMP wnl except glu 145, bun 39, cr 1.37  ____________________________________________   EKG  I, Nance Pear, attending physician, personally viewed and interpreted this EKG  EKG Time: 1949 Rate: 85 Rhythm: normal sinus rhythm Axis: normal Intervals: qtc 440 QRS: narrow, q waves III ST changes: no st elevation Impression: abnormal ekg  ____________________________________________    RADIOLOGY  CT head No acute abnormality  ____________________________________________   PROCEDURES  Procedures  ____________________________________________   INITIAL IMPRESSION / ASSESSMENT AND PLAN / ED COURSE  Pertinent labs & imaging results that were available during my care of the patient were reviewed by me and considered in my medical decision making (see chart for details).   Patient presented to the emergency department today because of concerns for altered mental status and staring spells that family and caregiver have noticed. Work up here is notable for elevated troponin. EKG without acute ST elevation. Patient does have history of strokes and I do have concern that that could be causing the symptoms again. Will order MR head. Acute stroke could potentially explain the troponin elevation as well. I do think ACS unlikely given symptoms described by family and caregiver. Will plan on admission for further work up and evaluation.  ____________________________________________   FINAL CLINICAL IMPRESSION(S) / ED DIAGNOSES  Final diagnoses:  Altered mental status, unspecified altered mental status type  Elevated troponin     Note: This dictation was prepared with Dragon dictation. Any transcriptional errors that result from this process are  unintentional     Nance Pear, MD 12/09/19 2256

## 2019-12-10 ENCOUNTER — Observation Stay: Payer: Medicare Other

## 2019-12-10 ENCOUNTER — Observation Stay (HOSPITAL_COMMUNITY)
Admit: 2019-12-10 | Discharge: 2019-12-10 | Disposition: A | Discharge status: Home / Self Care | Payer: Medicare Other | Attending: Family Medicine | Admitting: Family Medicine

## 2019-12-10 ENCOUNTER — Ambulatory Visit: Payer: Medicare Other

## 2019-12-10 DIAGNOSIS — I25118 Atherosclerotic heart disease of native coronary artery with other forms of angina pectoris: Secondary | ICD-10-CM

## 2019-12-10 DIAGNOSIS — R778 Other specified abnormalities of plasma proteins: Secondary | ICD-10-CM | POA: Diagnosis not present

## 2019-12-10 DIAGNOSIS — C9110 Chronic lymphocytic leukemia of B-cell type not having achieved remission: Secondary | ICD-10-CM | POA: Diagnosis present

## 2019-12-10 DIAGNOSIS — Z66 Do not resuscitate: Secondary | ICD-10-CM | POA: Diagnosis present

## 2019-12-10 DIAGNOSIS — K219 Gastro-esophageal reflux disease without esophagitis: Secondary | ICD-10-CM | POA: Diagnosis present

## 2019-12-10 DIAGNOSIS — F0280 Dementia in other diseases classified elsewhere without behavioral disturbance: Secondary | ICD-10-CM | POA: Diagnosis present

## 2019-12-10 DIAGNOSIS — M109 Gout, unspecified: Secondary | ICD-10-CM | POA: Diagnosis present

## 2019-12-10 DIAGNOSIS — R4182 Altered mental status, unspecified: Secondary | ICD-10-CM

## 2019-12-10 DIAGNOSIS — I160 Hypertensive urgency: Secondary | ICD-10-CM | POA: Diagnosis not present

## 2019-12-10 DIAGNOSIS — F0281 Dementia in other diseases classified elsewhere with behavioral disturbance: Secondary | ICD-10-CM | POA: Diagnosis not present

## 2019-12-10 DIAGNOSIS — G20 Parkinson's disease: Secondary | ICD-10-CM | POA: Diagnosis not present

## 2019-12-10 DIAGNOSIS — I69354 Hemiplegia and hemiparesis following cerebral infarction affecting left non-dominant side: Secondary | ICD-10-CM | POA: Diagnosis not present

## 2019-12-10 DIAGNOSIS — R569 Unspecified convulsions: Secondary | ICD-10-CM | POA: Diagnosis not present

## 2019-12-10 DIAGNOSIS — I110 Hypertensive heart disease with heart failure: Secondary | ICD-10-CM | POA: Diagnosis present

## 2019-12-10 DIAGNOSIS — Z951 Presence of aortocoronary bypass graft: Secondary | ICD-10-CM | POA: Diagnosis not present

## 2019-12-10 DIAGNOSIS — N179 Acute kidney failure, unspecified: Secondary | ICD-10-CM | POA: Diagnosis present

## 2019-12-10 DIAGNOSIS — I739 Peripheral vascular disease, unspecified: Secondary | ICD-10-CM | POA: Diagnosis not present

## 2019-12-10 DIAGNOSIS — I6523 Occlusion and stenosis of bilateral carotid arteries: Secondary | ICD-10-CM | POA: Diagnosis present

## 2019-12-10 DIAGNOSIS — F0150 Vascular dementia without behavioral disturbance: Secondary | ICD-10-CM | POA: Diagnosis present

## 2019-12-10 DIAGNOSIS — R5381 Other malaise: Secondary | ICD-10-CM | POA: Diagnosis not present

## 2019-12-10 DIAGNOSIS — Z20822 Contact with and (suspected) exposure to covid-19: Secondary | ICD-10-CM | POA: Diagnosis present

## 2019-12-10 DIAGNOSIS — I10 Essential (primary) hypertension: Secondary | ICD-10-CM

## 2019-12-10 DIAGNOSIS — F0390 Unspecified dementia without behavioral disturbance: Secondary | ICD-10-CM

## 2019-12-10 DIAGNOSIS — I248 Other forms of acute ischemic heart disease: Secondary | ICD-10-CM | POA: Diagnosis present

## 2019-12-10 DIAGNOSIS — E785 Hyperlipidemia, unspecified: Secondary | ICD-10-CM | POA: Diagnosis present

## 2019-12-10 DIAGNOSIS — G9340 Encephalopathy, unspecified: Secondary | ICD-10-CM | POA: Diagnosis present

## 2019-12-10 DIAGNOSIS — R0989 Other specified symptoms and signs involving the circulatory and respiratory systems: Secondary | ICD-10-CM | POA: Diagnosis not present

## 2019-12-10 DIAGNOSIS — I350 Nonrheumatic aortic (valve) stenosis: Secondary | ICD-10-CM | POA: Diagnosis not present

## 2019-12-10 DIAGNOSIS — H9319 Tinnitus, unspecified ear: Secondary | ICD-10-CM | POA: Diagnosis present

## 2019-12-10 DIAGNOSIS — G309 Alzheimer's disease, unspecified: Secondary | ICD-10-CM | POA: Diagnosis present

## 2019-12-10 DIAGNOSIS — I959 Hypotension, unspecified: Secondary | ICD-10-CM | POA: Diagnosis present

## 2019-12-10 DIAGNOSIS — I6521 Occlusion and stenosis of right carotid artery: Secondary | ICD-10-CM

## 2019-12-10 DIAGNOSIS — R279 Unspecified lack of coordination: Secondary | ICD-10-CM | POA: Diagnosis not present

## 2019-12-10 DIAGNOSIS — G9349 Other encephalopathy: Secondary | ICD-10-CM | POA: Diagnosis present

## 2019-12-10 DIAGNOSIS — M6281 Muscle weakness (generalized): Secondary | ICD-10-CM | POA: Diagnosis not present

## 2019-12-10 DIAGNOSIS — E876 Hypokalemia: Secondary | ICD-10-CM | POA: Diagnosis present

## 2019-12-10 DIAGNOSIS — D696 Thrombocytopenia, unspecified: Secondary | ICD-10-CM | POA: Diagnosis present

## 2019-12-10 DIAGNOSIS — I5032 Chronic diastolic (congestive) heart failure: Secondary | ICD-10-CM | POA: Diagnosis present

## 2019-12-10 DIAGNOSIS — R627 Adult failure to thrive: Secondary | ICD-10-CM | POA: Diagnosis present

## 2019-12-10 DIAGNOSIS — I69322 Dysarthria following cerebral infarction: Secondary | ICD-10-CM | POA: Diagnosis not present

## 2019-12-10 LAB — CBC
HCT: 36.1 % — ABNORMAL LOW (ref 39.0–52.0)
Hemoglobin: 13.2 g/dL (ref 13.0–17.0)
MCH: 32 pg (ref 26.0–34.0)
MCHC: 36.6 g/dL — ABNORMAL HIGH (ref 30.0–36.0)
MCV: 87.4 fL (ref 80.0–100.0)
Platelets: 149 10*3/uL — ABNORMAL LOW (ref 150–400)
RBC: 4.13 MIL/uL — ABNORMAL LOW (ref 4.22–5.81)
RDW: 13.2 % (ref 11.5–15.5)
WBC: 6.1 10*3/uL (ref 4.0–10.5)
nRBC: 0 % (ref 0.0–0.2)

## 2019-12-10 LAB — URINALYSIS, COMPLETE (UACMP) WITH MICROSCOPIC
Bacteria, UA: NONE SEEN
Bilirubin Urine: NEGATIVE
Glucose, UA: NEGATIVE mg/dL
Hgb urine dipstick: NEGATIVE
Ketones, ur: 5 mg/dL — AB
Leukocytes,Ua: NEGATIVE
Nitrite: NEGATIVE
Protein, ur: 30 mg/dL — AB
Specific Gravity, Urine: 1.028 (ref 1.005–1.030)
Squamous Epithelial / HPF: NONE SEEN (ref 0–5)
pH: 5 (ref 5.0–8.0)

## 2019-12-10 LAB — LIPID PANEL
Cholesterol: 106 mg/dL (ref 0–200)
HDL: 35 mg/dL — ABNORMAL LOW (ref >40)
LDL Cholesterol: 48 mg/dL (ref 0–99)
Total CHOL/HDL Ratio: 3 RATIO
Triglycerides: 114 mg/dL (ref <150)
VLDL: 23 mg/dL (ref 0–40)

## 2019-12-10 LAB — HEMOGLOBIN A1C
Hgb A1c MFr Bld: 5.4 % (ref 4.8–5.6)
Mean Plasma Glucose: 108.28 mg/dL

## 2019-12-10 LAB — MAGNESIUM: Magnesium: 2.1 mg/dL (ref 1.7–2.4)

## 2019-12-10 LAB — ECHOCARDIOGRAM COMPLETE
AR max vel: 1.57 cm2
AV Area VTI: 1.48 cm2
AV Area mean vel: 1.55 cm2
AV Mean grad: 7 mmHg
AV Peak grad: 14.3 mmHg
Ao pk vel: 1.89 m/s
Area-P 1/2: 4.06 cm2
Height: 69 in
S' Lateral: 2.09 cm
Weight: 2400 oz

## 2019-12-10 LAB — BASIC METABOLIC PANEL
Anion gap: 12 (ref 5–15)
BUN: 37 mg/dL — ABNORMAL HIGH (ref 8–23)
CO2: 24 mmol/L (ref 22–32)
Calcium: 9.2 mg/dL (ref 8.9–10.3)
Chloride: 104 mmol/L (ref 98–111)
Creatinine, Ser: 1.03 mg/dL (ref 0.61–1.24)
GFR calc Af Amer: >60 mL/min (ref >60)
GFR calc non Af Amer: >60 mL/min (ref >60)
Glucose, Bld: 141 mg/dL — ABNORMAL HIGH (ref 70–99)
Potassium: 3.4 mmol/L — ABNORMAL LOW (ref 3.5–5.1)
Sodium: 140 mmol/L (ref 135–145)

## 2019-12-10 LAB — TROPONIN I (HIGH SENSITIVITY)
Troponin I (High Sensitivity): 356 ng/L (ref <18)
Troponin I (High Sensitivity): 344 ng/L (ref <18)

## 2019-12-10 LAB — SARS CORONAVIRUS 2 BY RT PCR (HOSPITAL ORDER, PERFORMED IN ~~LOC~~ HOSPITAL LAB): SARS Coronavirus 2: NEGATIVE

## 2019-12-10 MED ORDER — DONEPEZIL HCL 5 MG PO TABS
5.0000 mg | ORAL_TABLET | Freq: Every day | ORAL | Status: DC
  Administered 2019-12-10 – 2019-12-13 (×4): 5 mg via ORAL
  Filled 2019-12-10 (×4): qty 1

## 2019-12-10 MED ORDER — ALLOPURINOL 300 MG PO TABS
300.0000 mg | ORAL_TABLET | Freq: Every day | ORAL | Status: DC
  Administered 2019-12-10 – 2019-12-13 (×4): 300 mg via ORAL
  Filled 2019-12-10 (×6): qty 1

## 2019-12-10 MED ORDER — SIMVASTATIN 20 MG PO TABS
20.0000 mg | ORAL_TABLET | Freq: Every day | ORAL | Status: DC
  Administered 2019-12-10 – 2019-12-13 (×4): 20 mg via ORAL
  Filled 2019-12-10 (×4): qty 1

## 2019-12-10 MED ORDER — CARVEDILOL 3.125 MG PO TABS
3.1250 mg | ORAL_TABLET | Freq: Two times a day (BID) | ORAL | Status: DC
  Administered 2019-12-10 – 2019-12-13 (×5): 3.125 mg via ORAL
  Filled 2019-12-10 (×6): qty 1

## 2019-12-10 NOTE — ED Notes (Signed)
patietn ate a small amount chicken,  Mac and cheese and broccoli.  He said he did not want any more.  Did drink some tea.  He talks to me and makes eye contact, but doesn't make any sense.

## 2019-12-10 NOTE — Progress Notes (Signed)
OT Cancellation Note  Patient Details Name: Jimmy [name] MRN: [id number] DOB: Apr 03, 1928   Cancelled Treatment:    Reason Eval/Treat Not Completed: Other (comment). Consult received, chart reviewed. Pt pending 2D echo, cardiology consult, and currently trending troponins (so far 445, 356) for possible NSTEMI. Will hold OT evaluation at this time and re-attempt at later time pending consult and testing completed and plan of care updated.   Jeni Salles, MPH, MS, OTR/L ascom [phone] 12/10/19, 8:12 AM

## 2019-12-10 NOTE — Consult Note (Signed)
NEURO HOSPITALIST CONSULT NOTE   Requestig physician: Dr. Jimmye Norman  Reason for Consult: AMS  History obtained from:   Chart     HPI:                                                                                                                                          Jimmy [name] is an 86 y.o. male with a PMHx of mild dementia, CAD, bilateral carotid stenosis, CKD3, CLL, diastolic CHF, past stroke X 2 w/ left sided deficits, HLD, HTN, history of herpes genitalis, thrombocytopenia and mild aortic stenosis who presented to the ED yesterday with increasing lethargy and hypotension.   Of note, at the time of Neurology consultation, no family is in the room. History is obtained from chart review.   He had recently been seen at the [facility] ED on 9/4 for increasing diffuse weakness over the last several days to weeks, with one episode during which he was so weak he could not get up off the commode; he also had "gibberish speech" that made no sense and was slurred, which lasted for about 45 minutes before resolving spontaneously; his BP in th eED was 197/74 at that time. CT head showed no acute abnormality on a background of multiple remote lacunar infarctions and extensive chronic small vessel ischemic changes. He was given IVF and ambulated in the ED; was noted to be somewhat unsteady but wife and patient elected to be sent home when wife reported that he was back to his baseline. After discharge home, the patient continued to be weak and lethargic, with decreased PO intake, not talking much and at one point refusing to take medication.   At some point, per his PCP note, he had seen [facility] due to concern for new seizure, but his wife declined AED.   Today, BP at home was 80/50 x 2 per home health aide, who had called the patient's doctor's office to see if the patient should stop Lisinopril. PCPs office recommended stopping lisinopril and to be evaluated in the ED. The  patient's wife also noted at that time that the patient was "acting like a 86 year old". Home health RN also noted the patient to be staring off into space. EMS was called for AMS and on their assessment, the patient had no complaints, with a SBP of 155. On arrival to the ED, the patient denied having any symptoms, including no abdominal pain, weakness, SOB or CP. However, he was noted to be unable to answer questions with a full, coherent train of thought. He was also noted to be unable to provide any significant history.   Per EDP note today: "Per wife the patient was seen in the emergency department a few days ago after a possible TIA.  Since then he has been more altered and confused than he normally is.  Caregiver states that she also also noticed since that time the patient will have brief episodes where he will stare into space.  He will go somewhat slack daughter and also drool.  She is never noticed the symptoms before.  They have not noticed any new shortness of breath or cough.  They have not noticed any fevers. Caregiver has not noticed that the patient has been in any pain recently, has not been clutching at his chest."  Hospitalist felt that the DDx included TIA. Initial plan was for frequent neuro checks, carotid ultrasound, TTE and to continue ASA. Troponins were elevated and patient is also being assessed for possible NSTEMI. Cardiology feels that his elevated troponins are most likely due to demand ischemia in the setting of labile blood pressure.   MRI brain has been obtained, revealing no acute intracranial findings, but with multiple remote lacunar infarctions of the bilateral basal ganglia and pons, as well as extensive chronic small vessel ischemic changes and diffuse cerebral atrophy.   Cardiology consultant obtained additional history from the patient's wife regarding his cognitive impairment, as follows: "She reports that she has required a sitter 1 hour in the morning 1 hour in the  evening to help get him up getting ready in the morning, and get him down to bed Sitter also, is 4 hours on Saturdays so she can get out Reason for the sitter is worsening cognitive issues She is aware of dementia, reports that she has Lewy body in her family and she is very concerned about his symptoms Symptoms have been worse in the past 2 months, particularly worse in the past week In the past week will go off into a stair, then has to sleep for several hours before he is functional Reports he has good appetite, physical activity has been poor due to weakness. He wears diapers every night to bed, 3 pads underneath him, will not use a bedside commode Wife has to help him clean up sometimes in the middle of the night She takes care of him essentially from 10:00 in the morning to 7:00 and overnight She has high degree of exhaustion."  Of note, he is scheduled to see Dr. Brigitte Pulse of Neurology in 1 week's time.   Past Medical History:  Diagnosis Date  . Actinic keratosis   . Basal cell carcinoma 02/15/2016   Right lateral neck infraauricular of SCC scar  . Basal cell carcinoma 02/15/2016   Left chin  . Basal cell carcinoma 03/08/2014   Right of midline forehead  . CAD (coronary artery disease)    a. 04/2001 S/P CABGx 4;  b. 2008 MV:  EF 63% normal perfusion.  . Carotid stenosis    a. 11/2012 Carotid U/S:  RICA 35-57%, LICA 32-20%.  . Chronic kidney disease, stage 3   . CLL (chronic lymphoblastic leukemia) 2009   Stage IV; Dr. Ivor Messier - referred to Dr. Lissa Merlin [facility] 07/2013 now on Rituxan (10/2013) --> stage 0 (09/2014)  . Diastolic CHF ([place])    a. 05/5425 Echo: EF 55-65%, mild conc LVH, Gr 1 DD, mild AS, triv AI, mildly dil Ao root (3.5 cm).  . GERD (gastroesophageal reflux disease) 1990s  . History of CVA (cerebrovascular accident) 2015   by MRI - remote L internal capsule  . History of herpes genitalis    valtrex daily  . Hyperlipemia 2002  . Hypertension 2002  . Mass of submandibular region  2015   referred to gen surg after chemo  . Mild aortic stenosis    a. 07/2011 Echo: Mild AS, triv AI.  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 Skin cancer   . Squamous cell carcinoma of skin 10/05/2014   Right mid posterior ear  . Stroke ([place])   . Thrombocytopenia ([place])    outpatient goals Hgb >9, plt >20  . TIA (transient ischemic attack)    04/2016    Past Surgical History:  Procedure Laterality Date  . ANGIOPLASTY  1998  . CATARACT EXTRACTION, BILATERAL     R 1/09, L 8/09  . COLONOSCOPY  11/29/1987   Normal  . COLONOSCOPY  02/07/2003   Hemm. Internal, focal proctitis, negative biopsy  . COLONOSCOPY  12/12/2004   Internal external hemorrhoids, +proctits, negative biopsy  . CORONARY ANGIOPLASTY  1998  . CORONARY ARTERY BYPASS GRAFT  04/23/2001   x4, Dr. Pia Mau  . ESOPHAGOGASTRODUODENOSCOPY  11/29/1987   Gastritis  . ETT  12/10/2006   Persantine myoview nml  . HEMORROIDECTOMY  1954   Fissure repair, [place]  . HEPATIC ARTERY ANGIOPLASTY  1954   [place]  . KIDNEY STONE SURGERY  1977   Dr Redmond Baseman  . LITHOTRIPSY  1990s   Multiple  . US ECHOCARDIOGRAPHY  07/2011   nl sys fxn, EF 55-60%, grade 1 diast dysfunction, mild AS, mildly elevated PA pressure    Family History  Problem Relation Age of Onset  . Hypertension Mother   . Heart disease Mother   . Hyperlipidemia Mother   . Hypertension Father   . Hyperlipidemia Father   . Kidney cancer Sister        Renal cell cancer  . Alcohol abuse Brother   . Diabetes Brother   . Stroke Brother   . Heart attack Brother        MI  . Diabetes Other        Sister's daughter  . Depression Daughter        Bipolar              Social History:  reports that he quit smoking about 40 years ago. He has a 60.00 pack-year smoking history. He has never used smokeless tobacco. He reports that he does not drink alcohol and does not use drugs.  Allergies  Allergen Reactions  . Collodion Other (See  Comments)    Unknown reaction  . New Skin Other (See Comments)    Unknown reaction  . Tape Dermatitis    HOME MEDICATIONS:                                                                                                                     No current facility-administered medications on file prior to encounter.   Current Outpatient Medications on File Prior to Encounter  Medication Sig Dispense Refill  . acetaminophen (TYLENOL) 650 MG CR tablet Take 650-1,300 mg by mouth See admin instructions. Take one tablet (650  mg) by mouth in the morning and two tablets (1300 mg) at night - as needed for pain/headache    . allopurinol (ZYLOPRIM) 300 MG tablet TAKE 1 TABLET DAILY (Patient taking differently: Take 300 mg by mouth at bedtime. ) 90 tablet 3  . aspirin EC 325 MG EC tablet Take 1 tablet (325 mg total) by mouth daily. 180 tablet 0  . diphenhydrAMINE (BENADRYL ALLERGY) 25 MG tablet Take 1 tablet (25 mg total) by mouth at bedtime as needed for sleep. (Patient taking differently: Take 50 mg by mouth at bedtime. )    . DM-Phenylephrine-Acetaminophen (VICKS DAYQUIL COLD & FLU PO) Take 15 mLs by mouth daily as needed (cough).    [name] gentamicin cream (GARAMYCIN) 0.1 % Apply 1 application topically 2 (two) times daily. (Patient taking differently: Apply 1 application topically See admin instructions. Apply topically to left big toe after showering until healed) 30 g 1  . lisinopril (ZESTRIL) 5 MG tablet TAKE 1 TABLET DAILY (NEW DOSE) (Patient taking differently: Take 5 mg by mouth daily. ) 90 tablet 3  . nitroGLYCERIN (NITROSTAT) 0.4 MG SL tablet Place 1 tablet (0.4 mg total) under the tongue every 5 (five) minutes as needed for chest pain (max 3 doses in 15 minutes). 25 tablet 3  . simvastatin (ZOCOR) 20 MG tablet Take 1 tablet (20 mg total) by mouth at bedtime. 90 tablet 0  . valACYclovir (VALTREX) 500 MG tablet TAKE 1 TABLET DAILY (Patient taking differently: Take 500 mg by mouth daily. ) 90 tablet 3  .  vitamin B-12 (CYANOCOBALAMIN) 1000 MCG tablet Take 1 tablet (1,000 mcg total) by mouth daily.    [name] VITAMIN D PO Take 1 tablet by mouth daily.     [name] acetaminophen (TYLENOL) 325 MG tablet Take 2 tablets (650 mg total) by mouth every 4 (four) hours as needed for mild pain (or temp > 37.5 C (99.5 F)). (Patient not taking: Reported on 12/04/2019)    . donepezil (ARICEPT) 5 MG tablet Take 5 mg by mouth daily. (Patient not taking: Reported on 12/09/2019)      Inpatient Medications: Scheduled: .  stroke: mapping our early stages of recovery book   Does not apply Once  . allopurinol  300 mg Oral QHS  . aspirin  300 mg Rectal Daily   Or  . aspirin EC  325 mg Oral Daily  . carvedilol  3.125 mg Oral BID WC  . donepezil  5 mg Oral QHS  . enoxaparin (LOVENOX) injection  40 mg Subcutaneous Q24H  . simvastatin  20 mg Oral q1800     ROS:                                                                                                                                       Unable to obtain due to AMS.    Blood pressure (!) 147/75, pulse 70, temperature 97.9 F (36.6  C), resp. rate 16, height 5\' 9"  (1.753 m), weight 68 kg, SpO2 100 %.   General Examination:                                                                                                       Physical Exam  HEENT-  [place]/AT  Lungs- Respirations unlabored Extremities- Warm and well perfused  Neurological Examination Mental Status: Drowsy, with decreased alertness and impaired attention. Severely bradyphrenic. Speech is sparse. The one phrase he uttered was fluent but mildly dysarthric. He was unable to name a thumb or pinky finger. Stated it was Wednesday when asked. Unable to recall the month or year. Not oriented to the city, but did identify the state as [place]. Cranial Nerves: II: Not cooperative with visual field testing, but fixated on examiner's face several times. Pupils equal but not cooperative to testing light reflex.    III,IV, VI: No ptosis. Tracks examiner's face to the left and right. Gaze is conjugate. No nystagmus.  V,VII: Smiles spontaneously - symmetric. Reacts to touch bilaterally.  VIII: hearing intact to voice IX,X: Moderate to severely hypophonic speech XI: Did not comply with assessment XII: Did not protrude tongue to command.  Motor: Will not resist examiner when asked.  When BUE are passively raised, right remains elevated and left drifts back to bed in < 1 second.  With noxious plantar stimulation, RLE withdraws purposefully and vigorously, with less vigorous response on the left.  Sensory: Reacts to pinch BUE but not to thighs.  Deep Tendon Reflexes: 1+ bilateral brachioradialis. Trace patellars bilaterally.  Plantars: Right: downgoing  Left: downgoing Cerebellar: Not following commands.  Gait: Unable to assess   Lab Results: Basic Metabolic Panel: Recent Labs  Lab 12/04/19 2130 12/09/19 1959 12/10/19 1131  NA 140 140 140  K 4.4 3.8 3.4*  CL 106 100 104  CO2 25 28 24   GLUCOSE 112* 145* 141*  BUN 25* 39* 37*  CREATININE 1.13 1.37* 1.03  CALCIUM 9.9 10.1 9.2  MG  --   --  2.1    CBC: Recent Labs  Lab 12/04/19 2130 12/09/19 1959 12/10/19 1131  WBC 6.6 10.8* 6.1  NEUTROABS 5.0 8.1*  --   HGB 14.4 15.3 13.2  HCT 44.7 44.6 36.1*  MCV 93.9 91.8 87.4  PLT 161 211 149*    Cardiac Enzymes: No results for input(s): CKTOTAL, CKMB, CKMBINDEX, TROPONINI in the last 168 hours.  Lipid Panel: Recent Labs  Lab 12/10/19 0403  CHOL 106  TRIG 114  HDL 35*  CHOLHDL 3.0  VLDL 23  LDLCALC 48    Imaging: CT Head Wo Contrast  Result Date: 12/09/2019 CLINICAL DATA:  Mental status changes EXAM: CT HEAD WITHOUT CONTRAST TECHNIQUE: Contiguous axial images were obtained from the base of the skull through the vertex without intravenous contrast. COMPARISON:  12/04/2019 FINDINGS: Brain: There is atrophy and chronic small vessel disease changes. Old bilateral basal ganglia lacunar  infarcts. No acute intracranial abnormality. Specifically, no hemorrhage, hydrocephalus, mass lesion, acute infarction, or significant intracranial injury. Vascular: No hyperdense vessel or unexpected  calcification. Skull: No acute calvarial abnormality. Sinuses/Orbits: Visualized paranasal sinuses and mastoids clear. Orbital soft tissues unremarkable. Other: None IMPRESSION: Atrophy, chronic microvascular disease. No acute intracranial abnormality. Electronically Signed   By: Rolm Baptise M.D.   On: 12/09/2019 20:20   MR BRAIN WO CONTRAST  Result Date: 12/10/2019 CLINICAL DATA:  Initial evaluation for acute altered mental status. EXAM: MRI HEAD WITHOUT CONTRAST TECHNIQUE: Multiplanar, multiecho pulse sequences of the brain and surrounding structures were obtained without intravenous contrast. COMPARISON:  Prior CT from earlier same day. FINDINGS: Brain: Examination moderately degraded by motion artifact. Diffuse prominence of the CSF containing spaces compatible with moderately advanced age-related cerebral atrophy. Patchy and confluent T2/FLAIR hyperintensity within the periventricular and deep white matter both cerebral hemispheres most consistent with chronic small vessel ischemic disease, moderate in nature. Mild patchy involvement of the pons. Multiple scattered remote lacunar infarcts present about the bilateral basal ganglia, thalami, and pons. Associated chronic hemosiderin staining noted about a chronic lacunar infarct at the right lentiform nucleus. Probable additional tiny chronic remote right parietal infarct (series 15, image 42). No abnormal foci of restricted diffusion to suggest acute or subacute ischemia. Gray-white matter differentiation maintained. No evidence for acute intracranial hemorrhage. No mass lesion, midline shift or mass effect. Diffuse ventricular prominence related global parenchymal volume loss without hydrocephalus. No extra-axial fluid collection. Pituitary gland suprasellar  region within normal limits. Midline structures intact. Vascular: Major intracranial vascular flow voids are grossly maintained at the skull base. Skull and upper cervical spine: Craniocervical junction within normal limits. Bone marrow signal intensity normal. No scalp soft tissue abnormality. Sinuses/Orbits: Patient status post bilateral ocular lens replacement. Paranasal sinuses are largely clear. Small right mastoid effusion noted, of doubtful significance. Other: None. IMPRESSION: 1. No acute intracranial abnormality. 2. Moderately advanced age-related cerebral atrophy with chronic small vessel ischemic disease with multiple remote lacunar infarcts involving the bilateral basal ganglia, thalami, and pons. Electronically Signed   By: Jeannine Boga M.D.   On: 12/10/2019 00:08   US Carotid Bilateral (at [facility] and [facility] only)  Result Date: 12/10/2019 CLINICAL DATA:  86 year old male with TIA EXAM: BILATERAL CAROTID DUPLEX ULTRASOUND TECHNIQUE: Pearline Cables scale imaging, color Doppler and duplex ultrasound were performed of bilateral carotid and vertebral arteries in the neck. COMPARISON:  None. FINDINGS: Criteria: Quantification of carotid stenosis is based on velocity parameters that correlate the residual internal carotid diameter with NASCET-based stenosis levels, using the diameter of the distal internal carotid lumen as the denominator for stenosis measurement. The following velocity measurements were obtained: RIGHT ICA:  Systolic 494 cm/sec, Diastolic 28 cm/sec CCA:  94 cm/sec SYSTOLIC ICA/CCA RATIO:  1.6 ECA:  354 cm/sec LEFT ICA:  Systolic 496 cm/sec, Diastolic 21 cm/sec CCA:  58 cm/sec SYSTOLIC ICA/CCA RATIO:  1.8 ECA:  91 cm/sec Right Brachial SBP: Not acquired Left Brachial SBP: Not acquired RIGHT CAROTID ARTERY: No significant calcifications of the right common carotid artery. Intermediate waveform maintained. Moderate heterogeneous and partially calcified plaque at the right carotid bifurcation. No  significant lumen shadowing. Low resistance waveform of the right ICA. No significant tortuosity. RIGHT VERTEBRAL ARTERY: Antegrade flow with low resistance waveform. LEFT CAROTID ARTERY: No significant calcifications of the left common carotid artery. Intermediate waveform maintained. Moderate heterogeneous and partially calcified plaque at the left carotid bifurcation. No significant lumen shadowing. Low resistance waveform of the left ICA. No significant tortuosity. LEFT VERTEBRAL ARTERY:  Antegrade flow with low resistance waveform. IMPRESSION: Right: Heterogeneous and partially calcified plaque at the right carotid bifurcation,  with discordant results regarding degree of stenosis by established duplex criteria. Peak velocity suggests 50%-69% stenosis, with the ICA/ CCA ratio suggesting a lesser degree of stenosis. If establishing a more accurate degree of stenosis is required, cerebral angiogram should be considered, or as a second best test, CTA. Left: Color duplex indicates moderate heterogeneous and calcified plaque, with no hemodynamically significant stenosis by duplex criteria in the extracranial cerebrovascular circulation. Signed, Dulcy Fanny. Dellia Nims, RPVI [facility] Radiology Electronically Signed   By: Corrie Mckusick D.O.   On: 12/10/2019 11:35   ECHOCARDIOGRAM COMPLETE  Result Date: 12/10/2019    ECHOCARDIOGRAM REPORT   Patient Name:   ROCKLIN SODERQUIST [facility] Date of Exam: 12/10/2019 Medical Rec #:  [id number]        Height:       69.0 in Accession #:    [id number]       Weight:       150.0 lb Date of Birth:  1928-07-08        BSA:          1.828 m Patient Age:    66 years         BP:           173/76 mmHg Patient Gender: M                HR:           89 bpm. Exam Location:  [facility] Procedure: 2D Echo, Color Doppler and Cardiac Doppler Indications:     G45.9 TIA  History:         Patient has prior history of Echocardiogram examinations. CHF,                   CAD, Stroke; Risk Factors:Hypertension and Dyslipidemia.  Sonographer:     Charmayne Sheer RDCS (AE) Referring Phys:  [id number] Arvella Merles MANSY Diagnosing Phys: Ida Rogue MD  Sonographer Comments: Suboptimal apical window and suboptimal subcostal window. Image acquisition challenging due to uncooperative patient and Image acquisition challenging due to respiratory motion. IMPRESSIONS  1. Left ventricular ejection fraction, by estimation, is 60 to 65%. The left ventricle has normal function. The left ventricle has no regional wall motion abnormalities. Left ventricular diastolic parameters are consistent with Grade I diastolic dysfunction (impaired relaxation).  2. Right ventricular systolic function is normal. The right ventricular size is normal.  3. Mild aortic valve stenosis. FINDINGS  Left Ventricle: Left ventricular ejection fraction, by estimation, is 60 to 65%. The left ventricle has normal function. The left ventricle has no regional wall motion abnormalities. The left ventricular internal cavity size was normal in size. There is  no left ventricular hypertrophy. Left ventricular diastolic parameters are consistent with Grade I diastolic dysfunction (impaired relaxation). Right Ventricle: The right ventricular size is normal. No increase in right ventricular wall thickness. Right ventricular systolic function is normal. Left Atrium: Left atrial size was normal in size. Right Atrium: Right atrial size was normal in size. Pericardium: There is no evidence of pericardial effusion. Mitral Valve: The mitral valve is normal in structure. No evidence of mitral valve regurgitation. No evidence of mitral valve stenosis. MV peak gradient, 8.8 mmHg. The mean mitral valve gradient is 2.0 mmHg. Tricuspid Valve: The tricuspid valve is normal in structure. Tricuspid valve regurgitation is not demonstrated. No evidence of tricuspid stenosis. Aortic Valve: The aortic valve is normal in structure. Aortic valve regurgitation is not  visualized. Mild aortic stenosis is present. Aortic  valve mean gradient measures 7.0 mmHg. Aortic valve peak gradient measures 14.3 mmHg. Aortic valve area, by VTI measures 1.48 cm. Pulmonic Valve: The pulmonic valve was normal in structure. Pulmonic valve regurgitation is not visualized. No evidence of pulmonic stenosis. Aorta: The aortic root is normal in size and structure. Venous: The inferior vena cava is normal in size with greater than 50% respiratory variability, suggesting right atrial pressure of 3 mmHg. IAS/Shunts: No atrial level shunt detected by color flow Doppler.  LEFT VENTRICLE PLAX 2D LVIDd:         3.26 cm  Diastology LVIDs:         2.09 cm  LV e' medial:    3.81 cm/s LV PW:         1.10 cm  LV E/e' medial:  15.9 LV IVS:        0.98 cm  LV e' lateral:   8.16 cm/s LVOT diam:     2.10 cm  LV E/e' lateral: 7.4 LV SV:         61 LV SV Index:   33 LVOT Area:     3.46 cm  LEFT ATRIUM           Index LA diam:      2.40 cm 1.31 cm/m LA Vol (A4C): 25.4 ml 13.89 ml/m  AORTIC VALVE                    PULMONIC VALVE AV Area (Vmax):    1.57 cm     PV Vmax:       1.15 m/s AV Area (Vmean):   1.55 cm     PV Vmean:      76.600 cm/s AV Area (VTI):     1.48 cm     PV VTI:        0.204 m AV Vmax:           189.00 cm/s  PV Peak grad:  5.3 mmHg AV Vmean:          130.000 cm/s PV Mean grad:  3.0 mmHg AV VTI:            0.411 m AV Peak Grad:      14.3 mmHg AV Mean Grad:      7.0 mmHg LVOT Vmax:         85.70 cm/s LVOT Vmean:        58.300 cm/s LVOT VTI:          0.176 m LVOT/AV VTI ratio: 0.43  AORTA Ao Root diam: 3.10 cm MITRAL VALVE MV Area (PHT): 4.06 cm     SHUNTS MV Peak grad:  8.8 mmHg     Systemic VTI:  0.18 m MV Mean grad:  2.0 mmHg     Systemic Diam: 2.10 cm MV Vmax:       1.48 m/s MV Vmean:      70.8 cm/s MV Decel Time: 187 msec MV E velocity: 60.60 cm/s MV A velocity: 114.00 cm/s MV E/A ratio:  0.53 Ida Rogue MD Electronically signed by Ida Rogue MD Signature Date/Time: 12/10/2019/5:12:41 PM     Final     Assessment: 86 year old male with mild dementia and gradually worsening cognitive impairment x 1 year, presenting to the ED with acute AMS.  1. Exam reveals severe bradyphrenia with severe disorientation, poverty of speech, comprehension deficit and diffuse weakness, left greater than right. The latter finding is felt most likely to be secondary to one of the  remote ischemic infarctions seen on MRI.  2. Wife describes staring spells after which he is very tired and needs to sleep for several hours. This component of the history is concerning for subclinical seizures. Of note, he was seen previously by [facility] due to concern for new seizure, but his wife declined AED at that time.  3. History of stroke.   - MRI reveals moderately advanced age-related cerebral atrophy with chronic small vessel ischemic disease and multiple remote lacunar infarcts involving the bilateral basal ganglia, thalami, and pons.   - Carotid ultrasound: Right: Heterogeneous and partially calcified plaque at the right carotid bifurcation, with discordant results regarding degree of stenosis by established duplex criteria. Peak velocity suggests 50%-69% stenosis, with the ICA/ CCA ratio suggesting a lesser degree of stenosis. Left: Moderate heterogeneous and calcified plaque, with no hemodynamically significant stenosis by duplex criteria in the extracranial cerebrovascular circulation.  - TTE:  LVEF 60 to 65%. The left ventricle has normal function. The left ventricle has no regional wall motion abnormalities. Left ventricular diastolic parameters are consistent with Grade I diastolic dysfunction (impaired relaxation). Right ventricular systolic function is normal. The right ventricular size is normal.  Mild aortic valve stenosis. 4. Overall clinical impression: Overall presentation most consistent with AMS on underlying dementia, secondary to toxic/metabolic or infectious etiology, versus AMS secondary to postictal  state in the setting of spells suggestive of subclinical seizures. Acute stroke has been ruled out by MRI. TIA is felt to be unlikely.   Recommendations: 1. EEG (ordered).  2. Continue donepezil 5 mg po qhs. 3. Continue ASA. Has a history of intolerance to Plavix, per chart review.  4. Will need outpatient dementia evaluation with Neurology after discharge.  5. Further recommendations pending EEG result.  6. Agree with stopping home Benadryl, which certainly is sedating and due to anticholinergic effects can result in worsened dementia symptoms. This medication is also linked to an increased risk of future development of dementia in long-time users.  7. IVF. Current BUN/Cr ratio is elevated and patient appears somewhat dry. Use caution with fluids given his history of diastolic CHF.   Electronically signed: Dr. Kerney Elbe 12/10/2019, 5:15 PM

## 2019-12-10 NOTE — ED Notes (Signed)
Pts depends is clean upon assessment. Yellow grip socks applied and sheets pt repositioned. Call bell in reach.

## 2019-12-10 NOTE — ED Notes (Signed)
Breakfast tray given. °

## 2019-12-10 NOTE — Progress Notes (Addendum)
PROGRESS NOTE    Jimmy [name]  GMW:[id number] DOB: Aug 20, 1928 DOA: 12/09/2019 PCP: Ria Bush, MD  Assessment & Plan:   Active Problems:   TIA (transient ischemic attack)   Acute encephalopathy: etiology unclear, possibly worsening dementia vs infection. CT brain shows atrophy & no acute intracranial findings. MRI shows no acute intracranial findings, but multiple remote lacunar infarcts of b/l basal ganglia, thalami & pons.  Urine, blood cxs ordered. Continue w/ neuro checks. Continue on aspirin. PT/OT consulted. Neuro consulted  Elevated troponins: likely secondary to demand ischemia as per cardio. NSTEMI r/o.  Echo ordered. Continue on aspirin. Continue on tele. Cardio following and recs apprec   HTN: started on carvedilol as per cardio. Continue to hold lisinopril secondary to AKI   HLD: continue on home dose of statin  AKI: will hold home dose of lisinopril. Will continue to monitor   Dementia: will continue on home dose of donepezil   Gout: will continue on home dose of allopurinol    DVT prophylaxis: lovenox Code Status: partial  Family Communication: called pt's wife but no answer Disposition Plan: depends on PT/OT recs    Consultants:   Cardio    Procedures:    Antimicrobials:    Subjective: Pt is confused and intermittently answers questions appropriately.   Objective: Vitals:   12/09/19 1922 12/10/19 0424 12/10/19 0552  BP: 109/65 (!) 144/93 (!) 186/86  Pulse: 90 78 81  Resp: 18 17 12   SpO2: 96% 100% 100%  Weight: 68 kg    Height: 5\' 9"  (1.753 m)     No intake or output data in the 24 hours ending 12/10/19 0826 Filed Weights   12/09/19 1922  Weight: 68 kg    Examination:  General exam: Appears calm and comfortable. Frail appearing  Respiratory system: Clear to auscultation. Respiratory effort normal. Cardiovascular system: S1 & S2 +. No  rubs, gallops or clicks. No pedal edema. Gastrointestinal system: Abdomen is nondistended,  soft and nontender.  Normal bowel sounds heard. Central nervous system: Alert and awake. Moves all 4 extremities  Psychiatry: Judgement and insight appear abnormal. Flat mood and affect    Data Reviewed: I have personally reviewed following labs and imaging studies  CBC: Recent Labs  Lab 12/04/19 2130 12/09/19 1959  WBC 6.6 10.8*  NEUTROABS 5.0 8.1*  HGB 14.4 15.3  HCT 44.7 44.6  MCV 93.9 91.8  PLT 161 440   Basic Metabolic Panel: Recent Labs  Lab 12/04/19 2130 12/09/19 1959  NA 140 140  K 4.4 3.8  CL 106 100  CO2 25 28  GLUCOSE 112* 145*  BUN 25* 39*  CREATININE 1.13 1.37*  CALCIUM 9.9 10.1   GFR: Estimated Creatinine Clearance: 35.8 mL/min (A) (by C-G formula based on SCr of 1.37 mg/dL (H)). Liver Function Tests: Recent Labs  Lab 12/04/19 2130 12/09/19 1959  AST 22 28  ALT 21 27  ALKPHOS 108 110  BILITOT 1.1 1.2  PROT 6.7 7.5  ALBUMIN 3.9 4.3   No results for input(s): LIPASE, AMYLASE in the last 168 hours. No results for input(s): AMMONIA in the last 168 hours. Coagulation Profile: Recent Labs  Lab 12/04/19 2130  INR 1.0   Cardiac Enzymes: No results for input(s): CKTOTAL, CKMB, CKMBINDEX, TROPONINI in the last 168 hours. BNP (last 3 results) No results for input(s): PROBNP in the last 8760 hours. HbA1C: No results for input(s): HGBA1C in the last 72 hours. CBG: No results for input(s): GLUCAP in the last 168 hours. Lipid  Profile: Recent Labs    12/10/19 0403  CHOL 106  HDL 35*  LDLCALC 48  TRIG 114  CHOLHDL 3.0   Thyroid Function Tests: No results for input(s): TSH, T4TOTAL, FREET4, T3FREE, THYROIDAB in the last 72 hours. Anemia Panel: No results for input(s): VITAMINB12, FOLATE, FERRITIN, TIBC, IRON, RETICCTPCT in the last 72 hours. Sepsis Labs: No results for input(s): PROCALCITON, LATICACIDVEN in the last 168 hours.  Recent Results (from the past 240 hour(s))  SARS Coronavirus 2 by RT PCR (hospital order, performed in [facility] hospital lab) Nasopharyngeal Nasopharyngeal Swab     Status: None   Collection Time: 12/04/19  9:49 PM   Specimen: Nasopharyngeal Swab  Result Value Ref Range Status   SARS Coronavirus 2 NEGATIVE NEGATIVE Final    Comment: (NOTE) SARS-CoV-2 target nucleic acids are NOT DETECTED.  The SARS-CoV-2 RNA is generally detectable in upper and lower respiratory specimens during the acute phase of infection. The lowest concentration of SARS-CoV-2 viral copies this assay can detect is 250 copies / mL. A negative result does not preclude SARS-CoV-2 infection and should not be used as the sole basis for treatment or other patient management decisions.  A negative result may occur with improper specimen collection / handling, submission of specimen other than nasopharyngeal swab, presence of viral mutation(s) within the areas targeted by this assay, and inadequate number of viral copies (<250 copies / mL). A negative result must be combined with clinical observations, patient history, and epidemiological information.  Fact Sheet for Patients:   StrictlyIdeas.no  Fact Sheet for Healthcare Providers: BankingDealers.co.za  This test is not yet approved or  cleared by the [place] FDA and has been authorized for detection and/or diagnosis of SARS-CoV-2 by FDA under an Emergency Use Authorization (EUA).  This EUA will remain in effect (meaning this test can be used) for the duration of the COVID-19 declaration under Section 564(b)(1) of the Act, 21 U.S.C. section 360bbb-3(b)(1), unless the authorization is terminated or revoked sooner.  Performed at [facility], [place] [address] [place], [place] [address]   SARS Coronavirus 2 by RT PCR (hospital order, performed in [facility] hospital lab) Nasopharyngeal Nasopharyngeal Swab     Status: None   Collection Time: 12/10/19  4:03 AM   Specimen: Nasopharyngeal Swab  Result Value Ref Range  Status   SARS Coronavirus 2 NEGATIVE NEGATIVE Final    Comment: (NOTE) SARS-CoV-2 target nucleic acids are NOT DETECTED.  The SARS-CoV-2 RNA is generally detectable in upper and lower respiratory specimens during the acute phase of infection. The lowest concentration of SARS-CoV-2 viral copies this assay can detect is 250 copies / mL. A negative result does not preclude SARS-CoV-2 infection and should not be used as the sole basis for treatment or other patient management decisions.  A negative result may occur with improper specimen collection / handling, submission of specimen other than nasopharyngeal swab, presence of viral mutation(s) within the areas targeted by this assay, and inadequate number of viral copies (<250 copies / mL). A negative result must be combined with clinical observations, patient history, and epidemiological information.  Fact Sheet for Patients:   StrictlyIdeas.no  Fact Sheet for Healthcare Providers: BankingDealers.co.za  This test is not yet approved or  cleared by the [place] FDA and has been authorized for detection and/or diagnosis of SARS-CoV-2 by FDA under an Emergency Use Authorization (EUA).  This EUA will remain in effect (meaning this test can be used) for the duration of  the COVID-19 declaration under Section 564(b)(1) of the Act, 21 U.S.C. section 360bbb-3(b)(1), unless the authorization is terminated or revoked sooner.  Performed at [facility], [address], [place], [place] [address]          Radiology Studies: CT Head Wo Contrast  Result Date: 12/09/2019 CLINICAL DATA:  Mental status changes EXAM: CT HEAD WITHOUT CONTRAST TECHNIQUE: Contiguous axial images were obtained from the base of the skull through the vertex without intravenous contrast. COMPARISON:  12/04/2019 FINDINGS: Brain: There is atrophy and chronic small vessel disease changes. Old bilateral basal  ganglia lacunar infarcts. No acute intracranial abnormality. Specifically, no hemorrhage, hydrocephalus, mass lesion, acute infarction, or significant intracranial injury. Vascular: No hyperdense vessel or unexpected calcification. Skull: No acute calvarial abnormality. Sinuses/Orbits: Visualized paranasal sinuses and mastoids clear. Orbital soft tissues unremarkable. Other: None IMPRESSION: Atrophy, chronic microvascular disease. No acute intracranial abnormality. Electronically Signed   By: Rolm Baptise M.D.   On: 12/09/2019 20:20   MR BRAIN WO CONTRAST  Result Date: 12/10/2019 CLINICAL DATA:  Initial evaluation for acute altered mental status. EXAM: MRI HEAD WITHOUT CONTRAST TECHNIQUE: Multiplanar, multiecho pulse sequences of the brain and surrounding structures were obtained without intravenous contrast. COMPARISON:  Prior CT from earlier same day. FINDINGS: Brain: Examination moderately degraded by motion artifact. Diffuse prominence of the CSF containing spaces compatible with moderately advanced age-related cerebral atrophy. Patchy and confluent T2/FLAIR hyperintensity within the periventricular and deep white matter both cerebral hemispheres most consistent with chronic small vessel ischemic disease, moderate in nature. Mild patchy involvement of the pons. Multiple scattered remote lacunar infarcts present about the bilateral basal ganglia, thalami, and pons. Associated chronic hemosiderin staining noted about a chronic lacunar infarct at the right lentiform nucleus. Probable additional tiny chronic remote right parietal infarct (series 15, image 42). No abnormal foci of restricted diffusion to suggest acute or subacute ischemia. Gray-white matter differentiation maintained. No evidence for acute intracranial hemorrhage. No mass lesion, midline shift or mass effect. Diffuse ventricular prominence related global parenchymal volume loss without hydrocephalus. No extra-axial fluid collection. Pituitary  gland suprasellar region within normal limits. Midline structures intact. Vascular: Major intracranial vascular flow voids are grossly maintained at the skull base. Skull and upper cervical spine: Craniocervical junction within normal limits. Bone marrow signal intensity normal. No scalp soft tissue abnormality. Sinuses/Orbits: Patient status post bilateral ocular lens replacement. Paranasal sinuses are largely clear. Small right mastoid effusion noted, of doubtful significance. Other: None. IMPRESSION: 1. No acute intracranial abnormality. 2. Moderately advanced age-related cerebral atrophy with chronic small vessel ischemic disease with multiple remote lacunar infarcts involving the bilateral basal ganglia, thalami, and pons. Electronically Signed   By: Jeannine Boga M.D.   On: 12/10/2019 00:08        Scheduled Meds: .  stroke: mapping our early stages of recovery book   Does not apply Once  . aspirin  300 mg Rectal Daily   Or  . aspirin EC  325 mg Oral Daily  . enoxaparin (LOVENOX) injection  40 mg Subcutaneous Q24H   Continuous Infusions: . sodium chloride 100 mL/hr at 12/10/19 0423     LOS: 0 days    Time spent: 33 mins    Wyvonnia Dusky, MD [facility] Pager [phone]  If 7PM-7AM, please contact night-coverage [email or web address] 12/10/2019, 8:26 AM

## 2019-12-10 NOTE — Consult Note (Addendum)
Cardiology Consultation:   [name] MRN: [id number]; DOB: 04/16/1928  Admit date: 12/09/2019 Date of Consult: 12/10/2019  Primary Care Provider: Ria Bush, MD Primary Cardiologist: Jimmy Rogue, MD  Primary Electrophysiologist:  None    Patient Profile:   Jimmy [name] is a 86 y.o. male with a hx of CAD s/p four-vessel CABG 04/2028, HFpEF, CLL (WBC ~50K, 6 rounds of treatment and finished 03/2014), TIA initially on Plavix from changed to Aggrenox following recurrent symptoms, stroke noted on imaging 2018 with hemorrhagic CVA 04/2018 in the setting of hypertensive crisis as well as Aggrenox with recurrent ischemic CVA in 07/2018 with residual left-sided hemiparesis, dysphagia, dysarthria along with gait disturbance, mild to moderate carotid artery disease, hypertension, hyperlipidemia, and prior tobacco use (28 years), who is being seen today for the evaluation of elevated troponin at the request of Jimmy [name].  History of Present Illness:   Jimmy [name] is an 86 year old male with PMH as above.  He has history of 04/2018 CVA and possible TIA 04/2016.  10/2013 MPI showed no significant ischemia with EF greater than 55%.  04/2016 echo showed normal LVSF, severe focal basal hypertrophy, NRWMA, G1DD, severe diffuse thickening and calcification involving the right coronary cusp and noncoronary cusp of the aortic valve, and mild AS/AI with trivial TR/PR.  07/2016 cardiac monitoring showed NSR without significant arrhythmia.  Virtual visit 07/2018 noted left-sided deficits.  He was working with PT for his recurrent CVAs.   In 07/2018, he was admitted to [facility] with ischemic infarct involving the subcortical left frontal lobe with noted multifocal chronic infarcts involving the bilateral basal ganglia, left thalamus, and pons.  MRA head was negative for large vessel occlusion or hemodynamically significant stenosis.  Carotid artery ultrasound showed bilateral stenosis as copied in  CV studies below.  Echo showed EF 60 to 65%, G1 DD, degenerative mitral valve, mild AR.   He was seen at [facility] ED 08/2018 with weakness and AMS of uncertain etiology.  CT negative for acute intracranial abnormality.  BP 90s over 50s.  He was last seen in office 10/19/2019 by Jimmy [name].  He presented with his wife and reported gait instability and falls that resulted in some bruising but no traumatic injury requiring further evaluation.  He reported better movement of his left hand with physical therapy.  He was working with an Engineer, production every night and doing exercises.  His wife indicated frustration regarding his sleep schedule, as he would often sleep till 1:00 PM.  She stated he did not do his exercise on a regular basis.  She had seen him do his exercises 1 time and stated it seemed like he wanted to give up.  Also noted was that he had Na 10 pound weight loss over the last 6 months and noted to be missing meals.  He denied any GI symptoms on ASA 325 mg. He was continued on his current medications without changes.  It was noted that he should watch for hypotension with weight loss.  Strategies were discussed for maintaining strength and ability to walk.  On 9/4 the pt presented to the [facility] emergency department after an episode of confusion and low BP (reported per wife).  He had felt weak and more fatigued in the 1-2 days leading up to this episode.  He also had a nonproductive cough, which the pt's wife reports as chronic and often after eating.  No chest pain or shortness of breath.  She is uncertain if he had  any fevers.  She stated that he was eating well drinking fluids.  He did report left arm pain; however, this was chronic and left over from his stroke.  Then, on 9/4, he went to the bathroom. She came back to check on him 15 minutes later, and he was weak and having difficulty with getting up from the toilet.  She called the neighbor over to help her put him on the bed.  He was speaking to them  during this time but not making sense with slurred speech.  She called EMS.  On their arrival, he reportedly started to improve and symptoms.  The entire episode lasted approximately 45 minutes.  Per wife, the patient's blood pressure was low with systolic in the 58N before presenting to the ED.  However, once in the ED, BP 197/74 with HR 68 bpm.  Per review of ED note, suspicion for stroke was low and it was suspected symptoms most likely related to vasovagal episode while on the toilet.  He was discharged home.  She states that once home, the patient continued to do well for the rest of the day.  He then went to sleep, and when he woke up the following morning, he was no longer making sense again and " talking out of his head."  She therefore decided to take him to [facility] ED.  Of note, she reports dark/amber urine for some time now and since the patient started to drink large amounts of caffeinated sweet tea.  She reports that he drinks this instead of water.   In the ED, vital signs were normal on initial check;however, repeat vitals since that time have shown elevated pressures with SBP into the 180s and DBP 90s.  Rates have been relatively well controlled with HR 70s to 90s.  96% to 100% ORA.  Labs significant for leukocytosis, AKI (baseline creatinine ~1.1),  and high-sensitivity troponin with peak 445 and currently downtrending.  Head CT showed moderately advanced age-related cerebral atrophy with chronic small vessel ischemic disease and multiple remote lacunar infarcts involving both basal ganglia, thalami, and pons.  No acute intracranial abnormality.  EKG showed NSR without any significant ST/T changes.  Telemetry without arrhythmia.  Bilateral carotids and echo were ordered with Plavix added to his ASA.  Neurology consulted.  Cardiology consulted regarding elevated troponin.  Heart Pathway Score:     Past Medical History:  Diagnosis Date  . Actinic keratosis   . Basal cell carcinoma 02/15/2016     Right lateral neck infraauricular of SCC scar  . Basal cell carcinoma 02/15/2016   Left chin  . Basal cell carcinoma 03/08/2014   Right of midline forehead  . CAD (coronary artery disease)    a. 04/2001 S/P CABGx 4;  b. 2008 MV:  EF 63% normal perfusion.  . Carotid stenosis    a. 11/2012 Carotid U/S:  RICA 27-78%, LICA 24-23%.  . Chronic kidney disease, stage 3   . CLL (chronic lymphoblastic leukemia) 2009   Stage IV; Dr. Ivor Messier - referred to Dr. Lissa Merlin [facility] 07/2013 now on Rituxan (10/2013) --> stage 0 (09/2014)  . Diastolic CHF ([place])    a. 07/3612 Echo: EF 55-65%, mild conc LVH, Gr 1 DD, mild AS, triv AI, mildly dil Ao root (3.5 cm).  . GERD (gastroesophageal reflux disease) 1990s  . History of CVA (cerebrovascular accident) 2015   by MRI - remote L internal capsule  . History of herpes genitalis    valtrex daily  . Hyperlipemia  2002  . Hypertension 2002  . Mass of submandibular region 2015   referred to gen surg after chemo  . Mild aortic stenosis    a. 07/2011 Echo: Mild AS, triv AI.  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 Skin cancer   . Squamous cell carcinoma of skin 10/05/2014   Right mid posterior ear  . Stroke ([place])   . Thrombocytopenia ([place])    outpatient goals Hgb >9, plt >20  . TIA (transient ischemic attack)    04/2016    Past Surgical History:  Procedure Laterality Date  . ANGIOPLASTY  1998  . CATARACT EXTRACTION, BILATERAL     R 1/09, L 8/09  . COLONOSCOPY  11/29/1987   Normal  . COLONOSCOPY  02/07/2003   Hemm. Internal, focal proctitis, negative biopsy  . COLONOSCOPY  12/12/2004   Internal external hemorrhoids, +proctits, negative biopsy  . CORONARY ANGIOPLASTY  1998  . CORONARY ARTERY BYPASS GRAFT  04/23/2001   x4, Dr. Pia Mau  . ESOPHAGOGASTRODUODENOSCOPY  11/29/1987   Gastritis  . ETT  12/10/2006   Persantine myoview nml  . HEMORROIDECTOMY  1954   Fissure repair, [place]  . HEPATIC ARTERY ANGIOPLASTY  1954   [place]  . KIDNEY STONE SURGERY  1977   Dr Redmond Baseman  . LITHOTRIPSY  1990s   Multiple  . US ECHOCARDIOGRAPHY  07/2011   nl sys fxn, EF 55-60%, grade 1 diast dysfunction, mild AS, mildly elevated PA pressure     Home Medications:  Prior to Admission medications   Medication Sig Start Date End Date Taking? Authorizing Provider  acetaminophen (TYLENOL) 650 MG CR tablet Take 650-1,300 mg by mouth See admin instructions. Take one tablet (650 mg) by mouth in the morning and two tablets (1300 mg) at night - as needed for pain/headache   Yes [provider]  allopurinol (ZYLOPRIM) 300 MG tablet TAKE 1 TABLET DAILY Patient taking differently: Take 300 mg by mouth at bedtime.  02/24/19  Yes Jimmy Bush, MD  aspirin EC 325 MG EC tablet Take 1 tablet (325 mg total) by mouth daily. 08/29/18  Yes Salary, Avel Peace, MD  diphenhydrAMINE (BENADRYL ALLERGY) 25 MG tablet Take 1 tablet (25 mg total) by mouth at bedtime as needed for sleep. Patient taking differently: Take 50 mg by mouth at bedtime.  09/10/18  Yes Jimmy Bush, MD  DM-Phenylephrine-Acetaminophen [facility] DAYQUIL COLD & FLU PO) Take 15 mLs by mouth daily as needed (cough).   Yes [provider]  gentamicin cream (GARAMYCIN) 0.1 % Apply 1 application topically 2 (two) times daily. Patient taking differently: Apply 1 application topically See admin instructions. Apply topically to left big toe after showering until healed 09/01/19  Yes Edrick Kins, DPM  lisinopril (ZESTRIL) 5 MG tablet TAKE 1 TABLET DAILY (NEW DOSE) Patient taking differently: Take 5 mg by mouth daily.  12/21/18  Yes Jimmy Bush, MD  nitroGLYCERIN (NITROSTAT) 0.4 MG SL tablet Place 1 tablet (0.4 mg total) under the tongue every 5 (five) minutes as needed for chest pain (max 3 doses in 15 minutes). 09/02/18  Yes Jimmy Bush, MD  simvastatin (ZOCOR) 20 MG tablet Take 1 tablet (20 mg total) by mouth at bedtime. 10/07/19  Yes Jimmy Bush, MD   valACYclovir (VALTREX) 500 MG tablet TAKE 1 TABLET DAILY Patient taking differently: Take 500 mg by mouth daily.  07/12/19  Yes Jimmy Bush, MD  vitamin B-12 (CYANOCOBALAMIN) 1000 MCG tablet Take 1 tablet (1,000 mcg  total) by mouth daily. 06/17/17  Yes Jimmy Bush, MD  VITAMIN D PO Take 1 tablet by mouth daily.    Yes [provider]  acetaminophen (TYLENOL) 325 MG tablet Take 2 tablets (650 mg total) by mouth every 4 (four) hours as needed for mild pain (or temp > 37.5 C (99.5 F)). Patient not taking: Reported on 12/04/2019 04/29/18   Angiulli, Lavon Paganini, PA-C  donepezil (ARICEPT) 5 MG tablet Take 5 mg by mouth daily. Patient not taking: Reported on 12/09/2019 07/29/19   [provider]    Inpatient Medications: Scheduled Meds: .  stroke: mapping our early stages of recovery book   Does not apply Once  . allopurinol  300 mg Oral Daily  . aspirin  300 mg Rectal Daily   Or  . aspirin EC  325 mg Oral Daily  . donepezil  5 mg Oral QHS  . enoxaparin (LOVENOX) injection  40 mg Subcutaneous Q24H  . simvastatin  20 mg Oral q1800   Continuous Infusions: . sodium chloride 100 mL/hr at 12/10/19 0423   PRN Meds: acetaminophen **OR** acetaminophen (TYLENOL) oral liquid 160 mg/5 mL **OR** acetaminophen, morphine injection, nitroGLYCERIN, senna-docusate  Allergies:    Allergies  Allergen Reactions  . Collodion Other (See Comments)    Unknown reaction  . New Skin Other (See Comments)    Unknown reaction  . Tape Dermatitis    Social History:   Social History   Socioeconomic History  . Marital status: Married    Spouse name: Not on file  . Number of children: Not on file  . Years of education: Not on file  . Highest education level: Not on file  Occupational History  . Occupation: Retired Information systems manager: RETIRED  Tobacco Use  . Smoking status: Former Smoker    Packs/day: 2.00    Years: 30.00    Pack years: 60.00    Quit date: 02/28/1979    Years  since quitting: 40.8  . Smokeless tobacco: Never Used  . Tobacco comment: quit 1980  Vaping Use  . Vaping Use: Never used  Substance and Sexual Activity  . Alcohol use: No    Alcohol/week: 0.0 standard drinks    Comment: occasional wine  . Drug use: Never  . Sexual activity: Yes  Other Topics Concern  . Not on file  Social History Narrative   Married and lives with wife   1 son died of lung cancer at 73 years old   75 daughter bipolar, lives    Activity: walks 1 mi daily on treadmill    Diet: good water, some fruits/vegetables    Social Determinants of Health   Financial Resource Strain:   . Difficulty of Paying Living Expenses: Not on file  Food Insecurity:   . Worried About Charity fundraiser in the Last Year: Not on file  . Ran Out of Food in the Last Year: Not on file  Transportation Needs:   . Lack of Transportation (Medical): Not on file  . Lack of Transportation (Non-Medical): Not on file  Physical Activity:   . Days of Exercise per Week: Not on file  . Minutes of Exercise per Session: Not on file  Stress:   . Feeling of Stress : Not on file  Social Connections:   . Frequency of Communication with Friends and Family: Not on file  . Frequency of Social Gatherings with Friends and Family: Not on file  . Attends Religious Services: Not on file  .  Active Member of Clubs or Organizations: Not on file  . Attends Archivist Meetings: Not on file  . Marital Status: Not on file  Intimate Partner Violence:   . Fear of Current or Ex-Partner: Not on file  . Emotionally Abused: Not on file  . Physically Abused: Not on file  . Sexually Abused: Not on file    Family History:    Family History  Problem Relation Age of Onset  . Hypertension Mother   . Heart disease Mother   . Hyperlipidemia Mother   . Hypertension Father   . Hyperlipidemia Father   . Kidney cancer Sister        Renal cell cancer  . Alcohol abuse Brother   . Diabetes Brother   . Stroke  Brother   . Heart attack Brother        MI  . Diabetes Other        Sister's daughter  . Depression Daughter        Bipolar     ROS:  Please see the history of present illness.  Review of Systems  Unable to perform ROS: Mental status change  All other systems reviewed and are negative. Mental status change and dementia. All other ROS reviewed and negative.     Physical Exam/Data:   Vitals:   12/09/19 1922 12/10/19 0424 12/10/19 0552  BP: 109/65 (!) 144/93 (!) 186/86  Pulse: 90 78 81  Resp: 18 17 12   SpO2: 96% 100% 100%  Weight: 68 kg    Height: 5\' 9"  (1.753 m)     No intake or output data in the 24 hours ending 12/10/19 0904 Last 3 Weights 12/09/2019 12/04/2019 11/15/2019  Weight (lbs) 150 lb 143 lb 15.4 oz 144 lb  Weight (kg) 68.04 kg 65.3 kg 65.318 kg     Body mass index is 22.15 kg/m.  General: Frail and elderly male, no acute distress, getting his echo performed. HEENT: normal Neck: no JVD Vascular: No carotid bruits; radial pulses 2+ bilaterally Cardiac:  normal S1, S2; RRR; soft heart sounds, 1/6 systolic murmur  Lungs: Trace left sided base crackles.  +rhonchi, cleared with cough..   Abd: soft, nontender, no hepatomegaly  Ext: no edema Musculoskeletal:  No deformities, BUE and BLE strength normal and equal Skin: warm and dry  Neuro:  No focal abnormalities noted Psych:  Normal affect   EKG:  The EKG was personally reviewed and demonstrates:  NSR, 85 bpm, poor R wave wave progression noted V4 to V6, no acute changes from previous Telemetry:  Telemetry was personally reviewed and demonstrates:  NSR, 70-90s  Relevant CV Studies: Pending updated bilateral carotids and echo  Carotids 12/2019 Summary:  Right Carotid: Velocities in the right ICA are consistent with a 1-39%  stenosis.         Non-hemodynamically significant plaque <50% noted in the  CCA. The         ECA appears <50% stenosed.  Left Carotid: Velocities in the left ICA are  consistent with a 1-39%  stenosis.        Non-hemodynamically significant plaque <50% noted in the  CCA. The        ECA appears <50% stenosed.  Vertebrals: Bilateral vertebral arteries demonstrate antegrade flow.  Subclavians: Normal flow hemodynamics were seen in bilateral subclavian        arteries.  *See table(s) above for measurements and observations.    Echo 07/2018 1. The left ventricle has normal systolic function with an  ejection  fraction of 60-65%. The cavity size was normal. Left ventricular diastolic  Doppler parameters are consistent with impaired relaxation.  2. The right ventricle has normal systolic function. The cavity was  normal. There is mildly increased right ventricular wall thickness. Right  ventricular systolic pressure could not be assessed.  3. Left atrial size was not well visualized.  4. The mitral valve is degenerative. Mild thickening of the mitral valve  leaflet.  5. The aortic valve has an indeterminate number of cusps. Aortic valve  regurgitation is mild by color flow Doppler. No stenosis of the aortic  valve.  6. The aortic root is normal in size and structure.   Laboratory Data:  High Sensitivity Troponin:   Recent Labs  Lab 12/09/19 1959 12/10/19 0403  TROPONINIHS 445* 356*     Cardiac EnzymesNo results for input(s): TROPONINI in the last 168 hours. No results for input(s): TROPIPOC in the last 168 hours.  Chemistry Recent Labs  Lab 12/04/19 2130 12/09/19 1959  NA 140 140  K 4.4 3.8  CL 106 100  CO2 25 28  GLUCOSE 112* 145*  BUN 25* 39*  CREATININE 1.13 1.37*  CALCIUM 9.9 10.1  GFRNONAA 58* 46*  GFRAA >60 53*  ANIONGAP 9 12    Recent Labs  Lab 12/04/19 2130 12/09/19 1959  PROT 6.7 7.5  ALBUMIN 3.9 4.3  AST 22 28  ALT 21 27  ALKPHOS 108 110  BILITOT 1.1 1.2   Hematology Recent Labs  Lab 12/04/19 2130 12/09/19 1959  WBC 6.6 10.8*  RBC 4.76 4.86  HGB 14.4 15.3  HCT 44.7 44.6  MCV  93.9 91.8  MCH 30.3 31.5  MCHC 32.2 34.3  RDW 13.3 13.5  PLT 161 211   BNPNo results for input(s): BNP, PROBNP in the last 168 hours.  DDimer No results for input(s): DDIMER in the last 168 hours.   Radiology/Studies:  CT Head Wo Contrast  Result Date: 12/09/2019 CLINICAL DATA:  Mental status changes EXAM: CT HEAD WITHOUT CONTRAST TECHNIQUE: Contiguous axial images were obtained from the base of the skull through the vertex without intravenous contrast. COMPARISON:  12/04/2019 FINDINGS: Brain: There is atrophy and chronic small vessel disease changes. Old bilateral basal ganglia lacunar infarcts. No acute intracranial abnormality. Specifically, no hemorrhage, hydrocephalus, mass lesion, acute infarction, or significant intracranial injury. Vascular: No hyperdense vessel or unexpected calcification. Skull: No acute calvarial abnormality. Sinuses/Orbits: Visualized paranasal sinuses and mastoids clear. Orbital soft tissues unremarkable. Other: None IMPRESSION: Atrophy, chronic microvascular disease. No acute intracranial abnormality. Electronically Signed   By: Rolm Baptise M.D.   On: 12/09/2019 20:20   MR BRAIN WO CONTRAST  Result Date: 12/10/2019 CLINICAL DATA:  Initial evaluation for acute altered mental status. EXAM: MRI HEAD WITHOUT CONTRAST TECHNIQUE: Multiplanar, multiecho pulse sequences of the brain and surrounding structures were obtained without intravenous contrast. COMPARISON:  Prior CT from earlier same day. FINDINGS: Brain: Examination moderately degraded by motion artifact. Diffuse prominence of the CSF containing spaces compatible with moderately advanced age-related cerebral atrophy. Patchy and confluent T2/FLAIR hyperintensity within the periventricular and deep white matter both cerebral hemispheres most consistent with chronic small vessel ischemic disease, moderate in nature. Mild patchy involvement of the pons. Multiple scattered remote lacunar infarcts present about the  bilateral basal ganglia, thalami, and pons. Associated chronic hemosiderin staining noted about a chronic lacunar infarct at the right lentiform nucleus. Probable additional tiny chronic remote right parietal infarct (series 15, image 42). No abnormal foci of restricted diffusion  to suggest acute or subacute ischemia. Gray-white matter differentiation maintained. No evidence for acute intracranial hemorrhage. No mass lesion, midline shift or mass effect. Diffuse ventricular prominence related global parenchymal volume loss without hydrocephalus. No extra-axial fluid collection. Pituitary gland suprasellar region within normal limits. Midline structures intact. Vascular: Major intracranial vascular flow voids are grossly maintained at the skull base. Skull and upper cervical spine: Craniocervical junction within normal limits. Bone marrow signal intensity normal. No scalp soft tissue abnormality. Sinuses/Orbits: Patient status post bilateral ocular lens replacement. Paranasal sinuses are largely clear. Small right mastoid effusion noted, of doubtful significance. Other: None. IMPRESSION: 1. No acute intracranial abnormality. 2. Moderately advanced age-related cerebral atrophy with chronic small vessel ischemic disease with multiple remote lacunar infarcts involving the bilateral basal ganglia, thalami, and pons. Electronically Signed   By: Jeannine Boga M.D.   On: 12/10/2019 00:08    Assessment and Plan:   Elevated high-sensitivity troponin, supply demand ischemia CAD s/p CABG without angina --Dementia and AMS precludes ROS.  Per wife, no chest pain or shortness of breath reported leading up to his admission.   --HS Tn peak at 445, currently down-trending.  --EKG without acute ST/T changes. Suspect supply demand ischemia in the setting of his comorbid conditions, including current elevated BP and AKI.  --No plan for further ischemic workup or emergent catheterization. He is also noted to not be an  ideal candidate for intervention given his underlying dementia and the need for medication compliance if stent placement.  --Repeat echo has been obtained to recheck EF and rule out WMA, acute structural changes.  --Continue ASA 325mg  per neurology recommendations.  Consider PPI given the reported cough after eating as above, as this could be 2/2 gastritis on high-dose ASA.  He denies any melena or s/sx of bleeding leading up to admission.   --Started on low-dose BB to control rates and BP.  If BP still remains uncontrolled, consider CCB for further support but will defer pending repeat echo. Given his AKI, lisinopril has been held - restart with improvement in renal function for further BP support.  --Risk factor modification.  Continue current statin.  Essential hypertension --BP not well controlled.  Added low dose BB per GDMT and for further BP control.  Titrate BB as needed for heart rate and BP control.  As above, ACE inhibitor has been held in the setting of AKI and can be restarted once renal function improves.  Per rounding cardiologist, consider CCB for further support pending echo results.  HLD --LDL 48.Continue statin therapy.  AOCKD --Baseline Cr 1.1 with current creatinine 1.37 and BUN 39.  Ordered repeat BMET.  Restart ACE inhibitor once improvement in renal function.  Patient's wife reports adequate oral intake and hydration leading up to admission; however, she also notes amber urine and large amounts of caffeinated sweet tea.  Caution with IV fluids as repeat echo still pending and discontinue if Cr improves on repeat BMET.  Addendum: Creatinine improved on repeat BMET.  IVF discontinued.  Peripheral arterial disease, Carotid artery dz --Continue ASA and statin therapy.  Ultrasound of the carotids was ordered by internal medicine and pending.  Chronic lymphocytic leukemia --Per IM, oncology.    History of CVA --Continue ASA 325mg .  Imaging without acute findings.  MRI without  acute intracranial abnormality and showing moderately advanced age-related cerebral atrophy with chronic small vessel ischemic disease and multiple remote lacunar infarcts involving the basal ganglia, thalami, and pons.  As above, he has prior stroke 2018  and 04/2018.   --Continue to work with physical therapy to maintain strength as scheduled/directed.  AMS --Consider underlying dementia as contributing to his current presentation.  Ultrasound ordered by internal medicine and pending.  MRI without acute abnormality as above.  Suspect some element of dehydration as well, given he is drinking large amounts of caffeinated sweet tea with brown urine. Per pt wife, he stays well hydrated. Consider elevated BP as also contributing.  Remainder per IM.   For questions or updates, please contact [place] Please consult [email or web address] for contact info under     Signed, Arvil Chaco, PA-C  12/10/2019 9:04 AM

## 2019-12-10 NOTE — Evaluation (Signed)
Speech Language Pathology Evaluation Patient Details Name: Jimmy [name] MRN: [id number] DOB: 09-15-1928 Today's Date: 12/10/2019 Time: 0900-0920 SLP Time Calculation (min) (ACUTE ONLY): 20 min  Problem List:  Patient Active Problem List   Diagnosis Date Noted  . Unresponsive episode 08/25/2019  . Palliative care patient 07/03/2019  . Visual hallucinations 07/03/2019  . Fecal impaction ([place]) 06/23/2019  . DNR (do not resuscitate) 05/28/2019  . Chronic constipation with overflow incontinence 02/02/2019  . Slurred speech 08/27/2018  . Chronic diastolic CHF (congestive heart failure) ([place]) 04/30/2018  . Left spastic hemiparesis ([place]) 04/30/2018  . Hypertension with goal blood pressure less than 120/80   . Dysphagia, post-stroke   . History of hemorrhagic stroke with residual hemiparesis ([place]) 04/04/2018  . Mixed Alzheimer's and vascular dementia ([place]) 06/17/2017  . TIA (transient ischemic attack) 04/08/2016  . Thrombocytopenia ([place]) 04/08/2016  . General weakness 11/11/2014  . Advanced care planning/counseling discussion 09/16/2014  . Nocturia 09/16/2014  . Mass of submandibular region   . History of stroke with residual deficit   . 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 Chest pressure 11/19/2013  . S/P CABG (coronary artery bypass graft) 11/19/2013  . Chronic kidney disease, stage 3   . Aortic stenosis   . Medicare annual wellness visit, subsequent 08/27/2012  . Vitamin D deficiency 08/17/2012  . Dyspnea 06/26/2011  . Erectile dysfunction 09/18/2010  . Carotid artery stenosis 12/01/2009  . UNSPECIFIED SUBJECTIVE VISUAL DISTURBANCE 05/01/2009  . BACK PAIN, LUMBAR 10/24/2008  . Chronic lymphocytic leukemia, Rai stage 0 ([place]) 04/02/2007    Class: Chronic  . DERMATITIS, SEBORRHEIC NOS 12/16/2006  . GENITAL HERPES 12/04/2006  . Mixed hyperlipidemia 12/04/2006  . Essential hypertension 12/04/2006  . Coronary atherosclerosis 12/04/2006  . GERD 12/04/2006  .  Prediabetes 12/04/2006  . RENAL CALCULUS, HX OF 12/04/2006   Past Medical History:  Past Medical History:  Diagnosis Date  . Actinic keratosis   . Basal cell carcinoma 02/15/2016   Right lateral neck infraauricular of SCC scar  . Basal cell carcinoma 02/15/2016   Left chin  . Basal cell carcinoma 03/08/2014   Right of midline forehead  . CAD (coronary artery disease)    a. 04/2001 S/P CABGx 4;  b. 2008 MV:  EF 63% normal perfusion.  . Carotid stenosis    a. 11/2012 Carotid U/S:  RICA 14-43%, LICA 15-40%.  . Chronic kidney disease, stage 3   . CLL (chronic lymphoblastic leukemia) 2009   Stage IV; Dr. Ivor Messier - referred to Dr. Lissa [facility] 07/2013 now on Rituxan (10/2013) --> stage 0 (09/2014)  . Diastolic CHF ([place])    a. 0/8676 Echo: EF 55-65%, mild conc LVH, Gr 1 DD, mild AS, triv AI, mildly dil Ao root (3.5 cm).  . GERD (gastroesophageal reflux disease) 1990s  . History of CVA (cerebrovascular accident) 2015   by MRI - remote L internal capsule  . History of herpes genitalis    valtrex daily  . Hyperlipemia 2002  . Hypertension 2002  . Mass of submandibular region 2015   referred to gen surg after chemo  . Mild aortic stenosis    a. 07/2011 Echo: Mild AS, triv AI.  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 Skin cancer   . Squamous cell carcinoma of skin 10/05/2014   Right mid posterior ear  . Stroke ([place])   . Thrombocytopenia ([place])    outpatient goals Hgb >9, plt >20  . TIA (transient ischemic attack)  04/2016   Past Surgical History:  Past Surgical History:  Procedure Laterality Date  . ANGIOPLASTY  1998  . CATARACT EXTRACTION, BILATERAL     R 1/09, L 8/09  . COLONOSCOPY  11/29/1987   Normal  . COLONOSCOPY  02/07/2003   Hemm. Internal, focal proctitis, negative biopsy  . COLONOSCOPY  12/12/2004   Internal external hemorrhoids, +proctits, negative biopsy  . CORONARY ANGIOPLASTY  1998  . CORONARY ARTERY BYPASS GRAFT  04/23/2001   x4, Dr.  Pia Mau  . ESOPHAGOGASTRODUODENOSCOPY  11/29/1987   Gastritis  . ETT  12/10/2006   Persantine myoview nml  . HEMORROIDECTOMY  1954   Fissure repair, [place]  . HEPATIC ARTERY ANGIOPLASTY  1954   [place], [place]  . KIDNEY STONE SURGERY  1977   Dr Redmond Baseman  . LITHOTRIPSY  1990s   Multiple  . US ECHOCARDIOGRAPHY  07/2011   nl sys fxn, EF 55-60%, grade 1 diast dysfunction, mild AS, mildly elevated PA pressure   HPI:  Jimmy [name] is a 86 y.o. male with PMH/o basal cell carcinoma, CAD, carotid stenosis, chronic kidney disease, CVA with left-sided deficits who presents for evaluation of slurred speech, generalized weakness. MRI negative.    Assessment / Plan / Recommendation Clinical Impression  At this time, pt's current cognitive deficits appear to be at baseline. During this evaluation, pt is oriented to himself only and provides confused comments for location and time (he said July). His speech is fully intelligible and he is able to express his immediate wants/needs. This Probation officer spoke to pt's wife via phone and she confirms the above mentioned as baseline. She also stated that since August 1st of this year, pt has been experiencing a cognitive decline. At this time, ST intervention is not indicated. Information relayed to pt's MD and nurse.     SLP Assessment  SLP Recommendation/Assessment: Patient does not need any further Speech Lanaguage Pathology Services SLP Visit Diagnosis: Cognitive communication deficit (R41.841)    Follow Up Recommendations  None    Frequency and Duration   N/A        SLP Evaluation Cognition  Overall Cognitive Status: History of cognitive impairments - at baseline Arousal/Alertness: Awake/alert Orientation Level: Oriented to person (this is at baseline)       Comprehension  Auditory Comprehension Overall Auditory Comprehension: Appears within functional limits for tasks assessed Visual Recognition/Discrimination Discrimination: Not tested Reading  Comprehension Reading Status: Not tested    Expression Expression Primary Mode of Expression: Verbal Verbal Expression Overall Verbal Expression: Appears within functional limits for tasks assessed   Oral / Motor  Oral Motor/Sensory Function Overall Oral Motor/Sensory Function:  (Chronic left side facial weakness) Motor Speech Overall Motor Speech: Appears within functional limits for tasks assessed   GO                   Benicia Bergevin B. Rutherford Nail M.S., CCC-SLP, [place] Office [phone]  Kason Benak Rutherford Nail 12/10/2019, 1:27 PM

## 2019-12-10 NOTE — Progress Notes (Signed)
*  PRELIMINARY RESULTS* Echocardiogram 2D Echocardiogram has been performed.  Jimmy [name] 12/10/2019, 9:11 AM

## 2019-12-10 NOTE — Evaluation (Signed)
Physical Therapy Evaluation Patient Details Name: Jimmy [name] MRN: [id number] DOB: [birth year] Today's Date: 12/10/2019   History of Present Illness  presented to ER secondary to AMS, decreased responsiveness; admitted for management of acute metabolic encephalopathy, TIA/CVA work up. Noted with elevation in troponin (peak 445), demand ischemia per cardiology.  Clinical Impression  Patient awake upon arrival to room, but notably confused and with marked delay in processing/responsiveness.  Wife at bedside, providing support/encouragement to patient and assisting with history taking.  Baseline hemiparesis (UE > LE) evident.  Tends to list to the R, even in supine; mild torticollis towards R evident.  Currently requiring max/total assist for bed mobility; mod assist for unsupported sitting balance.  Lists to R in sitting; limited/no protective/self-righting balance reactions.  Very high risk for falls.  Anticipate extensive assist (+2) for attempts at transfers or gait at this time. Would benefit from skilled PT to address above deficits and promote optimal return to PLOF.; recommend transition to STR upon discharge from acute hospitalization.     Follow Up Recommendations SNF    Equipment Recommendations       Recommendations for Other Services       Precautions / Restrictions Precautions Precautions: Fall Restrictions Weight Bearing Restrictions: No      Mobility  Bed Mobility Overal bed mobility: Needs Assistance Bed Mobility: Supine to Sit;Sit to Supine     Supine to sit: Max assist Sit to supine: Max assist;Total assist   General bed mobility comments: initially attempting to bring bilat LEs back onto bed; poor dissociation of trunk and extremities; limited ability to actively assist with transfer  Transfers                 General transfer comment: unsafe/unable; mod assist to maintain unsupported sitting balance  Ambulation/Gait             General  Gait Details: unsafe/unable  Stairs            Wheelchair Mobility    Modified Rankin (Stroke Patients Only)       Balance Overall balance assessment: Needs assistance   Sitting balance-Leahy Scale: Poor Sitting balance - Comments: consistent mod assist for unsupported sitting balance, lists to R with absent self-righting reactions.  Maintains briefly with cga/min assist when R UE in active support, unable to release UE support for particiaption with any functional activity                                     Pertinent Vitals/Pain Pain Assessment: No/denies pain    Home Living Family/patient expects to be discharged to:: Private residence Living Arrangements: Spouse/significant other Available Help at Discharge: Personal care attendant (PCA 6 days/week, 1 hour AM, 2 hours PM) Type of Home: House Home Access: Ramped entrance     Home Layout: One level Home Equipment: Wheelchair - [employer] - 2 wheels      Prior Function           Comments: WC level as primary mobility, extensive assist from wife/caregiver for basic transfers, ADLs.  Was actively participating with [place] prior to admission, able to ambulate short distances with RW and +1 assist per wife     Hand Dominance   Dominant Hand: Right    Extremity/Trunk Assessment   Upper Extremity Assessment Upper Extremity Assessment:  (L UE grossly 3-/5 proximatlly, 2+/5 distally (baseline hemiparesis); R UE grossly 4-/5 throughout)  Lower Extremity Assessment Lower Extremity Assessment:  (L LE grossly 3-/5 throughout (Delayed initiation), R LE grossly 4-/5)       Communication   Communication: Expressive difficulties  Cognition Arousal/Alertness: Awake/alert Behavior During Therapy: Flat affect                                   General Comments: oriented to self only; noted delay in responsiveness and task initiation.  Follows limited verbal commands, often requiring  hand-over-hand assist to initiate/complete isolated movements      General Comments      Exercises Other Exercises Other Exercises: Mod assist for positioning and awareness of midline in A//P, M/L planes; absent self-righting reactions noted.  Does maintain static sitting briefly with R UE support, cga/min assist; unable to release for participatino with transfers/activity   Assessment/Plan    PT Assessment Patient needs continued PT services  PT Problem List Decreased strength;Decreased range of motion;Decreased activity tolerance;Decreased balance;Decreased coordination;Decreased cognition;Decreased knowledge of use of DME;Decreased safety awareness;Decreased mobility;Decreased knowledge of precautions       PT Treatment Interventions DME instruction;Functional mobility training;Therapeutic activities;Therapeutic exercise;Balance training;Neuromuscular re-education;Patient/family education;Cognitive remediation    PT Goals (Current goals can be found in the Care Plan section)  Acute Rehab PT Goals Patient Stated Goal: per wife, to return home and resume therapy PT Goal Formulation: With patient/family Time For Goal Achievement: 12/24/19 Potential to Achieve Goals: Fair    Frequency Min 2X/week   Barriers to discharge Decreased caregiver support      Co-evaluation               AM-PAC PT "6 Clicks" Mobility  Outcome Measure Help needed turning from your back to your side while in a flat bed without using bedrails?: A Lot Help needed moving from lying on your back to sitting on the side of a flat bed without using bedrails?: Total Help needed moving to and from a bed to a chair (including a wheelchair)?: Total Help needed standing up from a chair using your arms (e.g., wheelchair or bedside chair)?: Total Help needed to walk in hospital room?: Total Help needed climbing 3-5 steps with a railing? : Total 6 Click Score: 7    End of Session   Activity Tolerance:  Patient limited by fatigue Patient left: in bed;with call bell/phone within reach;with family/visitor present   PT Visit Diagnosis: Muscle weakness (generalized) (M62.81);History of falling (Z91.81);Hemiplegia and hemiparesis Hemiplegia - Right/Left: Left Hemiplegia - dominant/non-dominant: Non-dominant Hemiplegia - caused by: Cerebral infarction    Time: 1143-1209 PT Time Calculation (min) (ACUTE ONLY): 26 min   Charges:   PT Evaluation $PT Eval Moderate Complexity: 1 Mod PT Treatments $Therapeutic Activity: 8-22 mins      Clark Clowdus H. Owens Shark, PT, DPT, NCS 12/10/19, 10:16 PM [phone]

## 2019-12-10 NOTE — Telephone Encounter (Signed)
Routed to PCP as FYI.

## 2019-12-10 NOTE — Progress Notes (Signed)
OT Cancellation Note  Patient Details Name: Jimmy [name] MRN: [id number] DOB: 07-Oct-1928   Cancelled Treatment:    Reason Eval/Treat Not Completed: Other (comment). On 2nd attempt, pt just arrived to 1A. Nursing to complete intake assessment. Unavailable for OT evaluation. Will re-attempt at later date/time as pt is available and medically appropriate.   Jeni Salles, MPH, MS, OTR/L ascom [phone] 12/10/19, 3:12 PM

## 2019-12-11 DIAGNOSIS — R0989 Other specified symptoms and signs involving the circulatory and respiratory systems: Secondary | ICD-10-CM

## 2019-12-11 DIAGNOSIS — I350 Nonrheumatic aortic (valve) stenosis: Secondary | ICD-10-CM

## 2019-12-11 LAB — BASIC METABOLIC PANEL
Anion gap: 7 (ref 5–15)
BUN: 29 mg/dL — ABNORMAL HIGH (ref 8–23)
CO2: 27 mmol/L (ref 22–32)
Calcium: 9.4 mg/dL (ref 8.9–10.3)
Chloride: 103 mmol/L (ref 98–111)
Creatinine, Ser: 0.79 mg/dL (ref 0.61–1.24)
GFR calc Af Amer: >60 mL/min (ref >60)
GFR calc non Af Amer: >60 mL/min (ref >60)
Glucose, Bld: 98 mg/dL (ref 70–99)
Potassium: 3.7 mmol/L (ref 3.5–5.1)
Sodium: 137 mmol/L (ref 135–145)

## 2019-12-11 LAB — CBC
HCT: 40.1 % (ref 39.0–52.0)
Hemoglobin: 13.9 g/dL (ref 13.0–17.0)
MCH: 31.4 pg (ref 26.0–34.0)
MCHC: 34.7 g/dL (ref 30.0–36.0)
MCV: 90.7 fL (ref 80.0–100.0)
Platelets: 159 10*3/uL (ref 150–400)
RBC: 4.42 MIL/uL (ref 4.22–5.81)
RDW: 13.2 % (ref 11.5–15.5)
WBC: 7.2 10*3/uL (ref 4.0–10.5)
nRBC: 0 % (ref 0.0–0.2)

## 2019-12-11 MED ORDER — HYDRALAZINE HCL 50 MG PO TABS
50.0000 mg | ORAL_TABLET | Freq: Four times a day (QID) | ORAL | Status: DC | PRN
  Administered 2019-12-14: 50 mg via ORAL
  Filled 2019-12-11: qty 1

## 2019-12-11 NOTE — Evaluation (Signed)
Clinical/Bedside Swallow Evaluation Patient Details  Name: Jimmy [name] MRN: [id number] Date of Birth: Mar 23, 1929  Today's Date: 12/11/2019 Time: SLP Start Time (ACUTE ONLY): 0840 SLP Stop Time (ACUTE ONLY): 0940 SLP Time Calculation (min) (ACUTE ONLY): 60 min  Past Medical History:  Past Medical History:  Diagnosis Date  . Actinic keratosis   . Basal cell carcinoma 02/15/2016   Right lateral neck infraauricular of SCC scar  . Basal cell carcinoma 02/15/2016   Left chin  . Basal cell carcinoma 03/08/2014   Right of midline forehead  . CAD (coronary artery disease)    a. 04/2001 S/P CABGx 4;  b. 2008 MV:  EF 63% normal perfusion.  . Carotid stenosis    a. 11/2012 Carotid U/S:  RICA 25-42%, LICA 70-62%.  . Chronic kidney disease, stage 3   . CLL (chronic lymphoblastic leukemia) 2009   Stage IV; Dr. Ivor Messier - referred to Dr. Lissa [facility] 07/2013 now on Rituxan (10/2013) --> stage 0 (09/2014)  . Diastolic CHF ([place])    a. 05/7626 Echo: EF 55-65%, mild conc LVH, Gr 1 DD, mild AS, triv AI, mildly dil Ao root (3.5 cm).  . GERD (gastroesophageal reflux disease) 1990s  . History of CVA (cerebrovascular accident) 2015   by MRI - remote L internal capsule  . History of herpes genitalis    valtrex daily  . Hyperlipemia 2002  . Hypertension 2002  . Mass of submandibular region 2015   referred to gen surg after chemo  . Mild aortic stenosis    a. 07/2011 Echo: Mild AS, triv AI.  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 Skin cancer   . Squamous cell carcinoma of skin 10/05/2014   Right mid posterior ear  . Stroke ([place])   . Thrombocytopenia ([place])    outpatient goals Hgb >9, plt >20  . TIA (transient ischemic attack)    04/2016   Past Surgical History:  Past Surgical History:  Procedure Laterality Date  . ANGIOPLASTY  1998  . CATARACT EXTRACTION, BILATERAL     R 1/09, L 8/09  . COLONOSCOPY  11/29/1987   Normal  . COLONOSCOPY  02/07/2003   Hemm. Internal,  focal proctitis, negative biopsy  . COLONOSCOPY  12/12/2004   Internal external hemorrhoids, +proctits, negative biopsy  . CORONARY ANGIOPLASTY  1998  . CORONARY ARTERY BYPASS GRAFT  04/23/2001   x4, Dr. Pia Mau  . ESOPHAGOGASTRODUODENOSCOPY  11/29/1987   Gastritis  . ETT  12/10/2006   Persantine myoview nml  . HEMORROIDECTOMY  1954   Fissure repair, [place]  . HEPATIC ARTERY ANGIOPLASTY  1954   [place], [place]  . KIDNEY STONE SURGERY  1977   Dr Redmond Baseman  . LITHOTRIPSY  1990s   Multiple  . US ECHOCARDIOGRAPHY  07/2011   nl sys fxn, EF 55-60%, grade 1 diast dysfunction, mild AS, mildly elevated PA pressure   HPI:  Pt is a 86 y.o. male with a known history of Mixed Alzheimer's and vascular Dementia, history of hemorrhagic CVA with left-sided weakness and Dysphagia ~3 years ago(dysphagia documented in 04/2018), and multiple medical issues including CAD s/p CABG x4, carotid stenosis, CKD 3, history CLL, chronic diastolic CHF, hypertension, hyperlipidemia who presents to the ED w/ weakness and slurred speech.  MRI: "Moderately advanced age-related cerebral atrophy with chronic disease"; CXR on 12/04/2019: negative for any acute issues.    Assessment / Plan / Recommendation Clinical Impression  Pt appears to present w/ oropharyngeal phase dysphagia impacted  by declined Cognitive awareness(baseline Dementia) and baseline oropharyngeal phase dysphagia noted in 04/2018, 07/2018. These issues increase risk for aspiration thus Pulmonary decline. Initial BSE in 04/2018 rec'd dysphagia diet w/ Nectar liquids d/t noted aspiration. A MBSS 07/2018 noted "Moderate oropharyngeal phase dysphaiga.... the patient remains at risk for prandial aspiration however risk is greatly reduced with consistent head turn Left for all swallows. The patient is able to state this rule and reports that he uses the strategy for all swallows. Recommend mechanical soft diet with thin liquids, head turn Left for all swallows".  Cognitive-linguistic issues were noted 07/2018 also. During this evaluation, pt was able to state he "needed" to turn his head Left when swallowing but did NOT follow through w/ this action consistently despite Mod+ verbal/visual cues from SLP standing on his Left side. Pt exhibited overt clinical s/s of aspiration(immediate and delayed Coughing) w/ trials of thin liquids via Cup. Pt then consumed trials of Nectar liquids via Cup presented w/ no overt clinical s/s of aspiration noted; no decline in phonations, no cough, and no decline in respiratory status during/post trials. Same was noted w/ purees, soft solids. During the oral phase, decreased oral coordination and bolus management noted requiring increased oral phase Time for bolus mastication, A-P transfer, and overall oral clearing w/ increased textured trials. Encouraged pt to use strategies of alternating foods, foods and nectar liquids, and lingual sweeping to clear Left oral cavity -- noted diffuse, min residue on Left as well as min labial residue/spillage on Left. Pt exhibited decreased sensation and awareness to spillage. Practiced precautions and strategies w/ pt for hopeful carry-over.  Recommenda Dysphagia level 3(mech soft) w/ Nectar liquids via Cup, no Straws; aspiration precautions including Left oral clearing; reduce Distractions during meals. Pills Crushed in Puree for safer swallowing. Support w/ tray setup and Monitoring at meals for follow through w/ precautions/strategies to decrease risk for aspiration. Will f/u w/ Wife. MD/NSG updated.   SLP Visit Diagnosis: Dysphagia, oropharyngeal phase (R13.12)    Aspiration Risk  Moderate aspiration risk;Risk for inadequate nutrition/hydration (reduced w/ modified diet; precautions)    Diet Recommendation  Dysphagia level 3 (mech soft w/ gravies); Nectar consistency liquids VIA CUP. No Straws. General aspiration precautions including tray setup and monitoring during meals for follow through w/  aspiration precautions and oral clearing on Left side of mouth  Medication Administration: Whole meds with puree (for cohesive swallowing)    Other  Recommendations Recommended Consults:  (Dietician f/u) Oral Care Recommendations: Oral care BID;Oral care before and after PO;Staff/trained caregiver to provide oral care Other Recommendations: Order thickener from pharmacy;Prohibited food (jello, ice cream, thin soups);Remove water pitcher;Have oral suction available   Follow up Recommendations Skilled Nursing facility (TBD)      Frequency and Duration min 2x/week  2 weeks       Prognosis Prognosis for Safe Diet Advancement: Fair Barriers to Reach Goals: Cognitive deficits;Language deficits;Time post onset;Severity of deficits Barriers/Prognosis Comment: baseline Cognitive-linguistic deficits      Swallow Study   General Date of Onset: 12/09/19 HPI: Pt is a 86 y.o. male with a known history of Mixed Alzheimer's and vascular Dementia, history of hemorrhagic CVA with left-sided weakness and Dysphagia ~3 years ago(dysphagia documented in 04/2018), and multiple medical issues including CAD s/p CABG x4, carotid stenosis, CKD 3, history CLL, chronic diastolic CHF, hypertension, hyperlipidemia who presents to the ED w/ weakness and slurred speech.  MRI: "Moderately advanced age-related cerebral atrophy with chronic disease"; CXR on 12/04/2019: negative for any acute  issues.  Type of Study: Bedside Swallow Evaluation Previous Swallow Assessment: MBSS 07/2019; pt was seen for both Cognitive-linguistic and BSE 04/2018 [facility] Diet Prior to this Study: Regular;Thin liquids (rec'd Dys. 3 w/ Nectar liquids 04/2018) Temperature Spikes Noted: No (wbc 7.2) Respiratory Status: Room air History of Recent Intubation: No Behavior/Cognition: Alert;Cooperative;Pleasant mood;Confused;Distractible;Requires cueing (follow through inconsistent) Oral Cavity Assessment: Dry Oral Care Completed by SLP: Yes Oral Cavity -  Dentition: Adequate natural dentition Vision: Functional for self-feeding Self-Feeding Abilities: Able to feed self;Needs assist;Needs set up (LUE weakness) Patient Positioning: Upright in bed (needed positioning assistance) Baseline Vocal Quality: Normal (adequate) Volitional Cough: Strong Volitional Swallow: Able to elicit    Oral/Motor/Sensory Function Overall Oral Motor/Sensory Function: Moderate impairment Facial ROM: Reduced left Facial Symmetry: Abnormal symmetry left Facial Strength: Reduced left Facial Sensation: Reduced left Lingual ROM: Within Functional Limits Lingual Symmetry: Within Functional Limits Lingual Strength: Reduced (min) Lingual Sensation: Reduced (min) Velum: Within Functional Limits Mandible: Within Functional Limits   Ice Chips Ice chips: Impaired Presentation: Spoon (fed; 3 trials) Oral Phase Impairments:  (adequate) Pharyngeal Phase Impairments: Throat Clearing - Delayed (x1/3)   Thin Liquid Thin Liquid: Impaired Presentation: Cup;Self Fed (4 trials) Oral Phase Impairments:  (adequate) Oral Phase Functional Implications:  (adequate) Pharyngeal  Phase Impairments: Cough - Immediate;Cough - Delayed (x3/4 trials) Other Comments: attempted aspiration precautions and rec'd swallowing strategy of Head Turn Left    Nectar Thick Nectar Thick Liquid: Within functional limits Presentation: Cup;Self Fed (~3 ozs) Other Comments: no Head Turn stragegy indicated   Honey Thick Honey Thick Liquid: Not tested   Puree Puree: Within functional limits Presentation: Self Fed;Spoon (~4 ozs of oatmeal)   Solid     Solid: Impaired Presentation: Self Fed;Spoon (10 trials of pancake) Oral Phase Impairments: Reduced lingual movement/coordination Oral Phase Functional Implications: Left anterior spillage;Prolonged oral transit;Oral residue (min) Pharyngeal Phase Impairments:  (none) Other Comments: decreased sensation to the labial residue        Orinda Kenner,  MS, [employer] Speech Language Pathologist Rehab Services [phone] Ayda Tancredi 12/11/2019,10:48 AM

## 2019-12-11 NOTE — Progress Notes (Signed)
Progress Note  Patient Name: Jimmy [name] Date of Encounter: 12/11/2019  Primary Cardiologist: Rockey Situ  Subjective   Echo demonstrated preserved LVSF with no RWMA and mild AS. No chest pain or dyspnea.   Inpatient Medications    Scheduled Meds: .  stroke: mapping our early stages of recovery book   Does not apply Once  . allopurinol  300 mg Oral QHS  . aspirin  300 mg Rectal Daily   Or  . aspirin EC  325 mg Oral Daily  . carvedilol  3.125 mg Oral BID WC  . donepezil  5 mg Oral QHS  . enoxaparin (LOVENOX) injection  40 mg Subcutaneous Q24H  . simvastatin  20 mg Oral q1800   Continuous Infusions:  PRN Meds: acetaminophen **OR** acetaminophen (TYLENOL) oral liquid 160 mg/5 mL **OR** acetaminophen, hydrALAZINE, morphine injection, nitroGLYCERIN, senna-docusate   Vital Signs    Vitals:   12/10/19 1512 12/10/19 1937 12/10/19 2330 12/11/19 0311  BP: (!) 147/75 (!) 123/50 139/61 (!) 183/75  Pulse: 70 61 (!) 58 (!) 57  Resp: 16 16 17 16   Temp: 97.9 F (36.6 C) 97.8 F (36.6 C) (!) 97.5 F (36.4 C) 97.7 F (36.5 C)  TempSrc:  Oral Oral Oral  SpO2: 100% 97% 99% 100%  Weight:      Height:        Intake/Output Summary (Last 24 hours) at 12/11/2019 0741 Last data filed at 12/11/2019 0315 Gross per 24 hour  Intake --  Output 375 ml  Net -375 ml   Filed Weights   12/09/19 1922  Weight: 68 kg    Telemetry    SR, uppers 40s to 70s bpm with rates generally in the 60s bpm - Personally Reviewed  ECG    No new tracings - Personally Reviewed  Physical Exam   GEN: Elderly and frail appearing. No acute distress.   Neck: No JVD. Cardiac: RRR, no murmurs, rubs, or gallops.  Respiratory: Clear to auscultation bilaterally.  GI: Soft, nontender, non-distended.   MS: No edema; No deformity. Neuro:  Alert and oriented x 1; Nonfocal.  Psych: Normal affect.  Labs    Chemistry Recent Labs  Lab 12/04/19 2130 12/09/19 1959 12/10/19 1131  NA 140 140 140  K 4.4  3.8 3.4*  CL 106 100 104  CO2 25 28 24   GLUCOSE 112* 145* 141*  BUN 25* 39* 37*  CREATININE 1.13 1.37* 1.03  CALCIUM 9.9 10.1 9.2  PROT 6.7 7.5  --   ALBUMIN 3.9 4.3  --   AST 22 28  --   ALT 21 27  --   ALKPHOS 108 110  --   BILITOT 1.1 1.2  --   GFRNONAA 58* 46* >60  GFRAA >60 53* >60  ANIONGAP 9 12 12      Hematology Recent Labs  Lab 12/04/19 2130 12/09/19 1959 12/10/19 1131  WBC 6.6 10.8* 6.1  RBC 4.76 4.86 4.13*  HGB 14.4 15.3 13.2  HCT 44.7 44.6 36.1*  MCV 93.9 91.8 87.4  MCH 30.3 31.5 32.0  MCHC 32.2 34.3 36.6*  RDW 13.3 13.5 13.2  PLT 161 211 149*    Cardiac EnzymesNo results for input(s): TROPONINI in the last 168 hours. No results for input(s): TROPIPOC in the last 168 hours.   BNPNo results for input(s): BNP, PROBNP in the last 168 hours.   DDimer No results for input(s): DDIMER in the last 168 hours.   Radiology    CT Head Wo Contrast  Result Date: 12/09/2019 IMPRESSION: Atrophy, chronic microvascular disease. No acute intracranial abnormality. Electronically Signed   By: Rolm Baptise M.D.   On: 12/09/2019 20:20   MR BRAIN WO CONTRAST  Result Date: 12/10/2019 IMPRESSION: 1. No acute intracranial abnormality. 2. Moderately advanced age-related cerebral atrophy with chronic small vessel ischemic disease with multiple remote lacunar infarcts involving the bilateral basal ganglia, thalami, and pons. Electronically Signed   By: Jeannine Boga M.D.   On: 12/10/2019 00:08   US Carotid Bilateral (at [place] and [facility] only)  Result Date: 12/10/2019 IMPRESSION: Right: Heterogeneous and partially calcified plaque at the right carotid bifurcation, with discordant results regarding degree of stenosis by established duplex criteria. Peak velocity suggests 50%-69% stenosis, with the ICA/ CCA ratio suggesting a lesser degree of stenosis. If establishing a more accurate degree of stenosis is required, cerebral angiogram should be considered, or as a second best test,  CTA. Left: Color duplex indicates moderate heterogeneous and calcified plaque, with no hemodynamically significant stenosis by duplex criteria in the extracranial cerebrovascular circulation. Signed, Dulcy Fanny. Dellia Nims, RPVI [facility] Radiology Electronically Signed   By: Corrie Mckusick D.O.   On: 12/10/2019 11:35    Cardiac Studies   2D echo 12/10/2019: 1. Left ventricular ejection fraction, by estimation, is 60 to 65%. The  left ventricle has normal function. The left ventricle has no regional  wall motion abnormalities. Left ventricular diastolic parameters are  consistent with Grade I diastolic  dysfunction (impaired relaxation).  2. Right ventricular systolic function is normal. The right ventricular  size is normal.  3. Mild aortic valve stenosis.   Patient Profile     86 y.o. 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   Assessment & Plan    1. Elevated troponin: -Likely in the setting of supply demand ischemia secondary to labile BP with known underlying CAD -Minimally elevated and not consistent with ACS -No indication for continued troponin cycling  -Asymptomatic  -Echo with preserved LVSF and RWMA -No indication for heparin gtt or inpatient ischemic evaluation   2. CAD s/p CABG: -Continue ASA, Coreg, simvastatin  -As above   3. Dementia: -Cognitive function appears to be declining over the past year -Requires nurse  aides at home -Follow up with neurology   4. TIA/CVA: -Management per IM and neurology   5. Labile hypertension: -Elevated this morning, though overall has been reasonably controlled over the past 24 hours -Coreg -Lisinopril initially held in the setting of AKI which has improved  -Continue to monitor   6. AKI: -Improved  7. HLD: -LDL at goal -Simvastatin   8. Diastolic dysfunction: -Possibly mildly dehydrated upon presentation with AKI -He does not appear volume up -No indication for inpatient IV diuresis or standing diuretic therapy at this time  9. Aortic stenosis: -Mild on echo this admission   For questions or updates, please contact [facility] Please consult [email or web address] for contact info under Cardiology/STEMI.    Signed, Christell Faith, PA-C [place] Pager: [phone] 12/11/2019, 7:41 AM

## 2019-12-11 NOTE — Care Plan (Signed)
Pt is alert and oriented to self and situation. Pt states he is here at the hospital because his head isnt right. Pt able to feed self. Pills whole in applesauce but found does better when they are crushed. Pt naps a lot per wife and his afternoon naps are long.  He is an early bird per wife. Pt has condom cath on at this time. Pt is forgetful and needs coaching at times.

## 2019-12-11 NOTE — Progress Notes (Addendum)
PROGRESS NOTE    Jimmy [name]  ENI:[id number] DOB: [birth year] DOA: 12/09/2019 PCP: Ria Bush, MD  Assessment & Plan:   Active Problems:   TIA (transient ischemic attack)   Encephalopathy   Acute encephalopathy: etiology unclear, possibly worsening dementia vs infection. CT brain shows atrophy & no acute intracranial findings. MRI shows no acute intracranial findings, but multiple remote lacunar infarcts of b/l basal ganglia, thalami & pons.  Urine cx pending. Blood cx NGTD. Continue w/ neuro checks. Continue on aspirin. PT/OT consulted. EEG ordered but are not done over the weekends here.   Elevated troponins: likely secondary to demand ischemia as per cardio. NSTEMI/ACS  r/o.  Echo shows EF 61-44%, grade I diastolic function, no wall motion abnormalities, & mild aortic stensosi. Continue on aspirin. Continue on tele. Cardio following and recs apprec   HTN: uncontrolled. Continue on carvedilol as per cardio. Continue to hold lisinopril secondary to AKI   HLD: continue on home dose of statin  AKI: resolved   Dementia: will continue on home dose of donepezil   Gout: will continue on home dose of allopurinol   Thrombocytopenia: etiology unclear. Will continue to monitor    DVT prophylaxis: lovenox Code Status: partial  Family Communication: discussed pt's care w/ pt's wife, [name], and answered her questions  Disposition Plan: depends on PT/OT recs    Consultants:   Cardio    Procedures:    Antimicrobials:    Subjective: Pt is confused and unable to answer questions appropriately.   Objective: Vitals:   12/10/19 1512 12/10/19 1937 12/10/19 2330 12/11/19 0311  BP: (!) 147/75 (!) 123/50 139/61 (!) 183/75  Pulse: 70 61 (!) 58 (!) 57  Resp: 16 16 17 16   Temp: 97.9 F (36.6 C) 97.8 F (36.6 C) (!) 97.5 F (36.4 C) 97.7 F (36.5 C)  TempSrc:  Oral Oral Oral  SpO2: 100% 97% 99% 100%  Weight:      Height:        Intake/Output Summary (Last 24 hours)  at 12/11/2019 0718 Last data filed at 12/11/2019 0315 Gross per 24 hour  Intake --  Output 375 ml  Net -375 ml   Filed Weights   12/09/19 1922  Weight: 68 kg    Examination:  General exam: Appears calm and comfortable. Frail appearing  Respiratory system: clear breath sounds b/l. No rales  Cardiovascular system: S1 & S2 +. No  rubs, gallops or clicks.  Gastrointestinal system: Abdomen is nondistended, soft and nontender. Hypoactive bowel sounds heard. Central nervous system: Alert and awake. Oriented to person only. Moves all 4 extremities  Psychiatry: Judgement and insight appear abnormal. Flat mood and affect    Data Reviewed: I have personally reviewed following labs and imaging studies  CBC: Recent Labs  Lab 12/04/19 2130 12/09/19 1959 12/10/19 1131  WBC 6.6 10.8* 6.1  NEUTROABS 5.0 8.1*  --   HGB 14.4 15.3 13.2  HCT 44.7 44.6 36.1*  MCV 93.9 91.8 87.4  PLT 161 211 315*   Basic Metabolic Panel: Recent Labs  Lab 12/04/19 2130 12/09/19 1959 12/10/19 1131  NA 140 140 140  K 4.4 3.8 3.4*  CL 106 100 104  CO2 25 28 24   GLUCOSE 112* 145* 141*  BUN 25* 39* 37*  CREATININE 1.13 1.37* 1.03  CALCIUM 9.9 10.1 9.2  MG  --   --  2.1   GFR: Estimated Creatinine Clearance: 47.7 mL/min (by C-G formula based on SCr of 1.03 mg/dL). Liver Function Tests: Recent Labs  Lab 12/04/19 2130 12/09/19 1959  AST 22 28  ALT 21 27  ALKPHOS 108 110  BILITOT 1.1 1.2  PROT 6.7 7.5  ALBUMIN 3.9 4.3   No results for input(s): LIPASE, AMYLASE in the last 168 hours. No results for input(s): AMMONIA in the last 168 hours. Coagulation Profile: Recent Labs  Lab 12/04/19 2130  INR 1.0   Cardiac Enzymes: No results for input(s): CKTOTAL, CKMB, CKMBINDEX, TROPONINI in the last 168 hours. BNP (last 3 results) No results for input(s): PROBNP in the last 8760 hours. HbA1C: Recent Labs    12/10/19 0403  HGBA1C 5.4   CBG: No results for input(s): GLUCAP in the last 168  hours. Lipid Profile: Recent Labs    12/10/19 0403  CHOL 106  HDL 35*  LDLCALC 48  TRIG 114  CHOLHDL 3.0   Thyroid Function Tests: No results for input(s): TSH, T4TOTAL, FREET4, T3FREE, THYROIDAB in the last 72 hours. Anemia Panel: No results for input(s): VITAMINB12, FOLATE, FERRITIN, TIBC, IRON, RETICCTPCT in the last 72 hours. Sepsis Labs: No results for input(s): PROCALCITON, LATICACIDVEN in the last 168 hours.  Recent Results (from the past 240 hour(s))  SARS Coronavirus 2 by RT PCR (hospital order, performed in [facility] hospital lab) Nasopharyngeal Nasopharyngeal Swab     Status: None   Collection Time: 12/04/19  9:49 PM   Specimen: Nasopharyngeal Swab  Result Value Ref Range Status   SARS Coronavirus 2 NEGATIVE NEGATIVE Final    Comment: (NOTE) SARS-CoV-2 target nucleic acids are NOT DETECTED.  The SARS-CoV-2 RNA is generally detectable in upper and lower respiratory specimens during the acute phase of infection. The lowest concentration of SARS-CoV-2 viral copies this assay can detect is 250 copies / mL. A negative result does not preclude SARS-CoV-2 infection and should not be used as the sole basis for treatment or other patient management decisions.  A negative result may occur with improper specimen collection / handling, submission of specimen other than nasopharyngeal swab, presence of viral mutation(s) within the areas targeted by this assay, and inadequate number of viral copies (<250 copies / mL). A negative result must be combined with clinical observations, patient history, and epidemiological information.  Fact Sheet for Patients:   StrictlyIdeas.no  Fact Sheet for Healthcare Providers: BankingDealers.co.za  This test is not yet approved or  cleared by the [place] FDA and has been authorized for detection and/or diagnosis of SARS-CoV-2 by FDA under an Emergency Use Authorization (EUA).  This EUA  will remain in effect (meaning this test can be used) for the duration of the COVID-19 declaration under Section 564(b)(1) of the Act, 21 U.S.C. section 360bbb-3(b)(1), unless the authorization is terminated or revoked sooner.  Performed at [facility], [place] [address] [place], [place] [address]   SARS Coronavirus 2 by RT PCR (hospital order, performed in [facility] hospital lab) Nasopharyngeal Nasopharyngeal Swab     Status: None   Collection Time: 12/10/19  4:03 AM   Specimen: Nasopharyngeal Swab  Result Value Ref Range Status   SARS Coronavirus 2 NEGATIVE NEGATIVE Final    Comment: (NOTE) SARS-CoV-2 target nucleic acids are NOT DETECTED.  The SARS-CoV-2 RNA is generally detectable in upper and lower respiratory specimens during the acute phase of infection. The lowest concentration of SARS-CoV-2 viral copies this assay can detect is 250 copies / mL. A negative result does not preclude SARS-CoV-2 infection and should not be used as the sole basis for treatment or other patient management decisions.  A negative result may occur with improper specimen collection / handling, submission of specimen other than nasopharyngeal swab, presence of viral mutation(s) within the areas targeted by this assay, and inadequate number of viral copies (<250 copies / mL). A negative result must be combined with clinical observations, patient history, and epidemiological information.  Fact Sheet for Patients:   StrictlyIdeas.no  Fact Sheet for Healthcare Providers: BankingDealers.co.za  This test is not yet approved or  cleared by the [place] FDA and has been authorized for detection and/or diagnosis of SARS-CoV-2 by FDA under an Emergency Use Authorization (EUA).  This EUA will remain in effect (meaning this test can be used) for the duration of the COVID-19 declaration under Section 564(b)(1) of the Act, 21 U.S.C. section  360bbb-3(b)(1), unless the authorization is terminated or revoked sooner.  Performed at [facility], [address] [name], [place] [address]          Radiology Studies: CT Head Wo Contrast  Result Date: 12/09/2019 CLINICAL DATA:  Mental status changes EXAM: CT HEAD WITHOUT CONTRAST TECHNIQUE: Contiguous axial images were obtained from the base of the skull through the vertex without intravenous contrast. COMPARISON:  12/04/2019 FINDINGS: Brain: There is atrophy and chronic small vessel disease changes. Old bilateral basal ganglia lacunar infarcts. No acute intracranial abnormality. Specifically, no hemorrhage, hydrocephalus, mass lesion, acute infarction, or significant intracranial injury. Vascular: No hyperdense vessel or unexpected calcification. Skull: No acute calvarial abnormality. Sinuses/Orbits: Visualized paranasal sinuses and mastoids clear. Orbital soft tissues unremarkable. Other: None IMPRESSION: Atrophy, chronic microvascular disease. No acute intracranial abnormality. Electronically Signed   By: Rolm Baptise M.D.   On: 12/09/2019 20:20   MR BRAIN WO CONTRAST  Result Date: 12/10/2019 CLINICAL DATA:  Initial evaluation for acute altered mental status. EXAM: MRI HEAD WITHOUT CONTRAST TECHNIQUE: Multiplanar, multiecho pulse sequences of the brain and surrounding structures were obtained without intravenous contrast. COMPARISON:  Prior CT from earlier same day. FINDINGS: Brain: Examination moderately degraded by motion artifact. Diffuse prominence of the CSF containing spaces compatible with moderately advanced age-related cerebral atrophy. Patchy and confluent T2/FLAIR hyperintensity within the periventricular and deep white matter both cerebral hemispheres most consistent with chronic small vessel ischemic disease, moderate in nature. Mild patchy involvement of the pons. Multiple scattered remote lacunar infarcts present about the bilateral basal ganglia, thalami, and  pons. Associated chronic hemosiderin staining noted about a chronic lacunar infarct at the right lentiform nucleus. Probable additional tiny chronic remote right parietal infarct (series 15, image 42). No abnormal foci of restricted diffusion to suggest acute or subacute ischemia. Gray-white matter differentiation maintained. No evidence for acute intracranial hemorrhage. No mass lesion, midline shift or mass effect. Diffuse ventricular prominence related global parenchymal volume loss without hydrocephalus. No extra-axial fluid collection. Pituitary gland suprasellar region within normal limits. Midline structures intact. Vascular: Major intracranial vascular flow voids are grossly maintained at the skull base. Skull and upper cervical spine: Craniocervical junction within normal limits. Bone marrow signal intensity normal. No scalp soft tissue abnormality. Sinuses/Orbits: Patient status post bilateral ocular lens replacement. Paranasal sinuses are largely clear. Small right mastoid effusion noted, of doubtful significance. Other: None. IMPRESSION: 1. No acute intracranial abnormality. 2. Moderately advanced age-related cerebral atrophy with chronic small vessel ischemic disease with multiple remote lacunar infarcts involving the bilateral basal ganglia, thalami, and pons. Electronically Signed   By: Jeannine Boga M.D.   On: 12/10/2019 00:08   US Carotid Bilateral (at [facility] and [facility] only)  Result Date: 12/10/2019 CLINICAL  DATA:  86 year old male with TIA EXAM: BILATERAL CAROTID DUPLEX ULTRASOUND TECHNIQUE: Pearline Cables scale imaging, color Doppler and duplex ultrasound were performed of bilateral carotid and vertebral arteries in the neck. COMPARISON:  None. FINDINGS: Criteria: Quantification of carotid stenosis is based on velocity parameters that correlate the residual internal carotid diameter with NASCET-based stenosis levels, using the diameter of the distal internal carotid lumen as the denominator for  stenosis measurement. The following velocity measurements were obtained: RIGHT ICA:  Systolic 093 cm/sec, Diastolic 28 cm/sec CCA:  94 cm/sec SYSTOLIC ICA/CCA RATIO:  1.6 ECA:  354 cm/sec LEFT ICA:  Systolic 818 cm/sec, Diastolic 21 cm/sec CCA:  58 cm/sec SYSTOLIC ICA/CCA RATIO:  1.8 ECA:  91 cm/sec Right Brachial SBP: Not acquired Left Brachial SBP: Not acquired RIGHT CAROTID ARTERY: No significant calcifications of the right common carotid artery. Intermediate waveform maintained. Moderate heterogeneous and partially calcified plaque at the right carotid bifurcation. No significant lumen shadowing. Low resistance waveform of the right ICA. No significant tortuosity. RIGHT VERTEBRAL ARTERY: Antegrade flow with low resistance waveform. LEFT CAROTID ARTERY: No significant calcifications of the left common carotid artery. Intermediate waveform maintained. Moderate heterogeneous and partially calcified plaque at the left carotid bifurcation. No significant lumen shadowing. Low resistance waveform of the left ICA. No significant tortuosity. LEFT VERTEBRAL ARTERY:  Antegrade flow with low resistance waveform. IMPRESSION: Right: Heterogeneous and partially calcified plaque at the right carotid bifurcation, with discordant results regarding degree of stenosis by established duplex criteria. Peak velocity suggests 50%-69% stenosis, with the ICA/ CCA ratio suggesting a lesser degree of stenosis. If establishing a more accurate degree of stenosis is required, cerebral angiogram should be considered, or as a second best test, CTA. Left: Color duplex indicates moderate heterogeneous and calcified plaque, with no hemodynamically significant stenosis by duplex criteria in the extracranial cerebrovascular circulation. Signed, Dulcy Fanny. Dellia Nims, RPVI [facility] Electronically Signed   By: Corrie Mckusick D.O.   On: 12/10/2019 11:35   ECHOCARDIOGRAM COMPLETE  Result  Date: 12/10/2019    ECHOCARDIOGRAM REPORT   Patient Name:   BASILIO MEADOW [facility] Date of Exam: 12/10/2019 Medical Rec #:  [id number]        Height:       69.0 in Accession #:    [id number]       Weight:       150.0 lb Date of Birth:  [birth year]        BSA:          1.828 m Patient Age:    7 years         BP:           173/76 mmHg Patient Gender: M                HR:           89 bpm. Exam Location:  [facility] Procedure: 2D Echo, Color Doppler and Cardiac Doppler Indications:     G45.9 TIA  History:         Patient has prior history of Echocardiogram examinations. CHF,                  CAD, Stroke; Risk Factors:Hypertension and Dyslipidemia.  Sonographer:     Charmayne Sheer RDCS (AE) Referring Phys:  [id number] Arvella Merles MANSY Diagnosing Phys: Ida Rogue MD  Sonographer Comments: Suboptimal apical window and suboptimal subcostal window. Image acquisition challenging due to uncooperative patient and Image acquisition challenging due to respiratory motion.  IMPRESSIONS  1. Left ventricular ejection fraction, by estimation, is 60 to 65%. The left ventricle has normal function. The left ventricle has no regional wall motion abnormalities. Left ventricular diastolic parameters are consistent with Grade I diastolic dysfunction (impaired relaxation).  2. Right ventricular systolic function is normal. The right ventricular size is normal.  3. Mild aortic valve stenosis. FINDINGS  Left Ventricle: Left ventricular ejection fraction, by estimation, is 60 to 65%. The left ventricle has normal function. The left ventricle has no regional wall motion abnormalities. The left ventricular internal cavity size was normal in size. There is  no left ventricular hypertrophy. Left ventricular diastolic parameters are consistent with Grade I diastolic dysfunction (impaired relaxation). Right Ventricle: The right ventricular size is normal. No increase in right ventricular wall thickness. Right ventricular systolic function is normal. Left Atrium: Left  atrial size was normal in size. Right Atrium: Right atrial size was normal in size. Pericardium: There is no evidence of pericardial effusion. Mitral Valve: The mitral valve is normal in structure. No evidence of mitral valve regurgitation. No evidence of mitral valve stenosis. MV peak gradient, 8.8 mmHg. The mean mitral valve gradient is 2.0 mmHg. Tricuspid Valve: The tricuspid valve is normal in structure. Tricuspid valve regurgitation is not demonstrated. No evidence of tricuspid stenosis. Aortic Valve: The aortic valve is normal in structure. Aortic valve regurgitation is not visualized. Mild aortic stenosis is present. Aortic valve mean gradient measures 7.0 mmHg. Aortic valve peak gradient measures 14.3 mmHg. Aortic valve area, by VTI measures 1.48 cm. Pulmonic Valve: The pulmonic valve was normal in structure. Pulmonic valve regurgitation is not visualized. No evidence of pulmonic stenosis. Aorta: The aortic root is normal in size and structure. Venous: The inferior vena cava is normal in size with greater than 50% respiratory variability, suggesting right atrial pressure of 3 mmHg. IAS/Shunts: No atrial level shunt detected by color flow Doppler.  LEFT VENTRICLE PLAX 2D LVIDd:         3.26 cm  Diastology LVIDs:         2.09 cm  LV e' medial:    3.81 cm/s LV PW:         1.10 cm  LV E/e' medial:  15.9 LV IVS:        0.98 cm  LV e' lateral:   8.16 cm/s LVOT diam:     2.10 cm  LV E/e' lateral: 7.4 LV SV:         61 LV SV Index:   33 LVOT Area:     3.46 cm  LEFT ATRIUM           Index LA diam:      2.40 cm 1.31 cm/m LA Vol (A4C): 25.4 ml 13.89 ml/m  AORTIC VALVE                    PULMONIC VALVE AV Area (Vmax):    1.57 cm     PV Vmax:       1.15 m/s AV Area (Vmean):   1.55 cm     PV Vmean:      76.600 cm/s AV Area (VTI):     1.48 cm     PV VTI:        0.204 m AV Vmax:           189.00 cm/s  PV Peak grad:  5.3 mmHg AV Vmean:          130.000 cm/s PV Mean grad:  3.0  mmHg AV VTI:            0.411 m AV Peak  Grad:      14.3 mmHg AV Mean Grad:      7.0 mmHg LVOT Vmax:         85.70 cm/s LVOT Vmean:        58.300 cm/s LVOT VTI:          0.176 m LVOT/AV VTI ratio: 0.43  AORTA Ao Root diam: 3.10 cm MITRAL VALVE MV Area (PHT): 4.06 cm     SHUNTS MV Peak grad:  8.8 mmHg     Systemic VTI:  0.18 m MV Mean grad:  2.0 mmHg     Systemic Diam: 2.10 cm MV Vmax:       1.48 m/s MV Vmean:      70.8 cm/s MV Decel Time: 187 msec MV E velocity: 60.60 cm/s MV A velocity: 114.00 cm/s MV E/A ratio:  0.53 Ida Rogue MD Electronically signed by Ida Rogue MD Signature Date/Time: 12/10/2019/5:12:41 PM    Final         Scheduled Meds: .  stroke: mapping our early stages of recovery book   Does not apply Once  . allopurinol  300 mg Oral QHS  . aspirin  300 mg Rectal Daily   Or  . aspirin EC  325 mg Oral Daily  . carvedilol  3.125 mg Oral BID WC  . donepezil  5 mg Oral QHS  . enoxaparin (LOVENOX) injection  40 mg Subcutaneous Q24H  . simvastatin  20 mg Oral q1800   Continuous Infusions:    LOS: 1 day    Time spent: 30 mins    Wyvonnia Dusky, MD [facility] Pager [phone]  If 7PM-7AM, please contact night-coverage [email or web address] 12/11/2019, 7:18 AM

## 2019-12-11 NOTE — Evaluation (Signed)
Occupational Therapy Evaluation Patient Details Name: Jimmy [name] MRN: [id number] DOB: July 02, 1928 Today's Date: 12/11/2019    History of Present Illness Pt. is an 86 y.o. male who was admitted to [facility] with AMS, decreased responsiveness. Pt. was admitted for management of acute metabolic encephalopathy, TIA/CVA work up. Noted with elevation in troponin (peak 445), demand ischemia per cardiology.   Clinical Impression   Pt. Presents with weakness, limited activity tolerance, limted LUE functioning, and limited functional mobility which hinders his ability to complete basic ADL and IADL functioning.  Pt. Resides at home with his wife. Pt. Required assist with ADL, and IADL tasks from his wife. Pt. Required assist with meal preparation, medication management, and performing home management, and household tasks. Pt. was able to perform hand to mouth patterns using the RUE for self-feeding. Pt. Required cues to smaller bites, and for wiping the left side of his mouth.  Pt. required minA to perform grooming, oral hygiene with set-up. Pt. will benefit from OT services for ADL training, A/E training, and pt./caregiver Education about home modification, and DME. Pt. Will benefit from follow-up OT services at the next level of care following discharge.    Follow Up Recommendations  SNF    Equipment Recommendations  None recommended by OT    Recommendations for Other Services       Precautions / Restrictions Precautions Precautions: Fall Restrictions Weight Bearing Restrictions: No      Mobility Bed Mobility           Sit to supine: Max assist;Total assist      Transfers                 General transfer comment: Deferred    Balance                                           ADL either performed or assessed with clinical judgement   ADL Overall ADL's : Needs assistance/impaired Eating/Feeding: Set up;Supervision/ safety;Bed level   Grooming: Minimal  assistance;Bed level   Upper Body Bathing: Maximal assistance   Lower Body Bathing: Maximal assistance   Upper Body Dressing : Maximal assistance   Lower Body Dressing: Maximal assistance                       Vision Baseline Vision/History: Wears glasses Wears Glasses: At all times Patient Visual Report: No change from baseline       Perception     Praxis      Pertinent Vitals/Pain Pain Assessment: No/denies pain     Hand Dominance Right   Extremity/Trunk Assessment Upper Extremity Assessment Upper Extremity Assessment: Generalized weakness;LUE deficits/detail;RUE deficits/detail RUE Deficits / Details: generalized weakness LUE Deficits / Details: 2+/5 overall, 3rd, 4th,a nd 5th digt flexion contractures           Communication Communication Communication: Expressive difficulties   Cognition Arousal/Alertness: Awake/alert Behavior During Therapy: Flat affect Overall Cognitive Status: History of cognitive impairments - at baseline                                     General Comments       Exercises     Shoulder Instructions      Home Living Family/patient expects to be discharged to:: Private residence Living Arrangements:  Spouse/significant other Available Help at Discharge: Personal care attendant Type of Home: House Home Access: Ramped entrance     Home Layout: One level     Bathroom Shower/Tub: Walk-in shower;Door         Home Equipment: Wheelchair - [employer] - 2 wheels      Lives With: Spouse    Prior Functioning/Environment          Comments: Pt. required assist with ADLs form his wife.        OT Problem List: Decreased strength;Decreased knowledge of use of DME or AE;Decreased activity tolerance;Impaired UE functional use;Decreased range of motion      OT Treatment/Interventions: Self-care/ADL training;Therapeutic exercise;DME and/or AE instruction;Patient/family education;Therapeutic activities     OT Goals(Current goals can be found in the care plan section) Acute Rehab OT Goals Patient Stated Goal: To return home OT Goal Formulation: With patient Potential to Achieve Goals: Good  OT Frequency: Min 2X/week   Barriers to D/C:            Co-evaluation              AM-PAC OT "6 Clicks" Daily Activity     Outcome Measure Help from another person eating meals?: A Little Help from another person taking care of personal grooming?: A Little Help from another person toileting, which includes using toliet, bedpan, or urinal?: A Lot Help from another person bathing (including washing, rinsing, drying)?: A Lot Help from another person to put on and taking off regular upper body clothing?: A Lot Help from another person to put on and taking off regular lower body clothing?: A Lot 6 Click Score: 14   End of Session    Activity Tolerance: Patient tolerated treatment well Patient left: in bed;with call bell/phone within reach;with bed alarm set  OT Visit Diagnosis: Muscle weakness (generalized) (M62.81)                Time: 0045-9977 OT Time Calculation (min): 27 min Charges:  OT General Charges $OT Visit: 1 Visit OT Evaluation $OT Eval Moderate Complexity: 1 Mod  Harrel Carina, MS, OTR/L   Harrel Carina 12/11/2019, 12:43 PM

## 2019-12-11 NOTE — Plan of Care (Signed)
  Problem: Coping: Goal: Will identify appropriate support needs Outcome: Not Progressing

## 2019-12-12 LAB — CBC
HCT: 39.3 % (ref 39.0–52.0)
Hemoglobin: 13.6 g/dL (ref 13.0–17.0)
MCH: 31.1 pg (ref 26.0–34.0)
MCHC: 34.6 g/dL (ref 30.0–36.0)
MCV: 89.9 fL (ref 80.0–100.0)
Platelets: 162 10*3/uL (ref 150–400)
RBC: 4.37 MIL/uL (ref 4.22–5.81)
RDW: 13.3 % (ref 11.5–15.5)
WBC: 6.1 10*3/uL (ref 4.0–10.5)
nRBC: 0 % (ref 0.0–0.2)

## 2019-12-12 LAB — BASIC METABOLIC PANEL
Anion gap: 8 (ref 5–15)
BUN: 30 mg/dL — ABNORMAL HIGH (ref 8–23)
CO2: 29 mmol/L (ref 22–32)
Calcium: 9.8 mg/dL (ref 8.9–10.3)
Chloride: 101 mmol/L (ref 98–111)
Creatinine, Ser: 1.04 mg/dL (ref 0.61–1.24)
GFR calc Af Amer: >60 mL/min (ref >60)
GFR calc non Af Amer: >60 mL/min (ref >60)
Glucose, Bld: 110 mg/dL — ABNORMAL HIGH (ref 70–99)
Potassium: 4.5 mmol/L (ref 3.5–5.1)
Sodium: 138 mmol/L (ref 135–145)

## 2019-12-12 LAB — URINE CULTURE

## 2019-12-12 NOTE — Progress Notes (Signed)
PROGRESS NOTE    Jimmy [name]  VEL:[id number] DOB: [birth year] DOA: 12/09/2019 PCP: Ria Bush, MD  Assessment & Plan:   Active Problems:   TIA (transient ischemic attack)   Encephalopathy   Acute encephalopathy: etiology unclear, possibly worsening dementia vs infection. CT brain shows atrophy & no acute intracranial findings. MRI shows no acute intracranial findings, but multiple remote lacunar infarcts of b/l basal ganglia, thalami & pons.  Urine cx growing multiple species, likely contaminant.  Blood cx NGTD. Continue w/ neuro checks. Continue on aspirin. PT/OT consulted. EEG ordered but are not done over the weekends here.   Elevated troponins: likely secondary to demand ischemia as per cardio. NSTEMI/ACS  r/o.  Echo shows EF 25-85%, grade I diastolic function, no wall motion abnormalities, & mild aortic stensosi. Continue on aspirin. Continue on tele. Cardio following and recs apprec   HTN: uncontrolled. Continue on carvedilol as per cardio. Continue to hold lisinopril secondary to AKI   HLD: continue on home dose of statin  AKI: resolved   Dementia: will continue on home dose of donepezil   Gout: will continue on home dose of allopurinol   Thrombocytopenia: resolved   DVT prophylaxis: lovenox Code Status: partial  Family Communication:  Disposition Plan: depends on PT/OT recs   Status is: Inpatient  Remains inpatient appropriate because:Altered mental status, will consult hospice tomorrow morning as per request of pt's wife    Dispo: The patient is from: Home              Anticipated d/c is to: SNF              Anticipated d/c date is: 2 days              Patient currently is not medically stable     Consultants:   Cardio   Neuro    Procedures:    Antimicrobials:    Subjective: Pt is oriented to person only. Pt unable to answer questions appropriately.   Objective: Vitals:   12/11/19 1044 12/11/19 1400 12/11/19 1626 12/11/19 2330    BP: (!) 98/49 (!) 100/52 (!) 94/50 128/67  Pulse: 67 64 67 60  Resp:  18 20 18   Temp:  98 F (36.7 C) 97.9 F (36.6 C) 97.7 F (36.5 C)  TempSrc:  Oral Oral Oral  SpO2:  100% 99% 98%  Weight:      Height:        Intake/Output Summary (Last 24 hours) at 12/12/2019 0713 Last data filed at 12/12/2019 0514 Gross per 24 hour  Intake 360 ml  Output 250 ml  Net 110 ml   Filed Weights   12/09/19 1922  Weight: 68 kg    Examination:  General exam: Appears confused. Frail appearing  Respiratory system: clear breath sounds b/l. No rales Cardiovascular system: S1 & S2 +. No  rubs, gallops or clicks. . Gastrointestinal system: Abdomen is nondistended, soft and nontender. Hypoactive bowel sounds  Central nervous system: Alert and awake. Moves all 4 extremities  Psychiatry: Judgement and insight appear abnormal. Flat mood and affect     Data Reviewed: I have personally reviewed following labs and imaging studies  CBC: Recent Labs  Lab 12/09/19 1959 12/10/19 1131 12/11/19 0935  WBC 10.8* 6.1 7.2  NEUTROABS 8.1*  --   --   HGB 15.3 13.2 13.9  HCT 44.6 36.1* 40.1  MCV 91.8 87.4 90.7  PLT 211 149* 277   Basic Metabolic Panel: Recent Labs  Lab 12/09/19 1959  12/10/19 1131 12/11/19 0935  NA 140 140 137  K 3.8 3.4* 3.7  CL 100 104 103  CO2 28 24 27   GLUCOSE 145* 141* 98  BUN 39* 37* 29*  CREATININE 1.37* 1.03 0.79  CALCIUM 10.1 9.2 9.4  MG  --  2.1  --    GFR: Estimated Creatinine Clearance: 61.4 mL/min (by C-G formula based on SCr of 0.79 mg/dL). Liver Function Tests: Recent Labs  Lab 12/09/19 1959  AST 28  ALT 27  ALKPHOS 110  BILITOT 1.2  PROT 7.5  ALBUMIN 4.3   No results for input(s): LIPASE, AMYLASE in the last 168 hours. No results for input(s): AMMONIA in the last 168 hours. Coagulation Profile: No results for input(s): INR, PROTIME in the last 168 hours. Cardiac Enzymes: No results for input(s): CKTOTAL, CKMB, CKMBINDEX, TROPONINI in the last  168 hours. BNP (last 3 results) No results for input(s): PROBNP in the last 8760 hours. HbA1C: Recent Labs    12/10/19 0403  HGBA1C 5.4   CBG: No results for input(s): GLUCAP in the last 168 hours. Lipid Profile: Recent Labs    12/10/19 0403  CHOL 106  HDL 35*  LDLCALC 48  TRIG 114  CHOLHDL 3.0   Thyroid Function Tests: No results for input(s): TSH, T4TOTAL, FREET4, T3FREE, THYROIDAB in the last 72 hours. Anemia Panel: No results for input(s): VITAMINB12, FOLATE, FERRITIN, TIBC, IRON, RETICCTPCT in the last 72 hours. Sepsis Labs: No results for input(s): PROCALCITON, LATICACIDVEN in the last 168 hours.  Recent Results (from the past 240 hour(s))  SARS Coronavirus 2 by RT PCR (hospital order, performed in [facility] hospital lab) Nasopharyngeal Nasopharyngeal Swab     Status: None   Collection Time: 12/04/19  9:49 PM   Specimen: Nasopharyngeal Swab  Result Value Ref Range Status   SARS Coronavirus 2 NEGATIVE NEGATIVE Final    Comment: (NOTE) SARS-CoV-2 target nucleic acids are NOT DETECTED.  The SARS-CoV-2 RNA is generally detectable in upper and lower respiratory specimens during the acute phase of infection. The lowest concentration of SARS-CoV-2 viral copies this assay can detect is 250 copies / mL. A negative result does not preclude SARS-CoV-2 infection and should not be used as the sole basis for treatment or other patient management decisions.  A negative result may occur with improper specimen collection / handling, submission of specimen other than nasopharyngeal swab, presence of viral mutation(s) within the areas targeted by this assay, and inadequate number of viral copies (<250 copies / mL). A negative result must be combined with clinical observations, patient history, and epidemiological information.  Fact Sheet for Patients:   StrictlyIdeas.no  Fact Sheet for Healthcare  Providers: BankingDealers.co.za  This test is not yet approved or  cleared by the [place] FDA and has been authorized for detection and/or diagnosis of SARS-CoV-2 by FDA under an Emergency Use Authorization (EUA).  This EUA will remain in effect (meaning this test can be used) for the duration of the COVID-19 declaration under Section 564(b)(1) of the Act, 21 U.S.C. section 360bbb-3(b)(1), unless the authorization is terminated or revoked sooner.  Performed at [facility], [place] [address], [place], [place] [address]   SARS Coronavirus 2 by RT PCR (hospital order, performed in [facility] hospital lab) Nasopharyngeal Nasopharyngeal Swab     Status: None   Collection Time: 12/10/19  4:03 AM   Specimen: Nasopharyngeal Swab  Result Value Ref Range Status   SARS Coronavirus 2 NEGATIVE NEGATIVE Final  Comment: (NOTE) SARS-CoV-2 target nucleic acids are NOT DETECTED.  The SARS-CoV-2 RNA is generally detectable in upper and lower respiratory specimens during the acute phase of infection. The lowest concentration of SARS-CoV-2 viral copies this assay can detect is 250 copies / mL. A negative result does not preclude SARS-CoV-2 infection and should not be used as the sole basis for treatment or other patient management decisions.  A negative result may occur with improper specimen collection / handling, submission of specimen other than nasopharyngeal swab, presence of viral mutation(s) within the areas targeted by this assay, and inadequate number of viral copies (<250 copies / mL). A negative result must be combined with clinical observations, patient history, and epidemiological information.  Fact Sheet for Patients:   StrictlyIdeas.no  Fact Sheet for Healthcare Providers: BankingDealers.co.za  This test is not yet approved or  cleared by the [place] FDA and has been authorized for detection  and/or diagnosis of SARS-CoV-2 by FDA under an Emergency Use Authorization (EUA).  This EUA will remain in effect (meaning this test can be used) for the duration of the COVID-19 declaration under Section 564(b)(1) of the Act, 21 U.S.C. section 360bbb-3(b)(1), unless the authorization is terminated or revoked sooner.  Performed at [facility], [address] [place], [place] [address]   CULTURE, BLOOD (ROUTINE X 2) w Reflex to ID Panel     Status: None (Preliminary result)   Collection Time: 12/10/19  3:27 PM   Specimen: BLOOD  Result Value Ref Range Status   Specimen Description BLOOD BLOOD RIGHT WRIST  Final   Special Requests   Final    BOTTLES DRAWN AEROBIC AND ANAEROBIC Blood Culture adequate volume   Culture   Final    NO GROWTH < 24 HOURS Performed at [facility], [address] [place], [place] [address]    Report Status PENDING  Incomplete  CULTURE, BLOOD (ROUTINE X 2) w Reflex to ID Panel     Status: None (Preliminary result)   Collection Time: 12/10/19  3:39 PM   Specimen: BLOOD  Result Value Ref Range Status   Specimen Description BLOOD BLOOD LEFT HAND  Final   Special Requests   Final    BOTTLES DRAWN AEROBIC AND ANAEROBIC Blood Culture adequate volume   Culture   Final    NO GROWTH < 24 HOURS Performed at [facility], [address], [place],  [address]    Report Status PENDING  Incomplete         Radiology Studies: US Carotid Bilateral (at [facility] and [facility] only)  Result Date: 12/10/2019 CLINICAL DATA:  86 year old male with TIA EXAM: BILATERAL CAROTID DUPLEX ULTRASOUND TECHNIQUE: Pearline Cables scale imaging, color Doppler and duplex ultrasound were performed of bilateral carotid and vertebral arteries in the neck. COMPARISON:  None. FINDINGS: Criteria: Quantification of carotid stenosis is based on velocity parameters that correlate the residual internal carotid diameter with NASCET-based stenosis levels, using the diameter of the  distal internal carotid lumen as the denominator for stenosis measurement. The following velocity measurements were obtained: RIGHT ICA:  Systolic 213 cm/sec, Diastolic 28 cm/sec CCA:  94 cm/sec SYSTOLIC ICA/CCA RATIO:  1.6 ECA:  354 cm/sec LEFT ICA:  Systolic 086 cm/sec, Diastolic 21 cm/sec CCA:  58 cm/sec SYSTOLIC ICA/CCA RATIO:  1.8 ECA:  91 cm/sec Right Brachial SBP: Not acquired Left Brachial SBP: Not acquired RIGHT CAROTID ARTERY: No significant calcifications of the right common carotid artery. Intermediate waveform maintained. Moderate heterogeneous and partially calcified plaque at  the right carotid bifurcation. No significant lumen shadowing. Low resistance waveform of the right ICA. No significant tortuosity. RIGHT VERTEBRAL ARTERY: Antegrade flow with low resistance waveform. LEFT CAROTID ARTERY: No significant calcifications of the left common carotid artery. Intermediate waveform maintained. Moderate heterogeneous and partially calcified plaque at the left carotid bifurcation. No significant lumen shadowing. Low resistance waveform of the left ICA. No significant tortuosity. LEFT VERTEBRAL ARTERY:  Antegrade flow with low resistance waveform. IMPRESSION: Right: Heterogeneous and partially calcified plaque at the right carotid bifurcation, with discordant results regarding degree of stenosis by established duplex criteria. Peak velocity suggests 50%-69% stenosis, with the ICA/ CCA ratio suggesting a lesser degree of stenosis. If establishing a more accurate degree of stenosis is required, cerebral angiogram should be considered, or as a second best test, CTA. Left: Color duplex indicates moderate heterogeneous and calcified plaque, with no hemodynamically significant stenosis by duplex criteria in the extracranial cerebrovascular circulation. Signed, Dulcy Fanny. Dellia Nims, RPVI [facility] Electronically Signed   By: Corrie Mckusick D.O.   On:  12/10/2019 11:35   ECHOCARDIOGRAM COMPLETE  Result Date: 12/10/2019    ECHOCARDIOGRAM REPORT   Patient Name:   Jimmy [name] [facility] Date of Exam: 12/10/2019 Medical Rec #:  [id number]        Height:       69.0 in Accession #:    [id number]       Weight:       150.0 lb Date of Birth:  [birth year]        BSA:          1.828 m Patient Age:    86 years         BP:           173/76 mmHg Patient Gender: M                HR:           89 bpm. Exam Location:  [facility] Procedure: 2D Echo, Color Doppler and Cardiac Doppler Indications:     G45.9 TIA  History:         Patient has prior history of Echocardiogram examinations. CHF,                  CAD, Stroke; Risk Factors:Hypertension and Dyslipidemia.  Sonographer:     Charmayne Sheer RDCS (AE) Referring Phys:  [id number] Arvella Merles MANSY Diagnosing Phys: Ida Rogue MD  Sonographer Comments: Suboptimal apical window and suboptimal subcostal window. Image acquisition challenging due to uncooperative patient and Image acquisition challenging due to respiratory motion. IMPRESSIONS  1. Left ventricular ejection fraction, by estimation, is 60 to 65%. The left ventricle has normal function. The left ventricle has no regional wall motion abnormalities. Left ventricular diastolic parameters are consistent with Grade I diastolic dysfunction (impaired relaxation).  2. Right ventricular systolic function is normal. The right ventricular size is normal.  3. Mild aortic valve stenosis. FINDINGS  Left Ventricle: Left ventricular ejection fraction, by estimation, is 60 to 65%. The left ventricle has normal function. The left ventricle has no regional wall motion abnormalities. The left ventricular internal cavity size was normal in size. There is  no left ventricular hypertrophy. Left ventricular diastolic parameters are consistent with Grade I diastolic dysfunction (impaired relaxation). Right Ventricle: The right ventricular size is normal. No increase in right ventricular wall thickness. Right  ventricular systolic function is normal. Left Atrium: Left atrial size was normal in size. Right Atrium: Right  atrial size was normal in size. Pericardium: There is no evidence of pericardial effusion. Mitral Valve: The mitral valve is normal in structure. No evidence of mitral valve regurgitation. No evidence of mitral valve stenosis. MV peak gradient, 8.8 mmHg. The mean mitral valve gradient is 2.0 mmHg. Tricuspid Valve: The tricuspid valve is normal in structure. Tricuspid valve regurgitation is not demonstrated. No evidence of tricuspid stenosis. Aortic Valve: The aortic valve is normal in structure. Aortic valve regurgitation is not visualized. Mild aortic stenosis is present. Aortic valve mean gradient measures 7.0 mmHg. Aortic valve peak gradient measures 14.3 mmHg. Aortic valve area, by VTI measures 1.48 cm. Pulmonic Valve: The pulmonic valve was normal in structure. Pulmonic valve regurgitation is not visualized. No evidence of pulmonic stenosis. Aorta: The aortic root is normal in size and structure. Venous: The inferior vena cava is normal in size with greater than 50% respiratory variability, suggesting right atrial pressure of 3 mmHg. IAS/Shunts: No atrial level shunt detected by color flow Doppler.  LEFT VENTRICLE PLAX 2D LVIDd:         3.26 cm  Diastology LVIDs:         2.09 cm  LV e' medial:    3.81 cm/s LV PW:         1.10 cm  LV E/e' medial:  15.9 LV IVS:        0.98 cm  LV e' lateral:   8.16 cm/s LVOT diam:     2.10 cm  LV E/e' lateral: 7.4 LV SV:         61 LV SV Index:   33 LVOT Area:     3.46 cm  LEFT ATRIUM           Index LA diam:      2.40 cm 1.31 cm/m LA Vol (A4C): 25.4 ml 13.89 ml/m  AORTIC VALVE                    PULMONIC VALVE AV Area (Vmax):    1.57 cm     PV Vmax:       1.15 m/s AV Area (Vmean):   1.55 cm     PV Vmean:      76.600 cm/s AV Area (VTI):     1.48 cm     PV VTI:        0.204 m AV Vmax:           189.00 cm/s  PV Peak grad:  5.3 mmHg AV Vmean:          130.000 cm/s PV  Mean grad:  3.0 mmHg AV VTI:            0.411 m AV Peak Grad:      14.3 mmHg AV Mean Grad:      7.0 mmHg LVOT Vmax:         85.70 cm/s LVOT Vmean:        58.300 cm/s LVOT VTI:          0.176 m LVOT/AV VTI ratio: 0.43  AORTA Ao Root diam: 3.10 cm MITRAL VALVE MV Area (PHT): 4.06 cm     SHUNTS MV Peak grad:  8.8 mmHg     Systemic VTI:  0.18 m MV Mean grad:  2.0 mmHg     Systemic Diam: 2.10 cm MV Vmax:       1.48 m/s MV Vmean:      70.8 cm/s MV Decel Time: 187 msec MV E velocity: 60.60 cm/s  MV A velocity: 114.00 cm/s MV E/A ratio:  0.53 Ida Rogue MD Electronically signed by Ida Rogue MD Signature Date/Time: 12/10/2019/5:12:41 PM    Final         Scheduled Meds: .  stroke: mapping our early stages of recovery book   Does not apply Once  . allopurinol  300 mg Oral QHS  . aspirin  300 mg Rectal Daily   Or  . aspirin EC  325 mg Oral Daily  . carvedilol  3.125 mg Oral BID WC  . donepezil  5 mg Oral QHS  . enoxaparin (LOVENOX) injection  40 mg Subcutaneous Q24H  . simvastatin  20 mg Oral q1800   Continuous Infusions:    LOS: 2 days    Time spent: 31 mins    Wyvonnia Dusky, MD [facility] Pager [phone]  If 7PM-7AM, please contact night-coverage [email or web address] 12/12/2019, 7:13 AM

## 2019-12-12 NOTE — Progress Notes (Signed)
Pts BP 97/55, recheck 107/63. MD notified, per MD ok to give scheduled Coreg.

## 2019-12-13 ENCOUNTER — Other Ambulatory Visit: Payer: Self-pay | Admitting: Family Medicine

## 2019-12-13 ENCOUNTER — Telehealth: Payer: Self-pay | Admitting: Family Medicine

## 2019-12-13 ENCOUNTER — Other Ambulatory Visit: Payer: Self-pay

## 2019-12-13 ENCOUNTER — Other Ambulatory Visit: Payer: Medicare Other

## 2019-12-13 ENCOUNTER — Ambulatory Visit: Payer: Medicare Other

## 2019-12-13 DIAGNOSIS — R569 Unspecified convulsions: Secondary | ICD-10-CM

## 2019-12-13 DIAGNOSIS — F0281 Dementia in other diseases classified elsewhere with behavioral disturbance: Secondary | ICD-10-CM

## 2019-12-13 DIAGNOSIS — G20 Parkinson's disease: Secondary | ICD-10-CM

## 2019-12-13 LAB — BASIC METABOLIC PANEL
Anion gap: 8 (ref 5–15)
BUN: 31 mg/dL — ABNORMAL HIGH (ref 8–23)
CO2: 28 mmol/L (ref 22–32)
Calcium: 9.2 mg/dL (ref 8.9–10.3)
Chloride: 101 mmol/L (ref 98–111)
Creatinine, Ser: 1.16 mg/dL (ref 0.61–1.24)
GFR calc Af Amer: >60 mL/min (ref >60)
GFR calc non Af Amer: 56 mL/min — ABNORMAL LOW (ref >60)
Glucose, Bld: 131 mg/dL — ABNORMAL HIGH (ref 70–99)
Potassium: 3.8 mmol/L (ref 3.5–5.1)
Sodium: 137 mmol/L (ref 135–145)

## 2019-12-13 LAB — CBC
HCT: 35.3 % — ABNORMAL LOW (ref 39.0–52.0)
Hemoglobin: 12.3 g/dL — ABNORMAL LOW (ref 13.0–17.0)
MCH: 31.5 pg (ref 26.0–34.0)
MCHC: 34.8 g/dL (ref 30.0–36.0)
MCV: 90.3 fL (ref 80.0–100.0)
Platelets: 150 10*3/uL (ref 150–400)
RBC: 3.91 MIL/uL — ABNORMAL LOW (ref 4.22–5.81)
RDW: 13.3 % (ref 11.5–15.5)
WBC: 6.7 10*3/uL (ref 4.0–10.5)
nRBC: 0 % (ref 0.0–0.2)

## 2019-12-13 NOTE — Care Management Important Message (Signed)
Important Message  Patient Details  Name: Jimmy [name] MRN: [id number] Date of Birth: 10/04/1928   Medicare Important Message Given:  Yes     Dannette Barbara 12/13/2019, 12:06 PM

## 2019-12-13 NOTE — Telephone Encounter (Signed)
Yes I will be hospice attending. Thanks.

## 2019-12-13 NOTE — Procedures (Signed)
Patient Name: Jimmy [name]  MRN: [id number]  Epilepsy Attending: Lora Havens  Referring Physician/Provider: Dr Kerney Elbe Date: 12/13/2019 Duration: 26.41 mins  Patient history: 86 year old male with mild dementia and gradually worsening cognitive impairment x 1 year, presenting to the ED with acute AMS. EEG to evaluate for seizure.  Level of alertness: Awake  AEDs during EEG study: None  Technical aspects: This EEG study was done with scalp electrodes positioned according to the 10-20 International system of electrode placement. Electrical activity was acquired at a sampling rate of 500Hz  and reviewed with a high frequency filter of 70Hz  and a low frequency filter of 1Hz . EEG data were recorded continuously and digitally stored.   Description: The posterior dominant rhythm consists of 9 Hz activity of moderate voltage (25-35 uV) seen predominantly in posterior head regions, symmetric and reactive to eye opening and eye closing. EEG showed intermittent generalized and maximal left temporal region3 to 6 Hz theta-delta slowing.  Physiologic photic driving was not seen during photic stimulation.  Hyperventilation was not performed.     ABNORMALITY -Intermittent slow, generalized and maximal left temporal rgion  IMPRESSION: This study is suggestive of non specific cortical dysfunction in left temporal region as well as mild diffuse encephalopathy, nonspecific etiology. No seizures or epileptiform discharges were seen throughout the recording.  Katalina Magri Barbra Sarks

## 2019-12-13 NOTE — Progress Notes (Addendum)
Physical Therapy Treatment Patient Details Name: Jimmy [name] MRN: [id number] DOB: [birth year] Today's Date: 12/13/2019    History of Present Illness Pt. is an 86 y.o. male who was admitted to [facility] with AMS, decreased responsiveness. Pt. was admitted for management of acute metabolic encephalopathy, TIA/CVA work up. Noted with elevation in troponin (peak 445), demand ischemia per cardiology.    PT Comments    Pt ready for session.  Supine AAROM ankle pumps, heel slides, ab/add and SLR x 10.  To EOB with mod vc's and min/mod a x 1.  Stood with mod a x 1 to walker.  Pt was able to march in place with very small step heights and length and able to sidestep x 3 along EOB.  +2 called for gait.  Stood and pt was inc BM and transitioned to commode for small BM.  Care provided as appropriate.  Pt was able to turn to recliner and initiated gait attempts when transport tech arrived to take pt to testing.  Returned to bed with mod a x 1.    Per chart plan is home with Hospice.  Pt stated he lives with his wife.  He has a walker and hospital bed at home.  We will benefit from a wheelchair for in home mobility and increased support at home.  Stated he has an aide for bathing.  Pt continues to require increased assist for mobility.  He stated he was able to walk in his home with walker prior to admission.   Wife was in earlier but unable to find her for session to discuss discharge and her ability to help him.  Concerns if wife will be able to provide physical assist he now requires for a safe discharge home.     Patient suffers from TIA/CVA which impairs his/her ability to perform daily activities like toileting, feeding, dressing, grooming, bathing in the home. A cane, walker, crutch will not resolve the patient's issue with performing activities of daily living. A lightweight wheelchair and cushion is required/recommended and will allow patient to safely perform daily activities.   Patient can safely  propel the wheelchair in the home or has a caregiver who can provide assistance.    Addendum.  Discussed with Santiago Glad from [facility] regarding home and mobility expectations.  Anticipates primarily transferring bed/commode with limited mobility at home.  Care felt to be appropriate and able to be managed at home.   Follow Up Recommendations  SNF     Equipment Recommendations  Rolling walker with 5" wheels;3in1 (PT);Wheelchair (measurements PT);Wheelchair cushion (measurements PT)    Recommendations for Other Services       Precautions / Restrictions Precautions Precautions: Fall Restrictions Weight Bearing Restrictions: No    Mobility  Bed Mobility Overal bed mobility: Needs Assistance Bed Mobility: Supine to Sit;Sit to Supine     Supine to sit: Min assist;Mod assist Sit to supine: Mod assist      Transfers Overall transfer level: Needs assistance Equipment used: Rolling walker (2 wheeled) Transfers: Sit to/from Stand Sit to Stand: Min assist;Mod assist            Ambulation/Gait Ambulation/Gait assistance: Min assist;+2 physical assistance Gait Distance (Feet): 3 Feet Assistive device: Rolling walker (2 wheeled) Gait Pattern/deviations: Step-to pattern;Decreased step length - right;Decreased step length - left;Trunk flexed Gait velocity: decreased   General Gait Details: few steps to commode and to turn in room   [employer]  Mobility    Modified Rankin (Stroke Patients Only)       Balance Overall balance assessment: Needs assistance Sitting-balance support: Feet supported Sitting balance-Leahy Scale: Fair Sitting balance - Comments: able to sit unsupported but with supervision   Standing balance support: Bilateral upper extremity supported Standing balance-Leahy Scale: Poor Standing balance comment: hands on assist for safety                            Cognition Arousal/Alertness: Awake/alert Behavior During  Therapy: WFL for tasks assessed/performed Overall Cognitive Status: Within Functional Limits for tasks assessed                                        Exercises Other Exercises Other Exercises: to commode for BM    General Comments        Pertinent Vitals/Pain Pain Assessment: No/denies pain    Home Living                      Prior Function            PT Goals (current goals can now be found in the care plan section) Progress towards PT goals: Progressing toward goals    Frequency    Min 2X/week      PT Plan Current plan remains appropriate    Co-evaluation              AM-PAC PT "6 Clicks" Mobility   Outcome Measure  Help needed turning from your back to your side while in a flat bed without using bedrails?: A Lot Help needed moving from lying on your back to sitting on the side of a flat bed without using bedrails?: A Lot Help needed moving to and from a bed to a chair (including a wheelchair)?: A Lot Help needed standing up from a chair using your arms (e.g., wheelchair or bedside chair)?: A Lot Help needed to walk in hospital room?: A Lot Help needed climbing 3-5 steps with a railing? : Total 6 Click Score: 11    End of Session Equipment Utilized During Treatment: Gait belt Activity Tolerance: Patient tolerated treatment well Patient left: in bed Nurse Communication: Mobility status Hemiplegia - Right/Left: Left Hemiplegia - dominant/non-dominant: Non-dominant Hemiplegia - caused by: Cerebral infarction     Time: 1275-1700 PT Time Calculation (min) (ACUTE ONLY): 39 min  Charges:  $Gait Training: 8-22 mins $Therapeutic Activity: 23-37 mins                    Chesley Noon, PTA 12/13/19, 2:34 PM

## 2019-12-13 NOTE — Telephone Encounter (Signed)
Spoke with Roderic Ovens relaying Dr. Synthia Innocent message.

## 2019-12-13 NOTE — Progress Notes (Signed)
PROGRESS NOTE    Jimmy [name]  WHQ:[id number] DOB: 16-Aug-1928 DOA: 12/09/2019 PCP: Ria Bush, MD  Assessment & Plan:   Active Problems:   TIA (transient ischemic attack)   Encephalopathy   Acute encephalopathy: etiology unclear, possibly worsening dementia vs infection. Unchanged from day prior. CT brain shows atrophy & no acute intracranial findings. MRI shows no acute intracranial findings, but multiple remote lacunar infarcts of b/l basal ganglia, thalami & pons.  Urine cx growing multiple species, likely contaminant.  Blood cx NGTD. Continue w/ neuro checks. Continue on aspirin. PT/OT recs SNF. EEG ordered & will hopefully be done today   Dementia: will continue on home dose of donepezil   Failure to thrive: declining functional & mental status for at least the last month. Hospice consulted as per request of pt's wife   Elevated troponins: likely secondary to demand ischemia as per cardio. NSTEMI/ACS  r/o.  Echo shows EF 65-99%, grade I diastolic function, no wall motion abnormalities, & mild aortic stensosi. Continue on aspirin. Continue on tele. Cardio following and recs apprec   HTN: uncontrolled. Continue on carvedilol as per cardio. Continue to hold lisinopril secondary to AKI   HLD: continue on home dose of statin  AKI: resolved   Gout: will continue on home dose of allopurinol   Thrombocytopenia: resolved   DVT prophylaxis: lovenox Code Status: partial  Family Communication:  Disposition Plan: depends on PT/OT recs   Status is: Inpatient  Remains inpatient appropriate because:Altered mental status, hospice consulted today    Dispo: The patient is from: Home              Anticipated d/c is to: SNF vs hospice               Anticipated d/c date is: 2 days              Patient currently is not medically stable     Consultants:   Cardio   Neuro    Procedures:    Antimicrobials:    Subjective: Pt is oriented to person & place only.    Objective: Vitals:   12/12/19 1612 12/12/19 2119 12/13/19 0039 12/13/19 0302  BP: 107/63 (!) 106/59 130/78 128/63  Pulse: 61 61 66 66  Resp:  16 16 16   Temp:  (!) 97.5 F (36.4 C) 98.3 F (36.8 C) (!) 97.4 F (36.3 C)  TempSrc:  Oral Oral Oral  SpO2: 98% 96% 97% 97%  Weight:      Height:        Intake/Output Summary (Last 24 hours) at 12/13/2019 0731 Last data filed at 12/13/2019 0645 Gross per 24 hour  Intake --  Output 550 ml  Net -550 ml   Filed Weights   12/09/19 1922  Weight: 68 kg    Examination:  General exam: Appears calm and comfortable. Frail appearing  Respiratory system: clear breath sounds b/l. No rales, rhonchi Cardiovascular system: S1 & S2 +. No  rubs, gallops or clicks. . Gastrointestinal system: Abdomen is nondistended, soft and nontender. Hypoactive bowel sounds  Central nervous system: Alert and awake. Moves all 4 extremities  Psychiatry: Judgement and insight appear abnormal. Flat mood and affect     Data Reviewed: I have personally reviewed following labs and imaging studies  CBC: Recent Labs  Lab 12/09/19 1959 12/10/19 1131 12/11/19 0935 12/12/19 0728 12/13/19 0334  WBC 10.8* 6.1 7.2 6.1 6.7  NEUTROABS 8.1*  --   --   --   --  HGB 15.3 13.2 13.9 13.6 12.3*  HCT 44.6 36.1* 40.1 39.3 35.3*  MCV 91.8 87.4 90.7 89.9 90.3  PLT 211 149* 159 162 096   Basic Metabolic Panel: Recent Labs  Lab 12/09/19 1959 12/10/19 1131 12/11/19 0935 12/12/19 0728 12/13/19 0334  NA 140 140 137 138 137  K 3.8 3.4* 3.7 4.5 3.8  CL 100 104 103 101 101  CO2 28 24 27 29 28   GLUCOSE 145* 141* 98 110* 131*  BUN 39* 37* 29* 30* 31*  CREATININE 1.37* 1.03 0.79 1.04 1.16  CALCIUM 10.1 9.2 9.4 9.8 9.2  MG  --  2.1  --   --   --    GFR: Estimated Creatinine Clearance: 42.3 mL/min (by C-G formula based on SCr of 1.16 mg/dL). Liver Function Tests: Recent Labs  Lab 12/09/19 1959  AST 28  ALT 27  ALKPHOS 110  BILITOT 1.2  PROT 7.5  ALBUMIN 4.3    No results for input(s): LIPASE, AMYLASE in the last 168 hours. No results for input(s): AMMONIA in the last 168 hours. Coagulation Profile: No results for input(s): INR, PROTIME in the last 168 hours. Cardiac Enzymes: No results for input(s): CKTOTAL, CKMB, CKMBINDEX, TROPONINI in the last 168 hours. BNP (last 3 results) No results for input(s): PROBNP in the last 8760 hours. HbA1C: No results for input(s): HGBA1C in the last 72 hours. CBG: No results for input(s): GLUCAP in the last 168 hours. Lipid Profile: No results for input(s): CHOL, HDL, LDLCALC, TRIG, CHOLHDL, LDLDIRECT in the last 72 hours. Thyroid Function Tests: No results for input(s): TSH, T4TOTAL, FREET4, T3FREE, THYROIDAB in the last 72 hours. Anemia Panel: No results for input(s): VITAMINB12, FOLATE, FERRITIN, TIBC, IRON, RETICCTPCT in the last 72 hours. Sepsis Labs: No results for input(s): PROCALCITON, LATICACIDVEN in the last 168 hours.  Recent Results (from the past 240 hour(s))  SARS Coronavirus 2 by RT PCR (hospital order, performed in [facility] hospital lab) Nasopharyngeal Nasopharyngeal Swab     Status: None   Collection Time: 12/04/19  9:49 PM   Specimen: Nasopharyngeal Swab  Result Value Ref Range Status   SARS Coronavirus 2 NEGATIVE NEGATIVE Final    Comment: (NOTE) SARS-CoV-2 target nucleic acids are NOT DETECTED.  The SARS-CoV-2 RNA is generally detectable in upper and lower respiratory specimens during the acute phase of infection. The lowest concentration of SARS-CoV-2 viral copies this assay can detect is 250 copies / mL. A negative result does not preclude SARS-CoV-2 infection and should not be used as the sole basis for treatment or other patient management decisions.  A negative result may occur with improper specimen collection / handling, submission of specimen other than nasopharyngeal swab, presence of viral mutation(s) within the areas targeted by this assay, and inadequate number  of viral copies (<250 copies / mL). A negative result must be combined with clinical observations, patient history, and epidemiological information.  Fact Sheet for Patients:   StrictlyIdeas.no  Fact Sheet for Healthcare Providers: BankingDealers.co.za  This test is not yet approved or  cleared by the [place] FDA and has been authorized for detection and/or diagnosis of SARS-CoV-2 by FDA under an Emergency Use Authorization (EUA).  This EUA will remain in effect (meaning this test can be used) for the duration of the COVID-19 declaration under Section 564(b)(1) of the Act, 21 U.S.C. section 360bbb-3(b)(1), unless the authorization is terminated or revoked sooner.  Performed at [facility], [place] [address], [place],  [address]  SARS Coronavirus 2 by RT PCR (hospital order, performed in [facility] hospital lab) Nasopharyngeal Nasopharyngeal Swab     Status: None   Collection Time: 12/10/19  4:03 AM   Specimen: Nasopharyngeal Swab  Result Value Ref Range Status   SARS Coronavirus 2 NEGATIVE NEGATIVE Final    Comment: (NOTE) SARS-CoV-2 target nucleic acids are NOT DETECTED.  The SARS-CoV-2 RNA is generally detectable in upper and lower respiratory specimens during the acute phase of infection. The lowest concentration of SARS-CoV-2 viral copies this assay can detect is 250 copies / mL. A negative result does not preclude SARS-CoV-2 infection and should not be used as the sole basis for treatment or other patient management decisions.  A negative result may occur with improper specimen collection / handling, submission of specimen other than nasopharyngeal swab, presence of viral mutation(s) within the areas targeted by this assay, and inadequate number of viral copies (<250 copies / mL). A negative result must be combined with clinical observations, patient history, and epidemiological information.  Fact Sheet for  Patients:   StrictlyIdeas.no  Fact Sheet for Healthcare Providers: BankingDealers.co.za  This test is not yet approved or  cleared by the [place] FDA and has been authorized for detection and/or diagnosis of SARS-CoV-2 by FDA under an Emergency Use Authorization (EUA).  This EUA will remain in effect (meaning this test can be used) for the duration of the COVID-19 declaration under Section 564(b)(1) of the Act, 21 U.S.C. section 360bbb-3(b)(1), unless the authorization is terminated or revoked sooner.  Performed at [facility], [address], [place], [place] [address]   Urine Culture     Status: Abnormal   Collection Time: 12/10/19  6:32 AM   Specimen: Urine, Random  Result Value Ref Range Status   Specimen Description   Final    URINE, RANDOM Performed at [facility], [address], [place], [place] [address]    Special Requests   Final    NONE Performed at [facility], [address] [place], [place] [address]    Culture MULTIPLE SPECIES PRESENT, SUGGEST RECOLLECTION (A)  Final   Report Status 12/12/2019 FINAL  Final  CULTURE, BLOOD (ROUTINE X 2) w Reflex to ID Panel     Status: None (Preliminary result)   Collection Time: 12/10/19  3:27 PM   Specimen: BLOOD  Result Value Ref Range Status   Specimen Description BLOOD BLOOD RIGHT WRIST  Final   Special Requests   Final    BOTTLES DRAWN AEROBIC AND ANAEROBIC Blood Culture adequate volume   Culture   Final    NO GROWTH 3 DAYS Performed at [facility], [address], [place], [place] [address]    Report Status PENDING  Incomplete  CULTURE, BLOOD (ROUTINE X 2) w Reflex to ID Panel     Status: None (Preliminary result)   Collection Time: 12/10/19  3:39 PM   Specimen: BLOOD  Result Value Ref Range Status   Specimen Description BLOOD BLOOD LEFT HAND  Final   Special Requests   Final    BOTTLES DRAWN AEROBIC AND ANAEROBIC  Blood Culture adequate volume   Culture   Final    NO GROWTH 3 DAYS Performed at [facility], [address], [place], [place] [address]    Report Status PENDING  Incomplete         Radiology Studies: No results found.      Scheduled Meds: .  stroke: mapping our early stages of recovery  book   Does not apply Once  . allopurinol  300 mg Oral QHS  . aspirin  300 mg Rectal Daily   Or  . aspirin EC  325 mg Oral Daily  . carvedilol  3.125 mg Oral BID WC  . donepezil  5 mg Oral QHS  . enoxaparin (LOVENOX) injection  40 mg Subcutaneous Q24H  . simvastatin  20 mg Oral q1800   Continuous Infusions:    LOS: 3 days    Time spent: 30 mins    Wyvonnia Dusky, MD [facility] Pager [phone]  If 7PM-7AM, please contact night-coverage [email or web address] 12/13/2019, 7:31 AM

## 2019-12-13 NOTE — Progress Notes (Signed)
eeg done °

## 2019-12-13 NOTE — Telephone Encounter (Signed)
Jimmy [name] @ [facility] called pt is being discharged to hospice and wanted to know if you will be attending physican  Can let leave message

## 2019-12-13 NOTE — TOC Initial Note (Signed)
Transition of Care [facility]) - Initial/Assessment Note    Patient Details  Name: Jimmy [name] MRN: [id number] Date of Birth: 1928/11/13  [facility] CM/SW Contact:    Shelbie Ammons, RN Phone Number: 12/13/2019, 9:30 AM  Clinical Narrative:   RNCM placed call to patient's wife for assessment. Per report of attending patient's wife is requesting Hospice services. Spoke with wife Jimmy [name] who reports patient lives at home with her and it is her wishes to take him back home, she reports that she has all necessary equipment in the home except maybe some bars for the bathroom. She reports that he already sleeps in a hospital bed and uses a wheelchair for moving around. She reports that she is familiar with [facility]'s services and requests that referral be given to them.  RNCM reached out to Santiago Glad with [facility] and she will assess for appropriateness.                 Expected Discharge Plan: Home w Hospice Care Barriers to Discharge: No Barriers Identified   Patient Goals and CMS Choice        Expected Discharge Plan and Services Expected Discharge Plan: [place] Acute Care Choice: Hospice Living arrangements for the past 2 months: Single Family Home                                      Prior Living Arrangements/Services Living arrangements for the past 2 months: Single Family Home Lives with:: Spouse Patient language and need for interpreter reviewed:: Yes Do you feel safe going back to the place where you live?: Yes      Need for Family Participation in Patient Care: Yes (Comment) Care giver support system in place?: Yes (comment)   Criminal Activity/Legal Involvement Pertinent to Current Situation/Hospitalization: No - Comment as needed  Activities of Daily Living Home Assistive Devices/Equipment: Bedside commode/3-in-1 ADL Screening (condition at time of admission) Patient's cognitive ability adequate to safely complete daily  activities?: No Is the patient deaf or have difficulty hearing?: No Does the patient have difficulty seeing, even when wearing glasses/contacts?: No Does the patient have difficulty concentrating, remembering, or making decisions?: Yes Patient able to express need for assistance with ADLs?: Yes Does the patient have difficulty dressing or bathing?: Yes Independently performs ADLs?: No Does the patient have difficulty walking or climbing stairs?: Yes Weakness of Legs: Both Weakness of Arms/Hands: Both  Permission Sought/Granted                  Emotional Assessment              Admission diagnosis:  TIA (transient ischemic attack) [G45.9] Encephalopathy [G93.40] Elevated troponin [R77.8] Altered mental status, unspecified altered mental status type [R41.82] Patient Active Problem List   Diagnosis Date Noted  . Encephalopathy 12/10/2019  . Unresponsive episode 08/25/2019  . Palliative care patient 07/03/2019  . Visual hallucinations 07/03/2019  . Fecal impaction ([place]) 06/23/2019  . DNR (do not resuscitate) 05/28/2019  . Chronic constipation with overflow incontinence 02/02/2019  . Slurred speech 08/27/2018  . Chronic diastolic CHF (congestive heart failure) ([place]) 04/30/2018  . Left spastic hemiparesis ([place]) 04/30/2018  . Hypertension with goal blood pressure less than 120/80   . Dysphagia, post-stroke   . History of hemorrhagic stroke with residual hemiparesis ([place]) 04/04/2018  . Mixed Alzheimer's and vascular dementia (  [place]) 06/17/2017  . TIA (transient ischemic attack) 04/08/2016  . Thrombocytopenia ([place]) 04/08/2016  . General weakness 11/11/2014  . Advanced care planning/counseling discussion 09/16/2014  . Nocturia 09/16/2014  . Mass of submandibular region   . History of stroke with residual deficit   . 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 Chest pressure 11/19/2013  . S/P CABG (coronary artery bypass graft) 11/19/2013  . Chronic kidney disease,  stage 3   . Aortic stenosis   . Medicare annual wellness visit, subsequent 08/27/2012  . Vitamin D deficiency 08/17/2012  . Dyspnea 06/26/2011  . Erectile dysfunction 09/18/2010  . Carotid artery stenosis 12/01/2009  . UNSPECIFIED SUBJECTIVE VISUAL DISTURBANCE 05/01/2009  . BACK PAIN, LUMBAR 10/24/2008  . Chronic lymphocytic leukemia, Rai stage 0 ([place]) 04/02/2007    Class: Chronic  . DERMATITIS, SEBORRHEIC NOS 12/16/2006  . GENITAL HERPES 12/04/2006  . Mixed hyperlipidemia 12/04/2006  . Essential hypertension 12/04/2006  . Coronary atherosclerosis 12/04/2006  . GERD 12/04/2006  . Prediabetes 12/04/2006  . RENAL CALCULUS, HX OF 12/04/2006   PCP:  Ria Bush, MD Pharmacy:   [place], [place] [address] [place] [address] Phone: [phone] Fax: [id number] [place], [place]  [address] [place] [address] Phone: [phone] Fax: [phone]     Social Determinants of Health (SDOH) Interventions    Readmission Risk Interventions No flowsheet data found.

## 2019-12-13 NOTE — Progress Notes (Signed)
[facility]ufacturing [facility] hospital Liaison note:  New referral for [facility] hospice services at home received from [facility]. Patient information sent to referral. Hospice eligibility confirmed with hospice MD Dr. Jewel Baize.  Writer met with Mrs. Krack to initiate education regarding hospice services, philosophy and team approach to care with understanding voiced. Questions answered, emotional support given. No DME needs at this time.  Hospital care team updated. Will continue to follow thorough discharge. Thank you for the opportunity to be involved in the care of this patient and his family. Flo Shanks BSN, RN, [place] [phone]

## 2019-12-14 ENCOUNTER — Other Ambulatory Visit: Payer: Self-pay | Admitting: Family Medicine

## 2019-12-14 DIAGNOSIS — R627 Adult failure to thrive: Secondary | ICD-10-CM

## 2019-12-14 LAB — BASIC METABOLIC PANEL
Anion gap: 8 (ref 5–15)
BUN: 24 mg/dL — ABNORMAL HIGH (ref 8–23)
CO2: 29 mmol/L (ref 22–32)
Calcium: 9.3 mg/dL (ref 8.9–10.3)
Chloride: 101 mmol/L (ref 98–111)
Creatinine, Ser: 0.8 mg/dL (ref 0.61–1.24)
GFR calc Af Amer: >60 mL/min (ref >60)
GFR calc non Af Amer: >60 mL/min (ref >60)
Glucose, Bld: 116 mg/dL — ABNORMAL HIGH (ref 70–99)
Potassium: 3.3 mmol/L — ABNORMAL LOW (ref 3.5–5.1)
Sodium: 138 mmol/L (ref 135–145)

## 2019-12-14 LAB — CBC
HCT: 39.8 % (ref 39.0–52.0)
Hemoglobin: 13.7 g/dL (ref 13.0–17.0)
MCH: 31.4 pg (ref 26.0–34.0)
MCHC: 34.4 g/dL (ref 30.0–36.0)
MCV: 91.1 fL (ref 80.0–100.0)
Platelets: 147 10*3/uL — ABNORMAL LOW (ref 150–400)
RBC: 4.37 MIL/uL (ref 4.22–5.81)
RDW: 13.1 % (ref 11.5–15.5)
WBC: 5.9 10*3/uL (ref 4.0–10.5)
nRBC: 0 % (ref 0.0–0.2)

## 2019-12-14 MED ORDER — POTASSIUM CHLORIDE CRYS ER 20 MEQ PO TBCR
40.0000 meq | EXTENDED_RELEASE_TABLET | Freq: Once | ORAL | Status: AC
  Administered 2019-12-14: 40 meq via ORAL
  Filled 2019-12-14: qty 2

## 2019-12-14 NOTE — Discharge Summary (Addendum)
Physician Discharge Summary  Jimmy [name] XVQ:[id number] DOB: 01-25-1929 DOA: 12/09/2019  PCP: Ria Bush, MD  Admit date: 12/09/2019 Discharge date: 12/14/2019  Admitted From: home Disposition:  Home w/ hospice  Recommendations for Outpatient Follow-up:  1. Follow up w/ hospice provider as soon as possible  Home Health: home w/ hospice Equipment/Devices:  Discharge Condition: hospice CODE STATUS: DNR Diet recommendation: as tolerated    Brief/Interim Summary: HPI was taken from Dr. Sidney Ace: Jimmy [name]  is a 86 y.o. Caucasian male with a known history of coronary artery disease status post CABG, peripheral vascular disease, diastolic CHF, hypertension, dyslipidemia and gout, who presented to the emergency room with acute onset of altered mental status since Saturday with more confusion, staring and decreased responsiveness without witnessed shaking or seizures.  He denied any headache or dizziness or blurred vision.  He has chronic tinnitus and denies any vertigo.  No paresthesias or focal muscle weakness.  Upon presentation to the emergency room, vital signs were within normal.  Labs revealed a BUN of 39 creatinine 1.37, high-sensitivity troponin I of 445 and later CBC showed no leukocytosis of 10.8.  Noncontrasted head CT scan revealed atrophy and chronic microvascular disease with no acute intracranial normalities.  Brain MRI showed moderately advanced age-related cerebral atrophy with chronic small vessel ischemic disease and multiple remote lacunar infarcts involving both basal ganglia, thalami and pons with no acute intracranial normality. EKG showed normal sinus rhythm with rate of 85 with Q waves anteriorly and inferiorly.  The patient will be admitted to a medical monitored observation bed for further evaluation and management.  Hospital Course from Dr. Lenise Herald 12/10/19-12/14/19: Pt presented w/ encephalopathy of unknown etiology. Pt has dementia and it was likely  becoming progressively worse. Infection work-up was neg including blood cxs which were NGTD, urine cx showed a contaminant, no fevers, normal WBC, neg CXR & no acute intracranial findings on CT brain & MRI brain. EEG was done as well no seizure or epileptiform discharges noted either. Pt's wife wanted to proceed with hospice as pt's has continue to decline functionally and mentally over the last month. Pt was d/c home w/ hospice. Please see other progress notes for more information.   Discharge Diagnoses:  Active Problems:   TIA (transient ischemic attack)   Encephalopathy  Acute encephalopathy: etiology unclear, possibly worsening dementia vs infection. Unchanged from day prior. CT brain shows atrophy & no acute intracranial findings. MRI shows no acute intracranial findings, but multiple remote lacunar infarcts of b/l basal ganglia, thalami & pons.  Urine cx growing multiple species, likely contaminant.  Blood cx NGTD. Continue w/ neuro checks. Continue on aspirin. PT/OT recs SNF. EEG shows no seizures or epileptiform discharges  Dementia: will continue on home dose of donepezil   Failure to thrive: declining functional & mental status for at least the last month. Will go home w/ hospice   Elevated troponins: likely secondary to demand ischemia as per cardio. NSTEMI/ACS  r/o.  Echo shows EF 09-32%, grade I diastolic function, no wall motion abnormalities, & mild aortic stensosi. Continue on aspirin. Continue on tele. Cardio following and recs apprec   Hypokalemia: KCl repleted. Will continue to monitor   HTN: uncontrolled. Continue on carvedilol , lisinopril   HLD: continue on home dose of statin  AKI: resolved   Gout: will continue on home dose of allopurinol   Thrombocytopenia: labile. Will continue to monitor  Discharge Instructions  Discharge Instructions    Diet - low sodium heart healthy  Complete by: As directed    Discharge instructions   Complete by: As directed    F/u  w/ hospice provider as soon as possible   Increase activity slowly   Complete by: As directed      Allergies as of 12/14/2019      Reactions   Collodion Other (See Comments)   Unknown reaction   New Skin Other (See Comments)   Unknown reaction   Tape Dermatitis      Medication List    TAKE these medications   acetaminophen 325 MG tablet Commonly known as: TYLENOL Take 2 tablets (650 mg total) by mouth every 4 (four) hours as needed for mild pain (or temp > 37.5 C (99.5 F)). What changed: Another medication with the same name was removed. Continue taking this medication, and follow the directions you see here.   allopurinol 300 MG tablet Commonly known as: ZYLOPRIM TAKE 1 TABLET DAILY What changed: when to take this   aspirin 325 MG EC tablet Take 1 tablet (325 mg total) by mouth daily.   diphenhydrAMINE 25 MG tablet Commonly known as: Benadryl Allergy Take 1 tablet (25 mg total) by mouth at bedtime as needed for sleep. What changed:   how much to take  when to take this   donepezil 5 MG tablet Commonly known as: ARICEPT Take 5 mg by mouth daily.   gentamicin cream 0.1 % Commonly known as: GARAMYCIN Apply 1 application topically 2 (two) times daily. What changed:   when to take this  additional instructions   lisinopril 5 MG tablet Commonly known as: ZESTRIL TAKE 1 TABLET DAILY (NEW DOSE) What changed: See the new instructions.   nitroGLYCERIN 0.4 MG SL tablet Commonly known as: NITROSTAT Place 1 tablet (0.4 mg total) under the tongue every 5 (five) minutes as needed for chest pain (max 3 doses in 15 minutes).   simvastatin 20 MG tablet Commonly known as: ZOCOR Take 1 tablet (20 mg total) by mouth at bedtime.   valACYclovir 500 MG tablet Commonly known as: VALTREX TAKE 1 TABLET DAILY   VICKS DAYQUIL COLD & FLU PO Take 15 mLs by mouth daily as needed (cough).   vitamin B-12 1000 MCG tablet Commonly known as: CYANOCOBALAMIN Take 1 tablet (1,000  mcg total) by mouth daily.   VITAMIN D PO Take 1 tablet by mouth daily.       Allergies  Allergen Reactions  . Collodion Other (See Comments)    Unknown reaction  . New Skin Other (See Comments)    Unknown reaction  . Tape Dermatitis    Consultations:  Cardio  Hospice    Procedures/Studies: EEG  Result Date: 12/13/2019 Lora Havens, MD     12/13/2019  5:10 PM Patient Name: Jimmy [name] MRN: [id number] Epilepsy Attending: Lora Havens Referring Physician/Provider: Dr Kerney Elbe Date: 12/13/2019 Duration: 26.41 mins Patient history: 86 year old male with mild dementia and gradually worsening cognitive impairment x 1 year, presenting to the ED with acute AMS. EEG to evaluate for seizure. Level of alertness: Awake AEDs during EEG study: None Technical aspects: This EEG study was done with scalp electrodes positioned according to the 10-20 International system of electrode placement. Electrical activity was acquired at a sampling rate of 500Hz  and reviewed with a high frequency filter of 70Hz  and a low frequency filter of 1Hz . EEG data were recorded continuously and digitally stored. Description: The posterior dominant rhythm consists of 9 Hz activity of moderate voltage (25-35 uV)  seen predominantly in posterior head regions, symmetric and reactive to eye opening and eye closing. EEG showed intermittent generalized and maximal left temporal region3 to 6 Hz theta-delta slowing.  Physiologic photic driving was not seen during photic stimulation.  Hyperventilation was not performed.   ABNORMALITY -Intermittent slow, generalized and maximal left temporal rgion IMPRESSION: This study is suggestive of non specific cortical dysfunction in left temporal region as well as mild diffuse encephalopathy, nonspecific etiology. No seizures or epileptiform discharges were seen throughout the recording. Lora Havens   CT Head Wo Contrast  Result Date: 12/09/2019 CLINICAL DATA:  Mental status  changes EXAM: CT HEAD WITHOUT CONTRAST TECHNIQUE: Contiguous axial images were obtained from the base of the skull through the vertex without intravenous contrast. COMPARISON:  12/04/2019 FINDINGS: Brain: There is atrophy and chronic small vessel disease changes. Old bilateral basal ganglia lacunar infarcts. No acute intracranial abnormality. Specifically, no hemorrhage, hydrocephalus, mass lesion, acute infarction, or significant intracranial injury. Vascular: No hyperdense vessel or unexpected calcification. Skull: No acute calvarial abnormality. Sinuses/Orbits: Visualized paranasal sinuses and mastoids clear. Orbital soft tissues unremarkable. Other: None IMPRESSION: Atrophy, chronic microvascular disease. No acute intracranial abnormality. Electronically Signed   By: Rolm Baptise M.D.   On: 12/09/2019 20:20   CT Head Wo Contrast  Result Date: 12/04/2019 CLINICAL DATA:  Transient ischemic attack, remote cerebral infarct EXAM: CT HEAD WITHOUT CONTRAST TECHNIQUE: Contiguous axial images were obtained from the base of the skull through the vertex without intravenous contrast. COMPARISON:  None. FINDINGS: Brain: Mild parenchymal volume loss is commensurate with the patient's age. Extensive, confluent bilateral periventricular and subcortical white matter changes are present likely reflecting the sequela of small vessel ischemia. Multiple remote lacunar infarcts are noted within the basal ganglia bilaterally and thalami bilaterally. Remote infarcts noted within the pons bilaterally. No abnormal intra or extra-axial mass lesion or fluid collection. No abnormal mass effect or midline shift. No evidence of acute intracranial hemorrhage or infarct. Ventricular size is normal. Cerebellum unremarkable. Vascular: Unremarkable Skull: Intact Sinuses/Orbits: Paranasal sinuses are clear. Orbits are unremarkable. Other: Mastoid air cells and middle ear cavities are clear. IMPRESSION: 1. No acute intracranial abnormality. 2.  Extensive bilateral periventricular and subcortical white matter changes likely reflecting the sequela of small vessel ischemia. 3. Multiple remote lacunar infarcts as above. Electronically Signed   By: Fidela Salisbury MD   On: 12/04/2019 22:23   MR BRAIN WO CONTRAST  Result Date: 12/10/2019 CLINICAL DATA:  Initial evaluation for acute altered mental status. EXAM: MRI HEAD WITHOUT CONTRAST TECHNIQUE: Multiplanar, multiecho pulse sequences of the brain and surrounding structures were obtained without intravenous contrast. COMPARISON:  Prior CT from earlier same day. FINDINGS: Brain: Examination moderately degraded by motion artifact. Diffuse prominence of the CSF containing spaces compatible with moderately advanced age-related cerebral atrophy. Patchy and confluent T2/FLAIR hyperintensity within the periventricular and deep white matter both cerebral hemispheres most consistent with chronic small vessel ischemic disease, moderate in nature. Mild patchy involvement of the pons. Multiple scattered remote lacunar infarcts present about the bilateral basal ganglia, thalami, and pons. Associated chronic hemosiderin staining noted about a chronic lacunar infarct at the right lentiform nucleus. Probable additional tiny chronic remote right parietal infarct (series 15, image 42). No abnormal foci of restricted diffusion to suggest acute or subacute ischemia. Gray-white matter differentiation maintained. No evidence for acute intracranial hemorrhage. No mass lesion, midline shift or mass effect. Diffuse ventricular prominence related global parenchymal volume loss without hydrocephalus. No extra-axial fluid collection. Pituitary gland suprasellar  region within normal limits. Midline structures intact. Vascular: Major intracranial vascular flow voids are grossly maintained at the skull base. Skull and upper cervical spine: Craniocervical junction within normal limits. Bone marrow signal intensity normal. No scalp soft  tissue abnormality. Sinuses/Orbits: Patient status post bilateral ocular lens replacement. Paranasal sinuses are largely clear. Small right mastoid effusion noted, of doubtful significance. Other: None. IMPRESSION: 1. No acute intracranial abnormality. 2. Moderately advanced age-related cerebral atrophy with chronic small vessel ischemic disease with multiple remote lacunar infarcts involving the bilateral basal ganglia, thalami, and pons. Electronically Signed   By: Jeannine Boga M.D.   On: 12/10/2019 00:08   US Carotid Bilateral (at [facility] and [facility] only)  Result Date: 12/10/2019 CLINICAL DATA:  86 year old male with TIA EXAM: BILATERAL CAROTID DUPLEX ULTRASOUND TECHNIQUE: Pearline Cables scale imaging, color Doppler and duplex ultrasound were performed of bilateral carotid and vertebral arteries in the neck. COMPARISON:  None. FINDINGS: Criteria: Quantification of carotid stenosis is based on velocity parameters that correlate the residual internal carotid diameter with NASCET-based stenosis levels, using the diameter of the distal internal carotid lumen as the denominator for stenosis measurement. The following velocity measurements were obtained: RIGHT ICA:  Systolic 438 cm/sec, Diastolic 28 cm/sec CCA:  94 cm/sec SYSTOLIC ICA/CCA RATIO:  1.6 ECA:  354 cm/sec LEFT ICA:  Systolic 887 cm/sec, Diastolic 21 cm/sec CCA:  58 cm/sec SYSTOLIC ICA/CCA RATIO:  1.8 ECA:  91 cm/sec Right Brachial SBP: Not acquired Left Brachial SBP: Not acquired RIGHT CAROTID ARTERY: No significant calcifications of the right common carotid artery. Intermediate waveform maintained. Moderate heterogeneous and partially calcified plaque at the right carotid bifurcation. No significant lumen shadowing. Low resistance waveform of the right ICA. No significant tortuosity. RIGHT VERTEBRAL ARTERY: Antegrade flow with low resistance waveform. LEFT CAROTID ARTERY: No significant calcifications of the left common carotid artery. Intermediate waveform  maintained. Moderate heterogeneous and partially calcified plaque at the left carotid bifurcation. No significant lumen shadowing. Low resistance waveform of the left ICA. No significant tortuosity. LEFT VERTEBRAL ARTERY:  Antegrade flow with low resistance waveform. IMPRESSION: Right: Heterogeneous and partially calcified plaque at the right carotid bifurcation, with discordant results regarding degree of stenosis by established duplex criteria. Peak velocity suggests 50%-69% stenosis, with the ICA/ CCA ratio suggesting a lesser degree of stenosis. If establishing a more accurate degree of stenosis is required, cerebral angiogram should be considered, or as a second best test, CTA. Left: Color duplex indicates moderate heterogeneous and calcified plaque, with no hemodynamically significant stenosis by duplex criteria in the extracranial cerebrovascular circulation. Signed, Dulcy Fanny. Dellia Nims, RPVI [facility] Electronically Signed   By: Corrie Mckusick D.O.   On: 12/10/2019 11:35   DG Chest Portable 1 View  Result Date: 12/04/2019 CLINICAL DATA:  Cough EXAM: PORTABLE CHEST 1 VIEW COMPARISON:  04/04/2018 FINDINGS: Prior CABG. Heart and mediastinal contours are within normal limits. No focal opacities or effusions. No acute bony abnormality. IMPRESSION: No active disease. Electronically Signed   By: Rolm Baptise M.D.   On: 12/04/2019 21:51   ECHOCARDIOGRAM COMPLETE  Result Date: 12/10/2019    ECHOCARDIOGRAM REPORT   Patient Name:   TAJAE RYBICKI [facility] Date of Exam: 12/10/2019 Medical Rec #:  [id number]        Height:       69.0 in Accession #:    [id number]       Weight:       150.0 lb Date of Birth:  25-Dec-1928  BSA:          1.828 m Patient Age:    3 years         BP:           173/76 mmHg Patient Gender: M                HR:           89 bpm. Exam Location:  [facility] Procedure: 2D Echo, Color Doppler and Cardiac Doppler Indications:     G45.9 TIA   History:         Patient has prior history of Echocardiogram examinations. CHF,                  CAD, Stroke; Risk Factors:Hypertension and Dyslipidemia.  Sonographer:     Charmayne Sheer RDCS (AE) Referring Phys:  [id number] Arvella Merles MANSY Diagnosing Phys: Ida Rogue MD  Sonographer Comments: Suboptimal apical window and suboptimal subcostal window. Image acquisition challenging due to uncooperative patient and Image acquisition challenging due to respiratory motion. IMPRESSIONS  1. Left ventricular ejection fraction, by estimation, is 60 to 65%. The left ventricle has normal function. The left ventricle has no regional wall motion abnormalities. Left ventricular diastolic parameters are consistent with Grade I diastolic dysfunction (impaired relaxation).  2. Right ventricular systolic function is normal. The right ventricular size is normal.  3. Mild aortic valve stenosis. FINDINGS  Left Ventricle: Left ventricular ejection fraction, by estimation, is 60 to 65%. The left ventricle has normal function. The left ventricle has no regional wall motion abnormalities. The left ventricular internal cavity size was normal in size. There is  no left ventricular hypertrophy. Left ventricular diastolic parameters are consistent with Grade I diastolic dysfunction (impaired relaxation). Right Ventricle: The right ventricular size is normal. No increase in right ventricular wall thickness. Right ventricular systolic function is normal. Left Atrium: Left atrial size was normal in size. Right Atrium: Right atrial size was normal in size. Pericardium: There is no evidence of pericardial effusion. Mitral Valve: The mitral valve is normal in structure. No evidence of mitral valve regurgitation. No evidence of mitral valve stenosis. MV peak gradient, 8.8 mmHg. The mean mitral valve gradient is 2.0 mmHg. Tricuspid Valve: The tricuspid valve is normal in structure. Tricuspid valve regurgitation is not demonstrated. No evidence of tricuspid  stenosis. Aortic Valve: The aortic valve is normal in structure. Aortic valve regurgitation is not visualized. Mild aortic stenosis is present. Aortic valve mean gradient measures 7.0 mmHg. Aortic valve peak gradient measures 14.3 mmHg. Aortic valve area, by VTI measures 1.48 cm. Pulmonic Valve: The pulmonic valve was normal in structure. Pulmonic valve regurgitation is not visualized. No evidence of pulmonic stenosis. Aorta: The aortic root is normal in size and structure. Venous: The inferior vena cava is normal in size with greater than 50% respiratory variability, suggesting right atrial pressure of 3 mmHg. IAS/Shunts: No atrial level shunt detected by color flow Doppler.  LEFT VENTRICLE PLAX 2D LVIDd:         3.26 cm  Diastology LVIDs:         2.09 cm  LV e' medial:    3.81 cm/s LV PW:         1.10 cm  LV E/e' medial:  15.9 LV IVS:        0.98 cm  LV e' lateral:   8.16 cm/s LVOT diam:     2.10 cm  LV E/e' lateral: 7.4 LV SV:  61 LV SV Index:   33 LVOT Area:     3.46 cm  LEFT ATRIUM           Index LA diam:      2.40 cm 1.31 cm/m LA Vol (A4C): 25.4 ml 13.89 ml/m  AORTIC VALVE                    PULMONIC VALVE AV Area (Vmax):    1.57 cm     PV Vmax:       1.15 m/s AV Area (Vmean):   1.55 cm     PV Vmean:      76.600 cm/s AV Area (VTI):     1.48 cm     PV VTI:        0.204 m AV Vmax:           189.00 cm/s  PV Peak grad:  5.3 mmHg AV Vmean:          130.000 cm/s PV Mean grad:  3.0 mmHg AV VTI:            0.411 m AV Peak Grad:      14.3 mmHg AV Mean Grad:      7.0 mmHg LVOT Vmax:         85.70 cm/s LVOT Vmean:        58.300 cm/s LVOT VTI:          0.176 m LVOT/AV VTI ratio: 0.43  AORTA Ao Root diam: 3.10 cm MITRAL VALVE MV Area (PHT): 4.06 cm     SHUNTS MV Peak grad:  8.8 mmHg     Systemic VTI:  0.18 m MV Mean grad:  2.0 mmHg     Systemic Diam: 2.10 cm MV Vmax:       1.48 m/s MV Vmean:      70.8 cm/s MV Decel Time: 187 msec MV E velocity: 60.60 cm/s MV A velocity: 114.00 cm/s MV E/A ratio:  0.53  Ida Rogue MD Electronically signed by Ida Rogue MD Signature Date/Time: 12/10/2019/5:12:41 PM    Final       Subjective: Pt is oriented to self only. Pt denies any pain or shortness of breath.    Discharge Exam: Vitals:   12/13/19 1626 12/14/19 0751  BP: (!) 165/65 (!) 170/69  Pulse: 67 61  Resp: 17 15  Temp: 97.6 F (36.4 C) 97.9 F (36.6 C)  SpO2: 100% 100%   Vitals:   12/13/19 0302 12/13/19 0759 12/13/19 1626 12/14/19 0751  BP: 128/63 (!) 175/71 (!) 165/65 (!) 170/69  Pulse: 66 (!) 55 67 61  Resp: 16 15 17 15   Temp: (!) 97.4 F (36.3 C) 97.6 F (36.4 C) 97.6 F (36.4 C) 97.9 F (36.6 C)  TempSrc: Oral Oral  Oral  SpO2: 97% 99% 100% 100%  Weight:      Height:        General: Pt is alert, awake, not in acute distress Cardiovascular:  S1/S2 +, no rubs, no gallops Respiratory: decreased breath sounds b/l  Abdominal: Soft, NT, ND, bowel sounds + Extremities: no edema, no cyanosis    The results of significant diagnostics from this hospitalization (including imaging, microbiology, ancillary and laboratory) are listed below for reference.     Microbiology: Recent Results (from the past 240 hour(s))  SARS Coronavirus 2 by RT PCR (hospital order, performed in [facility] hospital lab) Nasopharyngeal Nasopharyngeal Swab     Status: None   Collection Time: 12/04/19  9:49 PM  Specimen: Nasopharyngeal Swab  Result Value Ref Range Status   SARS Coronavirus 2 NEGATIVE NEGATIVE Final    Comment: (NOTE) SARS-CoV-2 target nucleic acids are NOT DETECTED.  The SARS-CoV-2 RNA is generally detectable in upper and lower respiratory specimens during the acute phase of infection. The lowest concentration of SARS-CoV-2 viral copies this assay can detect is 250 copies / mL. A negative result does not preclude SARS-CoV-2 infection and should not be used as the sole basis for treatment or other patient management decisions.  A negative result may occur with improper  specimen collection / handling, submission of specimen other than nasopharyngeal swab, presence of viral mutation(s) within the areas targeted by this assay, and inadequate number of viral copies (<250 copies / mL). A negative result must be combined with clinical observations, patient history, and epidemiological information.  Fact Sheet for Patients:   StrictlyIdeas.no  Fact Sheet for Healthcare Providers: BankingDealers.co.za  This test is not yet approved or  cleared by the [place] FDA and has been authorized for detection and/or diagnosis of SARS-CoV-2 by FDA under an Emergency Use Authorization (EUA).  This EUA will remain in effect (meaning this test can be used) for the duration of the COVID-19 declaration under Section 564(b)(1) of the Act, 21 U.S.C. section 360bbb-3(b)(1), unless the authorization is terminated or revoked sooner.  Performed at [facility], [place] [address] [place], [place] [address]   SARS Coronavirus 2 by RT PCR (hospital order, performed in [facility] hospital lab) Nasopharyngeal Nasopharyngeal Swab     Status: None   Collection Time: 12/10/19  4:03 AM   Specimen: Nasopharyngeal Swab  Result Value Ref Range Status   SARS Coronavirus 2 NEGATIVE NEGATIVE Final    Comment: (NOTE) SARS-CoV-2 target nucleic acids are NOT DETECTED.  The SARS-CoV-2 RNA is generally detectable in upper and lower respiratory specimens during the acute phase of infection. The lowest concentration of SARS-CoV-2 viral copies this assay can detect is 250 copies / mL. A negative result does not preclude SARS-CoV-2 infection and should not be used as the sole basis for treatment or other patient management decisions.  A negative result may occur with improper specimen collection / handling, submission of specimen other than nasopharyngeal swab, presence of viral mutation(s) within the areas targeted by this assay, and  inadequate number of viral copies (<250 copies / mL). A negative result must be combined with clinical observations, patient history, and epidemiological information.  Fact Sheet for Patients:   StrictlyIdeas.no  Fact Sheet for Healthcare Providers: BankingDealers.co.za  This test is not yet approved or  cleared by the [place] FDA and has been authorized for detection and/or diagnosis of SARS-CoV-2 by FDA under an Emergency Use Authorization (EUA).  This EUA will remain in effect (meaning this test can be used) for the duration of the COVID-19 declaration under Section 564(b)(1) of the Act, 21 U.S.C. section 360bbb-3(b)(1), unless the authorization is terminated or revoked sooner.  Performed at [facility], [address], [place], [place] [address]   Urine Culture     Status: Abnormal   Collection Time: 12/10/19  6:32 AM   Specimen: Urine, Random  Result Value Ref Range Status   Specimen Description   Final    URINE, RANDOM Performed at [facility] [address] [place], [place] [address]    Special Requests   Final    NONE Performed at [facility], [address] [place], [place] [address]  Culture MULTIPLE SPECIES PRESENT, SUGGEST RECOLLECTION (A)  Final   Report Status 12/12/2019 FINAL  Final  CULTURE, BLOOD (ROUTINE X 2) w Reflex to ID Panel     Status: None (Preliminary result)   Collection Time: 12/10/19  3:27 PM   Specimen: BLOOD  Result Value Ref Range Status   Specimen Description BLOOD BLOOD RIGHT WRIST  Final   Special Requests   Final    BOTTLES DRAWN AEROBIC AND ANAEROBIC Blood Culture adequate volume   Culture   Final    NO GROWTH 4 DAYS Performed at [facility], [address], [place], [place] [address]    Report Status PENDING  Incomplete  CULTURE, BLOOD (ROUTINE X 2) w Reflex to ID Panel     Status: None (Preliminary result)   Collection Time:  12/10/19  3:39 PM   Specimen: BLOOD  Result Value Ref Range Status   Specimen Description BLOOD BLOOD LEFT HAND  Final   Special Requests   Final    BOTTLES DRAWN AEROBIC AND ANAEROBIC Blood Culture adequate volume   Culture   Final    NO GROWTH 4 DAYS Performed at [facility], [address] [place], [place] [address]    Report Status PENDING  Incomplete     Labs: BNP (last 3 results) No results for input(s): BNP in the last 8760 hours. Basic Metabolic Panel: Recent Labs  Lab 12/10/19 1131 12/11/19 0935 12/12/19 0728 12/13/19 0334 12/14/19 0257  NA 140 137 138 137 138  K 3.4* 3.7 4.5 3.8 3.3*  CL 104 103 101 101 101  CO2 24 27 29 28 29   GLUCOSE 141* 98 110* 131* 116*  BUN 37* 29* 30* 31* 24*  CREATININE 1.03 0.79 1.04 1.16 0.80  CALCIUM 9.2 9.4 9.8 9.2 9.3  MG 2.1  --   --   --   --    Liver Function Tests: Recent Labs  Lab 12/09/19 1959  AST 28  ALT 27  ALKPHOS 110  BILITOT 1.2  PROT 7.5  ALBUMIN 4.3   No results for input(s): LIPASE, AMYLASE in the last 168 hours. No results for input(s): AMMONIA in the last 168 hours. CBC: Recent Labs  Lab 12/09/19 1959 12/09/19 1959 12/10/19 1131 12/11/19 0935 12/12/19 0728 12/13/19 0334 12/14/19 0257  WBC 10.8*   < > 6.1 7.2 6.1 6.7 5.9  NEUTROABS 8.1*  --   --   --   --   --   --   HGB 15.3   < > 13.2 13.9 13.6 12.3* 13.7  HCT 44.6   < > 36.1* 40.1 39.3 35.3* 39.8  MCV 91.8   < > 87.4 90.7 89.9 90.3 91.1  PLT 211   < > 149* 159 162 150 147*   < > = values in this interval not displayed.   Cardiac Enzymes: No results for input(s): CKTOTAL, CKMB, CKMBINDEX, TROPONINI in the last 168 hours. BNP: Invalid input(s): POCBNP CBG: No results for input(s): GLUCAP in the last 168 hours. D-Dimer No results for input(s): DDIMER in the last 72 hours. Hgb A1c No results for input(s): HGBA1C in the last 72 hours. Lipid Profile No results for input(s): CHOL, HDL, LDLCALC, TRIG, CHOLHDL, LDLDIRECT in the  last 72 hours. Thyroid function studies No results for input(s): TSH, T4TOTAL, T3FREE, THYROIDAB in the last 72 hours.  Invalid input(s): FREET3 Anemia work up No results for input(s): VITAMINB12, FOLATE, FERRITIN, TIBC, IRON, RETICCTPCT in the last 72 hours. Urinalysis  Component Value Date/Time   COLORURINE AMBER (A) 12/10/2019 0632   APPEARANCEUR HAZY (A) 12/10/2019 0632   LABSPEC 1.028 12/10/2019 0632   PHURINE 5.0 12/10/2019 0632   GLUCOSEU NEGATIVE 12/10/2019 0632   HGBUR NEGATIVE 12/10/2019 0632   HGBUR small 12/21/2008 0841   BILIRUBINUR NEGATIVE 12/10/2019 0632   BILIRUBINUR negative 09/25/2017 0855   KETONESUR 5 (A) 12/10/2019 0632   PROTEINUR 30 (A) 12/10/2019 0632   UROBILINOGEN 0.2 09/25/2017 0855   UROBILINOGEN 0.2 12/21/2008 0841   NITRITE NEGATIVE 12/10/2019 0632   LEUKOCYTESUR NEGATIVE 12/10/2019 6759   Sepsis Labs Invalid input(s): PROCALCITONIN,  WBC,  LACTICIDVEN Microbiology Recent Results (from the past 240 hour(s))  SARS Coronavirus 2 by RT PCR (hospital order, performed in [place] hospital lab) Nasopharyngeal Nasopharyngeal Swab     Status: None   Collection Time: 12/04/19  9:49 PM   Specimen: Nasopharyngeal Swab  Result Value Ref Range Status   SARS Coronavirus 2 NEGATIVE NEGATIVE Final    Comment: (NOTE) SARS-CoV-2 target nucleic acids are NOT DETECTED.  The SARS-CoV-2 RNA is generally detectable in upper and lower respiratory specimens during the acute phase of infection. The lowest concentration of SARS-CoV-2 viral copies this assay can detect is 250 copies / mL. A negative result does not preclude SARS-CoV-2 infection and should not be used as the sole basis for treatment or other patient management decisions.  A negative result may occur with improper specimen collection / handling, submission of specimen other than nasopharyngeal swab, presence of viral mutation(s) within the areas targeted by this assay, and inadequate number of  viral copies (<250 copies / mL). A negative result must be combined with clinical observations, patient history, and epidemiological information.  Fact Sheet for Patients:   StrictlyIdeas.no  Fact Sheet for Healthcare Providers: BankingDealers.co.za  This test is not yet approved or  cleared by the [place] FDA and has been authorized for detection and/or diagnosis of SARS-CoV-2 by FDA under an Emergency Use Authorization (EUA).  This EUA will remain in effect (meaning this test can be used) for the duration of the COVID-19 declaration under Section 564(b)(1) of the Act, 21 U.S.C. section 360bbb-3(b)(1), unless the authorization is terminated or revoked sooner.  Performed at [facility], [place] [address] [place], [place] [address]   SARS Coronavirus 2 by RT PCR (hospital order, performed in [facility] hospital lab) Nasopharyngeal Nasopharyngeal Swab     Status: None   Collection Time: 12/10/19  4:03 AM   Specimen: Nasopharyngeal Swab  Result Value Ref Range Status   SARS Coronavirus 2 NEGATIVE NEGATIVE Final    Comment: (NOTE) SARS-CoV-2 target nucleic acids are NOT DETECTED.  The SARS-CoV-2 RNA is generally detectable in upper and lower respiratory specimens during the acute phase of infection. The lowest concentration of SARS-CoV-2 viral copies this assay can detect is 250 copies / mL. A negative result does not preclude SARS-CoV-2 infection and should not be used as the sole basis for treatment or other patient management decisions.  A negative result may occur with improper specimen collection / handling, submission of specimen other than nasopharyngeal swab, presence of viral mutation(s) within the areas targeted by this assay, and inadequate number of viral copies (<250 copies / mL). A negative result must be combined with clinical observations, patient history, and epidemiological information.  Fact Sheet for  Patients:   StrictlyIdeas.no  Fact Sheet for Healthcare Providers: BankingDealers.co.za  This test is not yet approved or  cleared by the [place]  FDA and has been authorized for detection and/or diagnosis of SARS-CoV-2 by FDA under an Emergency Use Authorization (EUA).  This EUA will remain in effect (meaning this test can be used) for the duration of the COVID-19 declaration under Section 564(b)(1) of the Act, 21 U.S.C. section 360bbb-3(b)(1), unless the authorization is terminated or revoked sooner.  Performed at [facility], [address], [place], [place] [address]   Urine Culture     Status: Abnormal   Collection Time: 12/10/19  6:32 AM   Specimen: Urine, Random  Result Value Ref Range Status   Specimen Description   Final    URINE, RANDOM Performed at [facility], [address], [place], [place] [address]    Special Requests   Final    NONE Performed at [facility], [address] [place], [place] [address]    Culture MULTIPLE SPECIES PRESENT, SUGGEST RECOLLECTION (A)  Final   Report Status 12/12/2019 FINAL  Final  CULTURE, BLOOD (ROUTINE X 2) w Reflex to ID Panel     Status: None (Preliminary result)   Collection Time: 12/10/19  3:27 PM   Specimen: BLOOD  Result Value Ref Range Status   Specimen Description BLOOD BLOOD RIGHT WRIST  Final   Special Requests   Final    BOTTLES DRAWN AEROBIC AND ANAEROBIC Blood Culture adequate volume   Culture   Final    NO GROWTH 4 DAYS Performed at [facility], [address] [place], [place] [address]    Report Status PENDING  Incomplete  CULTURE, BLOOD (ROUTINE X 2) w Reflex to ID Panel     Status: None (Preliminary result)   Collection Time: 12/10/19  3:39 PM   Specimen: BLOOD  Result Value Ref Range Status   Specimen Description BLOOD BLOOD LEFT HAND  Final   Special Requests   Final    BOTTLES DRAWN AEROBIC AND ANAEROBIC  Blood Culture adequate volume   Culture   Final    NO GROWTH 4 DAYS Performed at [facility], [address], [place], [place] [address]    Report Status PENDING  Incomplete     Time coordinating discharge: Over 30 minutes  SIGNED:   Wyvonnia Dusky, MD  [facility] 12/14/2019, 12:24 PM Pager   If 7PM-7AM, please contact night-coverage [email or web address]

## 2019-12-14 NOTE — Progress Notes (Signed)
Speech Language Pathology Treatment: Dysphagia  Patient Details Name: Jimmy [name] MRN: [id number] DOB: 1928-06-27 Today's Date: 12/14/2019 Time: 7673-4193 SLP Time Calculation (min) (ACUTE ONLY): 50 min  Assessment / Plan / Recommendation Clinical Impression  Pt appears to present w/ oropharyngeal phase dysphagia impacted by declined Cognitive awareness(baseline Dementia) and baseline oropharyngeal phase dysphagia noted in 04/2018, 07/2018. These issues increase risk for aspiration thus Pulmonary decline. Initial BSE in 04/2018 rec'd dysphagia diet w/ Nectar liquids d/t noted aspiration. A MBSS 07/2018 noted "Moderate oropharyngeal phase dysphaiga.... the patient remains at risk for prandial aspiration however risk is greatly reduced with consistent head turn Left for all swallows. The patient is able to state this rule and reports that he uses the strategy for all swallows. Recommend mechanical soft diet with thin liquids, head turn Left for all swallows". Cognitive-linguistic issues were noted 07/2018 also.  Pt continues to present w/ oropharyngeal phase dysphagia in light of declined Cognitive status, baseline Dementia, impacting his overall awareness/follow through w/ aspiration precautions which increases risk for aspiration. He requires mod tactile/verbal cues for orientation to/follow through w/ swallowing strategy of Left head turn as well as encouragement to NOT use straws when drinking. Pt consumed trials of Nectar liquids via straw/cup w/ no overt clinical s/s of aspiration noted vis Cup but coughing noted x2 when using a Straw. No overall decline in respiratory status during/post trials. Oral phase was grossly [facility] for bolus management and oral clearing of the boluses given, BUT he required increased Time as intermittent slower bolus manipulation noted w/ the increased textured foods. Thin liquids were not assessed d/t pt's Baseline Dysphagia and while eating a meal. Discussed w/ Wife that  Pleasure sips of thin liquids w/ precautions and strategy of Left head turn post Oral Care could be something she could explore w/ pt's MD, Hospice Nurse in order to address pt's QOL. Discussed monitoring of pt's cues and aspiration precautions if doing so.  Discussed diet consistency of foods/thickened liquids; preparation and ordering of thickened liquids; food options; aspiration precautions; pills Whole/Crushed in puree; supervision at meals. Handouts on the above discussed, given as well as thickened liquids and powder for d/c home. Recommend continue a Dysphagia level 3(mech soft) w/ Nectar liquids; general aspiration precautions; reduce Distractions during meals. NSG updated.     HPI HPI: Pt is a 86 y.o. male with a known history of Mixed Alzheimer's and vascular Dementia, history of hemorrhagic CVA with left-sided weakness and Dysphagia ~3 years ago(dysphagia documented in 04/2018), and multiple medical issues including CAD s/p CABG x4, carotid stenosis, CKD 3, history CLL, chronic diastolic CHF, hypertension, hyperlipidemia who presents to the ED w/ weakness and slurred speech.  MRI: "Moderately advanced age-related cerebral atrophy with chronic disease"; CXR on 12/04/2019: negative for any acute issues.       SLP Plan  Continue with current plan of care       Recommendations  Diet recommendations: Dysphagia 3 (mechanical soft);Nectar-thick liquid Liquids provided via: Cup;No straw Medication Administration: Whole meds with puree (as able for safer swallowing) Supervision: Patient able to self feed;Intermittent supervision to cue for compensatory strategies Compensations: Minimize environmental distractions;Slow rate;Small sips/bites;Lingual sweep for clearance of pocketing;Follow solids with liquid Postural Changes and/or Swallow Maneuvers: Seated upright 90 degrees;Upright 30-60 min after meal                General recommendations:  (Dietician f/u; Hospice services at home  now) Oral Care Recommendations: Oral care BID;Oral care before and after PO;Staff/trained caregiver  to provide oral care Follow up Recommendations:  (home w/ Hospice ) SLP Visit Diagnosis: Dysphagia, oropharyngeal phase (R13.12) Plan: Continue with current plan of care       GO                 Orinda Kenner, [place], [place] Pathologist Rehab Services [phone] [facility] 12/14/2019, 3:25 PM

## 2019-12-14 NOTE — Progress Notes (Signed)
PT Cancellation Note  Patient Details Name: Jimmy [name] MRN: [id number] DOB: 07-27-1928   Cancelled Treatment:    Reason Eval/Treat Not Completed: Other (comment) Staff and family members request not performing PT due to being transferred home with Hospice later this afternoon.    Gwenlyn Saran, PT, DPT 12/14/19, 11:34 AM

## 2019-12-14 NOTE — Progress Notes (Signed)
Manufacturing engineer hospital Liaison note: Follow up visit made to new referral for [facility] hospice services at home.  Patient alert, wife at bedside. Plan is for discharge today by [facility], arranged by [facility] Jhonnie Garner. Mrs. Bittick is aware and in agreement. Referral made aware.   Flo Shanks BSN, RN, [place] [phone]

## 2019-12-15 DIAGNOSIS — E785 Hyperlipidemia, unspecified: Secondary | ICD-10-CM | POA: Diagnosis not present

## 2019-12-15 DIAGNOSIS — R634 Abnormal weight loss: Secondary | ICD-10-CM | POA: Diagnosis not present

## 2019-12-15 DIAGNOSIS — M109 Gout, unspecified: Secondary | ICD-10-CM | POA: Diagnosis not present

## 2019-12-15 DIAGNOSIS — I739 Peripheral vascular disease, unspecified: Secondary | ICD-10-CM | POA: Diagnosis not present

## 2019-12-15 DIAGNOSIS — E538 Deficiency of other specified B group vitamins: Secondary | ICD-10-CM | POA: Diagnosis not present

## 2019-12-15 DIAGNOSIS — I6529 Occlusion and stenosis of unspecified carotid artery: Secondary | ICD-10-CM | POA: Diagnosis not present

## 2019-12-15 DIAGNOSIS — F0150 Vascular dementia without behavioral disturbance: Secondary | ICD-10-CM | POA: Diagnosis not present

## 2019-12-15 DIAGNOSIS — I25708 Atherosclerosis of coronary artery bypass graft(s), unspecified, with other forms of angina pectoris: Secondary | ICD-10-CM | POA: Diagnosis not present

## 2019-12-15 DIAGNOSIS — Z85828 Personal history of other malignant neoplasm of skin: Secondary | ICD-10-CM | POA: Diagnosis not present

## 2019-12-15 DIAGNOSIS — Z6820 Body mass index (BMI) 20.0-20.9, adult: Secondary | ICD-10-CM | POA: Diagnosis not present

## 2019-12-15 DIAGNOSIS — N183 Chronic kidney disease, stage 3 unspecified: Secondary | ICD-10-CM | POA: Diagnosis not present

## 2019-12-15 DIAGNOSIS — Z8673 Personal history of transient ischemic attack (TIA), and cerebral infarction without residual deficits: Secondary | ICD-10-CM | POA: Diagnosis not present

## 2019-12-15 DIAGNOSIS — D696 Thrombocytopenia, unspecified: Secondary | ICD-10-CM | POA: Diagnosis not present

## 2019-12-15 DIAGNOSIS — G4700 Insomnia, unspecified: Secondary | ICD-10-CM | POA: Diagnosis not present

## 2019-12-15 DIAGNOSIS — Z8619 Personal history of other infectious and parasitic diseases: Secondary | ICD-10-CM | POA: Diagnosis not present

## 2019-12-15 DIAGNOSIS — Z856 Personal history of leukemia: Secondary | ICD-10-CM | POA: Diagnosis not present

## 2019-12-15 DIAGNOSIS — I5032 Chronic diastolic (congestive) heart failure: Secondary | ICD-10-CM | POA: Diagnosis not present

## 2019-12-15 DIAGNOSIS — R1310 Dysphagia, unspecified: Secondary | ICD-10-CM | POA: Diagnosis not present

## 2019-12-15 DIAGNOSIS — R221 Localized swelling, mass and lump, neck: Secondary | ICD-10-CM | POA: Diagnosis not present

## 2019-12-15 DIAGNOSIS — K219 Gastro-esophageal reflux disease without esophagitis: Secondary | ICD-10-CM | POA: Diagnosis not present

## 2019-12-15 DIAGNOSIS — I130 Hypertensive heart and chronic kidney disease with heart failure and stage 1 through stage 4 chronic kidney disease, or unspecified chronic kidney disease: Secondary | ICD-10-CM | POA: Diagnosis not present

## 2019-12-15 LAB — CULTURE, BLOOD (ROUTINE X 2)
Culture: NO GROWTH
Culture: NO GROWTH
Special Requests: ADEQUATE
Special Requests: ADEQUATE
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
## 2019-12-16 DIAGNOSIS — I5032 Chronic diastolic (congestive) heart failure: Secondary | ICD-10-CM | POA: Diagnosis not present

## 2019-12-16 DIAGNOSIS — F0150 Vascular dementia without behavioral disturbance: Secondary | ICD-10-CM | POA: Diagnosis not present

## 2019-12-16 DIAGNOSIS — I130 Hypertensive heart and chronic kidney disease with heart failure and stage 1 through stage 4 chronic kidney disease, or unspecified chronic kidney disease: Secondary | ICD-10-CM | POA: Diagnosis not present

## 2019-12-16 DIAGNOSIS — I6529 Occlusion and stenosis of unspecified carotid artery: Secondary | ICD-10-CM | POA: Diagnosis not present

## 2019-12-16 DIAGNOSIS — N183 Chronic kidney disease, stage 3 unspecified: Secondary | ICD-10-CM | POA: Diagnosis not present

## 2019-12-16 DIAGNOSIS — I25708 Atherosclerosis of coronary artery bypass graft(s), unspecified, with other forms of angina pectoris: Secondary | ICD-10-CM | POA: Diagnosis not present

## 2019-12-17 DIAGNOSIS — I25708 Atherosclerosis of coronary artery bypass graft(s), unspecified, with other forms of angina pectoris: Secondary | ICD-10-CM | POA: Diagnosis not present

## 2019-12-17 DIAGNOSIS — N183 Chronic kidney disease, stage 3 unspecified: Secondary | ICD-10-CM | POA: Diagnosis not present

## 2019-12-17 DIAGNOSIS — F0150 Vascular dementia without behavioral disturbance: Secondary | ICD-10-CM | POA: Diagnosis not present

## 2019-12-17 DIAGNOSIS — I6529 Occlusion and stenosis of unspecified carotid artery: Secondary | ICD-10-CM | POA: Diagnosis not present

## 2019-12-17 DIAGNOSIS — I5032 Chronic diastolic (congestive) heart failure: Secondary | ICD-10-CM | POA: Diagnosis not present

## 2019-12-17 DIAGNOSIS — I130 Hypertensive heart and chronic kidney disease with heart failure and stage 1 through stage 4 chronic kidney disease, or unspecified chronic kidney disease: Secondary | ICD-10-CM | POA: Diagnosis not present

## 2019-12-20 ENCOUNTER — Other Ambulatory Visit: Payer: Self-pay

## 2019-12-20 ENCOUNTER — Ambulatory Visit (INDEPENDENT_AMBULATORY_CARE_PROVIDER_SITE_OTHER): Payer: Medicare Other | Admitting: Family Medicine

## 2019-12-20 ENCOUNTER — Encounter: Payer: Self-pay | Admitting: Family Medicine

## 2019-12-20 VITALS — BP 122/62 | HR 79 | Temp 97.9°F | Wt 139.5 lb

## 2019-12-20 DIAGNOSIS — G309 Alzheimer's disease, unspecified: Secondary | ICD-10-CM | POA: Diagnosis not present

## 2019-12-20 DIAGNOSIS — Z66 Do not resuscitate: Secondary | ICD-10-CM

## 2019-12-20 DIAGNOSIS — R531 Weakness: Secondary | ICD-10-CM | POA: Diagnosis not present

## 2019-12-20 DIAGNOSIS — F0280 Dementia in other diseases classified elsewhere without behavioral disturbance: Secondary | ICD-10-CM | POA: Diagnosis not present

## 2019-12-20 DIAGNOSIS — I69359 Hemiplegia and hemiparesis following cerebral infarction affecting unspecified side: Secondary | ICD-10-CM

## 2019-12-20 DIAGNOSIS — I10 Essential (primary) hypertension: Secondary | ICD-10-CM

## 2019-12-20 DIAGNOSIS — I5032 Chronic diastolic (congestive) heart failure: Secondary | ICD-10-CM | POA: Diagnosis not present

## 2019-12-20 DIAGNOSIS — F0150 Vascular dementia without behavioral disturbance: Secondary | ICD-10-CM

## 2019-12-20 DIAGNOSIS — Z515 Encounter for palliative care: Secondary | ICD-10-CM | POA: Diagnosis not present

## 2019-12-20 DIAGNOSIS — E782 Mixed hyperlipidemia: Secondary | ICD-10-CM

## 2019-12-20 DIAGNOSIS — C9110 Chronic lymphocytic leukemia of B-cell type not having achieved remission: Secondary | ICD-10-CM | POA: Diagnosis not present

## 2019-12-20 DIAGNOSIS — I6930 Unspecified sequelae of cerebral infarction: Secondary | ICD-10-CM | POA: Diagnosis not present

## 2019-12-20 DIAGNOSIS — I6529 Occlusion and stenosis of unspecified carotid artery: Secondary | ICD-10-CM | POA: Diagnosis not present

## 2019-12-20 DIAGNOSIS — I25708 Atherosclerosis of coronary artery bypass graft(s), unspecified, with other forms of angina pectoris: Secondary | ICD-10-CM | POA: Diagnosis not present

## 2019-12-20 DIAGNOSIS — I130 Hypertensive heart and chronic kidney disease with heart failure and stage 1 through stage 4 chronic kidney disease, or unspecified chronic kidney disease: Secondary | ICD-10-CM | POA: Diagnosis not present

## 2019-12-20 DIAGNOSIS — N183 Chronic kidney disease, stage 3 unspecified: Secondary | ICD-10-CM | POA: Diagnosis not present

## 2019-12-20 DIAGNOSIS — G9340 Encephalopathy, unspecified: Secondary | ICD-10-CM

## 2019-12-20 DIAGNOSIS — I6521 Occlusion and stenosis of right carotid artery: Secondary | ICD-10-CM

## 2019-12-20 MED ORDER — DONEPEZIL HCL 5 MG PO TABS
5.0000 mg | ORAL_TABLET | Freq: Every day | ORAL | 3 refills | Status: DC
Start: 2019-12-20 — End: 2019-12-24

## 2019-12-20 NOTE — Patient Instructions (Addendum)
Ok to try aricept 5mg  daily.  Call neurology to notify them about recent hospitalization (Dr Manuella Ghazi).  Good to se you today. Return as needed or in 6 wks for follow up visit.

## 2019-12-20 NOTE — Progress Notes (Signed)
This visit was conducted in person.  BP 122/62 (BP Location: Right Arm, Patient Position: Sitting, Cuff Size: Normal)   Pulse 79   Temp 97.9 F (36.6 C) (Temporal)   Wt 139 lb 8 oz (63.3 kg)   SpO2 98%   BMI 20.60 kg/m    CC: AMW converted to hosp fu visit  Subjective:    [name], male    DOB: [birth year], 86 y.o.   MRN: [id number]  HPI: Jimmy [name] is a 86 y.o. male presenting on 12/20/2019 for Medicare Wellness (Pt accompanied by wife, [name]- temp 97.9.)   Recent hospitalization for progressive weakness and acute AMS with increased confusion and decreased responsiveness without witnessed seizure. Records reviewed. CT and MRI negative for acute stroke, just chronic and remote lacunar infarcts involving bilateral basal ganglia, thalami, pons. Infectious workup returned normal. Previous neuro eval concerning for seizures, he and wife declined AED. EEG during hospitalization did not show seizure or epileptiform discharges - but was suggestive of nonspecific cortical dysfunction in L temporal region as well as mild diffuse encephalopathy. Elevated troponins during hospitalization - thought due to demand ischemia after cardiology evaluation.   Progressive decline noted at home over the past month. Wife struggling to care for him at home, she and patient desire he remain at home at all costs. He was unable to stand long enough for height at OV today.   Difficult situation in recurrent strokes, both hemorrhagic and ischemic - on aspirin 325mg  daily.   Likely mixed alz/vascular dementia - never started aricept.  Followed by neurology (Dr [facility]uella Ghazi at [facility]) - next appointment scheduled for next month.  Good appetite, ongoing weight loss. No blood in stool or urine. No pain anywhere. Loose bowels for the past 3 days.  They have personal aide Wilfred Curtis that comes out to their house 6d/wk.   Admit date: 12/09/2019 Discharge date: 12/14/2019 TCM hosp f/u phone  call not completed   Admitted From: home Disposition:  Home w/ hospice  Recommendations for Outpatient Follow-up:  1. Follow up w/ hospice provider as soon as possible  Home Health: home w/ hospice Equipment/Devices:  Discharge Condition: hospice CODE STATUS: DNR Diet recommendation: as tolerated   Discharge Diagnoses: Active Problems:   TIA (transient ischemic attack)   Encephalopathy      Relevant past medical, surgical, family and social history reviewed and updated as indicated. Interim medical history since our last visit reviewed. Allergies and medications reviewed and updated. Outpatient Medications Prior to Visit  Medication Sig Dispense Refill  . acetaminophen (TYLENOL) 325 MG tablet Take 2 tablets (650 mg total) by mouth every 4 (four) hours as needed for mild pain (or temp > 37.5 C (99.5 F)).    [name] allopurinol (ZYLOPRIM) 300 MG tablet TAKE 1 TABLET DAILY (Patient taking differently: Take 300 mg by mouth at bedtime. ) 90 tablet 3  . aspirin EC 325 MG EC tablet Take 1 tablet (325 mg total) by mouth daily. 180 tablet 0  . diphenhydrAMINE (BENADRYL ALLERGY) 25 MG tablet Take 1 tablet (25 mg total) by mouth at bedtime as needed for sleep. (Patient taking differently: Take 50 mg by mouth at bedtime. )    . DM-Phenylephrine-Acetaminophen (VICKS DAYQUIL COLD & FLU PO) Take 15 mLs by mouth daily as needed (cough).    [name] gentamicin cream (GARAMYCIN) 0.1 % Apply 1 application topically 2 (two) times daily. (Patient taking differently: Apply 1 application topically See admin instructions. Apply topically to left  big toe after showering until healed) 30 g 1  . lisinopril (ZESTRIL) 5 MG tablet TAKE 1 TABLET DAILY (NEW DOSE) 90 tablet 0  . nitroGLYCERIN (NITROSTAT) 0.4 MG SL tablet Place 1 tablet (0.4 mg total) under the tongue every 5 (five) minutes as needed for chest pain (max 3 doses in 15 minutes). 25 tablet 3  . simvastatin (ZOCOR) 20 MG tablet Take 1 tablet (20 mg total) by  mouth at bedtime. 90 tablet 0  . valACYclovir (VALTREX) 500 MG tablet TAKE 1 TABLET DAILY (Patient taking differently: Take 500 mg by mouth daily. ) 90 tablet 3  . vitamin B-12 (CYANOCOBALAMIN) 1000 MCG tablet Take 1 tablet (1,000 mcg total) by mouth daily.    [name] VITAMIN D PO Take 1 tablet by mouth daily.     [name] donepezil (ARICEPT) 5 MG tablet Take 5 mg by mouth daily. (Patient not taking: Reported on 12/09/2019)     No facility-administered medications prior to visit.     Per HPI unless specifically indicated in ROS section below Review of Systems Objective:  BP 122/62 (BP Location: Right Arm, Patient Position: Sitting, Cuff Size: Normal)   Pulse 79   Temp 97.9 F (36.6 C) (Temporal)   Wt 139 lb 8 oz (63.3 kg)   SpO2 98%   BMI 20.60 kg/m   Wt Readings from Last 3 Encounters:  12/20/19 139 lb 8 oz (63.3 kg)  12/09/19 150 lb (68 kg)  12/04/19 143 lb 15.4 oz (65.3 kg)      Physical Exam Vitals and nursing note reviewed.  Constitutional:      Appearance: Normal appearance. He is not ill-appearing.     Comments: Sitting in wheelchair  Cardiovascular:     Rate and Rhythm: Normal rate and regular rhythm.     Pulses: Normal pulses.     Heart sounds: Normal heart sounds. No murmur heard.   Pulmonary:     Effort: Pulmonary effort is normal. No respiratory distress.     Breath sounds: Normal breath sounds. No wheezing, rhonchi or rales.  Musculoskeletal:     Right lower leg: No edema.     Left lower leg: No edema.  Skin:    General: Skin is warm and dry.     Findings: No rash.  Neurological:     Mental Status: He is alert.     Comments: Slowed speech  Psychiatric:        Mood and Affect: Mood normal.        Behavior: Behavior normal.       Lab Results  Component Value Date   CREATININE 0.80 12/14/2019   BUN 24 (H) 12/14/2019   NA 138 12/14/2019   K 3.3 (L) 12/14/2019   CL 101 12/14/2019   CO2 29 12/14/2019    Lab Results  Component Value Date   WBC 5.9 12/14/2019    HGB 13.7 12/14/2019   HCT 39.8 12/14/2019   MCV 91.1 12/14/2019   PLT 147 (L) 12/14/2019    Lab Results  Component Value Date   HGBA1C 5.4 12/10/2019    Lab Results  Component Value Date   CHOL 106 12/10/2019   HDL 35 (L) 12/10/2019   LDLCALC 48 12/10/2019   TRIG 114 12/10/2019   CHOLHDL 3.0 12/10/2019    Assessment & Plan:  This visit occurred during the SARS-CoV-2 public health emergency.  Safety protocols were in place, including screening questions prior to the visit, additional usage of staff PPE, and extensive cleaning of  exam room while observing appropriate contact time as indicated for disinfecting solutions.   Will return for AMW.  Problem List Items Addressed This Visit      Chronic   Chronic lymphocytic leukemia, Rai stage 0 ([place])    Stable period, seeing [facility] now yearly (virtual visits)        Other   Mixed hyperlipidemia    Continues simvastatin with benefit, latest FLP WNL during hospitalization.       Mixed Alzheimer's and vascular dementia ([place])    Presumed mixed dementia picture - sees neurology Manuella Ghazi). It seems they never started aricept. No significant h/o GERD or arrhythmia - reviewed monitoring for worsening GI upset or arrhythmia with commencement of aricept - sent in 5mg  dose.       Relevant Medications   donepezil (ARICEPT) 5 MG tablet   Hospice care patient - Primary    Has transitioned to hospice care as of latest hospitalization due to FTT at home. Plan to be under hospice care for the next 3 months then will reassess.  They currently have assistance through personal care aide 6 days/wk. Will continue this.       History of stroke with residual deficit    H/o both hemorrhagic and ischemic strokes.  Continues aspirin 325mg  daily, goal BP <120/80.       History of hemorrhagic stroke with residual hemiparesis (HCC)    Avoiding stronger anticoagulation.       General weakness    Progressive generalized weakness with FTT at home,  wife struggling with caring for patient at home. Hospice eval is appropriate. Appreciate their care.       Essential hypertension    Chronic, stable. Continue current regimen.       Encephalopathy    This has resolved.       DNR (do not resuscitate)       Meds ordered this encounter  Medications  . donepezil (ARICEPT) 5 MG tablet    Sig: Take 1 tablet (5 mg total) by mouth daily.    Dispense:  30 tablet    Refill:  3   No orders of the defined types were placed in this encounter.   Patient Instructions  Ok to try aricept 5mg  daily.  Call neurology to notify them about recent hospitalization (Dr Manuella Ghazi).  Good to se you today. Return as needed or in 6 wks for follow up visit.    Follow up plan: Return in about 6 weeks (around 01/31/2020) for follow up visit.  Ria Bush, MD

## 2019-12-21 DIAGNOSIS — I25708 Atherosclerosis of coronary artery bypass graft(s), unspecified, with other forms of angina pectoris: Secondary | ICD-10-CM | POA: Diagnosis not present

## 2019-12-21 DIAGNOSIS — I6529 Occlusion and stenosis of unspecified carotid artery: Secondary | ICD-10-CM | POA: Diagnosis not present

## 2019-12-21 DIAGNOSIS — I130 Hypertensive heart and chronic kidney disease with heart failure and stage 1 through stage 4 chronic kidney disease, or unspecified chronic kidney disease: Secondary | ICD-10-CM | POA: Diagnosis not present

## 2019-12-21 DIAGNOSIS — N183 Chronic kidney disease, stage 3 unspecified: Secondary | ICD-10-CM | POA: Diagnosis not present

## 2019-12-21 DIAGNOSIS — F0150 Vascular dementia without behavioral disturbance: Secondary | ICD-10-CM | POA: Diagnosis not present

## 2019-12-21 DIAGNOSIS — I5032 Chronic diastolic (congestive) heart failure: Secondary | ICD-10-CM | POA: Diagnosis not present

## 2019-12-21 NOTE — Assessment & Plan Note (Signed)
This has resolved.

## 2019-12-21 NOTE — Assessment & Plan Note (Signed)
Avoiding stronger anticoagulation.

## 2019-12-21 NOTE — Assessment & Plan Note (Addendum)
Progressive generalized weakness with FTT at home, wife struggling with caring for patient at home. Hospice eval is appropriate. Appreciate their care.

## 2019-12-21 NOTE — Assessment & Plan Note (Signed)
H/o both hemorrhagic and ischemic strokes.  Continues aspirin 325mg  daily, goal BP <120/80.

## 2019-12-21 NOTE — Assessment & Plan Note (Signed)
Continues simvastatin with benefit, latest FLP WNL during hospitalization.

## 2019-12-21 NOTE — Assessment & Plan Note (Signed)
Presumed mixed dementia picture - sees neurology Manuella Ghazi). It seems they never started aricept. No significant h/o GERD or arrhythmia - reviewed monitoring for worsening GI upset or arrhythmia with commencement of aricept - sent in 5mg  dose.

## 2019-12-21 NOTE — Assessment & Plan Note (Addendum)
Has transitioned to hospice care as of latest hospitalization due to FTT at home. Plan to be under hospice care for the next 3 months then will reassess.  They currently have assistance through personal care aide 6 days/wk. Will continue this.

## 2019-12-21 NOTE — Assessment & Plan Note (Signed)
Chronic, stable. Continue current regimen. 

## 2019-12-21 NOTE — Assessment & Plan Note (Signed)
Stable period, seeing [facility] now yearly (virtual visits)

## 2019-12-23 ENCOUNTER — Ambulatory Visit (INDEPENDENT_AMBULATORY_CARE_PROVIDER_SITE_OTHER): Payer: Medicare Other | Admitting: Dermatology

## 2019-12-23 ENCOUNTER — Other Ambulatory Visit: Payer: Self-pay

## 2019-12-23 DIAGNOSIS — L820 Inflamed seborrheic keratosis: Secondary | ICD-10-CM | POA: Diagnosis not present

## 2019-12-23 DIAGNOSIS — L821 Other seborrheic keratosis: Secondary | ICD-10-CM | POA: Diagnosis not present

## 2019-12-23 DIAGNOSIS — L570 Actinic keratosis: Secondary | ICD-10-CM | POA: Diagnosis not present

## 2019-12-23 DIAGNOSIS — L578 Other skin changes due to chronic exposure to nonionizing radiation: Secondary | ICD-10-CM

## 2019-12-23 DIAGNOSIS — I6521 Occlusion and stenosis of right carotid artery: Secondary | ICD-10-CM

## 2019-12-23 NOTE — Patient Instructions (Signed)
Cryotherapy Aftercare  . Wash gently with soap and water everyday.   . Apply Vaseline and Band-Aid daily until healed.  

## 2019-12-23 NOTE — Progress Notes (Signed)
   Follow-Up Visit   Subjective  Jimmy [name] is a 86 y.o. male who presents for the following: Actinic Keratosis (Pt presents for 6 months f/u  hx of AKs on face and scalp ).  He complains of sores on the neck for several weeks.  He also has a spot on his thigh that is itching and he would like treated. Pt with CLL recent TIA, pt was recently hospitalized, pt now under Hospice care  Wife with pt who gives history for patient  The following portions of the chart were reviewed this encounter and updated as appropriate:  Tobacco  Allergies  Meds  Problems  Med Hx  Surg Hx  Fam Hx     Review of Systems:  No other skin or systemic complaints except as noted in HPI or Assessment and Plan.  Objective  Well appearing patient in no apparent distress; mood and affect are within normal limits.  A focused examination was performed including face, neck, R thigh . Relevant physical exam findings are noted in the Assessment and Plan.  Objective  R neck (3): Erythematous thin papules/macules with gritty scale.   Objective  Right Thigh - Anterior: Erythematous keratotic or waxy stuck-on papule or plaque.    Assessment & Plan  AK (actinic keratosis) (3) R neck  Destruction of lesion - R neck Complexity: simple   Destruction method: cryotherapy   Informed consent: discussed and consent obtained   Timeout:  patient name, date of birth, surgical site, and procedure verified Lesion destroyed using liquid nitrogen: Yes   Region frozen until ice ball extended beyond lesion: Yes   Outcome: patient tolerated procedure well with no complications   Post-procedure details: wound care instructions given    Inflamed seborrheic keratosis Right Thigh - Anterior  Destruction of lesion - Right Thigh - Anterior Complexity: simple   Destruction method: cryotherapy   Informed consent: discussed and consent obtained   Timeout:  patient name, date of birth, surgical site, and procedure  verified Lesion destroyed using liquid nitrogen: Yes   Region frozen until ice ball extended beyond lesion: Yes   Outcome: patient tolerated procedure well with no complications   Post-procedure details: wound care instructions given    Actinic Damage - diffuse scaly erythematous macules with underlying dyspigmentation - Recommend daily broad spectrum sunscreen SPF 30+ to sun-exposed areas, reapply every 2 hours as needed.  - Call for new or changing lesions.  Seborrheic Keratoses - Stuck-on, waxy, tan-brown papules and plaques  - Discussed benign etiology and prognosis. - Observe - Call for any changes   Return if symptoms worsen or fail to improve.  IMarye Round, CMA, am acting as scribe for Sarina Ser, MD .  Documentation: I have reviewed the above documentation for accuracy and completeness, and I agree with the above.  Sarina Ser, MD

## 2019-12-24 ENCOUNTER — Telehealth: Payer: Self-pay

## 2019-12-24 ENCOUNTER — Encounter: Payer: Self-pay | Admitting: Dermatology

## 2019-12-24 NOTE — Telephone Encounter (Signed)
Spoke with pt's wife, [name] (on dpr), relaying Dr. Synthia Innocent message.  Pt verbalizes understanding.

## 2019-12-24 NOTE — Telephone Encounter (Signed)
Pt's spouse [name] called to let you know that since pt started Donepezil 3 days ago, pt has been experiencing explosive diarrhea watery stools along with abdominal pain.... denies nausea/vomiting...[name] please advise

## 2019-12-24 NOTE — Telephone Encounter (Signed)
Recommend stop aricept and notify Dr Manuella Ghazi neurology that he is not tolerating medication to see if alternative is recommended.

## 2019-12-27 ENCOUNTER — Ambulatory Visit: Payer: Medicare Other | Admitting: Family Medicine

## 2019-12-29 ENCOUNTER — Telehealth: Payer: Self-pay | Admitting: *Deleted

## 2019-12-29 MED ORDER — TRAZODONE HCL 50 MG PO TABS: 25.0000 mg | ORAL_TABLET | Freq: Every evening | ORAL | 1 refills | Status: DC | PRN

## 2019-12-29 NOTE — Telephone Encounter (Signed)
Noted.  Recommend we try trazodone 50mg  1/2-1 tab at night to help sleep. Sent to pharmacy. Update with effect.

## 2019-12-29 NOTE — Telephone Encounter (Signed)
Spoke with Mitzie relaying Dr. Synthia Innocent message.  Verbalizes understanding.

## 2019-12-29 NOTE — Telephone Encounter (Signed)
Mitzie nurse with [facility] left a voicemail wanting to let Dr.Gutierrez know that his wife did stop the dementia medication because of loose stools. Mitzie stated that they are stopping his simvastatin. Mitzie stated that the patient is still having night terrors that has been going on for 42 years from him being in the [employer]. Mitzie stated that the benadryl is not helping him to sleep. Mitzie stated that they do not have any orders for comfort care. Mitzie stated that they need an order for something to help the patient sleep at night.

## 2019-12-31 DIAGNOSIS — Z6820 Body mass index (BMI) 20.0-20.9, adult: Secondary | ICD-10-CM | POA: Diagnosis not present

## 2019-12-31 DIAGNOSIS — Z8619 Personal history of other infectious and parasitic diseases: Secondary | ICD-10-CM | POA: Diagnosis not present

## 2019-12-31 DIAGNOSIS — E538 Deficiency of other specified B group vitamins: Secondary | ICD-10-CM | POA: Diagnosis not present

## 2019-12-31 DIAGNOSIS — I25708 Atherosclerosis of coronary artery bypass graft(s), unspecified, with other forms of angina pectoris: Secondary | ICD-10-CM | POA: Diagnosis not present

## 2019-12-31 DIAGNOSIS — D696 Thrombocytopenia, unspecified: Secondary | ICD-10-CM | POA: Diagnosis not present

## 2019-12-31 DIAGNOSIS — M109 Gout, unspecified: Secondary | ICD-10-CM | POA: Diagnosis not present

## 2019-12-31 DIAGNOSIS — R634 Abnormal weight loss: Secondary | ICD-10-CM | POA: Diagnosis not present

## 2019-12-31 DIAGNOSIS — E785 Hyperlipidemia, unspecified: Secondary | ICD-10-CM | POA: Diagnosis not present

## 2019-12-31 DIAGNOSIS — Z85828 Personal history of other malignant neoplasm of skin: Secondary | ICD-10-CM | POA: Diagnosis not present

## 2019-12-31 DIAGNOSIS — I6529 Occlusion and stenosis of unspecified carotid artery: Secondary | ICD-10-CM | POA: Diagnosis not present

## 2019-12-31 DIAGNOSIS — I130 Hypertensive heart and chronic kidney disease with heart failure and stage 1 through stage 4 chronic kidney disease, or unspecified chronic kidney disease: Secondary | ICD-10-CM | POA: Diagnosis not present

## 2019-12-31 DIAGNOSIS — R1310 Dysphagia, unspecified: Secondary | ICD-10-CM | POA: Diagnosis not present

## 2019-12-31 DIAGNOSIS — Z8673 Personal history of transient ischemic attack (TIA), and cerebral infarction without residual deficits: Secondary | ICD-10-CM | POA: Diagnosis not present

## 2019-12-31 DIAGNOSIS — R221 Localized swelling, mass and lump, neck: Secondary | ICD-10-CM | POA: Diagnosis not present

## 2019-12-31 DIAGNOSIS — I5032 Chronic diastolic (congestive) heart failure: Secondary | ICD-10-CM | POA: Diagnosis not present

## 2019-12-31 DIAGNOSIS — G4700 Insomnia, unspecified: Secondary | ICD-10-CM | POA: Diagnosis not present

## 2019-12-31 DIAGNOSIS — K219 Gastro-esophageal reflux disease without esophagitis: Secondary | ICD-10-CM | POA: Diagnosis not present

## 2019-12-31 DIAGNOSIS — F0150 Vascular dementia without behavioral disturbance: Secondary | ICD-10-CM | POA: Diagnosis not present

## 2019-12-31 DIAGNOSIS — Z856 Personal history of leukemia: Secondary | ICD-10-CM | POA: Diagnosis not present

## 2019-12-31 DIAGNOSIS — I739 Peripheral vascular disease, unspecified: Secondary | ICD-10-CM | POA: Diagnosis not present

## 2019-12-31 DIAGNOSIS — N183 Chronic kidney disease, stage 3 unspecified: Secondary | ICD-10-CM | POA: Diagnosis not present

## 2019-12-31 NOTE — Telephone Encounter (Signed)
Mitzi nurse with [facility] left /vm that pt has been taking trazodone 50 mg taking 1/2 tab at hs; pts wife told Mitzi for the past 2 nights taking trazodone 50 mg taking 1/2 tab; the pill has been working beautifully for the last 2 nights; both pt and his wife have gotten sleep and only woke up x 1 instead of several times during the night; no agitation, anxiety, lashing out or PTFD. Very good update. FYI to Dr Darnell Level.

## 2019-12-31 NOTE — Telephone Encounter (Signed)
Glad to hear.  Thank you 

## 2020-01-03 ENCOUNTER — Telehealth: Payer: Self-pay | Admitting: *Deleted

## 2020-01-03 DIAGNOSIS — I6529 Occlusion and stenosis of unspecified carotid artery: Secondary | ICD-10-CM | POA: Diagnosis not present

## 2020-01-03 DIAGNOSIS — I25708 Atherosclerosis of coronary artery bypass graft(s), unspecified, with other forms of angina pectoris: Secondary | ICD-10-CM | POA: Diagnosis not present

## 2020-01-03 DIAGNOSIS — I130 Hypertensive heart and chronic kidney disease with heart failure and stage 1 through stage 4 chronic kidney disease, or unspecified chronic kidney disease: Secondary | ICD-10-CM | POA: Diagnosis not present

## 2020-01-03 DIAGNOSIS — N183 Chronic kidney disease, stage 3 unspecified: Secondary | ICD-10-CM | POA: Diagnosis not present

## 2020-01-03 DIAGNOSIS — F0150 Vascular dementia without behavioral disturbance: Secondary | ICD-10-CM | POA: Diagnosis not present

## 2020-01-03 DIAGNOSIS — I5032 Chronic diastolic (congestive) heart failure: Secondary | ICD-10-CM | POA: Diagnosis not present

## 2020-01-03 NOTE — Telephone Encounter (Signed)
[employer] with the [facility] left a voicemail wanting to know if Dr. Danise Mina has any concerns with the care that the patient is receiving at home? [phone] or [phone]

## 2020-01-03 NOTE — Telephone Encounter (Signed)
I do not.

## 2020-01-04 ENCOUNTER — Ambulatory Visit: Payer: Medicare Other | Admitting: Podiatry

## 2020-01-04 DIAGNOSIS — I130 Hypertensive heart and chronic kidney disease with heart failure and stage 1 through stage 4 chronic kidney disease, or unspecified chronic kidney disease: Secondary | ICD-10-CM | POA: Diagnosis not present

## 2020-01-04 DIAGNOSIS — N183 Chronic kidney disease, stage 3 unspecified: Secondary | ICD-10-CM | POA: Diagnosis not present

## 2020-01-04 DIAGNOSIS — I5032 Chronic diastolic (congestive) heart failure: Secondary | ICD-10-CM | POA: Diagnosis not present

## 2020-01-04 DIAGNOSIS — F0150 Vascular dementia without behavioral disturbance: Secondary | ICD-10-CM | POA: Diagnosis not present

## 2020-01-04 DIAGNOSIS — I6529 Occlusion and stenosis of unspecified carotid artery: Secondary | ICD-10-CM | POA: Diagnosis not present

## 2020-01-04 DIAGNOSIS — I25708 Atherosclerosis of coronary artery bypass graft(s), unspecified, with other forms of angina pectoris: Secondary | ICD-10-CM | POA: Diagnosis not present

## 2020-01-04 NOTE — Telephone Encounter (Addendum)
Spoke with Jerene Pitch at [facility].

## 2020-01-04 NOTE — Telephone Encounter (Signed)
Returned Brook's call relaying Dr. Synthia Innocent message.  States she has a few more detailed questions for Dr. Darnell Level.  Fwd call to Dr. Darnell Level.

## 2020-01-07 ENCOUNTER — Other Ambulatory Visit: Payer: Self-pay

## 2020-01-07 ENCOUNTER — Ambulatory Visit (INDEPENDENT_AMBULATORY_CARE_PROVIDER_SITE_OTHER): Payer: Medicare Other | Admitting: Podiatry

## 2020-01-07 ENCOUNTER — Encounter: Payer: Self-pay | Admitting: Podiatry

## 2020-01-07 DIAGNOSIS — M79676 Pain in unspecified toe(s): Secondary | ICD-10-CM

## 2020-01-07 DIAGNOSIS — L603 Nail dystrophy: Secondary | ICD-10-CM | POA: Diagnosis not present

## 2020-01-07 DIAGNOSIS — N183 Chronic kidney disease, stage 3 unspecified: Secondary | ICD-10-CM | POA: Diagnosis not present

## 2020-01-07 DIAGNOSIS — I25708 Atherosclerosis of coronary artery bypass graft(s), unspecified, with other forms of angina pectoris: Secondary | ICD-10-CM | POA: Diagnosis not present

## 2020-01-07 DIAGNOSIS — B351 Tinea unguium: Secondary | ICD-10-CM | POA: Diagnosis not present

## 2020-01-07 DIAGNOSIS — L03031 Cellulitis of right toe: Secondary | ICD-10-CM

## 2020-01-07 DIAGNOSIS — I6529 Occlusion and stenosis of unspecified carotid artery: Secondary | ICD-10-CM | POA: Diagnosis not present

## 2020-01-07 DIAGNOSIS — I5032 Chronic diastolic (congestive) heart failure: Secondary | ICD-10-CM | POA: Diagnosis not present

## 2020-01-07 DIAGNOSIS — I130 Hypertensive heart and chronic kidney disease with heart failure and stage 1 through stage 4 chronic kidney disease, or unspecified chronic kidney disease: Secondary | ICD-10-CM | POA: Diagnosis not present

## 2020-01-07 DIAGNOSIS — F0150 Vascular dementia without behavioral disturbance: Secondary | ICD-10-CM | POA: Diagnosis not present

## 2020-01-07 NOTE — Progress Notes (Signed)
HPI: 86 y.o. male presenting today with his wife for evaluation of redness to the right great toe.  The patient does have some pain associated with the toenails.  The patient was last seen in the office June 2021 at which time total temporary nail avulsion was performed to the left hallux.  This healed very well and they are considering the possibility of having total temporary nail avulsion performed to the right great toe today.  They are also requesting a nail trim.  He is unable to trim his own nails and they are very sensitive and tender to palpation and with shoe gear.  Past Medical History:  Diagnosis Date  . Actinic keratosis   . Basal cell carcinoma 02/15/2016   Right lateral neck infraauricular of SCC scar  . Basal cell carcinoma 02/15/2016   Left chin  . Basal cell carcinoma 03/08/2014   Right of midline forehead  . CAD (coronary artery disease)    a. 04/2001 S/P CABGx 4;  b. 2008 MV:  EF 63% normal perfusion.  . Carotid stenosis    a. 11/2012 Carotid U/S:  RICA 97-67%, LICA 34-19%.  . Chronic kidney disease, stage 3 ([place])   . CLL (chronic lymphoblastic leukemia) 2009   Stage IV; Dr. Ivor Messier - referred to Dr. Lissa Merlin [facility] 07/2013 now on Rituxan (10/2013) --> stage 0 (09/2014)  . Diastolic CHF ([place])    a. 05/7900 Echo: EF 55-65%, mild conc LVH, Gr 1 DD, mild AS, triv AI, mildly dil Ao root (3.5 cm).  . GERD (gastroesophageal reflux disease) 1990s  . History of CVA (cerebrovascular accident) 2015   by MRI - remote L internal capsule  . History of herpes genitalis    valtrex daily  . Hyperlipemia 2002  . Hypertension 2002  . Mass of submandibular region 2015   referred to gen surg after chemo  . Mild aortic stenosis    a. 07/2011 Echo: Mild AS, triv AI.  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 Skin cancer   . Squamous cell carcinoma of skin 10/05/2014   Right mid posterior ear  . Stroke ([place])   . Thrombocytopenia ([place])    outpatient goals Hgb >9,  plt >20  . TIA (transient ischemic attack)    04/2016     Physical Exam: General: The patient is alert and oriented x3 in no acute distress.  Dermatology: Dystrophic nails noted to the right great toe with evidence of a new nail growth that is pushing out old nail growth.  The base of the nail plates bilateral do appear to be erythematous.  There is actually some drainage noted to the left hallux nail plate base.  Skin is warm, dry and supple bilateral lower extremities.  Hyperkeratotic dystrophic discolored nails also noted 2-5 bilateral  Vascular: Palpable pedal pulses bilaterally. Capillary refill within normal limits.  Neurological: Epicritic and protective threshold grossly intact bilaterally.   Musculoskeletal Exam: Range of motion within normal limits to all pedal and ankle joints bilateral. Muscle strength 5/5 in all groups bilateral.   Assessment: 1.  Dystrophic nail with associated tenderness to palpation and onycholysis right hallux nail plate  2.  Pain due to onychomycosis of toenail bilateral 3.  History of left hallux nail plate temporary total nail avulsion.  June 2021  Plan of Care:  1. Patient evaluated.  2.  Today explained to the patient and wife that it would be in his best interest to remove the nail  plate to the right hallux.  The patient and spouse agree.  The toe was prepped in aseptic manner and digital block was performed consisting of 3 mL of 2% lidocaine plain.  Right hallux nail plate was removed in toto and dry sterile dressing was applied.  Post care instructions were provided 3.  Resume gentamicin cream daily.  Patient has a prescription from previous procedure  4.  Mechanical debridement of nails 2-5 bilateral was performed using a nail nipper without incident or bleeding  5.  Return to clinic in 3 months for routine foot care     Edrick Kins, DPM [facility]  Dr. Edrick Kins, DPM    [address], [place] [address]                Office [phone]  Fax [phone]

## 2020-01-10 DIAGNOSIS — F0150 Vascular dementia without behavioral disturbance: Secondary | ICD-10-CM | POA: Diagnosis not present

## 2020-01-10 DIAGNOSIS — I25708 Atherosclerosis of coronary artery bypass graft(s), unspecified, with other forms of angina pectoris: Secondary | ICD-10-CM | POA: Diagnosis not present

## 2020-01-10 DIAGNOSIS — I5032 Chronic diastolic (congestive) heart failure: Secondary | ICD-10-CM | POA: Diagnosis not present

## 2020-01-10 DIAGNOSIS — N183 Chronic kidney disease, stage 3 unspecified: Secondary | ICD-10-CM | POA: Diagnosis not present

## 2020-01-10 DIAGNOSIS — I130 Hypertensive heart and chronic kidney disease with heart failure and stage 1 through stage 4 chronic kidney disease, or unspecified chronic kidney disease: Secondary | ICD-10-CM | POA: Diagnosis not present

## 2020-01-10 DIAGNOSIS — I6529 Occlusion and stenosis of unspecified carotid artery: Secondary | ICD-10-CM | POA: Diagnosis not present

## 2020-01-18 ENCOUNTER — Telehealth: Payer: Self-pay

## 2020-01-18 NOTE — Telephone Encounter (Signed)
[facility] - Client TELEPHONE ADVICE RECORD [facility] Patient Name: Jimmy [name] Gender: Male DOB: 1929/03/18 Age: 86 Y 33 M 12 D Return Phone Number: [phone] (Primary), [phone] (Secondary) [place] [address] Client [facility] - Client Client Site [place] - Day Physician Ria Bush - MD Contact Type Call Who Is Calling Patient / Member / Family / Caregiver Call Type Triage / Clinical Caller Name Graysyn Bache Relationship To Patient Spouse Return Phone Number [phone] (Primary) Chief Complaint Wound Infection Reason for Call Symptomatic / Request for Health Information Initial Comment Caller states Robin from Dr. Danise Mina office for triage . Wife [name] is on the phone stating pt sore left arm , when he was in hospital 2 weeks ago and drew blood from his left arm sore that been bleeding. and is coughing up yellow secretions. And wants to know when they can get Flu shots. Translation No No Triage Reason Other Nurse Assessment Nurse: Lucky Cowboy, RN, Levada Dy Date/Time (Eastern Time): 01/18/2020 2:14:19 PM Confirm and document reason for call. If symptomatic, describe symptoms. ---Caller is pt's wife. He gave permission to speak with her. She stated that her husband coughed up a yellow bit this morning and the "nurse" she spoke to told her that it came from the infection in his arm. Wife stated that his arm has been bleeding for 2 weeks. She then stated that he is on hospice. She doesn't want him to seek aggressive tx (including ER). Ins her to call hospice and ask for one of their nurses to come out and evaluate him. Does the patient have any new or worsening symptoms? ---Yes Will a triage be completed? ---No Select reason for no triage. ---Other Please document clinical information provided and list any resource used. ---Pt is on hospice and wife  doesn't want aggressive tx. Ins her to call hospice for an evaluation of his issues as they do not seem to be related to each other (coughing up yellow mucus and an arm wound). Disp. Time Eilene Ghazi Time) Disposition Final User 01/18/2020 2:19:53 PM Clinical Call Yes Lucky Cowboy, RN, Levada Dy PLEASE NOTE: All timestamps contained within this report are represented as [place] Standard Time. CONFIDENTIALTY NOTICE: This fax transmission is intended only for the addressee. It contains information that is legally privileged, confidential or otherwise protected from use or disclosure. If you are not the intended recipient, you are strictly prohibited from reviewing, disclosing, copying using or disseminating any of this information or taking any action in reliance on or regarding this information. If you have received this fax in error, please notify us immediately by telephone so that we can arrange for its return to [place]. Phone: [phone], Toll-Free: [phone], Fax: [phone] Page: 2 of 2 Call Id:

## 2020-01-19 DIAGNOSIS — I130 Hypertensive heart and chronic kidney disease with heart failure and stage 1 through stage 4 chronic kidney disease, or unspecified chronic kidney disease: Secondary | ICD-10-CM | POA: Diagnosis not present

## 2020-01-19 DIAGNOSIS — F0150 Vascular dementia without behavioral disturbance: Secondary | ICD-10-CM | POA: Diagnosis not present

## 2020-01-19 DIAGNOSIS — I25708 Atherosclerosis of coronary artery bypass graft(s), unspecified, with other forms of angina pectoris: Secondary | ICD-10-CM | POA: Diagnosis not present

## 2020-01-19 DIAGNOSIS — I6529 Occlusion and stenosis of unspecified carotid artery: Secondary | ICD-10-CM | POA: Diagnosis not present

## 2020-01-19 DIAGNOSIS — I5032 Chronic diastolic (congestive) heart failure: Secondary | ICD-10-CM | POA: Diagnosis not present

## 2020-01-19 DIAGNOSIS — N183 Chronic kidney disease, stage 3 unspecified: Secondary | ICD-10-CM | POA: Diagnosis not present

## 2020-01-21 ENCOUNTER — Other Ambulatory Visit: Payer: Self-pay | Admitting: Family Medicine

## 2020-01-21 DIAGNOSIS — F0150 Vascular dementia without behavioral disturbance: Secondary | ICD-10-CM | POA: Diagnosis not present

## 2020-01-21 DIAGNOSIS — I5032 Chronic diastolic (congestive) heart failure: Secondary | ICD-10-CM | POA: Diagnosis not present

## 2020-01-21 DIAGNOSIS — I25708 Atherosclerosis of coronary artery bypass graft(s), unspecified, with other forms of angina pectoris: Secondary | ICD-10-CM | POA: Diagnosis not present

## 2020-01-21 DIAGNOSIS — I6529 Occlusion and stenosis of unspecified carotid artery: Secondary | ICD-10-CM | POA: Diagnosis not present

## 2020-01-21 DIAGNOSIS — N183 Chronic kidney disease, stage 3 unspecified: Secondary | ICD-10-CM | POA: Diagnosis not present

## 2020-01-21 DIAGNOSIS — I130 Hypertensive heart and chronic kidney disease with heart failure and stage 1 through stage 4 chronic kidney disease, or unspecified chronic kidney disease: Secondary | ICD-10-CM | POA: Diagnosis not present

## 2020-01-26 DIAGNOSIS — N183 Chronic kidney disease, stage 3 unspecified: Secondary | ICD-10-CM | POA: Diagnosis not present

## 2020-01-26 DIAGNOSIS — I25708 Atherosclerosis of coronary artery bypass graft(s), unspecified, with other forms of angina pectoris: Secondary | ICD-10-CM | POA: Diagnosis not present

## 2020-01-26 DIAGNOSIS — I6529 Occlusion and stenosis of unspecified carotid artery: Secondary | ICD-10-CM | POA: Diagnosis not present

## 2020-01-26 DIAGNOSIS — F0150 Vascular dementia without behavioral disturbance: Secondary | ICD-10-CM | POA: Diagnosis not present

## 2020-01-26 DIAGNOSIS — I130 Hypertensive heart and chronic kidney disease with heart failure and stage 1 through stage 4 chronic kidney disease, or unspecified chronic kidney disease: Secondary | ICD-10-CM | POA: Diagnosis not present

## 2020-01-26 DIAGNOSIS — I5032 Chronic diastolic (congestive) heart failure: Secondary | ICD-10-CM | POA: Diagnosis not present

## 2020-01-27 DIAGNOSIS — N183 Chronic kidney disease, stage 3 unspecified: Secondary | ICD-10-CM | POA: Diagnosis not present

## 2020-01-27 DIAGNOSIS — I130 Hypertensive heart and chronic kidney disease with heart failure and stage 1 through stage 4 chronic kidney disease, or unspecified chronic kidney disease: Secondary | ICD-10-CM | POA: Diagnosis not present

## 2020-01-27 DIAGNOSIS — I6529 Occlusion and stenosis of unspecified carotid artery: Secondary | ICD-10-CM | POA: Diagnosis not present

## 2020-01-27 DIAGNOSIS — I25708 Atherosclerosis of coronary artery bypass graft(s), unspecified, with other forms of angina pectoris: Secondary | ICD-10-CM | POA: Diagnosis not present

## 2020-01-27 DIAGNOSIS — F0150 Vascular dementia without behavioral disturbance: Secondary | ICD-10-CM | POA: Diagnosis not present

## 2020-01-27 DIAGNOSIS — I5032 Chronic diastolic (congestive) heart failure: Secondary | ICD-10-CM | POA: Diagnosis not present

## 2020-01-27 NOTE — Telephone Encounter (Signed)
Lvm asking pt/pt's wife, [name] (on dpr), to call back.  Need update and answers to Dr. Synthia Innocent questions.

## 2020-01-27 NOTE — Telephone Encounter (Signed)
plz call for update - how is cough and arm sore? Did hospice come out and evaluate patient last week?

## 2020-01-27 NOTE — Telephone Encounter (Signed)
Pt's wife, Jimmy [name], returning call.  She states pt still has cough, no better, no worse.  But pt has trouble swallowing so she hast to crush all his meds and put in applesauce.  Pt c/o of feeling sleepy.  He has slept pretty much all day and has not eaten.  Also, pt was given Imodium and had 2 BMs last night.  [name] reports pt fell yesterday and was examined by nurse.  Pt denies any injury.  Arm sore seems to be healing well.  Applying Neosporin to area and looks good.  Per [name], 2 hospice nurses came to evaluate pt.  Says O2 and BP was good.

## 2020-01-28 DIAGNOSIS — I25708 Atherosclerosis of coronary artery bypass graft(s), unspecified, with other forms of angina pectoris: Secondary | ICD-10-CM | POA: Diagnosis not present

## 2020-01-28 DIAGNOSIS — I6529 Occlusion and stenosis of unspecified carotid artery: Secondary | ICD-10-CM | POA: Diagnosis not present

## 2020-01-28 DIAGNOSIS — I5032 Chronic diastolic (congestive) heart failure: Secondary | ICD-10-CM | POA: Diagnosis not present

## 2020-01-28 DIAGNOSIS — N183 Chronic kidney disease, stage 3 unspecified: Secondary | ICD-10-CM | POA: Diagnosis not present

## 2020-01-28 DIAGNOSIS — I130 Hypertensive heart and chronic kidney disease with heart failure and stage 1 through stage 4 chronic kidney disease, or unspecified chronic kidney disease: Secondary | ICD-10-CM | POA: Diagnosis not present

## 2020-01-28 DIAGNOSIS — F0150 Vascular dementia without behavioral disturbance: Secondary | ICD-10-CM | POA: Diagnosis not present

## 2020-01-31 ENCOUNTER — Other Ambulatory Visit: Payer: Self-pay

## 2020-01-31 ENCOUNTER — Encounter: Payer: Self-pay | Admitting: Family Medicine

## 2020-01-31 ENCOUNTER — Ambulatory Visit (INDEPENDENT_AMBULATORY_CARE_PROVIDER_SITE_OTHER): Payer: Medicare Other | Admitting: Family Medicine

## 2020-01-31 VITALS — BP 114/50 | HR 51 | Temp 98.6°F | Ht 69.0 in

## 2020-01-31 DIAGNOSIS — I69359 Hemiplegia and hemiparesis following cerebral infarction affecting unspecified side: Secondary | ICD-10-CM

## 2020-01-31 DIAGNOSIS — M542 Cervicalgia: Secondary | ICD-10-CM | POA: Diagnosis not present

## 2020-01-31 DIAGNOSIS — I69391 Dysphagia following cerebral infarction: Secondary | ICD-10-CM | POA: Diagnosis not present

## 2020-01-31 DIAGNOSIS — R531 Weakness: Secondary | ICD-10-CM

## 2020-01-31 DIAGNOSIS — F0280 Dementia in other diseases classified elsewhere without behavioral disturbance: Secondary | ICD-10-CM

## 2020-01-31 DIAGNOSIS — I130 Hypertensive heart and chronic kidney disease with heart failure and stage 1 through stage 4 chronic kidney disease, or unspecified chronic kidney disease: Secondary | ICD-10-CM | POA: Diagnosis not present

## 2020-01-31 DIAGNOSIS — Z8619 Personal history of other infectious and parasitic diseases: Secondary | ICD-10-CM | POA: Diagnosis not present

## 2020-01-31 DIAGNOSIS — Z8673 Personal history of transient ischemic attack (TIA), and cerebral infarction without residual deficits: Secondary | ICD-10-CM | POA: Diagnosis not present

## 2020-01-31 DIAGNOSIS — Z515 Encounter for palliative care: Secondary | ICD-10-CM

## 2020-01-31 DIAGNOSIS — I6930 Unspecified sequelae of cerebral infarction: Secondary | ICD-10-CM | POA: Diagnosis not present

## 2020-01-31 DIAGNOSIS — R1310 Dysphagia, unspecified: Secondary | ICD-10-CM | POA: Diagnosis not present

## 2020-01-31 DIAGNOSIS — G4701 Insomnia due to medical condition: Secondary | ICD-10-CM

## 2020-01-31 DIAGNOSIS — K219 Gastro-esophageal reflux disease without esophagitis: Secondary | ICD-10-CM | POA: Diagnosis not present

## 2020-01-31 DIAGNOSIS — Z66 Do not resuscitate: Secondary | ICD-10-CM

## 2020-01-31 DIAGNOSIS — E538 Deficiency of other specified B group vitamins: Secondary | ICD-10-CM | POA: Diagnosis not present

## 2020-01-31 DIAGNOSIS — W19XXXA Unspecified fall, initial encounter: Secondary | ICD-10-CM

## 2020-01-31 DIAGNOSIS — G4700 Insomnia, unspecified: Secondary | ICD-10-CM | POA: Diagnosis not present

## 2020-01-31 DIAGNOSIS — E785 Hyperlipidemia, unspecified: Secondary | ICD-10-CM | POA: Diagnosis not present

## 2020-01-31 DIAGNOSIS — R059 Cough, unspecified: Secondary | ICD-10-CM | POA: Diagnosis not present

## 2020-01-31 DIAGNOSIS — I5032 Chronic diastolic (congestive) heart failure: Secondary | ICD-10-CM | POA: Diagnosis not present

## 2020-01-31 DIAGNOSIS — C9110 Chronic lymphocytic leukemia of B-cell type not having achieved remission: Secondary | ICD-10-CM

## 2020-01-31 DIAGNOSIS — N183 Chronic kidney disease, stage 3 unspecified: Secondary | ICD-10-CM | POA: Diagnosis not present

## 2020-01-31 DIAGNOSIS — I10 Essential (primary) hypertension: Secondary | ICD-10-CM

## 2020-01-31 DIAGNOSIS — Z85828 Personal history of other malignant neoplasm of skin: Secondary | ICD-10-CM | POA: Diagnosis not present

## 2020-01-31 DIAGNOSIS — Z23 Encounter for immunization: Secondary | ICD-10-CM

## 2020-01-31 DIAGNOSIS — F0150 Vascular dementia without behavioral disturbance: Secondary | ICD-10-CM

## 2020-01-31 DIAGNOSIS — Z856 Personal history of leukemia: Secondary | ICD-10-CM | POA: Diagnosis not present

## 2020-01-31 DIAGNOSIS — I25708 Atherosclerosis of coronary artery bypass graft(s), unspecified, with other forms of angina pectoris: Secondary | ICD-10-CM | POA: Diagnosis not present

## 2020-01-31 DIAGNOSIS — Z6820 Body mass index (BMI) 20.0-20.9, adult: Secondary | ICD-10-CM | POA: Diagnosis not present

## 2020-01-31 DIAGNOSIS — I6529 Occlusion and stenosis of unspecified carotid artery: Secondary | ICD-10-CM | POA: Diagnosis not present

## 2020-01-31 DIAGNOSIS — R221 Localized swelling, mass and lump, neck: Secondary | ICD-10-CM | POA: Diagnosis not present

## 2020-01-31 DIAGNOSIS — G309 Alzheimer's disease, unspecified: Secondary | ICD-10-CM | POA: Diagnosis not present

## 2020-01-31 DIAGNOSIS — D696 Thrombocytopenia, unspecified: Secondary | ICD-10-CM | POA: Diagnosis not present

## 2020-01-31 DIAGNOSIS — M109 Gout, unspecified: Secondary | ICD-10-CM | POA: Diagnosis not present

## 2020-01-31 DIAGNOSIS — I739 Peripheral vascular disease, unspecified: Secondary | ICD-10-CM | POA: Diagnosis not present

## 2020-01-31 DIAGNOSIS — R634 Abnormal weight loss: Secondary | ICD-10-CM | POA: Diagnosis not present

## 2020-01-31 MED ORDER — AZITHROMYCIN 200 MG/5ML PO SUSR: ORAL | 0 refills | Status: AC

## 2020-01-31 NOTE — Progress Notes (Signed)
This visit was conducted in person.  BP (!) 114/50 (BP Location: Right Arm, Patient Position: Sitting, Cuff Size: Normal)   Pulse (!) 51   Temp 98.6 F (37 C) (Temporal)   Ht 5\' 9"  (1.753 m)   SpO2 98%   BMI 20.60 kg/m   BP Readings from Last 3 Encounters:  01/31/20 (!) 114/50  12/20/19 122/62  12/14/19 128/67  pulse rechecked - 51.  CC: 6 wk f/u visit  Subjective:    [name], male    DOB: [birth year], 86 y.o.   MRN: [id number]  HPI: Jimmy [name] is a 86 y.o. male presenting on 01/31/2020 for Follow-up (Here for 6 wk f/u.  Has been bed ridden since 01/26/20.  Sleeps all day. Having to crush meds to be able to swallow.  Not as responsive when asked questions.  Pt accompanined by wife, [name]- temp 97.5.)   Hospice at home involved - RN and SW coming out this afternoon.  Neck pain. Managing with liquid tylenol.  Increased sleeping, not eating well. Less responsive.  Staying in bed since Wed. Difficulty getting to office today - neighbor had to help wife get him into car.  More trouble swallowing, holds foods in his mouth before swallowing. Crushing meds - sometimes doesn't take them.  2 falls this week - when he gets up to go to the bathroom. Doesn't like using bedside commode.   They continue having personal care aide Wilfred Curtis 6d/wk. He receives a bath in bed daily.   Saw podiatry 12/2019 - dystrophic nail plate removed to R hallux.  Trazodone 50mg  1/2 tab QHS started 12/2019, with improvement in sleep.  Aricept not tolerated.      Relevant past medical, surgical, family and social history reviewed and updated as indicated. Interim medical history since our last visit reviewed. Allergies and medications reviewed and updated. Outpatient Medications Prior to Visit  Medication Sig Dispense Refill  . acetaminophen (TYLENOL) 325 MG tablet Take 2 tablets (650 mg total) by mouth every 4 (four) hours as needed for mild pain (or temp > 37.5 C (99.5 F)).      [name] allopurinol (ZYLOPRIM) 300 MG tablet TAKE 1 TABLET DAILY (Patient taking differently: Take 300 mg by mouth at bedtime. ) 90 tablet 3  . aspirin EC 325 MG EC tablet Take 1 tablet (325 mg total) by mouth daily. 180 tablet 0  . DM-Phenylephrine-Acetaminophen (VICKS DAYQUIL COLD & FLU PO) Take 15 mLs by mouth daily as needed (cough).    [name] gentamicin cream (GARAMYCIN) 0.1 % Apply 1 application topically 2 (two) times daily. (Patient taking differently: Apply 1 application topically See admin instructions. Apply topically to left big toe after showering until healed) 30 g 1  . nitroGLYCERIN (NITROSTAT) 0.4 MG SL tablet Place 1 tablet (0.4 mg total) under the tongue every 5 (five) minutes as needed for chest pain (max 3 doses in 15 minutes). 25 tablet 3  . simvastatin (ZOCOR) 20 MG tablet Take 1 tablet (20 mg total) by mouth at bedtime. 90 tablet 0  . traZODone (DESYREL) 50 MG tablet TAKE 0.5-1 TABLETS (25-50 MG TOTAL) BY MOUTH AT BEDTIME AS NEEDED FOR SLEEP. 90 tablet 0  . valACYclovir (VALTREX) 500 MG tablet TAKE 1 TABLET DAILY (Patient taking differently: Take 500 mg by mouth daily. ) 90 tablet 3  . diphenhydrAMINE (BENADRYL ALLERGY) 25 MG tablet Take 1 tablet (25 mg total) by mouth at bedtime as needed for sleep. (Patient taking differently: Take  50 mg by mouth at bedtime. )    . lisinopril (ZESTRIL) 5 MG tablet TAKE 1 TABLET DAILY (NEW DOSE) 90 tablet 0  . vitamin B-12 (CYANOCOBALAMIN) 1000 MCG tablet Take 1 tablet (1,000 mcg total) by mouth daily.    [name] VITAMIN D PO Take 1 tablet by mouth daily.      No facility-administered medications prior to visit.     Per HPI unless specifically indicated in ROS section below Review of Systems Objective:  BP (!) 114/50 (BP Location: Right Arm, Patient Position: Sitting, Cuff Size: Normal)   Pulse (!) 51   Temp 98.6 F (37 C) (Temporal)   Ht 5\' 9"  (1.753 m)   SpO2 98%   BMI 20.60 kg/m   Wt Readings from Last 3 Encounters:  12/20/19 139 lb 8 oz (63.3  kg)  12/09/19 150 lb (68 kg)  12/04/19 143 lb 15.4 oz (65.3 kg)      Physical Exam Vitals and nursing note reviewed.  Constitutional:      Appearance: He is ill-appearing.     Comments:  Thin, in wheelchair  Soft voice, difficult to understand   HENT:     Left Ear: Tympanic membrane, ear canal and external ear normal.     Mouth/Throat:     Comments: Colored sputum around lips, on mask Neck:     Comments:  Neck laterally flexed to right due to discomfort R lateral neck tenderness without mass or erythema No midline cervical spine or paracervical mm tenderness Cardiovascular:     Rate and Rhythm: Normal rate and regular rhythm.     Pulses: Normal pulses.     Heart sounds: Normal heart sounds.  Pulmonary:     Effort: Pulmonary effort is normal. No respiratory distress.     Breath sounds: No wheezing, rhonchi or rales.     Comments:  Intermittent cough present Lungs overall clear, mild crackles basilarly Musculoskeletal:     Cervical back: Tenderness present. No rigidity.     Right lower leg: No edema.     Left lower leg: No edema.  Lymphadenopathy:     Cervical: No cervical adenopathy.  Skin:    General: Skin is warm and dry.     Findings: No rash.  Neurological:     Mental Status: He is alert.  Psychiatric:        Mood and Affect: Mood normal.        Behavior: Behavior normal.      Lab Results  Component Value Date   CREATININE 0.80 12/14/2019   BUN 24 (H) 12/14/2019   NA 138 12/14/2019   K 3.3 (L) 12/14/2019   CL 101 12/14/2019   CO2 29 12/14/2019    Lab Results  Component Value Date   WBC 5.9 12/14/2019   HGB 13.7 12/14/2019   HCT 39.8 12/14/2019   MCV 91.1 12/14/2019   PLT 147 (L) 12/14/2019   Lab Results  Component Value Date   VITAMINB12 639 03/07/2011   Assessment & Plan:  This visit occurred during the SARS-CoV-2 public health emergency.  Safety protocols were in place, including screening questions prior to the visit, additional usage of  staff PPE, and extensive cleaning of exam room while observing appropriate contact time as indicated for disinfecting solutions.   Problem List Items Addressed This Visit      Chronic   Chronic lymphocytic leukemia, Rai stage 0 ([place])    Latest CBC stable 12/2019.       Relevant Medications  azithromycin (ZITHROMAX) 200 MG/5ML suspension     Other   Neck pain on right side    Anticipate cervical strain. Discussed tylenol use, voltaren gel, gentle stretching and massage.       Mixed Alzheimer's and vascular dementia ([place])    Did not tolerate aricept.        Insomnia    Agitation at night with trouble falling asleep - wife endorses has has chronically been on benadryl 50mg  at night. Last month we started trazodone 25-50mg  nightly which has significantly helped. Advised start taper of benadryl given anticholinergic side effects - with option to increase trazodone to 50mg  nightly.       Hospice care patient - Primary    Hospice involved, they may stop coming in for office visits as I stay in touch with home hospice team. Appreciate hospice care.       History of stroke with residual deficit    Continue full dose aspirin, statin for now with consideration to discontinue in future.       History of hemorrhagic stroke with residual hemiparesis (HCC)   General weakness    Progressive deterioration discussed with patient and wife.       Falls    Multiple falls at home recently - advised to start using bedside commode to minimize fall risk.       Essential hypertension    Goal blood pressure ideally <120/80 given extensive stroke risk. However DBP low today. Will stop lisinopril and monitor blood pressures.       Dysphagia, post-stroke    Progressive. Wife is now crushing meds. She is also using thicken-it solution.  Will try to minimize - stop lisinopril, vit B12 and D.       DNR (do not resuscitate)   Cough    Cough productive of colored sputum, ongoing for months. Will  cover for bacterial bronchitis with azithromycin suspension given difficulty swallowing pills. Continue cough syrup PRN.        Other Visit Diagnoses    Need for influenza vaccination       Relevant Orders   Flu Vaccine QUAD High Dose(Fluad) (Completed)       Meds ordered this encounter  Medications  . azithromycin (ZITHROMAX) 200 MG/5ML suspension    Sig: Take 12.5 mLs (500 mg total) by mouth daily for 1 day, THEN 6.3 mLs (250 mg total) daily for 4 days.    Dispense:  37.7 mL    Refill:  0   Orders Placed This Encounter  Procedures  . Flu Vaccine QUAD High Dose(Fluad)    Patient Instructions  I want you to use bedside commode, not walk to the bathroom due to risk of falls.  Stop lisinopril.  Try to drop benadryl to 1 tonight, if sleeping well, may stop benadryl, may increase trazodone to 50mg  at night time.  Ok to try off vitamin D and B12.  Check dosing of tylenol - may use 500-1000mg  up to three times a day for pain relief (neck). May use topical voltaren (diclofenac) over the counter anti inflammatory gel to neck.  For cough, take azithromycin suspension sent to pharmacy. May continue delsym, let us know if not improving with this.    Follow up plan: No follow-ups on file.  Ria Bush, MD

## 2020-01-31 NOTE — Patient Instructions (Addendum)
I want you to use bedside commode, not walk to the bathroom due to risk of falls.  Stop lisinopril.  Try to drop benadryl to 1 tonight, if sleeping well, may stop benadryl, may increase trazodone to 50mg  at night time.  Ok to try off vitamin D and B12.  Check dosing of tylenol - may use 500-1000mg  up to three times a day for pain relief (neck). May use topical voltaren (diclofenac) over the counter anti inflammatory gel to neck.  For cough, take azithromycin suspension sent to pharmacy. May continue delsym, let us know if not improving with this.

## 2020-02-01 ENCOUNTER — Telehealth: Payer: Self-pay | Admitting: Family Medicine

## 2020-02-01 DIAGNOSIS — I25708 Atherosclerosis of coronary artery bypass graft(s), unspecified, with other forms of angina pectoris: Secondary | ICD-10-CM | POA: Diagnosis not present

## 2020-02-01 DIAGNOSIS — I5032 Chronic diastolic (congestive) heart failure: Secondary | ICD-10-CM | POA: Diagnosis not present

## 2020-02-01 DIAGNOSIS — I130 Hypertensive heart and chronic kidney disease with heart failure and stage 1 through stage 4 chronic kidney disease, or unspecified chronic kidney disease: Secondary | ICD-10-CM | POA: Diagnosis not present

## 2020-02-01 DIAGNOSIS — I6529 Occlusion and stenosis of unspecified carotid artery: Secondary | ICD-10-CM | POA: Diagnosis not present

## 2020-02-01 DIAGNOSIS — F0150 Vascular dementia without behavioral disturbance: Secondary | ICD-10-CM | POA: Diagnosis not present

## 2020-02-01 DIAGNOSIS — N183 Chronic kidney disease, stage 3 unspecified: Secondary | ICD-10-CM | POA: Diagnosis not present

## 2020-02-01 NOTE — Telephone Encounter (Signed)
Jimmy [name] called in wanted to let Dr. Darnell Level know that she is giving him 8 fl. Oz 240 mg and its the cold and flu serve honey coated but the nurse aid advise to switch to the green colored Tylenol on and he has not ate today. And with the z-pack she can not leave him so he will start taking on Wednesday.

## 2020-02-03 DIAGNOSIS — G4700 Insomnia, unspecified: Secondary | ICD-10-CM | POA: Insufficient documentation

## 2020-02-03 DIAGNOSIS — W19XXXA Unspecified fall, initial encounter: Secondary | ICD-10-CM | POA: Insufficient documentation

## 2020-02-03 NOTE — Telephone Encounter (Signed)
Noted. Thanks.  He can use extra strength liquid tylenol (mint flavored) which should be 500mg /76mL and may take 59mL at a time for neck pain.  Keep [place] updated with how he does with azithromycin and cough.

## 2020-02-03 NOTE — Assessment & Plan Note (Signed)
Multiple falls at home recently - advised to start using bedside commode to minimize fall risk.

## 2020-02-03 NOTE — Assessment & Plan Note (Signed)
Latest CBC stable 12/2019.

## 2020-02-03 NOTE — Assessment & Plan Note (Signed)
Did not tolerate aricept.

## 2020-02-03 NOTE — Assessment & Plan Note (Signed)
Goal blood pressure ideally <120/80 given extensive stroke risk. However DBP low today. Will stop lisinopril and monitor blood pressures.

## 2020-02-03 NOTE — Assessment & Plan Note (Signed)
Agitation at night with trouble falling asleep - wife endorses has has chronically been on benadryl 50mg  at night. Last month we started trazodone 25-50mg  nightly which has significantly helped. Advised start taper of benadryl given anticholinergic side effects - with option to increase trazodone to 50mg  nightly.

## 2020-02-03 NOTE — Assessment & Plan Note (Addendum)
Continue full dose aspirin, statin for now with consideration to discontinue in future.

## 2020-02-03 NOTE — Assessment & Plan Note (Addendum)
Progressive. Wife is now crushing meds. She is also using thicken-it solution.  Will try to minimize - stop lisinopril, vit B12 and D.

## 2020-02-03 NOTE — Assessment & Plan Note (Signed)
Hospice involved, they may stop coming in for office visits as I stay in touch with home hospice team. Appreciate hospice care.

## 2020-02-03 NOTE — Assessment & Plan Note (Signed)
Cough productive of colored sputum, ongoing for months. Will cover for bacterial bronchitis with azithromycin suspension given difficulty swallowing pills. Continue cough syrup PRN.

## 2020-02-03 NOTE — Telephone Encounter (Signed)
Left message on vm per dpr relaying Dr. G's message.  

## 2020-02-03 NOTE — Assessment & Plan Note (Signed)
Anticipate cervical strain. Discussed tylenol use, voltaren gel, gentle stretching and massage.

## 2020-02-03 NOTE — Assessment & Plan Note (Addendum)
Progressive deterioration discussed with patient and wife.

## 2020-02-09 DIAGNOSIS — I25708 Atherosclerosis of coronary artery bypass graft(s), unspecified, with other forms of angina pectoris: Secondary | ICD-10-CM | POA: Diagnosis not present

## 2020-02-09 DIAGNOSIS — N183 Chronic kidney disease, stage 3 unspecified: Secondary | ICD-10-CM | POA: Diagnosis not present

## 2020-02-09 DIAGNOSIS — I5032 Chronic diastolic (congestive) heart failure: Secondary | ICD-10-CM | POA: Diagnosis not present

## 2020-02-09 DIAGNOSIS — I130 Hypertensive heart and chronic kidney disease with heart failure and stage 1 through stage 4 chronic kidney disease, or unspecified chronic kidney disease: Secondary | ICD-10-CM | POA: Diagnosis not present

## 2020-02-09 DIAGNOSIS — F0150 Vascular dementia without behavioral disturbance: Secondary | ICD-10-CM | POA: Diagnosis not present

## 2020-02-09 DIAGNOSIS — I6529 Occlusion and stenosis of unspecified carotid artery: Secondary | ICD-10-CM | POA: Diagnosis not present

## 2020-02-17 ENCOUNTER — Telehealth: Payer: Self-pay

## 2020-02-17 DIAGNOSIS — N183 Chronic kidney disease, stage 3 unspecified: Secondary | ICD-10-CM | POA: Diagnosis not present

## 2020-02-17 DIAGNOSIS — F0150 Vascular dementia without behavioral disturbance: Secondary | ICD-10-CM | POA: Diagnosis not present

## 2020-02-17 DIAGNOSIS — I5032 Chronic diastolic (congestive) heart failure: Secondary | ICD-10-CM | POA: Diagnosis not present

## 2020-02-17 DIAGNOSIS — I130 Hypertensive heart and chronic kidney disease with heart failure and stage 1 through stage 4 chronic kidney disease, or unspecified chronic kidney disease: Secondary | ICD-10-CM | POA: Diagnosis not present

## 2020-02-17 DIAGNOSIS — I25708 Atherosclerosis of coronary artery bypass graft(s), unspecified, with other forms of angina pectoris: Secondary | ICD-10-CM | POA: Diagnosis not present

## 2020-02-17 DIAGNOSIS — I6529 Occlusion and stenosis of unspecified carotid artery: Secondary | ICD-10-CM | POA: Diagnosis not present

## 2020-02-17 NOTE — Telephone Encounter (Signed)
Mrs Jimmy [name] [name] signed) left v/m ;pt has been taking immodium and the pink stuff (? Pepto bismol) with no relief of diarrhea. Mrs Jimmy [name] request different med for diarrhea. [facility]; I spoke with pts wife; pt has had diarrhea for  6 months. I spoke with pts aide; last 24 hours had 4 episodes of diarrhea that is not watery and has some form; no fever and no abd pain. Pt has been taking liquid immodium taking 1 1/2 medicine cups bid without relief of diarrhea. Now the diarrhea is ongoing for 6 months. Immodium was helping but not now. Mrs Jimmy [name] request cb.

## 2020-02-17 NOTE — Telephone Encounter (Signed)
Spoke with pt's wife, [name] (on dpr) asking how many mL is in each cup.  States it is 30 mL.  He's taking 1mg /7.76mL concentration.  It works for several days then diarrhea comes back.  I relayed Dr. Synthia Innocent message.  Ms. Stutz verbalizes understanding.  FYI to Dr. Darnell Level.

## 2020-02-17 NOTE — Telephone Encounter (Signed)
How many mL in each cup? And what concentration imodium is she using?  Liquid imodium solution usually comes in 1mg /37mL dose or 1mg /7.21mL dose, and normal dose is 4mg  up to 3 times a day to help with chronic diarrhea.   So they could to use up to 30mL per dose (if 1mg /7.84mL dose), 3 times a day - but would want to verify what concentration she's using.

## 2020-02-19 DIAGNOSIS — I130 Hypertensive heart and chronic kidney disease with heart failure and stage 1 through stage 4 chronic kidney disease, or unspecified chronic kidney disease: Secondary | ICD-10-CM | POA: Diagnosis not present

## 2020-02-19 DIAGNOSIS — I25708 Atherosclerosis of coronary artery bypass graft(s), unspecified, with other forms of angina pectoris: Secondary | ICD-10-CM | POA: Diagnosis not present

## 2020-02-19 DIAGNOSIS — I5032 Chronic diastolic (congestive) heart failure: Secondary | ICD-10-CM | POA: Diagnosis not present

## 2020-02-19 DIAGNOSIS — F0150 Vascular dementia without behavioral disturbance: Secondary | ICD-10-CM | POA: Diagnosis not present

## 2020-02-19 DIAGNOSIS — N183 Chronic kidney disease, stage 3 unspecified: Secondary | ICD-10-CM | POA: Diagnosis not present

## 2020-02-19 DIAGNOSIS — I6529 Occlusion and stenosis of unspecified carotid artery: Secondary | ICD-10-CM | POA: Diagnosis not present

## 2020-02-21 ENCOUNTER — Other Ambulatory Visit: Payer: Self-pay | Admitting: Family Medicine

## 2020-02-21 NOTE — Telephone Encounter (Signed)
E-scribed refill.  Plz schedule wellness, cpe and lab visits.  

## 2020-02-22 DIAGNOSIS — F0150 Vascular dementia without behavioral disturbance: Secondary | ICD-10-CM | POA: Diagnosis not present

## 2020-02-22 DIAGNOSIS — I130 Hypertensive heart and chronic kidney disease with heart failure and stage 1 through stage 4 chronic kidney disease, or unspecified chronic kidney disease: Secondary | ICD-10-CM | POA: Diagnosis not present

## 2020-02-22 DIAGNOSIS — I25708 Atherosclerosis of coronary artery bypass graft(s), unspecified, with other forms of angina pectoris: Secondary | ICD-10-CM | POA: Diagnosis not present

## 2020-02-22 DIAGNOSIS — N183 Chronic kidney disease, stage 3 unspecified: Secondary | ICD-10-CM | POA: Diagnosis not present

## 2020-02-22 DIAGNOSIS — I5032 Chronic diastolic (congestive) heart failure: Secondary | ICD-10-CM | POA: Diagnosis not present

## 2020-02-22 DIAGNOSIS — I6529 Occlusion and stenosis of unspecified carotid artery: Secondary | ICD-10-CM | POA: Diagnosis not present

## 2020-02-23 ENCOUNTER — Telehealth: Payer: Self-pay | Admitting: Family Medicine

## 2020-02-23 NOTE — Telephone Encounter (Signed)
Received request to schedule cpe, mwv, and labs. Patient is in hospice care. Ok'd by Dr. Darnell Level to do virtual visit for routine followup patient's wife declined. EM

## 2020-02-25 DIAGNOSIS — I25708 Atherosclerosis of coronary artery bypass graft(s), unspecified, with other forms of angina pectoris: Secondary | ICD-10-CM | POA: Diagnosis not present

## 2020-02-25 DIAGNOSIS — N183 Chronic kidney disease, stage 3 unspecified: Secondary | ICD-10-CM | POA: Diagnosis not present

## 2020-02-25 DIAGNOSIS — I5032 Chronic diastolic (congestive) heart failure: Secondary | ICD-10-CM | POA: Diagnosis not present

## 2020-02-25 DIAGNOSIS — I130 Hypertensive heart and chronic kidney disease with heart failure and stage 1 through stage 4 chronic kidney disease, or unspecified chronic kidney disease: Secondary | ICD-10-CM | POA: Diagnosis not present

## 2020-02-25 DIAGNOSIS — I6529 Occlusion and stenosis of unspecified carotid artery: Secondary | ICD-10-CM | POA: Diagnosis not present

## 2020-02-25 DIAGNOSIS — F0150 Vascular dementia without behavioral disturbance: Secondary | ICD-10-CM | POA: Diagnosis not present

## 2020-03-01 DIAGNOSIS — I739 Peripheral vascular disease, unspecified: Secondary | ICD-10-CM | POA: Diagnosis not present

## 2020-03-01 DIAGNOSIS — E538 Deficiency of other specified B group vitamins: Secondary | ICD-10-CM | POA: Diagnosis not present

## 2020-03-01 DIAGNOSIS — R221 Localized swelling, mass and lump, neck: Secondary | ICD-10-CM | POA: Diagnosis not present

## 2020-03-01 DIAGNOSIS — L89611 Pressure ulcer of right heel, stage 1: Secondary | ICD-10-CM | POA: Diagnosis not present

## 2020-03-01 DIAGNOSIS — I5032 Chronic diastolic (congestive) heart failure: Secondary | ICD-10-CM | POA: Diagnosis not present

## 2020-03-01 DIAGNOSIS — L89891 Pressure ulcer of other site, stage 1: Secondary | ICD-10-CM | POA: Diagnosis not present

## 2020-03-01 DIAGNOSIS — Z741 Need for assistance with personal care: Secondary | ICD-10-CM | POA: Diagnosis not present

## 2020-03-01 DIAGNOSIS — L8941 Pressure ulcer of contiguous site of back, buttock and hip, stage 1: Secondary | ICD-10-CM | POA: Diagnosis not present

## 2020-03-01 DIAGNOSIS — F0150 Vascular dementia without behavioral disturbance: Secondary | ICD-10-CM | POA: Diagnosis not present

## 2020-03-01 DIAGNOSIS — I6529 Occlusion and stenosis of unspecified carotid artery: Secondary | ICD-10-CM | POA: Diagnosis not present

## 2020-03-01 DIAGNOSIS — M109 Gout, unspecified: Secondary | ICD-10-CM | POA: Diagnosis not present

## 2020-03-01 DIAGNOSIS — Z6820 Body mass index (BMI) 20.0-20.9, adult: Secondary | ICD-10-CM | POA: Diagnosis not present

## 2020-03-01 DIAGNOSIS — K219 Gastro-esophageal reflux disease without esophagitis: Secondary | ICD-10-CM | POA: Diagnosis not present

## 2020-03-01 DIAGNOSIS — I25708 Atherosclerosis of coronary artery bypass graft(s), unspecified, with other forms of angina pectoris: Secondary | ICD-10-CM | POA: Diagnosis not present

## 2020-03-01 DIAGNOSIS — R32 Unspecified urinary incontinence: Secondary | ICD-10-CM | POA: Diagnosis not present

## 2020-03-01 DIAGNOSIS — I130 Hypertensive heart and chronic kidney disease with heart failure and stage 1 through stage 4 chronic kidney disease, or unspecified chronic kidney disease: Secondary | ICD-10-CM | POA: Diagnosis not present

## 2020-03-01 DIAGNOSIS — R1310 Dysphagia, unspecified: Secondary | ICD-10-CM | POA: Diagnosis not present

## 2020-03-01 DIAGNOSIS — E785 Hyperlipidemia, unspecified: Secondary | ICD-10-CM | POA: Diagnosis not present

## 2020-03-01 DIAGNOSIS — D696 Thrombocytopenia, unspecified: Secondary | ICD-10-CM | POA: Diagnosis not present

## 2020-03-01 DIAGNOSIS — Z8673 Personal history of transient ischemic attack (TIA), and cerebral infarction without residual deficits: Secondary | ICD-10-CM | POA: Diagnosis not present

## 2020-03-01 DIAGNOSIS — G4700 Insomnia, unspecified: Secondary | ICD-10-CM | POA: Diagnosis not present

## 2020-03-01 DIAGNOSIS — R159 Full incontinence of feces: Secondary | ICD-10-CM | POA: Diagnosis not present

## 2020-03-01 DIAGNOSIS — N183 Chronic kidney disease, stage 3 unspecified: Secondary | ICD-10-CM | POA: Diagnosis not present

## 2020-03-01 DIAGNOSIS — R634 Abnormal weight loss: Secondary | ICD-10-CM | POA: Diagnosis not present

## 2020-03-01 DIAGNOSIS — L89522 Pressure ulcer of left ankle, stage 2: Secondary | ICD-10-CM | POA: Diagnosis not present

## 2020-03-02 DIAGNOSIS — I25708 Atherosclerosis of coronary artery bypass graft(s), unspecified, with other forms of angina pectoris: Secondary | ICD-10-CM | POA: Diagnosis not present

## 2020-03-02 DIAGNOSIS — F0150 Vascular dementia without behavioral disturbance: Secondary | ICD-10-CM | POA: Diagnosis not present

## 2020-03-02 DIAGNOSIS — I130 Hypertensive heart and chronic kidney disease with heart failure and stage 1 through stage 4 chronic kidney disease, or unspecified chronic kidney disease: Secondary | ICD-10-CM | POA: Diagnosis not present

## 2020-03-02 DIAGNOSIS — N183 Chronic kidney disease, stage 3 unspecified: Secondary | ICD-10-CM | POA: Diagnosis not present

## 2020-03-02 DIAGNOSIS — I5032 Chronic diastolic (congestive) heart failure: Secondary | ICD-10-CM | POA: Diagnosis not present

## 2020-03-02 DIAGNOSIS — I6529 Occlusion and stenosis of unspecified carotid artery: Secondary | ICD-10-CM | POA: Diagnosis not present

## 2020-03-03 ENCOUNTER — Telehealth: Payer: Self-pay

## 2020-03-03 MED ORDER — HYDROCORTISONE ACETATE 25 MG RE SUPP: 25.0000 mg | Freq: Two times a day (BID) | RECTAL | 0 refills | Status: AC | PRN

## 2020-03-03 MED ORDER — PEPTO-BISMOL 524 MG/30ML PO SUSP: 30.0000 mL | Freq: Four times a day (QID) | ORAL | 0 refills | Status: AC | PRN

## 2020-03-03 MED ORDER — SIMETHICONE 80 MG PO CHEW: 80.0000 mg | CHEWABLE_TABLET | Freq: Four times a day (QID) | ORAL | 0 refills | Status: AC | PRN

## 2020-03-03 NOTE — Telephone Encounter (Signed)
Sent in   Thanks

## 2020-03-03 NOTE — Telephone Encounter (Signed)
[place] nurse left v/m that pt needs med orders sent to [facility] [address]; Jimmy [name] is requesting med orders for  Simethicone for gas;  Hydrocortisone supplement for hemorrhoids pepto bismol for loose stools; Jimmy [name] request cb when done.

## 2020-03-09 DIAGNOSIS — F0150 Vascular dementia without behavioral disturbance: Secondary | ICD-10-CM | POA: Diagnosis not present

## 2020-03-09 DIAGNOSIS — I25708 Atherosclerosis of coronary artery bypass graft(s), unspecified, with other forms of angina pectoris: Secondary | ICD-10-CM | POA: Diagnosis not present

## 2020-03-09 DIAGNOSIS — I5032 Chronic diastolic (congestive) heart failure: Secondary | ICD-10-CM | POA: Diagnosis not present

## 2020-03-09 DIAGNOSIS — I6529 Occlusion and stenosis of unspecified carotid artery: Secondary | ICD-10-CM | POA: Diagnosis not present

## 2020-03-09 DIAGNOSIS — I130 Hypertensive heart and chronic kidney disease with heart failure and stage 1 through stage 4 chronic kidney disease, or unspecified chronic kidney disease: Secondary | ICD-10-CM | POA: Diagnosis not present

## 2020-03-09 DIAGNOSIS — N183 Chronic kidney disease, stage 3 unspecified: Secondary | ICD-10-CM | POA: Diagnosis not present

## 2020-03-11 ENCOUNTER — Other Ambulatory Visit: Payer: Self-pay | Admitting: Family Medicine

## 2020-03-13 DIAGNOSIS — N183 Chronic kidney disease, stage 3 unspecified: Secondary | ICD-10-CM | POA: Diagnosis not present

## 2020-03-13 DIAGNOSIS — F0150 Vascular dementia without behavioral disturbance: Secondary | ICD-10-CM | POA: Diagnosis not present

## 2020-03-13 DIAGNOSIS — I25708 Atherosclerosis of coronary artery bypass graft(s), unspecified, with other forms of angina pectoris: Secondary | ICD-10-CM | POA: Diagnosis not present

## 2020-03-13 DIAGNOSIS — I130 Hypertensive heart and chronic kidney disease with heart failure and stage 1 through stage 4 chronic kidney disease, or unspecified chronic kidney disease: Secondary | ICD-10-CM | POA: Diagnosis not present

## 2020-03-13 DIAGNOSIS — I5032 Chronic diastolic (congestive) heart failure: Secondary | ICD-10-CM | POA: Diagnosis not present

## 2020-03-13 DIAGNOSIS — I6529 Occlusion and stenosis of unspecified carotid artery: Secondary | ICD-10-CM | POA: Diagnosis not present

## 2020-03-14 DIAGNOSIS — I6529 Occlusion and stenosis of unspecified carotid artery: Secondary | ICD-10-CM | POA: Diagnosis not present

## 2020-03-14 DIAGNOSIS — F0150 Vascular dementia without behavioral disturbance: Secondary | ICD-10-CM | POA: Diagnosis not present

## 2020-03-14 DIAGNOSIS — I25708 Atherosclerosis of coronary artery bypass graft(s), unspecified, with other forms of angina pectoris: Secondary | ICD-10-CM | POA: Diagnosis not present

## 2020-03-14 DIAGNOSIS — N183 Chronic kidney disease, stage 3 unspecified: Secondary | ICD-10-CM | POA: Diagnosis not present

## 2020-03-14 DIAGNOSIS — I130 Hypertensive heart and chronic kidney disease with heart failure and stage 1 through stage 4 chronic kidney disease, or unspecified chronic kidney disease: Secondary | ICD-10-CM | POA: Diagnosis not present

## 2020-03-14 DIAGNOSIS — I5032 Chronic diastolic (congestive) heart failure: Secondary | ICD-10-CM | POA: Diagnosis not present

## 2020-03-16 DIAGNOSIS — I6529 Occlusion and stenosis of unspecified carotid artery: Secondary | ICD-10-CM | POA: Diagnosis not present

## 2020-03-16 DIAGNOSIS — I25708 Atherosclerosis of coronary artery bypass graft(s), unspecified, with other forms of angina pectoris: Secondary | ICD-10-CM | POA: Diagnosis not present

## 2020-03-16 DIAGNOSIS — N183 Chronic kidney disease, stage 3 unspecified: Secondary | ICD-10-CM | POA: Diagnosis not present

## 2020-03-16 DIAGNOSIS — I130 Hypertensive heart and chronic kidney disease with heart failure and stage 1 through stage 4 chronic kidney disease, or unspecified chronic kidney disease: Secondary | ICD-10-CM | POA: Diagnosis not present

## 2020-03-16 DIAGNOSIS — F0150 Vascular dementia without behavioral disturbance: Secondary | ICD-10-CM | POA: Diagnosis not present

## 2020-03-16 DIAGNOSIS — I5032 Chronic diastolic (congestive) heart failure: Secondary | ICD-10-CM | POA: Diagnosis not present

## 2020-03-18 DIAGNOSIS — I6529 Occlusion and stenosis of unspecified carotid artery: Secondary | ICD-10-CM | POA: Diagnosis not present

## 2020-03-18 DIAGNOSIS — N183 Chronic kidney disease, stage 3 unspecified: Secondary | ICD-10-CM | POA: Diagnosis not present

## 2020-03-18 DIAGNOSIS — I25708 Atherosclerosis of coronary artery bypass graft(s), unspecified, with other forms of angina pectoris: Secondary | ICD-10-CM | POA: Diagnosis not present

## 2020-03-18 DIAGNOSIS — I130 Hypertensive heart and chronic kidney disease with heart failure and stage 1 through stage 4 chronic kidney disease, or unspecified chronic kidney disease: Secondary | ICD-10-CM | POA: Diagnosis not present

## 2020-03-18 DIAGNOSIS — F0150 Vascular dementia without behavioral disturbance: Secondary | ICD-10-CM | POA: Diagnosis not present

## 2020-03-18 DIAGNOSIS — I5032 Chronic diastolic (congestive) heart failure: Secondary | ICD-10-CM | POA: Diagnosis not present

## 2020-03-20 DIAGNOSIS — C9110 Chronic lymphocytic leukemia of B-cell type not having achieved remission: Secondary | ICD-10-CM | POA: Diagnosis not present

## 2020-03-20 DIAGNOSIS — I129 Hypertensive chronic kidney disease with stage 1 through stage 4 chronic kidney disease, or unspecified chronic kidney disease: Secondary | ICD-10-CM | POA: Diagnosis not present

## 2020-03-20 DIAGNOSIS — N189 Chronic kidney disease, unspecified: Secondary | ICD-10-CM | POA: Diagnosis not present

## 2020-03-20 DIAGNOSIS — F0150 Vascular dementia without behavioral disturbance: Secondary | ICD-10-CM | POA: Diagnosis not present

## 2020-03-20 DIAGNOSIS — Z85828 Personal history of other malignant neoplasm of skin: Secondary | ICD-10-CM | POA: Diagnosis not present

## 2020-03-20 DIAGNOSIS — E790 Hyperuricemia without signs of inflammatory arthritis and tophaceous disease: Secondary | ICD-10-CM | POA: Diagnosis not present

## 2020-03-20 DIAGNOSIS — I6529 Occlusion and stenosis of unspecified carotid artery: Secondary | ICD-10-CM | POA: Diagnosis not present

## 2020-03-20 DIAGNOSIS — I130 Hypertensive heart and chronic kidney disease with heart failure and stage 1 through stage 4 chronic kidney disease, or unspecified chronic kidney disease: Secondary | ICD-10-CM | POA: Diagnosis not present

## 2020-03-20 DIAGNOSIS — I25708 Atherosclerosis of coronary artery bypass graft(s), unspecified, with other forms of angina pectoris: Secondary | ICD-10-CM | POA: Diagnosis not present

## 2020-03-20 DIAGNOSIS — N183 Chronic kidney disease, stage 3 unspecified: Secondary | ICD-10-CM | POA: Diagnosis not present

## 2020-03-20 DIAGNOSIS — I5032 Chronic diastolic (congestive) heart failure: Secondary | ICD-10-CM | POA: Diagnosis not present

## 2020-03-20 DIAGNOSIS — C4492 Squamous cell carcinoma of skin, unspecified: Secondary | ICD-10-CM | POA: Diagnosis not present

## 2020-03-20 DIAGNOSIS — B182 Chronic viral hepatitis C: Secondary | ICD-10-CM | POA: Diagnosis not present

## 2020-03-23 DIAGNOSIS — I130 Hypertensive heart and chronic kidney disease with heart failure and stage 1 through stage 4 chronic kidney disease, or unspecified chronic kidney disease: Secondary | ICD-10-CM | POA: Diagnosis not present

## 2020-03-23 DIAGNOSIS — F0150 Vascular dementia without behavioral disturbance: Secondary | ICD-10-CM | POA: Diagnosis not present

## 2020-03-23 DIAGNOSIS — I25708 Atherosclerosis of coronary artery bypass graft(s), unspecified, with other forms of angina pectoris: Secondary | ICD-10-CM | POA: Diagnosis not present

## 2020-03-23 DIAGNOSIS — N183 Chronic kidney disease, stage 3 unspecified: Secondary | ICD-10-CM | POA: Diagnosis not present

## 2020-03-23 DIAGNOSIS — I6529 Occlusion and stenosis of unspecified carotid artery: Secondary | ICD-10-CM | POA: Diagnosis not present

## 2020-03-23 DIAGNOSIS — I5032 Chronic diastolic (congestive) heart failure: Secondary | ICD-10-CM | POA: Diagnosis not present

## 2020-03-28 DIAGNOSIS — I25708 Atherosclerosis of coronary artery bypass graft(s), unspecified, with other forms of angina pectoris: Secondary | ICD-10-CM | POA: Diagnosis not present

## 2020-03-28 DIAGNOSIS — N183 Chronic kidney disease, stage 3 unspecified: Secondary | ICD-10-CM | POA: Diagnosis not present

## 2020-03-28 DIAGNOSIS — I5032 Chronic diastolic (congestive) heart failure: Secondary | ICD-10-CM | POA: Diagnosis not present

## 2020-03-28 DIAGNOSIS — I130 Hypertensive heart and chronic kidney disease with heart failure and stage 1 through stage 4 chronic kidney disease, or unspecified chronic kidney disease: Secondary | ICD-10-CM | POA: Diagnosis not present

## 2020-03-28 DIAGNOSIS — F0150 Vascular dementia without behavioral disturbance: Secondary | ICD-10-CM | POA: Diagnosis not present

## 2020-03-28 DIAGNOSIS — I6529 Occlusion and stenosis of unspecified carotid artery: Secondary | ICD-10-CM | POA: Diagnosis not present

## 2020-04-01 DIAGNOSIS — F0150 Vascular dementia without behavioral disturbance: Secondary | ICD-10-CM | POA: Diagnosis not present

## 2020-04-01 DIAGNOSIS — E785 Hyperlipidemia, unspecified: Secondary | ICD-10-CM | POA: Diagnosis not present

## 2020-04-01 DIAGNOSIS — L89891 Pressure ulcer of other site, stage 1: Secondary | ICD-10-CM | POA: Diagnosis not present

## 2020-04-01 DIAGNOSIS — I5032 Chronic diastolic (congestive) heart failure: Secondary | ICD-10-CM | POA: Diagnosis not present

## 2020-04-01 DIAGNOSIS — Z6820 Body mass index (BMI) 20.0-20.9, adult: Secondary | ICD-10-CM | POA: Diagnosis not present

## 2020-04-01 DIAGNOSIS — L89522 Pressure ulcer of left ankle, stage 2: Secondary | ICD-10-CM | POA: Diagnosis not present

## 2020-04-01 DIAGNOSIS — Z8673 Personal history of transient ischemic attack (TIA), and cerebral infarction without residual deficits: Secondary | ICD-10-CM | POA: Diagnosis not present

## 2020-04-01 DIAGNOSIS — R32 Unspecified urinary incontinence: Secondary | ICD-10-CM | POA: Diagnosis not present

## 2020-04-01 DIAGNOSIS — N183 Chronic kidney disease, stage 3 unspecified: Secondary | ICD-10-CM | POA: Diagnosis not present

## 2020-04-01 DIAGNOSIS — G4700 Insomnia, unspecified: Secondary | ICD-10-CM | POA: Diagnosis not present

## 2020-04-01 DIAGNOSIS — E538 Deficiency of other specified B group vitamins: Secondary | ICD-10-CM | POA: Diagnosis not present

## 2020-04-01 DIAGNOSIS — R634 Abnormal weight loss: Secondary | ICD-10-CM | POA: Diagnosis not present

## 2020-04-01 DIAGNOSIS — R1310 Dysphagia, unspecified: Secondary | ICD-10-CM | POA: Diagnosis not present

## 2020-04-01 DIAGNOSIS — R221 Localized swelling, mass and lump, neck: Secondary | ICD-10-CM | POA: Diagnosis not present

## 2020-04-01 DIAGNOSIS — K219 Gastro-esophageal reflux disease without esophagitis: Secondary | ICD-10-CM | POA: Diagnosis not present

## 2020-04-01 DIAGNOSIS — I25708 Atherosclerosis of coronary artery bypass graft(s), unspecified, with other forms of angina pectoris: Secondary | ICD-10-CM | POA: Diagnosis not present

## 2020-04-01 DIAGNOSIS — D696 Thrombocytopenia, unspecified: Secondary | ICD-10-CM | POA: Diagnosis not present

## 2020-04-01 DIAGNOSIS — M109 Gout, unspecified: Secondary | ICD-10-CM | POA: Diagnosis not present

## 2020-04-01 DIAGNOSIS — L89611 Pressure ulcer of right heel, stage 1: Secondary | ICD-10-CM | POA: Diagnosis not present

## 2020-04-01 DIAGNOSIS — I130 Hypertensive heart and chronic kidney disease with heart failure and stage 1 through stage 4 chronic kidney disease, or unspecified chronic kidney disease: Secondary | ICD-10-CM | POA: Diagnosis not present

## 2020-04-01 DIAGNOSIS — Z741 Need for assistance with personal care: Secondary | ICD-10-CM | POA: Diagnosis not present

## 2020-04-01 DIAGNOSIS — I6529 Occlusion and stenosis of unspecified carotid artery: Secondary | ICD-10-CM | POA: Diagnosis not present

## 2020-04-01 DIAGNOSIS — I739 Peripheral vascular disease, unspecified: Secondary | ICD-10-CM | POA: Diagnosis not present

## 2020-04-01 DIAGNOSIS — L8941 Pressure ulcer of contiguous site of back, buttock and hip, stage 1: Secondary | ICD-10-CM | POA: Diagnosis not present

## 2020-04-01 DIAGNOSIS — R159 Full incontinence of feces: Secondary | ICD-10-CM | POA: Diagnosis not present

## 2020-04-06 DIAGNOSIS — H04123 Dry eye syndrome of bilateral lacrimal glands: Secondary | ICD-10-CM | POA: Diagnosis not present

## 2020-04-06 DIAGNOSIS — Z961 Presence of intraocular lens: Secondary | ICD-10-CM | POA: Diagnosis not present

## 2020-04-07 DIAGNOSIS — I5032 Chronic diastolic (congestive) heart failure: Secondary | ICD-10-CM | POA: Diagnosis not present

## 2020-04-07 DIAGNOSIS — I6529 Occlusion and stenosis of unspecified carotid artery: Secondary | ICD-10-CM | POA: Diagnosis not present

## 2020-04-07 DIAGNOSIS — F0150 Vascular dementia without behavioral disturbance: Secondary | ICD-10-CM | POA: Diagnosis not present

## 2020-04-07 DIAGNOSIS — N183 Chronic kidney disease, stage 3 unspecified: Secondary | ICD-10-CM | POA: Diagnosis not present

## 2020-04-07 DIAGNOSIS — I130 Hypertensive heart and chronic kidney disease with heart failure and stage 1 through stage 4 chronic kidney disease, or unspecified chronic kidney disease: Secondary | ICD-10-CM | POA: Diagnosis not present

## 2020-04-07 DIAGNOSIS — I25708 Atherosclerosis of coronary artery bypass graft(s), unspecified, with other forms of angina pectoris: Secondary | ICD-10-CM | POA: Diagnosis not present

## 2020-04-11 ENCOUNTER — Other Ambulatory Visit: Payer: Self-pay

## 2020-04-11 ENCOUNTER — Ambulatory Visit (INDEPENDENT_AMBULATORY_CARE_PROVIDER_SITE_OTHER): Payer: Medicare Other | Admitting: Podiatry

## 2020-04-11 DIAGNOSIS — B351 Tinea unguium: Secondary | ICD-10-CM | POA: Diagnosis not present

## 2020-04-11 DIAGNOSIS — M79676 Pain in unspecified toe(s): Secondary | ICD-10-CM

## 2020-04-11 NOTE — Progress Notes (Signed)
HPI: 87 y.o. male presenting today with his wife for evaluation of redness to the right great toe.  The patient does have some pain associated with the toenails.  Patient is wheelchair-bound and he is unable to trim his own nails and they are very sensitive and tender to palpation and with shoe gear.  Past Medical History:  Diagnosis Date  . Actinic keratosis   . Basal cell carcinoma 02/15/2016   Right lateral neck infraauricular of SCC scar  . Basal cell carcinoma 02/15/2016   Left chin  . Basal cell carcinoma 03/08/2014   Right of midline forehead  . CAD (coronary artery disease)    a. 04/2001 S/P CABGx 4;  b. 2008 MV:  EF 63% normal perfusion.  . Carotid stenosis    a. 11/2012 Carotid U/S:  RICA 14-43%, LICA 15-40%.  . Chronic kidney disease, stage 3 ([place])   . CLL (chronic lymphoblastic leukemia) 2009   Stage IV; Dr. Ivor Messier - referred to Dr. Lissa Merlin [facility] 07/2013 now on Rituxan (10/2013) --> stage 0 (09/2014)  . Diastolic CHF ([place])    a. 0/8676 Echo: EF 55-65%, mild conc LVH, Gr 1 DD, mild AS, triv AI, mildly dil Ao root (3.5 cm).  . GERD (gastroesophageal reflux disease) 1990s  . History of CVA (cerebrovascular accident) 2015   by MRI - remote L internal capsule  . History of herpes genitalis    valtrex daily  . Hyperlipemia 2002  . Hypertension 2002  . Mass of submandibular region 2015   referred to gen surg after chemo  . Mild aortic stenosis    a. 07/2011 Echo: Mild AS, triv AI.  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 Skin cancer   . Squamous cell carcinoma of skin 10/05/2014   Right mid posterior ear  . Stroke ([place])   . Thrombocytopenia ([place])    outpatient goals Hgb >9, plt >20  . TIA (transient ischemic attack)    04/2016     Physical Exam: General: The patient is alert and oriented x3 in no acute distress.  Dermatology: Dystrophic nails noted to the right great toe with evidence of a new nail growth that is pushing out old nail growth.   The base of the nail plates bilateral do appear to be erythematous.  There is actually some drainage noted to the left hallux nail plate base.  Skin is warm, dry and supple bilateral lower extremities.  Hyperkeratotic dystrophic discolored nails also noted 2-5 bilateral  Vascular: Palpable pedal pulses bilaterally. Capillary refill within normal limits.  Neurological: Epicritic and protective threshold grossly intact bilaterally.   Musculoskeletal Exam: Range of motion within normal limits to all pedal and ankle joints bilateral. Muscle strength 5/5 in all groups bilateral.   Assessment: 1.  Dystrophic nail with associated tenderness to palpation and onycholysis right hallux nail plate  2.  Pain due to onychomycosis of toenail bilateral 3.  History of left hallux nail plate temporary total nail avulsion.  June 2021  Plan of Care:  1. Patient evaluated.  2. Mechanical debridement of nails 2-5 bilateral was performed using a nail nipper without incident or bleeding  3.  Continue good supportive shoe gear and observe the feet daily for any changes 4.  Return to clinic in 3 months     Edrick Kins, DPM [facility]  Dr. Edrick Kins, DPM    [address]  [place], [place] [address]                Office [phone]  Fax [phone]

## 2020-04-12 DIAGNOSIS — I25708 Atherosclerosis of coronary artery bypass graft(s), unspecified, with other forms of angina pectoris: Secondary | ICD-10-CM | POA: Diagnosis not present

## 2020-04-12 DIAGNOSIS — F0150 Vascular dementia without behavioral disturbance: Secondary | ICD-10-CM | POA: Diagnosis not present

## 2020-04-12 DIAGNOSIS — I5032 Chronic diastolic (congestive) heart failure: Secondary | ICD-10-CM | POA: Diagnosis not present

## 2020-04-12 DIAGNOSIS — I130 Hypertensive heart and chronic kidney disease with heart failure and stage 1 through stage 4 chronic kidney disease, or unspecified chronic kidney disease: Secondary | ICD-10-CM | POA: Diagnosis not present

## 2020-04-12 DIAGNOSIS — I6529 Occlusion and stenosis of unspecified carotid artery: Secondary | ICD-10-CM | POA: Diagnosis not present

## 2020-04-12 DIAGNOSIS — N183 Chronic kidney disease, stage 3 unspecified: Secondary | ICD-10-CM | POA: Diagnosis not present

## 2020-04-15 DIAGNOSIS — N183 Chronic kidney disease, stage 3 unspecified: Secondary | ICD-10-CM | POA: Diagnosis not present

## 2020-04-15 DIAGNOSIS — I130 Hypertensive heart and chronic kidney disease with heart failure and stage 1 through stage 4 chronic kidney disease, or unspecified chronic kidney disease: Secondary | ICD-10-CM | POA: Diagnosis not present

## 2020-04-15 DIAGNOSIS — I6529 Occlusion and stenosis of unspecified carotid artery: Secondary | ICD-10-CM | POA: Diagnosis not present

## 2020-04-15 DIAGNOSIS — F0150 Vascular dementia without behavioral disturbance: Secondary | ICD-10-CM | POA: Diagnosis not present

## 2020-04-15 DIAGNOSIS — I25708 Atherosclerosis of coronary artery bypass graft(s), unspecified, with other forms of angina pectoris: Secondary | ICD-10-CM | POA: Diagnosis not present

## 2020-04-15 DIAGNOSIS — I5032 Chronic diastolic (congestive) heart failure: Secondary | ICD-10-CM | POA: Diagnosis not present

## 2020-04-18 ENCOUNTER — Telehealth: Payer: Self-pay | Admitting: *Deleted

## 2020-04-18 NOTE — Telephone Encounter (Signed)
Rita with pt's funeral home called to let us know pt passed away on 05/03/2020 in his home. They need a death certificate asap and wanted to see if PCP is in the [place] and can do it asap online or if they need to drop off a paper copy to have provider fill out. They need answer asap because pt is to be cremated but they can't proceed until death certificate is completed.  Please call Velva Harman back asap and let her know.  [phone]  Status updated

## 2020-04-18 NOTE — Telephone Encounter (Signed)
Per Dr. Darnell Level, drop off death certificate to be signed.    Spoke with Velva Harman, says she will get it to our office to be signed.

## 2020-04-19 NOTE — Telephone Encounter (Addendum)
Spoke with funeral home notifying them pt's death certificate is ready to pick up.    [Placed certificate on [employer].]

## 2020-04-19 NOTE — Telephone Encounter (Addendum)
Filled and in my out box.  Spoke with wife, expressed my condolences.

## 2020-04-19 NOTE — Telephone Encounter (Signed)
Death certificate was dropped off this morning. Placed on Lisa's desk to bring to Dr. Darnell Level for signature.

## 2020-04-21 ENCOUNTER — Other Ambulatory Visit: Payer: Self-pay | Admitting: Family Medicine

## 2020-05-02 DEATH — deceased

## 2020-12-06 IMAGING — CT CT HEAD W/O CM
3 series · 15 of 47 positions shown, 18 images · non-contrast
Comparison: Prior MRI and CT from 04/04/2018.

CLINICAL DATA: Follow-up examination for acute intracranial
hemorrhage, stroke.

EXAM:
CT HEAD WITHOUT CONTRAST
TECHNIQUE: Contiguous axial images were obtained from the base of the skull
through the vertex without intravenous contrast.

[Series 4: head 5.0 h30s · axial · 0.45mm/px · z∈[-33,+107]mm · 9 of 34 slices shown, 12 images]
[im 3/34  brain]
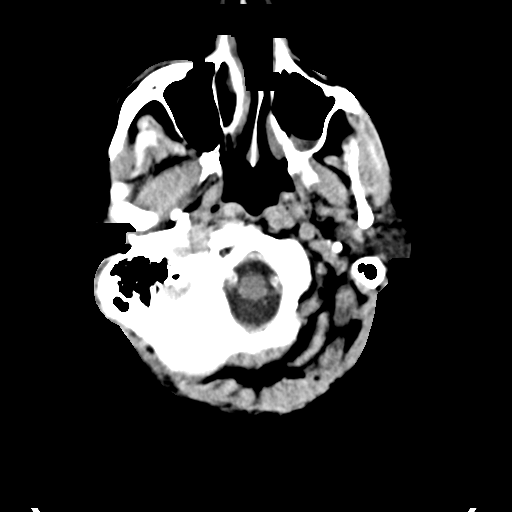
[im 3/34  bone]
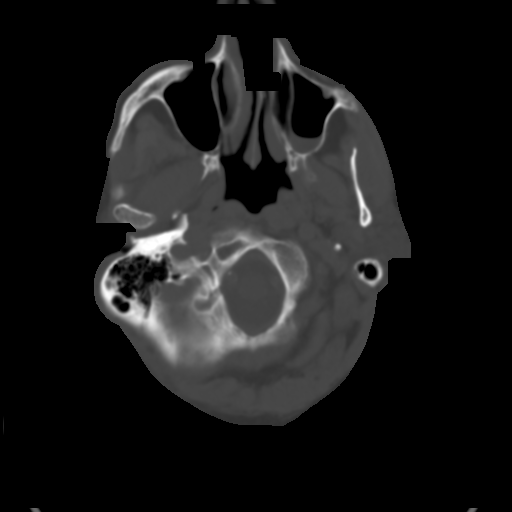
[im 6/34  brain]
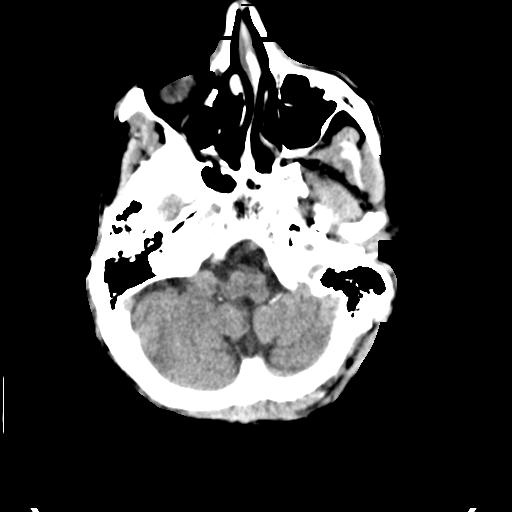
[im 10/34  brain]
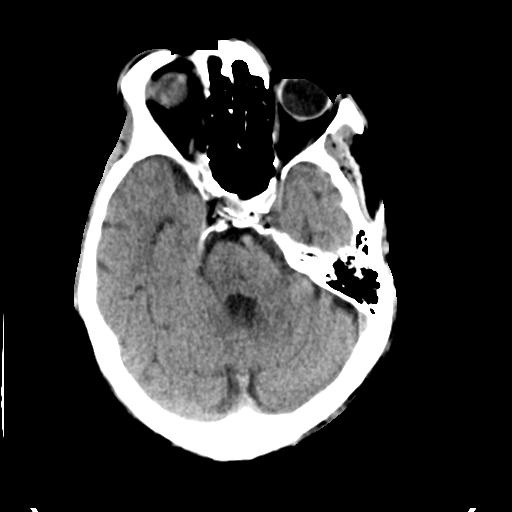
[im 13/34  brain]
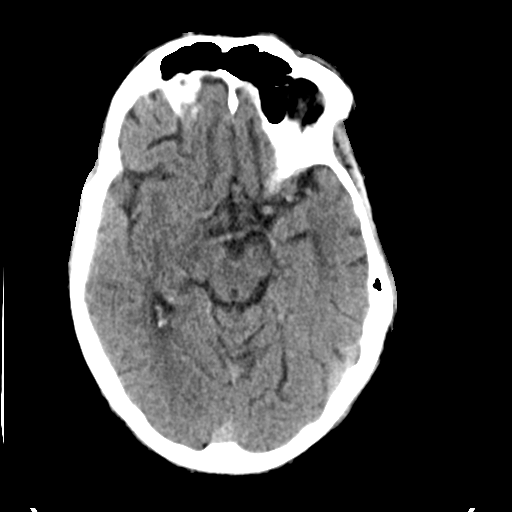
[im 18/34  brain]
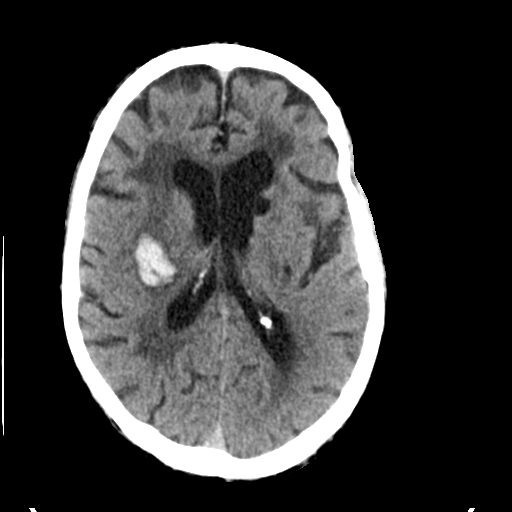
[im 18/34  bone]
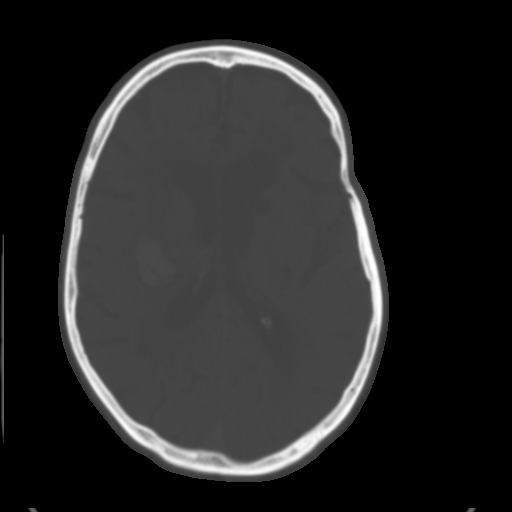
[im 21/34  brain]
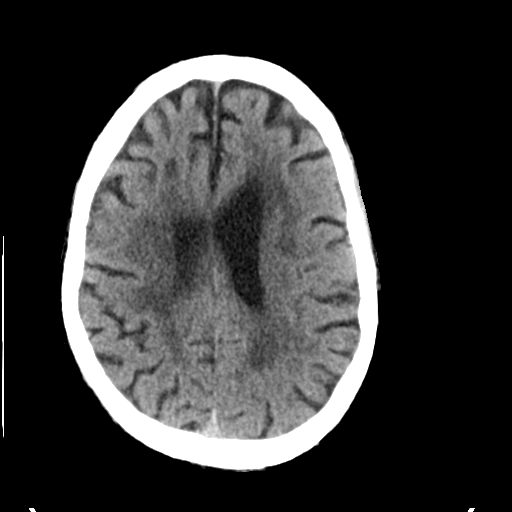
[im 24/34  brain]
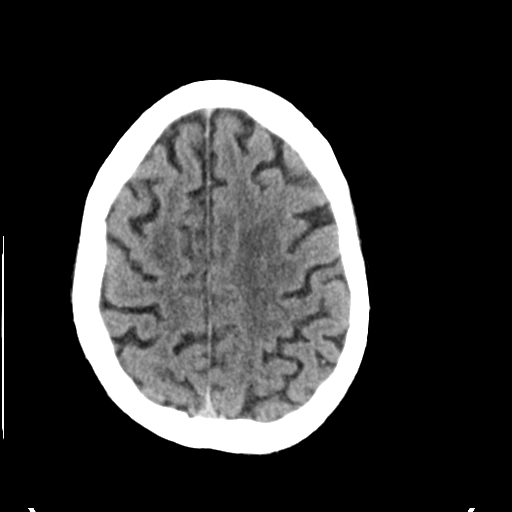
[im 28/34  brain]
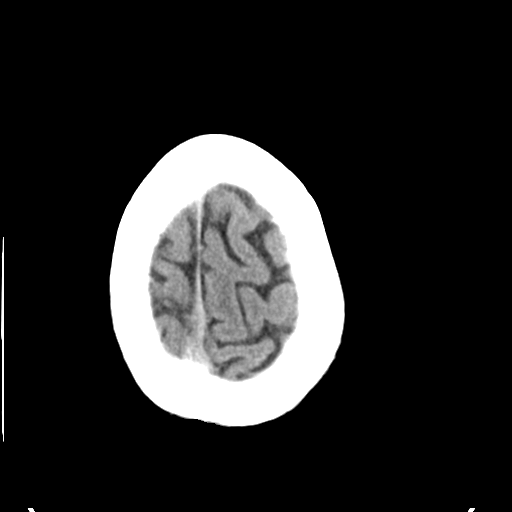
[im 31/34  brain]
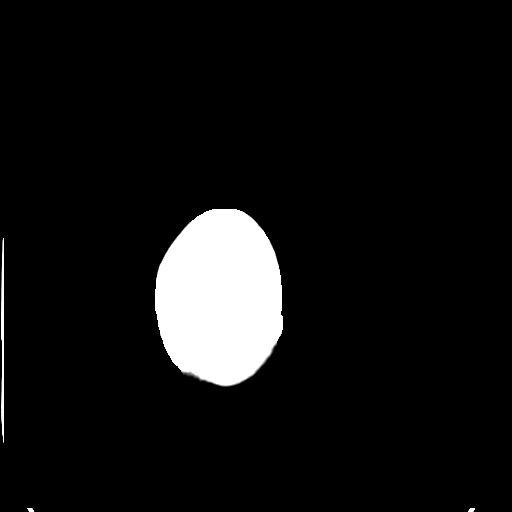
[im 31/34  bone]
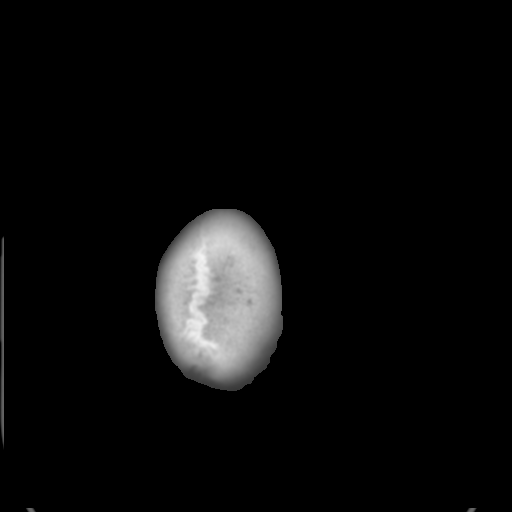

[Series 5: head 3.0 mpr cor · coronal · 0.32mm/px · 3 of 72 slices shown]
[im 24/72  brain]
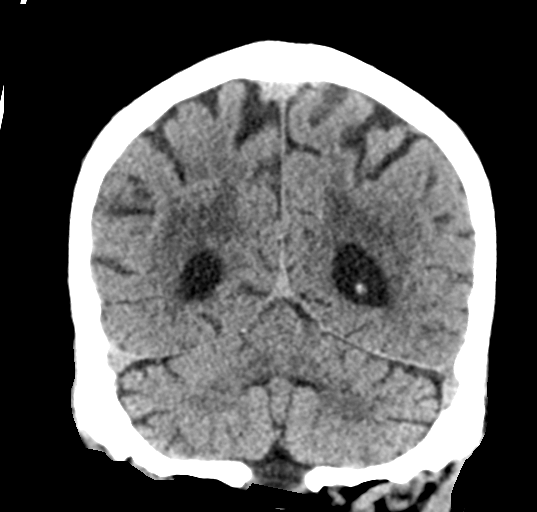
[im 32/72  brain]
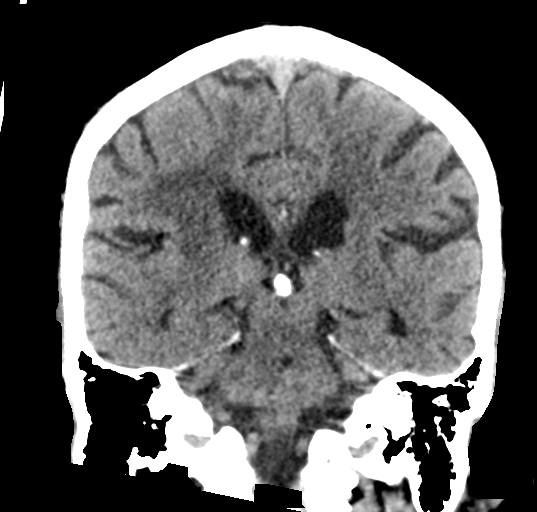
[im 40/72  brain]
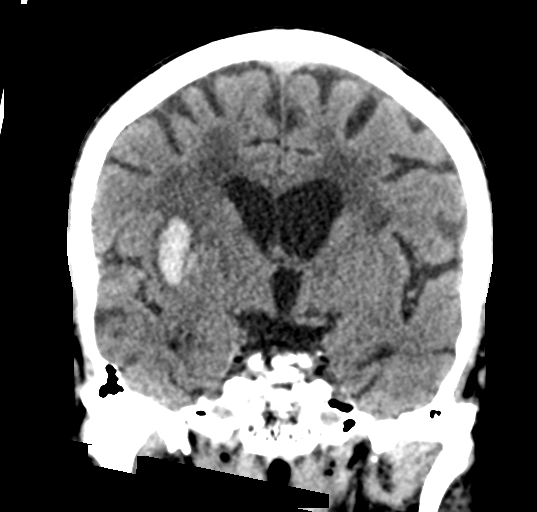

[Series 6: head 3.0 mpr sag · sagittal · 0.32mm/px · 3 of 56 slices shown]
[im 23/56  brain]
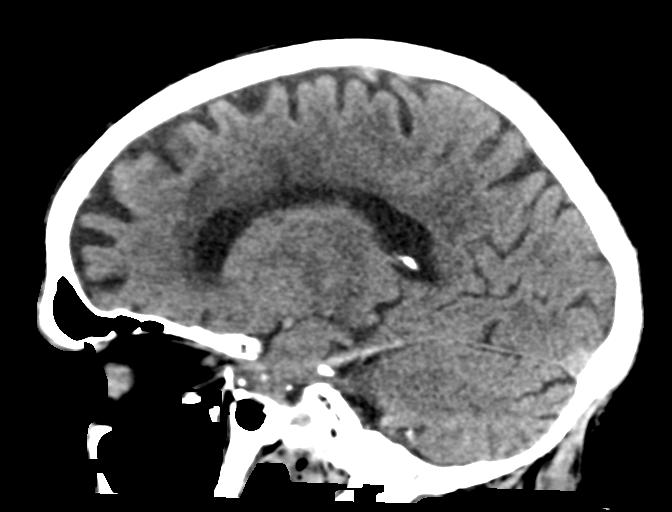
[im 28/56  brain]
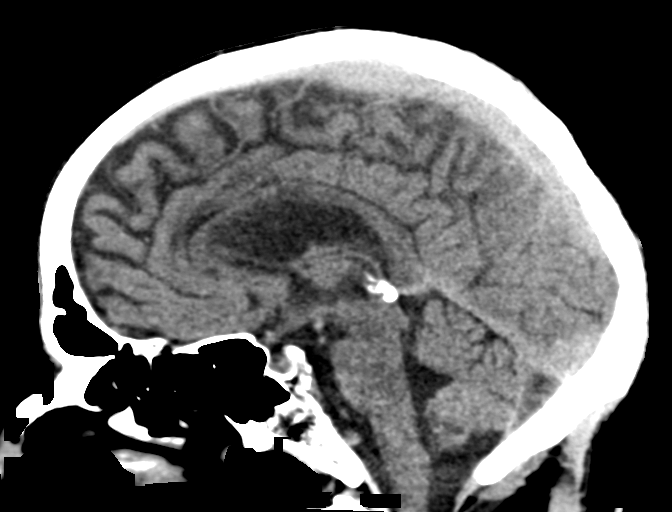
[im 34/56  brain]
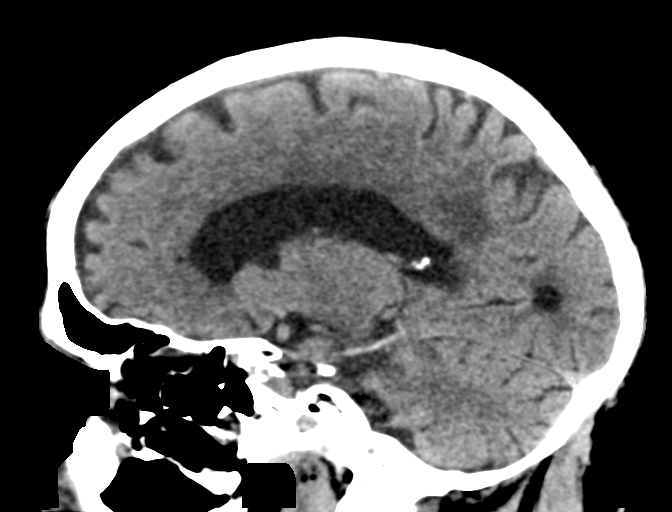

[15 of 47 positions shown; findings below may reference images not displayed]

FINDINGS: Brain: Intraparenchymal hemorrhage centered at the posterior right
lentiform nucleus slightly increased in size measuring 3.2 x 2.2 x
2.8 cm (estimated volume 9.9 cc, previously 8.8 cc on prior CT).
Localized vasogenic edema and regional mass effect slightly
increased from previous. No intraventricular extension or other
complication. No significant midline shift.

No other new acute intracranial hemorrhage. No other acute large
vessel territory infarct. Underlying atrophy with advanced chronic
microvascular ischemic disease again noted. No hydrocephalus.
Basilar cisterns remain patent. No extra-axial fluid collection.

Vascular: No hyperdense vessel. Calcified atherosclerosis noted at
the skull base.

Skull: Scalp soft tissues and calvarium within normal limits.

Sinuses/Orbits: Globes and orbital soft tissues within normal
limits. Small air-fluid level within the left maxillary sinus with
scattered mucosal thickening within the ethmoidal air cells, similar
to previous. Trace right mastoid effusion noted, of doubtful
significance.

Other: None.
IMPRESSION: 1. Slight interval increase in size of right basal ganglia
intraparenchymal hemorrhage, now measuring 9.9 cc in volume,
previously 8.8 cc on 04/04/2018. Associated vasogenic edema mildly
increased without significant regional mass effect.
2. Otherwise stable appearance of the brain with no other new acute
intracranial abnormality.

## 2021-10-05 IMAGING — DX DG ABDOMEN 2V
3 series · 4 of 4 positions shown · non-contrast
Comparison: None.

CLINICAL DATA: Constipation

EXAM:
ABDOMEN - 2 VIEW

[Series 1: abdomen erect · 0.14mm/px · 2 of 2 slices shown]
[im 1/2]
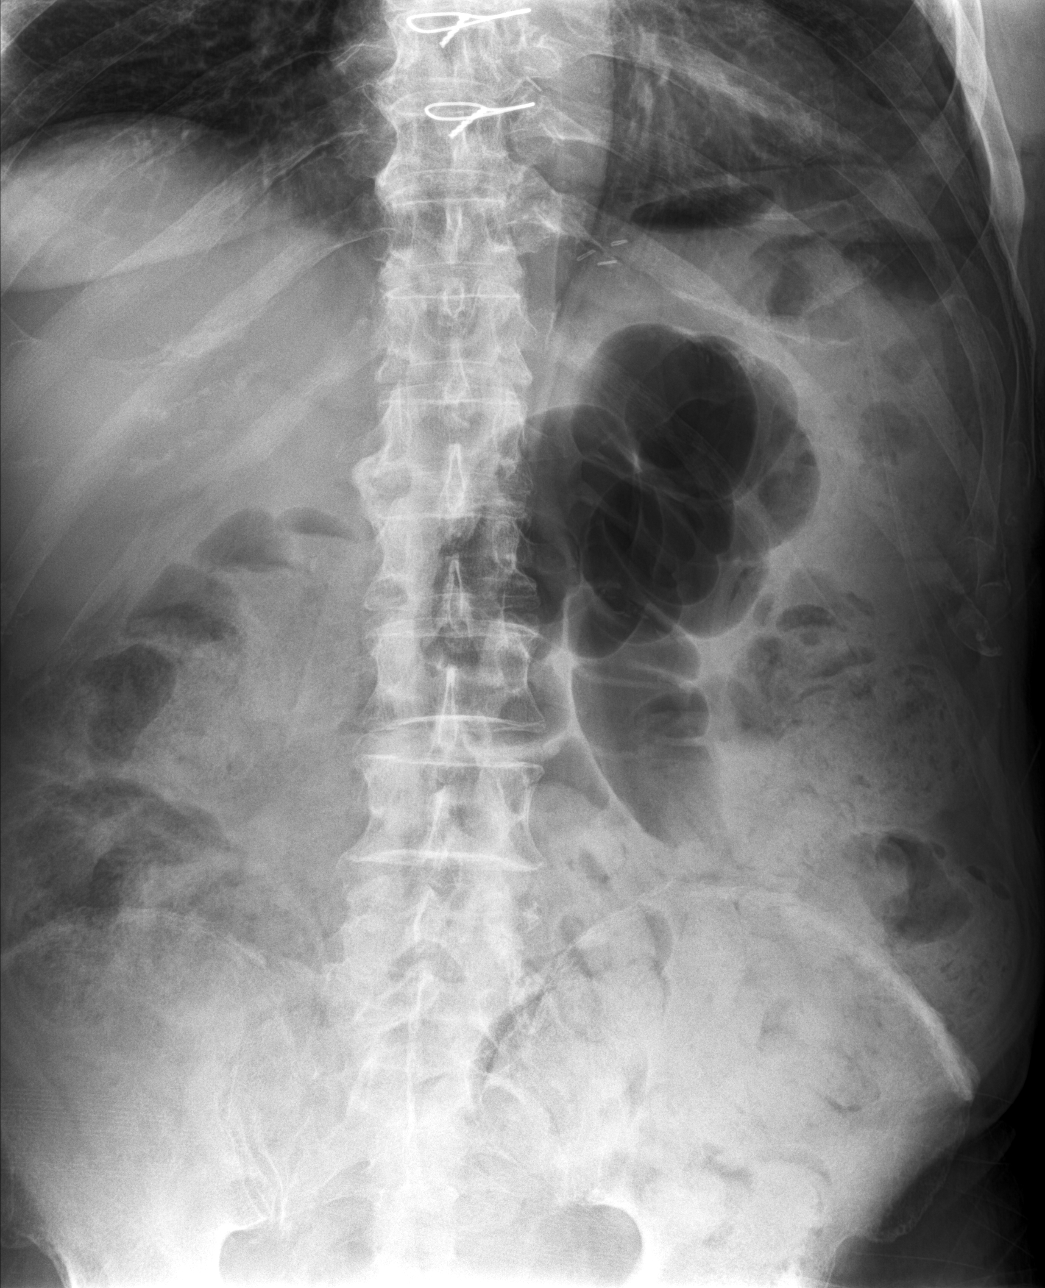
[im 2/2]
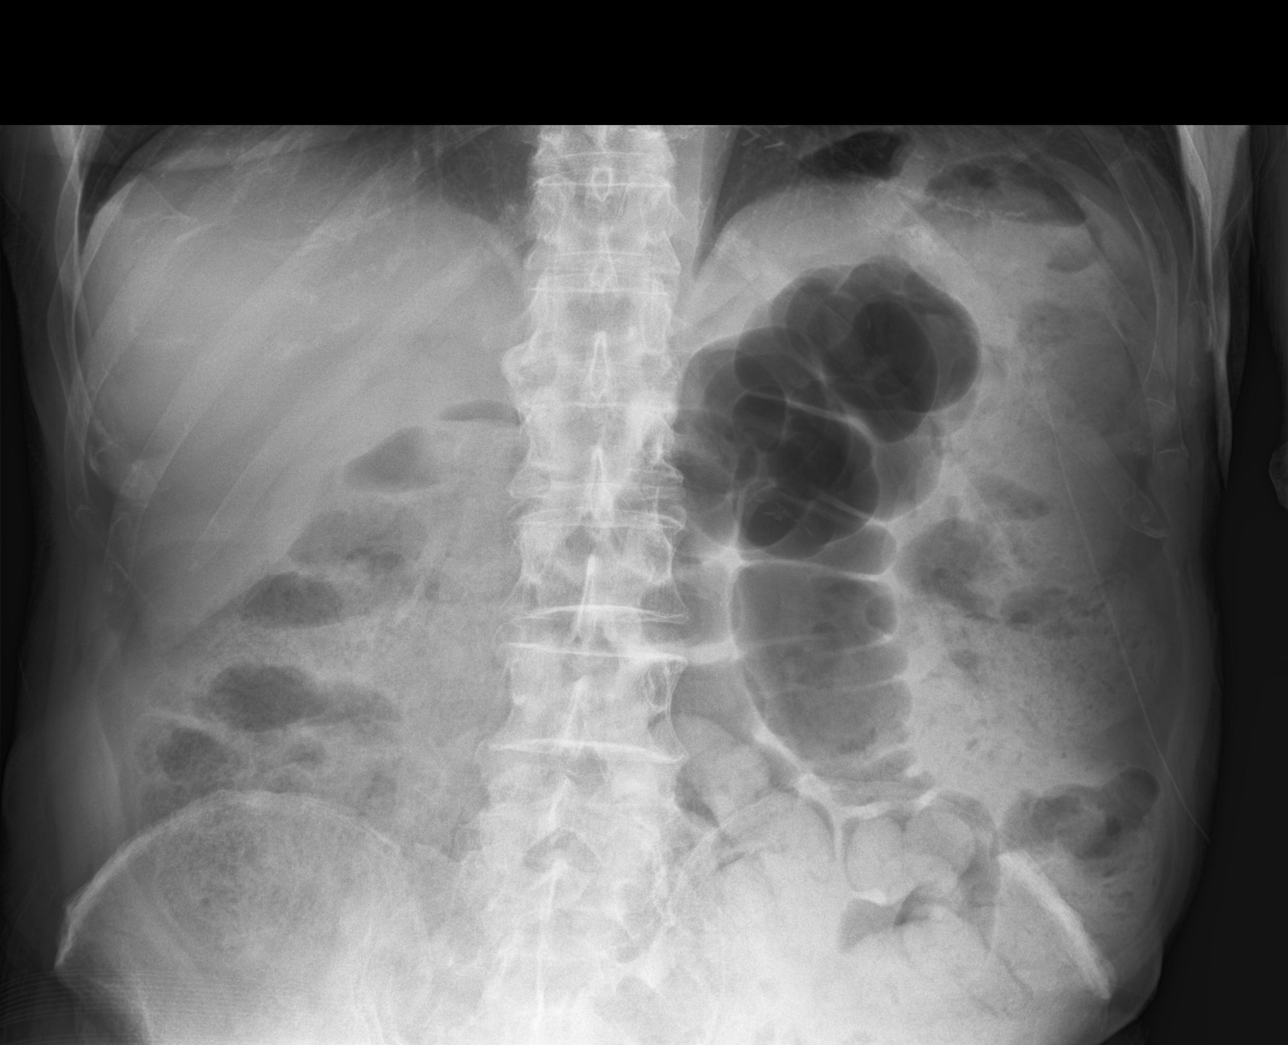

[abdomen supine (1 of 2)]
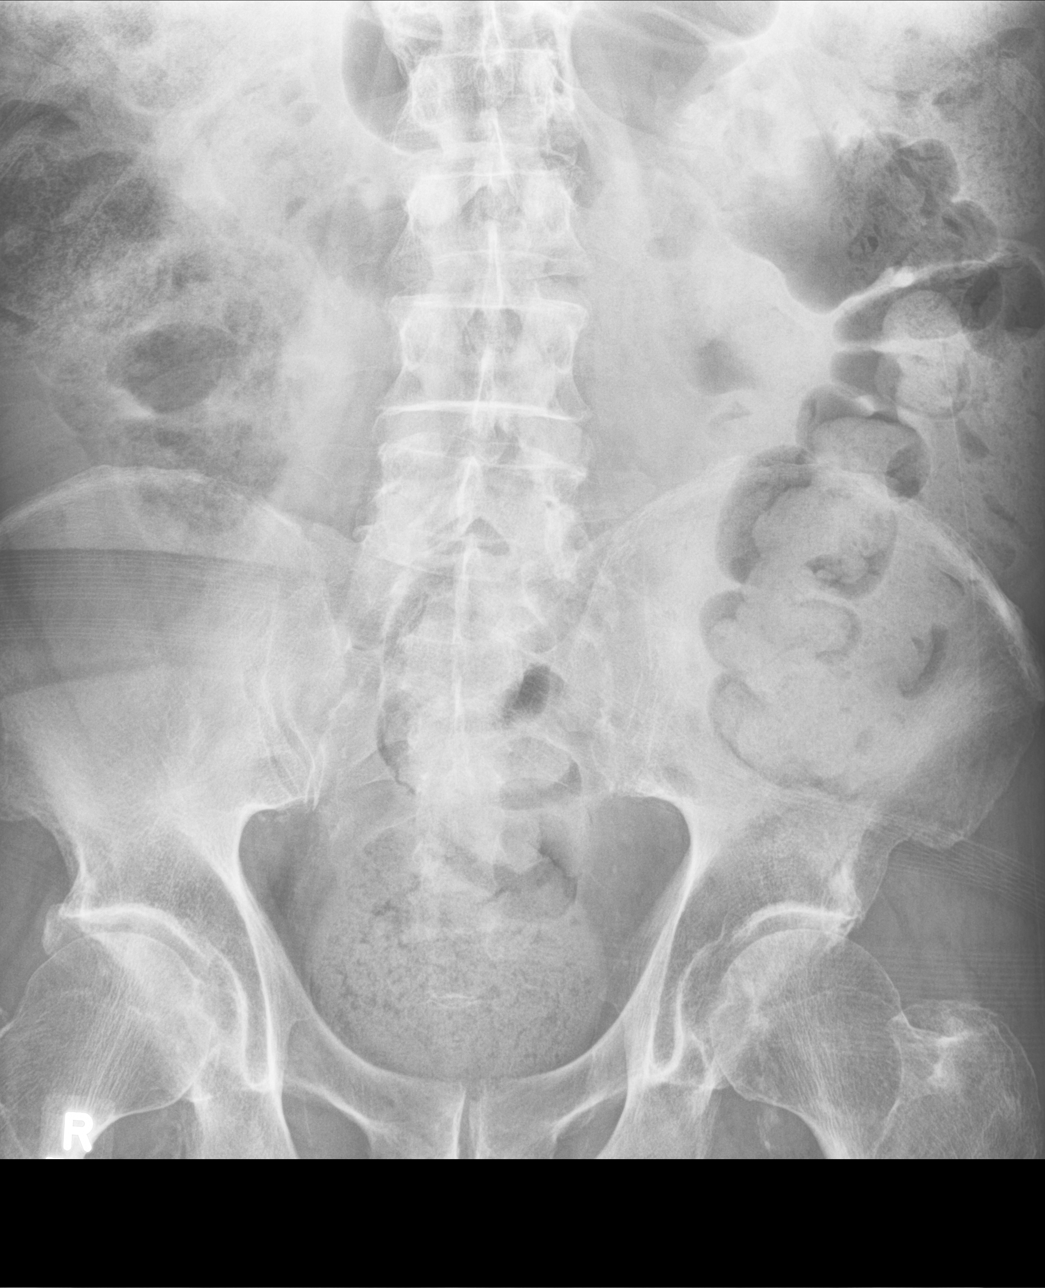

[abdomen supine (2 of 2)]
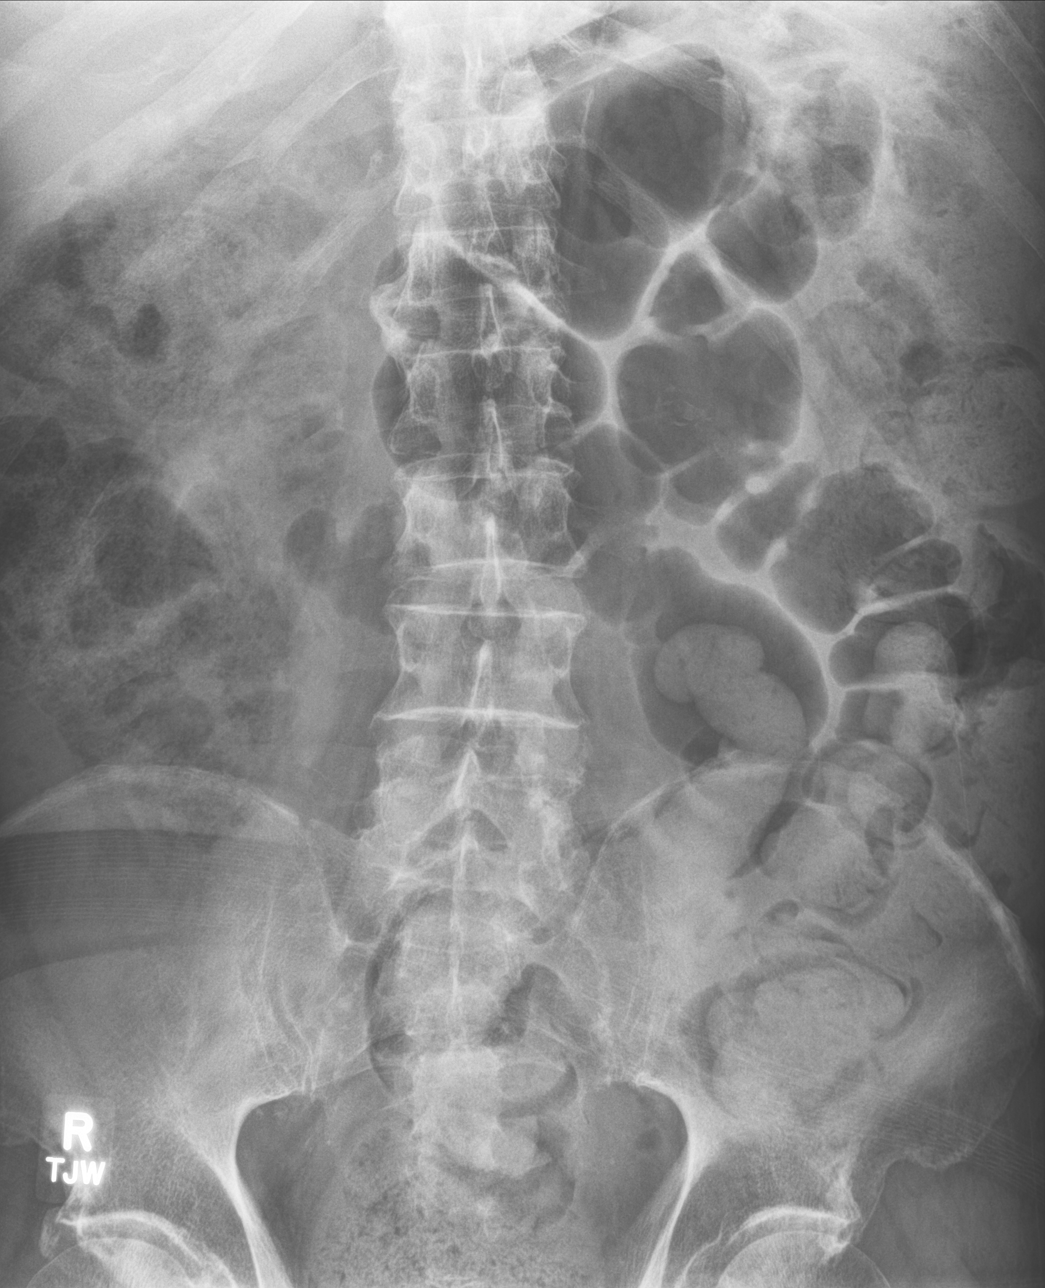

[4 of 4 positions shown; findings below may reference images not displayed]

FINDINGS: There is a large amount of stool throughout the colon. The bowel gas
pattern is nonobstructive. There is a large amount of stool at the
level of the rectum. There are no definite radiopaque kidney stones.
Degenerative changes are noted of both hips and the lumbar spine.
IMPRESSION: Large stool burden.
